# Patient Record
Sex: Male | Born: 1945 | Race: Black or African American | Hispanic: No | Marital: Single | Smoking: Former smoker
Health system: Southern US, Community
[De-identification: ages and names within clinical notes are randomized; demographics above are authoritative.]

## PROBLEM LIST (undated history)

## (undated) DIAGNOSIS — M1990 Unspecified osteoarthritis, unspecified site: Secondary | ICD-10-CM

## (undated) DIAGNOSIS — N186 End stage renal disease: Secondary | ICD-10-CM

## (undated) DIAGNOSIS — M86171 Other acute osteomyelitis, right ankle and foot: Secondary | ICD-10-CM

## (undated) DIAGNOSIS — S98111A Complete traumatic amputation of right great toe, initial encounter: Secondary | ICD-10-CM

## (undated) DIAGNOSIS — E119 Type 2 diabetes mellitus without complications: Secondary | ICD-10-CM

## (undated) DIAGNOSIS — I10 Essential (primary) hypertension: Secondary | ICD-10-CM

## (undated) DIAGNOSIS — Z992 Dependence on renal dialysis: Secondary | ICD-10-CM

## (undated) DIAGNOSIS — E785 Hyperlipidemia, unspecified: Secondary | ICD-10-CM

## (undated) HISTORY — PX: BACK SURGERY: SHX140

## (undated) HISTORY — PX: LUMBAR DISC SURGERY: SHX700

## (undated) HISTORY — DX: Hyperlipidemia, unspecified: E78.5

---
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
## 2004-03-18 ENCOUNTER — Encounter: Admission: RE | Admit: 2004-03-18 | Discharge: 2004-03-18 | Payer: Self-pay | Admitting: Family Medicine

## 2004-05-15 ENCOUNTER — Ambulatory Visit (HOSPITAL_COMMUNITY): Admission: RE | Admit: 2004-05-15 | Discharge: 2004-05-15 | Payer: Self-pay | Admitting: Gastroenterology

## 2005-09-16 ENCOUNTER — Ambulatory Visit (HOSPITAL_COMMUNITY): Admission: RE | Admit: 2005-09-16 | Discharge: 2005-09-17 | Payer: Self-pay | Admitting: Ophthalmology

## 2012-10-17 ENCOUNTER — Inpatient Hospital Stay (HOSPITAL_COMMUNITY): Payer: Medicare Other

## 2012-10-17 ENCOUNTER — Other Ambulatory Visit: Payer: Self-pay

## 2012-10-17 ENCOUNTER — Inpatient Hospital Stay (HOSPITAL_COMMUNITY)
Admission: EM | Admit: 2012-10-17 | Discharge: 2012-10-20 | DRG: 683 | Disposition: A | Discharge status: Home Health | Payer: Medicare Other | Attending: Internal Medicine | Admitting: Internal Medicine

## 2012-10-17 ENCOUNTER — Emergency Department (HOSPITAL_COMMUNITY): Payer: Medicare Other

## 2012-10-17 ENCOUNTER — Encounter (HOSPITAL_COMMUNITY): Payer: Self-pay | Admitting: *Deleted

## 2012-10-17 DIAGNOSIS — R809 Proteinuria, unspecified: Secondary | ICD-10-CM | POA: Diagnosis present

## 2012-10-17 DIAGNOSIS — E119 Type 2 diabetes mellitus without complications: Secondary | ICD-10-CM | POA: Diagnosis present

## 2012-10-17 DIAGNOSIS — E669 Obesity, unspecified: Secondary | ICD-10-CM | POA: Diagnosis present

## 2012-10-17 DIAGNOSIS — E162 Hypoglycemia, unspecified: Secondary | ICD-10-CM

## 2012-10-17 DIAGNOSIS — E1122 Type 2 diabetes mellitus with diabetic chronic kidney disease: Secondary | ICD-10-CM

## 2012-10-17 DIAGNOSIS — Z79899 Other long term (current) drug therapy: Secondary | ICD-10-CM

## 2012-10-17 DIAGNOSIS — N182 Chronic kidney disease, stage 2 (mild): Secondary | ICD-10-CM

## 2012-10-17 DIAGNOSIS — Z23 Encounter for immunization: Secondary | ICD-10-CM

## 2012-10-17 DIAGNOSIS — R4182 Altered mental status, unspecified: Secondary | ICD-10-CM

## 2012-10-17 DIAGNOSIS — I129 Hypertensive chronic kidney disease with stage 1 through stage 4 chronic kidney disease, or unspecified chronic kidney disease: Secondary | ICD-10-CM | POA: Diagnosis present

## 2012-10-17 DIAGNOSIS — N509 Disorder of male genital organs, unspecified: Secondary | ICD-10-CM | POA: Diagnosis not present

## 2012-10-17 DIAGNOSIS — Z6841 Body Mass Index (BMI) 40.0 and over, adult: Secondary | ICD-10-CM

## 2012-10-17 DIAGNOSIS — N183 Chronic kidney disease, stage 3 unspecified: Secondary | ICD-10-CM | POA: Diagnosis present

## 2012-10-17 DIAGNOSIS — F05 Delirium due to known physiological condition: Secondary | ICD-10-CM | POA: Diagnosis present

## 2012-10-17 DIAGNOSIS — N5082 Scrotal pain: Secondary | ICD-10-CM | POA: Diagnosis not present

## 2012-10-17 DIAGNOSIS — Z87891 Personal history of nicotine dependence: Secondary | ICD-10-CM

## 2012-10-17 DIAGNOSIS — N179 Acute kidney failure, unspecified: Principal | ICD-10-CM | POA: Diagnosis present

## 2012-10-17 DIAGNOSIS — Z794 Long term (current) use of insulin: Secondary | ICD-10-CM | POA: Diagnosis present

## 2012-10-17 DIAGNOSIS — R0902 Hypoxemia: Secondary | ICD-10-CM | POA: Diagnosis present

## 2012-10-17 DIAGNOSIS — E1169 Type 2 diabetes mellitus with other specified complication: Secondary | ICD-10-CM | POA: Diagnosis present

## 2012-10-17 DIAGNOSIS — I10 Essential (primary) hypertension: Secondary | ICD-10-CM | POA: Diagnosis present

## 2012-10-17 DIAGNOSIS — E1129 Type 2 diabetes mellitus with other diabetic kidney complication: Secondary | ICD-10-CM

## 2012-10-17 HISTORY — DX: Essential (primary) hypertension: I10

## 2012-10-17 LAB — CBC WITH DIFFERENTIAL/PLATELET
Basophils Relative: 0 % (ref 0–1)
Eosinophils Absolute: 0 10*3/uL (ref 0.0–0.7)
Eosinophils Relative: 0 % (ref 0–5)
HCT: 43.8 % (ref 39.0–52.0)
Hemoglobin: 14.8 g/dL (ref 13.0–17.0)
Lymphocytes Relative: 8 % — ABNORMAL LOW (ref 12–46)
Lymphs Abs: 0.9 10*3/uL (ref 0.7–4.0)
MCHC: 33.8 g/dL (ref 30.0–36.0)
MCV: 81 fL (ref 78.0–100.0)
Monocytes Absolute: 0.5 10*3/uL (ref 0.1–1.0)
RDW: 15.3 % (ref 11.5–15.5)

## 2012-10-17 LAB — URINALYSIS, ROUTINE W REFLEX MICROSCOPIC
Glucose, UA: 100 mg/dL — AB
Leukocytes, UA: NEGATIVE
Protein, ur: >300 mg/dL — AB
Urobilinogen, UA: 0.2 mg/dL (ref 0.0–1.0)

## 2012-10-17 LAB — COMPREHENSIVE METABOLIC PANEL
Alkaline Phosphatase: 139 U/L — ABNORMAL HIGH (ref 39–117)
BUN: 25 mg/dL — ABNORMAL HIGH (ref 6–23)
Calcium: 8.1 mg/dL — ABNORMAL LOW (ref 8.4–10.5)
Creatinine, Ser: 2.32 mg/dL — ABNORMAL HIGH (ref 0.50–1.35)
GFR calc Af Amer: 32 mL/min — ABNORMAL LOW (ref >90)
GFR calc non Af Amer: 28 mL/min — ABNORMAL LOW (ref >90)
Glucose, Bld: 53 mg/dL — ABNORMAL LOW (ref 70–99)
Total Bilirubin: 0.2 mg/dL — ABNORMAL LOW (ref 0.3–1.2)

## 2012-10-17 LAB — LIPASE, BLOOD: Lipase: 16 U/L (ref 11–59)

## 2012-10-17 LAB — GLUCOSE, CAPILLARY
Glucose-Capillary: 95 mg/dL (ref 70–99)
Glucose-Capillary: 111 mg/dL — ABNORMAL HIGH (ref 70–99)
Glucose-Capillary: 115 mg/dL — ABNORMAL HIGH (ref 70–99)
Glucose-Capillary: 133 mg/dL — ABNORMAL HIGH (ref 70–99)
Glucose-Capillary: 124 mg/dL — ABNORMAL HIGH (ref 70–99)

## 2012-10-17 LAB — URINE MICROSCOPIC-ADD ON

## 2012-10-17 LAB — PROTEIN / CREATININE RATIO, URINE
Creatinine, Urine: 76.27 mg/dL
Total Protein, Urine: 705.9 mg/dL

## 2012-10-17 MED ORDER — SODIUM CHLORIDE 0.9 % IV BOLUS (SEPSIS)
1000.0000 mL | Freq: Once | INTRAVENOUS | Status: AC
  Administered 2012-10-17: 1000 mL via INTRAVENOUS

## 2012-10-17 MED ORDER — ONDANSETRON HCL 4 MG/2ML IJ SOLN
4.0000 mg | Freq: Four times a day (QID) | INTRAMUSCULAR | Status: DC | PRN
  Administered 2012-10-18: 4 mg via INTRAVENOUS
  Filled 2012-10-17: qty 2

## 2012-10-17 MED ORDER — DEXTROSE-NACL 5-0.9 % IV SOLN
INTRAVENOUS | Status: DC
  Administered 2012-10-17 – 2012-10-18 (×2): via INTRAVENOUS

## 2012-10-17 MED ORDER — PNEUMOCOCCAL VAC POLYVALENT 25 MCG/0.5ML IJ INJ
0.5000 mL | INJECTION | INTRAMUSCULAR | Status: AC
  Administered 2012-10-18: 0.5 mL via INTRAMUSCULAR
  Filled 2012-10-17: qty 0.5

## 2012-10-17 MED ORDER — DEXTROSE 5 % IV SOLN
Freq: Once | INTRAVENOUS | Status: AC
  Administered 2012-10-17: 11:00:00 via INTRAVENOUS

## 2012-10-17 MED ORDER — INFLUENZA VIRUS VACC SPLIT PF IM SUSP
0.5000 mL | INTRAMUSCULAR | Status: AC
  Administered 2012-10-18: 0.5 mL via INTRAMUSCULAR
  Filled 2012-10-17: qty 0.5

## 2012-10-17 MED ORDER — ACETAMINOPHEN 325 MG PO TABS
650.0000 mg | ORAL_TABLET | Freq: Four times a day (QID) | ORAL | Status: DC | PRN
  Administered 2012-10-19: 650 mg via ORAL
  Filled 2012-10-17: qty 2

## 2012-10-17 MED ORDER — DEXTROSE 5 % IV SOLN: INTRAVENOUS | Status: AC

## 2012-10-17 MED ORDER — ENOXAPARIN SODIUM 40 MG/0.4ML ~~LOC~~ SOLN
40.0000 mg | SUBCUTANEOUS | Status: DC
  Administered 2012-10-17 – 2012-10-19 (×3): 40 mg via SUBCUTANEOUS
  Filled 2012-10-17 (×4): qty 0.4

## 2012-10-17 MED ORDER — ASPIRIN EC 81 MG PO TBEC
81.0000 mg | DELAYED_RELEASE_TABLET | Freq: Every day | ORAL | Status: DC
  Administered 2012-10-17 – 2012-10-20 (×4): 81 mg via ORAL
  Filled 2012-10-17 (×4): qty 1

## 2012-10-17 MED ORDER — SODIUM CHLORIDE 0.9 % IJ SOLN
3.0000 mL | Freq: Two times a day (BID) | INTRAMUSCULAR | Status: DC
  Administered 2012-10-17 – 2012-10-20 (×5): 3 mL via INTRAVENOUS

## 2012-10-17 MED ORDER — ACETAMINOPHEN 650 MG RE SUPP: 650.0000 mg | Freq: Four times a day (QID) | RECTAL | Status: DC | PRN

## 2012-10-17 MED ORDER — DEXTROSE 50 % IV SOLN
INTRAVENOUS | Status: AC
  Administered 2012-10-17: 50 mL
  Filled 2012-10-17: qty 50

## 2012-10-17 MED ORDER — AMLODIPINE BESYLATE 10 MG PO TABS
10.0000 mg | ORAL_TABLET | Freq: Every day | ORAL | Status: DC
  Administered 2012-10-17 – 2012-10-20 (×4): 10 mg via ORAL
  Filled 2012-10-17 (×4): qty 1

## 2012-10-17 MED ORDER — ALLOPURINOL 100 MG PO TABS
100.0000 mg | ORAL_TABLET | Freq: Every day | ORAL | Status: DC
  Administered 2012-10-17 – 2012-10-20 (×4): 100 mg via ORAL
  Filled 2012-10-17 (×4): qty 1

## 2012-10-17 MED ORDER — ONDANSETRON HCL 4 MG PO TABS
4.0000 mg | ORAL_TABLET | Freq: Four times a day (QID) | ORAL | Status: DC | PRN
  Administered 2012-10-20: 4 mg via ORAL
  Filled 2012-10-17: qty 1

## 2012-10-17 MED ORDER — BUPROPION HCL ER (XL) 300 MG PO TB24
300.0000 mg | ORAL_TABLET | Freq: Every day | ORAL | Status: DC
  Administered 2012-10-17 – 2012-10-19 (×3): 300 mg via ORAL
  Filled 2012-10-17 (×4): qty 1

## 2012-10-17 MED ORDER — ATORVASTATIN CALCIUM 20 MG PO TABS
20.0000 mg | ORAL_TABLET | Freq: Every day | ORAL | Status: DC
  Administered 2012-10-17 – 2012-10-20 (×4): 20 mg via ORAL
  Filled 2012-10-17 (×4): qty 1

## 2012-10-17 NOTE — ED Notes (Signed)
CBG 16, Dr. Vanita Panda at bedside. Ordered IV dextrose.

## 2012-10-17 NOTE — ED Notes (Signed)
Checked blood sugar it was 74 notified RN Wes of blood sugar

## 2012-10-17 NOTE — ED Notes (Signed)
Per [facility] report pt has been having generalized weakness for the last week.  Two days ago he has been having N/V.  This am family found him with a decreased LOC and a CBG of 43.  Pt was administered 1mg  of Glucagon due to combativeness.  EMS was then able to obtain a IV line and 25 gms of Dextrose was administered.  His CBG then responded favorably to 107.  Pt is A/ox 4 no distress but appears weak and sleepy.

## 2012-10-17 NOTE — ED Notes (Signed)
Pt a&ox4. Pt in nad. Respirations e/u, skin warm and dry.

## 2012-10-17 NOTE — ED Notes (Signed)
CBG 86. 

## 2012-10-17 NOTE — ED Notes (Signed)
Pt sitting up in bed eating

## 2012-10-17 NOTE — ED Notes (Signed)
CBG 109

## 2012-10-17 NOTE — ED Notes (Signed)
Patient transported to X-ray 

## 2012-10-17 NOTE — ED Notes (Signed)
Pt oxygen sats 89%, pt in nad. Denies sob. Pt placed on 2L/min [place]. O2 sats raised to 94%.

## 2012-10-17 NOTE — Progress Notes (Signed)
Pt voided 200 ml, Bladder scan completed. Greater than 126 ml resulted. MD paged and aware. Will continue to monitor.

## 2012-10-17 NOTE — ED Notes (Signed)
Per Dr. Marye Round - pt has suprapubic tenderness, wants post void residual done once he arrives on the floor upstairs.

## 2012-10-17 NOTE — ED Notes (Addendum)
Pt lethargic, unresponsive to verbal stimuli, Dr. Vanita Panda at bedside. Pt's skin sweaty, cool to touch and more pale than normal. CBG being checked. resp e/u.

## 2012-10-17 NOTE — ED Notes (Signed)
Pt reports he is feeling sleeping. Pt given sandwich and water.

## 2012-10-17 NOTE — ED Notes (Signed)
Pt finished 1/2 of sandwich.

## 2012-10-17 NOTE — H&P (Addendum)
[facility] History and Physical  Blake [name] [id number] DOB: 07/21/1946 DOA: 10/17/2012  Referring physician: ED  PCP: [name], MD    Chief Complaint:  Chief Complaint  Patient presents with  . Hypoglycemia  . Fatigue     HPI: Blake [name] is a 67 y.o. male With diabetes, hypertension was brought from home by EMS with altered mental status. When they arrived at his house he was having severe profound hypoglycemia with confusion and combativeness. He received glucagon IM and was transferred to the emergency room. In the emergency room the patient was found to have renal failure with hypoglycemia which persisted even after administration of D 50. Patient is a diabetic requiring long-acting insulin twice a day and his renal function has slowly deteriorated. He is referred for admission for acute renal failure and hypoglycemia   Review of Systems:  By the time I was going down to the emergency room the patient is awake and he denies any preceding symptoms on the day before the admission The patient denies anorexia, fever, weight loss,, vision loss, decreased hearing, hoarseness, chest pain, syncope, dyspnea on exertion, peripheral edema, balance deficits, hemoptysis, abdominal pain, melena, hematochezia, severe indigestion/heartburn, hematuria, incontinence, genital sores, muscle weakness, suspicious skin lesions, transient blindness, difficulty walking, depression, unusual weight change, abnormal bleeding, enlarged lymph nodes, angioedema, and breast masses.    Past Medical History  Diagnosis Date  . Diabetes mellitus without complication   . Hypertension    Past Surgical History  Procedure Date  . Back surgery    Social History:  reports that he has quit smoking. His smoking use included Cigarettes. He quit after 5 years of use. He has never used smokeless tobacco. He reports that he does not drink alcohol or use illicit drugs. Lives at home  independently  No Known Allergies  Family History  Problem Relation Age of Onset  . Diabetes Father     Prior to Admission medications   Medication Sig Start Date End Date Taking? Authorizing Provider  allopurinol (ZYLOPRIM) 300 MG tablet Take 300 mg by mouth daily.   Yes Historical Provider, MD  amLODipine (NORVASC) 10 MG tablet Take 10 mg by mouth daily.   Yes Historical Provider, MD  aspirin EC 81 MG tablet Take 81 mg by mouth daily.   Yes Historical Provider, MD  atorvastatin (LIPITOR) 20 MG tablet Take 20 mg by mouth daily.   Yes Historical Provider, MD  bisoprolol (ZEBETA) 10 MG tablet Take 10 mg by mouth daily.   Yes Historical Provider, MD  buPROPion (WELLBUTRIN XL) 150 MG 24 hr tablet Take 300 mg by mouth daily.   Yes Historical Provider, MD  furosemide (LASIX) 40 MG tablet Take 40 mg by mouth 2 (two) times daily.   Yes Historical Provider, MD  insulin glargine (LANTUS) 100 UNIT/ML injection Inject 20-70 Units into the skin 2 (two) times daily. 20 units every morning and 70 units at bedtime   Yes Historical Provider, MD  insulin lispro (HUMALOG) 100 UNIT/ML injection Inject 5 Units into the skin 3 (three) times daily before meals.   Yes Historical Provider, MD  lisinopril (PRINIVIL,ZESTRIL) 30 MG tablet Take 30 mg by mouth daily.   Yes Historical Provider, MD   Physical Exam: Filed Vitals:   10/17/12 1200 10/17/12 1230 10/17/12 1315 10/17/12 1450  BP: 144/83 135/78 104/55 165/84  Pulse: 53 56 67 69  Temp:    98 F (36.7 C)  TempSrc:    Oral  Resp: 20 16  17 18  SpO2: 97% 99% 97% 100%     General:  Alert and oriented x3  Eyes: Pupil equal round react to light accommodation, extraocular movement is intact  ENT: Clear pharynx without exudates  Neck: No jugular venous distention  Cardiovascular: Regular rate and rhythm without murmurs rubs or gallops  Respiratory: Clear to auscultation bilaterally without wheezes rhonchi crackles  Abdomen: Obese soft, patient has  suprapubic tenderness  Skin: No suspicious looking rashes  Musculoskeletal: Intact muscle bulk and tone  Psychiatric: Euthymic  Neurologic: Cranial nerves 2-12 intact, strength 5 out of 5 in all 4 extremities, sensation intact  Labs on Admission:  Basic Metabolic Panel:  Lab [id number] 0814  NA 138  K 3.5  CL 105  CO2 23  GLUCOSE 53*  BUN 25*  CREATININE 2.32*  CALCIUM 8.1*  MG --  PHOS --   Liver Function Tests:  Lab 10/17/12 0814  AST 21  ALT 12  ALKPHOS 139*  BILITOT 0.2*  PROT 6.5  ALBUMIN 2.2*    Lab 10/17/12 0814  LIPASE 16  AMYLASE --   No results found for this basename: AMMONIA:5 in the last 168 hours CBC:  Lab 10/17/12 0814  WBC 10.5  NEUTROABS 9.1*  HGB 14.8  HCT 43.8  MCV 81.0  PLT 249   Cardiac Enzymes: No results found for this basename: CKTOTAL:5,CKMB:5,CKMBINDEX:5,TROPONINI:5 in the last 168 hours  BNP (last 3 results) No results found for this basename: PROBNP:3 in the last 8760 hours CBG:  Lab 10/17/12 1623 10/17/12 1538 10/17/12 1456 10/17/12 1412 10/17/12 1215  GLUCAP 115* 111* 95 110* 109*    Radiological Exams on Admission: Dg Chest 2 View  10/17/2012  *RADIOLOGY REPORT*  Clinical Data: Discomfort, low blood sugar  CHEST - 2 VIEW  Comparison: 09/16/2005  Findings: Limited inspiratory effect.  Even allowing for this, there does appear to be interval increase in cardiac size with currently mild to moderate cardiac silhouette enlargement.  There is indistinct perihilar vasculature with vascular congestion and mild interstitial process bilaterally.  No consolidation or pleural effusion.  IMPRESSION: Cardiomegaly versus pericardial effusion.  Findings suggest mild pulmonary edema.   Original Report Authenticated By: Skipper Cliche, M.D.     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 Acute acute renal  failure versus progression of chronic kidney disease. The patient also on ACE inhibitors prior to admission. No clinical concern for dehydration. We'll need to establish baseline creatinine which could be done from the primary care physician office tomorrow. We need to obtain protein/creatinine ratio to quantify the degree of proteinuria. To obtain a postvoid residual to rule out urinary retention. We'll obtain a renal ultrasound to look for the morphology of the kidneys. We will hold the ACE inhibitors, give gental hydration And recheck creatinine tomorrow 2. Severe hypoglycemia in the setting of long acting insulin and worsening renal failure. Plan for IV dextrose until glucose is more than 200. Monitor CBGs every 2 hours. To resume long-acting insulin at a lower dose2/5/14 3. Hypertension - We will continue amlodipine. Patient is very bradycardic so will stop Zebeta for now. We will stop ACE inhibitors as discussed above 4. Hypoxia and vascular congestion on chest x-ray - suspect measurements and chest x-ray were done at the time of altered mental status for from profound hypoglycemia. I do suspect the patient's body habitus created a situation of  poor expansion of the lungs. The patient does not have clinical pulmonary edema. The patient has no respiratory distress. We will observe and repeat oxygen saturation on room air tomorrow   Time spent: 45 minutes  Faiga Stones [facility] Pager [phone]  If 7PM-7AM, please contact night-coverage [email or web address] Password [facility] 10/17/2012, 5:41 PM

## 2012-10-17 NOTE — ED Provider Notes (Signed)
History     CSN: [id number]  Arrival date & time 10/17/12  0804   First MD Initiated Contact with Patient 10/17/12 [phone]      Chief Complaint  Patient presents with  . Hypoglycemia  . Fatigue    (Consider location/radiation/quality/duration/timing/severity/associated sxs/prior treatment) HPI  Patient complains of weakness, generalized fatigue.  He states that his symptoms began yesterday, without clear precipitant.  He states that he has been compliant with all medication, with no dosage or frequency changes recently. He denies focal pain.  He states that he has intermittent lightheadedness, but no syncope, no chest pain, no dyspnea, no vomiting. This is different from EMS report.  Per EMS family reports that over the past week patient has had generalized weakness, with occasional episodes of emesis. Per EMS the patient was found listless, with a CBG of 43 on their arrival.  Patient seemed to respond to dextrose.   Past Medical History  Diagnosis Date  . Diabetes mellitus without complication   . Hypertension     No past surgical history on file.  No family history on file.  History  Substance Use Topics  . Smoking status: Not on file  . Smokeless tobacco: Not on file  . Alcohol Use:       Review of Systems  Constitutional:       Per HPI, otherwise negative  HENT:       Per HPI, otherwise negative  Eyes: Negative.   Respiratory:       Per HPI, otherwise negative  Cardiovascular:       Per HPI, otherwise negative  Gastrointestinal: Negative for vomiting.  Genitourinary: Negative.   Musculoskeletal:       Per HPI, otherwise negative  Skin: Negative.   Neurological: Negative for syncope.    Allergies  Review of patient's allergies indicates no known allergies.  Home Medications   Current Outpatient Rx  Name  Route  Sig  Dispense  Refill  . ALLOPURINOL 300 MG PO TABS   Oral   Take 300 mg by mouth daily.         Marland Kitchen AMLODIPINE BESYLATE 10 MG PO TABS  Oral   Take 10 mg by mouth daily.         . ASPIRIN EC 81 MG PO TBEC   Oral   Take 81 mg by mouth daily.         . ATORVASTATIN CALCIUM 20 MG PO TABS   Oral   Take 20 mg by mouth daily.         Marland Kitchen BISOPROLOL FUMARATE 10 MG PO TABS   Oral   Take 10 mg by mouth daily.         . BUPROPION HCL ER (XL) 150 MG PO TB24   Oral   Take 300 mg by mouth daily.         . FUROSEMIDE 40 MG PO TABS   Oral   Take 40 mg by mouth 2 (two) times daily.         . INSULIN GLARGINE 100 UNIT/ML [place] SOLN   Subcutaneous   Inject 20-70 Units into the skin 2 (two) times daily. 20 units every morning and 70 units at bedtime         . INSULIN LISPRO (HUMAN) 100 UNIT/ML [place] SOLN   Subcutaneous   Inject 5 Units into the skin 3 (three) times daily before meals.         Marland Kitchen LISINOPRIL 30 MG PO TABS  Oral   Take 30 mg by mouth daily.           BP 144/75  Pulse 63  Temp 97.8 F (36.6 C) (Oral)  Resp 18  SpO2 98%  Physical Exam  Nursing note and vitals reviewed. Constitutional: He is oriented to person, place, and time. He appears well-developed. No distress.  HENT:  Head: Normocephalic and atraumatic.  Eyes: Conjunctivae normal and EOM are normal.  Cardiovascular: Normal rate and regular rhythm.   Pulmonary/Chest: Effort normal. No stridor. No respiratory distress.  Abdominal: He exhibits no distension.  Musculoskeletal: He exhibits no edema.  Neurological: He is alert and oriented to person, place, and time.  Skin: Skin is warm and dry.  Psychiatric: He has a normal mood and affect.    ED Course  Procedures (including critical care time)   With initial consideration of sepsis versus metabolic abnormalities, patient was resuscitated with IV fluids after initial evaluation.   Labs Reviewed  GLUCOSE, CAPILLARY  CBC WITH DIFFERENTIAL  COMPREHENSIVE METABOLIC PANEL  LIPASE, BLOOD  URINALYSIS, ROUTINE W REFLEX MICROSCOPIC  TROPONIN I   No results found.   No diagnosis  found.  Cardiac 65 sinus rhythm normal Pulse ox 99% room air normal   Date: 10/17/2012  Rate: 66  Rhythm: normal sinus rhythm  QRS Axis: normal  Intervals: normal  ST/T Wave abnormalities: normal  Conduction Disutrbances: none  Narrative Interpretation: unremarkable   Update: Patient sitting upright, eating   10:45 AM Patient stupporous.  CBG 16 - Dextrose ordered.  Drip starting  Initial labs demonstrate new renal failure.  11:00 AM Patient more awake. He denies renal failure, but states that he has been evaluated for this previously.  MDM  This patient with insulin-dependent diabetes presents after family members noticed increasing listlessness, altered mental status in the past few days, the patient only describes complaint over the past day.  On EMS arrival the patient had hypoglycemia.  After initial evaluation here, the patient had an additional episode of stupor, and hypoglycemia.  Given the altered mental status, hypoglycemia, acute renal failure, after the patient was initially resuscitated with IV fluids, he also received dextrose infusion continuously.  With clinical improvement here following dextrose, the patient was admitted to telemetry for further evaluation, monitoring of new renal failure, and new altered mental status with hypoglycemia, with suspicion of the patient's renal dysfunction, continued consistent insulin dosing, decreased by mouth intake all as contributory factors to his episodes.   CRITICAL CARE Performed by: Carmin Muskrat   Total critical care time: 35  Critical care time was exclusive of separately billable procedures and treating other patients.  Critical care was necessary to treat or prevent imminent or life-threatening deterioration.  Critical care was time spent personally by me on the following activities: development of treatment plan with patient and/or surrogate as well as nursing, discussions with consultants, evaluation of  patient's response to treatment, examination of patient, obtaining history from patient or surrogate, ordering and performing treatments and interventions, ordering and review of laboratory studies, ordering and review of radiographic studies, pulse oximetry and re-evaluation of patient's condition.    Carmin Muskrat, MD 10/17/12 1122

## 2012-10-18 DIAGNOSIS — R0902 Hypoxemia: Secondary | ICD-10-CM

## 2012-10-18 DIAGNOSIS — E162 Hypoglycemia, unspecified: Secondary | ICD-10-CM

## 2012-10-18 LAB — GLUCOSE, CAPILLARY
Glucose-Capillary: 107 mg/dL — ABNORMAL HIGH (ref 70–99)
Glucose-Capillary: 84 mg/dL (ref 70–99)
Glucose-Capillary: 53 mg/dL — ABNORMAL LOW (ref 70–99)
Glucose-Capillary: 105 mg/dL — ABNORMAL HIGH (ref 70–99)
Glucose-Capillary: 255 mg/dL — ABNORMAL HIGH (ref 70–99)
Glucose-Capillary: 265 mg/dL — ABNORMAL HIGH (ref 70–99)
Glucose-Capillary: 221 mg/dL — ABNORMAL HIGH (ref 70–99)
Glucose-Capillary: 183 mg/dL — ABNORMAL HIGH (ref 70–99)

## 2012-10-18 LAB — CBC
Hemoglobin: 12.9 g/dL — ABNORMAL LOW (ref 13.0–17.0)
Platelets: 227 10*3/uL (ref 150–400)
RBC: 4.89 MIL/uL (ref 4.22–5.81)
WBC: 8.6 10*3/uL (ref 4.0–10.5)

## 2012-10-18 LAB — BASIC METABOLIC PANEL WITH GFR
BUN: 27 mg/dL — ABNORMAL HIGH (ref 6–23)
CO2: 25 meq/L (ref 19–32)
Calcium: 7.7 mg/dL — ABNORMAL LOW (ref 8.4–10.5)
Chloride: 109 meq/L (ref 96–112)
Creatinine, Ser: 2.47 mg/dL — ABNORMAL HIGH (ref 0.50–1.35)
GFR calc Af Amer: 30 mL/min — ABNORMAL LOW
GFR calc non Af Amer: 26 mL/min — ABNORMAL LOW
Glucose, Bld: 73 mg/dL (ref 70–99)
Potassium: 3.5 meq/L (ref 3.5–5.1)
Sodium: 143 meq/L (ref 135–145)

## 2012-10-18 LAB — URINE CULTURE
Colony Count: NO GROWTH
Culture: NO GROWTH

## 2012-10-18 MED ORDER — GI COCKTAIL ~~LOC~~
30.0000 mL | Freq: Once | ORAL | Status: AC
  Administered 2012-10-18: 30 mL via ORAL
  Filled 2012-10-18 (×2): qty 30

## 2012-10-18 MED ORDER — INSULIN ASPART 100 UNIT/ML ~~LOC~~ SOLN: 0.0000 [IU] | Freq: Every day | SUBCUTANEOUS | Status: DC

## 2012-10-18 MED ORDER — INSULIN ASPART 100 UNIT/ML ~~LOC~~ SOLN
0.0000 [IU] | Freq: Three times a day (TID) | SUBCUTANEOUS | Status: DC
  Administered 2012-10-18: 3 [IU] via SUBCUTANEOUS
  Administered 2012-10-19 (×2): 2 [IU] via SUBCUTANEOUS
  Administered 2012-10-19: 3 [IU] via SUBCUTANEOUS
  Administered 2012-10-20 (×3): 2 [IU] via SUBCUTANEOUS

## 2012-10-18 NOTE — Clinical Documentation Improvement (Signed)
BMI DOCUMENTATION CLARIFICATION QUERY  THIS DOCUMENT IS NOT A PERMANENT PART OF THE MEDICAL RECORD  TO RESPOND TO THE THIS QUERY, FOLLOW THE INSTRUCTIONS BELOW:  1. If needed, update documentation for the patient's encounter via the notes activity.  2. Access this query again and click edit on the [employer] [place] [place].  3. After updating, or not, click F2 to complete all highlighted (required) fields concerning your review. Select "additional documentation in the medical record" OR "no additional documentation provided".  4. Click Sign note button.  5. The deficiency will fall out of your In Basket *Please let us know if you are not able to complete this workflow by phone or e-mail (listed below).         10/18/12  Dear Dr. Algis Liming Rolley Sims  In an effort to better capture your patient's severity of illness, reflect appropriate length of stay and utilization of resources, a review of the patient medical record has revealed the following indicators.    Based on your clinical judgment, please clarify and document in a progress note and/or discharge summary the clinical condition associated with the following supporting information:  In responding to this query please exercise your independent judgment.  The fact that a query is asked, does not imply that any particular answer is desired or expected.   Possible Clinical conditions:  Morbid Obesity W/ BMI= 41.8  Other condition  Cannot Clinically determine     Sign & Symptoms: Weight: 274lb,8oz Height: 5'8" BMI= 41.8   Reviewed: additional documentation in the medical record  Thank You,  Theron Arista,  Clinical Documentation Specialist:  Pager: [phone]  Phone: [place]

## 2012-10-18 NOTE — Progress Notes (Signed)
[facility] PROGRESS NOTE  Blake [name] [id number] DOB: 17-Jan-1946 DOA: 10/17/2012 PCP: [name], MD  Brief narrative 67 y.o. male with diabetes, hypertension was brought from home by EMS with altered mental status. When they arrived at his house he was having severe profound hypoglycemia with confusion and combativeness. He received glucagon IM and was transferred to the emergency room. In the emergency room the patient was found to have renal failure with hypoglycemia which persisted even after administration of D 50. Patient is a diabetic requiring long-acting insulin twice a day and his renal function has slowly deteriorated. He is referred for admission for acute renal failure and hypoglycemia  Assessment/Plan: 1. Hypoglycemia in patient with insulin-dependent DM 2:? Secondary to declining renal function. Patient gives history of symptomatic hypoglycemia ongoing for a long time but has not been checking CBGs for at least 2 years due to financial constraints. He does however takes his insulins and eats something sweet such as candy when he has symptoms. Insulins were held, patient was based on IV dextrose water-his CBGs are starting to rise. DC D5W. Continue to monitor CBCs closely and start sensitive sliding scale insulin and monitor closely. Resume Lantus at lower dose when blood sugars stable and not dropping. 2. Acute on chronic kidney disease versus chronic kidney disease stage III: Creatinine not significantly changed from yesterday. We'll attempt to obtain results from PCP/nephrologist. Patient has seen [facility]. No hydronephrosis. 3. Hypertension: Fluctuating and mildly uncontrolled. Continue amlodipine. Zepeda held secondary to bradycardia. ACE inhibitors also held secondary to problem #2. 4. Hypoxemia: Possibly secondary to altered mental status and poor effort. Monitor. May need to rule out OSA as outpatient.  Code Status: Full Family  Communication: Discussed with patient. Disposition Plan: Home when medically stable.   Consultants:  None  Procedures:  None  Antibiotics:  None   HPI/Subjective: Feels much better. Gives long history of periodic symptoms suggestive of hypoglycemia.  Objective: Filed Vitals:   10/17/12 2017 10/18/12 0518 10/18/12 1037 10/18/12 1436  BP: 166/74 159/78 132/60 149/64  Pulse: 66 68 70 92  Temp: 98.2 F (36.8 C) 97.8 F (36.6 C)  98.2 F (36.8 C)  TempSrc: Oral Oral  Oral  Resp: 16 17  20   Height:      Weight:      SpO2: 97% 98%  100%    Intake/Output Summary (Last 24 hours) at 10/18/12 1704 Last data filed at 10/18/12 1300  Gross per 24 hour  Intake   1320 ml  Output   1750 ml  Net   -430 ml   Filed Weights   10/17/12 1700  Weight: 124.512 kg (274 lb 8 oz)    Exam:   General exam: Obese male lying comfortably in bed.  Respiratory system: Clear. No increased work of breathing.  Cardiovascular system: S1 and S2 heard, RRR. Telemetry shows sinus rhythm. No JVD or murmurs. Trace bilateral ankle edema.  Gastrointestinal system: Abdomen is obese, soft and nontender. Normal bowel sounds heard.  Central nervous system: Alert and oriented. No focal neurological deficits  Extremities: Symmetric 5 x 5 power.   Data Reviewed: Basic Metabolic Panel:  Lab [id number] 0520 10/17/12 0814  NA 143 138  K 3.5 3.5  CL 109 105  CO2 25 23  GLUCOSE 73 53*  BUN 27* 25*  CREATININE 2.47* 2.32*  CALCIUM 7.7* 8.1*  MG -- --  PHOS -- --   Liver Function Tests:  Lab 10/17/12 0814  AST 21  ALT 12  ALKPHOS 139*  BILITOT 0.2*  PROT 6.5  ALBUMIN 2.2*    Lab 10/17/12 0814  LIPASE 16  AMYLASE --   No results found for this basename: AMMONIA:5 in the last 168 hours CBC:  Lab 10/18/12 0520 10/17/12 0814  WBC 8.6 10.5  NEUTROABS -- 9.1*  HGB 12.9* 14.8  HCT 39.8 43.8  MCV 81.4 81.0  PLT 227 249   Cardiac Enzymes:  Lab 10/17/12 2212  CKTOTAL --  CKMB --   CKMBINDEX --  TROPONINI <0.30   BNP (last 3 results) No results found for this basename: PROBNP:3 in the last 8760 hours CBG:  Lab 10/18/12 1150 10/18/12 1000 10/18/12 0823 10/18/12 0704 10/18/12 0647  GLUCAP 265* 255* 135* 105* 115*    Recent Results (from the past 240 hour(s))  URINE CULTURE     Status: Normal   Collection Time   10/17/12  8:28 AM      Component Value Range Status Comment   Specimen Description URINE, RANDOM   Final    Special Requests ADDED [id number]   Final    Culture  Setup Time 10/17/2012 12:14   Final    [place] Count NO GROWTH   Final    Culture NO GROWTH   Final    Report Status 10/18/2012 FINAL   Final      Studies: Dg Chest 2 View  10/17/2012  *RADIOLOGY REPORT*  Clinical Data: Discomfort, low blood sugar  CHEST - 2 VIEW  Comparison: 09/16/2005  Findings: Limited inspiratory effect.  Even allowing for this, there does appear to be interval increase in cardiac size with currently mild to moderate cardiac silhouette enlargement.  There is indistinct perihilar vasculature with vascular congestion and mild interstitial process bilaterally.  No consolidation or pleural effusion.  IMPRESSION: Cardiomegaly versus pericardial effusion.  Findings suggest mild pulmonary edema.   Original Report Authenticated By: Skipper Cliche, M.D.    US Renal  10/17/2012  *RADIOLOGY REPORT*  Clinical Data: Renal failure.  Diabetic hypertensive patient.  RENAL/URINARY TRACT ULTRASOUND COMPLETE  Comparison:  None.  Findings:  Right Kidney:  11.1 cm. No hydronephrosis or renal mass.  Left Kidney:  11.8 cm. No hydronephrosis or renal mass.  Bladder:  No gross abnormality.  IMPRESSION: No hydronephrosis.  Dromedary configuration noted bilaterally.   Original Report Authenticated By: Genia Del, M.D.     Scheduled Meds:    . allopurinol  100 mg Oral Daily  . amLODipine  10 mg Oral Daily  . aspirin EC  81 mg Oral Daily  . atorvastatin  20 mg Oral Daily  . buPROPion  300 mg Oral  Daily  . enoxaparin (LOVENOX) injection  40 mg Subcutaneous Q24H  . influenza  inactive virus vaccine  0.5 mL Intramuscular Tomorrow-1000  . pneumococcal 23 valent vaccine  0.5 mL Intramuscular Tomorrow-1000  . 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 If 8PM-8AM, please contact night-coverage at [email or web address], password [email or web address] 10/18/2012, 5:04 PM  LOS: 1 day

## 2012-10-18 NOTE — Clinical Documentation Improvement (Signed)
CHANGE MENTAL STATUS DOCUMENTATION CLARIFICATION   THIS DOCUMENT IS NOT A PERMANENT PART OF THE MEDICAL RECORD  TO RESPOND TO THE THIS QUERY, FOLLOW THE INSTRUCTIONS BELOW:  1. If needed, update documentation for the patient's encounter via the notes activity.  2. Access this query again and click edit on the In [place].  3. After updating, or not, click F2 to complete all highlighted (required) fields concerning your review. Select "additional documentation in the medical record" OR "no additional documentation provided".  4. Click Sign note button.  5. The deficiency will fall out of your In Basket *Please let us know if you are not able to complete this workflow by phone or e-mail (listed below).         10/18/12  Dear Dr. Algis Liming Rolley Sims  In an effort to better capture your patient's severity of illness, reflect appropriate length of stay and utilization of resources, a review of the patient medical record has revealed the following indicators.    Based on your clinical judgment, please clarify and document in a progress note and/or discharge summary the clinical condition associated with the following supporting information:  In responding to this query please exercise your independent judgment.  The fact that a query is asked, does not imply that any particular answer is desired or expected.   Possible Clinical Conditions?  ___Encephalopathy (describe type if known)                       Anoxic                       Septic                       Alcoholic                        Hepatic                       Hypertensive                       Metabolic                       Toxic  ___Drug induced confusion/delirium ___Acute confusion ___Acute delirium ___Acute exacerbation of known dementia (indicate type) ___New diagnosis of Dementia, Alzheimer's, cerebral atherosclerosis  ___Other Condition ___Cannot Clinically Determine     Risk Factors: AMS, confusion,  combativeness noted per 02/04 progress notes.   Reviewed: additional documentation in the medical record  Thank Bertram Gala  Clinical Documentation Specialist:  Pager: [phone]  Phone: [place]

## 2012-10-18 NOTE — Progress Notes (Signed)
Inpatient Diabetes Program Recommendations  AACE/ADA: New Consensus Statement on Inpatient Glycemic Control (2013)  Target Ranges:  Prepandial:   less than 140 mg/dL      Peak postprandial:   less than 180 mg/dL (1-2 hours)      Critically ill patients:  140 - 180 mg/dL   Admitted with AMS/Hypoglycemia/Acute renal failure.  Takes the following insulin at home: Lantus 20 units in the AM/ 70 units in the PM Humalog 5 units tid with meals  Patient may need a reduction of his long-acting insulin for home if he continues to have issues with his renal function.  Will be at higher risk for hypoglycemia with elevated creatinine.  Noted CBGs now on the rise as of 12noon (CBG was 265 mg/dl).  May want to add Novolog Sensitive correction scale (SSI). Please change diet to Carbohydrate Modified diet (currently has Regular diet with no carbohydrate restrictions).  Will follow. Wyn Quaker RN, MSN, CDE Diabetes Coordinator Inpatient Diabetes Program [phone]

## 2012-10-18 NOTE — Progress Notes (Signed)
Utilization Review Completed.Blake [name] T2/01/2013   

## 2012-10-19 ENCOUNTER — Inpatient Hospital Stay (HOSPITAL_COMMUNITY): Payer: Medicare Other

## 2012-10-19 DIAGNOSIS — N509 Disorder of male genital organs, unspecified: Secondary | ICD-10-CM

## 2012-10-19 DIAGNOSIS — N5082 Scrotal pain: Secondary | ICD-10-CM | POA: Diagnosis not present

## 2012-10-19 DIAGNOSIS — N183 Chronic kidney disease, stage 3 unspecified: Secondary | ICD-10-CM

## 2012-10-19 LAB — GLUCOSE, CAPILLARY: Glucose-Capillary: 159 mg/dL — ABNORMAL HIGH (ref 70–99)

## 2012-10-19 LAB — BASIC METABOLIC PANEL
CO2: 26 mEq/L (ref 19–32)
Calcium: 7.5 mg/dL — ABNORMAL LOW (ref 8.4–10.5)
Creatinine, Ser: 2.43 mg/dL — ABNORMAL HIGH (ref 0.50–1.35)
GFR calc Af Amer: 30 mL/min — ABNORMAL LOW (ref >90)
Sodium: 141 mEq/L (ref 135–145)

## 2012-10-19 MED ORDER — HYDROMORPHONE HCL PF 1 MG/ML IJ SOLN: 0.5000 mg | INTRAMUSCULAR | Status: DC | PRN

## 2012-10-19 MED ORDER — PANTOPRAZOLE SODIUM 40 MG PO TBEC
40.0000 mg | DELAYED_RELEASE_TABLET | Freq: Every day | ORAL | Status: DC
  Administered 2012-10-19 – 2012-10-20 (×2): 40 mg via ORAL
  Filled 2012-10-19 (×2): qty 1

## 2012-10-19 MED ORDER — ALUM & MAG HYDROXIDE-SIMETH 200-200-20 MG/5ML PO SUSP
15.0000 mL | Freq: Four times a day (QID) | ORAL | Status: DC | PRN
  Administered 2012-10-19: 15 mL via ORAL
  Filled 2012-10-19 (×2): qty 30

## 2012-10-19 MED ORDER — HYDROCODONE-ACETAMINOPHEN 5-325 MG PO TABS
1.0000 | ORAL_TABLET | Freq: Four times a day (QID) | ORAL | Status: DC | PRN
  Administered 2012-10-19: 1 via ORAL
  Filled 2012-10-19: qty 2

## 2012-10-19 NOTE — Care Management Note (Addendum)
    Page 1 of 2   10/20/2012     3:27:31 PM   CARE MANAGEMENT NOTE 10/20/2012  Patient:  Blake [name], Blake [name]   Account Number:  [id number]  Date Initiated:  10/19/2012  Documentation initiated by:  Kila Godina  Subjective/Objective Assessment:   PT ADM ON 10/17/12 WITH AMS, BLOOD GLUCOSE OF 16.  PTA, PT INDEPENDENT, LIVES WITH NIECE.     Action/Plan:   MET WITH PT TO DISCUSS COMPLIANCE AND FINANCIAL ISSUES WITH MEDICATIONS.   Anticipated DC Date:  10/20/2012   Anticipated DC Plan:  [place]  CM consult      [facility] Choice  HOME HEALTH   Choice offered to / List presented to:  C-1 Patient        [place] arranged  HH-1 RN      [place].   Status of service:  Completed, signed off Medicare Important Message given?   (If response is "NO", the following Medicare IM given date fields will be blank) Date Medicare IM given:   Date Additional Medicare IM given:    Discharge Disposition:  [place]  Per UR Regulation:  Reviewed for med. necessity/level of care/duration of stay  If discussed at [place] of Stay Meetings, dates discussed:    Comments:  10/20/12 Antoine Primas [id number] PT [place].  WILL NEED HHRN FOR DIABETES EDUCATION AND FOLLOW UP.  REFERRAL TO [facility], PER PT CHOICE. START OF CARE 24-48H POST DC DATE.  10/19/12 Carlisha Wisler,RN,BSN [id number] PT STATES HE DOES HAVE INSURANCE, BUT IS HAVING TROUBLE AFFORDING DIABETIC SUPPLIES.  HE STATES HE RECEIVES APPROX $900/MONTH FROM MEDICARE, AND SPENDS ABOUT $100 OF IT ON MEDICATIONS.  HE FEELS THAT DIABETIC SUPPLIES ARE TOO EXPENSIVE, AND HAS BEEN REUSING NEEDLES TO SAVE MONEY. EDUCATED PT ON REASONS NOT TO REUSE NEEDLES.  HE VERBALIZES UNDERSTANDING OF THIS, BUT JUST SEEMS FRUSTRATED ABOUT HAVING TO PAY FOR DIABETIC SUPPLIES AT ALL.  REINFORCED IMPORTANCE OF CHECKING CBGS PRIOR TO TAKING INSULIN, AND RECOMMENDED RELI-ON METER AT  [facility], THAT IS FAIRLY INEXPENSIVE, (LESS THAN $20) AND STRIPS ARE AFFORDABLE. ALSO PROVIDED PT WITH RX OUTREACH APPLICATION--WITH THIS PROGRAM HE CAN RECEIVE A FREE METER, BOX OF 50 STRIPS FOR $50, AND LANCETS 100 FOR $5 PER BOX. WOULD RECOMMEND OUTPATIENT DIABETIC TEACHING FOR THIS PATIENT, AS HE SEEMS TO NEED EXTENSIVE EDUCATION FO DIABETES MGMT.

## 2012-10-19 NOTE — Progress Notes (Signed)
[facility] PROGRESS NOTE  Blake [name] [id number] DOB: 04-14-1946 DOA: 10/17/2012 PCP: [name], MD  Brief narrative 68 y.o. male with diabetes, hypertension was brought from home by EMS with altered mental status. When they arrived at his house he was having severe profound hypoglycemia with confusion and combativeness. He received glucagon IM and was transferred to the emergency room. In the emergency room the patient was found to have renal failure with hypoglycemia which persisted even after administration of D 50. Patient is a diabetic requiring long-acting insulin twice a day and his renal function has slowly deteriorated. He is referred for admission for acute renal failure and hypoglycemia  Assessment/Plan: 1. Hypoglycemia in patient with insulin-dependent DM 2:? Secondary to declining renal function and insulins. Patient gives history of symptomatic hypoglycemia ongoing for a long time but has not been checking CBGs for at least 2 years due to financial constraints. He does however takes his insulins and eats something sweet such as candy when he has symptoms. Insulins were held, patient was placed on IV dextrose water-hypoglycemia has resolved. Patient was started on sliding scale insulin. CBGs are ranging in the 150s. Discussed with his primary endocrinologist Dr. Wallace Cullens apparently has not seen her or his PCP in 2 years. Per her records, patient has history of noncompliance and A1c with them in 2012 was 11 and he had nephrotic range proteinuria. Creatinine in July 2012: 1.9. She recommended discontinuing Lantus chronic kidney disease and stacking effect of Lantus. She recommended Humulin N 20 units daily, SSI and outpatient followup with her. Hold off on blood sugars continue to rise. 2. Scrotal pain: Unclear etiology. No acute findings on exam. Will get scrotal ultrasound. Pain medications and monitor closely. 3. Chronic kidney disease versus chronic kidney  disease stage III: No hydronephrosis. Creatinine in July 2012 was 1.9. So this is likely progressive chronic kidney disease. Creatinine stable. 4. Hypertension: Fluctuating and mildly uncontrolled. Continue amlodipine. Zepeda held secondary to bradycardia. ACE inhibitors also held secondary to problem #2-may consider resuming. 5. Hypoxemia: Possibly secondary to altered mental status and poor effort. Monitor. May need to rule out OSA as outpatient.  Code Status: Full Family Communication: Discussed with patient. Disposition Plan: Home when medically stable.   Consultants:  None  Procedures:  None  Antibiotics:  None   HPI/Subjective: Mild nausea. Scrotal pain. Denies trauma. Per nursing, patient was in lying in bed without a sheet.   Objective: Filed Vitals:   10/18/12 1436 10/18/12 2047 10/19/12 0624 10/19/12 1426  BP: 149/64 163/67 157/82 155/67  Pulse: 92 83 78 82  Temp: 98.2 F (36.8 C) 98.4 F (36.9 C) 98.2 F (36.8 C) 98.3 F (36.8 C)  TempSrc: Oral Oral Oral Oral  Resp: 20 19 18 18   Height:      Weight:   124.8 kg (275 lb 2.2 oz)   SpO2: 100% 97% 93% 90%    Intake/Output Summary (Last 24 hours) at 10/19/12 1528 Last data filed at 10/19/12 1200  Gross per 24 hour  Intake    480 ml  Output   1425 ml  Net   -945 ml   Filed Weights   10/17/12 1700 10/19/12 0624  Weight: 124.512 kg (274 lb 8 oz) 124.8 kg (275 lb 2.2 oz)    Exam:   General exam:  sitting comfortably on chair he   Respiratory system: Clear. No increased work of breathing.  Cardiovascular system: S1 and S2 heard, RRR. No JVD or murmurs. Trace bilateral ankle edema.  Gastrointestinal system: Abdomen is obese, soft and nontender. Normal bowel sounds heard.  External genitalia: No obvious swelling, redness or rash. Mild tenderness to touch-seems superficial. No other acute findings   Central nervous system: Alert and oriented. No focal neurological deficits  Extremities: Symmetric 5 x 5  power.   Data Reviewed: Basic Metabolic Panel:  Lab [id number] 10/18/12 0520 10/17/12 0814  NA 141 143 138  K 3.9 3.5 3.5  CL 109 109 105  CO2 26 25 23   GLUCOSE 170* 73 53*  BUN 25* 27* 25*  CREATININE 2.43* 2.47* 2.32*  CALCIUM 7.5* 7.7* 8.1*  MG -- -- --  PHOS -- -- --   Liver Function Tests:  Lab 10/17/12 0814  AST 21  ALT 12  ALKPHOS 139*  BILITOT 0.2*  PROT 6.5  ALBUMIN 2.2*    Lab 10/17/12 0814  LIPASE 16  AMYLASE --   No results found for this basename: AMMONIA:5 in the last 168 hours CBC:  Lab 10/18/12 0520 10/17/12 0814  WBC 8.6 10.5  NEUTROABS -- 9.1*  HGB 12.9* 14.8  HCT 39.8 43.8  MCV 81.4 81.0  PLT 227 249   Cardiac Enzymes:  Lab 10/17/12 2212  CKTOTAL --  CKMB --  CKMBINDEX --  TROPONINI <0.30   BNP (last 3 results) No results found for this basename: PROBNP:3 in the last 8760 hours CBG:  Lab 10/19/12 1130 10/19/12 0811 10/19/12 0630 10/19/12 0319 10/18/12 2046  GLUCAP 159* 146* 157* 194* 183*    Recent Results (from the past 240 hour(s))  URINE CULTURE     Status: Normal   Collection Time   10/17/12  8:28 AM      Component Value Range Status Comment   Specimen Description URINE, RANDOM   Final    Special Requests ADDED [id number]   Final    Culture  Setup Time 10/17/2012 12:14   Final    Colony Count NO GROWTH   Final    Culture NO GROWTH   Final    Report Status 10/18/2012 FINAL   Final      Studies: US Renal  10/17/2012  *RADIOLOGY REPORT*  Clinical Data: Renal failure.  Diabetic hypertensive patient.  RENAL/URINARY TRACT ULTRASOUND COMPLETE  Comparison:  None.  Findings:  Right Kidney:  11.1 cm. No hydronephrosis or renal mass.  Left Kidney:  11.8 cm. No hydronephrosis or renal mass.  Bladder:  No gross abnormality.  IMPRESSION: No hydronephrosis.  Dromedary configuration noted bilaterally.   Original Report Authenticated By: Genia Del, M.D.     Scheduled Meds:    . allopurinol  100 mg Oral Daily  . amLODipine  10  mg Oral Daily  . aspirin EC  81 mg Oral Daily  . atorvastatin  20 mg Oral Daily  . buPROPion  300 mg Oral Daily  . enoxaparin (LOVENOX) injection  40 mg Subcutaneous Q24H  . insulin aspart  0-5 Units Subcutaneous QHS  . insulin aspart  0-9 Units Subcutaneous TID WC  . pantoprazole  40 mg Oral Daily  . 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 If 8PM-8AM, please contact night-coverage at [email or web address], password [facility] 10/19/2012, 3:28 PM  LOS: 2 days

## 2012-10-20 LAB — GLUCOSE, CAPILLARY
Glucose-Capillary: 160 mg/dL — ABNORMAL HIGH (ref 70–99)
Glucose-Capillary: 166 mg/dL — ABNORMAL HIGH (ref 70–99)
Glucose-Capillary: 176 mg/dL — ABNORMAL HIGH (ref 70–99)

## 2012-10-20 LAB — BASIC METABOLIC PANEL
CO2: 26 mEq/L (ref 19–32)
Calcium: 8.1 mg/dL — ABNORMAL LOW (ref 8.4–10.5)
Chloride: 107 mEq/L (ref 96–112)
Glucose, Bld: 180 mg/dL — ABNORMAL HIGH (ref 70–99)
Sodium: 141 mEq/L (ref 135–145)

## 2012-10-20 LAB — HEMOGLOBIN A1C: Mean Plasma Glucose: 232 mg/dL — ABNORMAL HIGH (ref <117)

## 2012-10-20 MED ORDER — ALLOPURINOL 100 MG PO TABS: 100.0000 mg | ORAL_TABLET | Freq: Every day | ORAL | Status: DC

## 2012-10-20 MED ORDER — INSULIN LISPRO 100 UNIT/ML ~~LOC~~ SOLN: SUBCUTANEOUS | Status: DC

## 2012-10-20 MED ORDER — PANTOPRAZOLE SODIUM 40 MG PO TBEC: 40.0000 mg | DELAYED_RELEASE_TABLET | Freq: Every day | ORAL | Status: DC

## 2012-10-20 NOTE — Discharge Summary (Addendum)
Physician Discharge Summary  Blake [name] [id number] DOB: [birth year] DOA: 10/17/2012  PCP: [name], MD  Admit date: 10/17/2012 Discharge date: 10/20/2012  Time spent: Greater than 30 minutes  Recommendations for Outpatient Follow-up:  1. With Dr. Emilee Hero, PCP in 1 week. 2. With Dr. Jacelyn Pi, Endocrinology in 1 week 3. With [facility]- keep prior appointment. 4. Consider 2-D echocardiogram as outpatient.  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  Filed Weights   10/17/12 1700 10/19/12 0624 10/20/12 0437  Weight: 124.512 kg (274 lb 8 oz) 124.8 kg (275 lb 2.2 oz) 123.152 kg (271 lb 8 oz)    History of present illness:  67 y.o. male with diabetes, hypertension was brought from home by EMS with altered mental status. When they arrived at his house he was having severe profound hypoglycemia with confusion and combativeness. He received glucagon IM and was transferred to the emergency room. In the emergency room the patient was found to have renal failure with hypoglycemia which persisted even after administration of D 50. Patient is a diabetic requiring long-acting insulin twice a day and his renal function has slowly deteriorated. He is referred for admission for acute renal failure and hypoglycemia  Hospital Course:  1. Hypoglycemia in patient with insulin-dependent DM 2:? Secondary to declining renal function and insulins. Patient gives history of symptomatic hypoglycemia ongoing for a long time but has not been checking CBGs for at least 2 years due to financial constraints. He does however takes his insulins and eats something sweet such as candy  when he has symptoms. Insulins were held, patient was placed on IV dextrose water-hypoglycemia has resolved. Patient was started on sliding scale insulin. CBGs are ranging in the 150s- 160s. Discussed with his primary endocrinologist Dr. Wallace Cullens apparently has not seen her or his PCP in 2 years. Per her records, patient has history of noncompliance and A1c with them in 2012 was 11 and he had nephrotic range proteinuria. Creatinine in July 2012: 1.9. Discharging patient on Humalog sliding scale insulin alone. He is advised to followup with his endocrinologist soon, for further adjustments. Since patient's blood sugars are still in the 150s-160s, long-acting insulins were not started. He has been counseled multiple times by M.D., nursing and the diabetes coordinator's regarding management of diabetes. He verbalizes understanding.  2. Scrotal pain: Patient complained of scrotal pain on 2/6. Clinical exam was unremarkable. Scrotal ultrasound does not show acute findings. This has resolved. Unclear etiology.  3. Chronic kidney disease stage III: No hydronephrosis. Creatinine in July 2012 was 1.9. So this is likely progressive chronic kidney disease. Creatinine stable. Resume home dose of lisinopril and diuretic advised outpatient followup with his nephrologist. 4. Hypertension: Fluctuating and mildly uncontrolled. Continue amlodipineAnd resume lisinopril . Zepeda held secondary to bradycardia.  5. Hypoxemia: Possibly secondary to altered mental status and poor effort. May need to rule out OSA as outpatient.Resolved.  6.  Acute Delirium/confusion on admission: Most likely secondary to symptomatic hypoglycemia. Resolved. 7. Morbid obesity with BMI= 41.8. 8. Chest x-ray suggested enlarged cardiac silhouette compared to 2007. Patient denies dyspnea. Although mildly volume overloaded does not appear in distress. No distended neck veins. Resume home dose of Lasix. Consider outpatient 2-D  echocardiogram.  Procedures:  None   Consultations:  None   Discharge Exam:  Complaints No scrotal pain. Denies any other complaints.   Filed Vitals:   10/19/12 1426 10/19/12 2004 10/20/12 0437 10/20/12 1308  BP: 155/67 151/80 149/76 152/81  Pulse: 82 86 75 95  Temp: 98.3 F (36.8 C) 98.4 F (36.9 C) 98.4 F (36.9 C) 97.9 F (36.6 C)  TempSrc: Oral Oral Oral Oral  Resp: 18 18 20 18   Height:      Weight:   123.152 kg (271 lb 8 oz)   SpO2: 90% 93% 93% 95%   General exam: Comfortable Respiratory system: Clear. No increased work of breathing.  Cardiovascular system: S1 and S2 heard, RRR. No JVD or murmurs. Trace bilateral ankle edema. Telemetry shows sinus rhythm. Gastrointestinal system: Abdomen is obese, soft and nontender. Normal bowel sounds heard.  External genitalia: No acute findings. No further tenderness. Central nervous system: Alert and oriented. No focal neurological deficits  Extremities: Symmetric 5 x 5 power.    Discharge Instructions      Discharge Orders    Future Orders Please Complete By Expires   Ambulatory referral to Nutrition and Diabetic Education      Comments:   Patient on insulin. History of low CBG's.  Needs 1:1 with CDE.   Diet - low sodium heart healthy      Diet Carb Modified      Increase activity slowly      Call MD for:      Comments:   Frequent low blood sugars.   Call MD for:  severe uncontrolled pain          Medication List     As of 10/20/2012  1:32 PM    STOP taking these medications         bisoprolol 10 MG tablet   Commonly known as: ZEBETA      insulin glargine 100 UNIT/ML injection   Commonly known as: LANTUS      TAKE these medications         allopurinol 100 MG tablet   Commonly known as: ZYLOPRIM   Take 1 tablet (100 mg total) by mouth daily.      amLODipine 10 MG tablet   Commonly known as: NORVASC   Take 10 mg by mouth daily.      aspirin EC 81 MG tablet   Take 81 mg by mouth daily.       atorvastatin 20 MG tablet   Commonly known as: LIPITOR   Take 20 mg by mouth daily.      buPROPion 150 MG 24 hr tablet   Commonly known as: WELLBUTRIN XL   Take 300 mg by mouth daily.      furosemide 40 MG tablet   Commonly known as: LASIX   Take 40 mg by mouth 2 (two) times daily.      insulin lispro 100 UNIT/ML injection   Commonly known as: HUMALOG   0-9 Units, Subcutaneous, 3 times daily with meals,   CBG < 70: eat or drink something sweet and recheck, CBG 70 - 120: 0 units, CBG 121 - 150: 1 unit, CBG 151 - 200: 2 units, CBG 201 - 250: 3 units, CBG 251 - 300: 5 units, CBG 301 - 350: 7 units, CBG 351 - 400: 9 units, CBG > 400: call MD.      lisinopril 30 MG tablet   Commonly known as: PRINIVIL,ZESTRIL   Take 30 mg by mouth daily.  pantoprazole 40 MG tablet   Commonly known as: PROTONIX   Take 1 tablet (40 mg total) by mouth daily.        Follow-up Information    Follow up with [facility], MD. Schedule an appointment as soon as possible for a visit in 1 week.   Contact information:   [address] [place] [address] [phone]       Follow up with Jacelyn Pi, MD. Schedule an appointment as soon as possible for a visit in 1 week.   Contact information:   [address] [place] [address] [phone]       Schedule an appointment as soon as possible for a visit with [facility].Minette Brine information:   [address] [place] [address] [phone]          The results of significant diagnostics from this hospitalization (including imaging, microbiology, ancillary and laboratory) are listed below for reference.    Significant Diagnostic Studies: Dg Chest 2 View  10/17/2012  *RADIOLOGY REPORT*  Clinical Data: Discomfort, low blood sugar  CHEST - 2 VIEW  Comparison: 09/16/2005  Findings: Limited inspiratory effect.  Even allowing for this, there does appear to be interval increase in cardiac  size with currently mild to moderate cardiac silhouette enlargement.  There is indistinct perihilar vasculature with vascular congestion and mild interstitial process bilaterally.  No consolidation or pleural effusion.  IMPRESSION: Cardiomegaly versus pericardial effusion.  Findings suggest mild pulmonary edema.   Original Report Authenticated By: Skipper Cliche, M.D.    US Scrotum  10/19/2012  *RADIOLOGY REPORT*  Clinical Data:  Scrotal pain.  SCROTAL ULTRASOUND DOPPLER ULTRASOUND OF THE TESTICLES  Technique: Complete ultrasound examination of the testicles, epididymis, and other scrotal structures was performed.  Color and spectral Doppler ultrasound were also utilized to evaluate blood flow to the testicles.  Comparison:  None.  Findings:  Right testis:  Measures 3.5 x 1.8 x 2.9cm.  Normal homogeneous echogenicity without focal lesions.  Patent intratesticular blood flow  Left testis:  Measures 4.2 x 2.1 x 3.0cm.  Normal homogeneous echogenicity without focal lesions.  Patent intratesticular blood flow  Right epididymis:  Slightly enlarged but no increased blood flow is demonstrated. Small epididymal cysts are noted.  Left epididymis:  Normal in size and appearance.  Hydrocele:  None  Varicocele:  None  Pulsed Doppler interrogation of both testes demonstrates low resistance flow bilaterally.  IMPRESSION:  1.  Normal sonographic appearance of both testicles and patent intra testicular blood flow bilaterally. 2.  Slightly enlarged right epididymis but no increased blood flow is demonstrated.   Original Report Authenticated By: Marijo Sanes, M.D.    US Renal  10/17/2012  *RADIOLOGY REPORT*  Clinical Data: Renal failure.  Diabetic hypertensive patient.  RENAL/URINARY TRACT ULTRASOUND COMPLETE  Comparison:  None.  Findings:  Right Kidney:  11.1 cm. No hydronephrosis or renal mass.  Left Kidney:  11.8 cm. No hydronephrosis or renal mass.  Bladder:  No gross abnormality.  IMPRESSION: No hydronephrosis.  Dromedary  configuration noted bilaterally.   Original Report Authenticated By: Genia Del, M.D.    [place] Art/ven Flow Abd Pelv Doppler  10/19/2012  *RADIOLOGY REPORT*  Clinical Data:  Scrotal pain.  SCROTAL ULTRASOUND DOPPLER ULTRASOUND OF THE TESTICLES  Technique: Complete ultrasound examination of the testicles, epididymis, and other scrotal structures was performed.  Color and spectral Doppler ultrasound were also utilized to evaluate blood flow to the testicles.  Comparison:  None.  Findings:  Right testis:  Measures 3.5 x 1.8 x 2.9cm.  Normal homogeneous echogenicity without focal lesions.  Patent intratesticular blood flow  Left testis:  Measures 4.2 x 2.1 x 3.0cm.  Normal homogeneous echogenicity without focal lesions.  Patent intratesticular blood flow  Right epididymis:  Slightly enlarged but no increased blood flow is demonstrated. Small epididymal cysts are noted.  Left epididymis:  Normal in size and appearance.  Hydrocele:  None  Varicocele:  None  Pulsed Doppler interrogation of both testes demonstrates low resistance flow bilaterally.  IMPRESSION:  1.  Normal sonographic appearance of both testicles and patent intra testicular blood flow bilaterally. 2.  Slightly enlarged right epididymis but no increased blood flow is demonstrated.   Original Report Authenticated By: Marijo Sanes, M.D.     Microbiology: Recent Results (from the past 240 hour(s))  URINE CULTURE     Status: Normal   Collection Time   10/17/12  8:28 AM      Component Value Range Status Comment   Specimen Description URINE, RANDOM   Final    Special Requests ADDED [id number]   Final    Culture  Setup Time 10/17/2012 12:14   Final    Colony Count NO GROWTH   Final    Culture NO GROWTH   Final    Report Status 10/18/2012 FINAL   Final      Labs: Basic Metabolic Panel:  Lab [id number] 10/19/12 0530 10/18/12 0520 10/17/12 0814  NA 141 141 143 138  K 3.9 3.9 3.5 3.5  CL 107 109 109 105  CO2 26 26 25 23   GLUCOSE 180* 170* 73  53*  BUN 23 25* 27* 25*  CREATININE 2.29* 2.43* 2.47* 2.32*  CALCIUM 8.1* 7.5* 7.7* 8.1*  MG -- -- -- --  PHOS -- -- -- --   Liver Function Tests:  Lab 10/17/12 0814  AST 21  ALT 12  ALKPHOS 139*  BILITOT 0.2*  PROT 6.5  ALBUMIN 2.2*    Lab 10/17/12 0814  LIPASE 16  AMYLASE --   No results found for this basename: AMMONIA:5 in the last 168 hours CBC:  Lab 10/18/12 0520 10/17/12 0814  WBC 8.6 10.5  NEUTROABS -- 9.1*  HGB 12.9* 14.8  HCT 39.8 43.8  MCV 81.4 81.0  PLT 227 249   Cardiac Enzymes:  Lab 10/17/12 2212  CKTOTAL --  CKMB --  CKMBINDEX --  TROPONINI <0.30   BNP: BNP (last 3 results) No results found for this basename: PROBNP:3 in the last 8760 hours CBG:  Lab 10/20/12 1117 10/20/12 0602 10/20/12 0318 10/19/12 2142 10/19/12 1624  GLUCAP 176* 166* 160* 182* 212*    Hemoglobin A1c: 9.7    Signed:  [place] Oborn  [facility] 10/20/2012, 1:32 PM

## 2012-10-20 NOTE — Progress Notes (Signed)
Inpatient Diabetes Program Recommendations  AACE/ADA: New Consensus Statement on Inpatient Glycemic Control (2013)  Target Ranges:  Prepandial:   less than 140 mg/dL      Peak postprandial:   less than 180 mg/dL (1-2 hours)      Critically ill patients:  140 - 180 mg/dL   Reason for Visit: Results for Blake [name], Blake [name] (MRN [id number]) as of 10/20/2012 13:20  Ref. Range 10/19/2012 21:42 10/20/2012 03:18 10/20/2012 06:02 10/20/2012 11:17  Glucose-Capillary Latest Range: 70-99 mg/dL 182 (H) 160 (H) 166 (H) 176 (H)   Spoke to patient regarding home management of diabetes.  He has not been checking CBG's and taking insulin.  States that it is very expensive for him to manage CBG's at home.  Discussed that he must check CBG's before each meal and take Novolog insulin based on correction scale.  Also discussed proper treatment and identification of hypoglycemia.   Patient seems overwhelmed by diabetes, food, cost, etc.  Would benefit from follow-up with outpatient diabetes educator.  He is agreeable to this. Patient plans to follow-up with Dr. Chalmers Cater.  Told him to call her if CBG's consistently greater than 180 mg/dL (more that 2 times a day).

## 2012-10-20 NOTE — Progress Notes (Signed)
Pt discharge instructions and patient education completed. Typed up sliding scale for patient to use to determine amount of insulin to be given. IV site d/c. Site WNL. Outpatient diabetes education set up. Patient to get meter & strips at [facility]. No further questions. D/C home. Marko Plume

## 2012-11-29 ENCOUNTER — Encounter: Payer: Self-pay | Admitting: Dietician

## 2012-11-29 ENCOUNTER — Encounter: Payer: Medicare Other | Attending: Internal Medicine | Admitting: Dietician

## 2012-11-29 VITALS — Ht 69.0 in | Wt 252.0 lb

## 2012-11-29 DIAGNOSIS — E119 Type 2 diabetes mellitus without complications: Secondary | ICD-10-CM | POA: Insufficient documentation

## 2012-11-29 DIAGNOSIS — Z713 Dietary counseling and surveillance: Secondary | ICD-10-CM | POA: Insufficient documentation

## 2012-11-29 NOTE — Progress Notes (Signed)
Diabetes Self-Management Education  Visit Number: First/Initial  11/29/2012 Mr. Blake [name], identified by name and date of birth, is a 67 y.o. male with Diabetes Type: Type 2.  Other people present during visit:  Patient   ASSESSMENT  Patient Concerns:  Nutrition/Meal planning  Height 5\' 9"  (1.753 m), weight 252 lb (114.306 kg). Body mass index is 37.2 kg/(m^2).  Lab Results: Hemoglobin A1C  Date Value Range Status  10/19/2012 9.7* <5.7 % Final     (NOTE)                                                                               According to the ADA Clinical Practice Recommendations for 2011, when     HbA1c is used as a screening test:      >=6.5%   Diagnostic of Diabetes Mellitus               (if abnormal result is confirmed)     5.7-6.4%   Increased risk of developing Diabetes Mellitus     References:Diagnosis and Classification of Diabetes Mellitus,Diabetes     [id number] 1):S62-S69 and Standards of Medical Care in             Diabetes - 2011,Diabetes [id number] (Suppl 1):S11-S61.     Family History  Problem Relation Age of Onset  . Diabetes Father    History  Substance Use Topics  . Smoking status: Former Smoker -- 5 years    Types: Cigarettes  . Smokeless tobacco: Never Used  . Alcohol Use: No    Support Systems:  Other (comment) Special Needs:  None  Prior DM Education:   Daily Foot Exams: Yes Patient Belief / Attitude about Diabetes:  No matter what I do I can't control my blood glucose levels  Assessment comments: Recent hospitalization for episode of hypoglycemia.  Also having issues with fluid retention Since his hospitalization on 10/19/2012, he has lost 24 lb of weight.  Unable to determine how much is fluid weight.Comes today to learn more regarding his diet.  Today we will concentrate on the carb content.  He is in need of ongoing dietary support for both the carb and the sodium component.   Diet Recall:  B:  6:00 AM egg and piece of  toast ( whole wheat) and 2 strips of bacon.  Tea unsweetened OR unsweetened OJ (Early Day)  OR sausage link (baked) with egg and toast (2)  Snack: none Lunch:  Sometimes will have lunch.  Will have PB crackers of saltines (4 crackers and PB) OR a fruit cup. Snack: none Dinner: 5-8 PM  Progresso Soup can (1) 8 crackers, orange,  OR can of pintoes or can green peas with 2-3 crackers, and some boiled chicken with bread (1 slice)  unsweetened tea or diet coke. Snack:  Demitria Hay have 2 crackers with PB or a pack of peanuts. Beverages:  Unsweetened tea and diet juice.    Individualized Plan for Diabetes Self-Management Training:  Aim for 1700-1800 calories per day.    Aim for 45 gm of carb at breakfast and 45-60 gm of carb at dinner and 15 gm of carb for 3 snacks spaced throughout the day.  Plan to have a lean protein source at each meal.  Meal= size of the palm of your hand and snack at 1-2 oz of protein.  Keep fat servings at 2-3 added fat servings per meal.  Plan to have foods baked, broiled, grilled, roasted, steamed, stewed, and avoid frying foods.  Plan to have all the non-starchy veggies at meals and snacks that your want.  Plan to use your food labels at all times if available and your exchanges if you have no food label.  Use the serving sizes as a guide.  Try to have fiber in your breads, cereals, cereal/protein bars, vegetables and fruits.  [id number] fiber per slice.  Cereal= 3+gm fiber/serving.  Try to keep your sugar level on the label at (0-9 gm/serving) the majority of the time.  Consider using fruit as your sweet treat.  Monitor servings.  Fruit has carb just like cake.  Eat regular meals and snacks.  Skipping meals leads to periods of low blood glucose.  Aim to try to keep the average meat serving at 150 mg of sodium.  This means, cooking fresh meat as opposed to preserved meats and other frozen dishes.  Continue to moderate your serving sizes.  Plan to have glucose tabs  with you at all times.  Place some at the bedside, have some in your car, and carry the container in your pocket.  When you have a low, plan to stabilize the blood sugar when is comes back up with a snack of protein and starch (PB and cracker or PB and slice of bread or have your meal if it is time for a meal)  Keep a record of your glucose readings and the lows that your are having and always take to all MD or CDE visits.   Education Topics Reviewed with Patient Today:  Topic Points Discussed  Disease State  Brief review  Nutrition Management  Review of carb counting and meal planning, label reading.  Physical Activity and Exercise  Brief review of its advangages  Medications    Monitoring  Review of monitoring and blood glucose goals  Acute Complications  Review of the treatment of hypoglycemia  Chronic Complications    Psychosocial Adjustment    Goal Setting  Established some dietary goals regarding meals and carbs.  Preconception Care (if applicable)      PATIENTS GOALS   Learning Objective(s):     Goal The patient agrees to:  Nutrition  Continue to monitor portions, attempt carb counting and to try to not skip meals  Physical Activity    Medications    Monitoring  Continue to monitor blood glucose and to log his findings.  Problem Solving    Reducing Risk    Health Coping      Expected Outcomes:   Participation in self-care  Self-Care Barriers:     Education material provided: Living Well with Diabetes Yellow Card with diet Prescription and Exchange list Snack List Seasonings list Food Label with recommendations.  If problems or questions, patient to contact team via:  Phone  Time in: 1500     Time out: 1600  Future DSME appointment: -  F/U in 4-6 weeks. To call for appointment following his appointment with Dr. Lanae Crumbly 11/29/2012 3:28 PM

## 2012-12-10 NOTE — Patient Instructions (Signed)
   Aim for 1700-1800 calories per day.    Aim for 45 gm of carb at breakfast and 45-60 gm of carb at dinner and 15 gm of carb for 3 snacks spaced throughout the day.    Plan to have a lean protein source at each meal.  Meal= size of the palm of your hand and snack at 1-2 oz of protein.  Keep fat servings at 2-3 added fat servings per meal.  Plan to have foods baked, broiled, grilled, roasted, steamed, stewed, and avoid frying foods.  Plan to have all the non-starchy veggies at meals and snacks that your want.  Plan to use your food labels at all times if available and your exchanges if you have no food label.  Use the serving sizes as a guide.  Try to have fiber in your breads, cereals, cereal/protein bars, vegetables and fruits.  [id number] fiber per slice.  Cereal= 3+gm fiber/serving.  Try to keep your sugar level on the label at (0-9 gm/serving) the majority of the time.  Consider using fruit as your sweet treat.  Monitor servings.  Fruit has carb just like cake.  Eat regular meals and snacks.  Aim to try to keep the average meat serving at 150 mg of sodium.  This means, cooking fresh meat as opposed to preserved meats and other frozen dishes.

## 2014-04-25 ENCOUNTER — Other Ambulatory Visit: Payer: Self-pay | Admitting: *Deleted

## 2014-04-25 DIAGNOSIS — N184 Chronic kidney disease, stage 4 (severe): Secondary | ICD-10-CM

## 2014-04-25 DIAGNOSIS — Z01810 Encounter for preprocedural cardiovascular examination: Secondary | ICD-10-CM

## 2014-05-09 ENCOUNTER — Encounter: Payer: Self-pay | Admitting: Vascular Surgery

## 2014-05-10 ENCOUNTER — Ambulatory Visit: Payer: Medicare Other | Admitting: Vascular Surgery

## 2014-05-10 ENCOUNTER — Encounter (HOSPITAL_COMMUNITY): Payer: Medicare Other

## 2014-05-10 ENCOUNTER — Other Ambulatory Visit (HOSPITAL_COMMUNITY): Payer: Medicare Other

## 2014-06-03 ENCOUNTER — Encounter: Payer: Self-pay | Admitting: Vascular Surgery

## 2014-06-04 ENCOUNTER — Ambulatory Visit: Payer: Medicare Other | Admitting: Vascular Surgery

## 2014-06-04 ENCOUNTER — Encounter (HOSPITAL_COMMUNITY): Payer: Medicare Other

## 2014-06-04 ENCOUNTER — Other Ambulatory Visit (HOSPITAL_COMMUNITY): Payer: Medicare Other

## 2014-06-26 ENCOUNTER — Encounter: Payer: Self-pay | Admitting: Vascular Surgery

## 2014-06-27 ENCOUNTER — Ambulatory Visit (INDEPENDENT_AMBULATORY_CARE_PROVIDER_SITE_OTHER)
Admission: RE | Admit: 2014-06-27 | Discharge: 2014-06-27 | Disposition: A | Discharge status: Home / Self Care | Payer: Medicare Other | Source: Ambulatory Visit | Attending: Vascular Surgery | Admitting: Vascular Surgery

## 2014-06-27 ENCOUNTER — Encounter: Payer: Self-pay | Admitting: Vascular Surgery

## 2014-06-27 ENCOUNTER — Ambulatory Visit (INDEPENDENT_AMBULATORY_CARE_PROVIDER_SITE_OTHER): Payer: Medicare Other | Admitting: Vascular Surgery

## 2014-06-27 ENCOUNTER — Other Ambulatory Visit: Payer: Self-pay

## 2014-06-27 VITALS — BP 126/53 | HR 80 | Ht 69.0 in | Wt 249.0 lb

## 2014-06-27 DIAGNOSIS — Z01810 Encounter for preprocedural cardiovascular examination: Secondary | ICD-10-CM

## 2014-06-27 DIAGNOSIS — N184 Chronic kidney disease, stage 4 (severe): Secondary | ICD-10-CM | POA: Insufficient documentation

## 2014-06-27 DIAGNOSIS — N179 Acute kidney failure, unspecified: Secondary | ICD-10-CM | POA: Diagnosis not present

## 2014-06-27 DIAGNOSIS — R0602 Shortness of breath: Secondary | ICD-10-CM | POA: Diagnosis not present

## 2014-06-27 DIAGNOSIS — N186 End stage renal disease: Secondary | ICD-10-CM

## 2014-06-27 NOTE — Progress Notes (Addendum)
HISTORY AND PHYSICAL     CC:  In need of permanent access  Blake Seashore, MD  HPI: This is a 68 y.o. male who presents today in need of permanent HD access.  He is a poor historian and does not know why his kidneys are failing.    He states that he has a hx of HTN, which he takes an ACEI and CCB.  He also has diabetes, which he takes insulin for.  He states that he does have shortness of breath with exertion and just can't do the things he used to do and is more weak.  He states that he did fall a couple of weeks ago when he missed a step and his his face.  He states he has bruising over the bridge of his nose and over his right eye.  He also states that he has some right hand weakness, which started about 7 months ago and has continued to worsen.  He states that he has trouble holding on to things.  He states that he used to work and had repetitive motion on a daily basis.  He denies any hx of stroke or stroke symptoms.  He is referred for permanent HD access.  He states he does not know when they are wanting to start HD.    Past Medical History  Diagnosis Date  . Diabetes mellitus without complication   . Hypertension   . Hyperlipidemia    Past Surgical History  Procedure Laterality Date  . Back surgery      No Known Allergies  Current Outpatient Prescriptions  Medication Sig Dispense Refill  . allopurinol (ZYLOPRIM) 100 MG tablet Take 1 tablet (100 mg total) by mouth daily.  30 tablet  0  . amLODipine (NORVASC) 10 MG tablet Take 10 mg by mouth daily.      Marland Kitchen aspirin EC 81 MG tablet Take 81 mg by mouth daily.      Marland Kitchen atorvastatin (LIPITOR) 20 MG tablet Take 20 mg by mouth daily.      Marland Kitchen buPROPion (WELLBUTRIN XL) 150 MG 24 hr tablet Take 300 mg by mouth daily.      . furosemide (LASIX) 40 MG tablet Take 40 mg by mouth 2 (two) times daily.      . insulin lispro (HUMALOG) 100 UNIT/ML injection 0-9 Units, Subcutaneous, 3 times daily with meals,  CBG < 70: eat or drink something  sweet and recheck, CBG 70 - 120: 0 units, CBG 121 - 150: 1 unit, CBG 151 - 200: 2 units, CBG 201 - 250: 3 units, CBG 251 - 300: 5 units, CBG 301 - 350: 7 units, CBG 351 - 400: 9 units, CBG > 400: call MD.      . lisinopril (PRINIVIL,ZESTRIL) 30 MG tablet Take 30 mg by mouth daily.      . pantoprazole (PROTONIX) 40 MG tablet Take 1 tablet (40 mg total) by mouth daily.  30 tablet  0   No current facility-administered medications for this visit.    Family History  Problem Relation Age of Onset  . Diabetes Father     History   Social History  . Marital Status: Single    Spouse Name: N/A    Number of Children: N/A  . Years of Education: N/A   Occupational History  . Not on file.   Social History Main Topics  . Smoking status: Former Smoker -- 5 years    Types: Cigarettes  . Smokeless tobacco: Never Used  .  Alcohol Use: No  . Drug Use: No  . Sexual Activity: Not on file   Other Topics Concern  . Not on file   Social History Narrative  . No narrative on file     ROS: [x]  Positive   [ ]  Negative   [ ]  All sytems reviewed and are negative  Cardiovascular: []  chest pain/pressure []  palpitations []  SOB lying flat [x]  DOE []  pain in legs while walking []  pain in feet when lying flat []  hx of DVT []  hx of phlebitis []  swelling in legs []  varicose veins  Pulmonary: []  productive cough []  asthma []  wheezing  Neurologic: [x]  weakness in [x]  right hand []  legs []  numbness in []  arms []  legs [] difficulty speaking or slurred speech []  temporary loss of vision in one eye []  dizziness  Hematologic: []  bleeding problems []  problems with blood clotting easily  GI []  vomiting blood []  blood in stool  GU: [x]  CKD/renal failure  []  HD---[]  M/W/F []  T/T/F []  burning with urination []  blood in urine  Psychiatric: []  hx of major depression  Integumentary: []  rashes []  ulcers  Constitutional: []  fever []  chills  PHYSICAL EXAMINATION:  Filed Vitals:    06/27/14 1500  BP: 126/53  Pulse: 80   Body mass index is 36.75 kg/(m^2).  General:  WDWN in NAD Gait: Not observed HENT: WNL Pulmonary: normal non-labored breathing , without Rales, rhonchi,  wheezing Cardiac: RRR, without  Murmurs, rubs or gallops; without carotid bruits Abdomen: soft, NT, no masses Skin: without rashes, without ulcers  Vascular Exam/Pulses:   Right Left  Radial 2+ (normal) 2+ (normal)  Ulnar Unable to palpate  Unable to palpate   Femoral Not palpated Not palpated  Popliteal Unable to palpate  Unable to palpate   PT Unable to palpate  Unable to palpate    Extremities:  without ischemic changes, without Gangrene, without cellulitis; without open wounds;  Musculoskeletal: no muscle wasting or atrophy  Neurologic: A&O X 3; Appropriate Affect ; SENSATION: normal; MOTOR FUNCTION:  moving all extremities equally. Speech is fluent/normal. Right hand grip is weaker than left  Non-Invasive Vascular Imaging:   Upper Extremity Vein Mapping Right Left   Cephalic Basilic  Cephalic Basilic  Diameter cm Diameter cm  Diameter cm Diameter cm  0.28  Shoulder 0.38   0.27  Upper Arm Proximal 0.36   0.27 0.58 Upper Arm Mid 0.37 0.45  0.27 0.48 Upper Arm Distal 0.32 0.31  0.32 0.24 Antecubital Fossa 0.52 0.35  0.18 0.19 Forearm proximal 0.18 0.23  0.16  Forearm mid 0.17   0.20  Forearm distal 0.15   0.30-elbow 0.48-elbow 0.31-elbow Visible branches 0.18-dis brachium 0.33-elbow 0.26-elbow    Visible Branches 0.16-mid forearm    ---the bilateral cephalic and basilic veins are compressible  ASSESSMENT/PLAN: 68 y.o. male in need of permanent HD access   - pt is a poor historian and there are not any notes to be reviewed from [place] -pt does not know when he is to be started on HD. -his SpO2 today is 84% and he is asymptomatic today -he is right handed and states that he does already have some tingling in his right hand and would like to proceed  with access in his right arm.  Dr. Oneida Alar explained the procedure to the pt.  We will proceed with HD access on July 10, 2014 -he denies ever having a stroke.  He may have some component of carpal tunnel syndrome as he reports having  to do a repetitive motion when he was working and he does have tingling in his fingers.  May benefit from hand surgeon referral in the future.     Leontine Locket, PA-C [facility] [phone]  Clinic MD:  Pt seen and examined in conjunction with Dr. Oneida Alar  History and exam details as above.  Plan for right brachiocephalic AVF Oct 28  Ruta Hinds, MD [facility] Office: [phone] Pager: [phone]

## 2014-06-28 ENCOUNTER — Emergency Department (HOSPITAL_COMMUNITY): Payer: Medicare Other

## 2014-06-28 ENCOUNTER — Encounter (HOSPITAL_COMMUNITY): Payer: Self-pay | Admitting: Emergency Medicine

## 2014-06-28 ENCOUNTER — Inpatient Hospital Stay (HOSPITAL_COMMUNITY): Payer: Medicare Other

## 2014-06-28 ENCOUNTER — Inpatient Hospital Stay (HOSPITAL_COMMUNITY)
Admission: EM | Admit: 2014-06-28 | Discharge: 2014-07-09 | DRG: 673 | Disposition: A | Discharge status: Skilled Nursing Facility | Payer: Medicare Other | Attending: Internal Medicine | Admitting: Internal Medicine

## 2014-06-28 DIAGNOSIS — R809 Proteinuria, unspecified: Secondary | ICD-10-CM

## 2014-06-28 DIAGNOSIS — Z794 Long term (current) use of insulin: Secondary | ICD-10-CM | POA: Diagnosis not present

## 2014-06-28 DIAGNOSIS — E11319 Type 2 diabetes mellitus with unspecified diabetic retinopathy without macular edema: Secondary | ICD-10-CM | POA: Diagnosis present

## 2014-06-28 DIAGNOSIS — Z6832 Body mass index (BMI) 32.0-32.9, adult: Secondary | ICD-10-CM | POA: Diagnosis not present

## 2014-06-28 DIAGNOSIS — Z79899 Other long term (current) drug therapy: Secondary | ICD-10-CM | POA: Diagnosis not present

## 2014-06-28 DIAGNOSIS — D631 Anemia in chronic kidney disease: Secondary | ICD-10-CM

## 2014-06-28 DIAGNOSIS — J811 Chronic pulmonary edema: Secondary | ICD-10-CM | POA: Diagnosis present

## 2014-06-28 DIAGNOSIS — N186 End stage renal disease: Secondary | ICD-10-CM | POA: Diagnosis present

## 2014-06-28 DIAGNOSIS — E780 Pure hypercholesterolemia: Secondary | ICD-10-CM | POA: Diagnosis present

## 2014-06-28 DIAGNOSIS — N182 Chronic kidney disease, stage 2 (mild): Secondary | ICD-10-CM

## 2014-06-28 DIAGNOSIS — N183 Chronic kidney disease, stage 3 unspecified: Secondary | ICD-10-CM

## 2014-06-28 DIAGNOSIS — M79604 Pain in right leg: Secondary | ICD-10-CM | POA: Diagnosis present

## 2014-06-28 DIAGNOSIS — Z7982 Long term (current) use of aspirin: Secondary | ICD-10-CM

## 2014-06-28 DIAGNOSIS — N2581 Secondary hyperparathyroidism of renal origin: Secondary | ICD-10-CM | POA: Diagnosis present

## 2014-06-28 DIAGNOSIS — N172 Acute kidney failure with medullary necrosis: Secondary | ICD-10-CM

## 2014-06-28 DIAGNOSIS — N179 Acute kidney failure, unspecified: Secondary | ICD-10-CM | POA: Diagnosis present

## 2014-06-28 DIAGNOSIS — Z992 Dependence on renal dialysis: Secondary | ICD-10-CM

## 2014-06-28 DIAGNOSIS — R5381 Other malaise: Secondary | ICD-10-CM | POA: Diagnosis present

## 2014-06-28 DIAGNOSIS — J189 Pneumonia, unspecified organism: Secondary | ICD-10-CM | POA: Diagnosis present

## 2014-06-28 DIAGNOSIS — R1110 Vomiting, unspecified: Secondary | ICD-10-CM

## 2014-06-28 DIAGNOSIS — E119 Type 2 diabetes mellitus without complications: Secondary | ICD-10-CM | POA: Diagnosis present

## 2014-06-28 DIAGNOSIS — E8770 Fluid overload, unspecified: Secondary | ICD-10-CM

## 2014-06-28 DIAGNOSIS — E872 Acidosis: Secondary | ICD-10-CM | POA: Diagnosis present

## 2014-06-28 DIAGNOSIS — R0602 Shortness of breath: Secondary | ICD-10-CM | POA: Diagnosis present

## 2014-06-28 DIAGNOSIS — Z833 Family history of diabetes mellitus: Secondary | ICD-10-CM | POA: Diagnosis not present

## 2014-06-28 DIAGNOSIS — J9690 Respiratory failure, unspecified, unspecified whether with hypoxia or hypercapnia: Secondary | ICD-10-CM

## 2014-06-28 DIAGNOSIS — J9601 Acute respiratory failure with hypoxia: Secondary | ICD-10-CM | POA: Diagnosis present

## 2014-06-28 DIAGNOSIS — F329 Major depressive disorder, single episode, unspecified: Secondary | ICD-10-CM | POA: Diagnosis present

## 2014-06-28 DIAGNOSIS — N189 Chronic kidney disease, unspecified: Secondary | ICD-10-CM

## 2014-06-28 DIAGNOSIS — Z7189 Other specified counseling: Secondary | ICD-10-CM

## 2014-06-28 DIAGNOSIS — Z515 Encounter for palliative care: Secondary | ICD-10-CM

## 2014-06-28 DIAGNOSIS — J90 Pleural effusion, not elsewhere classified: Secondary | ICD-10-CM

## 2014-06-28 DIAGNOSIS — K3184 Gastroparesis: Secondary | ICD-10-CM | POA: Diagnosis present

## 2014-06-28 DIAGNOSIS — E162 Hypoglycemia, unspecified: Secondary | ICD-10-CM

## 2014-06-28 DIAGNOSIS — E1122 Type 2 diabetes mellitus with diabetic chronic kidney disease: Secondary | ICD-10-CM | POA: Diagnosis present

## 2014-06-28 DIAGNOSIS — F32A Depression, unspecified: Secondary | ICD-10-CM | POA: Diagnosis present

## 2014-06-28 DIAGNOSIS — N5082 Scrotal pain: Secondary | ICD-10-CM

## 2014-06-28 DIAGNOSIS — G9340 Encephalopathy, unspecified: Secondary | ICD-10-CM | POA: Diagnosis present

## 2014-06-28 DIAGNOSIS — Z9889 Other specified postprocedural states: Secondary | ICD-10-CM

## 2014-06-28 DIAGNOSIS — M79605 Pain in left leg: Secondary | ICD-10-CM | POA: Diagnosis present

## 2014-06-28 DIAGNOSIS — E785 Hyperlipidemia, unspecified: Secondary | ICD-10-CM | POA: Diagnosis present

## 2014-06-28 DIAGNOSIS — E875 Hyperkalemia: Secondary | ICD-10-CM | POA: Diagnosis present

## 2014-06-28 DIAGNOSIS — I120 Hypertensive chronic kidney disease with stage 5 chronic kidney disease or end stage renal disease: Secondary | ICD-10-CM | POA: Diagnosis present

## 2014-06-28 DIAGNOSIS — R110 Nausea: Secondary | ICD-10-CM

## 2014-06-28 DIAGNOSIS — N185 Chronic kidney disease, stage 5: Secondary | ICD-10-CM

## 2014-06-28 DIAGNOSIS — R0902 Hypoxemia: Secondary | ICD-10-CM

## 2014-06-28 DIAGNOSIS — G5793 Unspecified mononeuropathy of bilateral lower limbs: Secondary | ICD-10-CM | POA: Diagnosis present

## 2014-06-28 DIAGNOSIS — R0600 Dyspnea, unspecified: Secondary | ICD-10-CM

## 2014-06-28 DIAGNOSIS — E669 Obesity, unspecified: Secondary | ICD-10-CM | POA: Diagnosis present

## 2014-06-28 DIAGNOSIS — Z87891 Personal history of nicotine dependence: Secondary | ICD-10-CM | POA: Diagnosis not present

## 2014-06-28 DIAGNOSIS — I10 Essential (primary) hypertension: Secondary | ICD-10-CM | POA: Diagnosis present

## 2014-06-28 DIAGNOSIS — J810 Acute pulmonary edema: Secondary | ICD-10-CM | POA: Insufficient documentation

## 2014-06-28 DIAGNOSIS — J9600 Acute respiratory failure, unspecified whether with hypoxia or hypercapnia: Secondary | ICD-10-CM | POA: Diagnosis present

## 2014-06-28 LAB — BLOOD GAS, ARTERIAL
Acid-base deficit: 13.6 mmol/L — ABNORMAL HIGH (ref 0.0–2.0)
Bicarbonate: 12.4 meq/L — ABNORMAL LOW (ref 20.0–24.0)
Delivery systems: POSITIVE
Drawn by: 35849
Expiratory PAP: 8
FIO2: 0.55 %
Inspiratory PAP: 16
O2 Saturation: 91.8 %
Patient temperature: 98.2
RATE: 10 {breaths}/min
TCO2: 13.3 mmol/L (ref 0–100)
pCO2 arterial: 29.9 mmHg — ABNORMAL LOW (ref 35.0–45.0)
pH, Arterial: 7.24 — ABNORMAL LOW (ref 7.350–7.450)
pO2, Arterial: 78.8 mmHg — ABNORMAL LOW (ref 80.0–100.0)

## 2014-06-28 LAB — COMPREHENSIVE METABOLIC PANEL
ALBUMIN: 2.1 g/dL — AB (ref 3.5–5.2)
ALK PHOS: 117 U/L (ref 39–117)
ALT: 13 U/L (ref 0–53)
ANION GAP: 24 — AB (ref 5–15)
AST: 20 U/L (ref 0–37)
BUN: 92 mg/dL — ABNORMAL HIGH (ref 6–23)
CO2: 10 mEq/L — CL (ref 19–32)
CREATININE: 9.37 mg/dL — AB (ref 0.50–1.35)
Calcium: 7.7 mg/dL — ABNORMAL LOW (ref 8.4–10.5)
Chloride: 102 mEq/L (ref 96–112)
GFR calc non Af Amer: 5 mL/min — ABNORMAL LOW (ref >90)
GFR, EST AFRICAN AMERICAN: 6 mL/min — AB (ref >90)
GLUCOSE: 140 mg/dL — AB (ref 70–99)
POTASSIUM: 5.6 meq/L — AB (ref 3.7–5.3)
Sodium: 136 mEq/L — ABNORMAL LOW (ref 137–147)
TOTAL PROTEIN: 7.7 g/dL (ref 6.0–8.3)
Total Bilirubin: 0.3 mg/dL (ref 0.3–1.2)

## 2014-06-28 LAB — CBC WITH DIFFERENTIAL/PLATELET
BASOS PCT: 0 % (ref 0–1)
Basophils Absolute: 0 10*3/uL (ref 0.0–0.1)
EOS ABS: 0.3 10*3/uL (ref 0.0–0.7)
Eosinophils Relative: 2 % (ref 0–5)
HCT: 28.4 % — ABNORMAL LOW (ref 39.0–52.0)
Hemoglobin: 9.3 g/dL — ABNORMAL LOW (ref 13.0–17.0)
Lymphocytes Relative: 3 % — ABNORMAL LOW (ref 12–46)
Lymphs Abs: 0.5 10*3/uL — ABNORMAL LOW (ref 0.7–4.0)
MCH: 27 pg (ref 26.0–34.0)
MCHC: 32.7 g/dL (ref 30.0–36.0)
MCV: 82.6 fL (ref 78.0–100.0)
Monocytes Absolute: 1.5 10*3/uL — ABNORMAL HIGH (ref 0.1–1.0)
Monocytes Relative: 10 % (ref 3–12)
NEUTROS PCT: 85 % — AB (ref 43–77)
Neutro Abs: 13.4 10*3/uL — ABNORMAL HIGH (ref 1.7–7.7)
Platelets: 394 10*3/uL (ref 150–400)
RBC: 3.44 MIL/uL — ABNORMAL LOW (ref 4.22–5.81)
RDW: 13.8 % (ref 11.5–15.5)
WBC: 15.8 10*3/uL — ABNORMAL HIGH (ref 4.0–10.5)

## 2014-06-28 LAB — I-STAT ARTERIAL BLOOD GAS, ED
Acid-base deficit: 14 mmol/L — ABNORMAL HIGH (ref 0.0–2.0)
Bicarbonate: 11.5 meq/L — ABNORMAL LOW (ref 20.0–24.0)
O2 Saturation: 99 %
Patient temperature: 37
TCO2: 12 mmol/L (ref 0–100)
pCO2 arterial: 24.2 mmHg — ABNORMAL LOW (ref 35.0–45.0)
pH, Arterial: 7.286 — ABNORMAL LOW (ref 7.350–7.450)
pO2, Arterial: 134 mmHg — ABNORMAL HIGH (ref 80.0–100.0)

## 2014-06-28 LAB — I-STAT CHEM 8, ED
BUN: 94 mg/dL — ABNORMAL HIGH (ref 6–23)
Calcium, Ion: 0.94 mmol/L — ABNORMAL LOW (ref 1.13–1.30)
Chloride: 112 mEq/L (ref 96–112)
Creatinine, Ser: 10.2 mg/dL — ABNORMAL HIGH (ref 0.50–1.35)
Glucose, Bld: 142 mg/dL — ABNORMAL HIGH (ref 70–99)
HCT: 32 % — ABNORMAL LOW (ref 39.0–52.0)
HEMOGLOBIN: 10.9 g/dL — AB (ref 13.0–17.0)
POTASSIUM: 5.3 meq/L (ref 3.7–5.3)
Sodium: 137 mEq/L (ref 137–147)
TCO2: 11 mmol/L (ref 0–100)

## 2014-06-28 LAB — PROCALCITONIN: Procalcitonin: 1.48 ng/mL

## 2014-06-28 LAB — URINALYSIS, ROUTINE W REFLEX MICROSCOPIC
Bilirubin Urine: NEGATIVE
Glucose, UA: 100 mg/dL — AB
Ketones, ur: NEGATIVE mg/dL
Leukocytes, UA: NEGATIVE
Nitrite: NEGATIVE
Specific Gravity, Urine: 1.016 (ref 1.005–1.030)
Urobilinogen, UA: 0.2 mg/dL (ref 0.0–1.0)
pH: 5 (ref 5.0–8.0)

## 2014-06-28 LAB — URINE MICROSCOPIC-ADD ON

## 2014-06-28 LAB — CBG MONITORING, ED: GLUCOSE-CAPILLARY: 135 mg/dL — AB (ref 70–99)

## 2014-06-28 LAB — TROPONIN I: Troponin I: <0.3 ng/mL

## 2014-06-28 LAB — GLUCOSE, CAPILLARY
GLUCOSE-CAPILLARY: 157 mg/dL — AB (ref 70–99)
GLUCOSE-CAPILLARY: 148 mg/dL — AB (ref 70–99)
Glucose-Capillary: 103 mg/dL — ABNORMAL HIGH (ref 70–99)
Glucose-Capillary: 91 mg/dL (ref 70–99)

## 2014-06-28 LAB — HEPATITIS B CORE ANTIBODY, TOTAL: HEP B C TOTAL AB: NONREACTIVE

## 2014-06-28 LAB — I-STAT CG4 LACTIC ACID, ED: Lactic Acid, Venous: 1.17 mmol/L (ref 0.5–2.2)

## 2014-06-28 LAB — MRSA PCR SCREENING: MRSA by PCR: NEGATIVE

## 2014-06-28 LAB — PRO B NATRIURETIC PEPTIDE: PRO B NATRI PEPTIDE: 1556 pg/mL — AB (ref 0–125)

## 2014-06-28 LAB — PHOSPHORUS: Phosphorus: 5.9 mg/dL — ABNORMAL HIGH (ref 2.3–4.6)

## 2014-06-28 LAB — STREP PNEUMONIAE URINARY ANTIGEN: STREP PNEUMO URINARY ANTIGEN: NEGATIVE

## 2014-06-28 MED ORDER — ASPIRIN EC 81 MG PO TBEC
81.0000 mg | DELAYED_RELEASE_TABLET | Freq: Every day | ORAL | Status: DC
  Administered 2014-06-29 – 2014-07-09 (×11): 81 mg via ORAL
  Filled 2014-06-28 (×11): qty 1

## 2014-06-28 MED ORDER — LIDOCAINE HCL (PF) 1 % IJ SOLN: 5.0000 mL | INTRAMUSCULAR | Status: DC | PRN

## 2014-06-28 MED ORDER — NEPRO/CARBSTEADY PO LIQD
237.0000 mL | ORAL | Status: DC | PRN
  Filled 2014-06-28: qty 237

## 2014-06-28 MED ORDER — HEPARIN SODIUM (PORCINE) 1000 UNIT/ML DIALYSIS: 1000.0000 [IU] | INTRAMUSCULAR | Status: DC | PRN

## 2014-06-28 MED ORDER — PANTOPRAZOLE SODIUM 40 MG IV SOLR
40.0000 mg | Freq: Every day | INTRAVENOUS | Status: DC
  Administered 2014-06-28 – 2014-06-29 (×2): 40 mg via INTRAVENOUS
  Filled 2014-06-28 (×3): qty 40

## 2014-06-28 MED ORDER — VANCOMYCIN HCL 10 G IV SOLR
2000.0000 mg | Freq: Once | INTRAVENOUS | Status: AC
  Administered 2014-06-28: 2000 mg via INTRAVENOUS
  Filled 2014-06-28: qty 2000

## 2014-06-28 MED ORDER — SODIUM CHLORIDE 0.9 % IV SOLN: 100.0000 mL | INTRAVENOUS | Status: DC | PRN

## 2014-06-28 MED ORDER — ASPIRIN 300 MG RE SUPP: 300.0000 mg | RECTAL | Status: AC

## 2014-06-28 MED ORDER — CEFAZOLIN SODIUM 1-5 GM-% IV SOLN: 1.0000 g | INTRAVENOUS | Status: DC

## 2014-06-28 MED ORDER — LIDOCAINE-PRILOCAINE 2.5-2.5 % EX CREA
1.0000 "application " | TOPICAL_CREAM | CUTANEOUS | Status: DC | PRN
  Filled 2014-06-28: qty 5

## 2014-06-28 MED ORDER — HEPARIN SODIUM (PORCINE) 5000 UNIT/ML IJ SOLN
5000.0000 [IU] | Freq: Three times a day (TID) | INTRAMUSCULAR | Status: DC
  Administered 2014-06-28 – 2014-07-09 (×30): 5000 [IU] via SUBCUTANEOUS
  Filled 2014-06-28 (×39): qty 1

## 2014-06-28 MED ORDER — DEXTROSE 5 % IV SOLN
1.0000 g | INTRAVENOUS | Status: DC
  Administered 2014-06-29 – 2014-07-02 (×4): 1 g via INTRAVENOUS
  Filled 2014-06-28 (×5): qty 10

## 2014-06-28 MED ORDER — AZITHROMYCIN 500 MG IV SOLR
500.0000 mg | INTRAVENOUS | Status: DC
  Administered 2014-06-29 – 2014-06-30 (×2): 500 mg via INTRAVENOUS
  Filled 2014-06-28 (×2): qty 500

## 2014-06-28 MED ORDER — BUPROPION HCL ER (XL) 300 MG PO TB24
300.0000 mg | ORAL_TABLET | Freq: Every day | ORAL | Status: DC
  Administered 2014-06-29 – 2014-07-07 (×9): 300 mg via ORAL
  Filled 2014-06-28 (×10): qty 1

## 2014-06-28 MED ORDER — CEFAZOLIN SODIUM-DEXTROSE 2-3 GM-% IV SOLR: 2.0000 g | INTRAVENOUS | Status: DC

## 2014-06-28 MED ORDER — VANCOMYCIN HCL 500 MG IV SOLR
500.0000 mg | INTRAVENOUS | Status: DC
  Administered 2014-06-28: 500 mg via INTRAVENOUS
  Filled 2014-06-28 (×3): qty 500

## 2014-06-28 MED ORDER — SODIUM CHLORIDE 0.9 % IV SOLN: INTRAVENOUS | Status: DC

## 2014-06-28 MED ORDER — INSULIN ASPART 100 UNIT/ML ~~LOC~~ SOLN
0.0000 [IU] | SUBCUTANEOUS | Status: DC
  Administered 2014-06-29 (×2): 4 [IU] via SUBCUTANEOUS
  Administered 2014-06-30 (×2): 3 [IU] via SUBCUTANEOUS
  Administered 2014-06-30: 4 [IU] via SUBCUTANEOUS

## 2014-06-28 MED ORDER — PENTAFLUOROPROP-TETRAFLUOROETH EX AERO: 1.0000 "application " | INHALATION_SPRAY | CUTANEOUS | Status: DC | PRN

## 2014-06-28 MED ORDER — SODIUM CHLORIDE 0.9 % IV SOLN: 250.0000 mL | INTRAVENOUS | Status: DC | PRN

## 2014-06-28 MED ORDER — HEPARIN SODIUM (PORCINE) 1000 UNIT/ML DIALYSIS: 20.0000 [IU]/kg | INTRAMUSCULAR | Status: DC | PRN

## 2014-06-28 MED ORDER — DEXTROSE 5 % IV SOLN
500.0000 mg | Freq: Once | INTRAVENOUS | Status: AC
  Administered 2014-06-28: 500 mg via INTRAVENOUS
  Filled 2014-06-28: qty 500

## 2014-06-28 MED ORDER — ATORVASTATIN CALCIUM 20 MG PO TABS
20.0000 mg | ORAL_TABLET | Freq: Every day | ORAL | Status: DC
  Administered 2014-06-29 – 2014-07-08 (×10): 20 mg via ORAL
  Filled 2014-06-28 (×14): qty 1

## 2014-06-28 MED ORDER — AMLODIPINE BESYLATE 10 MG PO TABS
10.0000 mg | ORAL_TABLET | Freq: Every day | ORAL | Status: DC
  Administered 2014-06-29 – 2014-07-01 (×3): 10 mg via ORAL
  Filled 2014-06-28 (×4): qty 1

## 2014-06-28 MED ORDER — ASPIRIN 81 MG PO CHEW: 324.0000 mg | CHEWABLE_TABLET | ORAL | Status: AC

## 2014-06-28 MED ORDER — ALTEPLASE 2 MG IJ SOLR
2.0000 mg | Freq: Once | INTRAMUSCULAR | Status: DC | PRN
  Filled 2014-06-28: qty 2

## 2014-06-28 MED ORDER — DEXTROSE 5 % IV SOLN
1.0000 g | Freq: Once | INTRAVENOUS | Status: AC
  Administered 2014-06-28: 1 g via INTRAVENOUS
  Filled 2014-06-28: qty 10

## 2014-06-28 MED ORDER — CALCITRIOL 0.25 MCG PO CAPS
0.2500 ug | ORAL_CAPSULE | Freq: Every day | ORAL | Status: DC
  Administered 2014-06-29 – 2014-07-09 (×11): 0.25 ug via ORAL
  Filled 2014-06-28 (×12): qty 1

## 2014-06-28 NOTE — ED Notes (Signed)
Checked patient blood sugar it was 135 notified RN of blood sugar

## 2014-06-28 NOTE — Care Management Note (Addendum)
    Page 1 of 1   07/01/2014     11:19:01 AM CARE MANAGEMENT NOTE 07/01/2014  Patient:  Blake [name], Blake [name]   Account Number:  [id number]  Date Initiated:  06/28/2014  Documentation initiated by:  Elissa Hefty  Subjective/Objective Assessment:   adm w resp distress     Action/Plan:   lives w niece   Anticipated DC Date:     Anticipated DC Plan:           Choice offered to / List presented to:             Status of service:   Medicare Important Message given?  YES (If response is "NO", the following Medicare IM given date fields will be blank) Date Medicare IM given:  07/01/2014 Medicare IM given by:  Elissa Hefty Date Additional Medicare IM given:   Additional Medicare IM given by:    Discharge Disposition:    Per UR Regulation:  Reviewed for med. necessity/level of care/duration of stay  If discussed at [place] of Stay Meetings, dates discussed:    Comments:

## 2014-06-28 NOTE — Procedures (Signed)
Hemodialysis Insertion Procedure Note [name] [id number] May 22, 1946  Procedure: Insertion of Hemodialysis Catheter Type: 3 port  Indications: Hemodialysis   Procedure Details Consent: Risks of procedure as well as the alternatives and risks of each were explained to the (patient/caregiver).  Consent for procedure obtained. Time Out: Verified patient identification, verified procedure, site/side was marked, verified correct patient position, special equipment/implants available, medications/allergies/relevent history reviewed, required imaging and test results available.  Performed  Maximum sterile technique was used including antiseptics, cap, gloves, gown, hand hygiene, mask and sheet. Skin prep: Chlorhexidine; local anesthetic administered A antimicrobial bonded/coated triple lumen catheter was placed in the right internal jugular vein using the Seldinger technique. Ultrasound guidance used.Yes.   Catheter placed to 16 cm. Blood aspirated via all 3 ports and then flushed x 3. Line sutured x 2 and dressing applied.  Evaluation Blood flow good Complications: No apparent complications Patient did tolerate procedure well. Chest X-ray ordered to verify placement.  CXR: pending.  Richardson Landry Minor ACNP [facility] Pager [phone] till 3 pm If no answer page [phone] 06/28/2014, 12:21 PM  Baltazar Apo, MD, PhD 07/01/2014, 4:31 PM [facility] [phone] or if no answer [phone]

## 2014-06-28 NOTE — Procedures (Signed)
Patient was seen on dialysis and the procedure was supervised. BFR 200 Via RIJ trialysis catheter BP is 143/59.  Patient appears to be tolerating treatment well

## 2014-06-28 NOTE — ED Notes (Signed)
Pt reports shortness of breath x 7 months, worsening over the last month and un-tolerable today.

## 2014-06-28 NOTE — ED Notes (Signed)
RT at bedside. SpO2 now at 94% on non-rebreather.

## 2014-06-28 NOTE — Consult Note (Signed)
[facility]  Reason for Consult:  Worsening renal and respiratory failure Referring Physician:  Lamonte Sakai MRN #:  [id number]  History of Present Illness: This is a 68 y.o. male who presents to the ED today for worsening dyspnea. He was last seen in the [facility] office yesterday by Dr. Oneida Alar for permanent access evaluation. He was scheduled for 07/10/14 for a right brachiocephalic AV fistula.  During his visit, his O2 saturation was 84%, but was asymptomatic. He says his shortness began to progress yesterday and came into the ER. He states that he has shortness of breath with exertion and is unable to perform his usual activities.    He also mentions some right hand numbness that started about 5 months ago. This pain occurs during repetitive motion.   He has a past medical history of hypertension medically managed with an ACEI and CCB. He also has insulin dependent diabetes. He has hypercholesterolemia managed on a statin. He takes a daily aspirin. He has no prior history of stroke.  He currently denies any chest pain or shortness of breath.    Past Medical History  Diagnosis Date  . Diabetes mellitus without complication   . Hypertension   . Hyperlipidemia   . Renal disorder    Past Surgical History  Procedure Laterality Date  . Back surgery      No Known Allergies  Prior to Admission medications   Medication Sig Start Date End Date Taking? Authorizing Provider  allopurinol (ZYLOPRIM) 100 MG tablet Take 1 tablet (100 mg total) by mouth daily. 10/20/12  Yes [name], MD  amLODipine (NORVASC) 10 MG tablet Take 10 mg by mouth daily.   Yes Historical Provider, MD  aspirin EC 81 MG tablet Take 81 mg by mouth daily.   Yes Historical Provider, MD  atorvastatin (LIPITOR) 20 MG tablet Take 20 mg by mouth daily.   Yes Historical Provider, MD  buPROPion (WELLBUTRIN XL) 150 MG 24 hr tablet Take 300 mg by mouth daily.   Yes Historical Provider, MD    calcitRIOL (ROCALTROL) 0.25 MCG capsule Take 0.25 mcg by mouth daily.   Yes Historical Provider, MD  furosemide (LASIX) 40 MG tablet Take 40 mg by mouth 2 (two) times daily.   Yes Historical Provider, MD  insulin NPH Human (NOVOLIN N RELION) 100 UNIT/ML injection Inject 20 Units into the skin at bedtime.   Yes Historical Provider, MD  insulin regular (NOVOLIN R RELION) 100 units/mL injection Inject 4-10 Units into the skin 3 (three) times daily before meals.   Yes Historical Provider, MD  pantoprazole (PROTONIX) 40 MG tablet Take 1 tablet (40 mg total) by mouth daily. 10/20/12  Yes [name], MD    History   Social History  . Marital Status: Single    Spouse Name: N/A    Number of Children: N/A  . Years of Education: N/A   Occupational History  . Not on file.   Social History Main Topics  . Smoking status: Former Smoker -- 5 years    Types: Cigarettes  . Smokeless tobacco: Never Used  . Alcohol Use: No  . Drug Use: No  . Sexual Activity: Not on file   Other Topics Concern  . Not on file   Social History Narrative  . No narrative on file    Family History  Problem Relation Age of Onset  . Diabetes Father     ROS: [x]  Positive   [ ]  Negative   [ ]   All sytems reviewed and are negative  Cardiovascular: []  chest pain/pressure []  palpitations []  SOB lying flat [x]  DOE []  pain in legs while walking []  pain in legs at rest []  pain in legs at night []  non-healing ulcers []  hx of DVT []  swelling in legs  Pulmonary: []  productive cough []  asthma/wheezing []  home O2  Neurologic: [x]  weakness in [x]  right hand []  legs [x]  numbness in [x]  right hand []  legs []  hx of CVA []  mini stroke [] difficulty speaking or slurred speech []  temporary loss of vision in one eye []  dizziness  Hematologic: []  hx of cancer []  bleeding problems []  problems with blood clotting easily  Endocrine:   []  diabetes []  thyroid disease  GI []  vomiting blood []  blood in  stool  GU: [x]  CKD/renal failure []  HD--[]  M/W/F or []  T/T/S []  burning with urination []  blood in urine  Psychiatric: []  anxiety []  depression  Musculoskeletal: []  arthritis []  joint pain  Integumentary: []  rashes []  ulcers  Constitutional: []  fever []  chills   Physical Examination  Filed Vitals:   06/28/14 1000  BP: 121/59  Pulse: 78  Temp:   Resp: 21   There is no weight on file to calculate BMI.  General:  WDWN in NAD, alert and oriented on bipap Gait: Not observed HENT: WNL, normocephalic Eyes: Pupils equal Pulmonary: on bipap, some rhonchi bilaterally Cardiac: regular, without  Murmurs, rubs or gallops; without carotid bruits Abdomen: soft, NT/ND, no masses Skin: without rashes, without ulcers  Vascular Exam/Pulses:  Right Left  Radial 2+ (normal) 2+ (normal)  DP 2+ (normal) 2+ (normal)  PT 1+ (weak) Non palpable   Extremities: without ischemic changes, without Gangrene , without cellulitis; without open wounds;  Musculoskeletal: no muscle wasting or atrophy  Neurologic: A&O X 3; Appropriate Affect ; SENSATION: normal; MOTOR FUNCTION:  Slight weakness in right hand grip. 5/5 strength all other extremities. Speech is fluent/normal   CBC    Component Value Date/Time   WBC 15.8* 06/28/2014 0755   RBC 3.44* 06/28/2014 0755   HGB 10.9* 06/28/2014 0818   HCT 32.0* 06/28/2014 0818   PLT 394 06/28/2014 0755   MCV 82.6 06/28/2014 0755   MCH 27.0 06/28/2014 0755   MCHC 32.7 06/28/2014 0755   RDW 13.8 06/28/2014 0755   LYMPHSABS 0.5* 06/28/2014 0755   MONOABS 1.5* 06/28/2014 0755   EOSABS 0.3 06/28/2014 0755   BASOSABS 0.0 06/28/2014 0755    BMET    Component Value Date/Time   NA 137 06/28/2014 0818   K 5.3 06/28/2014 0818   CL 112 06/28/2014 0818   CO2 10* 06/28/2014 0755   GLUCOSE 142* 06/28/2014 0818   BUN 94* 06/28/2014 0818   CREATININE 10.20* 06/28/2014 0818   CALCIUM 7.7* 06/28/2014 0755   GFRNONAA 5* 06/28/2014 0755   GFRAA 6*  06/28/2014 0755    COAGS: No results found for this basename: INR, PROTIME      ASSESSMENT: This is a 68 y.o. male with acute on chronic renal failure and acute respiratory failure.   PLAN: -Temporary catheter for emergent dialysis to be placed today by CCM. -We will plan for tunneled dialysis catheter placement and right brachiocephalic AVF for Monday [id number] with Dr. Bridgett Larsson.    Virgina Jock, PA-C [facility] Office: [phone] Pager: [phone]   I agree with the sbove. The patient was seen by Dr. Oneida Alar on 06/27/2014 and scheduled for right arm AVF.  He was admitted with respiratory failure and started on HD.  We will addd him to the schedule ffor fistula on Monday.  Annamarie Major

## 2014-06-28 NOTE — ED Provider Notes (Signed)
CSN: [id number]     Arrival date & time 06/28/14  0716 History   First MD Initiated Contact with Patient 06/28/14 [phone]     Chief Complaint  Patient presents with  . Shortness of Breath     (Consider location/radiation/quality/duration/timing/severity/associated sxs/prior Treatment) The history is provided by the patient. No language interpreter was used.  Blake [name] is a 68 y/o M with PMhx of DM, HTN, HLD presenting to the ED with shortness of breath that has been ongoing for the past month with worsening over the past 2 weeks. Patient reported that he has been out of breath more so with exertion. Stated that he has been having a mild cough. Denied fever, chills, nasal, congestion, nausea, vomiting.  PCP Dr. Ashby Dawes  Past Medical History  Diagnosis Date  . Diabetes mellitus without complication   . Hypertension   . Hyperlipidemia   . Renal disorder    Past Surgical History  Procedure Laterality Date  . Back surgery     Family History  Problem Relation Age of Onset  . Diabetes Father    History  Substance Use Topics  . Smoking status: Former Smoker -- 5 years    Types: Cigarettes  . Smokeless tobacco: Never Used  . Alcohol Use: No    Review of Systems  Constitutional: Negative for fever and chills.  Respiratory: Positive for cough and shortness of breath. Negative for chest tightness.   Neurological: Negative for weakness and headaches.      Allergies  Review of patient's allergies indicates no known allergies.  Home Medications   Prior to Admission medications   Medication Sig Start Date End Date Taking? Authorizing Provider  allopurinol (ZYLOPRIM) 100 MG tablet Take 1 tablet (100 mg total) by mouth daily. 10/20/12  Yes [name], MD  amLODipine (NORVASC) 10 MG tablet Take 10 mg by mouth daily.   Yes Historical Provider, MD  aspirin EC 81 MG tablet Take 81 mg by mouth daily.   Yes Historical Provider, MD  atorvastatin (LIPITOR) 20 MG tablet  Take 20 mg by mouth daily.   Yes Historical Provider, MD  buPROPion (WELLBUTRIN XL) 150 MG 24 hr tablet Take 300 mg by mouth daily.   Yes Historical Provider, MD  calcitRIOL (ROCALTROL) 0.25 MCG capsule Take 0.25 mcg by mouth daily.   Yes Historical Provider, MD  furosemide (LASIX) 40 MG tablet Take 40 mg by mouth 2 (two) times daily.   Yes Historical Provider, MD  insulin NPH Human (NOVOLIN N RELION) 100 UNIT/ML injection Inject 20 Units into the skin at bedtime.   Yes Historical Provider, MD  insulin regular (NOVOLIN R RELION) 100 units/mL injection Inject 4-10 Units into the skin 3 (three) times daily before meals.   Yes Historical Provider, MD  pantoprazole (PROTONIX) 40 MG tablet Take 1 tablet (40 mg total) by mouth daily. 10/20/12  Yes [name], MD   BP 121/59  Pulse 78  Temp(Src) 97.4 F (36.3 C) (Oral)  Resp 21  SpO2 96% Physical Exam  Nursing note and vitals reviewed. Constitutional: He is oriented to person, place, and time. He appears well-developed and well-nourished. No distress.  Patient appears in respiratory distress  HENT:  Head: Normocephalic and atraumatic.  Mouth/Throat: Oropharynx is clear and moist. No oropharyngeal exudate.  Eyes: Conjunctivae and EOM are normal. Pupils are equal, round, and reactive to light. Right eye exhibits no discharge. Left eye exhibits no discharge.  Neck: Normal range of motion. Neck supple. No tracheal  deviation present.  Cardiovascular: Normal rate, regular rhythm and normal heart sounds.  Exam reveals no friction rub.   No murmur heard. Pulses:      Radial pulses are 2+ on the right side, and 2+ on the left side.       Dorsalis pedis pulses are 2+ on the right side, and 2+ on the left side.  Cap refill less than 3 seconds Negative swelling or pitting edema identified to the lower extremities bilaterally  Pulmonary/Chest: He is in respiratory distress. He exhibits no tenderness.  Patient unable to speak in full sentences with  difficulty - patient has to take a deep breath after every word Use of accessory muscles  Musculoskeletal: Normal range of motion.  Full ROM to upper and lower extremities without difficulty noted, negative ataxia noted.  Lymphadenopathy:    He has no cervical adenopathy.  Neurological: He is alert and oriented to person, place, and time. No cranial nerve deficit. He exhibits normal muscle tone. Coordination normal.  Cranial nerves III-XII grossly intact Strength 5+/5+ to upper and lower extremities bilaterally with resistance applied, equal distribution noted Equal grip strength  Patient responds to questions appropriately  Patient follows commands well   Skin: Skin is warm and dry. No rash noted. He is not diaphoretic. No erythema.  Psychiatric: He has a normal mood and affect. His behavior is normal. Thought content normal.    ED Course  Procedures (including critical care time)  7:26 AM Attending physician, Dr. Towanda Malkin at bedside assessing patient.   8:09 AM Critical Care, Dr. Lamonte Sakai, consulted.   Results for orders placed during the hospital encounter of 06/28/14  CBC WITH DIFFERENTIAL      Result Value Ref Range   WBC 15.8 (*) 4.0 - 10.5 K/uL   RBC 3.44 (*) 4.22 - 5.81 MIL/uL   Hemoglobin 9.3 (*) 13.0 - 17.0 g/dL   HCT 28.4 (*) 39.0 - 52.0 %   MCV 82.6  78.0 - 100.0 fL   MCH 27.0  26.0 - 34.0 pg   MCHC 32.7  30.0 - 36.0 g/dL   RDW 13.8  11.5 - 15.5 %   Platelets 394  150 - 400 K/uL   Neutrophils Relative % 85 (*) 43 - 77 %   Neutro Abs 13.4 (*) 1.7 - 7.7 K/uL   Lymphocytes Relative 3 (*) 12 - 46 %   Lymphs Abs 0.5 (*) 0.7 - 4.0 K/uL   Monocytes Relative 10  3 - 12 %   Monocytes Absolute 1.5 (*) 0.1 - 1.0 K/uL   Eosinophils Relative 2  0 - 5 %   Eosinophils Absolute 0.3  0.0 - 0.7 K/uL   Basophils Relative 0  0 - 1 %   Basophils Absolute 0.0  0.0 - 0.1 K/uL  COMPREHENSIVE METABOLIC PANEL      Result Value Ref Range   Sodium 136 (*) 137 - 147 mEq/L   Potassium  5.6 (*) 3.7 - 5.3 mEq/L   Chloride 102  96 - 112 mEq/L   CO2 10 (*) 19 - 32 mEq/L   Glucose, Bld 140 (*) 70 - 99 mg/dL   BUN 92 (*) 6 - 23 mg/dL   Creatinine, Ser 9.37 (*) 0.50 - 1.35 mg/dL   Calcium 7.7 (*) 8.4 - 10.5 mg/dL   Total Protein 7.7  6.0 - 8.3 g/dL   Albumin 2.1 (*) 3.5 - 5.2 g/dL   AST 20  0 - 37 U/L   ALT 13  0 -  53 U/L   Alkaline Phosphatase 117  39 - 117 U/L   Total Bilirubin 0.3  0.3 - 1.2 mg/dL   GFR calc non Af Amer 5 (*) >90 mL/min   GFR calc Af Amer 6 (*) >90 mL/min   Anion gap 24 (*) 5 - 15  TROPONIN I      Result Value Ref Range   Troponin I <0.30  <0.30 ng/mL  PRO B NATRIURETIC PEPTIDE      Result Value Ref Range   Pro B Natriuretic peptide (BNP) 1556.0 (*) 0 - 125 pg/mL  I-STAT CG4 LACTIC ACID, ED      Result Value Ref Range   Lactic Acid, Venous 1.17  0.5 - 2.2 mmol/L  CBG MONITORING, ED      Result Value Ref Range   Glucose-Capillary 135 (*) 70 - 99 mg/dL  I-STAT ARTERIAL BLOOD GAS, ED      Result Value Ref Range   pH, Arterial 7.286 (*) 7.350 - 7.450   pCO2 arterial 24.2 (*) 35.0 - 45.0 mmHg   pO2, Arterial 134.0 (*) 80.0 - 100.0 mmHg   Bicarbonate 11.5 (*) 20.0 - 24.0 mEq/L   TCO2 12  0 - 100 mmol/L   O2 Saturation 99.0     Acid-base deficit 14.0 (*) 0.0 - 2.0 mmol/L   Patient temperature 37.0 C     Collection site RADIAL, ALLEN'S TEST ACCEPTABLE     Drawn by RT     Sample type ARTERIAL    I-STAT CHEM 8, ED      Result Value Ref Range   Sodium 137  137 - 147 mEq/L   Potassium 5.3  3.7 - 5.3 mEq/L   Chloride 112  96 - 112 mEq/L   BUN 94 (*) 6 - 23 mg/dL   Creatinine, Ser 10.20 (*) 0.50 - 1.35 mg/dL   Glucose, Bld 142 (*) 70 - 99 mg/dL   Calcium, Ion 0.94 (*) 1.13 - 1.30 mmol/L   TCO2 11  0 - 100 mmol/L   Hemoglobin 10.9 (*) 13.0 - 17.0 g/dL   HCT 32.0 (*) 39.0 - 52.0 %    Labs Review Labs Reviewed  CBC WITH DIFFERENTIAL - Abnormal; Notable for the following:    WBC 15.8 (*)    RBC 3.44 (*)    Hemoglobin 9.3 (*)    HCT 28.4 (*)     Neutrophils Relative % 85 (*)    Neutro Abs 13.4 (*)    Lymphocytes Relative 3 (*)    Lymphs Abs 0.5 (*)    Monocytes Absolute 1.5 (*)    All other components within normal limits  COMPREHENSIVE METABOLIC PANEL - Abnormal; Notable for the following:    Sodium 136 (*)    Potassium 5.6 (*)    CO2 10 (*)    Glucose, Bld 140 (*)    BUN 92 (*)    Creatinine, Ser 9.37 (*)    Calcium 7.7 (*)    Albumin 2.1 (*)    GFR calc non Af Amer 5 (*)    GFR calc Af Amer 6 (*)    Anion gap 24 (*)    All other components within normal limits  PRO B NATRIURETIC PEPTIDE - Abnormal; Notable for the following:    Pro B Natriuretic peptide (BNP) 1556.0 (*)    All other components within normal limits  CBG MONITORING, ED - Abnormal; Notable for the following:    Glucose-Capillary 135 (*)    All other components within  normal limits  I-STAT ARTERIAL BLOOD GAS, ED - Abnormal; Notable for the following:    pH, Arterial 7.286 (*)    pCO2 arterial 24.2 (*)    pO2, Arterial 134.0 (*)    Bicarbonate 11.5 (*)    Acid-base deficit 14.0 (*)    All other components within normal limits  I-STAT CHEM 8, ED - Abnormal; Notable for the following:    BUN 94 (*)    Creatinine, Ser 10.20 (*)    Glucose, Bld 142 (*)    Calcium, Ion 0.94 (*)    Hemoglobin 10.9 (*)    HCT 32.0 (*)    All other components within normal limits  CULTURE, BLOOD (ROUTINE X 2)  CULTURE, BLOOD (ROUTINE X 2)  URINE CULTURE  CULTURE, RESPIRATORY (NON-EXPECTORATED)  TROPONIN I  BLOOD GAS, ARTERIAL  PROCALCITONIN  TROPONIN I  TROPONIN I  TROPONIN I  URINALYSIS, ROUTINE W REFLEX MICROSCOPIC  STREP PNEUMONIAE URINARY ANTIGEN  BLOOD GAS, ARTERIAL  I-STAT CG4 LACTIC ACID, ED    Imaging Review Dg Chest Port 1 View  06/28/2014   CLINICAL DATA:  Shortness of breath.  EXAM: PORTABLE CHEST - 1 VIEW  COMPARISON:  02/04/ 2014.  FINDINGS: Mediastinum hilar structures are normal. Diffuse infiltrate right lung with associated atelectatic  changes. Mild infiltrate left lung base cannot be excluded. Small right pleural effusion. No pneumothorax. Cardiomegaly. Normal pulmonary vascularity. No acute bony abnormality.  IMPRESSION: 1. Diffuse infiltrate right lung consistent with pneumonia. Associated atelectatic changes right lung. Small right pleural effusion. 2. Mild infiltrate left lung base cannot be excluded. 3. Cardiomegaly with normal pulmonary vascularity.   Electronically Signed   By: Marcello Moores  Register   On: 06/28/2014 07:56     EKG Interpretation   Date/Time:  Friday June 28 2014 07:25:08 EDT Ventricular Rate:  89 PR Interval:  167 QRS Duration: 90 QT Interval:  387 QTC Calculation: 471 R Axis:   37 Text Interpretation:  Normal sinus rhythm Artifact Baseline wander When  compared with ECG of 10/17/2012 No significant change was found Confirmed by  [facility]  MD, Nunzio Cory [phone]) on 06/28/2014 7:31:20 AM      MDM   Final diagnoses:  Shortness of breath  Dyspnea  CAP (community acquired pneumonia)  Hypervolemia, unspecified hypervolemia type  Pleural effusion  Acute renal failure, unspecified acute renal failure type    Medications  vancomycin (VANCOCIN) 2,000 mg in sodium chloride 0.9 % 500 mL IVPB (not administered)  aspirin chewable tablet 324 mg (not administered)    Or  aspirin suppository 300 mg (not administered)  0.9 %  sodium chloride infusion (not administered)  insulin aspart (novoLOG) injection 0-20 Units (not administered)  heparin injection 5,000 Units (not administered)  0.9 %  sodium chloride infusion (not administered)  pantoprazole (PROTONIX) injection 40 mg (not administered)  amLODipine (NORVASC) tablet 10 mg (not administered)  aspirin EC tablet 81 mg (not administered)  atorvastatin (LIPITOR) tablet 20 mg (not administered)  buPROPion (WELLBUTRIN XL) 24 hr tablet 300 mg (not administered)  calcitRIOL (ROCALTROL) capsule 0.25 mcg (not administered)  cefTRIAXone (ROCEPHIN) 1 g in  dextrose 5 % 50 mL IVPB (0 g Intravenous Stopped 06/28/14 0859)  azithromycin (ZITHROMAX) 500 mg in dextrose 5 % 250 mL IVPB (500 mg Intravenous Transfusing/Transfer 06/28/14 1009)    Filed Vitals:   06/28/14 0915 06/28/14 0930 06/28/14 0945 06/28/14 1000  BP: 131/55 133/64 129/62 121/59  Pulse: 80 92 81 78  Temp:      TempSrc:  Resp: 21 33 20 21  SpO2: 97% 97% 97% 96%   This provider reviewed the patient's chart. Patient was last seen in the ED setting and admitted to the hospital for acute renal failure back in 10/2012. Patient was seen by Vascular yesterday - right arm being saved for dialysis. Patient's pulse ox was 84% on room air at that time of vitals.  EKG noted normal sinus rhythm with a heart rate of 89 beats per minute-no significant change since last tracing. Troponin negative elevation. BNP elevated at 1556.0. Glucose 135. CBC noted elevated white blood cell count of 15.8. Hemoglobin 9.3, hematocrit 28.4. Sodium mildly low 136, elevated potassium of 5.6-negative peaked T waves noted in EKG. BUN 92, creatinine 9.37. Anion gap of 24.0 mEq per liter. ABG noted bicarbonate of 11.5, pH of 7.286. Lactic acid 1.17. Chest x-ray noted diffuse infiltrate in the right lung consistent with pneumonia with small right pleural effusion. Mild infiltrate noted in the left lung base the cannot be excluded. Cardiomegaly with normal pulmonary vascularity. Upon arrival to the ED, patient's pulse ox was 46% on room air - patient was placed on non-airway breath and then on bipap. Patient breathing comfortably on bi-pap.  Patient presenting to the ED with CAP - started on IV antibiotics. Patient to be admitted to ICU - Critical Care. Patient will most likely need emergent dialysis. Patient stable for transfer.   Jamse Mead, PA-C 06/28/14 1034

## 2014-06-28 NOTE — Progress Notes (Signed)
Patient transported to dialysis and back to [id number] with no complications. Placed on 4lnc when returned to room. Vital signs stable at this time. RN aware. RT will continue to monitor.

## 2014-06-28 NOTE — ED Provider Notes (Signed)
Medical screening examination/treatment/procedure(s) were conducted as a shared visit with non-physician practitioner(s) and myself.  I personally evaluated the patient during the encounter.  68yo M, c/o gradual onset and worsening of persistent SOB for the past 7 months, worse over the past 1 month. SOB worsens on exertion. States he was walking in his house this morning and "fell out" because he was "weak." Endorses frequent falling due to progressive generalized weakness over the past 7 months, as well as right hand weakness that began 7 months ago.  Pt is vague historian and it is unclear if he has been evaluated by is PMD for these symptoms. States he was evaluated by Vascular MD yesterday and is to have an AV fistula placed on 07/10/2014 and start HD for CKD. EPIC chart reviewed: Pt's Sats in Vasc ofc 84% R/A yesterday, last hospital contact was 10/2012. Pt arrived to ED tachypneic with Sats 46% R/A, c/o generalized fatigue and SOB. NRB O2 100% placed with Sats increasing to 95-98% R/A. A&O, BP/HR stable, lungs coarse bilat, speaking in sentences, sitting upright, RRR, abd soft/NT, neuro non-focal. Pt placed on Bipap with Sats remaining 96-100%. Intubation d/w pt and family: are agreeable to same "if you really need to." ABG with metabolic acidosis, EKG without peaked T-waves, CXR with pulm vasc congestion and CAP. BUN/Cr significantly elevated from previous, potassium normal. H/H per baseline. IV abx started. Pt will likely need emergent HD. 0815:  T/C to PCCM Dr. Lamonte Sakai, case discussed, including:  HPI, pertinent PM/SHx, VS/PE, dx testing, ED course and treatment:  Agreeable to admit, requests they will come to ED for evaluation for admission.     MDM Reviewed: previous chart, nursing note and vitals Reviewed previous: labs and ECG Interpretation: labs, ECG and x-ray Total time providing critical care: 30-74 minutes. This excludes time spent performing separately reportable procedures and  services. Consults: critical care   CRITICAL CARE Performed by: Alfonzo Feller Total critical care time: 40 Critical care time was exclusive of separately billable procedures and treating other patients. Critical care was necessary to treat or prevent imminent or life-threatening deterioration. Critical care was time spent personally by me on the following activities: development of treatment plan with patient and/or surrogate as well as nursing, discussions with consultants, evaluation of patient's response to treatment, examination of patient, obtaining history from patient or surrogate, ordering and performing treatments and interventions, ordering and review of laboratory studies, ordering and review of radiographic studies, pulse oximetry and re-evaluation of patient's condition.    EKG Interpretation   Date/Time:  Friday June 28 2014 07:25:08 EDT Ventricular Rate:  89 PR Interval:  167 QRS Duration: 90 QT Interval:  387 QTC Calculation: 471 R Axis:   37 Text Interpretation:  Normal sinus rhythm Artifact Baseline wander When  compared with ECG of 10/17/2012 No significant change was found Confirmed by  [facility]  MD, Nunzio Cory [phone]) on 06/28/2014 7:31:20 AM      Results for orders placed during the hospital encounter of 06/28/14  CBC WITH DIFFERENTIAL      Result Value Ref Range   WBC 15.8 (*) 4.0 - 10.5 K/uL   RBC 3.44 (*) 4.22 - 5.81 MIL/uL   Hemoglobin 9.3 (*) 13.0 - 17.0 g/dL   HCT 28.4 (*) 39.0 - 52.0 %   MCV 82.6  78.0 - 100.0 fL   MCH 27.0  26.0 - 34.0 pg   MCHC 32.7  30.0 - 36.0 g/dL   RDW 13.8  11.5 - 15.5 %  Platelets 394  150 - 400 K/uL   Neutrophils Relative % 85 (*) 43 - 77 %   Neutro Abs 13.4 (*) 1.7 - 7.7 K/uL   Lymphocytes Relative 3 (*) 12 - 46 %   Lymphs Abs 0.5 (*) 0.7 - 4.0 K/uL   Monocytes Relative 10  3 - 12 %   Monocytes Absolute 1.5 (*) 0.1 - 1.0 K/uL   Eosinophils Relative 2  0 - 5 %   Eosinophils Absolute 0.3  0.0 - 0.7 K/uL    Basophils Relative 0  0 - 1 %   Basophils Absolute 0.0  0.0 - 0.1 K/uL  COMPREHENSIVE METABOLIC PANEL      Result Value Ref Range   Sodium 136 (*) 137 - 147 mEq/L   Potassium 5.6 (*) 3.7 - 5.3 mEq/L   Chloride 102  96 - 112 mEq/L   CO2 10 (*) 19 - 32 mEq/L   Glucose, Bld 140 (*) 70 - 99 mg/dL   BUN 92 (*) 6 - 23 mg/dL   Creatinine, Ser 9.37 (*) 0.50 - 1.35 mg/dL   Calcium 7.7 (*) 8.4 - 10.5 mg/dL   Total Protein 7.7  6.0 - 8.3 g/dL   Albumin 2.1 (*) 3.5 - 5.2 g/dL   AST 20  0 - 37 U/L   ALT 13  0 - 53 U/L   Alkaline Phosphatase 117  39 - 117 U/L   Total Bilirubin 0.3  0.3 - 1.2 mg/dL   GFR calc non Af Amer 5 (*) >90 mL/min   GFR calc Af Amer 6 (*) >90 mL/min   Anion gap 24 (*) 5 - 15  TROPONIN I      Result Value Ref Range   Troponin I <0.30  <0.30 ng/mL  PRO B NATRIURETIC PEPTIDE      Result Value Ref Range   Pro B Natriuretic peptide (BNP) 1556.0 (*) 0 - 125 pg/mL  I-STAT CG4 LACTIC ACID, ED      Result Value Ref Range   Lactic Acid, Venous 1.17  0.5 - 2.2 mmol/L  CBG MONITORING, ED      Result Value Ref Range   Glucose-Capillary 135 (*) 70 - 99 mg/dL  I-STAT ARTERIAL BLOOD GAS, ED      Result Value Ref Range   pH, Arterial 7.286 (*) 7.350 - 7.450   pCO2 arterial 24.2 (*) 35.0 - 45.0 mmHg   pO2, Arterial 134.0 (*) 80.0 - 100.0 mmHg   Bicarbonate 11.5 (*) 20.0 - 24.0 mEq/L   TCO2 12  0 - 100 mmol/L   O2 Saturation 99.0     Acid-base deficit 14.0 (*) 0.0 - 2.0 mmol/L   Patient temperature 37.0 C     Collection site RADIAL, ALLEN'S TEST ACCEPTABLE     Drawn by RT     Sample type ARTERIAL    I-STAT CHEM 8, ED      Result Value Ref Range   Sodium 137  137 - 147 mEq/L   Potassium 5.3  3.7 - 5.3 mEq/L   Chloride 112  96 - 112 mEq/L   BUN 94 (*) 6 - 23 mg/dL   Creatinine, Ser 10.20 (*) 0.50 - 1.35 mg/dL   Glucose, Bld 142 (*) 70 - 99 mg/dL   Calcium, Ion 0.94 (*) 1.13 - 1.30 mmol/L   TCO2 11  0 - 100 mmol/L   Hemoglobin 10.9 (*) 13.0 - 17.0 g/dL   HCT 32.0 (*) 39.0  - 52.0 %   Dg  Chest Port 1 View 06/28/2014   CLINICAL DATA:  Shortness of breath.  EXAM: PORTABLE CHEST - 1 VIEW  COMPARISON:  02/04/ 2014.  FINDINGS: Mediastinum hilar structures are normal. Diffuse infiltrate right lung with associated atelectatic changes. Mild infiltrate left lung base cannot be excluded. Small right pleural effusion. No pneumothorax. Cardiomegaly. Normal pulmonary vascularity. No acute bony abnormality.  IMPRESSION: 1. Diffuse infiltrate right lung consistent with pneumonia. Associated atelectatic changes right lung. Small right pleural effusion. 2. Mild infiltrate left lung base cannot be excluded. 3. Cardiomegaly with normal pulmonary vascularity.   Electronically Signed   By: Marcello Moores  Register   On: 06/28/2014 07:56    Results for MANDY, ENGLIN (MRN [id number]) as of 06/28/2014 08:22  Ref. Range 10/19/2012 05:30 10/20/2012 06:12 06/28/2014 08:18  BUN Latest Range: 6-23 mg/dL 25 (H) 23 94 (H)  Creatinine Latest Range: 0.50-1.35 mg/dL 2.43 (H) 2.29 (H) 10.20 (H)    Results for TARRIN, WEIDEL (MRN [id number]) as of 06/28/2014 08:22  Ref. Range 10/18/2012 05:20 06/28/2014 07:55 06/28/2014 08:18  Hemoglobin Latest Range: 13.0-17.0 g/dL 12.9 (L) 9.3 (L) 10.9 (L)  HCT Latest Range: 39.0-52.0 % 39.8 28.4 (L) 32.0 (L)    Francine Graven, DO 07/01/14 1720

## 2014-06-28 NOTE — Consult Note (Signed)
Reason for Consult: Acute on chronic renal failure Referring Physician: Dr. Lamonte Sakai, CCM  Blake [name] is a 68 y.o. male.  HPI: Level 5 caveat/Limited secondary to pt drowsiness. Most of HPI per niece at bedside and chart review. Pt presented to ED for SOB for the past month. Vitals on presentation with O2 sat 47%, now improved to mid-90s on BiPAP. Pt unable to report about his ability to make urine. He was seen by vascular surgery, Dr. Oneida Alar [facility], on 10/15, with planned AV fistula creation on 07/10/14. Asked by CCM to consult for acute on chronic RF and assess need for dialysis. 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 Moshe Cipro from our office.  His Scr has increased over the last year from 4.79 to the 6's to 7's and most recently up to over 8.  His lisinopril was held and he was referred to [facility] for access placement however he rescheduled and never followed up.  He presented to [facility] with increasing SOB and was found to have a Scr of 10.2 and K of 5.6.  We were asked to help manage his progressive CKD stage 5 and possibly initiate hemodialysis.  Trend in Creatinine: Creatinine, Ser  Date/Time Value Ref Range Status  06/28/2014  8:18 AM 10.20* 0.50 - 1.35 mg/dL Final  06/28/2014  7:55 AM 9.37* 0.50 - 1.35 mg/dL Final  10/20/2012  6:12 AM 2.29* 0.50 - 1.35 mg/dL Final  10/19/2012  5:30 AM 2.43* 0.50 - 1.35 mg/dL Final  10/18/2012  5:20 AM 2.47* 0.50 - 1.35 mg/dL Final  10/17/2012  8:14 AM 2.32* 0.50 - 1.35 mg/dL Final    PMH:   Past Medical History  Diagnosis Date  . Diabetes mellitus without complication   . Hypertension   . Hyperlipidemia   . Renal disorder     PSH:   Past Surgical History  Procedure Laterality Date  . Back surgery      Allergies: No Known Allergies  Medications:   Prior to Admission medications   Medication Sig Start Date End Date Taking? Authorizing Provider   allopurinol (ZYLOPRIM) 100 MG tablet Take 1 tablet (100 mg total) by mouth daily. 10/20/12  Yes [name], MD  amLODipine (NORVASC) 10 MG tablet Take 10 mg by mouth daily.   Yes Historical Provider, MD  aspirin EC 81 MG tablet Take 81 mg by mouth daily.   Yes Historical Provider, MD  atorvastatin (LIPITOR) 20 MG tablet Take 20 mg by mouth daily.   Yes Historical Provider, MD  buPROPion (WELLBUTRIN XL) 150 MG 24 hr tablet Take 300 mg by mouth daily.   Yes Historical Provider, MD  calcitRIOL (ROCALTROL) 0.25 MCG capsule Take 0.25 mcg by mouth daily.   Yes Historical Provider, MD  furosemide (LASIX) 40 MG tablet Take 40 mg by mouth 2 (two) times daily.   Yes Historical Provider, MD  insulin NPH Human (NOVOLIN N RELION) 100 UNIT/ML injection Inject 20 Units into the skin at bedtime.   Yes Historical Provider, MD  insulin regular (NOVOLIN R RELION) 100 units/mL injection Inject 4-10 Units into the skin 3 (three) times daily before meals.   Yes Historical Provider, MD  pantoprazole (PROTONIX) 40 MG tablet Take 1 tablet (40 mg total) by mouth daily. 10/20/12  Yes [name], MD    Inpatient medications:    Discontinued Meds:   Medications Discontinued During This Encounter  Medication Reason  . insulin lispro (HUMALOG) 100 UNIT/ML injection Inpatient Standard  . lisinopril (PRINIVIL,ZESTRIL) 30 MG tablet Patient has not taken in last 30 days    Social History:  reports that he has quit smoking. His smoking use included Cigarettes. He smoked 0.00 packs per day for 5 years. He has never used smokeless tobacco. He reports that he does not drink alcohol or use illicit drugs.  Family History:   Family History  Problem Relation Age of Onset  . Diabetes Father     Review of systems not obtained due to patient factors. Weight change:  No intake or output data in the 24 hours ending 06/28/14 0855 BP 143/61  Pulse 79  Temp(Src) 97.4 F (36.3 C) (Oral)  Resp 25  SpO2 96% Filed Vitals:    06/28/14 0800 06/28/14 0807 06/28/14 0815 06/28/14 0830  BP: 148/61  148/60 143/61  Pulse: 81 83 83 79  Temp:      TempSrc:      Resp: 22 27 23 25   SpO2: 100% 96% 96% 96%     General appearance: appears stated age, morbidly obese and slowed mentation/drowsy, does not respond to many questions wearing BiPap.  Head: Normocephalic, without obvious abnormality, atraumatic, BiPAP in place Resp: diminished breath sounds RLL and rales RLL Chest wall: no tenderness Cardio: regular rate and rhythm, S1, S2 normal, no murmur, click, rub or gallop GI: soft, non-tender; bowel sounds normal; no masses,  no organomegaly Extremities: trace edema bilaterally, warm and well perfused Pulses: 2+ and symmetric Skin: Skin color, texture, turgor normal. No rashes or lesions Neurologic: somnolent but arousable to voice, quickly goes back to sleep; mumbles responses to questions.  Labs: Basic Metabolic Panel:  Recent Labs Lab 06/28/14 0755 06/28/14 0818  NA 136* 137  K 5.6* 5.3  CL 102 112  CO2 10*  --   GLUCOSE 140* 142*  BUN 92* 94*  CREATININE 9.37* 10.20*  ALBUMIN 2.1*  --   CALCIUM 7.7*  --    Liver Function Tests:  Recent Labs Lab 06/28/14 0755  AST 20  ALT 13  ALKPHOS 117  BILITOT 0.3  PROT 7.7  ALBUMIN 2.1*   No results found for this basename: LIPASE, AMYLASE,  in the last 168 hours No results found for this basename: AMMONIA,  in the last 168 hours CBC:  Recent Labs Lab 06/28/14 0755 06/28/14 0818  WBC 15.8*  --   NEUTROABS 13.4*  --   HGB 9.3* 10.9*  HCT 28.4* 32.0*  MCV 82.6  --   PLT 394  --    PT/INR: @LABRCNTIP (inr:5) Cardiac Enzymes: ) Recent Labs Lab 06/28/14 0755  TROPONINI <0.30   CBG:  Recent Labs Lab 06/28/14 0753  GLUCAP 135*    Iron Studies: No results found for this basename: IRON, TIBC, TRANSFERRIN, FERRITIN,  in the last 168 hours  Xrays/Other Studies: Dg Chest Port 1 View  06/28/2014   CLINICAL DATA:  Shortness of breath.   EXAM: PORTABLE CHEST - 1 VIEW  COMPARISON:  02/04/ 2014.  FINDINGS: Mediastinum hilar structures are normal. Diffuse infiltrate right lung with associated atelectatic changes. Mild infiltrate left lung base cannot be excluded. Small right pleural effusion. No pneumothorax. Cardiomegaly. Normal pulmonary vascularity. No acute bony abnormality.  IMPRESSION: 1. Diffuse infiltrate right lung consistent with pneumonia. Associated atelectatic changes right lung. Small right pleural effusion. 2. Mild infiltrate left lung base cannot be excluded. 3. Cardiomegaly with normal pulmonary vascularity.   Electronically Signed   By:  Gage   On: 06/28/2014 07:56     Assessment/Plan: 1. CAP: abx per CCM 2. Acute on chronic renal failure: significant worsening compared to prior in epic from 10/2012. will need temporary access for HD before permanent access. 1. Pt has had progressive CKD over the last year and is now at ESRD.  Will plan for urgent HD to assist with hypoxia and hyperkalemia 2. Appreciate PCCM for placement of temp trialysis catheter 3. Will ask VVS to place tunneled HD cath and AVF/AVG early next week 4. Will start CLIP process for placement 5. Will check HepBsAg, Ab, and cAb 3. Anemia of chronic disease 4. SHPTH/ metabolic bone disease- will check Ca/Phos/iPTH 5. Vascular access- as above  6. AG metabolic acidosis d-d [id number]. 1. Plan for HD later today 7. Hyperkalemia: mild 1. HD as above 8. Diabetes: last a1c in epic 9.7 on 10/2012 9. HTN: on amlodipine, lasix at home. At goal currently, continuing amlodipine.   Tawanna Sat 06/28/2014, 8:55 AM

## 2014-06-28 NOTE — ED Notes (Signed)
Pt SpO2 47% on RA. Placed on non-rebreather.  Notified Dr Thurnell Garbe and RT.

## 2014-06-28 NOTE — H&P (Signed)
PULMONARY / CRITICAL CARE MEDICINE   Name: Blake [name] MRN: [id number] DOB: 27-Oct-1945    ADMISSION DATE:  06/28/2014  REFERRING MD :  Dr. Thurnell Garbe   CHIEF COMPLAINT:  Respiratory Distress, Renal Failure   INITIAL PRESENTATION: 68 y/o M with PMH of CKD and recent work up for AVF placement who presented to [facility] ER on 10/16 with increased SOB, fatigue and hypoxemia.  Found to have a metabolic acidosis, significant worsening of renal failure and respiratory distress.  STUDIES:  10/16  CXR > R sided infiltrate vs edema   SIGNIFICANT EVENTS: 10/16  Admit to [facility] with Respiratory Failure +/- CAP, AKI   HISTORY OF PRESENT ILLNESS:   68 y/o M, non-smoker, with PMH of HTN, HLD, DM, CKD with recent work up by Dr. Oneida Alar for AVF placement and planned start of HD in the near future who presented to [facility] ER on 10/15 with complaints of worsening shortness of breath x 1 month.  Specifically it has worsened over the past week prior to presentation.  He was seen in office by Dr. Oneida Alar on 10/15 for review of potential AVF placement.  Office notes at that time reflect he was 84% on RA.  Per notes, he was planned to have a graft placed on 10/28.  Niece (who lives with the patient) reports he has had a couple of falls over the last few months.  At baseline, he continues to drive and is able to perform ADL's.    On presentation to the ER, he was noted to have significant shortness of breath and saturations of 47%.  He was placed on BiPAP for increased work of breathing.  CXR was concerning for R sided airspace disease (edema vs infiltrate).  Patient denies fevers, chills, cough, sputum production, chest pain, n/v/d.  Labs:  Na 137, K 5.3, BUN 94, Sr CR 10.20, lactate 1.17, BNP 1556 and Hgb 10.9.  PCCM called for ICU admission.   PAST MEDICAL HISTORY :   has a past medical history of Diabetes mellitus without complication; Hypertension; Hyperlipidemia; and Renal disorder.  has past surgical history that  includes Back surgery.  HOME MEDICATIONS:  Prior to Admission medications   Medication Sig Start Date End Date Taking? Authorizing Provider  allopurinol (ZYLOPRIM) 100 MG tablet Take 1 tablet (100 mg total) by mouth daily. 10/20/12  Yes [name], MD  amLODipine (NORVASC) 10 MG tablet Take 10 mg by mouth daily.   Yes Historical Provider, MD  aspirin EC 81 MG tablet Take 81 mg by mouth daily.   Yes Historical Provider, MD  atorvastatin (LIPITOR) 20 MG tablet Take 20 mg by mouth daily.   Yes Historical Provider, MD  buPROPion (WELLBUTRIN XL) 150 MG 24 hr tablet Take 300 mg by mouth daily.   Yes Historical Provider, MD  calcitRIOL (ROCALTROL) 0.25 MCG capsule Take 0.25 mcg by mouth daily.   Yes Historical Provider, MD  furosemide (LASIX) 40 MG tablet Take 40 mg by mouth 2 (two) times daily.   Yes Historical Provider, MD  insulin NPH Human (NOVOLIN N RELION) 100 UNIT/ML injection Inject 20 Units into the skin at bedtime.   Yes Historical Provider, MD  insulin regular (NOVOLIN R RELION) 100 units/mL injection Inject 4-10 Units into the skin 3 (three) times daily before meals.   Yes Historical Provider, MD  pantoprazole (PROTONIX) 40 MG tablet Take 1 tablet (40 mg total) by mouth daily. 10/20/12  Yes [name], MD   No Known Allergies  FAMILY HISTORY:  has no family status information on file.   SOCIAL HISTORY:  reports that he has quit smoking. His smoking use included Cigarettes. He smoked 0.00 packs per day for 5 years. He has never used smokeless tobacco. He reports that he does not drink alcohol or use illicit drugs.  REVIEW OF SYSTEMS:  Information obtained from family at bedside.  Patient unable to perform ROS due to AMS.   SUBJECTIVE:   VITAL SIGNS: Temp:  [97.4 F (36.3 C)] 97.4 F (36.3 C) (10/16 0730) Pulse Rate:  [80-94] 83 (10/16 0815) Resp:  [20-28] 23 (10/16 0815) BP: (126-158)/(53-66) 148/60 mmHg (10/16 0815) SpO2:  [46 %-100 %] 96 % (10/16 0815) Weight:  [249  lb (112.946 kg)] 249 lb (112.946 kg) (10/15 1500)  HEMODYNAMICS:    VENTILATOR SETTINGS:    INTAKE / OUTPUT: No intake or output data in the 24 hours ending 06/28/14 [id number]  PHYSICAL EXAMINATION: General:  wdwn adult male in NAD on bipap  Neuro:  Somnolent but arouses to voice, appropriate but drifts back to sleep  HEENT:  Mm pink/dry, bipap in place Cardiovascular:  s1s2 rrr, no m/r/g  Lungs:  resp's even/non-labored, few scattered rhonchi on R, left essentially clear  Abdomen:  Round/soft, bsx4 active  Musculoskeletal:  No acute deformities Skin:  Warm/dry, no overt edema   LABS:  CBC  Recent Labs Lab 06/28/14 0755 06/28/14 0818  WBC 15.8*  --   HGB 9.3* 10.9*  HCT 28.4* 32.0*  PLT 394  --    Coag's No results found for this basename: APTT, INR,  in the last 168 hours  BMET  Recent Labs Lab 06/28/14 0818  NA 137  K 5.3  CL 112  BUN 94*  CREATININE 10.20*  GLUCOSE 142*   Electrolytes No results found for this basename: CALCIUM, MG, PHOS,  in the last 168 hours  Sepsis Markers  Recent Labs Lab 06/28/14 0804  LATICACIDVEN 1.17   ABG  Recent Labs Lab 06/28/14 0756  PHART 7.286*  PCO2ART 24.2*  PO2ART 134.0*   Liver Enzymes No results found for this basename: AST, ALT, ALKPHOS, BILITOT, ALBUMIN,  in the last 168 hours Cardiac Enzymes No results found for this basename: TROPONINI, PROBNP,  in the last 168 hours Glucose  Recent Labs Lab 06/28/14 0753  GLUCAP 135*    Imaging No results found.   ASSESSMENT / PLAN:  PULMONARY OETT A: Acute Hypoxemic Respiratory Failure - in the setting of renal failure +/- CAP R/O CAP  P:   See ID BiPAP PRN for increased WOB, desaturations Titrate O2 to keep sats > 92%  CARDIOVASCULAR CVL A:  Hypertension  HLD P:  Continue Norvasc, Lipitor, ASA ICU monitoring   RENAL A:   Acute on Chronic Kidney Disease Uremia P:   Nephrology Consult Place temp cath for emergent HD Trend BMP VVS  notified   GASTROINTESTINAL A:   At Risk Aspiration - with acute encephalopathy  P:   NPO while on BiPAP Allow diet once mental status resolves Continue home protoninx  HEMATOLOGIC A:   Anemia  P:  Trend CBC Heparin for DVT prophylaxis   INFECTIOUS A:   Rule out CAP - cxr with R airspace disease, volume vs infiltrate  P:   BCx2 10/16 >>  UC  10/16 >>  Sputum 10/16 >>  U. Strep 10/16 >>   Abx: Vanco, start date 10/16, day 1/1  Abx: Rocephin, start date 10/16, day 1/x Abx: Azithro, start date 10/16 ,  day 1/x  PCT protocol   ENDOCRINE A:   DM  Gout P:   SSI  Hold home NPH, R  Hold allopurinol   NEUROLOGIC A:   Acute Encephalopathy - in the setting of uremia / AKI P:   RASS goal:  Supportive care, hopeful mental status will resolved with HD   Family updated: Niece updated at bedside.    Interdisciplinary Family Meeting v Palliative Care Meeting: n/a   TODAY'S SUMMARY: 68 y/o M with CKD in process of graft placement admitted with worsening renal failure, +/- CAP.  Rx for CAP, place temp HD cath.  Notify Dr. Oneida Alar of patients admission.    Noe Gens, NP-C [facility] Pgr: [phone] or [phone]   I have personally obtained a history, examined the patient, evaluated laboratory and imaging results, formulated the assessment and plan and placed orders.  CRITICAL CARE: The patient is critically ill with multiple organ systems failure and requires high complexity decision making for assessment and support, frequent evaluation and titration of therapies, application of advanced monitoring technologies and extensive interpretation of multiple databases. Critical Care Time devoted to patient care services described in this note is 60 minutes.   Baltazar Apo, MD, PhD 06/28/2014, 9:41 AM [facility] [phone] or if no answer [phone]

## 2014-06-28 NOTE — ED Notes (Signed)
Dr. McManus at bedside. 

## 2014-06-28 NOTE — Progress Notes (Addendum)
ANTIBIOTIC CONSULT NOTE - INITIAL  Pharmacy Consult for vancomycin Indication: pneumonia  No Known Allergies  Patient Measurements:  TBW 112.9 kg on 06/1514 Adjusted Body Weight: 87.5 kg  Vital Signs: Temp: 97.4 F (36.3 C) (10/16 0730) Temp Source: Oral (10/16 0730) BP: 143/61 mmHg (10/16 0830) Pulse Rate: 79 (10/16 0830) Intake/Output from previous day:   Intake/Output from this shift:    Labs:  Recent Labs  06/28/14 0755 06/28/14 0818  WBC 15.8*  --   HGB 9.3* 10.9*  PLT 394  --   CREATININE  --  10.20*   The CrCl is unknown because both a height and weight (above a minimum accepted value) are required for this calculation. No results found for this basename: VANCOTROUGH, VANCOPEAK, VANCORANDOM, GENTTROUGH, GENTPEAK, GENTRANDOM, TOBRATROUGH, TOBRAPEAK, TOBRARND, AMIKACINPEAK, AMIKACINTROU, AMIKACIN,  in the last 72 hours   Microbiology: No results found for this or any previous visit (from the past 720 hour(s)).  Medical History: Past Medical History  Diagnosis Date  . Diabetes mellitus without complication   . Hypertension   . Hyperlipidemia   . Renal disorder     Medications:  No abx  Assessment: 68 yo M in ED with SOB and "fell out" at home due to weakness.  Pt scheduled to have AV fistula placed 07/10/14 to start HD for CKD.  Pharmacy consulted to dose vancomycin for PNA.  Wt 112.9 kg. ABW 87.5 kg, ESRD - to start HD ASAP; WBC 15.8, afebrile.  10/16 CXR: Diffuse infiltrate right lung consistent with pneumonia  Rocephin 1 gm x1 in ED 10/16 vanc 10/16>>  Goal of Therapy:  Pre-HD vanc level 15-25 mcg/ml  Plan:  -vancomycin 2000 mg IV load x 1 dose now -f/u for HD schedule (new start) - once on stable HD he will need vancomycin 1000 mg after each HD session - f/u wbc, temp, culture data, HD sessions/BFR ect -pre-HD vanc levels or random vanc level as needed to guide dosing  Eudelia Bunch, Pharm.D. [phone] 06/28/2014 8:51  AM  ---------------------------------------------------------------------------------------- Addendum:  Renal consulted - temporary HD access placed and noted plans for HD today - ordered for 2.5 hr at BFR 200. Will give a small dose of Vancomycin this evening post-HD  Plan 1. Vancomycin 500 mg post HD on 10/16 evening 2. Will continue to follow HD schedule/duration, culture results, LOT, and antibiotic de-escalation plans   Alycia Rossetti, PharmD, BCPS Clinical Pharmacist Pager: [phone] 06/28/2014 1:19 PM

## 2014-06-28 NOTE — ED Notes (Signed)
Lab results given to [facility].

## 2014-06-29 ENCOUNTER — Inpatient Hospital Stay (HOSPITAL_COMMUNITY): Payer: Medicare Other

## 2014-06-29 DIAGNOSIS — N184 Chronic kidney disease, stage 4 (severe): Secondary | ICD-10-CM

## 2014-06-29 DIAGNOSIS — J9601 Acute respiratory failure with hypoxia: Secondary | ICD-10-CM

## 2014-06-29 DIAGNOSIS — J810 Acute pulmonary edema: Secondary | ICD-10-CM

## 2014-06-29 LAB — CBC
HEMATOCRIT: 26.1 % — AB (ref 39.0–52.0)
Hemoglobin: 8.4 g/dL — ABNORMAL LOW (ref 13.0–17.0)
MCH: 26.9 pg (ref 26.0–34.0)
MCHC: 32.2 g/dL (ref 30.0–36.0)
MCV: 83.7 fL (ref 78.0–100.0)
Platelets: 341 10*3/uL (ref 150–400)
RBC: 3.12 MIL/uL — ABNORMAL LOW (ref 4.22–5.81)
RDW: 13.8 % (ref 11.5–15.5)
WBC: 13.2 10*3/uL — ABNORMAL HIGH (ref 4.0–10.5)

## 2014-06-29 LAB — BASIC METABOLIC PANEL
Anion gap: 19 — ABNORMAL HIGH (ref 5–15)
BUN: 67 mg/dL — ABNORMAL HIGH (ref 6–23)
CALCIUM: 7.7 mg/dL — AB (ref 8.4–10.5)
CO2: 18 mEq/L — ABNORMAL LOW (ref 19–32)
Chloride: 101 mEq/L (ref 96–112)
Creatinine, Ser: 7.73 mg/dL — ABNORMAL HIGH (ref 0.50–1.35)
GFR, EST AFRICAN AMERICAN: 7 mL/min — AB (ref >90)
GFR, EST NON AFRICAN AMERICAN: 6 mL/min — AB (ref >90)
Glucose, Bld: 107 mg/dL — ABNORMAL HIGH (ref 70–99)
POTASSIUM: 4.9 meq/L (ref 3.7–5.3)
Sodium: 138 mEq/L (ref 137–147)

## 2014-06-29 LAB — GLUCOSE, CAPILLARY
GLUCOSE-CAPILLARY: 105 mg/dL — AB (ref 70–99)
GLUCOSE-CAPILLARY: 157 mg/dL — AB (ref 70–99)
Glucose-Capillary: 103 mg/dL — ABNORMAL HIGH (ref 70–99)
Glucose-Capillary: 112 mg/dL — ABNORMAL HIGH (ref 70–99)
Glucose-Capillary: 160 mg/dL — ABNORMAL HIGH (ref 70–99)
Glucose-Capillary: 99 mg/dL (ref 70–99)

## 2014-06-29 LAB — URINE CULTURE
COLONY COUNT: NO GROWTH
Culture: NO GROWTH

## 2014-06-29 LAB — HEPATITIS B SURFACE ANTIGEN: Hepatitis B Surface Ag: NEGATIVE

## 2014-06-29 LAB — PROCALCITONIN: Procalcitonin: 2.75 ng/mL

## 2014-06-29 LAB — HEPATITIS B SURFACE ANTIBODY,QUALITATIVE: HEP B S AB: NEGATIVE

## 2014-06-29 LAB — HEPATITIS B CORE ANTIBODY, IGM: Hep B C IgM: NONREACTIVE

## 2014-06-29 MED ORDER — CHLORHEXIDINE GLUCONATE 0.12 % MT SOLN
15.0000 mL | Freq: Two times a day (BID) | OROMUCOSAL | Status: DC
  Administered 2014-06-29 – 2014-07-02 (×7): 15 mL via OROMUCOSAL
  Filled 2014-06-29 (×7): qty 15

## 2014-06-29 MED ORDER — DARBEPOETIN ALFA-POLYSORBATE 60 MCG/0.3ML IJ SOLN
60.0000 ug | INTRAMUSCULAR | Status: DC
  Administered 2014-06-29 – 2014-07-06 (×2): 60 ug via INTRAVENOUS
  Filled 2014-06-29: qty 0.3

## 2014-06-29 MED ORDER — CETYLPYRIDINIUM CHLORIDE 0.05 % MT LIQD
7.0000 mL | Freq: Two times a day (BID) | OROMUCOSAL | Status: DC
  Administered 2014-06-29 – 2014-07-01 (×6): 7 mL via OROMUCOSAL

## 2014-06-29 MED ORDER — DARBEPOETIN ALFA-POLYSORBATE 60 MCG/0.3ML IJ SOLN
INTRAMUSCULAR | Status: AC
  Filled 2014-06-29: qty 0.3

## 2014-06-29 NOTE — Progress Notes (Signed)
Patient ID: Blake [name], male    DOB: August 19, 1946, 68 y.o.   MRN: [id number]  S: alert and responsive but difficulty speaking through bipap  O:BP 124/66  Pulse 83  Temp(Src) 98.9 F (37.2 C) (Axillary)  Resp 20  Ht 5' 8.9" (1.75 m)  Wt 224 lb 13.9 oz (102 kg)  BMI 33.31 kg/m2  SpO2 96%  Intake/Output Summary (Last 24 hours) at 06/29/14 1002 Last data filed at 06/29/14 0000  Gross per 24 hour  Intake      0 ml  Output   2675 ml  Net  -2675 ml   Intake/Output: I/O last 3 completed shifts: In: -  Out: 2675 [Urine:675; Other:2000]  Intake/Output this shift:    Weight change:   Gen:NAD, getting HD CV: RRR, normal s1s2, no m/r/g Resp:bipap, +mild rhonchi Abd: soft, +BS Ext: no edema/cyanosis, WWP.   Recent Labs Lab 06/28/14 0755 06/28/14 0818 06/28/14 2208 06/29/14 0500  NA 136* 137  --  138  K 5.6* 5.3  --  4.9  CL 102 112  --  101  CO2 10*  --   --  18*  GLUCOSE 140* 142*  --  107*  BUN 92* 94*  --  67*  CREATININE 9.37* 10.20*  --  7.73*  ALBUMIN 2.1*  --   --   --   CALCIUM 7.7*  --   --  7.7*  PHOS  --   --  5.9*  --   AST 20  --   --   --   ALT 13  --   --   --    Liver Function Tests:  Recent Labs Lab 06/28/14 0755  AST 20  ALT 13  ALKPHOS 117  BILITOT 0.3  PROT 7.7  ALBUMIN 2.1*   No results found for this basename: LIPASE, AMYLASE,  in the last 168 hours No results found for this basename: AMMONIA,  in the last 168 hours CBC:  Recent Labs Lab 06/28/14 0755 06/28/14 0818 06/29/14 0500  WBC 15.8*  --  13.2*  NEUTROABS 13.4*  --   --   HGB 9.3* 10.9* 8.4*  HCT 28.4* 32.0* 26.1*  MCV 82.6  --  83.7  PLT 394  --  341   Cardiac Enzymes:  Recent Labs Lab 06/28/14 0755 06/28/14 1000 06/28/14 2208  TROPONINI <0.30 <0.30 <0.30   CBG:  Recent Labs Lab 06/28/14 1808 06/28/14 1940 06/29/14 0007 06/29/14 0411 06/29/14 0852  GLUCAP 103* 91 103* 105* 99    Iron Studies: No results found for this basename: IRON, TIBC,  TRANSFERRIN, FERRITIN,  in the last 72 hours Studies/Results: Dg Chest Port 1 View  06/29/2014   CLINICAL DATA:  Respiratory distress.  Right airspace disease  EXAM: PORTABLE CHEST - 1 VIEW  COMPARISON:  06/29/1999 fifth  FINDINGS: Large-bore right central venous catheter with tip in the distal SVC is not changed. Stable enlarged cardiac silhouette. There is bilateral airspace disease greater in the right lung which is also unchanged. No pneumothorax. Low lung volumes.  IMPRESSION: No change in bilateral airspace disease suggesting asymmetric pulmonary edema. Cannot exclude infection.   Electronically Signed   By: Suzy Bouchard M.D.   On: 06/29/2014 07:59   Dg Chest Port 1 View  06/28/2014   CLINICAL DATA:  Placement of hemodialysis catheter.  EXAM: PORTABLE CHEST - 1 VIEW  COMPARISON:  06/28/2014.  FINDINGS: Right IJ dialysis catheter noted with tip projected superior vena cava. Persistent severe diffuse right pulmonary  infiltrate. Developing left pulmonary infiltrate cannot be excluded. Severe cardiomegaly. Small right pleural effusion cannot be excluded. No pneumothorax. No acute bony abnormality.  IMPRESSION: 1. Interval placement of right IJ dialysis catheter. Tip is in good anatomic position. 2. Severe diffuse right lung pulmonary infiltrate again noted. Left lower lobe mild infiltrate noted. These changes may be related to pneumonia. Pulmonary edema with asymmetric severe pulmonary edema on the right could also present in this fashion. 3. Severe cardiomegaly. 4. Small right pleural effusion appear   Electronically Signed   By: [place]   On: 06/28/2014 13:10   Dg Chest Port 1 View  06/28/2014   CLINICAL DATA:  Shortness of breath.  EXAM: PORTABLE CHEST - 1 VIEW  COMPARISON:  02/04/ 2014.  FINDINGS: Mediastinum hilar structures are normal. Diffuse infiltrate right lung with associated atelectatic changes. Mild infiltrate left lung base cannot be excluded. Small right pleural effusion. No  pneumothorax. Cardiomegaly. Normal pulmonary vascularity. No acute bony abnormality.  IMPRESSION: 1. Diffuse infiltrate right lung consistent with pneumonia. Associated atelectatic changes right lung. Small right pleural effusion. 2. Mild infiltrate left lung base cannot be excluded. 3. Cardiomegaly with normal pulmonary vascularity.   Electronically Signed   By: Marcello Moores  Register   On: 06/28/2014 07:56   . amLODipine  10 mg Oral Daily  . antiseptic oral rinse  7 mL Mouth Rinse q12n4p  . aspirin EC  81 mg Oral Daily  . atorvastatin  20 mg Oral q1800  . azithromycin  500 mg Intravenous Q24H  . buPROPion  300 mg Oral Daily  . calcitRIOL  0.25 mcg Oral Daily  . [START ON 07/01/2014]  ceFAZolin (ANCEF) IV  2 g Intravenous 60 min Pre-Op  . cefTRIAXone (ROCEPHIN)  IV  1 g Intravenous Q24H  . chlorhexidine  15 mL Mouth Rinse BID  . heparin  5,000 Units Subcutaneous 3 times per day  . insulin aspart  0-20 Units Subcutaneous 6 times per day  . pantoprazole (PROTONIX) IV  40 mg Intravenous QHS  . vancomycin  500 mg Intravenous Q Fri-HD    BMET    Component Value Date/Time   NA 138 06/29/2014 0500   K 4.9 06/29/2014 0500   CL 101 06/29/2014 0500   CO2 18* 06/29/2014 0500   GLUCOSE 107* 06/29/2014 0500   BUN 67* 06/29/2014 0500   CREATININE 7.73* 06/29/2014 0500   CALCIUM 7.7* 06/29/2014 0500   GFRNONAA 6* 06/29/2014 0500   GFRAA 7* 06/29/2014 0500   CBC    Component Value Date/Time   WBC 13.2* 06/29/2014 0500   RBC 3.12* 06/29/2014 0500   HGB 8.4* 06/29/2014 0500   HCT 26.1* 06/29/2014 0500   PLT 341 06/29/2014 0500   MCV 83.7 06/29/2014 0500   MCH 26.9 06/29/2014 0500   MCHC 32.2 06/29/2014 0500   RDW 13.8 06/29/2014 0500   LYMPHSABS 0.5* 06/28/2014 0755   MONOABS 1.5* 06/28/2014 0755   EOSABS 0.3 06/28/2014 0755   BASOSABS 0.0 06/28/2014 0755     Assessment/Plan:  1. Acute on CKD:  Urgent HD initiated 10/16, continue today.   2. Vascular access:  Temp trialysis catheter.  Planned 10/19 tunnel cath and RUE AVF 3. SHPTH: Phos 5.9, Ca 7.7, iPTH pending. 4. ACD 5. AG metabolic acidosis: improved, 2nd HD session today 6. Hyperkalemia: resolved with HD 7. DM: npo, cbg 90-110 8. HTN: amlodipine 9. CAP: abx per CCM  Tawanna Sat I have seen and examined this patient and agree with plan as outlined  by Dr. Lamar Benes, will need to start Aranesp with HD and cont to follow.  Respiratory status appears improved and will hold off on HD until Monday if ok with PCCM. Emonie Espericueta A,MD 06/29/2014 11:03 AM

## 2014-06-29 NOTE — Progress Notes (Signed)
PULMONARY / CRITICAL CARE MEDICINE   Name: Blake [name] MRN: [id number] DOB: 1945-10-09    ADMISSION DATE:  06/28/2014  REFERRING MD :  Dr. Thurnell Garbe   CHIEF COMPLAINT:  Respiratory Distress, Renal Failure   INITIAL PRESENTATION: 68 y/o M with PMH of CKD and recent work up for AVF placement who presented to [facility] ER on 10/16 with increased SOB, fatigue and hypoxemia.  Found to have a metabolic acidosis, significant worsening of renal failure and respiratory distress.  STUDIES:  10/16  CXR > R sided infiltrate vs edema   SIGNIFICANT EVENTS: 10/16  Admit to [facility] with Respiratory Failure +/- CAP, AKI   SUBJECTIVE:  Somnolent. NAD. + F/C. Intermittent NPPV  VITAL SIGNS: Temp:  [98.4 F (36.9 C)-99.5 F (37.5 C)] 98.7 F (37.1 C) (10/17 1616) Pulse Rate:  [71-92] 87 (10/17 1600) Resp:  [16-32] 22 (10/17 1600) BP: (111-151)/(54-139) 118/84 mmHg (10/17 1600) SpO2:  [84 %-100 %] 93 % (10/17 1600) FiO2 (%):  [45 %-60 %] 45 % (10/17 1350) Weight:  [97.8 kg (215 lb 9.8 oz)-103 kg (227 lb 1.2 oz)] 97.8 kg (215 lb 9.8 oz) (10/17 1042)  HEMODYNAMICS:    VENTILATOR SETTINGS: Vent Mode:  [-]  FiO2 (%):  [45 %-60 %] 45 %  INTAKE / OUTPUT:  Intake/Output Summary (Last 24 hours) at 06/29/14 1637 Last data filed at 06/29/14 1200  Gross per 24 hour  Intake     50 ml  Output   4580 ml  Net  -4530 ml    PHYSICAL EXAMINATION: General:  RASS -2 Neuro:  Somnolent. MAEs HEENT: WNL Cardiovascular:  RRR s M Lungs: scattered rhonchi Abdomen:  Soft, +BS  Ext: warm, no edema  LABS: I have reviewed all of today's lab results. Relevant abnormalities are discussed in the A/P section  CXR: R>L AS dz. Asymmetric edema vs PNA  ASSESSMENT / PLAN:  PULMONARY OETT A: Acute Hypoxemic Respiratory Failure Suspected pulm edema Possible CAP  P:   Cont supplemental O2 Cont PRN BiPAP  CARDIOVASCULAR CVL A:  Hypertension  HLD P:  Continue Norvasc, Lipitor, ASA ICU monitoring    RENAL A:   Acute on Chronic Kidney Disease Mild metabolic acidosis due to uremia P:   Monitor BMET intermittently Monitor I/Os Correct electrolytes as indicated Renal following - next HD planned for Monday  GASTROINTESTINAL A:   No issues P:   SUP: PPI (takes chronically) NPO until resp status improves  HEMATOLOGIC A:   Anemia due to CKD P:  DVT px: SQ heparin Monitor CBC intermittently Transfuse per usual ICU guidelines  INFECTIOUS A:   Elevated PCT Asymmetric infiltrates - possible PNA P:   BCx2 10/16 >>  UC  10/16 >>   U. Strep 10/16 >> NEG  Vanc 10/16 >>  Ceftrx 10/16 >>  Azithro 10/16 >>   PCT protocol   ENDOCRINE A:   DM 2 P:   SSI  Holding home NPH, R   NEUROLOGIC A:   Acute Encephalopathy - in the setting of uremia / AKI P:   RASS goal: 0 Minimize sedatives Delirium prevention measures   Family updated:     Interdisciplinary Family Meeting v Palliative Care Meeting:    TODAY'S SUMMARY:   I have personally obtained a history, examined the patient, evaluated laboratory and imaging results, formulated the assessment and plan and placed orders.   Merton Border, MD ; [facility] [phone].  After 5:30 PM or weekends, call [phone]   06/29/2014, 4:37 PM  [place] [facility] [phone] or if no answer [phone]

## 2014-06-29 NOTE — Progress Notes (Signed)
RT note: RT  transported patient to [facility] and back vital signs stable.

## 2014-06-29 NOTE — Progress Notes (Signed)
Pt. Was placed back on bipap due to desatting. Pt. Tolerating well at this time.

## 2014-06-29 NOTE — Procedures (Signed)
Patient was seen on dialysis and the procedure was supervised. BFR 250 Via RIJ Trialysis catheter BP is 124/73.  Patient appears to be tolerating treatment well

## 2014-06-30 ENCOUNTER — Inpatient Hospital Stay (HOSPITAL_COMMUNITY): Payer: Medicare Other

## 2014-06-30 DIAGNOSIS — N179 Acute kidney failure, unspecified: Principal | ICD-10-CM

## 2014-06-30 LAB — RENAL FUNCTION PANEL
Albumin: 2 g/dL — ABNORMAL LOW (ref 3.5–5.2)
Anion gap: 19 — ABNORMAL HIGH (ref 5–15)
BUN: 48 mg/dL — ABNORMAL HIGH (ref 6–23)
CHLORIDE: 96 meq/L (ref 96–112)
CO2: 22 mEq/L (ref 19–32)
Calcium: 7.9 mg/dL — ABNORMAL LOW (ref 8.4–10.5)
Creatinine, Ser: 6.55 mg/dL — ABNORMAL HIGH (ref 0.50–1.35)
GFR calc Af Amer: 9 mL/min — ABNORMAL LOW (ref >90)
GFR, EST NON AFRICAN AMERICAN: 8 mL/min — AB (ref >90)
Glucose, Bld: 105 mg/dL — ABNORMAL HIGH (ref 70–99)
Phosphorus: 7.4 mg/dL — ABNORMAL HIGH (ref 2.3–4.6)
Potassium: 4.2 mEq/L (ref 3.7–5.3)
SODIUM: 137 meq/L (ref 137–147)

## 2014-06-30 LAB — GLUCOSE, CAPILLARY
Glucose-Capillary: 108 mg/dL — ABNORMAL HIGH (ref 70–99)
Glucose-Capillary: 121 mg/dL — ABNORMAL HIGH (ref 70–99)
Glucose-Capillary: 128 mg/dL — ABNORMAL HIGH (ref 70–99)
Glucose-Capillary: 147 mg/dL — ABNORMAL HIGH (ref 70–99)
Glucose-Capillary: 184 mg/dL — ABNORMAL HIGH (ref 70–99)
Glucose-Capillary: 239 mg/dL — ABNORMAL HIGH (ref 70–99)
Glucose-Capillary: 252 mg/dL — ABNORMAL HIGH (ref 70–99)

## 2014-06-30 LAB — VANCOMYCIN, RANDOM: VANCOMYCIN RM: 15.7 ug/mL

## 2014-06-30 LAB — PROCALCITONIN: Procalcitonin: 4.16 ng/mL

## 2014-06-30 MED ORDER — INSULIN ASPART 100 UNIT/ML ~~LOC~~ SOLN
0.0000 [IU] | Freq: Three times a day (TID) | SUBCUTANEOUS | Status: DC
  Administered 2014-06-30 – 2014-07-02 (×3): 2 [IU] via SUBCUTANEOUS
  Administered 2014-07-02: 5 [IU] via SUBCUTANEOUS
  Administered 2014-07-03: 11 [IU] via SUBCUTANEOUS
  Administered 2014-07-03 (×2): 3 [IU] via SUBCUTANEOUS
  Administered 2014-07-04: 5 [IU] via SUBCUTANEOUS
  Administered 2014-07-04 – 2014-07-05 (×2): 3 [IU] via SUBCUTANEOUS
  Administered 2014-07-06: 5 [IU] via SUBCUTANEOUS
  Administered 2014-07-07: 8 [IU] via SUBCUTANEOUS
  Administered 2014-07-07: 5 [IU] via SUBCUTANEOUS
  Administered 2014-07-07: 11 [IU] via SUBCUTANEOUS
  Administered 2014-07-08: 3 [IU] via SUBCUTANEOUS
  Administered 2014-07-09: 5 [IU] via SUBCUTANEOUS
  Administered 2014-07-09: 3 [IU] via SUBCUTANEOUS

## 2014-06-30 MED ORDER — SODIUM CHLORIDE 0.9 % IV SOLN: INTRAVENOUS | Status: DC

## 2014-06-30 MED ORDER — CHLORHEXIDINE GLUCONATE CLOTH 2 % EX PADS
6.0000 | MEDICATED_PAD | Freq: Once | CUTANEOUS | Status: AC
  Administered 2014-07-01: 6 via TOPICAL

## 2014-06-30 MED ORDER — VANCOMYCIN HCL 500 MG IV SOLR
500.0000 mg | Freq: Once | INTRAVENOUS | Status: AC
  Administered 2014-06-30: 500 mg via INTRAVENOUS
  Filled 2014-06-30: qty 500

## 2014-06-30 MED ORDER — DEXTROSE 5 % IV SOLN: 1.5000 g | INTRAVENOUS | Status: DC

## 2014-06-30 MED ORDER — PANTOPRAZOLE SODIUM 40 MG PO TBEC
40.0000 mg | DELAYED_RELEASE_TABLET | Freq: Every day | ORAL | Status: DC
  Administered 2014-06-30 – 2014-07-09 (×10): 40 mg via ORAL
  Filled 2014-06-30 (×10): qty 1

## 2014-06-30 MED ORDER — ONDANSETRON HCL 4 MG/2ML IJ SOLN
4.0000 mg | Freq: Four times a day (QID) | INTRAMUSCULAR | Status: DC | PRN
  Administered 2014-06-30 (×2): 4 mg via INTRAVENOUS
  Filled 2014-06-30 (×2): qty 2

## 2014-06-30 MED ORDER — INSULIN GLARGINE 100 UNIT/ML ~~LOC~~ SOLN
20.0000 [IU] | Freq: Every day | SUBCUTANEOUS | Status: DC
  Administered 2014-06-30 – 2014-07-06 (×7): 20 [IU] via SUBCUTANEOUS
  Filled 2014-06-30 (×8): qty 0.2

## 2014-06-30 MED ORDER — INSULIN ASPART 100 UNIT/ML ~~LOC~~ SOLN
4.0000 [IU] | Freq: Three times a day (TID) | SUBCUTANEOUS | Status: DC
  Administered 2014-07-01 – 2014-07-03 (×4): 4 [IU] via SUBCUTANEOUS
  Administered 2014-07-03: 13:00:00 via SUBCUTANEOUS
  Administered 2014-07-04 – 2014-07-09 (×10): 4 [IU] via SUBCUTANEOUS

## 2014-06-30 MED ORDER — INSULIN ASPART 100 UNIT/ML ~~LOC~~ SOLN
0.0000 [IU] | Freq: Every day | SUBCUTANEOUS | Status: DC
  Administered 2014-06-30 – 2014-07-02 (×2): 3 [IU] via SUBCUTANEOUS
  Administered 2014-07-05: 2 [IU] via SUBCUTANEOUS
  Administered 2014-07-06: 3 [IU] via SUBCUTANEOUS
  Administered 2014-07-07: 2 [IU] via SUBCUTANEOUS

## 2014-06-30 MED ORDER — CHLORHEXIDINE GLUCONATE CLOTH 2 % EX PADS
6.0000 | MEDICATED_PAD | Freq: Once | CUTANEOUS | Status: AC
  Administered 2014-06-30: 6 via TOPICAL

## 2014-06-30 NOTE — Progress Notes (Signed)
PULMONARY / CRITICAL CARE MEDICINE   Name: Blake [name] MRN: [id number] DOB: [birth year]    ADMISSION DATE:  06/28/2014  REFERRING MD :  Dr. Thurnell Garbe   CHIEF COMPLAINT:  Respiratory Distress, Renal Failure   INITIAL PRESENTATION: 68 y/o M with PMH of CKD and recent work up for AVF placement who presented to [facility] ER on 10/16 with increased SOB, fatigue and hypoxemia.  Found to have a metabolic acidosis, significant worsening of renal failure and respiratory distress.  STUDIES:  10/16  CXR > R sided infiltrate vs edema   SIGNIFICANT EVENTS: 10/16  Admit to [facility] with Respiratory Failure +/- CAP, AKI 10/18 Trf to SDU ordered. TRH to assume care as of 10/19 AM. PCCM will see again as consultant  SUBJECTIVE:  Comfortable on VM. Desats readily when O2 removed. Used NPPV last PM  VITAL SIGNS: Temp:  [97.6 F (36.4 C)-98.9 F (37.2 C)] 98.3 F (36.8 C) (10/18 1612) Pulse Rate:  [75-99] 84 (10/18 1400) Resp:  [13-27] 19 (10/18 1400) BP: (109-145)/(57-96) 124/63 mmHg (10/18 1400) SpO2:  [85 %-100 %] 95 % (10/18 1400) Weight:  [99.2 kg (218 lb 11.1 oz)] 99.2 kg (218 lb 11.1 oz) (10/18 0500)  HEMODYNAMICS:    VENTILATOR SETTINGS:    INTAKE / OUTPUT:  Intake/Output Summary (Last 24 hours) at 06/30/14 1709 Last data filed at 06/30/14 0913  Gross per 24 hour  Intake    560 ml  Output      1 ml  Net    559 ml    PHYSICAL EXAMINATION: General:  RASS -2 Neuro:  Somnolent. MAEs HEENT: WNL Cardiovascular:  RRR s M Lungs: scattered rhonchi Abdomen:  Soft, +BS  Ext: warm, no edema  LABS: I have reviewed all of today's lab results. Relevant abnormalities are discussed in the A/P section  CXR: Diffuse IS edema pattern with area of focal consolidation in LLL  ASSESSMENT / PLAN:  PULMONARY A: Acute Hypoxemic Respiratory Failure Suspected pulm edema Possible CAP  P:   Cont supplemental O2 Cont PRN BiPAP Ok to transfer to SDU  CARDIOVASCULAR A:  Hypertension   HLD P:  Continue Norvasc, Lipitor, ASA Cont monitoring   RENAL A:   Acute on Chronic Kidney Disease Mild metabolic acidosis due to uremia P:   Monitor BMET intermittently Monitor I/Os Correct electrolytes as indicated Renal following - next HD planned for Monday For tunneled HD cath placement 10/19  GASTROINTESTINAL A:   Chronic PPI use P:   SUP: PPI  Advance diet  HEMATOLOGIC A:   Anemia due to CKD P:  DVT px: SQ heparin Monitor CBC intermittently Transfuse per usual ICU guidelines  INFECTIOUS A:   Elevated PCT Asymmetric infiltrates - possible PNA P:   BCx2 10/16 >>  UC  10/16 >>  NEG U. Strep 10/16 >> NEG  Vanc 10/16 >>  Ceftrx 10/16 >>  Azithro 10/16 >> 10/18  PCT protocol   ENDOCRINE A:   DM 2 P:   SSI  Holding home NPH, R   NEUROLOGIC A:   Acute Encephalopathy - in the setting of uremia / AKI P:   RASS goal: 0 Minimize sedatives Delirium prevention measures   Family updated:     Interdisciplinary Family Meeting v Palliative Care Meeting:    TODAY'S SUMMARY:  Transfer to SDU/TRH. Discussed with Dr Thereasa Solo. PCCM will see at least one more time as his resp status is somewhat tenuous  I have personally obtained a history, examined the patient, evaluated laboratory  and imaging results, formulated the assessment and plan and placed orders.   Merton Border, MD ; [facility] [phone].  After 5:30 PM or weekends, call [phone]   06/30/2014, 5:09 PM [place] [facility] [phone] or if no answer [phone]

## 2014-06-30 NOTE — Progress Notes (Signed)
ANTIBIOTIC CONSULT NOTE - FOLLOW UP  Pharmacy Consult for Vancomycin Indication: rule out pneumonia  No Known Allergies  Patient Measurements: Height: 5' 8.9" (175 cm) Weight: 218 lb 11.1 oz (99.2 kg) IBW/kg (Calculated) : 70.47  Vital Signs: Temp: 98.5 F (36.9 C) (10/18 0800) Temp Source: Axillary (10/18 0800) BP: 124/62 mmHg (10/18 0912) Pulse Rate: 80 (10/18 0900) Intake/Output from previous day: 10/17 0701 - 10/18 0700 In: 500 [P.O.:200; IV Piggyback:300] Out: 2005  Intake/Output from this shift: Total I/O In: 360 [P.O.:360] Out: 1 [Stool:1]  Labs:  Recent Labs  06/28/14 0755 06/28/14 0818 06/29/14 0500 06/30/14 0250  WBC 15.8*  --  13.2*  --   HGB 9.3* 10.9* 8.4*  --   PLT 394  --  341  --   CREATININE 9.37* 10.20* 7.73* 6.55*   Estimated Creatinine Clearance: 12.9 ml/min (by C-G formula based on Cr of 6.55).  Recent Labs  06/30/14 0250  VANCORANDOM 15.7     Microbiology: Recent Results (from the past 720 hour(s))  CULTURE, BLOOD (ROUTINE X 2)     Status: None   Collection Time    06/28/14  9:45 AM      Result Value Ref Range Status   Specimen Description BLOOD LEFT HAND   Final   Special Requests BOTTLES DRAWN AEROBIC AND ANAEROBIC 10ML   Final   Culture  Setup Time     Final   Value: 06/28/2014 17:15     Performed at [facility]   Culture     Final   Value:        BLOOD CULTURE RECEIVED NO GROWTH TO DATE CULTURE WILL BE HELD FOR 5 DAYS BEFORE ISSUING A FINAL NEGATIVE REPORT     Performed at [facility]   Report Status PENDING   Incomplete  CULTURE, BLOOD (ROUTINE X 2)     Status: None   Collection Time    06/28/14 10:00 AM      Result Value Ref Range Status   Specimen Description BLOOD LEFT ARM   Final   Special Requests BOTTLES DRAWN AEROBIC AND ANAEROBIC 10ML   Final   Culture  Setup Time     Final   Value: 06/28/2014 17:14     Performed at [facility]   Culture     Final   Value:        BLOOD CULTURE  RECEIVED NO GROWTH TO DATE CULTURE WILL BE HELD FOR 5 DAYS BEFORE ISSUING A FINAL NEGATIVE REPORT     Performed at [facility]   Report Status PENDING   Incomplete  MRSA PCR SCREENING     Status: None   Collection Time    06/28/14  1:57 PM      Result Value Ref Range Status   MRSA by PCR NEGATIVE  NEGATIVE Final   Comment:            The GeneXpert MRSA Assay (FDA     approved for NASAL specimens     only), is one component of a     comprehensive MRSA colonization     surveillance program. It is not     intended to diagnose MRSA     infection nor to guide or     monitor treatment for     MRSA infections.  URINE CULTURE     Status: None   Collection Time    06/28/14  2:17 PM      Result Value Ref Range  Status   Specimen Description URINE, CLEAN CATCH   Final   Special Requests NONE   Final   Culture  Setup Time     Final   Value: 06/28/2014 21:00     Performed at [facility] Count     Final   Value: NO GROWTH     Performed at [facility]   Culture     Final   Value: NO GROWTH     Performed at [facility]   Report Status 06/29/2014 FINAL   Final    Anti-infectives   Start     Dose/Rate Route Frequency Ordered Stop   07/01/14 0600  ceFAZolin (ANCEF) IVPB 1 g/50 mL premix  Status:  Discontinued     1 g 100 mL/hr over 30 Minutes Intravenous On call to O.R. 06/28/14 1303 06/28/14 1323   07/01/14 0600  ceFAZolin (ANCEF) IVPB 2 g/50 mL premix  Status:  Discontinued     2 g 100 mL/hr over 30 Minutes Intravenous 60 min pre-op 06/28/14 1324 06/30/14 1121   07/01/14 0600  cefUROXime (ZINACEF) 1.5 g in dextrose 5 % 50 mL IVPB     1.5 g 100 mL/hr over 30 Minutes Intravenous 30 min pre-op 06/30/14 1122     06/30/14 1130  vancomycin (VANCOCIN) 500 mg in sodium chloride 0.9 % 100 mL IVPB     500 mg 100 mL/hr over 60 Minutes Intravenous  Once 06/30/14 1120     06/29/14 1000  azithromycin (ZITHROMAX) 500 mg in dextrose 5 % 250 mL IVPB      500 mg 250 mL/hr over 60 Minutes Intravenous Every 24 hours 06/28/14 1137     06/29/14 1000  cefTRIAXone (ROCEPHIN) 1 g in dextrose 5 % 50 mL IVPB     1 g 100 mL/hr over 30 Minutes Intravenous Every 24 hours 06/28/14 1137     06/28/14 1330  vancomycin (VANCOCIN) 500 mg in sodium chloride 0.9 % 100 mL IVPB  Status:  Discontinued     500 mg 100 mL/hr over 60 Minutes Intravenous Every Fri (Hemodialysis) 06/28/14 1320 06/29/14 1410   06/28/14 0930  vancomycin (VANCOCIN) 2,000 mg in sodium chloride 0.9 % 500 mL IVPB     2,000 mg 250 mL/hr over 120 Minutes Intravenous  Once 06/28/14 0846 06/28/14 1258   06/28/14 0815  cefTRIAXone (ROCEPHIN) 1 g in dextrose 5 % 50 mL IVPB     1 g 100 mL/hr over 30 Minutes Intravenous  Once 06/28/14 0814 06/28/14 0859   06/28/14 0815  azithromycin (ZITHROMAX) 500 mg in dextrose 5 % 250 mL IVPB     500 mg 250 mL/hr over 60 Minutes Intravenous  Once 06/28/14 0814 06/28/14 1003      Assessment: 67 YOM who continues on Vancomycin per Rx + Rocephin + Azithromycin for r/o PNA. A random Vancomycin level this morning was therapeutic however on the lower end of the range (VR 15.7 mcg/ml, goal of 15-25 mcg/ml while on HD). Will give a small booster dose this AM - per renal planning for next HD session on 10/19.   Goal of Therapy:  Pre-HD Vancomycin level of 15-25 mcg/ml  Plan:  1. Vancomycin 500 mg IV x 1 dose today 2. Will continue to follow HD schedule/duration, culture results, LOT, and antibiotic de-escalation plans   Alycia Rossetti, PharmD, BCPS Clinical Pharmacist Pager: [phone] 06/30/2014 11:33 AM

## 2014-06-30 NOTE — Consult Note (Signed)
Patient ID: Blake [name], male    DOB: 1946/06/06, 68 y.o.   MRN: [id number]  S: alert and responsive but difficulty speaking through ventimask. Endorsing SOB unchanged.  O:BP 124/62  Pulse 80  Temp(Src) 98.5 F (36.9 C) (Axillary)  Resp 21  Ht 5' 8.9" (1.75 m)  Wt 218 lb 11.1 oz (99.2 kg)  BMI 32.39 kg/m2  SpO2 95%  Intake/Output Summary (Last 24 hours) at 06/30/14 1032 Last data filed at 06/30/14 0913  Gross per 24 hour  Intake    860 ml  Output   2006 ml  Net  -1146 ml   Intake/Output: I/O last 3 completed shifts: In: 500 [P.O.:200; IV Piggyback:300] Out: 2580 [Urine:575; Other:2005]  Intake/Output this shift:  Total I/O In: 360 [P.O.:360] Out: 1 [Stool:1] Weight change: -24 lb 0.5 oz (-10.9 kg)  Gen: NAD, lying in bed CV: RRR, normal s1s2, no m/r/g Resp: ventimask, +mild rhonchi, mildly increased WOB Abd: soft, +BS Ext: no edema/cyanosis, WWP.   Recent Labs Lab 06/28/14 0755 06/28/14 0818 06/28/14 2208 06/29/14 0500 06/30/14 0250  NA 136* 137  --  138 137  K 5.6* 5.3  --  4.9 4.2  CL 102 112  --  101 96  CO2 10*  --   --  18* 22  GLUCOSE 140* 142*  --  107* 105*  BUN 92* 94*  --  67* 48*  CREATININE 9.37* 10.20*  --  7.73* 6.55*  ALBUMIN 2.1*  --   --   --  2.0*  CALCIUM 7.7*  --   --  7.7* 7.9*  PHOS  --   --  5.9*  --  7.4*  AST 20  --   --   --   --   ALT 13  --   --   --   --    Liver Function Tests:  Recent Labs Lab 06/28/14 0755 06/30/14 0250  AST 20  --   ALT 13  --   ALKPHOS 117  --   BILITOT 0.3  --   PROT 7.7  --   ALBUMIN 2.1* 2.0*   No results found for this basename: LIPASE, AMYLASE,  in the last 168 hours No results found for this basename: AMMONIA,  in the last 168 hours CBC:  Recent Labs Lab 06/28/14 0755 06/28/14 0818 06/29/14 0500  WBC 15.8*  --  13.2*  NEUTROABS 13.4*  --   --   HGB 9.3* 10.9* 8.4*  HCT 28.4* 32.0* 26.1*  MCV 82.6  --  83.7  PLT 394  --  341   Cardiac Enzymes:  Recent Labs Lab  06/28/14 0755 06/28/14 1000 06/28/14 2208  TROPONINI <0.30 <0.30 <0.30   CBG:  Recent Labs Lab 06/29/14 1612 06/29/14 2029 06/30/14 0013 06/30/14 0407 06/30/14 0746  GLUCAP 160* 157* 128* 121* 108*    Iron Studies: No results found for this basename: IRON, TIBC, TRANSFERRIN, FERRITIN,  in the last 72 hours Studies/Results: Dg Chest Port 1 View  06/30/2014   CLINICAL DATA:  Respiratory failure.  EXAM: PORTABLE CHEST - 1 VIEW  COMPARISON:  06/29/2014  FINDINGS: Stable position of the right jugular central venous catheter. Catheter tip in the upper SVC region. Again noted are bilateral airspace and interstitial markings, particularly in the right upper lung. The lung opacities have not significantly changed. Heart size is stable. Negative for a pneumothorax.  IMPRESSION: Stable interstitial and airspace densities. Findings are most compatible with pulmonary edema.  Stable position of central  line.   Electronically Signed   By: Markus Daft M.D.   On: 06/30/2014 07:48   Dg Chest Port 1 View  06/29/2014   CLINICAL DATA:  Respiratory distress.  Right airspace disease  EXAM: PORTABLE CHEST - 1 VIEW  COMPARISON:  06/29/1999 fifth  FINDINGS: Large-bore right central venous catheter with tip in the distal SVC is not changed. Stable enlarged cardiac silhouette. There is bilateral airspace disease greater in the right lung which is also unchanged. No pneumothorax. Low lung volumes.  IMPRESSION: No change in bilateral airspace disease suggesting asymmetric pulmonary edema. Cannot exclude infection.   Electronically Signed   By: Suzy Bouchard M.D.   On: 06/29/2014 07:59   Dg Chest Port 1 View  06/28/2014   CLINICAL DATA:  Placement of hemodialysis catheter.  EXAM: PORTABLE CHEST - 1 VIEW  COMPARISON:  06/28/2014.  FINDINGS: Right IJ dialysis catheter noted with tip projected superior vena cava. Persistent severe diffuse right pulmonary infiltrate. Developing left pulmonary infiltrate cannot be  excluded. Severe cardiomegaly. Small right pleural effusion cannot be excluded. No pneumothorax. No acute bony abnormality.  IMPRESSION: 1. Interval placement of right IJ dialysis catheter. Tip is in good anatomic position. 2. Severe diffuse right lung pulmonary infiltrate again noted. Left lower lobe mild infiltrate noted. These changes may be related to pneumonia. Pulmonary edema with asymmetric severe pulmonary edema on the right could also present in this fashion. 3. Severe cardiomegaly. 4. Small right pleural effusion appear   Electronically Signed   By: [place]   On: 06/28/2014 13:10   . amLODipine  10 mg Oral Daily  . antiseptic oral rinse  7 mL Mouth Rinse q12n4p  . aspirin EC  81 mg Oral Daily  . atorvastatin  20 mg Oral q1800  . azithromycin  500 mg Intravenous Q24H  . buPROPion  300 mg Oral Daily  . calcitRIOL  0.25 mcg Oral Daily  . [START ON 07/01/2014]  ceFAZolin (ANCEF) IV  2 g Intravenous 60 min Pre-Op  . cefTRIAXone (ROCEPHIN)  IV  1 g Intravenous Q24H  . chlorhexidine  15 mL Mouth Rinse BID  . darbepoetin (ARANESP) injection - DIALYSIS  60 mcg Intravenous Q Sat-HD  . heparin  5,000 Units Subcutaneous 3 times per day  . insulin aspart  0-20 Units Subcutaneous 6 times per day  . pantoprazole (PROTONIX) IV  40 mg Intravenous QHS    Assessment/Plan:  1. 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 Respiratory status still tenuous 1. Plan for HD tomorrow with continued UF 2. Vascular access:  Temp trialysis catheter. Planned 10/19 tunnel cath and RUE AVF 3. Pulmonary- still requiring high flow oxygen but off of bipap.  Cont with UF and treatment of PNA. 4. SHPTH: Phos 7.4, Ca 7.9, iPTH pending. 1. On calcitriol and will need to start phosphate binders once taking po 5. ACD, weekly aranesp 6. AG metabolic acidosis: resolved, 3rd HD session today 7. Hyperkalemia: resolved with HD 8. DM: npo, cbg 105-157, SSI 9. HTN: amlodipine 10. CAP: abx per CCM (vanc,  ctx, azithro)  Beverlyn Roux  I have seen and examined this patient and agree with plan as outlined by Dr. Sherril Cong with the above additions/corrections. Chryl Holten A,MD 06/30/2014 1:39 PM

## 2014-07-01 ENCOUNTER — Encounter (HOSPITAL_COMMUNITY)
Admission: EM | Disposition: A | Discharge status: Skilled Nursing Facility | Payer: Self-pay | Source: Home / Self Care | Attending: Internal Medicine

## 2014-07-01 ENCOUNTER — Inpatient Hospital Stay (HOSPITAL_COMMUNITY): Payer: Medicare Other

## 2014-07-01 ENCOUNTER — Ambulatory Visit (HOSPITAL_COMMUNITY): Admission: RE | Admit: 2014-07-01 | Payer: Medicare Other | Source: Ambulatory Visit | Admitting: Vascular Surgery

## 2014-07-01 LAB — GLUCOSE, CAPILLARY
GLUCOSE-CAPILLARY: 134 mg/dL — AB (ref 70–99)
Glucose-Capillary: 127 mg/dL — ABNORMAL HIGH (ref 70–99)
Glucose-Capillary: 75 mg/dL (ref 70–99)
Glucose-Capillary: 94 mg/dL (ref 70–99)

## 2014-07-01 LAB — PARATHYROID HORMONE, INTACT (NO CA): PTH: 535 pg/mL — ABNORMAL HIGH (ref 14–64)

## 2014-07-01 LAB — BASIC METABOLIC PANEL
Anion gap: 21 — ABNORMAL HIGH (ref 5–15)
BUN: 65 mg/dL — ABNORMAL HIGH (ref 6–23)
CALCIUM: 7.8 mg/dL — AB (ref 8.4–10.5)
CHLORIDE: 95 meq/L — AB (ref 96–112)
CO2: 21 mEq/L (ref 19–32)
Creatinine, Ser: 8.9 mg/dL — ABNORMAL HIGH (ref 0.50–1.35)
GFR calc Af Amer: 6 mL/min — ABNORMAL LOW (ref >90)
GFR calc non Af Amer: 5 mL/min — ABNORMAL LOW (ref >90)
GLUCOSE: 162 mg/dL — AB (ref 70–99)
Potassium: 4.9 mEq/L (ref 3.7–5.3)
Sodium: 137 mEq/L (ref 137–147)

## 2014-07-01 LAB — CBC
HEMATOCRIT: 27 % — AB (ref 39.0–52.0)
HEMOGLOBIN: 8.7 g/dL — AB (ref 13.0–17.0)
MCH: 27 pg (ref 26.0–34.0)
MCHC: 32.2 g/dL (ref 30.0–36.0)
MCV: 83.9 fL (ref 78.0–100.0)
Platelets: 312 10*3/uL (ref 150–400)
RBC: 3.22 MIL/uL — ABNORMAL LOW (ref 4.22–5.81)
RDW: 13.6 % (ref 11.5–15.5)
WBC: 13.1 10*3/uL — AB (ref 4.0–10.5)

## 2014-07-01 SURGERY — ARTERIOVENOUS (AV) FISTULA CREATION
Anesthesia: Monitor Anesthesia Care | Laterality: Right

## 2014-07-01 MED ORDER — HEPARIN SODIUM (PORCINE) 1000 UNIT/ML IJ SOLN
INTRAMUSCULAR | Status: AC
  Filled 2014-07-01: qty 1

## 2014-07-01 MED ORDER — PROMETHAZINE HCL 25 MG/ML IJ SOLN
12.5000 mg | Freq: Four times a day (QID) | INTRAMUSCULAR | Status: DC | PRN
  Administered 2014-07-01 – 2014-07-08 (×3): 12.5 mg via INTRAVENOUS
  Filled 2014-07-01 (×3): qty 1

## 2014-07-01 MED ORDER — NEPRO/CARBSTEADY PO LIQD
237.0000 mL | Freq: Two times a day (BID) | ORAL | Status: DC
  Administered 2014-07-02 – 2014-07-09 (×9): 237 mL via ORAL
  Filled 2014-07-01 (×7): qty 237

## 2014-07-01 MED ORDER — VANCOMYCIN HCL IN DEXTROSE 1-5 GM/200ML-% IV SOLN
1000.0000 mg | Freq: Once | INTRAVENOUS | Status: AC
  Administered 2014-07-01: 1000 mg via INTRAVENOUS
  Filled 2014-07-01: qty 200

## 2014-07-01 MED ORDER — METOCLOPRAMIDE HCL 5 MG/ML IJ SOLN
5.0000 mg | Freq: Four times a day (QID) | INTRAMUSCULAR | Status: DC
  Administered 2014-07-01: 14:00:00 via INTRAVENOUS
  Administered 2014-07-01 – 2014-07-03 (×8): 5 mg via INTRAVENOUS
  Administered 2014-07-03: 13:00:00 via INTRAVENOUS
  Administered 2014-07-04 – 2014-07-05 (×5): 5 mg via INTRAVENOUS
  Filled 2014-07-01 (×16): qty 1
  Filled 2014-07-01 (×2): qty 2
  Filled 2014-07-01 (×4): qty 1

## 2014-07-01 SURGICAL SUPPLY — 56 items
ARMBAND PINK RESTRICT EXTREMIT (MISCELLANEOUS) ×4 IMPLANT
BAG DECANTER FOR FLEXI CONT (MISCELLANEOUS) ×4 IMPLANT
BLADE SURG 10 STRL SS (BLADE) ×4 IMPLANT
CANISTER SUCTION 2500CC (MISCELLANEOUS) ×4 IMPLANT
CATH CANNON HEMO 15F 50CM (CATHETERS) IMPLANT
CATH CANNON HEMO 15FR 19 (HEMODIALYSIS SUPPLIES) IMPLANT
CATH CANNON HEMO 15FR 23CM (HEMODIALYSIS SUPPLIES) IMPLANT
CATH CANNON HEMO 15FR 31CM (HEMODIALYSIS SUPPLIES) IMPLANT
CATH CANNON HEMO 15FR 32CM (HEMODIALYSIS SUPPLIES) IMPLANT
CATH STRAIGHT 5FR 65CM (CATHETERS) IMPLANT
CLIP TI MEDIUM 6 (CLIP) ×4 IMPLANT
CLIP TI WIDE RED SMALL 6 (CLIP) ×4 IMPLANT
COVER PROBE W GEL 5X96 (DRAPES) ×4 IMPLANT
COVER SURGICAL LIGHT HANDLE (MISCELLANEOUS) ×4 IMPLANT
DECANTER SPIKE VIAL GLASS SM (MISCELLANEOUS) ×4 IMPLANT
DERMABOND ADVANCED (GAUZE/BANDAGES/DRESSINGS) ×2
DERMABOND ADVANCED .7 DNX12 (GAUZE/BANDAGES/DRESSINGS) ×2 IMPLANT
DRAPE C-ARM 42X72 X-RAY (DRAPES) ×4 IMPLANT
DRAPE CHEST BREAST 15X10 FENES (DRAPES) ×4 IMPLANT
ELECT REM PT RETURN 9FT ADLT (ELECTROSURGICAL) ×4
ELECTRODE REM PT RTRN 9FT ADLT (ELECTROSURGICAL) ×2 IMPLANT
GAUZE SPONGE 2X2 8PLY STRL LF (GAUZE/BANDAGES/DRESSINGS) ×2 IMPLANT
GAUZE SPONGE 4X4 16PLY XRAY LF (GAUZE/BANDAGES/DRESSINGS) ×4 IMPLANT
GLOVE BIO SURGEON STRL SZ7 (GLOVE) ×4 IMPLANT
GLOVE BIOGEL PI IND STRL 7.5 (GLOVE) ×2 IMPLANT
GLOVE BIOGEL PI INDICATOR 7.5 (GLOVE) ×2
GOWN STRL REUS W/ TWL LRG LVL3 (GOWN DISPOSABLE) ×6 IMPLANT
GOWN STRL REUS W/TWL LRG LVL3 (GOWN DISPOSABLE) ×6
KIT BASIN OR (CUSTOM PROCEDURE TRAY) ×4 IMPLANT
KIT ROOM TURNOVER OR (KITS) ×4 IMPLANT
NEEDLE 18GX1X1/2 (RX/OR ONLY) (NEEDLE) ×4 IMPLANT
NEEDLE HYPO 25GX1X1/2 BEV (NEEDLE) ×4 IMPLANT
NS IRRIG 1000ML POUR BTL (IV SOLUTION) ×4 IMPLANT
PACK CV ACCESS (CUSTOM PROCEDURE TRAY) ×4 IMPLANT
PACK SURGICAL SETUP 50X90 (CUSTOM PROCEDURE TRAY) ×4 IMPLANT
PAD ARMBOARD 7.5X6 YLW CONV (MISCELLANEOUS) ×8 IMPLANT
SET MICROPUNCTURE 5F STIFF (MISCELLANEOUS) IMPLANT
SOAP 2 % CHG 4 OZ (WOUND CARE) ×4 IMPLANT
SPONGE GAUZE 2X2 STER 10/PKG (GAUZE/BANDAGES/DRESSINGS) ×2
SPONGE SURGIFOAM ABS GEL 100 (HEMOSTASIS) IMPLANT
SUT ETHILON 3 0 PS 1 (SUTURE) ×4 IMPLANT
SUT MNCRL AB 4-0 PS2 18 (SUTURE) ×4 IMPLANT
SUT PROLENE 6 0 BV (SUTURE) IMPLANT
SUT PROLENE 7 0 BV 1 (SUTURE) ×4 IMPLANT
SUT VIC AB 3-0 SH 27 (SUTURE) ×2
SUT VIC AB 3-0 SH 27X BRD (SUTURE) ×2 IMPLANT
SYR 20CC LL (SYRINGE) ×8 IMPLANT
SYR 3ML LL SCALE MARK (SYRINGE) ×4 IMPLANT
SYR 5ML LL (SYRINGE) ×4 IMPLANT
SYR CONTROL 10ML LL (SYRINGE) ×4 IMPLANT
SYRINGE 10CC LL (SYRINGE) ×4 IMPLANT
TOWEL OR 17X24 6PK STRL BLUE (TOWEL DISPOSABLE) ×4 IMPLANT
TOWEL OR 17X26 10 PK STRL BLUE (TOWEL DISPOSABLE) ×4 IMPLANT
UNDERPAD 30X30 INCONTINENT (UNDERPADS AND DIAPERS) ×4 IMPLANT
WATER STERILE IRR 1000ML POUR (IV SOLUTION) ×4 IMPLANT
WIRE AMPLATZ SS-J .035X180CM (WIRE) IMPLANT

## 2014-07-01 NOTE — Progress Notes (Signed)
Patient is having nausea that has not been relieved by Zofran, IV Phenergan was ordered and administered. Will hold all PO meds until patient's nausea is relieved and patient finishes dialysis (patient is being dialyzed now). Will monitor

## 2014-07-01 NOTE — Progress Notes (Signed)
INITIAL NUTRITION ASSESSMENT  DOCUMENTATION CODES Per approved criteria  -Obesity Unspecified   INTERVENTION:  Nepro Shake PO BID, each supplement provides 425 kcal and 19 grams protein  NUTRITION DIAGNOSIS: Inadequate oral intake related to inability to eat as evidenced by NPO status.   Goal: Intake to meet >90% of estimated nutrition needs.  Monitor:  Diet advancement, PO intake, labs, weight trend.  Reason for Assessment: MST  68 y.o. male  Admitting Dx: Respiratory distress, renal failure  ASSESSMENT: 68 y/o M with PMH of CKD and recent work up for AVF placement who presented to [facility] ER on 10/16 with increased SOB, fatigue and hypoxemia. Found to have a metabolic acidosis, significant worsening of renal failure and respiratory distress.  Nutrition focused physical exam completed.  No muscle or subcutaneous fat depletion noticed. Patient with multiple areas of loose skin and stretch marks, suspect related to weight loss.  Patient's RN reports that he was eating yesterday, but has not eaten today due to respiratory distress. Currently on BiPAP.  Height: Ht Readings from Last 1 Encounters:  06/28/14 5' 8.9" (1.75 m)    Weight: Wt Readings from Last 1 Encounters:  07/01/14 215 lb 9.8 oz (97.8 kg)    Ideal Body Weight: 72.7 kg  % Ideal Body Weight: 135%  Wt Readings from Last 10 Encounters:  07/01/14 215 lb 9.8 oz (97.8 kg)  07/01/14 215 lb 9.8 oz (97.8 kg)  06/27/14 249 lb (112.946 kg)  11/29/12 252 lb (114.306 kg)  10/20/12 271 lb 8 oz (123.152 kg)    Usual Body Weight: 252 lb (2.5 years ago)  % Usual Body Weight: 85%  BMI:  Body mass index is 31.93 kg/(m^2). class 1 obesity  Estimated Nutritional Needs: Kcal: 2200-2400 Protein: 100-130 gm Fluid: 1.2 L  Skin: WDL  Diet Order: Diabetic (Renal, CHO-modified)  EDUCATION NEEDS: -Education not appropriate at this time   Intake/Output Summary (Last 24 hours) at 07/01/14 1355 Last data filed at  07/01/14 1142  Gross per 24 hour  Intake    130 ml  Output   2100 ml  Net  -1970 ml    Last BM: 10/18   Labs:   Recent Labs Lab 06/28/14 0818 06/28/14 2208 06/29/14 0500 06/30/14 0250 07/01/14 0444  NA 137  --  138 137 137  K 5.3  --  4.9 4.2 4.9  CL 112  --  101 96 95*  CO2  --   --  18* 22 21  BUN 94*  --  67* 48* 65*  CREATININE 10.20*  --  7.73* 6.55* 8.90*  CALCIUM  --   --  7.7* 7.9* 7.8*  PHOS  --  5.9*  --  7.4*  --   GLUCOSE 142*  --  107* 105* 162*    CBG (last 3)   Recent Labs  06/30/14 2254 07/01/14 0835 07/01/14 1207  GLUCAP 252* 127* 75    Scheduled Meds: . amLODipine  10 mg Oral Daily  . antiseptic oral rinse  7 mL Mouth Rinse q12n4p  . aspirin EC  81 mg Oral Daily  . atorvastatin  20 mg Oral q1800  . buPROPion  300 mg Oral Daily  . calcitRIOL  0.25 mcg Oral Daily  . cefTRIAXone (ROCEPHIN)  IV  1 g Intravenous Q24H  . chlorhexidine  15 mL Mouth Rinse BID  . darbepoetin (ARANESP) injection - DIALYSIS  60 mcg Intravenous Q Sat-HD  . heparin  5,000 Units Subcutaneous 3 times per day  .  insulin aspart  0-15 Units Subcutaneous TID WC  . insulin aspart  0-5 Units Subcutaneous QHS  . insulin aspart  4 Units Subcutaneous TID WC  . insulin glargine  20 Units Subcutaneous QHS  . metoCLOPramide (REGLAN) injection  5 mg Intravenous 4 times per day  . pantoprazole  40 mg Oral Daily    Continuous Infusions: . sodium chloride      Past Medical History  Diagnosis Date  . Diabetes mellitus without complication   . Hypertension   . Hyperlipidemia   . Renal disorder     Past Surgical History  Procedure Laterality Date  . Back surgery       Molli Barrows, RD, LDN, [place] Pager [phone] After Hours Pager [phone]

## 2014-07-01 NOTE — Progress Notes (Deleted)
Patient is back in PAF, MD rounding now, orders given to give AMIO bolus and PO doses started for this afternoon, will monitor, all the vitals stable

## 2014-07-01 NOTE — Progress Notes (Signed)
Patient [id number] BRIAR STPIERRE      DOB: Jan 27, 1946      [id number]  Attempted to arrange to work on goals of care with patient.  At this time patient back on bipap. He is rousable but not conversant.  He shook his head no when I asked if we could include someone in our conversation that could help him advocate for his wishes.  He shook head no and fell back off to sleep. Did agree to let me come by in early am to talk more. Will plan to try to regoal after 830 some time in the am.    Stetson Pelaez L. Lovena Le, MD MBA The Palliative Medicine Team at [facility] Phone: [phone] Pager: [phone] ( Use team phone after hours)

## 2014-07-01 NOTE — Progress Notes (Signed)
Pt off bipap at this time. Placed on 14L/55% VM

## 2014-07-01 NOTE — Progress Notes (Signed)
PULMONARY / CRITICAL CARE MEDICINE   Name: Blake [name] MRN: [id number] DOB: 08/22/1946    ADMISSION DATE:  06/28/2014  REFERRING MD :  Dr. Thurnell Garbe   CHIEF COMPLAINT:  Respiratory Distress, Renal Failure   INITIAL PRESENTATION: 67 y/o M with PMH of CKD and recent work up for AVF placement who presented to [facility] ER on 10/16 with increased SOB, fatigue and hypoxemia.  Found to have a metabolic acidosis, significant worsening of renal failure and respiratory distress.  STUDIES:  10/16  CXR > R sided infiltrate vs edema   SIGNIFICANT EVENTS: 10/16  Admit to [facility] with Respiratory Failure +/- CAP, AKI 10/18 Trf to SDU ordered. TRH to assume care as of 10/19 AM. PCCM will see again as consultant 10/19 Persistent nausea. Remains on 55% VM, with biPAP prn.   SUBJECTIVE:  Persistent nauseam despite zofran and phenergan. Reports breathing comfortably on VM at 55%. Desats readily when O2 removed. Used NPPV last PM.   VITAL SIGNS: Temp:  [97.3 F (36.3 C)-98.3 F (36.8 C)] 97.6 F (36.4 C) (10/19 0741) Pulse Rate:  [74-100] 84 (10/19 1115) Resp:  [12-26] 19 (10/19 1115) BP: (91-142)/(41-87) 108/79 mmHg (10/19 1115) SpO2:  [78 %-100 %] 91 % (10/19 0823) FiO2 (%):  [55 %] 55 % (10/19 0823) Weight:  [99.6 kg (219 lb 9.3 oz)-101 kg (222 lb 10.6 oz)] 99.6 kg (219 lb 9.3 oz) (10/19 0741)  HEMODYNAMICS:    VENTILATOR SETTINGS: Vent Mode:  [-]  FiO2 (%):  [55 %] 55 %  INTAKE / OUTPUT:  Intake/Output Summary (Last 24 hours) at 07/01/14 1123 Last data filed at 07/01/14 0000  Gross per 24 hour  Intake    130 ml  Output    300 ml  Net   -170 ml    PHYSICAL EXAMINATION: General:  Alert.  Neuro:  Follows commands. MAEs. No focal deficits.  HEENT: WNL Cardiovascular:  RRR  Lungs: scattered rhonchi; no wheeze Abdomen:  Soft, nontender. +BS.  Ext: warm, no edema  LABS: I have reviewed all of today's lab results. 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 HLD P:  Continue Norvasc, Lipitor, ASA Cont monitoring   RENAL A:   Acute on Chronic Kidney Disease Mild metabolic acidosis due to uremia P:   Monitor BMET intermittently Monitor I/Os Correct electrolytes as indicated Renal following - HD 10/19 Renal team will reassess tunneled HD cath placement later today due to pulm status  GASTROINTESTINAL A:   Chronic PPI use Nausea- likely gastroparesis No abdominal pain P:   SUP: PPI  Advance diet as tolerated; 1270ml fluid restriction KUB Reglan 5mg  q 6 scheduled Phenergan 12.5 q 6 hours prn Zofran 4mg  q 6 prn  HEMATOLOGIC A:   Anemia due to CKD Leukocytosis, mildly improving  P:  DVT px: SQ heparin Monitor CBC intermittently Transfuse per usual ICU guidelines  INFECTIOUS A:   Elevated PCT Asymmetric infiltrates - possible PNA P:   BCx2 10/16 >>  UC  10/16 >>  NEG U. Strep 10/16 >> NEG  Vanc 10/16 >>  Ceftrx 10/16 >>  Azithro 10/16 >> 10/18  PCT  protocol   ENDOCRINE A:   DM 2 with renal complications P:   SSI aspart and lantus Holding home NPH  NEUROLOGIC A:   Acute Encephalopathy - in the setting of uremia / AKI P:   RASS goal: 0 Minimize sedatives Delirium prevention measures   Family updated:  No family at bedside this AM.    Interdisciplinary Family Meeting v Palliative Care Meeting:    TODAY'S SUMMARY:  Transfer to SDU/TRH. With current nausea and vomiting events, suspicion for aspiration PNA vs CAP? Would consider anaerobic antibiotic coverage if patient continues with failure to resolve PNA? Patient has expressed that he  has lived a full life and that he is "blessed" to be living any day past 66years. Will consult with pallative, chaplain will be assessing.    Merton Border, MD ; [facility] [phone].  After 5:30 PM or weekends, call [phone] 07/01/2014, 11:23 AM [facility] [phone] or if no answer [phone]

## 2014-07-01 NOTE — Procedures (Signed)
Tolerating hemodialysis and we will reduce volume removal as he looks a little dry.   He is a little agitated. Kimley Apsey C

## 2014-07-01 NOTE — Progress Notes (Signed)
Chaplain responded to referral from RN at rounds.  Pt seemed extremely tired and had just received IV medication for nausea.  Although pt tried to communicate with chaplain, pt was simply too sleepy.  Chaplain will follow up with pt at later time. RN informed chaplain that pt seems to be "throwing in the towel" with regards to pt's healthcare.  Chaplain will seek to follow up on these comments and concerns.   07/01/14 1100  Clinical Encounter Type  Visited With Patient;Health care provider  Visit Type Initial  Referral From Nurse  Spiritual Encounters  Spiritual Needs Emotional  Stress Factors  Patient Stress Factors Exhausted;Health changes;Loss;Loss of control  Family Stress Factors None identified  Advance Directives (For Healthcare)  Does patient have an advance directive? No   Mertie Moores, Chaplain

## 2014-07-01 NOTE — Progress Notes (Signed)
Patient ID: EATON DICRISTINA, male    DOB: February 15, 1946, 68 y.o.   MRN: [id number]  S: awake, says he is "so so", breathing okay. Would like to eat.   O:BP 117/61  Pulse 78  Temp(Src) 97.6 F (36.4 C) (Axillary)  Resp 17  Ht 5' 8.9" (1.75 m)  Wt 219 lb 9.3 oz (99.6 kg)  BMI 32.52 kg/m2  SpO2 91%  Intake/Output Summary (Last 24 hours) at 07/01/14 0848 Last data filed at 07/01/14 0000  Gross per 24 hour  Intake    490 ml  Output    301 ml  Net    189 ml   Intake/Output: I/O last 3 completed shifts: In: 61 [P.O.:360; I.V.:130] Out: 301 [Urine:300; Stool:1]  Intake/Output this shift:    Weight change: -2 lb 3.3 oz (-1 kg)  Gen: NAD CVS: RRR, normal heart sounds, no murmurs Resp: CTAB Abd: soft, +BS Ext: trace edema bilat   Recent Labs Lab 06/28/14 0755 06/28/14 0818 06/28/14 2208 06/29/14 0500 06/30/14 0250 07/01/14 0444  NA 136* 137  --  138 137 137  K 5.6* 5.3  --  4.9 4.2 4.9  CL 102 112  --  101 96 95*  CO2 10*  --   --  18* 22 21  GLUCOSE 140* 142*  --  107* 105* 162*  BUN 92* 94*  --  67* 48* 65*  CREATININE 9.37* 10.20*  --  7.73* 6.55* 8.90*  ALBUMIN 2.1*  --   --   --  2.0*  --   CALCIUM 7.7*  --   --  7.7* 7.9* 7.8*  PHOS  --   --  5.9*  --  7.4*  --   AST 20  --   --   --   --   --   ALT 13  --   --   --   --   --    Liver Function Tests:  Recent Labs Lab 06/28/14 0755 06/30/14 0250  AST 20  --   ALT 13  --   ALKPHOS 117  --   BILITOT 0.3  --   PROT 7.7  --   ALBUMIN 2.1* 2.0*   No results found for this basename: LIPASE, AMYLASE,  in the last 168 hours No results found for this basename: AMMONIA,  in the last 168 hours CBC:  Recent Labs Lab 06/28/14 0755 06/28/14 0818 06/29/14 0500 07/01/14 0444  WBC 15.8*  --  13.2* 13.1*  NEUTROABS 13.4*  --   --   --   HGB 9.3* 10.9* 8.4* 8.7*  HCT 28.4* 32.0* 26.1* 27.0*  MCV 82.6  --  83.7 83.9  PLT 394  --  341 312   Cardiac Enzymes:  Recent Labs Lab 06/28/14 0755 06/28/14 1000  06/28/14 2208  TROPONINI <0.30 <0.30 <0.30   CBG:  Recent Labs Lab 06/30/14 0746 06/30/14 1139 06/30/14 1610 06/30/14 2010 06/30/14 2254  GLUCAP 108* 184* 147* 239* 252*    Iron Studies: No results found for this basename: IRON, TIBC, TRANSFERRIN, FERRITIN,  in the last 72 hours Studies/Results: Dg Chest Port 1 View  07/01/2014   CLINICAL DATA:  Acute respiratory failure, shortness of breath; acute superimposed upon chronic renal failure, diabetes, hypertension  EXAM: PORTABLE CHEST - 1 VIEW  COMPARISON:  Portable chest x-ray of June 30, 2014  FINDINGS: The lungs are adequately inflated. The interstitial markings remain increased but have improved since yesterday's study. The cardiopericardial silhouette remains enlarged. The central  pulmonary vascularity is prominent. The large caliber right internal jugular venous catheter tip overlies the proximal portion of the SVC. The bony thorax is unremarkable where visualized.  IMPRESSION: There has been slight interval improvement in the appearance of the pulmonary interstitium which may reflect resolving interstitial edema. Significant abnormalities remain however.   Electronically Signed   By: David  Martinique   On: 07/01/2014 07:49   Dg Chest Port 1 View  06/30/2014   CLINICAL DATA:  Respiratory failure.  EXAM: PORTABLE CHEST - 1 VIEW  COMPARISON:  06/29/2014  FINDINGS: Stable position of the right jugular central venous catheter. Catheter tip in the upper SVC region. Again noted are bilateral airspace and interstitial markings, particularly in the right upper lung. The lung opacities have not significantly changed. Heart size is stable. Negative for a pneumothorax.  IMPRESSION: Stable interstitial and airspace densities. Findings are most compatible with pulmonary edema.  Stable position of central line.   Electronically Signed   By: Markus Daft M.D.   On: 06/30/2014 07:48   . amLODipine  10 mg Oral Daily  . antiseptic oral rinse  7 mL Mouth  Rinse q12n4p  . aspirin EC  81 mg Oral Daily  . atorvastatin  20 mg Oral q1800  . buPROPion  300 mg Oral Daily  . calcitRIOL  0.25 mcg Oral Daily  . cefTRIAXone (ROCEPHIN)  IV  1 g Intravenous Q24H  . chlorhexidine  15 mL Mouth Rinse BID  . darbepoetin (ARANESP) injection - DIALYSIS  60 mcg Intravenous Q Sat-HD  . heparin  5,000 Units Subcutaneous 3 times per day  . insulin aspart  0-15 Units Subcutaneous TID WC  . insulin aspart  0-5 Units Subcutaneous QHS  . insulin aspart  4 Units Subcutaneous TID WC  . insulin glargine  20 Units Subcutaneous QHS  . pantoprazole  40 mg Oral Daily    BMET    Component Value Date/Time   NA 137 07/01/2014 0444   K 4.9 07/01/2014 0444   CL 95* 07/01/2014 0444   CO2 21 07/01/2014 0444   GLUCOSE 162* 07/01/2014 0444   BUN 65* 07/01/2014 0444   CREATININE 8.90* 07/01/2014 0444   CALCIUM 7.8* 07/01/2014 0444   GFRNONAA 5* 07/01/2014 0444   GFRAA 6* 07/01/2014 0444   CBC    Component Value Date/Time   WBC 13.1* 07/01/2014 0444   RBC 3.22* 07/01/2014 0444   HGB 8.7* 07/01/2014 0444   HCT 27.0* 07/01/2014 0444   PLT 312 07/01/2014 0444   MCV 83.9 07/01/2014 0444   MCH 27.0 07/01/2014 0444   MCHC 32.2 07/01/2014 0444   RDW 13.6 07/01/2014 0444   LYMPHSABS 0.5* 06/28/2014 0755   MONOABS 1.5* 06/28/2014 0755   EOSABS 0.3 06/28/2014 0755   BASOSABS 0.0 06/28/2014 0755     Assessment/Plan:  1. 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 Respiratory status still tenuous  Plan for HD today with continued UF 2. Vascular access: Temp trialysis catheter. Vascular deferred tunnel cath and RUE AVF due to respiratory status, to reschedule later this week 3. Pulmonary- currently on NRB, required intermittent bipap. Cont with UF and treatment of PNA.  4. SHPTH: Phos 7.4, Ca 7.9, iPTH pending.  On calcitriol and will need to start phosphate binders once taking po 5. ACD: weekly aranesp  6. AG metabolic acidosis: resolved with HD   7. Hyperkalemia: resolved with HD  8. DM: npo, SSI  9. HTN: amlodipine 10. CAP: abx per CCM (vanc,  ctx, azithro)  Tawanna Sat   Renal Attending : He is a little more agitated and lethargic currently on HD>  His surgery was put off due to some respiratory issues this AM.  He is tolerating HD. Trinda Harlacher C

## 2014-07-01 NOTE — Progress Notes (Signed)
ANTIBIOTIC CONSULT NOTE - FOLLOW UP  Pharmacy Consult for Vancomycin Indication: rule out pneumonia  No Known Allergies  Patient Measurements: Height: 5' 8.9" (175 cm) Weight: 215 lb 9.8 oz (97.8 kg) IBW/kg (Calculated) : 70.47  Vital Signs: Temp: 97.9 F (36.6 C) (10/19 1206) Temp Source: Axillary (10/19 1206) BP: 124/69 mmHg (10/19 1142) Pulse Rate: 74 (10/19 1142) Intake/Output from previous day: 10/18 0701 - 10/19 0700 In: 490 [P.O.:360; I.V.:130] Out: 301 [Urine:300; Stool:1] Intake/Output from this shift: Total I/O In: -  Out: 1800 [Other:1800]  Labs:  Recent Labs  06/29/14 0500 06/30/14 0250 07/01/14 0444  WBC 13.2*  --  13.1*  HGB 8.4*  --  8.7*  PLT 341  --  312  CREATININE 7.73* 6.55* 8.90*   Estimated Creatinine Clearance: 9.4 ml/min (by C-G formula based on Cr of 8.9).  Recent Labs  06/30/14 0250  VANCORANDOM 15.7     Microbiology: Recent Results (from the past 720 hour(s))  CULTURE, BLOOD (ROUTINE X 2)     Status: None   Collection Time    06/28/14  9:45 AM      Result Value Ref Range Status   Specimen Description BLOOD LEFT HAND   Final   Special Requests BOTTLES DRAWN AEROBIC AND ANAEROBIC 10ML   Final   Culture  Setup Time     Final   Value: 06/28/2014 17:15     Performed at [facility]   Culture     Final   Value:        BLOOD CULTURE RECEIVED NO GROWTH TO DATE CULTURE WILL BE HELD FOR 5 DAYS BEFORE ISSUING A FINAL NEGATIVE REPORT     Performed at [facility]   Report Status PENDING   Incomplete  CULTURE, BLOOD (ROUTINE X 2)     Status: None   Collection Time    06/28/14 10:00 AM      Result Value Ref Range Status   Specimen Description BLOOD LEFT ARM   Final   Special Requests BOTTLES DRAWN AEROBIC AND ANAEROBIC 10ML   Final   Culture  Setup Time     Final   Value: 06/28/2014 17:14     Performed at [facility]   Culture     Final   Value:        BLOOD CULTURE RECEIVED NO GROWTH TO DATE CULTURE WILL BE  HELD FOR 5 DAYS BEFORE ISSUING A FINAL NEGATIVE REPORT     Performed at [facility]   Report Status PENDING   Incomplete  MRSA PCR SCREENING     Status: None   Collection Time    06/28/14  1:57 PM      Result Value Ref Range Status   MRSA by PCR NEGATIVE  NEGATIVE Final   Comment:            The GeneXpert MRSA Assay (FDA     approved for NASAL specimens     only), is one component of a     comprehensive MRSA colonization     surveillance program. It is not     intended to diagnose MRSA     infection nor to guide or     monitor treatment for     MRSA infections.  URINE CULTURE     Status: None   Collection Time    06/28/14  2:17 PM      Result Value Ref Range Status   Specimen Description URINE, CLEAN CATCH  Final   Special Requests NONE   Final   Culture  Setup Time     Final   Value: 06/28/2014 21:00     Performed at [facility] Count     Final   Value: NO GROWTH     Performed at [facility]   Culture     Final   Value: NO GROWTH     Performed at [facility]   Report Status 06/29/2014 FINAL   Final    Anti-infectives   Start     Dose/Rate Route Frequency Ordered Stop   07/01/14 1500  vancomycin (VANCOCIN) IVPB 1000 mg/200 mL premix     1,000 mg 200 mL/hr over 60 Minutes Intravenous  Once 07/01/14 1419     07/01/14 0600  ceFAZolin (ANCEF) IVPB 1 g/50 mL premix  Status:  Discontinued     1 g 100 mL/hr over 30 Minutes Intravenous On call to O.R. 06/28/14 1303 06/28/14 1323   07/01/14 0600  ceFAZolin (ANCEF) IVPB 2 g/50 mL premix  Status:  Discontinued     2 g 100 mL/hr over 30 Minutes Intravenous 60 min pre-op 06/28/14 1324 06/30/14 1121   07/01/14 0600  cefUROXime (ZINACEF) 1.5 g in dextrose 5 % 50 mL IVPB  Status:  Discontinued     1.5 g 100 mL/hr over 30 Minutes Intravenous 30 min pre-op 06/30/14 1122 06/30/14 1237   06/30/14 1130  vancomycin (VANCOCIN) 500 mg in sodium chloride 0.9 % 100 mL IVPB     500 mg 100 mL/hr  over 60 Minutes Intravenous  Once 06/30/14 1120 06/30/14 1302   06/29/14 1000  azithromycin (ZITHROMAX) 500 mg in dextrose 5 % 250 mL IVPB  Status:  Discontinued     500 mg 250 mL/hr over 60 Minutes Intravenous Every 24 hours 06/28/14 1137 06/30/14 1237   06/29/14 1000  cefTRIAXone (ROCEPHIN) 1 g in dextrose 5 % 50 mL IVPB     1 g 100 mL/hr over 30 Minutes Intravenous Every 24 hours 06/28/14 1137     06/28/14 1330  vancomycin (VANCOCIN) 500 mg in sodium chloride 0.9 % 100 mL IVPB  Status:  Discontinued     500 mg 100 mL/hr over 60 Minutes Intravenous Every Fri (Hemodialysis) 06/28/14 1320 06/29/14 1410   06/28/14 0930  vancomycin (VANCOCIN) 2,000 mg in sodium chloride 0.9 % 500 mL IVPB     2,000 mg 250 mL/hr over 120 Minutes Intravenous  Once 06/28/14 0846 06/28/14 1258   06/28/14 0815  cefTRIAXone (ROCEPHIN) 1 g in dextrose 5 % 50 mL IVPB     1 g 100 mL/hr over 30 Minutes Intravenous  Once 06/28/14 0814 06/28/14 0859   06/28/14 0815  azithromycin (ZITHROMAX) 500 mg in dextrose 5 % 250 mL IVPB     500 mg 250 mL/hr over 60 Minutes Intravenous  Once 06/28/14 0814 06/28/14 1003      Assessment: 67 YOM who continues on Vancomycin per Rx + Rocephin for r/o PNA (abx Day #4). Patient tolerated 4-hr HD session today at BFR 400. Pharmacy consulted to dose vancomycin post-HD. Afebrile, WBC 13.1.  Goal of Therapy:  Pre-HD Vancomycin level of 15-25 mcg/ml  Plan:  1. Vancomycin 1 g IV x 1 dose today 2. Will continue to follow HD schedule/duration, culture results, LOT, and antibiotic de-escalation plans   Whitney Muse, PharmD Candidate  07/01/2014 2:19 PM    I agree with above assessment and plan.  Sherlon Handing, PharmD, BCPS Clinical  pharmacist, pager [phone] 07/01/2014  2:53 PM

## 2014-07-01 NOTE — Progress Notes (Signed)
Placed pt. On bipap due to pt. desatting.

## 2014-07-01 NOTE — Consult Note (Signed)
Pt currently on dialysis via right side HD catheter.  Plan to remove 3L today.  He is intermittently requiring BiPAP for his pulmonary edema/pneumonia.  Planned to have tunneled HD cath and fistula today but in light of his current marginal pulmonary function will defer this until he is at least consistently off of BiPAP.  Will follow and reschedule for later in the week when pulmonary status is improved.  Ruta Hinds, MD [facility] Office: [phone] Pager: [phone]

## 2014-07-01 NOTE — Progress Notes (Signed)
Chaplain visited to follow up with pt from earlier visit.  Pt was asleep and exposed.  Chaplain pulled curtain and informed RN.  Chaplain advocated for pt's dignity.  Will follow up as needed.  07/01/14 1300  Clinical Encounter Type  Visited With Patient not available  Visit Type Follow-up;Critical Care  Stress Factors  Patient Stress Factors Exhausted   Mertie Moores, Chaplain

## 2014-07-01 NOTE — Progress Notes (Signed)
[place] TEAM 1 - Stepdown/ICU TEAM Progress Note  CLIDE BRAINERD [id number] DOB: 11/22/1945 DOA: 06/28/2014 PCP: [name], MD  Admit HPI / Brief Narrative: 68 y/o M, non-smoker, with PMH of HTN, HLD, DM, and CKD with recent work up by Dr. Oneida Alar for AVF placement and planned start of HD in the near future who presented to [facility] ER on 10/15 with complaints of worsening shortness of breath for a week, or more.  He was seen in office by Dr. Oneida Alar on 10/15 for review of potential AVF placement. Office notes at that time reflect he was 84% on RA. Per notes, he was planned to have a graft placed on 10/28.   On presentation to the ER, he was noted to have significant shortness of breath and saturations of 47%. He was placed on BiPAP for increased work of breathing. CXR was concerning for R sided airspace disease (edema vs infiltrate).   HPI/Subjective: Pt c/o nausea and dry mouth.  Reports some vomiting last night.  Denies current cp, sob, or abdom pain.  States he is having intermittent loose stools.    Assessment/Plan:  Acute Hypoxemic Respiratory Failure  pulm edema + possible CAP  appears to be stabilizing w/ ongoing HD - CXR this am suggests modest improvement   Acute on CKD HD initiated during this admission - temp trialysis catheter - planned 10/19 tunnel cath and RUE AVF - creatinine in July 2012 was 1.9 - crt 2.17 Oct 2012  Acute Encephalopathy - in the setting of uremia / AKI Appears to be slowly improving - check B12 and folate for completeness  Anemia of chronic kidney disease Check Fe indices - epo +/- Fe per Nephrology   Hyperkalemia resolved w/ HD   DM2 w/ renal complications  Erratic control - follow w/o change today - check A1c  HTN BP well controlled at present   HLD Lipid panel today   Obesity - Body mass index is 32.52 kg/(m^2).  Code Status: FULL Family Communication: no family present at time of exam Disposition Plan:  SDU  Consultants: Vasc Surgery  PCCM > [facility]  Nephrology    Procedures: 10/16 - temp HD catheter  Antibiotics: Vanc 10/16 > Ceftrx 10/16 > Azithro 10/16 >  DVT prophylaxis: SQ heparin   Objective: Blood pressure 112/72, pulse 77, temperature 97.6 F (36.4 C), temperature source Axillary, resp. rate 18, height 5' 8.9" (1.75 m), weight 99.6 kg (219 lb 9.3 oz), SpO2 98.00%.  Intake/Output Summary (Last 24 hours) at 07/01/14 0812 Last data filed at 07/01/14 0000  Gross per 24 hour  Intake    490 ml  Output    301 ml  Net    189 ml   Exam: General: No acute respiratory distress at rest in bed on venti mask  Lungs: mild diffuse crackles - no wheeze  Cardiovascular: Regular rate and rhythm without murmur gallop or rub  Abdomen: Nontender, nondistended, soft, bowel sounds positive, no rebound, no ascites, no appreciable mass Extremities: No significant cyanosis, clubbing;  trace edema bilateral lower extremities  Data Reviewed: Basic Metabolic Panel:  Recent Labs Lab 06/28/14 0755 06/28/14 0818 06/28/14 2208 06/29/14 0500 06/30/14 0250 07/01/14 0444  NA 136* 137  --  138 137 137  K 5.6* 5.3  --  4.9 4.2 4.9  CL 102 112  --  101 96 95*  CO2 10*  --   --  18* 22 21  GLUCOSE 140* 142*  --  107* 105* 162*  BUN 92*  94*  --  67* 48* 65*  CREATININE 9.37* 10.20*  --  7.73* 6.55* 8.90*  CALCIUM 7.7*  --   --  7.7* 7.9* 7.8*  PHOS  --   --  5.9*  --  7.4*  --     Liver Function Tests:  Recent Labs Lab 06/28/14 0755 06/30/14 0250  AST 20  --   ALT 13  --   ALKPHOS 117  --   BILITOT 0.3  --   PROT 7.7  --   ALBUMIN 2.1* 2.0*    Coags: No results found for this basename: PT, INR,  in the last 168 hours No results found for this basename: APTT,  in the last 168 hours  CBC:  Recent Labs Lab 06/28/14 0755 06/28/14 0818 06/29/14 0500 07/01/14 0444  WBC 15.8*  --  13.2* 13.1*  NEUTROABS 13.4*  --   --   --   HGB 9.3* 10.9* 8.4* 8.7*  HCT 28.4* 32.0* 26.1*  27.0*  MCV 82.6  --  83.7 83.9  PLT 394  --  341 312    Cardiac Enzymes:  Recent Labs Lab 06/28/14 0755 06/28/14 1000 06/28/14 2208  TROPONINI <0.30 <0.30 <0.30   BNP (last 3 results)  Recent Labs  06/28/14 0755  PROBNP 1556.0*    CBG:  Recent Labs Lab 06/30/14 0746 06/30/14 1139 06/30/14 1610 06/30/14 2010 06/30/14 2254  GLUCAP 108* 184* 147* 239* 252*    Recent Results (from the past 240 hour(s))  CULTURE, BLOOD (ROUTINE X 2)     Status: None   Collection Time    06/28/14  9:45 AM      Result Value Ref Range Status   Specimen Description BLOOD LEFT HAND   Final   Special Requests BOTTLES DRAWN AEROBIC AND ANAEROBIC 10ML   Final   Culture  Setup Time     Final   Value: 06/28/2014 17:15     Performed at [facility]   Culture     Final   Value:        BLOOD CULTURE RECEIVED NO GROWTH TO DATE CULTURE WILL BE HELD FOR 5 DAYS BEFORE ISSUING A FINAL NEGATIVE REPORT     Performed at [facility]   Report Status PENDING   Incomplete  CULTURE, BLOOD (ROUTINE X 2)     Status: None   Collection Time    06/28/14 10:00 AM      Result Value Ref Range Status   Specimen Description BLOOD LEFT ARM   Final   Special Requests BOTTLES DRAWN AEROBIC AND ANAEROBIC 10ML   Final   Culture  Setup Time     Final   Value: 06/28/2014 17:14     Performed at [facility]   Culture     Final   Value:        BLOOD CULTURE RECEIVED NO GROWTH TO DATE CULTURE WILL BE HELD FOR 5 DAYS BEFORE ISSUING A FINAL NEGATIVE REPORT     Performed at [facility]   Report Status PENDING   Incomplete  MRSA PCR SCREENING     Status: None   Collection Time    06/28/14  1:57 PM      Result Value Ref Range Status   MRSA by PCR NEGATIVE  NEGATIVE Final   Comment:            The GeneXpert MRSA Assay (FDA     approved for NASAL specimens     only),  is one component of a     comprehensive MRSA colonization     surveillance program. It is not     intended to diagnose  MRSA     infection nor to guide or     monitor treatment for     MRSA infections.  URINE CULTURE     Status: None   Collection Time    06/28/14  2:17 PM      Result Value Ref Range Status   Specimen Description URINE, CLEAN CATCH   Final   Special Requests NONE   Final   Culture  Setup Time     Final   Value: 06/28/2014 21:00     Performed at [facility] Count     Final   Value: NO GROWTH     Performed at [facility]   Culture     Final   Value: NO GROWTH     Performed at [facility]   Report Status 06/29/2014 FINAL   Final     Studies:  Recent x-ray studies have been reviewed in detail by the Attending Physician  Scheduled Meds:  Scheduled Meds: . amLODipine  10 mg Oral Daily  . antiseptic oral rinse  7 mL Mouth Rinse q12n4p  . aspirin EC  81 mg Oral Daily  . atorvastatin  20 mg Oral q1800  . buPROPion  300 mg Oral Daily  . calcitRIOL  0.25 mcg Oral Daily  . cefTRIAXone (ROCEPHIN)  IV  1 g Intravenous Q24H  . chlorhexidine  15 mL Mouth Rinse BID  . darbepoetin (ARANESP) injection - DIALYSIS  60 mcg Intravenous Q Sat-HD  . heparin  5,000 Units Subcutaneous 3 times per day  . insulin aspart  0-15 Units Subcutaneous TID WC  . insulin aspart  0-5 Units Subcutaneous QHS  . insulin aspart  4 Units Subcutaneous TID WC  . insulin glargine  20 Units Subcutaneous QHS  . pantoprazole  40 mg Oral Daily    Time spent on care of this patient: 35 mins   Wendelin Bradt T , MD   [facility] Office  [phone] Pager - Text Page per Shea Evans as per below:  On-Call/Text Page:      [email or web address]      password [email or web address]  If 7PM-7AM, please contact night-coverage [email or web address] Password [email or web address] 07/01/2014, 8:12 AM   LOS: 3 days

## 2014-07-01 NOTE — ED Provider Notes (Signed)
Medical screening examination/treatment/procedure(s) were conducted as a shared visit with non-physician practitioner(s) and myself.  I personally evaluated the patient during the encounter. Please see my previous note.   EKG Interpretation   Date/Time:  Friday June 28 2014 07:25:08 EDT Ventricular Rate:  89 PR Interval:  167 QRS Duration: 90 QT Interval:  387 QTC Calculation: 471 R Axis:   37 Text Interpretation:  Normal sinus rhythm Artifact Baseline wander When  compared with ECG of 10/17/2012 No significant change was found Confirmed by  [facility]  MD, Teigan Sahli ([id number]) on 06/28/2014 7:31:20 AM        Francine Graven, DO 07/01/14 1720

## 2014-07-02 ENCOUNTER — Inpatient Hospital Stay (HOSPITAL_COMMUNITY): Payer: Medicare Other

## 2014-07-02 DIAGNOSIS — R0602 Shortness of breath: Secondary | ICD-10-CM

## 2014-07-02 DIAGNOSIS — R0600 Dyspnea, unspecified: Secondary | ICD-10-CM

## 2014-07-02 DIAGNOSIS — R110 Nausea: Secondary | ICD-10-CM

## 2014-07-02 DIAGNOSIS — E785 Hyperlipidemia, unspecified: Secondary | ICD-10-CM

## 2014-07-02 DIAGNOSIS — Z515 Encounter for palliative care: Secondary | ICD-10-CM

## 2014-07-02 DIAGNOSIS — I10 Essential (primary) hypertension: Secondary | ICD-10-CM

## 2014-07-02 DIAGNOSIS — D631 Anemia in chronic kidney disease: Secondary | ICD-10-CM

## 2014-07-02 DIAGNOSIS — N189 Chronic kidney disease, unspecified: Secondary | ICD-10-CM

## 2014-07-02 LAB — IRON AND TIBC
Iron: 18 ug/dL — ABNORMAL LOW (ref 42–135)
SATURATION RATIOS: 14 % — AB (ref 20–55)
TIBC: 125 ug/dL — ABNORMAL LOW (ref 215–435)
UIBC: 107 ug/dL — ABNORMAL LOW (ref 125–400)

## 2014-07-02 LAB — CBC
HEMATOCRIT: 31.2 % — AB (ref 39.0–52.0)
HEMOGLOBIN: 9.6 g/dL — AB (ref 13.0–17.0)
MCH: 26.4 pg (ref 26.0–34.0)
MCHC: 30.8 g/dL (ref 30.0–36.0)
MCV: 86 fL (ref 78.0–100.0)
Platelets: 282 10*3/uL (ref 150–400)
RBC: 3.63 MIL/uL — AB (ref 4.22–5.81)
RDW: 13.7 % (ref 11.5–15.5)
WBC: 12.2 10*3/uL — AB (ref 4.0–10.5)

## 2014-07-02 LAB — GLUCOSE, CAPILLARY
GLUCOSE-CAPILLARY: 87 mg/dL (ref 70–99)
GLUCOSE-CAPILLARY: 224 mg/dL — AB (ref 70–99)
Glucose-Capillary: 150 mg/dL — ABNORMAL HIGH (ref 70–99)

## 2014-07-02 LAB — RETICULOCYTES
RBC.: 3.63 MIL/uL — ABNORMAL LOW (ref 4.22–5.81)
Retic Count, Absolute: 101.6 10*3/uL (ref 19.0–186.0)
Retic Ct Pct: 2.8 % (ref 0.4–3.1)

## 2014-07-02 LAB — COMPREHENSIVE METABOLIC PANEL
ALT: 16 U/L (ref 0–53)
AST: 20 U/L (ref 0–37)
Albumin: 2 g/dL — ABNORMAL LOW (ref 3.5–5.2)
Alkaline Phosphatase: 114 U/L (ref 39–117)
Anion gap: 20 — ABNORMAL HIGH (ref 5–15)
BUN: 32 mg/dL — ABNORMAL HIGH (ref 6–23)
CO2: 22 meq/L (ref 19–32)
CREATININE: 6.06 mg/dL — AB (ref 0.50–1.35)
Calcium: 8.2 mg/dL — ABNORMAL LOW (ref 8.4–10.5)
Chloride: 97 mEq/L (ref 96–112)
GFR, EST AFRICAN AMERICAN: 10 mL/min — AB (ref >90)
GFR, EST NON AFRICAN AMERICAN: 9 mL/min — AB (ref >90)
GLUCOSE: 91 mg/dL (ref 70–99)
Potassium: 4.2 mEq/L (ref 3.7–5.3)
SODIUM: 139 meq/L (ref 137–147)
Total Bilirubin: 0.3 mg/dL (ref 0.3–1.2)
Total Protein: 7.9 g/dL (ref 6.0–8.3)

## 2014-07-02 LAB — VITAMIN B12: Vitamin B-12: 957 pg/mL — ABNORMAL HIGH (ref 211–911)

## 2014-07-02 LAB — HEMOGLOBIN A1C
Hgb A1c MFr Bld: 6 % — ABNORMAL HIGH (ref <5.7)
Mean Plasma Glucose: 126 mg/dL — ABNORMAL HIGH (ref <117)

## 2014-07-02 LAB — FOLATE: Folate: 7 ng/mL

## 2014-07-02 LAB — APTT: aPTT: 34 seconds (ref 24–37)

## 2014-07-02 LAB — PROTIME-INR
INR: 1.33 (ref 0.00–1.49)
Prothrombin Time: 16.6 seconds — ABNORMAL HIGH (ref 11.6–15.2)

## 2014-07-02 LAB — FERRITIN: FERRITIN: 765 ng/mL — AB (ref 22–322)

## 2014-07-02 MED ORDER — FUROSEMIDE 10 MG/ML IJ SOLN
60.0000 mg | Freq: Once | INTRAMUSCULAR | Status: AC
  Administered 2014-07-02: 60 mg via INTRAVENOUS
  Filled 2014-07-02: qty 6

## 2014-07-02 MED ORDER — VANCOMYCIN HCL IN DEXTROSE 750-5 MG/150ML-% IV SOLN
750.0000 mg | Freq: Once | INTRAVENOUS | Status: AC
  Administered 2014-07-02: 750 mg via INTRAVENOUS
  Filled 2014-07-02: qty 150

## 2014-07-02 NOTE — Consult Note (Signed)
Patient Blake [name]      DOB: 1945-12-31      YT:[id number]     Consult Note from the Palliative Medicine Team at [place] Requested by:  Dr. Burgess Amor. Al     PCP: [name], MD Reason for Consultation: [place]     Phone [phone]  Assessment of patients Current state: 68 yr old african [place] male with a past medical history for CKD, HTN, HLD and DM.  Patient had just been to see Nephrology and Vascular consult was recommend for AVF placement.  He reports worsening shortness of breath for the last 1 month. States the most exercise he does is gets up and around and walks to the mailbox (usual driveway distance).  Reports for the past few weeks has had increasing shortness of breath when going to get the mail.  He is retired and lives with his sister and niece Blake [name]).  He gave me permission to talk with Blake [name] and his nephew Blake [name] about his medical condition and he is considering appointing a medical POA .  He is not married and has no children.  He seems to have very limited understanding of his health issues.  He states if he knew now what he would have to go through that he would have lived a different life.  He reports that at this time his goal is to "get better".  He admits that he has never thought through his advanced care planning and states "I can do dialysis that's no problem".  When thinking about the life limiting part of his illness he hits a wall in his mind .  Currently, he reports a desire to undergo CPR and life support if needed. We talked a little about chronic illness and how reassessment of that choice as time goes on is always good.     Goals of Care: 1.  Code Status: Full Code   2. Scope of Treatment: Continue full scope of treatment  4. Disposition: Lives at home with his sister and Neice   3. Symptom Management: 1.  Nausea: prn zofran 2. Dyspnea: continue dialysis and prn bipap    4. Psychosocial: Patient is retired from  working in [employer] . He has at least two sisters- lives with Earleen Reaper and her daughter Blake [name] , they have a son/brother Blake [name]. When asked who his go to people should be he identified these folks. He has never been married and reports no children.  5. Spiritual: some important but not overwhelming.        Patient Documents Completed or Given: Document Given Completed  Advanced Directives Pkt    MOST    DNR    Gone from My Sight    Hard Choices      Brief HPI: 68 yr old with multiple comorbiti presents with several weeks of increasing shortness of breath.  Just had seen Vascular to proceed with access for dialysis when he was admitted with respiratory failure and volume overload.  We were asked to assist with goals of care.   ROS: slightly nauseated, short of breath at rest. Denies chest pain     PMH:  Past Medical History  Diagnosis Date  . Diabetes mellitus without complication   . Hypertension   . Hyperlipidemia   . Renal disorder      PSH: Past Surgical History  Procedure Laterality Date  . Back surgery     I have reviewed the FH and SH and  If appropriate update it with new information. No Known Allergies Scheduled Meds: . aspirin EC  81 mg Oral Daily  . atorvastatin  20 mg Oral q1800  . buPROPion  300 mg Oral Daily  . calcitRIOL  0.25 mcg Oral Daily  . cefTRIAXone (ROCEPHIN)  IV  1 g Intravenous Q24H  . darbepoetin (ARANESP) injection - DIALYSIS  60 mcg Intravenous Q Sat-HD  . feeding supplement (NEPRO CARB STEADY)  237 mL Oral BID BM  . heparin  5,000 Units Subcutaneous 3 times per day  . insulin aspart  0-15 Units Subcutaneous TID WC  . insulin aspart  0-5 Units Subcutaneous QHS  . insulin aspart  4 Units Subcutaneous TID WC  . insulin glargine  20 Units Subcutaneous QHS  . metoCLOPramide (REGLAN) injection  5 mg Intravenous 4 times per day  . pantoprazole  40 mg Oral Daily   Continuous Infusions: . sodium chloride Stopped (07/02/14 0500)    PRN Meds:.promethazine    BP 107/52  Pulse 76  Temp(Src) 97.9 F (36.6 C) (Oral)  Resp 18  Ht 5' 8.9" (1.75 m)  Wt 98 kg (216 lb 0.8 oz)  BMI 32.00 kg/m2  SpO2 98%   PPS:40 %   Intake/Output Summary (Last 24 hours) at 07/02/14 0955 Last data filed at 07/02/14 0500  Gross per 24 hour  Intake    500 ml  Output   1800 ml  Net  -1300 ml   LBM:PTA  Physical Exam:  General: mild distress, HEENT:  PERRL, EOMI, anicteric, mm Chest:   Decreased with some basilar crackles bilaterally CVS: regular with some pvc, S1, S2 Abdomen:obese, soft, not tender or distended Ext: warm, no edema Neuro:awake, alert and much more oriented than yesterday. Power5/5, DTR 2+  Labs: CBC    Component Value Date/Time   WBC 12.2* 07/02/2014 0245   RBC 3.63* 07/02/2014 0245   RBC 3.63* 07/02/2014 0245   HGB 9.6* 07/02/2014 0245   HCT 31.2* 07/02/2014 0245   PLT 282 07/02/2014 0245   MCV 86.0 07/02/2014 0245   MCH 26.4 07/02/2014 0245   MCHC 30.8 07/02/2014 0245   RDW 13.7 07/02/2014 0245   LYMPHSABS 0.5* 06/28/2014 0755   MONOABS 1.5* 06/28/2014 0755   EOSABS 0.3 06/28/2014 0755   BASOSABS 0.0 06/28/2014 0755     CMP     Component Value Date/Time   NA 139 07/02/2014 0245   K 4.2 07/02/2014 0245   CL 97 07/02/2014 0245   CO2 22 07/02/2014 0245   GLUCOSE 91 07/02/2014 0245   BUN 32* 07/02/2014 0245   CREATININE 6.06* 07/02/2014 0245   CALCIUM 8.2* 07/02/2014 0245   PROT 7.9 07/02/2014 0245   ALBUMIN 2.0* 07/02/2014 0245   AST 20 07/02/2014 0245   ALT 16 07/02/2014 0245   ALKPHOS 114 07/02/2014 0245   BILITOT 0.3 07/02/2014 0245   GFRNONAA 9* 07/02/2014 0245   GFRAA 10* 07/02/2014 0245    Chest Xray Reviewed/Impressions:  Slightly increased prominence of the pulmonary interstitium on the  right may reflect asymmetric pulmonary edema or less likely  interstitial pneumonia. The pulmonary interstitium on the left has  improved slightly. When the patient can tolerate the  procedure, a PA  and lateral chest x-ray would be useful.     Time In Time Out Total Time Spent with Patient Total Overall Time  910 am 940 am + phone time  30 am  30 + 20 min phone time    Greater than 50%  of this time was spent counseling and coordinating care related to the above assessment and plan.   Loyce Klasen L. Lovena Le, MD MBA The Palliative Medicine Team at [facility] Phone: [phone] Pager: [phone] ( Use team phone after hours)

## 2014-07-02 NOTE — Progress Notes (Signed)
[place] TEAM 1 - Stepdown/ICU TEAM Progress Note  Blake [name] [id number] DOB: 16-Jul-1946 DOA: 06/28/2014 PCP: [name], MD  Admit HPI / Brief Narrative: 68 y/o BM, PMHx non-smoker, HTN, HLD, DM, CKD with recent work up by Dr. Oneida Alar for AVF placement and planned start of HD in the near future who presented to [facility] ER on 10/15 with complaints of worsening shortness of breath x 1 month. Specifically it has worsened over the past week prior to presentation. He was seen in office by Dr. Oneida Alar on 10/15 for review of potential AVF placement. Office notes at that time reflect he was 84% on RA. Per notes, he was planned to have a graft placed on 10/28. Niece (who lives with the patient) reports he has had a couple of falls over the last few months. At baseline, he continues to drive and is able to perform ADL's.  On presentation to the ER, he was noted to have significant shortness of breath and saturations of 47%. He was placed on BiPAP for increased work of breathing. CXR was concerning for R sided airspace disease (edema vs infiltrate). Patient denies fevers, chills, cough, sputum production, chest pain, n/v/d. Labs: Na 137, K 5.3, BUN 94, Sr CR 10.20, lactate 1.17, BNP 1556 and Hgb 10.9. PCCM called for ICU admission.   HPI/Subjective: 10/20 A./O. x4, NAD, negative acute respiratory distress, request to know when he is going to have a surgery for AVF. States not on home O2, however has been having progressive SOB over the past year  Assessment/Plan: Acute Hypoxemic Respiratory Failure  -Continues to require O2 nasal cannula  -Possible CAP , continue antibiotics -BiPAP dependent throughout night and during day 10/19  CXR now  Attempt O2 weaning to nasal cannula; prn BiPAP   Pulmonary edema -Lasix 60 mg x1 -Echocardiogram pending  HTN  -Within AHA guidelines without medication  -Continue aspirin 81 mg daily  HLD  -Continue Lipitor 20 mg daily,  -Obtain lipid panel DC  norvasc; has been normotensive to slightly hypotensive  Cont monitoring   Acute on Chronic Kidney Disease  -Continue Mild metabolic acidosis due to uremia  -ABG in a.m. -Monitor electrolytes closely -Strict  I/Os  -Correct electrolytes as indicated  -Nephrology following will assess tunneled HD cath placement today due to pulm status   Nausea -Resolved(likely due to gastroparesis)  -Continue Protonix 40 mg daily n  -Continue Reglan 5mg  q 6 scheduled; Phenergan 12.5 q 6 hours prn;  Anemia due to CKD  -DVT px: SQ heparin  -Monitor closely   Transfuse per usual ICU guidelines     Code Status: FULL Family Communication: no family present at time of exam Disposition Plan: Per nephrology   Consultants: Dr. Adele Barthel (vascular surgery) Dr. Erling Cruz (nephrology) Dr. Billey Chang (palliative care)   Procedure/Significant Events: 10/16 Admit to [facility] with Respiratory Failure +/- CAP, AKI  10/18 Trf to SDU ordered. TRH to assume care as of 10/19 AM. PCCM will see again as consultant  10/19 HD  10/20: Patient remained on biPAP throughout night  10/20 PCXR;Slightly increased prominence of the pulmonary interstitium,asymmetric pulmonary edema on right?    Culture BCx2 10/16 >> NEG  UC 10/16 >> NEG  U. Strep 10/16 >> NEG   Antibiotics: Azithromycin 10/16>> stopped 10/18 Ceftriaxone 10/16>> Vancomycin 10/16>>    DVT prophylaxis: Subcutaneous heparin   Devices    LINES / TUBES:      Continuous Infusions: . sodium chloride 10 mL/hr at 07/02/14 1003  Objective: VITAL SIGNS: Temp: 97.9 F (36.6 C) (10/20 0829) Temp Source: Oral (10/20 0829) BP: 125/70 mmHg (10/20 1200) Pulse Rate: 80 (10/20 1200) SPO2; 94% on 5 L O2 via [place] FIO2:   Intake/Output Summary (Last 24 hours) at 07/02/14 1231 Last data filed at 07/02/14 1200  Gross per 24 hour  Intake    780 ml  Output      0 ml  Net    780 ml     Exam: General: A./O. x4, NAD, No acute respiratory  distress Lungs: Clear to auscultation bilaterally without wheezes or crackles Cardiovascular: Regular rate and rhythm without murmur gallop or rub normal S1 and S2 Abdomen: Nontender, nondistended, soft, bowel sounds positive, no rebound, no ascites, no appreciable mass Extremities: No significant cyanosis, clubbing, or edema bilateral lower extremities  Data Reviewed: Basic Metabolic Panel:  Recent Labs Lab 06/28/14 0755 06/28/14 0818 06/28/14 2208 06/29/14 0500 06/30/14 0250 07/01/14 0444 07/02/14 0245  NA 136* 137  --  138 137 137 139  K 5.6* 5.3  --  4.9 4.2 4.9 4.2  CL 102 112  --  101 96 95* 97  CO2 10*  --   --  18* 22 21 22   GLUCOSE 140* 142*  --  107* 105* 162* 91  BUN 92* 94*  --  67* 48* 65* 32*  CREATININE 9.37* 10.20*  --  7.73* 6.55* 8.90* 6.06*  CALCIUM 7.7*  --   --  7.7* 7.9* 7.8* 8.2*  PHOS  --   --  5.9*  --  7.4*  --   --    Liver Function Tests:  Recent Labs Lab 06/28/14 0755 06/30/14 0250 07/02/14 0245  AST 20  --  20  ALT 13  --  16  ALKPHOS 117  --  114  BILITOT 0.3  --  0.3  PROT 7.7  --  7.9  ALBUMIN 2.1* 2.0* 2.0*   No results found for this basename: LIPASE, AMYLASE,  in the last 168 hours No results found for this basename: AMMONIA,  in the last 168 hours CBC:  Recent Labs Lab 06/28/14 0755 06/28/14 0818 06/29/14 0500 07/01/14 0444 07/02/14 0245  WBC 15.8*  --  13.2* 13.1* 12.2*  NEUTROABS 13.4*  --   --   --   --   HGB 9.3* 10.9* 8.4* 8.7* 9.6*  HCT 28.4* 32.0* 26.1* 27.0* 31.2*  MCV 82.6  --  83.7 83.9 86.0  PLT 394  --  341 312 282   Cardiac Enzymes:  Recent Labs Lab 06/28/14 0755 06/28/14 1000 06/28/14 2208  TROPONINI <0.30 <0.30 <0.30   BNP (last 3 results)  Recent Labs  06/28/14 0755  PROBNP 1556.0*   CBG:  Recent Labs Lab 07/01/14 1207 07/01/14 1657 07/01/14 2158 07/02/14 0826 07/02/14 1145  GLUCAP 75 134* 94 87 150*    Recent Results (from the past 240 hour(s))  CULTURE, BLOOD (ROUTINE X 2)      Status: None   Collection Time    06/28/14  9:45 AM      Result Value Ref Range Status   Specimen Description BLOOD LEFT HAND   Final   Special Requests BOTTLES DRAWN AEROBIC AND ANAEROBIC 10ML   Final   Culture  Setup Time     Final   Value: 06/28/2014 17:15     Performed at [facility]   Culture     Final   Value:        BLOOD CULTURE RECEIVED  NO GROWTH TO DATE CULTURE WILL BE HELD FOR 5 DAYS BEFORE ISSUING A FINAL NEGATIVE REPORT     Performed at [facility]   Report Status PENDING   Incomplete  CULTURE, BLOOD (ROUTINE X 2)     Status: None   Collection Time    06/28/14 10:00 AM      Result Value Ref Range Status   Specimen Description BLOOD LEFT ARM   Final   Special Requests BOTTLES DRAWN AEROBIC AND ANAEROBIC 10ML   Final   Culture  Setup Time     Final   Value: 06/28/2014 17:14     Performed at [facility]   Culture     Final   Value:        BLOOD CULTURE RECEIVED NO GROWTH TO DATE CULTURE WILL BE HELD FOR 5 DAYS BEFORE ISSUING A FINAL NEGATIVE REPORT     Performed at [facility]   Report Status PENDING   Incomplete  MRSA PCR SCREENING     Status: None   Collection Time    06/28/14  1:57 PM      Result Value Ref Range Status   MRSA by PCR NEGATIVE  NEGATIVE Final   Comment:            The GeneXpert MRSA Assay (FDA     approved for NASAL specimens     only), is one component of a     comprehensive MRSA colonization     surveillance program. It is not     intended to diagnose MRSA     infection nor to guide or     monitor treatment for     MRSA infections.  URINE CULTURE     Status: None   Collection Time    06/28/14  2:17 PM      Result Value Ref Range Status   Specimen Description URINE, CLEAN CATCH   Final   Special Requests NONE   Final   Culture  Setup Time     Final   Value: 06/28/2014 21:00     Performed at [facility] Count     Final   Value: NO GROWTH     Performed at [facility]    Culture     Final   Value: NO GROWTH     Performed at [facility]   Report Status 06/29/2014 FINAL   Final     Studies:  Recent x-ray studies have been reviewed in detail by the Attending Physician  Scheduled Meds:  Scheduled Meds: . aspirin EC  81 mg Oral Daily  . atorvastatin  20 mg Oral q1800  . buPROPion  300 mg Oral Daily  . calcitRIOL  0.25 mcg Oral Daily  . cefTRIAXone (ROCEPHIN)  IV  1 g Intravenous Q24H  . darbepoetin (ARANESP) injection - DIALYSIS  60 mcg Intravenous Q Sat-HD  . feeding supplement (NEPRO CARB STEADY)  237 mL Oral BID BM  . heparin  5,000 Units Subcutaneous 3 times per day  . insulin aspart  0-15 Units Subcutaneous TID WC  . insulin aspart  0-5 Units Subcutaneous QHS  . insulin aspart  4 Units Subcutaneous TID WC  . insulin glargine  20 Units Subcutaneous QHS  . metoCLOPramide (REGLAN) injection  5 mg Intravenous 4 times per day  . pantoprazole  40 mg Oral Daily    Time spent on care of this patient: 40 mins   Nial Hawe, J ,  MD   [facility] Office  [phone] Pager - [phone]  On-Call/Text Page:      [email or web address]      password [email or web address]  If 7PM-7AM, please contact night-coverage [email or web address] Password [email or web address] 07/02/2014, 12:31 PM   LOS: 4 days

## 2014-07-02 NOTE — Progress Notes (Signed)
PULMONARY / CRITICAL CARE MEDICINE   Name: Blake [name] MRN: [id number] DOB: [birth year]    ADMISSION DATE:  06/28/2014  REFERRING MD :  Dr. Thurnell Garbe   CHIEF COMPLAINT:  Respiratory Distress, Renal Failure   INITIAL PRESENTATION: 68 y/o M with PMH of CKD, HTN, HLD, DM, and recent work up for AVF placement who presented to [facility] ER on 10/16 with increased SOB, fatigue and hypoxemia.  Found to have a metabolic acidosis, significant worsening of renal failure and respiratory distress.  STUDIES:  10/16  CXR > R sided infiltrate vs edema   SIGNIFICANT EVENTS: 10/16  Admit to [facility] with Respiratory Failure +/- CAP, AKI 10/18 Trf to SDU ordered. TRH to assume care as of 10/19 AM. PCCM will see again as consultant 10/19 HD 10/20: Patient remained on biPAP throughout night   SUBJECTIVE:  Patient expresses frustration with BiPAP.   VITAL SIGNS: Temp:  [97 F (36.1 C)-98.7 F (37.1 C)] 98.2 F (36.8 C) (10/20 0357) Pulse Rate:  [69-88] 69 (10/20 0600) Resp:  [13-26] 13 (10/20 0600) BP: (91-154)/(49-94) 97/51 mmHg (10/20 0600) SpO2:  [83 %-100 %] 100 % (10/20 0600) FiO2 (%):  [50 %-55 %] 50 % (10/20 0004) Weight:  [97.8 kg (215 lb 9.8 oz)-98 kg (216 lb 0.8 oz)] 98 kg (216 lb 0.8 oz) (10/20 0500)  HEMODYNAMICS:    VENTILATOR SETTINGS: Vent Mode:  [-]  FiO2 (%):  [50 %-55 %] 50 %  INTAKE / OUTPUT:  Intake/Output Summary (Last 24 hours) at 07/02/14 0755 Last data filed at 07/02/14 0500  Gross per 24 hour  Intake    520 ml  Output   1800 ml  Net  -1280 ml    PHYSICAL EXAMINATION: General:  Alert, NAD Neuro:  Alert x 4. MAEs HEENT: WNL  Cardiovascular:  RRR  Lungs: scattered rhonchi Abdomen:  Soft, +BS  Ext: warm, no edema, tenting skin turgor  LABS: I have reviewed all of today's lab results. Relevant abnormalities are discussed in the A/P section  CXR: Diffuse IS edema pattern with area of focal consolidation in LLL  ASSESSMENT / PLAN:  PULMONARY A: Acute  Hypoxemic Respiratory Failure Suspected pulm edema Possible CAP  BiPAP dependent throughout night and during day 10/19 P:   CXR now Attempt O2 weaning to nasal cannula; prn BiPAP  CARDIOVASCULAR A:  Normotensive Hypertension in PMH HLD P:  Continue Lipitor, ASA DC norvasc; has been normotensive to slightly hypotensive Cont monitoring   RENAL A:   Acute on Chronic Kidney Disease Mild metabolic acidosis due to uremia P:   Monitor BMET intermittently Monitor I/Os Correct electrolytes as indicated Nephrology following will assess tunneled HD cath placement today due to pulm status  GASTROINTESTINAL A:   Chronic PPI use Nausea- likely due to gastroparesis? No abdominal pain P:   SUP: PPI  Currently NPO due to possible HD cath placement. Advance diet after Reglan 5mg  q 6 scheduled; Phenergan 12.5 q 6 hours prn; Zofran "didn't work" per [employer]   HEMATOLOGIC A:   Anemia due to CKD P:  DVT px: SQ heparin Monitor CBC intermittently Transfuse per usual ICU guidelines  INFECTIOUS A:   Elevated PCT Asymmetric infiltrates - possible PNA Leukocytosis  P:   BCx2 10/16 >> NEG UC  10/16 >>  NEG U. Strep 10/16 >> NEG  Vanc 10/16 >>  Ceftrx 10/16 >>  Azithro 10/16 >> 10/18  PCT protocol   ENDOCRINE A:   DM 2 with renal complications P:   DC  standing insulin aspart; keep SSI aspart; consider DC lantus if patient remains NPO  Holding home NPH/R  NEUROLOGIC A:   Acute Encephalopathy - in the setting of uremia / AKI?- resolved P:   RASS goal: 0 Minimize sedatives Delirium prevention measures   Family updated:  No family at bedside this AM   Interdisciplinary Family Meeting v Palliative Care Meeting:    TODAY'S SUMMARY:  Patient remains weak; nephrology will not be dialyzing until patient consistently off biPAP.  Transfer to SDU, PCCM will sign off at this time and remain as consults.   He is gradually improving and CXR is clearing. Now on [place] O2. If able to  go through night without NPPV, he can be transferred to [facility] floor. TRH is primary service.   PCCM will sign off. Please call if we can be of further assistance    Merton Border, MD ; [facility] [phone].  After 5:30 PM or weekends, call [phone]

## 2014-07-02 NOTE — Progress Notes (Signed)
Patient is currently on [facility] with sats at 97%. At this time patient is doing well and RT will not place patient on BIPAP. Will continue to monitor.

## 2014-07-02 NOTE — Progress Notes (Signed)
   Daily Progress Note  Respiratory status remains marginal.  Would not proceed at this point with [facility] and AVF placement.  Will re-evaluate later in the week.   Adele Barthel, MD [facility] [place] Office: [phone] Pager: [phone]  07/02/2014, 10:32 AM

## 2014-07-02 NOTE — Progress Notes (Signed)
ANTIBIOTIC CONSULT NOTE - FOLLOW UP  Pharmacy Consult for Vancomycin Indication: rule out pneumonia  No Known Allergies  Patient Measurements: Height: 5' 8.9" (175 cm) Weight: 217 lb 13 oz (98.8 kg) IBW/kg (Calculated) : 70.47  Vital Signs: Temp: 98.1 F (36.7 C) (10/20 1558) Temp Source: Oral (10/20 1558) BP: 106/53 mmHg (10/20 1630) Pulse Rate: 80 (10/20 1630) Intake/Output from previous day: 10/19 0701 - 10/20 0700 In: 520 [I.V.:220; IV Piggyback:300] Out: 1800  Intake/Output from this shift: Total I/O In: 410 [Other:360; IV Piggyback:50] Out: -   Labs:  Recent Labs  06/30/14 0250 07/01/14 0444 07/02/14 0245  WBC  --  13.1* 12.2*  HGB  --  8.7* 9.6*  PLT  --  312 282  CREATININE 6.55* 8.90* 6.06*   Estimated Creatinine Clearance: 13.9 ml/min (by C-G formula based on Cr of 6.06).  Recent Labs  06/30/14 0250  VANCORANDOM 15.7     Microbiology: Recent Results (from the past 720 hour(s))  CULTURE, BLOOD (ROUTINE X 2)     Status: None   Collection Time    06/28/14  9:45 AM      Result Value Ref Range Status   Specimen Description BLOOD LEFT HAND   Final   Special Requests BOTTLES DRAWN AEROBIC AND ANAEROBIC 10ML   Final   Culture  Setup Time     Final   Value: 06/28/2014 17:15     Performed at [facility]   Culture     Final   Value:        BLOOD CULTURE RECEIVED NO GROWTH TO DATE CULTURE WILL BE HELD FOR 5 DAYS BEFORE ISSUING A FINAL NEGATIVE REPORT     Performed at [facility]   Report Status PENDING   Incomplete  CULTURE, BLOOD (ROUTINE X 2)     Status: None   Collection Time    06/28/14 10:00 AM      Result Value Ref Range Status   Specimen Description BLOOD LEFT ARM   Final   Special Requests BOTTLES DRAWN AEROBIC AND ANAEROBIC 10ML   Final   Culture  Setup Time     Final   Value: 06/28/2014 17:14     Performed at [facility]   Culture     Final   Value:        BLOOD CULTURE RECEIVED NO GROWTH TO DATE CULTURE WILL  BE HELD FOR 5 DAYS BEFORE ISSUING A FINAL NEGATIVE REPORT     Performed at [facility]   Report Status PENDING   Incomplete  MRSA PCR SCREENING     Status: None   Collection Time    06/28/14  1:57 PM      Result Value Ref Range Status   MRSA by PCR NEGATIVE  NEGATIVE Final   Comment:            The GeneXpert MRSA Assay (FDA     approved for NASAL specimens     only), is one component of a     comprehensive MRSA colonization     surveillance program. It is not     intended to diagnose MRSA     infection nor to guide or     monitor treatment for     MRSA infections.  URINE CULTURE     Status: None   Collection Time    06/28/14  2:17 PM      Result Value Ref Range Status   Specimen Description URINE, CLEAN CATCH  Final   Special Requests NONE   Final   Culture  Setup Time     Final   Value: 06/28/2014 21:00     Performed at [facility] Count     Final   Value: NO GROWTH     Performed at [facility]   Culture     Final   Value: NO GROWTH     Performed at [facility]   Report Status 06/29/2014 FINAL   Final    Anti-infectives   Start     Dose/Rate Route Frequency Ordered Stop   07/01/14 1500  vancomycin (VANCOCIN) IVPB 1000 mg/200 mL premix     1,000 mg 200 mL/hr over 60 Minutes Intravenous  Once 07/01/14 1419 07/01/14 1614   07/01/14 0600  ceFAZolin (ANCEF) IVPB 1 g/50 mL premix  Status:  Discontinued     1 g 100 mL/hr over 30 Minutes Intravenous On call to O.R. 06/28/14 1303 06/28/14 1323   07/01/14 0600  ceFAZolin (ANCEF) IVPB 2 g/50 mL premix  Status:  Discontinued     2 g 100 mL/hr over 30 Minutes Intravenous 60 min pre-op 06/28/14 1324 06/30/14 1121   07/01/14 0600  cefUROXime (ZINACEF) 1.5 g in dextrose 5 % 50 mL IVPB  Status:  Discontinued     1.5 g 100 mL/hr over 30 Minutes Intravenous 30 min pre-op 06/30/14 1122 06/30/14 1237   06/30/14 1130  vancomycin (VANCOCIN) 500 mg in sodium chloride 0.9 % 100 mL IVPB     500  mg 100 mL/hr over 60 Minutes Intravenous  Once 06/30/14 1120 06/30/14 1302   06/29/14 1000  azithromycin (ZITHROMAX) 500 mg in dextrose 5 % 250 mL IVPB  Status:  Discontinued     500 mg 250 mL/hr over 60 Minutes Intravenous Every 24 hours 06/28/14 1137 06/30/14 1237   06/29/14 1000  cefTRIAXone (ROCEPHIN) 1 g in dextrose 5 % 50 mL IVPB     1 g 100 mL/hr over 30 Minutes Intravenous Every 24 hours 06/28/14 1137     06/28/14 1330  vancomycin (VANCOCIN) 500 mg in sodium chloride 0.9 % 100 mL IVPB  Status:  Discontinued     500 mg 100 mL/hr over 60 Minutes Intravenous Every Fri (Hemodialysis) 06/28/14 1320 06/29/14 1410   06/28/14 0930  vancomycin (VANCOCIN) 2,000 mg in sodium chloride 0.9 % 500 mL IVPB     2,000 mg 250 mL/hr over 120 Minutes Intravenous  Once 06/28/14 0846 06/28/14 1258   06/28/14 0815  cefTRIAXone (ROCEPHIN) 1 g in dextrose 5 % 50 mL IVPB     1 g 100 mL/hr over 30 Minutes Intravenous  Once 06/28/14 0814 06/28/14 0859   06/28/14 0815  azithromycin (ZITHROMAX) 500 mg in dextrose 5 % 250 mL IVPB     500 mg 250 mL/hr over 60 Minutes Intravenous  Once 06/28/14 0814 06/28/14 1003      Assessment: 67 YOM who continues on Vancomycin per Rx + Rocephin for r/o PNA (abx Day #5). Patient tolerated 3.5-hr HD session today at BFR 300. Pharmacy consulted to dose vancomycin post-HD. Afebrile, WBC 12.2.  Goal of Therapy:  Pre-HD Vancomycin level of 15-25 mcg/ml  Plan:  1. Vancomycin 750 mg IV x 1 dose today 2. Will continue to follow HD schedule/duration, culture results, LOT, and antibiotic de-escalation plans   Andrey Cota. Diona Foley, PharmD Clinical Pharmacist Pager [phone] 07/02/2014  4:41 PM

## 2014-07-02 NOTE — Progress Notes (Signed)
Pt taken off bipap and placed on 12L/50% Venti mask.

## 2014-07-02 NOTE — Progress Notes (Signed)
Pt remains dyspneic although better today when compared with yesterday.  Had dialysis yesterday with 2500 cc removed.  CXR suspicious for asymmetric edema so we will proceed with an additional treatment today to optimize pulmonary status.  Blake [name] C

## 2014-07-02 NOTE — Plan of Care (Signed)
Problem: Consults Goal: Skin Care Protocol Initiated - if Braden Score 18 or less If consults are not indicated, leave blank or document N/A Outcome: Completed/Met Date Met:  07/02/14 Skin is intact; some tenting noted with diuresis secondary to hemodialysis; will continue to monitor.

## 2014-07-02 NOTE — Progress Notes (Signed)
Patient ID: Blake [name], male    DOB: 05/11/1946, 68 y.o.   MRN: [id number]  S: Feels same, breathing okay currently.  O:BP 97/51  Pulse 69  Temp(Src) 98.2 F (36.8 C) (Axillary)  Resp 13  Ht 5' 8.9" (1.75 m)  Wt 216 lb 0.8 oz (98 kg)  BMI 32.00 kg/m2  SpO2 100%  Intake/Output Summary (Last 24 hours) at 07/02/14 0808 Last data filed at 07/02/14 0500  Gross per 24 hour  Intake    510 ml  Output   1800 ml  Net  -1290 ml   Intake/Output: I/O last 3 completed shifts: In: 530 [I.V.:230; IV Piggyback:300] Out: 2100 [Urine:300; Other:1800]  Intake/Output this shift:    Weight change: -3 lb 1.4 oz (-1.4 kg)  Gen: NAD CVS: RRR, normal heart sounds, no murmurs Resp: mild bibasilar crackles Abd: soft, +BS Ext: trace edema bilat   Recent Labs Lab 06/28/14 0755 06/28/14 0818 06/28/14 2208 06/29/14 0500 06/30/14 0250 07/01/14 0444 07/02/14 0245  NA 136* 137  --  138 137 137 139  K 5.6* 5.3  --  4.9 4.2 4.9 4.2  CL 102 112  --  101 96 95* 97  CO2 10*  --   --  18* 22 21 22   GLUCOSE 140* 142*  --  107* 105* 162* 91  BUN 92* 94*  --  67* 48* 65* 32*  CREATININE 9.37* 10.20*  --  7.73* 6.55* 8.90* 6.06*  ALBUMIN 2.1*  --   --   --  2.0*  --  2.0*  CALCIUM 7.7*  --   --  7.7* 7.9* 7.8* 8.2*  PHOS  --   --  5.9*  --  7.4*  --   --   AST 20  --   --   --   --   --  20  ALT 13  --   --   --   --   --  16   Liver Function Tests:  Recent Labs Lab 06/28/14 0755 06/30/14 0250 07/02/14 0245  AST 20  --  20  ALT 13  --  16  ALKPHOS 117  --  114  BILITOT 0.3  --  0.3  PROT 7.7  --  7.9  ALBUMIN 2.1* 2.0* 2.0*   No results found for this basename: LIPASE, AMYLASE,  in the last 168 hours No results found for this basename: AMMONIA,  in the last 168 hours CBC:  Recent Labs Lab 06/28/14 0755  06/29/14 0500 07/01/14 0444 07/02/14 0245  WBC 15.8*  --  13.2* 13.1* 12.2*  NEUTROABS 13.4*  --   --   --   --   HGB 9.3*  < > 8.4* 8.7* 9.6*  HCT 28.4*  < > 26.1*  27.0* 31.2*  MCV 82.6  --  83.7 83.9 86.0  PLT 394  --  341 312 282  < > = values in this interval not displayed. Cardiac Enzymes:  Recent Labs Lab 06/28/14 0755 06/28/14 1000 06/28/14 2208  TROPONINI <0.30 <0.30 <0.30   CBG:  Recent Labs Lab 06/30/14 2254 07/01/14 0835 07/01/14 1207 07/01/14 1657 07/01/14 2158  GLUCAP 252* 127* 75 134* 94    Iron Studies: No results found for this basename: IRON, TIBC, TRANSFERRIN, FERRITIN,  in the last 72 hours Studies/Results: Dg Chest Port 1 View  07/01/2014   CLINICAL DATA:  Acute respiratory failure, shortness of breath; acute superimposed upon chronic renal failure, diabetes, hypertension  EXAM: PORTABLE CHEST -  1 VIEW  COMPARISON:  Portable chest x-ray of June 30, 2014  FINDINGS: The lungs are adequately inflated. The interstitial markings remain increased but have improved since yesterday's study. The cardiopericardial silhouette remains enlarged. The central pulmonary vascularity is prominent. The large caliber right internal jugular venous catheter tip overlies the proximal portion of the SVC. The bony thorax is unremarkable where visualized.  IMPRESSION: There has been slight interval improvement in the appearance of the pulmonary interstitium which may reflect resolving interstitial edema. Significant abnormalities remain however.   Electronically Signed   By: David  Martinique   On: 07/01/2014 07:49   Dg Abd Portable 1v  07/01/2014   CLINICAL DATA:  68 year old male on dialysis with diabetes, high blood pressure and hyperlipidemia presenting with nausea.  EXAM: PORTABLE ABDOMEN - 1 VIEW  COMPARISON:  No comparison plain film examination of the abdomen  FINDINGS: Nonspecific bowel gas pattern without plain film findings of bowel obstruction.  The possibility of free intraperitoneal air cannot be assessed on a supine view.  Vascular calcifications.  IMPRESSION: Nonspecific bowel gas pattern without plain film evidence of bowel  obstruction. Please see above.   Electronically Signed   By: Chauncey Cruel M.D.   On: 07/01/2014 12:37   . amLODipine  10 mg Oral Daily  . antiseptic oral rinse  7 mL Mouth Rinse q12n4p  . aspirin EC  81 mg Oral Daily  . atorvastatin  20 mg Oral q1800  . buPROPion  300 mg Oral Daily  . calcitRIOL  0.25 mcg Oral Daily  . cefTRIAXone (ROCEPHIN)  IV  1 g Intravenous Q24H  . chlorhexidine  15 mL Mouth Rinse BID  . darbepoetin (ARANESP) injection - DIALYSIS  60 mcg Intravenous Q Sat-HD  . feeding supplement (NEPRO CARB STEADY)  237 mL Oral BID BM  . heparin  5,000 Units Subcutaneous 3 times per day  . insulin aspart  0-15 Units Subcutaneous TID WC  . insulin aspart  0-5 Units Subcutaneous QHS  . insulin aspart  4 Units Subcutaneous TID WC  . insulin glargine  20 Units Subcutaneous QHS  . metoCLOPramide (REGLAN) injection  5 mg Intravenous 4 times per day  . pantoprazole  40 mg Oral Daily    BMET    Component Value Date/Time   NA 139 07/02/2014 0245   K 4.2 07/02/2014 0245   CL 97 07/02/2014 0245   CO2 22 07/02/2014 0245   GLUCOSE 91 07/02/2014 0245   BUN 32* 07/02/2014 0245   CREATININE 6.06* 07/02/2014 0245   CALCIUM 8.2* 07/02/2014 0245   GFRNONAA 9* 07/02/2014 0245   GFRAA 10* 07/02/2014 0245   CBC    Component Value Date/Time   WBC 12.2* 07/02/2014 0245   RBC 3.63* 07/02/2014 0245   RBC 3.63* 07/02/2014 0245   HGB 9.6* 07/02/2014 0245   HCT 31.2* 07/02/2014 0245   PLT 282 07/02/2014 0245   MCV 86.0 07/02/2014 0245   MCH 26.4 07/02/2014 0245   MCHC 30.8 07/02/2014 0245   RDW 13.7 07/02/2014 0245   LYMPHSABS 0.5* 06/28/2014 0755   MONOABS 1.5* 06/28/2014 0755   EOSABS 0.3 06/28/2014 0755   BASOSABS 0.0 06/28/2014 0755     Assessment/Plan:  1. 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 Respiratory status still tenuous, CXR today with continued pulm edema. Overnight desats may be representing OSA. Continue HD today. UF goal 2L. 2. Vascular access: Temp  trialysis catheter. Vascular deferred tunnel cath and RUE AVF due to respiratory  status, to reschedule later this week 3. Pulmonary- currently on NRB, required intermittent bipap. Cont with UF and treatment of PNA.  4. SHPTH: Phos 7.4, Ca 7.9, iPTH 535.  On calcitriol and will need to start phosphate binders once taking po 5. ACD: weekly aranesp  6. AG metabolic acidosis: resolved with HD  7. Hyperkalemia: resolved with HD  8. DM: npo, SSI  9. HTN: amlodipine 10. CAP: abx per CCM (vanc, ctx, azithro)  Tawanna Sat  See my not e as well . I agree with above. Lizbeth Feijoo C

## 2014-07-03 ENCOUNTER — Encounter (HOSPITAL_COMMUNITY)
Admission: EM | Disposition: A | Discharge status: Skilled Nursing Facility | Payer: Self-pay | Source: Home / Self Care | Attending: Internal Medicine

## 2014-07-03 DIAGNOSIS — I517 Cardiomegaly: Secondary | ICD-10-CM

## 2014-07-03 LAB — CBC WITH DIFFERENTIAL/PLATELET
BASOS PCT: 0 % (ref 0–1)
Basophils Absolute: 0 10*3/uL (ref 0.0–0.1)
Eosinophils Absolute: 0.6 10*3/uL (ref 0.0–0.7)
Eosinophils Relative: 5 % (ref 0–5)
HCT: 31.4 % — ABNORMAL LOW (ref 39.0–52.0)
Hemoglobin: 10.1 g/dL — ABNORMAL LOW (ref 13.0–17.0)
LYMPHS PCT: 11 % — AB (ref 12–46)
Lymphs Abs: 1.3 10*3/uL (ref 0.7–4.0)
MCH: 27.7 pg (ref 26.0–34.0)
MCHC: 32.2 g/dL (ref 30.0–36.0)
MCV: 86.3 fL (ref 78.0–100.0)
Monocytes Absolute: 1.7 10*3/uL — ABNORMAL HIGH (ref 0.1–1.0)
Monocytes Relative: 14 % — ABNORMAL HIGH (ref 3–12)
NEUTROS ABS: 8.7 10*3/uL — AB (ref 1.7–7.7)
NEUTROS PCT: 70 % (ref 43–77)
Platelets: 253 10*3/uL (ref 150–400)
RBC: 3.64 MIL/uL — ABNORMAL LOW (ref 4.22–5.81)
RDW: 13.7 % (ref 11.5–15.5)
WBC: 12.3 10*3/uL — AB (ref 4.0–10.5)

## 2014-07-03 LAB — COMPREHENSIVE METABOLIC PANEL
ALBUMIN: 2.2 g/dL — AB (ref 3.5–5.2)
ALK PHOS: 130 U/L — AB (ref 39–117)
ALT: 36 U/L (ref 0–53)
AST: 44 U/L — ABNORMAL HIGH (ref 0–37)
Anion gap: 18 — ABNORMAL HIGH (ref 5–15)
BILIRUBIN TOTAL: 0.2 mg/dL — AB (ref 0.3–1.2)
BUN: 29 mg/dL — ABNORMAL HIGH (ref 6–23)
CHLORIDE: 92 meq/L — AB (ref 96–112)
CO2: 24 mEq/L (ref 19–32)
Calcium: 7.9 mg/dL — ABNORMAL LOW (ref 8.4–10.5)
Creatinine, Ser: 5.81 mg/dL — ABNORMAL HIGH (ref 0.50–1.35)
GFR calc non Af Amer: 9 mL/min — ABNORMAL LOW (ref >90)
GFR, EST AFRICAN AMERICAN: 11 mL/min — AB (ref >90)
GLUCOSE: 220 mg/dL — AB (ref 70–99)
POTASSIUM: 4 meq/L (ref 3.7–5.3)
Sodium: 134 mEq/L — ABNORMAL LOW (ref 137–147)
TOTAL PROTEIN: 8 g/dL (ref 6.0–8.3)

## 2014-07-03 LAB — BLOOD GAS, ARTERIAL
ACID-BASE DEFICIT: 0.7 mmol/L (ref 0.0–2.0)
Bicarbonate: 23.9 mEq/L (ref 20.0–24.0)
DRAWN BY: 41977
O2 CONTENT: 5 L/min
O2 Saturation: 89 %
PATIENT TEMPERATURE: 98.6
PO2 ART: 62.5 mmHg — AB (ref 80.0–100.0)
TCO2: 25.3 mmol/L (ref 0–100)
pCO2 arterial: 42.9 mmHg (ref 35.0–45.0)
pH, Arterial: 7.366 (ref 7.350–7.450)

## 2014-07-03 LAB — LIPID PANEL
CHOL/HDL RATIO: 3.3 ratio
Cholesterol: 106 mg/dL (ref 0–200)
HDL: 32 mg/dL — AB (ref >39)
LDL CALC: 49 mg/dL (ref 0–99)
TRIGLYCERIDES: 126 mg/dL (ref <150)
VLDL: 25 mg/dL (ref 0–40)

## 2014-07-03 LAB — GLUCOSE, CAPILLARY
GLUCOSE-CAPILLARY: 276 mg/dL — AB (ref 70–99)
Glucose-Capillary: 185 mg/dL — ABNORMAL HIGH (ref 70–99)
Glucose-Capillary: 185 mg/dL — ABNORMAL HIGH (ref 70–99)
Glucose-Capillary: 311 mg/dL — ABNORMAL HIGH (ref 70–99)
Glucose-Capillary: 166 mg/dL — ABNORMAL HIGH (ref 70–99)

## 2014-07-03 LAB — MAGNESIUM: MAGNESIUM: 2.1 mg/dL (ref 1.5–2.5)

## 2014-07-03 SURGERY — ARTERIOVENOUS (AV) FISTULA CREATION
Anesthesia: Monitor Anesthesia Care | Laterality: Right

## 2014-07-03 MED ORDER — PNEUMOCOCCAL VAC POLYVALENT 25 MCG/0.5ML IJ INJ
0.5000 mL | INJECTION | INTRAMUSCULAR | Status: AC
  Administered 2014-07-04: 0.5 mL via INTRAMUSCULAR
  Filled 2014-07-03: qty 0.5

## 2014-07-03 MED ORDER — PIPERACILLIN-TAZOBACTAM IN DEX 2-0.25 GM/50ML IV SOLN
2.2500 g | Freq: Three times a day (TID) | INTRAVENOUS | Status: DC
  Administered 2014-07-03 – 2014-07-05 (×6): 2.25 g via INTRAVENOUS
  Filled 2014-07-03 (×10): qty 50

## 2014-07-03 NOTE — Progress Notes (Signed)
   Daily Progress Note  Respiratory status improved.  Will plan on proceeding with [facility] placement, a R BC AVF on Friday, as long at pulmonary status stable.  Adele Barthel, MD [facility] [place] Office: [phone] Pager: [phone]  07/03/2014, 8:08 AM

## 2014-07-03 NOTE — Progress Notes (Signed)
Patient ID: Blake [name], male    DOB: February 28, 1946, 68 y.o.   MRN: [id number]  S: Wants to know when procedure will be done. Breathing improved.  O:BP 123/62  Pulse 81  Temp(Src) 98.4 F (36.9 C) (Axillary)  Resp 20  Ht 5' 8.9" (1.75 m)  Wt 213 lb 13.5 oz (97 kg)  BMI 31.67 kg/m2  SpO2 98%  Intake/Output Summary (Last 24 hours) at 07/03/14 0803 Last data filed at 07/03/14 0630  Gross per 24 hour  Intake    830 ml  Output   1900 ml  Net  -1070 ml   Intake/Output: I/O last 3 completed shifts: In: 930 [I.V.:250; Other:480; IV Piggyback:200] Out: 1900 [Urine:100; Other:1800]  Intake/Output this shift:    Weight change: -1 lb 12.2 oz (-0.8 kg)  Gen: NAD CVS: RRR, normal heart sounds, no murmurs Resp: mild bibasilar crackles, improved. Nasal canula in place. Abd: soft, +BS Ext: trace edema bilat   Recent Labs Lab 06/28/14 0755 06/28/14 0818 06/28/14 2208 06/29/14 0500 06/30/14 0250 07/01/14 0444 07/02/14 0245 07/03/14 0212  NA 136* 137  --  138 137 137 139 134*  K 5.6* 5.3  --  4.9 4.2 4.9 4.2 4.0  CL 102 112  --  101 96 95* 97 92*  CO2 10*  --   --  18* 22 21 22 24   GLUCOSE 140* 142*  --  107* 105* 162* 91 220*  BUN 92* 94*  --  67* 48* 65* 32* 29*  CREATININE 9.37* 10.20*  --  7.73* 6.55* 8.90* 6.06* 5.81*  ALBUMIN 2.1*  --   --   --  2.0*  --  2.0* 2.2*  CALCIUM 7.7*  --   --  7.7* 7.9* 7.8* 8.2* 7.9*  PHOS  --   --  5.9*  --  7.4*  --   --   --   AST 20  --   --   --   --   --  20 44*  ALT 13  --   --   --   --   --  16 36   Liver Function Tests:  Recent Labs Lab 06/28/14 0755 06/30/14 0250 07/02/14 0245 07/03/14 0212  AST 20  --  20 44*  ALT 13  --  16 36  ALKPHOS 117  --  114 130*  BILITOT 0.3  --  0.3 0.2*  PROT 7.7  --  7.9 8.0  ALBUMIN 2.1* 2.0* 2.0* 2.2*   No results found for this basename: LIPASE, AMYLASE,  in the last 168 hours No results found for this basename: AMMONIA,  in the last 168 hours CBC:  Recent Labs Lab  06/28/14 0755  06/29/14 0500 07/01/14 0444 07/02/14 0245 07/03/14 0212  WBC 15.8*  --  13.2* 13.1* 12.2* 12.3*  NEUTROABS 13.4*  --   --   --   --  8.7*  HGB 9.3*  < > 8.4* 8.7* 9.6* 10.1*  HCT 28.4*  < > 26.1* 27.0* 31.2* 31.4*  MCV 82.6  --  83.7 83.9 86.0 86.3  PLT 394  --  341 312 282 253  < > = values in this interval not displayed. Cardiac Enzymes:  Recent Labs Lab 06/28/14 0755 06/28/14 1000 06/28/14 2208  TROPONINI <0.30 <0.30 <0.30   CBG:  Recent Labs Lab 07/01/14 2158 07/02/14 0826 07/02/14 1145 07/02/14 1702 07/02/14 2154  GLUCAP 94 87 150* 224* 276*    Iron Studies:   Recent Labs  07/02/14 0245  IRON 18*  TIBC 125*  FERRITIN 765*   Studies/Results: Dg Chest Port 1 View  07/02/2014   CLINICAL DATA:  Follow-up will pulmonary edema ; increase dyspnea today with increased congestion ; history of hypertension, diabetes, and chronic renal insufficiency  EXAM: PORTABLE CHEST - 1 VIEW  COMPARISON:  Portable chest x-ray of July 01, 2014  FINDINGS: The lungs are adequately inflated. The interstitial markings in the right lung are slightly more conspicuous today. On the left the interstitium is stable to slightly improved. The cardiopericardial silhouette remains mildly enlarged. The central pulmonary vascularity is slightly less conspicuous today. The mediastinum is normal in width. The right internal jugular venous catheter tip overlies the proximal portion of the SVC.  IMPRESSION: Slightly increased prominence of the pulmonary interstitium on the right may reflect asymmetric pulmonary edema or less likely interstitial pneumonia. The pulmonary interstitium on the left has improved slightly. When the patient can tolerate the procedure, a PA and lateral chest x-ray would be useful.   Electronically Signed   By: David  Martinique   On: 07/02/2014 08:49   Dg Abd Portable 1v  07/01/2014   CLINICAL DATA:  68 year old male on dialysis with diabetes, high blood pressure  and hyperlipidemia presenting with nausea.  EXAM: PORTABLE ABDOMEN - 1 VIEW  COMPARISON:  No comparison plain film examination of the abdomen  FINDINGS: Nonspecific bowel gas pattern without plain film findings of bowel obstruction.  The possibility of free intraperitoneal air cannot be assessed on a supine view.  Vascular calcifications.  IMPRESSION: Nonspecific bowel gas pattern without plain film evidence of bowel obstruction. Please see above.   Electronically Signed   By: Chauncey Cruel M.D.   On: 07/01/2014 12:37   . aspirin EC  81 mg Oral Daily  . atorvastatin  20 mg Oral q1800  . buPROPion  300 mg Oral Daily  . calcitRIOL  0.25 mcg Oral Daily  . cefTRIAXone (ROCEPHIN)  IV  1 g Intravenous Q24H  . darbepoetin (ARANESP) injection - DIALYSIS  60 mcg Intravenous Q Sat-HD  . feeding supplement (NEPRO CARB STEADY)  237 mL Oral BID BM  . heparin  5,000 Units Subcutaneous 3 times per day  . insulin aspart  0-15 Units Subcutaneous TID WC  . insulin aspart  0-5 Units Subcutaneous QHS  . insulin aspart  4 Units Subcutaneous TID WC  . insulin glargine  20 Units Subcutaneous QHS  . metoCLOPramide (REGLAN) injection  5 mg Intravenous 4 times per day  . pantoprazole  40 mg Oral Daily    BMET    Component Value Date/Time   NA 134* 07/03/2014 0212   K 4.0 07/03/2014 0212   CL 92* 07/03/2014 0212   CO2 24 07/03/2014 0212   GLUCOSE 220* 07/03/2014 0212   BUN 29* 07/03/2014 0212   CREATININE 5.81* 07/03/2014 0212   CALCIUM 7.9* 07/03/2014 0212   GFRNONAA 9* 07/03/2014 0212   GFRAA 11* 07/03/2014 0212   CBC    Component Value Date/Time   WBC 12.3* 07/03/2014 0212   RBC 3.64* 07/03/2014 0212   RBC 3.63* 07/02/2014 0245   HGB 10.1* 07/03/2014 0212   HCT 31.4* 07/03/2014 0212   PLT 253 07/03/2014 0212   MCV 86.3 07/03/2014 0212   MCH 27.7 07/03/2014 0212   MCHC 32.2 07/03/2014 0212   RDW 13.7 07/03/2014 0212   LYMPHSABS 1.3 07/03/2014 0212   MONOABS 1.7* 07/03/2014 0212   EOSABS 0.6  07/03/2014 0212   BASOSABS 0.0  07/03/2014 0212     Assessment/Plan: 68 y.o. male with progressive CKD, DM, HTN; admitted for worsening SOB with Scr 10.2 and K of 5.6. HD started in hospital.  1. 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 Plan for HD tomorrow. 2. Vascular access: Temp trialysis catheter. TDC and R AVF planned for Friday by [facility]. 3. Pulmonary- Cont with UF and treatment of PNA. No bipap req, on [place]. 4. SHPTH: Phos 7.4, Ca 7.9, iPTH 535.  On calcitriol, may need to start phosphate binders once taking po 5. ACD: weekly aranesp  6. AG metabolic acidosis: resolved with HD  7. Hyperkalemia: resolved with HD  8. DM: per primary  9. HTN: discontinued amlodipine for better toleration of UF with HD 10. CAP: abx per CCM (vanc, ctx, azithro)  Tawanna Sat  Renal attending.  Dialysis yesterday and 1.8 liters of fluid was removed. He reports improved breathing.  Plan for [facility] and AV access on Friday. Will arrange OP HD Lynnlee Revels C

## 2014-07-03 NOTE — Progress Notes (Signed)
  Echocardiogram 2D Echocardiogram has been performed.  Darlina Sicilian M 07/03/2014, 10:12 AM

## 2014-07-03 NOTE — Progress Notes (Signed)
[place] TEAM 1 - Stepdown/ICU TEAM Progress Note  MERION PETTUS [id number] DOB: [birth year] DOA: 06/28/2014 PCP: [name], MD  Admit HPI / Brief Narrative: 68 y/o M, non-smoker, with PMH of HTN, HLD, DM, and CKD with recent work up by Dr. Oneida Alar for AVF placement and planned start of HD in the near future who presented to [facility] ER on 10/15 with complaints of worsening shortness of breath for a week, or more.  He was seen in office by Dr. Oneida Alar on 10/15 for review of potential AVF placement. Office notes at that time reflect he was 84% on RA. Per notes, he was planned to have a graft placed on 10/28.   On presentation to the ER, he was noted to have significant shortness of breath and saturations of 47%. He was placed on BiPAP for increased work of breathing. CXR was concerning for R sided airspace disease (edema vs infiltrate).   HPI/Subjective: Pt is sleepy, but overall looks better.  He is in no resp distress.  He denies cp, n/v, or abdom pain.    Assessment/Plan:  Acute Hypoxemic Respiratory Failure  - pulm edema + possible CAP  appears to be stabilizing w/ ongoing HD - O2 requirement decreasing - nonetheless, WBC remains elevated - w/ hx of recent nausea/vomiting in setting of confusion will broaden abx to provide better anaerobic coverage   Acute on CKD HD initiated during this admission - temp trialysis catheter - planned tunnel cath and RUE AVF Friday - creatinine in July 2012 was 1.9 - crt 2.17 Oct 2012  Acute Encephalopathy - in the setting of uremia / AKI Appears to be improving steadily - B12 and folate normal   Anemia of chronic kidney disease Fe indices c/w deficiency - epo + Fe per Nephrology   Hyperkalemia resolved w/ HD   DM2 w/ renal complications  Overall trend is of improvement - A1c 6.0  HTN BP well controlled at present   HLD LDL at goal   Obesity - Body mass index is 31.67 kg/(m^2).  Code Status: FULL Family Communication: no family  present at time of exam Disposition Plan: stable for tele bed on renal floor - PT/OT   Consultants: Vasc Surgery  PCCM > [facility]  Nephrology    Procedures: 10/16 - temp HD catheter  Antibiotics: Vanc 10/16 > Ceftrx 10/16 >10/21 Azithro 10/16 > Zosyn 10/21 >  DVT prophylaxis: SQ heparin   Objective: Blood pressure 94/53, pulse 79, temperature 98.5 F (36.9 C), temperature source Oral, resp. rate 17, height 5' 8.9" (1.75 m), weight 97 kg (213 lb 13.5 oz), SpO2 99.00%.  Intake/Output Summary (Last 24 hours) at 07/03/14 1019 Last data filed at 07/03/14 0630  Gross per 24 hour  Intake    540 ml  Output   1900 ml  Net  -1360 ml   Exam: General: No acute respiratory distress at rest on [place]  Lungs: CTA th/o w no wheeze  Cardiovascular: Regular rate and rhythm without murmur gallop or rub  Abdomen: Nontender, nondistended, soft, bowel sounds positive, no rebound, no ascites, no appreciable mass Extremities: No significant cyanosis, clubbing;  trace edema bilateral lower extremities  Data Reviewed: Basic Metabolic Panel:  Recent Labs Lab 06/28/14 0818 06/28/14 2208 06/29/14 0500 06/30/14 0250 07/01/14 0444 07/02/14 0245 07/03/14 0212  NA 137  --  138 137 137 139 134*  K 5.3  --  4.9 4.2 4.9 4.2 4.0  CL 112  --  101 96 95* 97 92*  CO2  --   --  18* 22 21 22 24   GLUCOSE 142*  --  107* 105* 162* 91 220*  BUN 94*  --  67* 48* 65* 32* 29*  CREATININE 10.20*  --  7.73* 6.55* 8.90* 6.06* 5.81*  CALCIUM  --   --  7.7* 7.9* 7.8* 8.2* 7.9*  MG  --   --   --   --   --   --  2.1  PHOS  --  5.9*  --  7.4*  --   --   --     Liver Function Tests:  Recent Labs Lab 06/28/14 0755 06/30/14 0250 07/02/14 0245 07/03/14 0212  AST 20  --  20 44*  ALT 13  --  16 36  ALKPHOS 117  --  114 130*  BILITOT 0.3  --  0.3 0.2*  PROT 7.7  --  7.9 8.0  ALBUMIN 2.1* 2.0* 2.0* 2.2*    Coags:  Recent Labs Lab 07/02/14 0245  INR 1.33    Recent Labs Lab 07/02/14 0245  APTT 34     CBC:  Recent Labs Lab 06/28/14 0755 06/28/14 0818 06/29/14 0500 07/01/14 0444 07/02/14 0245 07/03/14 0212  WBC 15.8*  --  13.2* 13.1* 12.2* 12.3*  NEUTROABS 13.4*  --   --   --   --  8.7*  HGB 9.3* 10.9* 8.4* 8.7* 9.6* 10.1*  HCT 28.4* 32.0* 26.1* 27.0* 31.2* 31.4*  MCV 82.6  --  83.7 83.9 86.0 86.3  PLT 394  --  341 312 282 253    Cardiac Enzymes:  Recent Labs Lab 06/28/14 0755 06/28/14 1000 06/28/14 2208  TROPONINI <0.30 <0.30 <0.30   BNP (last 3 results)  Recent Labs  06/28/14 0755  PROBNP 1556.0*    CBG:  Recent Labs Lab 07/02/14 0826 07/02/14 1145 07/02/14 1702 07/02/14 2154 07/03/14 0824  GLUCAP 87 150* 224* 276* 185*    Recent Results (from the past 240 hour(s))  CULTURE, BLOOD (ROUTINE X 2)     Status: None   Collection Time    06/28/14  9:45 AM      Result Value Ref Range Status   Specimen Description BLOOD LEFT HAND   Final   Special Requests BOTTLES DRAWN AEROBIC AND ANAEROBIC 10ML   Final   Culture  Setup Time     Final   Value: 06/28/2014 17:15     Performed at [facility]   Culture     Final   Value:        BLOOD CULTURE RECEIVED NO GROWTH TO DATE CULTURE WILL BE HELD FOR 5 DAYS BEFORE ISSUING A FINAL NEGATIVE REPORT     Performed at [facility]   Report Status PENDING   Incomplete  CULTURE, BLOOD (ROUTINE X 2)     Status: None   Collection Time    06/28/14 10:00 AM      Result Value Ref Range Status   Specimen Description BLOOD LEFT ARM   Final   Special Requests BOTTLES DRAWN AEROBIC AND ANAEROBIC 10ML   Final   Culture  Setup Time     Final   Value: 06/28/2014 17:14     Performed at [facility]   Culture     Final   Value:        BLOOD CULTURE RECEIVED NO GROWTH TO DATE CULTURE WILL BE HELD FOR 5 DAYS BEFORE ISSUING A FINAL NEGATIVE REPORT     Performed at [facility]   Report Status PENDING  Incomplete  MRSA PCR SCREENING     Status: None   Collection Time    06/28/14  1:57 PM       Result Value Ref Range Status   MRSA by PCR NEGATIVE  NEGATIVE Final   Comment:            The GeneXpert MRSA Assay (FDA     approved for NASAL specimens     only), is one component of a     comprehensive MRSA colonization     surveillance program. It is not     intended to diagnose MRSA     infection nor to guide or     monitor treatment for     MRSA infections.  URINE CULTURE     Status: None   Collection Time    06/28/14  2:17 PM      Result Value Ref Range Status   Specimen Description URINE, CLEAN CATCH   Final   Special Requests NONE   Final   Culture  Setup Time     Final   Value: 06/28/2014 21:00     Performed at [facility]     Final   Value: NO GROWTH     Performed at [facility]   Culture     Final   Value: NO GROWTH     Performed at [facility]   Report Status 06/29/2014 FINAL   Final     Studies:  Recent x-ray studies have been reviewed in detail by the Attending Physician  Scheduled Meds:  Scheduled Meds: . aspirin EC  81 mg Oral Daily  . atorvastatin  20 mg Oral q1800  . buPROPion  300 mg Oral Daily  . calcitRIOL  0.25 mcg Oral Daily  . cefTRIAXone (ROCEPHIN)  IV  1 g Intravenous Q24H  . darbepoetin (ARANESP) injection - DIALYSIS  60 mcg Intravenous Q Sat-HD  . feeding supplement (NEPRO CARB STEADY)  237 mL Oral BID BM  . heparin  5,000 Units Subcutaneous 3 times per day  . insulin aspart  0-15 Units Subcutaneous TID WC  . insulin aspart  0-5 Units Subcutaneous QHS  . insulin aspart  4 Units Subcutaneous TID WC  . insulin glargine  20 Units Subcutaneous QHS  . metoCLOPramide (REGLAN) injection  5 mg Intravenous 4 times per day  . pantoprazole  40 mg Oral Daily    Time spent on care of this patient: 35 mins   Talesha Ellithorpe T , MD   [facility] Office  [phone] Pager - Text Page per Shea Evans as per below:  On-Call/Text Page:      [email or web address]      password [email or web address]  If 7PM-7AM, please contact  night-coverage [email or web address] Password [email or web address] 07/03/2014, 10:19 AM   LOS: 5 days

## 2014-07-03 NOTE — Progress Notes (Signed)
ANTIBIOTIC CONSULT NOTE - FOLLOW UP  Pharmacy Consult for Vancomycin and Zosyn Indication: rule out pneumonia  No Known Allergies  Patient Measurements: Height: 5' 8.9" (175 cm) Weight: 213 lb 13.5 oz (97 kg) IBW/kg (Calculated) : 70.47  Vital Signs: Temp: 98.5 F (36.9 C) (10/21 0828) Temp Source: Oral (10/21 0828) BP: 94/53 mmHg (10/21 0800) Pulse Rate: 79 (10/21 0800) Intake/Output from previous day: 10/20 0701 - 10/21 0700 In: 830 [I.V.:150; IV Piggyback:200] Out: 1900 [Urine:100] Intake/Output from this shift:    Labs:  Recent Labs  07/01/14 0444 07/02/14 0245 07/03/14 0212  WBC 13.1* 12.2* 12.3*  HGB 8.7* 9.6* 10.1*  PLT 312 282 253  CREATININE 8.90* 6.06* 5.81*   Estimated Creatinine Clearance: 14.3 ml/min (by C-G formula based on Cr of 5.81). No results found for this basename: VANCOTROUGH, Corlis Leak, VANCORANDOM, GENTTROUGH, GENTPEAK, GENTRANDOM, TOBRATROUGH, TOBRAPEAK, TOBRARND, AMIKACINPEAK, AMIKACINTROU, AMIKACIN,  in the last 72 hours   Microbiology: Recent Results (from the past 720 hour(s))  CULTURE, BLOOD (ROUTINE X 2)     Status: None   Collection Time    06/28/14  9:45 AM      Result Value Ref Range Status   Specimen Description BLOOD LEFT HAND   Final   Special Requests BOTTLES DRAWN AEROBIC AND ANAEROBIC 10ML   Final   Culture  Setup Time     Final   Value: 06/28/2014 17:15     Performed at [facility]   Culture     Final   Value:        BLOOD CULTURE RECEIVED NO GROWTH TO DATE CULTURE WILL BE HELD FOR 5 DAYS BEFORE ISSUING A FINAL NEGATIVE REPORT     Performed at [facility]   Report Status PENDING   Incomplete  CULTURE, BLOOD (ROUTINE X 2)     Status: None   Collection Time    06/28/14 10:00 AM      Result Value Ref Range Status   Specimen Description BLOOD LEFT ARM   Final   Special Requests BOTTLES DRAWN AEROBIC AND ANAEROBIC 10ML   Final   Culture  Setup Time     Final   Value: 06/28/2014 17:14     Performed at  [facility]   Culture     Final   Value:        BLOOD CULTURE RECEIVED NO GROWTH TO DATE CULTURE WILL BE HELD FOR 5 DAYS BEFORE ISSUING A FINAL NEGATIVE REPORT     Performed at [facility]   Report Status PENDING   Incomplete  MRSA PCR SCREENING     Status: None   Collection Time    06/28/14  1:57 PM      Result Value Ref Range Status   MRSA by PCR NEGATIVE  NEGATIVE Final   Comment:            The GeneXpert MRSA Assay (FDA     approved for NASAL specimens     only), is one component of a     comprehensive MRSA colonization     surveillance program. It is not     intended to diagnose MRSA     infection nor to guide or     monitor treatment for     MRSA infections.  URINE CULTURE     Status: None   Collection Time    06/28/14  2:17 PM      Result Value Ref Range Status   Specimen Description URINE, CLEAN  CATCH   Final   Special Requests NONE   Final   Culture  Setup Time     Final   Value: 06/28/2014 21:00     Performed at [place]     Final   Value: NO GROWTH     Performed at [facility]   Culture     Final   Value: NO GROWTH     Performed at [facility]   Report Status 06/29/2014 FINAL   Final    Anti-infectives   Start     Dose/Rate Route Frequency Ordered Stop   07/03/14 1100  piperacillin-tazobactam (ZOSYN) IVPB 2.25 g     2.25 g 100 mL/hr over 30 Minutes Intravenous 3 times per day 07/03/14 1047     07/02/14 1915  vancomycin (VANCOCIN) IVPB 750 mg/150 ml premix     750 mg 150 mL/hr over 60 Minutes Intravenous  Once 07/02/14 1855 07/02/14 2015   07/01/14 1500  vancomycin (VANCOCIN) IVPB 1000 mg/200 mL premix     1,000 mg 200 mL/hr over 60 Minutes Intravenous  Once 07/01/14 1419 07/01/14 1614   07/01/14 0600  ceFAZolin (ANCEF) IVPB 1 g/50 mL premix  Status:  Discontinued     1 g 100 mL/hr over 30 Minutes Intravenous On call to O.R. 06/28/14 1303 06/28/14 1323   07/01/14 0600  ceFAZolin (ANCEF) IVPB 2  g/50 mL premix  Status:  Discontinued     2 g 100 mL/hr over 30 Minutes Intravenous 60 min pre-op 06/28/14 1324 06/30/14 1121   07/01/14 0600  cefUROXime (ZINACEF) 1.5 g in dextrose 5 % 50 mL IVPB  Status:  Discontinued     1.5 g 100 mL/hr over 30 Minutes Intravenous 30 min pre-op 06/30/14 1122 06/30/14 1237   06/30/14 1130  vancomycin (VANCOCIN) 500 mg in sodium chloride 0.9 % 100 mL IVPB     500 mg 100 mL/hr over 60 Minutes Intravenous  Once 06/30/14 1120 06/30/14 1302   06/29/14 1000  azithromycin (ZITHROMAX) 500 mg in dextrose 5 % 250 mL IVPB  Status:  Discontinued     500 mg 250 mL/hr over 60 Minutes Intravenous Every 24 hours 06/28/14 1137 06/30/14 1237   06/29/14 1000  cefTRIAXone (ROCEPHIN) 1 g in dextrose 5 % 50 mL IVPB  Status:  Discontinued     1 g 100 mL/hr over 30 Minutes Intravenous Every 24 hours 06/28/14 1137 07/03/14 1032   06/28/14 1330  vancomycin (VANCOCIN) 500 mg in sodium chloride 0.9 % 100 mL IVPB  Status:  Discontinued     500 mg 100 mL/hr over 60 Minutes Intravenous Every Fri (Hemodialysis) 06/28/14 1320 06/29/14 1410   06/28/14 0930  vancomycin (VANCOCIN) 2,000 mg in sodium chloride 0.9 % 500 mL IVPB     2,000 mg 250 mL/hr over 120 Minutes Intravenous  Once 06/28/14 0846 06/28/14 1258   06/28/14 0815  cefTRIAXone (ROCEPHIN) 1 g in dextrose 5 % 50 mL IVPB     1 g 100 mL/hr over 30 Minutes Intravenous  Once 06/28/14 0814 06/28/14 0859   06/28/14 0815  azithromycin (ZITHROMAX) 500 mg in dextrose 5 % 250 mL IVPB     500 mg 250 mL/hr over 60 Minutes Intravenous  Once 06/28/14 0814 06/28/14 1003      Assessment: 67 YOM on Vancomycin per Rx + Rocephin for pneumonia (abx Day #6). Due to recent nausea and vomiting, MD would like to broaden antibiotics for better anaerobic coverage. Urine  and blood cultures 10/16 NGTD. Patient is newly starting hemodialysis, no clear HD schedule yet. Last HD sessions 10/20. Afebrile, WBC 12.3 (stable). SCr 5.81, CrCl ~14 ml/min  Vanc  10/16 >> CTX 10/16 >> 10/21 Azithro 10/16 >>10/18 Zosyn 10/21 >>  Goal of Therapy:  Pre-HD Vancomycin level of 15-25 mcg/ml Clinical resolution of infection  Plan:  -Discontinue ceftriaxone -Initiate Zosyn 2.25 g IV q8h - HD dosing -Dose vancomycin based on HD schedule -Follow up long term HD schedule, LOT, and antibiotic de-escalation plans  Whitney Muse, PharmD Candidate 07/03/2014  10:53 AM  I agree with above assessment and plan.  Sherlon Handing, PharmD, BCPS Clinical pharmacist, pager [phone] 07/03/2014  1:55 PM

## 2014-07-04 DIAGNOSIS — N179 Acute kidney failure, unspecified: Secondary | ICD-10-CM | POA: Diagnosis not present

## 2014-07-04 DIAGNOSIS — N172 Acute kidney failure with medullary necrosis: Secondary | ICD-10-CM

## 2014-07-04 LAB — CULTURE, BLOOD (ROUTINE X 2)
CULTURE: NO GROWTH
Culture: NO GROWTH

## 2014-07-04 LAB — CLOSTRIDIUM DIFFICILE BY PCR: Toxigenic C. Difficile by PCR: NEGATIVE

## 2014-07-04 LAB — GLUCOSE, CAPILLARY
GLUCOSE-CAPILLARY: 113 mg/dL — AB (ref 70–99)
Glucose-Capillary: 212 mg/dL — ABNORMAL HIGH (ref 70–99)
Glucose-Capillary: 192 mg/dL — ABNORMAL HIGH (ref 70–99)
Glucose-Capillary: 169 mg/dL — ABNORMAL HIGH (ref 70–99)
Glucose-Capillary: 226 mg/dL — ABNORMAL HIGH (ref 70–99)

## 2014-07-04 NOTE — Progress Notes (Signed)
OT Cancellation Note  Patient Details Name: Blake [name] MRN: [id number] DOB: May 02, 1946   Cancelled Treatment:    Reason Eval/Treat Not Completed: Patient at procedure or test/ unavailable.  Patient off the floor to dialysis.  OT will follow up.  [place], [place] 07/04/2014, 4:04 PM

## 2014-07-04 NOTE — Progress Notes (Signed)
PT Cancellation Note  Patient Details Name: Blake [name] MRN: [id number] DOB: May 22, 1946   Cancelled Treatment:    Reason Eval/Treat Not Completed: Patient at procedure or test/unavailable Pt off floor at dialysis. Will follow up next available time.    Candy Sledge A 07/04/2014, 3:42 PM Candy Sledge, [place], DPT [phone]

## 2014-07-04 NOTE — Progress Notes (Signed)
TEAM 1 Transfer Progress Note  Blake [name] [id number] DOB: [birth year] DOA: 06/28/2014 PCP: [name], MD  Admit HPI / Brief Narrative: 68 y/o M, non-smoker, with PMH of HTN, HLD, DM, and CKD with recent work up by Dr. Oneida Alar for AVF placement and planned start of HD in the near future who presented to [facility] ER on 10/15 with complaints of worsening shortness of breath for a week, or more.  He was seen in office by Dr. Oneida Alar on 10/15 for review of potential AVF placement. Office notes at that time reflect he was 84% on RA. Per notes, he was planned to have a graft placed on 10/28.   On presentation to the ER, he was noted to have significant shortness of breath and saturations of 47%. He was placed on BiPAP for increased work of breathing. CXR was concerning for R sided airspace disease (edema vs infiltrate).   HPI/Subjective: Pt is sleepy, but overall feels better.    Assessment/Plan:  Acute Hypoxemic Respiratory Failure  - pulm edema + possible CAP  -appears to be stabilizing w/ ongoing HD - O2 requirement decreasing - nonetheless, WBC remains elevated -Wean O2 -Doubt pneumonia, stop Vancomycin -Stop Zosyn tomorrow and change to PO levaquin depending on clinical status  Acute on CKD HD initiated during this admission - temp trialysis catheter - planned tunnel cath and RUE AVF Friday - creatinine in July 2012 was 1.9 - crt 2.17 Oct 2012  Acute Encephalopathy - in the setting of uremia / AKI Appears to be improving steadily - B12 and folate normal   Anemia of chronic kidney disease Fe indices c/w deficiency - epo + Fe per Nephrology   Hyperkalemia resolved w/ HD   DM2 w/ renal complications  Overall trend is of improvement - A1c 6.0  HTN BP well controlled at present   HLD LDL at goal   Obesity - Body mass index is 33.43 kg/(m^2).  Code Status: FULL Family Communication: no family present at time of exam Disposition Plan: home pending HD  access and CLIP for HD   Consultants: Vasc Surgery  PCCM > [facility]  Nephrology    Procedures: 10/16 - temp HD catheter  Antibiotics: Vanc 10/16 > Ceftrx 10/16 >10/21 Azithro 10/16 > Zosyn 10/21 >  DVT prophylaxis: SQ heparin   Objective: Blood pressure 127/71, pulse 82, temperature 98.1 F (36.7 C), temperature source Oral, resp. rate 15, height 5\' 8"  (1.727 m), weight 99.7 kg (219 lb 12.8 oz), SpO2 91.00%.  Intake/Output Summary (Last 24 hours) at 07/04/14 1614 Last data filed at 07/04/14 0900  Gross per 24 hour  Intake    460 ml  Output    150 ml  Net    310 ml   Exam: General: AAOx3, no distress Lungs: CTA th/o w no wheeze  Cardiovascular: Regular rate and rhythm without murmur gallop or rub  Abdomen: Nontender, nondistended, soft, bowel sounds positive, no rebound, no ascites, no appreciable mass Extremities: No significant cyanosis, clubbing;  trace edema bilateral lower extremities  Data Reviewed: Basic Metabolic Panel:  Recent Labs Lab 06/28/14 0818 06/28/14 2208 06/29/14 0500 06/30/14 0250 07/01/14 0444 07/02/14 0245 07/03/14 0212  NA 137  --  138 137 137 139 134*  K 5.3  --  4.9 4.2 4.9 4.2 4.0  CL 112  --  101 96 95* 97 92*  CO2  --   --  18* 22 21 22 24   GLUCOSE 142*  --  107* 105* 162* 91 220*  BUN 94*  --  67* 48* 65* 32* 29*  CREATININE 10.20*  --  7.73* 6.55* 8.90* 6.06* 5.81*  CALCIUM  --   --  7.7* 7.9* 7.8* 8.2* 7.9*  MG  --   --   --   --   --   --  2.1  PHOS  --  5.9*  --  7.4*  --   --   --     Liver Function Tests:  Recent Labs Lab 06/28/14 0755 06/30/14 0250 07/02/14 0245 07/03/14 0212  AST 20  --  20 44*  ALT 13  --  16 36  ALKPHOS 117  --  114 130*  BILITOT 0.3  --  0.3 0.2*  PROT 7.7  --  7.9 8.0  ALBUMIN 2.1* 2.0* 2.0* 2.2*    Coags:  Recent Labs Lab 07/02/14 0245  INR 1.33    Recent Labs Lab 07/02/14 0245  APTT 34    CBC:  Recent Labs Lab 06/28/14 0755 06/28/14 0818 06/29/14 0500 07/01/14 0444  07/02/14 0245 07/03/14 0212  WBC 15.8*  --  13.2* 13.1* 12.2* 12.3*  NEUTROABS 13.4*  --   --   --   --  8.7*  HGB 9.3* 10.9* 8.4* 8.7* 9.6* 10.1*  HCT 28.4* 32.0* 26.1* 27.0* 31.2* 31.4*  MCV 82.6  --  83.7 83.9 86.0 86.3  PLT 394  --  341 312 282 253    Cardiac Enzymes:  Recent Labs Lab 06/28/14 0755 06/28/14 1000 06/28/14 2208  TROPONINI <0.30 <0.30 <0.30   BNP (last 3 results)  Recent Labs  06/28/14 0755  PROBNP 1556.0*    CBG:  Recent Labs Lab 07/03/14 1700 07/03/14 2031 07/04/14 0803 07/04/14 1144 07/04/14 1601  GLUCAP 311* 166* 212* 192* 169*    Recent Results (from the past 240 hour(s))  CULTURE, BLOOD (ROUTINE X 2)     Status: None   Collection Time    06/28/14  9:45 AM      Result Value Ref Range Status   Specimen Description BLOOD LEFT HAND   Final   Special Requests BOTTLES DRAWN AEROBIC AND ANAEROBIC 10ML   Final   Culture  Setup Time     Final   Value: 06/28/2014 17:15     Performed at [facility]   Culture     Final   Value: NO GROWTH 5 DAYS     Performed at [facility]   Report Status 07/04/2014 FINAL   Final  CULTURE, BLOOD (ROUTINE X 2)     Status: None   Collection Time    06/28/14 10:00 AM      Result Value Ref Range Status   Specimen Description BLOOD LEFT ARM   Final   Special Requests BOTTLES DRAWN AEROBIC AND ANAEROBIC 10ML   Final   Culture  Setup Time     Final   Value: 06/28/2014 17:14     Performed at [facility]   Culture     Final   Value: NO GROWTH 5 DAYS     Performed at [facility]   Report Status 07/04/2014 FINAL   Final  MRSA PCR SCREENING     Status: None   Collection Time    06/28/14  1:57 PM      Result Value Ref Range Status   MRSA by PCR NEGATIVE  NEGATIVE Final   Comment:            The GeneXpert MRSA Assay (  FDA     approved for NASAL specimens     only), is one component of a     comprehensive MRSA colonization     surveillance program. It is not     intended to  diagnose MRSA     infection nor to guide or     monitor treatment for     MRSA infections.  URINE CULTURE     Status: None   Collection Time    06/28/14  2:17 PM      Result Value Ref Range Status   Specimen Description URINE, CLEAN CATCH   Final   Special Requests NONE   Final   Culture  Setup Time     Final   Value: 06/28/2014 21:00     Performed at [facility] Count     Final   Value: NO GROWTH     Performed at [facility]   Culture     Final   Value: NO GROWTH     Performed at [facility]   Report Status 06/29/2014 FINAL   Final     Studies:  Recent x-ray studies have been reviewed in detail by the Attending Physician  Scheduled Meds:  Scheduled Meds: . aspirin EC  81 mg Oral Daily  . atorvastatin  20 mg Oral q1800  . buPROPion  300 mg Oral Daily  . calcitRIOL  0.25 mcg Oral Daily  . darbepoetin (ARANESP) injection - DIALYSIS  60 mcg Intravenous Q Sat-HD  . feeding supplement (NEPRO CARB STEADY)  237 mL Oral BID BM  . heparin  5,000 Units Subcutaneous 3 times per day  . insulin aspart  0-15 Units Subcutaneous TID WC  . insulin aspart  0-5 Units Subcutaneous QHS  . insulin aspart  4 Units Subcutaneous TID WC  . insulin glargine  20 Units Subcutaneous QHS  . metoCLOPramide (REGLAN) injection  5 mg Intravenous 4 times per day  . pantoprazole  40 mg Oral Daily  . piperacillin-tazobactam (ZOSYN)  IV  2.25 g Intravenous 3 times per day    Time spent on care of this patient: 24 mins   Domenic Polite , MD   [facility] Office  [phone] Pager - Text Page per Shea Evans as per below:  On-Call/Text Page:      [email or web address]      password [email or web address]  If 7PM-7AM, please contact night-coverage [email or web address] Password [email or web address] 07/04/2014, 4:14 PM   LOS: 6 days

## 2014-07-04 NOTE — Plan of Care (Signed)
Problem: Food- and Nutrition-Related Knowledge Deficit (NB-1.1) Goal: Nutrition education Formal process to instruct or train a patient/client in a skill or to impart knowledge to help patients/clients voluntarily manage or modify food choices and eating behavior to maintain or improve health. Outcome: Completed/Met Date Met:  07/04/14 Nutrition Education Note  RD consulted for Renal Education. Provided Choose-A-Meal Booklet and "Healthy Eating with Kidney Disease" handout to patient. Reviewed food groups and provided written recommended serving sizes specifically determined for patient's current nutritional status.   Explained why diet restrictions are needed and provided lists of foods to limit/avoid that are high potassium, sodium, and phosphorus. Provided specific recommendations on safer alternatives of these foods. Discussed renal friendly drink options. Strongly encouraged compliance of this diet.   Discussed importance of protein intake at each meal and snack. Provided examples of how to maximize protein intake throughout the day. Discussed need for fluid restriction with dialysis.  Encouraged pt to discuss specific diet questions/concerns with RD at HD outpatient facility. Teach back method used.  Expect fair compliance.  Body mass index is 33.43 kg/(m^2). Pt meets criteria for class I obesity based on current BMI.  Kallie Locks, MS, RD, LDN Pager # [phone] After hours/ weekend pager # [phone]

## 2014-07-04 NOTE — Progress Notes (Signed)
NUTRITION FOLLOW UP  DOCUMENTATION CODES Per approved criteria  -Obesity Unspecified   INTERVENTION:  Continue Nepro Shake PO BID, each supplement provides 425 kcal and 19 grams protein  NUTRITION DIAGNOSIS: Inadequate oral intake related to inability to eat as evidenced by NPO status; Resolved.  NEW NUTRITION Dx: Increased nutrient needs related to chronic illness as evidenced by estimated nutrition needs.  Goal: Intake to meet >90% of estimated nutrition needs.  Monitor:  PO intake, labs, weight trend, I/O's  68 y.o. male  Admitting Dx: Respiratory distress, renal failure  ASSESSMENT: 68 y/o M with PMH of CKD and recent work up for AVF placement who presented to [facility] ER on 10/16 with increased SOB, fatigue and hypoxemia. Found to have a metabolic acidosis, significant worsening of renal failure and respiratory distress.  10/19-Nutrition focused physical exam completed.  No muscle or subcutaneous fat depletion noticed. Patient with multiple areas of loose skin and stretch marks, suspect related to weight loss. Patient's RN reports that he was eating yesterday, but has not eaten today due to respiratory distress. Currently on BiPAP.  10/22- Meal completion is 55%. Pt reports his appetite is good. Pt reports he does not like his renal diet. Pt has been drinking his Nepro and reports liking it. Pt was encouraged to eat his food at meals and to drink his Nepro Shakes.  Height: Ht Readings from Last 1 Encounters:  07/03/14 5\' 8"  (1.727 m)    Weight: Wt Readings from Last 1 Encounters:  07/03/14 219 lb 12.8 oz (99.7 kg)   BMI:  Body mass index is 33.43 kg/(m^2). class 1 obesity  Re-Estimated Nutritional Needs: Kcal: 2200-2400 Protein: 100-130 gm Fluid: 1.2 L  Skin: WDL  Diet Order: Diabetic (Renal, CHO-modified)   Intake/Output Summary (Last 24 hours) at 07/04/14 1445 Last data filed at 07/04/14 0900  Gross per 24 hour  Intake    460 ml  Output    150 ml  Net     310 ml    Last BM: 10/21  Labs:   Recent Labs Lab 06/28/14 0818 06/28/14 2208  06/30/14 0250 07/01/14 0444 07/02/14 0245 07/03/14 0212  NA 137  --   < > 137 137 139 134*  K 5.3  --   < > 4.2 4.9 4.2 4.0  CL 112  --   < > 96 95* 97 92*  CO2  --   --   < > 22 21 22 24   BUN 94*  --   < > 48* 65* 32* 29*  CREATININE 10.20*  --   < > 6.55* 8.90* 6.06* 5.81*  CALCIUM  --   --   < > 7.9* 7.8* 8.2* 7.9*  MG  --   --   --   --   --   --  2.1  PHOS  --  5.9*  --  7.4*  --   --   --   GLUCOSE 142*  --   < > 105* 162* 91 220*  < > = values in this interval not displayed.  CBG (last 3)   Recent Labs  07/03/14 2031 07/04/14 0803 07/04/14 1144  GLUCAP 166* 212* 192*    Scheduled Meds: . aspirin EC  81 mg Oral Daily  . atorvastatin  20 mg Oral q1800  . buPROPion  300 mg Oral Daily  . calcitRIOL  0.25 mcg Oral Daily  . darbepoetin (ARANESP) injection - DIALYSIS  60 mcg Intravenous Q Sat-HD  . feeding supplement (NEPRO  CARB STEADY)  237 mL Oral BID BM  . heparin  5,000 Units Subcutaneous 3 times per day  . insulin aspart  0-15 Units Subcutaneous TID WC  . insulin aspart  0-5 Units Subcutaneous QHS  . insulin aspart  4 Units Subcutaneous TID WC  . insulin glargine  20 Units Subcutaneous QHS  . metoCLOPramide (REGLAN) injection  5 mg Intravenous 4 times per day  . pantoprazole  40 mg Oral Daily  . piperacillin-tazobactam (ZOSYN)  IV  2.25 g Intravenous 3 times per day    Continuous Infusions: . sodium chloride Stopped (07/03/14 0630)    Past Medical History  Diagnosis Date  . Diabetes mellitus without complication   . Hypertension   . Hyperlipidemia   . Renal disorder     Past Surgical History  Procedure Laterality Date  . Back surgery      Kallie Locks, MS, RD, LDN Pager # [phone] After hours/ weekend pager # [phone]

## 2014-07-04 NOTE — Progress Notes (Addendum)
  Progress Note    07/04/2014 9:07 AM Hospital Day 6  Subjective:  No complaints-ready to go home  Afebrile VSS 96% 2LO2NC  Filed Vitals:   07/04/14 0450  BP: 123/55  Pulse: 87  Temp: 98.1 F (36.7 C)  Resp: 19      CBC    Component Value Date/Time   WBC 12.3* 07/03/2014 0212   RBC 3.64* 07/03/2014 0212   RBC 3.63* 07/02/2014 0245   HGB 10.1* 07/03/2014 0212   HCT 31.4* 07/03/2014 0212   PLT 253 07/03/2014 0212   MCV 86.3 07/03/2014 0212   MCH 27.7 07/03/2014 0212   MCHC 32.2 07/03/2014 0212   RDW 13.7 07/03/2014 0212   LYMPHSABS 1.3 07/03/2014 0212   MONOABS 1.7* 07/03/2014 0212   EOSABS 0.6 07/03/2014 0212   BASOSABS 0.0 07/03/2014 0212    BMET    Component Value Date/Time   NA 134* 07/03/2014 0212   K 4.0 07/03/2014 0212   CL 92* 07/03/2014 0212   CO2 24 07/03/2014 0212   GLUCOSE 220* 07/03/2014 0212   BUN 29* 07/03/2014 0212   CREATININE 5.81* 07/03/2014 0212   CALCIUM 7.9* 07/03/2014 0212   GFRNONAA 9* 07/03/2014 0212   GFRAA 11* 07/03/2014 0212    INR    Component Value Date/Time   INR 1.33 07/02/2014 0245     Intake/Output Summary (Last 24 hours) at 07/04/14 0907 Last data filed at 07/04/14 0600  Gross per 24 hour  Intake    220 ml  Output      0 ml  Net    220 ml     Assessment/Plan:  67 y.o. male is ESRD in need of permanent HD access and diatek catheter placement Hospital Day 6  -pt's breathing has improved and he is 96% 2LO2NC -will plan to proceed with surgery tomorrow for diatek catheter placement and right brachiocephalic AVF.   Leontine Locket, PA-C [facility] [phone] 07/04/2014 9:07 AM  Addendum  Pt is scheduled for tomorrow: [facility] placement, R arm fistula placement.  Adele Barthel, MD [facility] [place] Office: [phone] Pager: [phone]  07/04/2014, 9:24 AM

## 2014-07-04 NOTE — Progress Notes (Signed)
Patient ID: Blake [name], male    DOB: [birth year], 68 y.o.   MRN: [id number]  S: Doing well, no complaints. Breathing is good.  O:BP 123/55  Pulse 87  Temp(Src) 98.1 F (36.7 C) (Oral)  Resp 19  Ht 5\' 8"  (1.727 m)  Wt 219 lb 12.8 oz (99.7 kg)  BMI 33.43 kg/m2  SpO2 96%  Intake/Output Summary (Last 24 hours) at 07/04/14 0809 Last data filed at 07/04/14 0600  Gross per 24 hour  Intake    220 ml  Output      0 ml  Net    220 ml   Intake/Output: I/O last 3 completed shifts: In: 84 [P.O.:120; I.V.:115; IV Piggyback:250] Out: 100 [Urine:100]  Intake/Output this shift:    Weight change: 7.1 oz (0.2 kg)  Gen: NAD CVS: RRR, normal heart sounds, no murmurs Resp: mild bibasilar crackles, improved. Nasal canula in place. Abd: soft, +BS Ext: trace edema bilat   Recent Labs Lab 06/28/14 0755 06/28/14 0818 06/28/14 2208 06/29/14 0500 06/30/14 0250 07/01/14 0444 07/02/14 0245 07/03/14 0212  NA 136* 137  --  138 137 137 139 134*  K 5.6* 5.3  --  4.9 4.2 4.9 4.2 4.0  CL 102 112  --  101 96 95* 97 92*  CO2 10*  --   --  18* 22 21 22 24   GLUCOSE 140* 142*  --  107* 105* 162* 91 220*  BUN 92* 94*  --  67* 48* 65* 32* 29*  CREATININE 9.37* 10.20*  --  7.73* 6.55* 8.90* 6.06* 5.81*  ALBUMIN 2.1*  --   --   --  2.0*  --  2.0* 2.2*  CALCIUM 7.7*  --   --  7.7* 7.9* 7.8* 8.2* 7.9*  PHOS  --   --  5.9*  --  7.4*  --   --   --   AST 20  --   --   --   --   --  20 44*  ALT 13  --   --   --   --   --  16 36   Liver Function Tests:  Recent Labs Lab 06/28/14 0755 06/30/14 0250 07/02/14 0245 07/03/14 0212  AST 20  --  20 44*  ALT 13  --  16 36  ALKPHOS 117  --  114 130*  BILITOT 0.3  --  0.3 0.2*  PROT 7.7  --  7.9 8.0  ALBUMIN 2.1* 2.0* 2.0* 2.2*   No results found for this basename: LIPASE, AMYLASE,  in the last 168 hours No results found for this basename: AMMONIA,  in the last 168 hours CBC:  Recent Labs Lab 06/28/14 0755  06/29/14 0500 07/01/14 0444  07/02/14 0245 07/03/14 0212  WBC 15.8*  --  13.2* 13.1* 12.2* 12.3*  NEUTROABS 13.4*  --   --   --   --  8.7*  HGB 9.3*  < > 8.4* 8.7* 9.6* 10.1*  HCT 28.4*  < > 26.1* 27.0* 31.2* 31.4*  MCV 82.6  --  83.7 83.9 86.0 86.3  PLT 394  --  341 312 282 253  < > = values in this interval not displayed. Cardiac Enzymes:  Recent Labs Lab 06/28/14 0755 06/28/14 1000 06/28/14 2208  TROPONINI <0.30 <0.30 <0.30   CBG:  Recent Labs Lab 07/03/14 0824 07/03/14 1235 07/03/14 1700 07/03/14 2031 07/04/14 0803  GLUCAP 185* 185* 311* 166* 212*    Iron Studies:   Recent Labs  07/02/14  0245  IRON 18*  TIBC 125*  FERRITIN 765*   Studies/Results: Dg Chest Port 1 View  07/02/2014   CLINICAL DATA:  Follow-up will pulmonary edema ; increase dyspnea today with increased congestion ; history of hypertension, diabetes, and chronic renal insufficiency  EXAM: PORTABLE CHEST - 1 VIEW  COMPARISON:  Portable chest x-ray of July 01, 2014  FINDINGS: The lungs are adequately inflated. The interstitial markings in the right lung are slightly more conspicuous today. On the left the interstitium is stable to slightly improved. The cardiopericardial silhouette remains mildly enlarged. The central pulmonary vascularity is slightly less conspicuous today. The mediastinum is normal in width. The right internal jugular venous catheter tip overlies the proximal portion of the SVC.  IMPRESSION: Slightly increased prominence of the pulmonary interstitium on the right may reflect asymmetric pulmonary edema or less likely interstitial pneumonia. The pulmonary interstitium on the left has improved slightly. When the patient can tolerate the procedure, a PA and lateral chest x-ray would be useful.   Electronically Signed   By: David  [place]   On: 07/02/2014 08:49   . aspirin EC  81 mg Oral Daily  . atorvastatin  20 mg Oral q1800  . buPROPion  300 mg Oral Daily  . calcitRIOL  0.25 mcg Oral Daily  . darbepoetin  (ARANESP) injection - DIALYSIS  60 mcg Intravenous Q Sat-HD  . feeding supplement (NEPRO CARB STEADY)  237 mL Oral BID BM  . heparin  5,000 Units Subcutaneous 3 times per day  . insulin aspart  0-15 Units Subcutaneous TID WC  . insulin aspart  0-5 Units Subcutaneous QHS  . insulin aspart  4 Units Subcutaneous TID WC  . insulin glargine  20 Units Subcutaneous QHS  . metoCLOPramide (REGLAN) injection  5 mg Intravenous 4 times per day  . pantoprazole  40 mg Oral Daily  . piperacillin-tazobactam (ZOSYN)  IV  2.25 g Intravenous 3 times per day  . pneumococcal 23 valent vaccine  0.5 mL Intramuscular Tomorrow-1000    BMET    Component Value Date/Time   NA 134* 07/03/2014 0212   K 4.0 07/03/2014 0212   CL 92* 07/03/2014 0212   CO2 24 07/03/2014 0212   GLUCOSE 220* 07/03/2014 0212   BUN 29* 07/03/2014 0212   CREATININE 5.81* 07/03/2014 0212   CALCIUM 7.9* 07/03/2014 0212   GFRNONAA 9* 07/03/2014 0212   GFRAA 11* 07/03/2014 0212   CBC    Component Value Date/Time   WBC 12.3* 07/03/2014 0212   RBC 3.64* 07/03/2014 0212   RBC 3.63* 07/02/2014 0245   HGB 10.1* 07/03/2014 0212   HCT 31.4* 07/03/2014 0212   PLT 253 07/03/2014 0212   MCV 86.3 07/03/2014 0212   MCH 27.7 07/03/2014 0212   MCHC 32.2 07/03/2014 0212   RDW 13.7 07/03/2014 0212   LYMPHSABS 1.3 07/03/2014 0212   MONOABS 1.7* 07/03/2014 0212   EOSABS 0.6 07/03/2014 0212   BASOSABS 0.0 07/03/2014 0212     Assessment/Plan: 68 y.o. male with progressive CKD, DM, HTN; admitted for worsening SOB with Scr 10.2 and K of 5.6. HD started in hospital.  1. 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 Plan for HD today. 2. Vascular access: Temp trialysis catheter. TDC and R AVF planned for Friday by [facility]. 3. Pulmonary- Cont with UF and treatment of PNA. No bipap req, on [place]. 4. SHPTH: Phos 7.4, Ca 7.9, iPTH 535.  On calcitriol, may need to start phosphate binders once taking po  5. ACD: weekly aranesp  6. AG metabolic  acidosis: resolved with HD  7. Hyperkalemia: resolved with HD  8. DM: per primary  9. HTN: discontinued amlodipine for better toleration of UF with HD 10. CAP: on zosyn only  Tawanna Sat  Renal Attending: He has improved clinically. Lungs clearer. Finalizing arrangements for outpatient dialysis and permanent access.  For AV access and PC in AM.  Awaiting CLIP information. Ron Beske C

## 2014-07-05 ENCOUNTER — Inpatient Hospital Stay (HOSPITAL_COMMUNITY): Payer: Medicare Other

## 2014-07-05 ENCOUNTER — Inpatient Hospital Stay (HOSPITAL_COMMUNITY): Payer: Medicare Other | Admitting: Certified Registered Nurse Anesthetist

## 2014-07-05 ENCOUNTER — Encounter (HOSPITAL_COMMUNITY)
Admission: EM | Disposition: A | Discharge status: Skilled Nursing Facility | Payer: Self-pay | Source: Home / Self Care | Attending: Internal Medicine

## 2014-07-05 ENCOUNTER — Encounter (HOSPITAL_COMMUNITY): Payer: Medicare Other | Admitting: Certified Registered Nurse Anesthetist

## 2014-07-05 HISTORY — PX: REMOVAL OF A DIALYSIS CATHETER: SHX6053

## 2014-07-05 HISTORY — PX: AV FISTULA PLACEMENT: SHX1204

## 2014-07-05 HISTORY — PX: INSERTION OF DIALYSIS CATHETER: SHX1324

## 2014-07-05 LAB — GLUCOSE, CAPILLARY
GLUCOSE-CAPILLARY: 169 mg/dL — AB (ref 70–99)
GLUCOSE-CAPILLARY: 169 mg/dL — AB (ref 70–99)
GLUCOSE-CAPILLARY: 175 mg/dL — AB (ref 70–99)
Glucose-Capillary: 160 mg/dL — ABNORMAL HIGH (ref 70–99)
Glucose-Capillary: 226 mg/dL — ABNORMAL HIGH (ref 70–99)

## 2014-07-05 LAB — BASIC METABOLIC PANEL
Anion gap: 19 — ABNORMAL HIGH (ref 5–15)
BUN: 45 mg/dL — ABNORMAL HIGH (ref 6–23)
CALCIUM: 7.6 mg/dL — AB (ref 8.4–10.5)
CO2: 21 meq/L (ref 19–32)
Chloride: 95 mEq/L — ABNORMAL LOW (ref 96–112)
Creatinine, Ser: 7.98 mg/dL — ABNORMAL HIGH (ref 0.50–1.35)
GFR calc Af Amer: 7 mL/min — ABNORMAL LOW (ref >90)
GFR calc non Af Amer: 6 mL/min — ABNORMAL LOW (ref >90)
GLUCOSE: 189 mg/dL — AB (ref 70–99)
Potassium: 4.1 mEq/L (ref 3.7–5.3)
SODIUM: 135 meq/L — AB (ref 137–147)

## 2014-07-05 SURGERY — INSERTION OF DIALYSIS CATHETER
Anesthesia: Monitor Anesthesia Care | Laterality: Right

## 2014-07-05 SURGERY — Surgical Case
Anesthesia: *Unknown

## 2014-07-05 MED ORDER — SODIUM CHLORIDE 0.9 % IV SOLN
INTRAVENOUS | Status: DC | PRN
  Administered 2014-07-05: 13:00:00 via INTRAVENOUS

## 2014-07-05 MED ORDER — LIDOCAINE HCL (CARDIAC) 20 MG/ML IV SOLN
INTRAVENOUS | Status: AC
  Filled 2014-07-05: qty 5

## 2014-07-05 MED ORDER — PROPOFOL 10 MG/ML IV BOLUS
INTRAVENOUS | Status: DC | PRN
  Administered 2014-07-05 (×2): 10 mg via INTRAVENOUS

## 2014-07-05 MED ORDER — 0.9 % SODIUM CHLORIDE (POUR BTL) OPTIME
TOPICAL | Status: DC | PRN
  Administered 2014-07-05: 1000 mL

## 2014-07-05 MED ORDER — OXYCODONE-ACETAMINOPHEN 5-325 MG PO TABS
1.0000 | ORAL_TABLET | ORAL | Status: DC | PRN
  Administered 2014-07-06: 1 via ORAL
  Administered 2014-07-07 – 2014-07-09 (×3): 2 via ORAL
  Filled 2014-07-05 (×3): qty 2
  Filled 2014-07-05: qty 1
  Filled 2014-07-05: qty 2

## 2014-07-05 MED ORDER — LIDOCAINE-EPINEPHRINE (PF) 1 %-1:200000 IJ SOLN
INTRAMUSCULAR | Status: AC
  Filled 2014-07-05: qty 10

## 2014-07-05 MED ORDER — CEFAZOLIN SODIUM-DEXTROSE 2-3 GM-% IV SOLR
INTRAVENOUS | Status: DC | PRN
  Administered 2014-07-05: 2 g via INTRAVENOUS

## 2014-07-05 MED ORDER — FENTANYL CITRATE 0.05 MG/ML IJ SOLN
INTRAMUSCULAR | Status: DC | PRN
  Administered 2014-07-05 (×2): 25 ug via INTRAVENOUS

## 2014-07-05 MED ORDER — ONDANSETRON HCL 4 MG/2ML IJ SOLN
INTRAMUSCULAR | Status: DC | PRN
  Administered 2014-07-05: 4 mg via INTRAVENOUS

## 2014-07-05 MED ORDER — LIDOCAINE HCL (CARDIAC) 20 MG/ML IV SOLN
INTRAVENOUS | Status: DC | PRN
  Administered 2014-07-05: 40 mg via INTRAVENOUS

## 2014-07-05 MED ORDER — MORPHINE SULFATE 2 MG/ML IJ SOLN
1.0000 mg | INTRAMUSCULAR | Status: DC | PRN
Start: 2014-07-05 — End: 2014-07-09

## 2014-07-05 MED ORDER — PROPOFOL INFUSION 10 MG/ML OPTIME
INTRAVENOUS | Status: DC | PRN
  Administered 2014-07-05: 50 ug/kg/min via INTRAVENOUS
  Administered 2014-07-05: 10 ug/kg/min via INTRAVENOUS

## 2014-07-05 MED ORDER — SODIUM CHLORIDE 0.9 % IV SOLN
INTRAVENOUS | Status: DC
Start: 2014-07-05 — End: 2014-07-07
  Administered 2014-07-05: 13:00:00 via INTRAVENOUS

## 2014-07-05 MED ORDER — FENTANYL CITRATE 0.05 MG/ML IJ SOLN
INTRAMUSCULAR | Status: AC
  Filled 2014-07-05: qty 5

## 2014-07-05 MED ORDER — CEFAZOLIN SODIUM-DEXTROSE 2-3 GM-% IV SOLR
INTRAVENOUS | Status: AC
  Filled 2014-07-05: qty 50

## 2014-07-05 MED ORDER — SODIUM CHLORIDE 0.9 % IR SOLN
Status: DC | PRN
  Administered 2014-07-05: 14:00:00

## 2014-07-05 MED ORDER — MIDAZOLAM HCL 2 MG/2ML IJ SOLN
INTRAMUSCULAR | Status: AC
  Filled 2014-07-05: qty 2

## 2014-07-05 MED ORDER — PROPOFOL 10 MG/ML IV BOLUS
INTRAVENOUS | Status: AC
  Filled 2014-07-05: qty 20

## 2014-07-05 MED ORDER — HEPARIN SODIUM (PORCINE) 1000 UNIT/ML IJ SOLN
INTRAMUSCULAR | Status: AC
  Filled 2014-07-05: qty 1

## 2014-07-05 MED ORDER — AMOXICILLIN-POT CLAVULANATE 500-125 MG PO TABS
1.0000 | ORAL_TABLET | ORAL | Status: AC
Start: 2014-07-05 — End: 2014-07-08
  Administered 2014-07-05 – 2014-07-08 (×4): 500 mg via ORAL
  Filled 2014-07-05 (×4): qty 1

## 2014-07-05 MED ORDER — BUPIVACAINE HCL (PF) 0.5 % IJ SOLN
INTRAMUSCULAR | Status: DC | PRN
  Administered 2014-07-05: 30 mL

## 2014-07-05 MED ORDER — AMOXICILLIN-POT CLAVULANATE 875-125 MG PO TABS: 1.0000 | ORAL_TABLET | Freq: Two times a day (BID) | ORAL | Status: DC

## 2014-07-05 MED ORDER — LIDOCAINE-EPINEPHRINE (PF) 1 %-1:200000 IJ SOLN
INTRAMUSCULAR | Status: DC | PRN
  Administered 2014-07-05: 30 mL

## 2014-07-05 MED ORDER — MIDAZOLAM HCL 5 MG/5ML IJ SOLN
INTRAMUSCULAR | Status: DC | PRN
  Administered 2014-07-05 (×2): 1 mg via INTRAVENOUS

## 2014-07-05 MED ORDER — THROMBIN 20000 UNITS EX SOLR
CUTANEOUS | Status: AC
  Filled 2014-07-05: qty 20000

## 2014-07-05 MED ORDER — HEPARIN SODIUM (PORCINE) 1000 UNIT/ML IJ SOLN
INTRAMUSCULAR | Status: DC | PRN
  Administered 2014-07-05: 1000 [IU]

## 2014-07-05 MED ORDER — METOCLOPRAMIDE HCL 5 MG PO TABS: 5.0000 mg | ORAL_TABLET | Freq: Four times a day (QID) | ORAL | Status: DC | PRN

## 2014-07-05 SURGICAL SUPPLY — 62 items
ARMBAND PINK RESTRICT EXTREMIT (MISCELLANEOUS) ×4 IMPLANT
BAG DECANTER FOR FLEXI CONT (MISCELLANEOUS) ×4 IMPLANT
BLADE SURG 10 STRL SS (BLADE) ×4 IMPLANT
CANISTER SUCTION 2500CC (MISCELLANEOUS) ×4 IMPLANT
CATH CANNON HEMO 15F 50CM (CATHETERS) IMPLANT
CATH CANNON HEMO 15FR 19 (HEMODIALYSIS SUPPLIES) IMPLANT
CATH CANNON HEMO 15FR 23CM (HEMODIALYSIS SUPPLIES) IMPLANT
CATH CANNON HEMO 15FR 31CM (HEMODIALYSIS SUPPLIES) IMPLANT
CATH CANNON HEMO 15FR 32CM (HEMODIALYSIS SUPPLIES) ×4 IMPLANT
CATH STRAIGHT 5FR 65CM (CATHETERS) IMPLANT
CLIP TI MEDIUM 6 (CLIP) ×4 IMPLANT
CLIP TI WIDE RED SMALL 6 (CLIP) ×4 IMPLANT
COVER PROBE W GEL 5X96 (DRAPES) ×8 IMPLANT
COVER SURGICAL LIGHT HANDLE (MISCELLANEOUS) ×4 IMPLANT
DECANTER SPIKE VIAL GLASS SM (MISCELLANEOUS) ×4 IMPLANT
DERMABOND ADVANCED (GAUZE/BANDAGES/DRESSINGS) ×4
DERMABOND ADVANCED .7 DNX12 (GAUZE/BANDAGES/DRESSINGS) ×4 IMPLANT
DRAPE C-ARM 42X72 X-RAY (DRAPES) ×4 IMPLANT
DRAPE CHEST BREAST 15X10 FENES (DRAPES) ×4 IMPLANT
ELECT REM PT RETURN 9FT ADLT (ELECTROSURGICAL) ×4
ELECTRODE REM PT RTRN 9FT ADLT (ELECTROSURGICAL) ×2 IMPLANT
GAUZE SPONGE 2X2 8PLY NS (GAUZE/BANDAGES/DRESSINGS) ×4 IMPLANT
GAUZE SPONGE 2X2 8PLY STRL LF (GAUZE/BANDAGES/DRESSINGS) ×2 IMPLANT
GLOVE BIO SURGEON STRL SZ7 (GLOVE) ×8 IMPLANT
GLOVE BIOGEL PI IND STRL 6.5 (GLOVE) ×8 IMPLANT
GLOVE BIOGEL PI IND STRL 7.5 (GLOVE) ×6 IMPLANT
GLOVE BIOGEL PI INDICATOR 6.5 (GLOVE) ×8
GLOVE BIOGEL PI INDICATOR 7.5 (GLOVE) ×6
GLOVE ECLIPSE 6.5 STRL STRAW (GLOVE) ×8 IMPLANT
GLOVE SS BIOGEL STRL SZ 7 (GLOVE) ×2 IMPLANT
GLOVE SUPERSENSE BIOGEL SZ 7 (GLOVE) ×2
GLOVE SURG SS PI 7.0 STRL IVOR (GLOVE) ×4 IMPLANT
GOWN STRL REUS W/ TWL LRG LVL3 (GOWN DISPOSABLE) ×10 IMPLANT
GOWN STRL REUS W/ TWL XL LVL3 (GOWN DISPOSABLE) ×2 IMPLANT
GOWN STRL REUS W/TWL LRG LVL3 (GOWN DISPOSABLE) ×10
GOWN STRL REUS W/TWL XL LVL3 (GOWN DISPOSABLE) ×2
KIT BASIN OR (CUSTOM PROCEDURE TRAY) ×4 IMPLANT
KIT ROOM TURNOVER OR (KITS) ×4 IMPLANT
NEEDLE 18GX1X1/2 (RX/OR ONLY) (NEEDLE) ×4 IMPLANT
NEEDLE HYPO 25GX1X1/2 BEV (NEEDLE) IMPLANT
NS IRRIG 1000ML POUR BTL (IV SOLUTION) ×4 IMPLANT
PACK CV ACCESS (CUSTOM PROCEDURE TRAY) ×4 IMPLANT
PACK SURGICAL SETUP 50X90 (CUSTOM PROCEDURE TRAY) IMPLANT
PAD ARMBOARD 7.5X6 YLW CONV (MISCELLANEOUS) ×8 IMPLANT
SOAP 2 % CHG 4 OZ (WOUND CARE) ×4 IMPLANT
SPONGE GAUZE 2X2 STER 10/PKG (GAUZE/BANDAGES/DRESSINGS) ×2
SPONGE GAUZE 4X4 12PLY STER LF (GAUZE/BANDAGES/DRESSINGS) ×4 IMPLANT
SPONGE SURGIFOAM ABS GEL 100 (HEMOSTASIS) IMPLANT
SUT ETHILON 3 0 PS 1 (SUTURE) ×4 IMPLANT
SUT MNCRL AB 4-0 PS2 18 (SUTURE) ×4 IMPLANT
SUT PROLENE 6 0 BV (SUTURE) ×8 IMPLANT
SUT PROLENE 7 0 BV 1 (SUTURE) IMPLANT
SUT VIC AB 3-0 SH 27 (SUTURE) ×2
SUT VIC AB 3-0 SH 27X BRD (SUTURE) ×2 IMPLANT
SYR 20CC LL (SYRINGE) ×4 IMPLANT
SYR 3ML LL SCALE MARK (SYRINGE) ×4 IMPLANT
SYR 5ML LL (SYRINGE) ×4 IMPLANT
SYRINGE 10CC LL (SYRINGE) ×4 IMPLANT
TAPE CLOTH SURG 4X10 WHT LF (GAUZE/BANDAGES/DRESSINGS) ×8 IMPLANT
TOWEL OR 17X24 6PK STRL BLUE (TOWEL DISPOSABLE) ×4 IMPLANT
UNDERPAD 30X30 INCONTINENT (UNDERPADS AND DIAPERS) ×4 IMPLANT
WATER STERILE IRR 1000ML POUR (IV SOLUTION) ×4 IMPLANT

## 2014-07-05 NOTE — Interval H&P Note (Signed)
[facility] [place]  History and Physical Update  The patient was interviewed and re-examined.  The patient's previous History and Physical has been reviewed and is unchanged from Dr. Trula Slade consult except for: interval improvement in respiratory status.  There is no change in the plan of care: placement of tunneled dialysis catheter and placement of R BC AVF.  The patient is aware the risks of tunneled dialysis catheter placement include but are not limited to: bleeding, infection, central venous injury, pneumothorax, possible venous stenosis, possible malpositioning in the venous system, and possible infections related to long-term catheter presence.   Risk, benefits, and alternatives to access surgery were discussed.  The patient is aware the risks include but are not limited to: bleeding, infection, steal syndrome, nerve damage, ischemic monomelic neuropathy, failure to mature, need for additional procedures, death and stroke.  The patient agrees to proceed forward with the procedure.   Adele Barthel, MD [facility] [place] Office: [phone] Pager: [phone]  07/05/2014, 1:08 PM

## 2014-07-05 NOTE — Progress Notes (Signed)
OT Cancellation Note  Patient Details Name: Blake [name] MRN: [id number] DOB: [birth year]   Cancelled Treatment:    Reason Eval/Treat Not Completed: Patient at procedure or test/ unavailable (down for precdure: placement of tunneled dialysis catheter and placement of R BC AVF).  Will attempt to see this weekend as time permits.  [place], [place] 07/05/2014, 2:35 PM

## 2014-07-05 NOTE — Progress Notes (Signed)
Patient ID: Blake [name], male    DOB: 1946/06/12, 68 y.o.   MRN: [id number] Nephrology Progress Note  Assessment/Plan: 68 y.o. male with progressive CKD, DM, HTN; admitted for worsening SOB with Scr 10.2 and K of 5.6. HD started in hospital.  1. 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 UF 1.5L 10/23. Next HD 10/24. Setting up output HD. 2. Vascular access: Temp trialysis catheter. TDC and R AVF planned for 10/23 by [facility] (now appears to be delayed again) 3. Pulmonary- Cont with UF and treatment of PNA. No bipap req, on [place]. 4. SHPTH: Phos 7.4, Ca 7.9, iPTH 535.  On calcitriol, may need to start phosphate binders once taking po 5. ACD: weekly aranesp  6. AG metabolic acidosis: resolved with HD  7. Hyperkalemia: resolved with HD  8. DM: per primary  9. HTN: discontinued amlodipine for better toleration of UF with HD 10. CAP: on zosyn only, planned transition to levaquin if improving  Renal Attending: He has a TTS slot and we plan HD in AM to put on schedule. We hope to have the details so he can be discharged after dialysis in AM. Branda Chaudhary C   _________________________________ S: Sleepy, will awaken but slurs words; states he is just tired and otherwise feels okay  O:BP 118/65  Pulse 84  Temp(Src) 98.6 F (37 C) (Oral)  Resp 20  Ht 5\' 8"  (1.727 m)  Wt 216 lb 11.4 oz (98.3 kg)  BMI 32.96 kg/m2  SpO2 96%  Intake/Output Summary (Last 24 hours) at 07/05/14 0751 Last data filed at 07/05/14 0600  Gross per 24 hour  Intake    560 ml  Output   1050 ml  Net   -490 ml   Intake/Output: I/O last 3 completed shifts: In: 780 [P.O.:480; IV Piggyback:300] Out: 1050 [Urine:150; Other:900]  Intake/Output this shift:    Weight change: -10.6 oz (-0.3 kg)  Gen: NAD CVS: RRR, normal heart sounds, no murmurs Resp: mild bibasilar crackles, improved. Nasal canula in place. Abd: soft, +BS Ext: trace edema bilat Neuro: drowsy, slurred speech    Recent Labs Lab  06/28/14 0755 06/28/14 0818 06/28/14 2208 06/29/14 0500 06/30/14 0250 07/01/14 0444 07/02/14 0245 07/03/14 0212 07/05/14 0455  NA 136* 137  --  138 137 137 139 134* 135*  K 5.6* 5.3  --  4.9 4.2 4.9 4.2 4.0 4.1  CL 102 112  --  101 96 95* 97 92* 95*  CO2 10*  --   --  18* 22 21 22 24 21   GLUCOSE 140* 142*  --  107* 105* 162* 91 220* 189*  BUN 92* 94*  --  67* 48* 65* 32* 29* 45*  CREATININE 9.37* 10.20*  --  7.73* 6.55* 8.90* 6.06* 5.81* 7.98*  ALBUMIN 2.1*  --   --   --  2.0*  --  2.0* 2.2*  --   CALCIUM 7.7*  --   --  7.7* 7.9* 7.8* 8.2* 7.9* 7.6*  PHOS  --   --  5.9*  --  7.4*  --   --   --   --   AST 20  --   --   --   --   --  20 44*  --   ALT 13  --   --   --   --   --  16 36  --    Liver Function Tests:  Recent Labs Lab 06/28/14 0755 06/30/14 0250 07/02/14 0245 07/03/14 [id number]  AST 20  --  20 44*  ALT 13  --  16 36  ALKPHOS 117  --  114 130*  BILITOT 0.3  --  0.3 0.2*  PROT 7.7  --  7.9 8.0  ALBUMIN 2.1* 2.0* 2.0* 2.2*   No results found for this basename: LIPASE, AMYLASE,  in the last 168 hours No results found for this basename: AMMONIA,  in the last 168 hours CBC:  Recent Labs Lab 06/28/14 0755  06/29/14 0500 07/01/14 0444 07/02/14 0245 07/03/14 0212  WBC 15.8*  --  13.2* 13.1* 12.2* 12.3*  NEUTROABS 13.4*  --   --   --   --  8.7*  HGB 9.3*  < > 8.4* 8.7* 9.6* 10.1*  HCT 28.4*  < > 26.1* 27.0* 31.2* 31.4*  MCV 82.6  --  83.7 83.9 86.0 86.3  PLT 394  --  341 312 282 253  < > = values in this interval not displayed. Cardiac Enzymes:  Recent Labs Lab 06/28/14 0755 06/28/14 1000 06/28/14 2208  TROPONINI <0.30 <0.30 <0.30   CBG:  Recent Labs Lab 07/04/14 0803 07/04/14 1144 07/04/14 1601 07/04/14 1901 07/04/14 2042  GLUCAP 212* 192* 169* 113* 226*    Iron Studies:  No results found for this basename: IRON, TIBC, TRANSFERRIN, FERRITIN,  in the last 72 hours Studies/Results: No results found. Marland Kitchen aspirin EC  81 mg Oral Daily  .  atorvastatin  20 mg Oral q1800  . buPROPion  300 mg Oral Daily  . calcitRIOL  0.25 mcg Oral Daily  . darbepoetin (ARANESP) injection - DIALYSIS  60 mcg Intravenous Q Sat-HD  . feeding supplement (NEPRO CARB STEADY)  237 mL Oral BID BM  . heparin  5,000 Units Subcutaneous 3 times per day  . insulin aspart  0-15 Units Subcutaneous TID WC  . insulin aspart  0-5 Units Subcutaneous QHS  . insulin aspart  4 Units Subcutaneous TID WC  . insulin glargine  20 Units Subcutaneous QHS  . metoCLOPramide (REGLAN) injection  5 mg Intravenous 4 times per day  . pantoprazole  40 mg Oral Daily  . piperacillin-tazobactam (ZOSYN)  IV  2.25 g Intravenous 3 times per day    BMET    Component Value Date/Time   NA 135* 07/05/2014 0455   K 4.1 07/05/2014 0455   CL 95* 07/05/2014 0455   CO2 21 07/05/2014 0455   GLUCOSE 189* 07/05/2014 0455   BUN 45* 07/05/2014 0455   CREATININE 7.98* 07/05/2014 0455   CALCIUM 7.6* 07/05/2014 0455   GFRNONAA 6* 07/05/2014 0455   GFRAA 7* 07/05/2014 0455   CBC    Component Value Date/Time   WBC 12.3* 07/03/2014 0212   RBC 3.64* 07/03/2014 0212   RBC 3.63* 07/02/2014 0245   HGB 10.1* 07/03/2014 0212   HCT 31.4* 07/03/2014 0212   PLT 253 07/03/2014 0212   MCV 86.3 07/03/2014 0212   MCH 27.7 07/03/2014 0212   MCHC 32.2 07/03/2014 0212   RDW 13.7 07/03/2014 0212   LYMPHSABS 1.3 07/03/2014 0212   MONOABS 1.7* 07/03/2014 0212   EOSABS 0.6 07/03/2014 0212   BASOSABS 0.0 07/03/2014 0212     Tawanna Sat

## 2014-07-05 NOTE — Progress Notes (Signed)
[facility] TEAM 1 Transfer Progress Note  Blake [name] [id number] DOB: 11/23/1945 DOA: 06/28/2014 PCP: [name], MD  Admit HPI / Brief Narrative: 68 y/o M, non-smoker, with PMH of HTN, HLD, DM, and CKD with recent work up by Dr. Oneida Alar for AVF placement and planned start of HD in the near future who presented to [facility] ER on 10/15 with complaints of worsening shortness of breath for a week, or more.  He was seen in office by Dr. Oneida Alar on 10/15 for review of potential AVF placement. Office notes at that time reflect he was 84% on RA. Per notes, he was planned to have a graft placed on 10/28.   On presentation to the ER, he was noted to have significant shortness of breath and saturations of 47%. He was placed on BiPAP for increased work of breathing. CXR was concerning for R sided airspace disease (edema vs infiltrate).   HPI/Subjective: Pt is sleepy, but overall feels better.    Assessment/Plan:  Acute Hypoxemic Respiratory Failure  - pulm edema + possible CAP  -appears to be stabilizing w/ ongoing HD - O2 requirement decreasing - nonetheless, WBC remains elevated -Wean O2 -Doubt pneumonia, stopped Vancomycin -Stop Zosyn today and change to PO Augmentin for 42more days  Acute on CKD HD initiated during this admission - temp trialysis catheter - planned tunnel cath and RUE AVF today - creatinine in July 2012 was 1.9 - crt 2.17 Oct 2012  Acute Encephalopathy - in the setting of uremia / AKI Appears to be improving steadily - B12 and folate normal   Anemia of chronic kidney disease Fe indices c/w deficiency - epo + Fe per Nephrology   Hyperkalemia resolved w/ HD   DM2 w/ renal complications  Overall trend is of improvement - A1c 6.0  HTN BP well controlled at present   HLD LDL at goal   Obesity - Body mass index is 32.96 kg/(m^2).  Code Status: FULL Family Communication: no family present at time of exam Disposition Plan: home pending HD access and CLIP for  HD   Consultants: Vasc Surgery  PCCM > [facility]  Nephrology    Procedures: 10/16 - temp HD catheter  Antibiotics: Vanc 10/16 > Ceftrx 10/16 >10/21 Azithro 10/16 > Zosyn 10/21 >  DVT prophylaxis: SQ heparin   Objective: Blood pressure 115/50, pulse 78, temperature 98.4 F (36.9 C), temperature source Oral, resp. rate 18, height 5\' 8"  (1.727 m), weight 98.3 kg (216 lb 11.4 oz), SpO2 96.00%.  Intake/Output Summary (Last 24 hours) at 07/05/14 1247 Last data filed at 07/05/14 0900  Gross per 24 hour  Intake    560 ml  Output    900 ml  Net   -340 ml   Exam: General: AAOx3, no distress Lungs: CTA th/o w no wheeze  Cardiovascular: Regular rate and rhythm without murmur gallop or rub  Abdomen: Nontender, nondistended, soft, bowel sounds positive, no rebound, no ascites, no appreciable mass Extremities: No significant cyanosis, clubbing;  trace edema bilateral lower extremities  Data Reviewed: Basic Metabolic Panel:  Recent Labs Lab 06/28/14 2208  06/30/14 0250 07/01/14 0444 07/02/14 0245 07/03/14 0212 07/05/14 0455  NA  --   < > 137 137 139 134* 135*  K  --   < > 4.2 4.9 4.2 4.0 4.1  CL  --   < > 96 95* 97 92* 95*  CO2  --   < > 22 21 22 24 21   GLUCOSE  --   < >  105* 162* 91 220* 189*  BUN  --   < > 48* 65* 32* 29* 45*  CREATININE  --   < > 6.55* 8.90* 6.06* 5.81* 7.98*  CALCIUM  --   < > 7.9* 7.8* 8.2* 7.9* 7.6*  MG  --   --   --   --   --  2.1  --   PHOS 5.9*  --  7.4*  --   --   --   --   < > = values in this interval not displayed.  Liver Function Tests:  Recent Labs Lab 06/30/14 0250 07/02/14 0245 07/03/14 0212  AST  --  20 44*  ALT  --  16 36  ALKPHOS  --  114 130*  BILITOT  --  0.3 0.2*  PROT  --  7.9 8.0  ALBUMIN 2.0* 2.0* 2.2*    Coags:  Recent Labs Lab 07/02/14 0245  INR 1.33    Recent Labs Lab 07/02/14 0245  APTT 34    CBC:  Recent Labs Lab 06/29/14 0500 07/01/14 0444 07/02/14 0245 07/03/14 0212  WBC 13.2* 13.1* 12.2*  12.3*  NEUTROABS  --   --   --  8.7*  HGB 8.4* 8.7* 9.6* 10.1*  HCT 26.1* 27.0* 31.2* 31.4*  MCV 83.7 83.9 86.0 86.3  PLT 341 312 282 253    Cardiac Enzymes:  Recent Labs Lab 06/28/14 2208  TROPONINI <0.30   BNP (last 3 results)  Recent Labs  06/28/14 0755  PROBNP 1556.0*    CBG:  Recent Labs Lab 07/04/14 1601 07/04/14 1901 07/04/14 2042 07/05/14 0757 07/05/14 1142  GLUCAP 169* 113* 226* 169* 169*    Recent Results (from the past 240 hour(s))  CULTURE, BLOOD (ROUTINE X 2)     Status: None   Collection Time    06/28/14  9:45 AM      Result Value Ref Range Status   Specimen Description BLOOD LEFT HAND   Final   Special Requests BOTTLES DRAWN AEROBIC AND ANAEROBIC 10ML   Final   Culture  Setup Time     Final   Value: 06/28/2014 17:15     Performed at [facility]   Culture     Final   Value: NO GROWTH 5 DAYS     Performed at [facility]   Report Status 07/04/2014 FINAL   Final  CULTURE, BLOOD (ROUTINE X 2)     Status: None   Collection Time    06/28/14 10:00 AM      Result Value Ref Range Status   Specimen Description BLOOD LEFT ARM   Final   Special Requests BOTTLES DRAWN AEROBIC AND ANAEROBIC 10ML   Final   Culture  Setup Time     Final   Value: 06/28/2014 17:14     Performed at [facility]   Culture     Final   Value: NO GROWTH 5 DAYS     Performed at [facility]   Report Status 07/04/2014 FINAL   Final  MRSA PCR SCREENING     Status: None   Collection Time    06/28/14  1:57 PM      Result Value Ref Range Status   MRSA by PCR NEGATIVE  NEGATIVE Final   Comment:            The GeneXpert MRSA Assay (FDA     approved for NASAL specimens     only), is one component of a  comprehensive MRSA colonization     surveillance program. It is not     intended to diagnose MRSA     infection nor to guide or     monitor treatment for     MRSA infections.  URINE CULTURE     Status: None   Collection Time    06/28/14   2:17 PM      Result Value Ref Range Status   Specimen Description URINE, CLEAN CATCH   Final   Special Requests NONE   Final   Culture  Setup Time     Final   Value: 06/28/2014 21:00     Performed at [place]     Final   Value: NO GROWTH     Performed at [facility]   Culture     Final   Value: NO GROWTH     Performed at [facility]   Report Status 06/29/2014 FINAL   Final  CLOSTRIDIUM DIFFICILE BY PCR     Status: None   Collection Time    07/04/14  3:06 PM      Result Value Ref Range Status   C difficile by pcr NEGATIVE  NEGATIVE Final     Studies:  Recent x-ray studies have been reviewed in detail by the Attending Physician  Scheduled Meds:  Scheduled Meds: . [facility] HOLD] aspirin EC  81 mg Oral Daily  . [facility] HOLD] atorvastatin  20 mg Oral q1800  . [MAR HOLD] buPROPion  300 mg Oral Daily  . [facility] HOLD] calcitRIOL  0.25 mcg Oral Daily  . [MAR HOLD] darbepoetin (ARANESP) injection - DIALYSIS  60 mcg Intravenous Q Sat-HD  . [MAR HOLD] feeding supplement (NEPRO CARB STEADY)  237 mL Oral BID BM  . [MAR HOLD] heparin  5,000 Units Subcutaneous 3 times per day  . [MAR HOLD] insulin aspart  0-15 Units Subcutaneous TID WC  . [MAR HOLD] insulin aspart  0-5 Units Subcutaneous QHS  . [MAR HOLD] insulin aspart  4 Units Subcutaneous TID WC  . [MAR HOLD] insulin glargine  20 Units Subcutaneous QHS  . [MAR HOLD] pantoprazole  40 mg Oral Daily  . [MAR HOLD] piperacillin-tazobactam (ZOSYN)  IV  2.25 g Intravenous 3 times per day    Time spent on care of this patient: 77 mins   Domenic Polite , MD   [facility] Office  [phone] Pager - Text Page per Shea Evans as per below:  On-Call/Text Page:      [email or web address]      password [email or web address]  If 7PM-7AM, please contact night-coverage [email or web address] Password [email or web address] 07/05/2014, 12:47 PM   LOS: 7 days

## 2014-07-05 NOTE — H&P (View-Only) (Signed)
[facility] [place]  Reason for Consult:  Worsening renal and respiratory failure Referring Physician:  Lamonte Sakai MRN #:  [id number]  History of Present Illness: This is a 68 y.o. male who presents to the ED today for worsening dyspnea. He was last seen in the [facility] office yesterday by Dr. Oneida Alar for permanent access evaluation. He was scheduled for 07/10/14 for a right brachiocephalic AV fistula.  During his visit, his O2 saturation was 84%, but was asymptomatic. He says his shortness began to progress yesterday and came into the ER. He states that he has shortness of breath with exertion and is unable to perform his usual activities.    He also mentions some right hand numbness that started about 5 months ago. This pain occurs during repetitive motion.   He has a past medical history of hypertension medically managed with an ACEI and CCB. He also has insulin dependent diabetes. He has hypercholesterolemia managed on a statin. He takes a daily aspirin. He has no prior history of stroke.  He currently denies any chest pain or shortness of breath.    Past Medical History  Diagnosis Date  . Diabetes mellitus without complication   . Hypertension   . Hyperlipidemia   . Renal disorder    Past Surgical History  Procedure Laterality Date  . Back surgery      No Known Allergies  Prior to Admission medications   Medication Sig Start Date End Date Taking? Authorizing Provider  allopurinol (ZYLOPRIM) 100 MG tablet Take 1 tablet (100 mg total) by mouth daily. 10/20/12  Yes [name], MD  amLODipine (NORVASC) 10 MG tablet Take 10 mg by mouth daily.   Yes Historical Provider, MD  aspirin EC 81 MG tablet Take 81 mg by mouth daily.   Yes Historical Provider, MD  atorvastatin (LIPITOR) 20 MG tablet Take 20 mg by mouth daily.   Yes Historical Provider, MD  buPROPion (WELLBUTRIN XL) 150 MG 24 hr tablet Take 300 mg by mouth daily.   Yes Historical Provider, MD    calcitRIOL (ROCALTROL) 0.25 MCG capsule Take 0.25 mcg by mouth daily.   Yes Historical Provider, MD  furosemide (LASIX) 40 MG tablet Take 40 mg by mouth 2 (two) times daily.   Yes Historical Provider, MD  insulin NPH Human (NOVOLIN N RELION) 100 UNIT/ML injection Inject 20 Units into the skin at bedtime.   Yes Historical Provider, MD  insulin regular (NOVOLIN R RELION) 100 units/mL injection Inject 4-10 Units into the skin 3 (three) times daily before meals.   Yes Historical Provider, MD  pantoprazole (PROTONIX) 40 MG tablet Take 1 tablet (40 mg total) by mouth daily. 10/20/12  Yes [name], MD    History   Social History  . Marital Status: Single    Spouse Name: N/A    Number of Children: N/A  . Years of Education: N/A   Occupational History  . Not on file.   Social History Main Topics  . Smoking status: Former Smoker -- 5 years    Types: Cigarettes  . Smokeless tobacco: Never Used  . Alcohol Use: No  . Drug Use: No  . Sexual Activity: Not on file   Other Topics Concern  . Not on file   Social History Narrative  . No narrative on file    Family History  Problem Relation Age of Onset  . Diabetes Father     ROS: [x]  Positive   [ ]  Negative   [ ]   All sytems reviewed and are negative  Cardiovascular: []  chest pain/pressure []  palpitations []  SOB lying flat [x]  DOE []  pain in legs while walking []  pain in legs at rest []  pain in legs at night []  non-healing ulcers []  hx of DVT []  swelling in legs  Pulmonary: []  productive cough []  asthma/wheezing []  home O2  Neurologic: [x]  weakness in [x]  right hand []  legs [x]  numbness in [x]  right hand []  legs []  hx of CVA []  mini stroke [] difficulty speaking or slurred speech []  temporary loss of vision in one eye []  dizziness  Hematologic: []  hx of cancer []  bleeding problems []  problems with blood clotting easily  Endocrine:   []  diabetes []  thyroid disease  GI []  vomiting blood []  blood in  stool  GU: [x]  CKD/renal failure []  HD--[]  M/W/F or []  T/T/S []  burning with urination []  blood in urine  Psychiatric: []  anxiety []  depression  Musculoskeletal: []  arthritis []  joint pain  Integumentary: []  rashes []  ulcers  Constitutional: []  fever []  chills   Physical Examination  Filed Vitals:   06/28/14 1000  BP: 121/59  Pulse: 78  Temp:   Resp: 21   There is no weight on file to calculate BMI.  General:  WDWN in NAD, alert and oriented on bipap Gait: Not observed HENT: WNL, normocephalic Eyes: Pupils equal Pulmonary: on bipap, some rhonchi bilaterally Cardiac: regular, without  Murmurs, rubs or gallops; without carotid bruits Abdomen: soft, NT/ND, no masses Skin: without rashes, without ulcers  Vascular Exam/Pulses:  Right Left  Radial 2+ (normal) 2+ (normal)  DP 2+ (normal) 2+ (normal)  PT 1+ (weak) Non palpable   Extremities: without ischemic changes, without Gangrene , without cellulitis; without open wounds;  Musculoskeletal: no muscle wasting or atrophy  Neurologic: A&O X 3; Appropriate Affect ; SENSATION: normal; MOTOR FUNCTION:  Slight weakness in right hand grip. 5/5 strength all other extremities. Speech is fluent/normal   CBC    Component Value Date/Time   WBC 15.8* 06/28/2014 0755   RBC 3.44* 06/28/2014 0755   HGB 10.9* 06/28/2014 0818   HCT 32.0* 06/28/2014 0818   PLT 394 06/28/2014 0755   MCV 82.6 06/28/2014 0755   MCH 27.0 06/28/2014 0755   MCHC 32.7 06/28/2014 0755   RDW 13.8 06/28/2014 0755   LYMPHSABS 0.5* 06/28/2014 0755   MONOABS 1.5* 06/28/2014 0755   EOSABS 0.3 06/28/2014 0755   BASOSABS 0.0 06/28/2014 0755    BMET    Component Value Date/Time   NA 137 06/28/2014 0818   K 5.3 06/28/2014 0818   CL 112 06/28/2014 0818   CO2 10* 06/28/2014 0755   GLUCOSE 142* 06/28/2014 0818   BUN 94* 06/28/2014 0818   CREATININE 10.20* 06/28/2014 0818   CALCIUM 7.7* 06/28/2014 0755   GFRNONAA 5* 06/28/2014 0755   GFRAA 6*  06/28/2014 0755    COAGS: No results found for this basename: INR, PROTIME      ASSESSMENT: This is a 68 y.o. male with acute on chronic renal failure and acute respiratory failure.   PLAN: -Temporary catheter for emergent dialysis to be placed today by CCM. -We will plan for tunneled dialysis catheter placement and right brachiocephalic AVF for Monday [id number] with Dr. Bridgett Larsson.    Virgina Jock, PA-C [facility] Office: [phone] Pager: [phone]   I agree with the sbove. The patient was seen by Dr. Oneida Alar on 06/27/2014 and scheduled for right arm AVF.  He was admitted with respiratory failure and started on HD.  We will addd him to the schedule ffor fistula on Monday.  Annamarie Major

## 2014-07-05 NOTE — Evaluation (Signed)
Physical Therapy Evaluation Patient Details Name: Blake [name] MRN: [id number] DOB: 11/09/1945 Today's Date: 07/05/2014   History of Present Illness  Pt is a 68 y/o M, non-smoker, with PMH of HTN, HLD, DM, CKD with recent work up for AVF placement and planned start of HD in the near future who presented to [facility] ER on 10/15 with complaints of worsening shortness of breath x 1 month. Specifically it has worsened over the past week prior to presentation. He was seen in office by Dr. Oneida Alar on 10/15 for review of potential AVF placement. Office notes at that time reflect he was 84% on RA. Per notes, he was planned to have a graft placed on 10/28. Niece (who lives with the patient) reports he has had a couple of falls over the last few months. At baseline, he continues to drive and is able to perform ADL's. On presentation to the ER, he was noted to have significant shortness of breath and saturations of 47%.  Clinical Impression  Pt admitted with the above. Pt currently with functional limitations due to the deficits listed below (see PT Problem List). At the time of PT eval pt was able to perform mobility with +2 assist for safety. When PT entered, pt on 2L/min supplemental O2 and sats reading at 84%. For mobility, O2 increased to 4L/min and sats remained 88-91% for transfers and decreased to 80% during ambulation. Pt will benefit from skilled PT to increase their independence and safety with mobility to allow discharge to the venue listed below.       Follow Up Recommendations SNF;Supervision/Assistance - 24 hour    Equipment Recommendations  Rolling walker with 5" wheels;3in1 (PT)    Recommendations for Other Services       Precautions / Restrictions Precautions Precautions: Fall Restrictions Weight Bearing Restrictions: No      Mobility  Bed Mobility Overal bed mobility: Needs Assistance Bed Mobility: Supine to Sit;Sit to Supine     Supine to sit: Min assist Sit to supine: Min  assist   General bed mobility comments: Pt required cues for sequencing and technique throughout transition to EOB. Once in long sitting pt asks "What exactly do you want me to do again?". Difficulty staying on task.   Transfers Overall transfer level: Needs assistance Equipment used: Rolling walker (2 wheeled) Transfers: Sit to/from [facility] Sit to Stand: Mod assist;+2 safety/equipment Stand pivot transfers: Min assist;+2 safety/equipment       General transfer comment: Pt very unsteady and requires assist to maintain balance. General safety awareness was low and +2 assist was required for lines.  Ambulation/Gait Ambulation/Gait assistance: Mod assist;+2 physical assistance;+2 safety/equipment Ambulation Distance (Feet): 5 Feet Assistive device: Rolling walker (2 wheeled) Gait Pattern/deviations: Step-to pattern;Decreased stride length;Trunk flexed;Leaning posteriorly Gait velocity: Decreased Gait velocity interpretation: Below normal speed for age/gender General Gait Details: +2 assist for support as pt attempted steps. He was able to ambulate 5 feet before therapist ended gait training as O2 sats dropped to 80% on 4L/min supplemental O2.  Stairs            Wheelchair Mobility    Modified Rankin (Stroke Patients Only)       Balance Overall balance assessment: Needs assistance;History of Falls Sitting-balance support: Feet supported;No upper extremity supported Sitting balance-Leahy Scale: Poor   Postural control: Posterior lean Standing balance support: Bilateral upper extremity supported;During functional activity Standing balance-Leahy Scale: Poor  Pertinent Vitals/Pain Pain Assessment: No/denies pain    Home Living Family/patient expects to be discharged to:: Private residence Living Arrangements: Other relatives (Niece) Available Help at Discharge: Family;Available PRN/intermittently Type of  Home: House Home Access: Level entry     Home Layout: Two level;Able to live on main level with bedroom/bathroom Home Equipment: Cane - quad      Prior Function Level of Independence: Independent with assistive device(s)         Comments: Pt states he was able to do all ADL's and did not need assist to ambulate as long as he had his cane     Hand Dominance   Dominant Hand: Right    Extremity/Trunk Assessment   Upper Extremity Assessment: Defer to OT evaluation           Lower Extremity Assessment: Generalized weakness      Cervical / Trunk Assessment: Kyphotic  Communication   Communication: No difficulties  Cognition Arousal/Alertness: Lethargic Behavior During Therapy: Flat affect Overall Cognitive Status: Within Functional Limits for tasks assessed                      General Comments      Exercises        Assessment/Plan    PT Assessment Patient needs continued PT services  PT Diagnosis Difficulty walking;Generalized weakness   PT Problem List Decreased strength;Decreased activity tolerance;Decreased range of motion;Decreased balance;Decreased mobility;Decreased knowledge of use of DME;Decreased safety awareness;Decreased knowledge of precautions  PT Treatment Interventions DME instruction;Gait training;Stair training;Functional mobility training;Therapeutic activities;Therapeutic exercise;Neuromuscular re-education;Patient/family education   PT Goals (Current goals can be found in the Care Plan section) Acute Rehab PT Goals Patient Stated Goal: Feel better (regarding breathing and nausea) PT Goal Formulation: With patient Time For Goal Achievement: 07/19/14 Potential to Achieve Goals: Good    Frequency Min 2X/week   Barriers to discharge        Co-evaluation               End of Session Equipment Utilized During Treatment: Gait belt;Oxygen Activity Tolerance: Patient limited by fatigue Patient left: in bed;with call  bell/phone within reach;with bed alarm set Nurse Communication: Mobility status;Other (comment) (O2 status)         Time: [id number] PT Time Calculation (min): 42 min   Charges:   PT Evaluation $Initial PT Evaluation Tier I: 1 Procedure PT Treatments $Gait Training: 8-22 mins $Therapeutic Activity: 23-37 mins   PT G Codes:          Rolinda Roan 07/05/2014, 1:05 PM  Rolinda Roan, PT, DPT Acute Rehabilitation Services Pager: [phone]

## 2014-07-05 NOTE — Anesthesia Postprocedure Evaluation (Signed)
  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  Procedure(s) Performed: Procedure(s): INSERTION OF DIALYSIS CATHETER-LEFT INTERNAL JUGULAR PLACEMENT (Left) ARTERIOVENOUS BRACHIALCEPHALIC (AV) FISTULA CREATION (Right) REMOVAL OF A DIALYSIS CATHETER (Right)  Patient Location: PACU  Anesthesia Type: MAC  Level of Consciousness: awake and alert   Airway and Oxygen Therapy: Patient Spontanous Breathing  Post-op Pain: none  Post-op Assessment: Post-op Vital signs reviewed, Patient's Cardiovascular Status Stable and Respiratory Function Stable  Post-op Vital Signs: Reviewed  Filed Vitals:   07/05/14 1537  BP: 116/58  Pulse: 78  Temp: 36.4 C  Resp: 16    Complications: No apparent anesthesia complications

## 2014-07-05 NOTE — Anesthesia Preprocedure Evaluation (Addendum)
Anesthesia Evaluation  Patient identified by MRN, date of birth, ID band Patient awake    Reviewed: Allergy & Precautions, H&P , NPO status , Patient's Chart, lab work & pertinent test results  Airway Mallampati: III TM Distance: >3 FB Neck ROM: Full    Dental no notable dental hx. (+) Teeth Intact, Dental Advisory Given   Pulmonary neg pulmonary ROS, former smoker,  breath sounds clear to auscultation  Pulmonary exam normal       Cardiovascular hypertension, Pt. on medications Rhythm:Regular Rate:Normal     Neuro/Psych negative neurological ROS  negative psych ROS   GI/Hepatic negative GI ROS, Neg liver ROS,   Endo/Other  diabetes, Type 1, Insulin Dependent  Renal/GU CRFRenal disease  negative genitourinary   Musculoskeletal   Abdominal   Peds  Hematology negative hematology ROS (+)   Anesthesia Other Findings   Reproductive/Obstetrics negative OB ROS                          Anesthesia Physical Anesthesia Plan  ASA: III  Anesthesia Plan: MAC   Post-op Pain Management:    Induction: Intravenous  Airway Management Planned: Simple Face Mask  Additional Equipment:   Intra-op Plan:   Post-operative Plan:   Informed Consent: I have reviewed the patients History and Physical, chart, labs and discussed the procedure including the risks, benefits and alternatives for the proposed anesthesia with the patient or authorized representative who has indicated his/her understanding and acceptance.   Dental advisory given  Plan Discussed with: CRNA  Anesthesia Plan Comments:         Anesthesia Quick Evaluation

## 2014-07-05 NOTE — Op Note (Signed)
OPERATIVE NOTE  PROCEDURE: 1. Left internal jugular vein tunneled dialysis catheter placement 2. Left internal jugular vein cannulation under ultrasound guidance 3. Right brachiocephalic arteriovenous fistula placement 4. Right temporary dialysis catheter removal  PRE-OPERATIVE DIAGNOSIS: end-stage renal failure  POST-OPERATIVE DIAGNOSIS: same as above  SURGEON: Adele Barthel, MD  ANESTHESIA: local and MAC  ESTIMATED BLOOD LOSS: 30 cc  FINDING(S): 1.  Tips of the catheter in the right atrium on fluoroscopy 2.  No obvious pneumothorax on fluoroscopy  SPECIMEN(S):  none  INDICATIONS:   Blake [name] is a 68 y.o. male who presents with end stage renal disease.  The patient presents for tunneled dialysis catheter placement and right arm access placement.  The patient is aware the risks of tunneled dialysis catheter placement include but are not limited to: bleeding, infection, central venous injury, pneumothorax, possible venous stenosis, possible malpositioning in the venous system, and possible infections related to long-term catheter presence.  Risk, benefits, and alternatives to access surgery were discussed.  The patient is aware the risks include but are not limited to: bleeding, infection, steal syndrome, nerve damage, ischemic monomelic neuropathy, failure to mature, need for additional procedures, death and stroke.  The patient was aware of these risks and agreed to proceed.  DESCRIPTION: After written full informed consent was obtained from the patient, the patient was taken back to the operating room.  Prior to induction, the patient was given IV antibiotics.  After obtaining adequate sedation, the patient was prepped and draped in the standard fashion for a chest or neck tunneled dialysis catheter placement.  I anesthesized the neck cannulation site with local anesthetic.  Under ultrasound guidance, the left internal jugular vein was cannulated with the 18 gauge needle.  A  J-wire was then placed down in the inferior vena cava under fluroscopic guidance.  The wire was then secured in place with a clamp to the drapes.  The cannulation site, the catheter exit site, and tract for the subcutaneous tunnel were then anesthesized with a total of 30 cc of a 1:1 mixture of 0.5% Marcaine without epinepherine and 1% Lidocaine with epinepherine.  I then made stab incisions at the neck and exit sites.   I dissected from the exit site to the cannulation site with a metal tunneler.   The subcutaneous tunnel was dilated by passing a plastic dilator over the metal dissector. The wire was then unclamped and I removed the needle.  The skin tract and venotomy was dilated serially with dilators.  Finally, the dilator-sheath was placed under fluroscopic guidance into the superior vena cava.  The dilator and wire were removed.  A 27 cm Diatek catheter was placed under fluoroscopic guidance down into the right atrium.  The sheath was broken and peeled away while holding the catheter cuff at the level of the skin.  The back end of this catheter was transected, revealing the two lumens of this catheter.  The ports were docked onto these two lumens.  The catheter hub was then screwed into place.  Each port was tested by aspirating and flushing.  No resistance was noted.  Each port was then thoroughly flushed with heparinized saline.  The catheter was secured in placed with two interrupted stitches of 3-0 Nylon tied to the catheter.  The neck incision was closed with a U-stitch of 4-0 Monocryl.  The neck and chest incision were cleaned and sterile bandages applied.  Each port was then loaded with concentrated heparin (1000 Units/mL) at the manufacturer  recommended volumes to each port.  Sterile caps were applied to each port.  On completion fluoroscopy, the tips of the catheter were in the right atrium, and there was no evidence of pneumothorax.  I then cut the securing sutures in the right internal jugular vein  temporary dialysis catheter.  I pulled out the catheter and held pressure for 3 minutes.  A sterile bandage was applied to the cannulation site.  The drapes were takened down and then the patient was repositioned and reprepped and draped in the standard fashion for a right arm access procedure.  I turned my attention first to identifying the patient's cephalic vein and brachial artery.  Using SonoSite guidance, the location of these vessels were marked out on the skin.   At this point, I injected local anesthetic to obtain a field block of the antecubitum.  In total, I injected about 5 mL of a 1:1 mixture of 0.5% Marcaine without epinephrine and 1% lidocaine with epinephrine.  I made a transverse incision at the level of the antecubitum and dissected through the subcutaneous tissue and fascia to gain exposure of the brachial artery.  This was noted to be 4 mm in diameter externally.  This was dissected out proximally and distally and controlled with vessel loops .  I then dissected out the cephalic vein.  This was noted to be 4 mm in diameter externally.  The distal segment of the vein was ligated with a  2-0 silk, and the vein was transected.  The proximal segment was iinterrogated with serial dilators.  The vein accepted up to a 5 mm dilator without any difficulty.  I then instilled the heparinized saline into the vein and clamped it.  At this point, I reset my exposure of the brachial artery and placed the artery under tension proximally and distally.  I made an arteriotomy with a #11 blade, and then I extended the arteriotomy with a Potts scissor.  I injected heparinized saline proximal and distal to this arteriotomy.  The vein was then sewn to the artery in an end-to-side configuration with a running stitch of 7-0 Prolene.  Prior to completing this anastomosis, I allowed the vein and artery to backbleed.  There was no evidence of clot from any vessels.  I completed the anastomosis in the usual fashion and  then released all vessel loops and clamps.  There was a palpable thrill in the venous outflow, and there was a palpable radial pulse.  At this point, I irrigated out the surgical wound.  There was no further active bleeding.  The subcutaneous tissue was reapproximated with a running stitch of 3-0 Vicryl.  The skin was then reapproximated with a running subcuticular stitch of 4-0 Vicryl.  The skin was then cleaned, dried, and reinforced with Dermabond.  The patient tolerated this procedure well.    COMPLICATIONS: none  CONDITION: stable  Adele Barthel, MD [facility] Office: [phone] Pager: [phone]  07/05/2014, 2:14 PM

## 2014-07-05 NOTE — Transfer of Care (Signed)
Immediate Anesthesia Transfer of Care Note  Patient: Blake [name]  Procedure(s) Performed: Procedure(s): INSERTION OF DIALYSIS CATHETER-LEFT INTERNAL JUGULAR PLACEMENT (Left) ARTERIOVENOUS BRACHIALCEPHALIC (AV) FISTULA CREATION (Right) REMOVAL OF A DIALYSIS CATHETER (Right)  Patient Location: PACU  Anesthesia Type:MAC  Level of Consciousness: awake, alert , oriented and patient cooperative  Airway & Oxygen Therapy: Patient Spontanous Breathing and Patient connected to face mask oxygen  Post-op Assessment: Report given to PACU RN and Post -op Vital signs reviewed and stable  Post vital signs: Reviewed and stable  Complications: No apparent anesthesia complications

## 2014-07-06 DIAGNOSIS — N183 Chronic kidney disease, stage 3 (moderate): Secondary | ICD-10-CM

## 2014-07-06 DIAGNOSIS — N182 Chronic kidney disease, stage 2 (mild): Secondary | ICD-10-CM

## 2014-07-06 DIAGNOSIS — E1122 Type 2 diabetes mellitus with diabetic chronic kidney disease: Secondary | ICD-10-CM

## 2014-07-06 LAB — CBC
HEMATOCRIT: 26.8 % — AB (ref 39.0–52.0)
Hemoglobin: 8.6 g/dL — ABNORMAL LOW (ref 13.0–17.0)
MCH: 26.7 pg (ref 26.0–34.0)
MCHC: 32.1 g/dL (ref 30.0–36.0)
MCV: 83.2 fL (ref 78.0–100.0)
PLATELETS: 219 10*3/uL (ref 150–400)
RBC: 3.22 MIL/uL — ABNORMAL LOW (ref 4.22–5.81)
RDW: 13.7 % (ref 11.5–15.5)
WBC: 13.6 10*3/uL — AB (ref 4.0–10.5)

## 2014-07-06 LAB — BASIC METABOLIC PANEL
ANION GAP: 21 — AB (ref 5–15)
BUN: 54 mg/dL — ABNORMAL HIGH (ref 6–23)
CALCIUM: 7.4 mg/dL — AB (ref 8.4–10.5)
CO2: 18 meq/L — AB (ref 19–32)
Chloride: 95 mEq/L — ABNORMAL LOW (ref 96–112)
Creatinine, Ser: 9.64 mg/dL — ABNORMAL HIGH (ref 0.50–1.35)
GFR calc non Af Amer: 5 mL/min — ABNORMAL LOW (ref >90)
GFR, EST AFRICAN AMERICAN: 6 mL/min — AB (ref >90)
Glucose, Bld: 237 mg/dL — ABNORMAL HIGH (ref 70–99)
Potassium: 4.2 mEq/L (ref 3.7–5.3)
SODIUM: 134 meq/L — AB (ref 137–147)

## 2014-07-06 LAB — GLUCOSE, CAPILLARY
GLUCOSE-CAPILLARY: 241 mg/dL — AB (ref 70–99)
GLUCOSE-CAPILLARY: 285 mg/dL — AB (ref 70–99)
Glucose-Capillary: 110 mg/dL — ABNORMAL HIGH (ref 70–99)

## 2014-07-06 MED ORDER — DARBEPOETIN ALFA-POLYSORBATE 60 MCG/0.3ML IJ SOLN
INTRAMUSCULAR | Status: AC
  Filled 2014-07-06: qty 0.3

## 2014-07-06 NOTE — Procedures (Signed)
Tolerating hemodialysis with no hemodynamic issues.  He is concerned about his weakness and inability to go home. Blake [name] C

## 2014-07-06 NOTE — Progress Notes (Signed)
[facility] TEAM 1 Transfer Progress Note  LEONID BAISE [id number] DOB: 12/24/1945 DOA: 06/28/2014 PCP: [name], MD  Admit HPI / Brief Narrative: 68 y/o M, non-smoker, with PMH of HTN, HLD, DM, and CKD with recent work up by Dr. Oneida Alar for AVF placement and planned start of HD in the near future who presented to [facility] ER on 10/15 with complaints of worsening shortness of breath for a week, or more.  He was seen in office by Dr. Oneida Alar on 10/15 for review of potential AVF placement. Office notes at that time reflect he was 84% on RA. Per notes, he was planned to have a graft placed on 10/28.   On presentation to the ER, he was noted to have significant shortness of breath and saturations of 47%. He was placed on BiPAP for increased work of breathing. CXR was concerning for R sided airspace disease (edema vs infiltrate).   HPI/Subjective: No complaints, generalized weakness  Assessment/Plan:  Acute Hypoxemic Respiratory Failure  - pulm edema + possible CAP  -appears to be stabilizing w/ ongoing HD - O2 requirement decreasing - nonetheless, WBC remains elevated -Wean O2 -Doubt pneumonia, stopped Vancomycin -Stopped Zosyn 10/23 and changed to PO Augmentin for 26more days  Acute on CKD HD initiated during this admission - temp trialysis catheter  -s/p temporary HD cath RUE AVF 10/23- creatinine in July 2012 was 1.9 - crt 2.17 Oct 2012  Acute Encephalopathy - in the setting of uremia / AKI Appears to be improving steadily - B12 and folate normal   Anemia of chronic kidney disease Fe indices c/w deficiency - epo + Fe per Nephrology   Hyperkalemia resolved w/ HD    DM2 w/ renal complications  Overall trend is of improvement - A1c 6.0  HTN BP well controlled at present   HLD LDL at goal   Obesity - Body mass index is 33.36 kg/(m^2).  Code Status: FULL Family Communication: no family present at time of exam Disposition Plan: SNF early next week, pending CLIP for  HD   Consultants: Vasc Surgery  PCCM > [facility]  Nephrology    Procedures: 10/16 - temp HD catheter  Antibiotics: Vanc 10/16 > Ceftrx 10/16 >10/21 Azithro 10/16 > Zosyn 10/21 >  DVT prophylaxis: SQ heparin   Objective: Blood pressure 117/58, pulse 91, temperature 99 F (37.2 C), temperature source Oral, resp. rate 18, height 5\' 8"  (1.727 m), weight 99.5 kg (219 lb 5.7 oz), SpO2 100.00%.  Intake/Output Summary (Last 24 hours) at 07/06/14 1754 Last data filed at 07/06/14 1400  Gross per 24 hour  Intake    240 ml  Output   1537 ml  Net  -1297 ml   Exam: General: AAOx3, no distress Lungs: CTA th/o w no wheeze  Cardiovascular: Regular rate and rhythm without murmur gallop or rub  Abdomen: Nontender, nondistended, soft, bowel sounds positive, no rebound, no ascites, no appreciable mass Extremities: No significant cyanosis, clubbing;  trace edema bilateral lower extremities  Data Reviewed: Basic Metabolic Panel:  Recent Labs Lab 06/30/14 0250 07/01/14 0444 07/02/14 0245 07/03/14 0212 07/05/14 0455 07/06/14 0404  NA 137 137 139 134* 135* 134*  K 4.2 4.9 4.2 4.0 4.1 4.2  CL 96 95* 97 92* 95* 95*  CO2 22 21 22 24 21  18*  GLUCOSE 105* 162* 91 220* 189* 237*  BUN 48* 65* 32* 29* 45* 54*  CREATININE 6.55* 8.90* 6.06* 5.81* 7.98* 9.64*  CALCIUM 7.9* 7.8* 8.2* 7.9* 7.6* 7.4*  MG  --   --   --  2.1  --   --   PHOS 7.4*  --   --   --   --   --     Liver Function Tests:  Recent Labs Lab 06/30/14 0250 07/02/14 0245 07/03/14 0212  AST  --  20 44*  ALT  --  16 36  ALKPHOS  --  114 130*  BILITOT  --  0.3 0.2*  PROT  --  7.9 8.0  ALBUMIN 2.0* 2.0* 2.2*    Coags:  Recent Labs Lab 07/02/14 0245  INR 1.33    Recent Labs Lab 07/02/14 0245  APTT 34    CBC:  Recent Labs Lab 07/01/14 0444 07/02/14 0245 07/03/14 0212 07/06/14 0404  WBC 13.1* 12.2* 12.3* 13.6*  NEUTROABS  --   --  8.7*  --   HGB 8.7* 9.6* 10.1* 8.6*  HCT 27.0* 31.2* 31.4* 26.8*  MCV  83.9 86.0 86.3 83.2  PLT 312 282 253 219    Cardiac Enzymes: No results found for this basename: CKTOTAL, CKMB, CKMBINDEX, TROPONINI,  in the last 168 hours BNP (last 3 results)  Recent Labs  06/28/14 0755  PROBNP 1556.0*    CBG:  Recent Labs Lab 07/05/14 1538 07/05/14 1720 07/05/14 2026 07/06/14 1226 07/06/14 1638  GLUCAP 175* 160* 226* 110* 241*    Recent Results (from the past 240 hour(s))  CULTURE, BLOOD (ROUTINE X 2)     Status: None   Collection Time    06/28/14  9:45 AM      Result Value Ref Range Status   Specimen Description BLOOD LEFT HAND   Final   Special Requests BOTTLES DRAWN AEROBIC AND ANAEROBIC 10ML   Final   Culture  Setup Time     Final   Value: 06/28/2014 17:15     Performed at [facility]     Final   Value: NO GROWTH 5 DAYS     Performed at [facility]   Report Status 07/04/2014 FINAL   Final  CULTURE, BLOOD (ROUTINE X 2)     Status: None   Collection Time    06/28/14 10:00 AM      Result Value Ref Range Status   Specimen Description BLOOD LEFT ARM   Final   Special Requests BOTTLES DRAWN AEROBIC AND ANAEROBIC 10ML   Final   Culture  Setup Time     Final   Value: 06/28/2014 17:14     Performed at [facility]   Culture     Final   Value: NO GROWTH 5 DAYS     Performed at [facility]   Report Status 07/04/2014 FINAL   Final  MRSA PCR SCREENING     Status: None   Collection Time    06/28/14  1:57 PM      Result Value Ref Range Status   MRSA by PCR NEGATIVE  NEGATIVE Final   Comment:            The GeneXpert MRSA Assay (FDA     approved for NASAL specimens     only), is one component of a     comprehensive MRSA colonization     surveillance program. It is not     intended to diagnose MRSA     infection nor to guide or     monitor treatment for     MRSA infections.  URINE CULTURE     Status: None   Collection Time    06/28/14  2:17 PM      Result Value Ref Range Status   Specimen  Description URINE, CLEAN CATCH   Final   Special Requests NONE   Final   Culture  Setup Time     Final   Value: 06/28/2014 21:00     Performed at [facility] Count     Final   Value: NO GROWTH     Performed at [facility]   Culture     Final   Value: NO GROWTH     Performed at [facility]   Report Status 06/29/2014 FINAL   Final  CLOSTRIDIUM DIFFICILE BY PCR     Status: None   Collection Time    07/04/14  3:06 PM      Result Value Ref Range Status   C difficile by pcr NEGATIVE  NEGATIVE Final     Studies:  Recent x-ray studies have been reviewed in detail by the Attending Physician  Scheduled Meds:  Scheduled Meds: . amoxicillin-clavulanate  1 tablet Oral Q24H  . aspirin EC  81 mg Oral Daily  . atorvastatin  20 mg Oral q1800  . buPROPion  300 mg Oral Daily  . calcitRIOL  0.25 mcg Oral Daily  . darbepoetin (ARANESP) injection - DIALYSIS  60 mcg Intravenous Q Sat-HD  . feeding supplement (NEPRO CARB STEADY)  237 mL Oral BID BM  . heparin  5,000 Units Subcutaneous 3 times per day  . insulin aspart  0-15 Units Subcutaneous TID WC  . insulin aspart  0-5 Units Subcutaneous QHS  . insulin aspart  4 Units Subcutaneous TID WC  . insulin glargine  20 Units Subcutaneous QHS  . pantoprazole  40 mg Oral Daily    Time spent on care of this patient: 89 mins   Domenic Polite , MD   [facility] Office  [phone] Pager - Text Page per Shea Evans as per below:  On-Call/Text Page:      [email or web address]      password [email or web address]  If 7PM-7AM, please contact night-coverage [email or web address] Password [email or web address] 07/06/2014, 5:54 PM   LOS: 8 days

## 2014-07-06 NOTE — Progress Notes (Signed)
   Daily Progress Note  Assessment/Planning: POD #1 s/p [facility] TDC, R BC AVF   Pt baseline preop has numbness in R hand  No obvious signs of steal  Follow up in office in 6 weeks  Subjective  - 1 Day Post-Op  No complaints  Objective Filed Vitals:   07/06/14 0800 07/06/14 0807 07/06/14 0830 07/06/14 0901  BP: 124/61 109/64 115/61 118/64  Pulse: 85 83 92 91  Temp:      TempSrc:      Resp: 18 18 16 19   Height:      Weight:      SpO2:        Intake/Output Summary (Last 24 hours) at 07/06/14 0904 Last data filed at 07/05/14 1900  Gross per 24 hour  Intake    840 ml  Output    110 ml  Net    730 ml    PULM  [facility] TDC connected to HD machine and running in green, pcxr: no PTX VASC  R arm: inc c/d/i, +thrill, +bruit, R hand warm, palpable radial, hand grip 5/5, mild numbness in fingers  Laboratory CBC    Component Value Date/Time   WBC 13.6* 07/06/2014 0404   HGB 8.6* 07/06/2014 0404   HCT 26.8* 07/06/2014 0404   PLT 219 07/06/2014 0404    BMET    Component Value Date/Time   NA 134* 07/06/2014 0404   K 4.2 07/06/2014 0404   CL 95* 07/06/2014 0404   CO2 18* 07/06/2014 0404   GLUCOSE 237* 07/06/2014 0404   BUN 54* 07/06/2014 0404   CREATININE 9.64* 07/06/2014 0404   CALCIUM 7.4* 07/06/2014 0404   GFRNONAA 5* 07/06/2014 0404   GFRAA 6* 07/06/2014 0404    Adele Barthel, MD [facility]: [phone] Pager: [phone]  07/06/2014, 9:04 AM

## 2014-07-06 NOTE — Progress Notes (Signed)
Patient ID: Blake [name], male    DOB: Sep 23, 1945, 68 y.o.   MRN: [id number] Nephrology Progress Note  Assessment/Plan: 68 y.o. male with progressive CKD, DM, HTN; admitted for worsening SOB with Scr 10.2 and K of 5.6. HD started in hospital.  1. 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 UF 1.5L 10/23. Next HD today. Has outpt HD setup TTS. 2. Vascular access: left IJ TDC. RUE brachiocephalic AVF 3. Pulmonary- Cont with UF and treatment of PNA. No bipap req, on [place]. 4. SHPTH: Phos 7.4, Ca 7.9, iPTH 535.  On calcitriol, may need to start phosphate binders once taking po 5. ACD: weekly aranesp  6. AG metabolic acidosis: resolved with HD  7. Hyperkalemia: resolved with HD  8. DM: per primary  9. HTN: discontinued amlodipine for better toleration of UF with HD 10. CAP: on augmentin 11. Dispo: outpt HD setup for TTS; PT recommending SNF  Renal Attending: As Above.  He is currently on dialysis.  He reports he cannot walk due to weakness and cannot go home. Melvia Matousek C   _________________________________ S: Breathing okay. States he is unable to walk currently, doesn't think he can go home.  O:BP 124/61  Pulse 85  Temp(Src) 98.3 F (36.8 C) (Oral)  Resp 18  Ht 5\' 8"  (1.727 m)  Wt 223 lb 1.7 oz (101.2 kg)  BMI 33.93 kg/m2  SpO2 95%  Intake/Output Summary (Last 24 hours) at 07/06/14 0807 Last data filed at 07/05/14 1900  Gross per 24 hour  Intake   1080 ml  Output    110 ml  Net    970 ml   Intake/Output: I/O last 3 completed shifts: In: 1400 [P.O.:600; I.V.:600; IV Piggyback:200] Out: 110 [Urine:50; Blood:60]  Intake/Output this shift:    Weight change:   Gen: NAD CVS: RRR, normal heart sounds, no murmurs. Right AVF +thrill, +bruit Resp: clear anteriorly, Nasal canula in place. Abd: soft, +BS Ext: no edema Neuro: alert/oriented, grossly normal   Recent Labs Lab 06/30/14 0250 07/01/14 0444 07/02/14 0245 07/03/14 0212 07/05/14 0455  07/06/14 0404  NA 137 137 139 134* 135* 134*  K 4.2 4.9 4.2 4.0 4.1 4.2  CL 96 95* 97 92* 95* 95*  CO2 22 21 22 24 21  18*  GLUCOSE 105* 162* 91 220* 189* 237*  BUN 48* 65* 32* 29* 45* 54*  CREATININE 6.55* 8.90* 6.06* 5.81* 7.98* 9.64*  ALBUMIN 2.0*  --  2.0* 2.2*  --   --   CALCIUM 7.9* 7.8* 8.2* 7.9* 7.6* 7.4*  PHOS 7.4*  --   --   --   --   --   AST  --   --  20 44*  --   --   ALT  --   --  16 36  --   --    Liver Function Tests:  Recent Labs Lab 06/30/14 0250 07/02/14 0245 07/03/14 0212  AST  --  20 44*  ALT  --  16 36  ALKPHOS  --  114 130*  BILITOT  --  0.3 0.2*  PROT  --  7.9 8.0  ALBUMIN 2.0* 2.0* 2.2*   No results found for this basename: LIPASE, AMYLASE,  in the last 168 hours No results found for this basename: AMMONIA,  in the last 168 hours CBC:  Recent Labs Lab 07/01/14 0444 07/02/14 0245 07/03/14 0212 07/06/14 0404  WBC 13.1* 12.2* 12.3* 13.6*  NEUTROABS  --   --  8.7*  --  HGB 8.7* 9.6* 10.1* 8.6*  HCT 27.0* 31.2* 31.4* 26.8*  MCV 83.9 86.0 86.3 83.2  PLT 312 282 253 219   Cardiac Enzymes: No results found for this basename: CKTOTAL, CKMB, CKMBINDEX, TROPONINI,  in the last 168 hours CBG:  Recent Labs Lab 07/05/14 0757 07/05/14 1142 07/05/14 1538 07/05/14 1720 07/05/14 2026  GLUCAP 169* 169* 175* 160* 226*    Iron Studies:  No results found for this basename: IRON, TIBC, TRANSFERRIN, FERRITIN,  in the last 72 hours Studies/Results: Dg Chest Port 1 View  07/05/2014   CLINICAL DATA:  End-stage renal disease. Dialysis catheter placed today.  EXAM: PORTABLE CHEST - 1 VIEW  COMPARISON:  Chest radiograph 07/05/2014 at 15:50 hr.  FINDINGS: Current radiograph is at 17:26 hr. Left IJ dialysis catheter remains in appropriate position, with 1 of the limbs at the junction of superior vena cava and right atrium and a second limb projecting over the right atrium. Cardiomediastinal silhouette is stable. There is diffuse pulmonary vascular congestion.  There are bilateral interstitial can't airspace opacities, slightly greater on the right than left, that are without significant change compared to the chest radiograph performed earlier today. No visible pleural effusion by portable technique. Negative for pneumothorax.  IMPRESSION: No significant change in pulmonary vascular congestion and bilateral interstitial and airspace opacities suggestive of volume overload/pulmonary edema.  Satisfactory position of dialysis catheter.   Electronically Signed   By: Curlene Dolphin M.D.   On: 07/05/2014 17:49   Dg Chest Port 1 View  07/05/2014   CLINICAL DATA:  Postoperative chest radiograph following surgery.  EXAM: PORTABLE CHEST - 1 VIEW  COMPARISON:  07/02/2014.  FINDINGS: Cardiomegaly is present. LEFT IJ dialysis catheter with the distal tip in the RIGHT atrium. RIGHT-greater-than-LEFT airspace disease is present, likely representing volume overload and pulmonary edema. Lung volumes are low. No pneumothorax. Monitoring leads project over the chest.  IMPRESSION: New LEFT IJ dialysis catheter with the distal tip in the RIGHT atrium. RIGHT-greater-than-LEFT airspace disease likely representing volume overload.   Electronically Signed   By: Dereck Ligas M.D.   On: 07/05/2014 16:11   Dg Fluoro Guide Cv Line-no Report  07/05/2014   CLINICAL DATA:    FLOURO GUIDE CV LINE  Fluoroscopy was utilized by the requesting physician.  No radiographic  interpretation.    Marland Kitchen amoxicillin-clavulanate  1 tablet Oral Q24H  . aspirin EC  81 mg Oral Daily  . atorvastatin  20 mg Oral q1800  . buPROPion  300 mg Oral Daily  . calcitRIOL  0.25 mcg Oral Daily  . darbepoetin (ARANESP) injection - DIALYSIS  60 mcg Intravenous Q Sat-HD  . feeding supplement (NEPRO CARB STEADY)  237 mL Oral BID BM  . heparin  5,000 Units Subcutaneous 3 times per day  . insulin aspart  0-15 Units Subcutaneous TID WC  . insulin aspart  0-5 Units Subcutaneous QHS  . insulin aspart  4 Units Subcutaneous  TID WC  . insulin glargine  20 Units Subcutaneous QHS  . pantoprazole  40 mg Oral Daily    BMET    Component Value Date/Time   NA 134* 07/06/2014 0404   K 4.2 07/06/2014 0404   CL 95* 07/06/2014 0404   CO2 18* 07/06/2014 0404   GLUCOSE 237* 07/06/2014 0404   BUN 54* 07/06/2014 0404   CREATININE 9.64* 07/06/2014 0404   CALCIUM 7.4* 07/06/2014 0404   GFRNONAA 5* 07/06/2014 0404   GFRAA 6* 07/06/2014 0404   CBC    Component  Value Date/Time   WBC 13.6* 07/06/2014 0404   RBC 3.22* 07/06/2014 0404   RBC 3.63* 07/02/2014 0245   HGB 8.6* 07/06/2014 0404   HCT 26.8* 07/06/2014 0404   PLT 219 07/06/2014 0404   MCV 83.2 07/06/2014 0404   MCH 26.7 07/06/2014 0404   MCHC 32.1 07/06/2014 0404   RDW 13.7 07/06/2014 0404   LYMPHSABS 1.3 07/03/2014 0212   MONOABS 1.7* 07/03/2014 0212   EOSABS 0.6 07/03/2014 0212   BASOSABS 0.0 07/03/2014 0212     Tawanna Sat

## 2014-07-07 DIAGNOSIS — F32A Depression, unspecified: Secondary | ICD-10-CM | POA: Diagnosis present

## 2014-07-07 DIAGNOSIS — F329 Major depressive disorder, single episode, unspecified: Secondary | ICD-10-CM | POA: Diagnosis present

## 2014-07-07 DIAGNOSIS — Z7189 Other specified counseling: Secondary | ICD-10-CM

## 2014-07-07 DIAGNOSIS — G5793 Unspecified mononeuropathy of bilateral lower limbs: Secondary | ICD-10-CM | POA: Diagnosis present

## 2014-07-07 DIAGNOSIS — N186 End stage renal disease: Secondary | ICD-10-CM

## 2014-07-07 DIAGNOSIS — E1122 Type 2 diabetes mellitus with diabetic chronic kidney disease: Secondary | ICD-10-CM

## 2014-07-07 DIAGNOSIS — Z992 Dependence on renal dialysis: Secondary | ICD-10-CM

## 2014-07-07 DIAGNOSIS — E11319 Type 2 diabetes mellitus with unspecified diabetic retinopathy without macular edema: Secondary | ICD-10-CM | POA: Diagnosis present

## 2014-07-07 LAB — GLUCOSE, CAPILLARY
Glucose-Capillary: 258 mg/dL — ABNORMAL HIGH (ref 70–99)
Glucose-Capillary: 240 mg/dL — ABNORMAL HIGH (ref 70–99)
Glucose-Capillary: 332 mg/dL — ABNORMAL HIGH (ref 70–99)
Glucose-Capillary: 209 mg/dL — ABNORMAL HIGH (ref 70–99)

## 2014-07-07 MED ORDER — DULOXETINE HCL 20 MG PO CPEP
20.0000 mg | ORAL_CAPSULE | Freq: Every day | ORAL | Status: DC
  Administered 2014-07-07 – 2014-07-09 (×3): 20 mg via ORAL
  Filled 2014-07-07 (×3): qty 1

## 2014-07-07 MED ORDER — INSULIN GLARGINE 100 UNIT/ML ~~LOC~~ SOLN
30.0000 [IU] | Freq: Every day | SUBCUTANEOUS | Status: DC
  Administered 2014-07-07 – 2014-07-08 (×2): 30 [IU] via SUBCUTANEOUS
  Filled 2014-07-07 (×3): qty 0.3

## 2014-07-07 MED ORDER — BUPROPION HCL ER (XL) 150 MG PO TB24
150.0000 mg | ORAL_TABLET | Freq: Every day | ORAL | Status: DC
  Administered 2014-07-08 – 2014-07-09 (×2): 150 mg via ORAL
  Filled 2014-07-07 (×2): qty 1

## 2014-07-07 NOTE — Progress Notes (Signed)
Patient ID: Blake [name], male    DOB: 1945/11/16, 68 y.o.   MRN: [id number] Nephrology Progress Note  Assessment/Plan: 68 y.o. male with progressive CKD, DM, HTN; admitted for worsening SOB with Scr 10.2 and K of 5.6. HD started in hospital.  1. 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 UF 1.3L 10/24. Has outpt HD setup TTS. 2. Vascular access: left IJ TDC. RUE brachiocephalic AVF 3. SHPTH: Phos 7.4, Ca 7.9, iPTH 535.  On calcitriol, may need to start phosphate binders once taking po 4. ACD: weekly aranesp  5. AG metabolic acidosis: resolved with HD  6. Hyperkalemia: resolved with HD  7. DM: per primary  8. HTN: discontinued amlodipine for better toleration of UF with HD. Still stable/low 110-120s/40-60s. 9. CAP: on augmentin 10. Dispo: outpt HD setup for TTS; PT recommending SNF   Renal Attending: He has a spot at [facility] on TTS.  He is awaiting SNF Erine [place]ix C   _________________________________ S: No current complaints.  O:BP 122/56  Pulse 85  Temp(Src) 98.8 F (37.1 C) (Oral)  Resp 18  Ht 5\' 8"  (1.727 m)  Wt 219 lb 5.7 oz (99.499 kg)  BMI 33.36 kg/m2  SpO2 98%  Intake/Output Summary (Last 24 hours) at 07/07/14 0817 Last data filed at 07/07/14 0700  Gross per 24 hour  Intake    600 ml  Output   1487 ml  Net   -887 ml   Intake/Output: I/O last 3 completed shifts: In: 600 [P.O.:600] Out: [id number] [Urine:100; [id number]  Intake/Output this shift:    Weight change:   Gen: NAD CVS: RRR, normal heart sounds, no murmurs. Right AVF +thrill, +bruit Resp: clear anteriorly, Nasal canula in place. Abd: soft, +BS Ext: no edema Neuro: alert/oriented, grossly normal   Recent Labs Lab 07/01/14 0444 07/02/14 0245 07/03/14 0212 07/05/14 0455 07/06/14 0404  NA 137 139 134* 135* 134*  K 4.9 4.2 4.0 4.1 4.2  CL 95* 97 92* 95* 95*  CO2 21 22 24 21  18*  GLUCOSE 162* 91 220* 189* 237*  BUN 65* 32* 29* 45* 54*  CREATININE 8.90* 6.06* 5.81* 7.98* 9.64*   ALBUMIN  --  2.0* 2.2*  --   --   CALCIUM 7.8* 8.2* 7.9* 7.6* 7.4*  AST  --  20 44*  --   --   ALT  --  16 36  --   --    Liver Function Tests:  Recent Labs Lab 07/02/14 0245 07/03/14 0212  AST 20 44*  ALT 16 36  ALKPHOS 114 130*  BILITOT 0.3 0.2*  PROT 7.9 8.0  ALBUMIN 2.0* 2.2*   No results found for this basename: LIPASE, AMYLASE,  in the last 168 hours No results found for this basename: AMMONIA,  in the last 168 hours CBC:  Recent Labs Lab 07/01/14 0444 07/02/14 0245 07/03/14 0212 07/06/14 0404  WBC 13.1* 12.2* 12.3* 13.6*  NEUTROABS  --   --  8.7*  --   HGB 8.7* 9.6* 10.1* 8.6*  HCT 27.0* 31.2* 31.4* 26.8*  MCV 83.9 86.0 86.3 83.2  PLT 312 282 253 219   Cardiac Enzymes: No results found for this basename: CKTOTAL, CKMB, CKMBINDEX, TROPONINI,  in the last 168 hours CBG:  Recent Labs Lab 07/05/14 1720 07/05/14 2026 07/06/14 1226 07/06/14 1638 07/06/14 2052  GLUCAP 160* 226* 110* 241* 285*    Iron Studies:  No results found for this basename: IRON, TIBC, TRANSFERRIN, FERRITIN,  in the last 72 hours  Studies/Results: Dg Chest Port 1 View  07/05/2014   CLINICAL DATA:  End-stage renal disease. Dialysis catheter placed today.  EXAM: PORTABLE CHEST - 1 VIEW  COMPARISON:  Chest radiograph 07/05/2014 at 15:50 hr.  FINDINGS: Current radiograph is at 17:26 hr. Left IJ dialysis catheter remains in appropriate position, with 1 of the limbs at the junction of superior vena cava and right atrium and a second limb projecting over the right atrium. Cardiomediastinal silhouette is stable. There is diffuse pulmonary vascular congestion. There are bilateral interstitial can't airspace opacities, slightly greater on the right than left, that are without significant change compared to the chest radiograph performed earlier today. No visible pleural effusion by portable technique. Negative for pneumothorax.  IMPRESSION: No significant change in pulmonary vascular congestion and  bilateral interstitial and airspace opacities suggestive of volume overload/pulmonary edema.  Satisfactory position of dialysis catheter.   Electronically Signed   By: Curlene Dolphin M.D.   On: 07/05/2014 17:49   Dg Chest Port 1 View  07/05/2014   CLINICAL DATA:  Postoperative chest radiograph following surgery.  EXAM: PORTABLE CHEST - 1 VIEW  COMPARISON:  07/02/2014.  FINDINGS: Cardiomegaly is present. LEFT IJ dialysis catheter with the distal tip in the RIGHT atrium. RIGHT-greater-than-LEFT airspace disease is present, likely representing volume overload and pulmonary edema. Lung volumes are low. No pneumothorax. Monitoring leads project over the chest.  IMPRESSION: New LEFT IJ dialysis catheter with the distal tip in the RIGHT atrium. RIGHT-greater-than-LEFT airspace disease likely representing volume overload.   Electronically Signed   By: Dereck Ligas M.D.   On: 07/05/2014 16:11   Dg Fluoro Guide Cv Line-no Report  07/05/2014   CLINICAL DATA:    FLOURO GUIDE CV LINE  Fluoroscopy was utilized by the requesting physician.  No radiographic  interpretation.    Marland Kitchen amoxicillin-clavulanate  1 tablet Oral Q24H  . aspirin EC  81 mg Oral Daily  . atorvastatin  20 mg Oral q1800  . buPROPion  300 mg Oral Daily  . calcitRIOL  0.25 mcg Oral Daily  . darbepoetin (ARANESP) injection - DIALYSIS  60 mcg Intravenous Q Sat-HD  . feeding supplement (NEPRO CARB STEADY)  237 mL Oral BID BM  . heparin  5,000 Units Subcutaneous 3 times per day  . insulin aspart  0-15 Units Subcutaneous TID WC  . insulin aspart  0-5 Units Subcutaneous QHS  . insulin aspart  4 Units Subcutaneous TID WC  . insulin glargine  20 Units Subcutaneous QHS  . pantoprazole  40 mg Oral Daily    BMET    Component Value Date/Time   NA 134* 07/06/2014 0404   K 4.2 07/06/2014 0404   CL 95* 07/06/2014 0404   CO2 18* 07/06/2014 0404   GLUCOSE 237* 07/06/2014 0404   BUN 54* 07/06/2014 0404   CREATININE 9.64* 07/06/2014 0404   CALCIUM  7.4* 07/06/2014 0404   GFRNONAA 5* 07/06/2014 0404   GFRAA 6* 07/06/2014 0404   CBC    Component Value Date/Time   WBC 13.6* 07/06/2014 0404   RBC 3.22* 07/06/2014 0404   RBC 3.63* 07/02/2014 0245   HGB 8.6* 07/06/2014 0404   HCT 26.8* 07/06/2014 0404   PLT 219 07/06/2014 0404   MCV 83.2 07/06/2014 0404   MCH 26.7 07/06/2014 0404   MCHC 32.1 07/06/2014 0404   RDW 13.7 07/06/2014 0404   LYMPHSABS 1.3 07/03/2014 0212   MONOABS 1.7* 07/03/2014 0212   EOSABS 0.6 07/03/2014 0212   BASOSABS 0.0 07/03/2014 [id number]  Tawanna Sat

## 2014-07-07 NOTE — Progress Notes (Signed)
[facility] TEAM 1 Transfer Progress Note  MCCLINTON TEH [id number] DOB: 1946-06-27 DOA: 06/28/2014 PCP: [name], MD  Admit HPI / Brief Narrative: 68 y/o M, non-smoker, with PMH of HTN, HLD, DM, and CKD with recent work up by Dr. Oneida Alar for AVF placement and planned start of HD in the near future who presented to [facility] ER on 10/15 with complaints of worsening shortness of breath for a week, or more.  He was seen in office by Dr. Oneida Alar on 10/15 for review of potential AVF placement. Office notes at that time reflect he was 84% on RA. Per notes, he was planned to have a graft placed on 10/28.   On presentation to the ER, he was noted to have significant shortness of breath and saturations of 47%. He was placed on BiPAP for increased work of breathing. CXR was concerning for R sided airspace disease (edema vs infiltrate).   HPI/Subjective: No complaints, agreeable to SNF  Assessment/Plan:  Acute Hypoxemic Respiratory Failure  - pulm edema + possible CAP  -appears to be improving w/ ongoing HD  - O2 requirement decreasing - nonetheless, WBC remains elevated -Wean offO2 -Doubt pneumonia, stopped Vancomycin -Stopped Zosyn 10/23 and changed to PO Augmentin, continue for 4more days  Acute on CKD HD initiated during this admission - temp trialysis catheter  -s/p temporary HD cath RUE AVF 10/23- creatinine in July 2012 was 1.9 - crt 2.17 Oct 2012 -now ESRD -awaiting CLIP for HD  Acute Encephalopathy - in the setting of uremia / AKI Appears to be improving steadily - B12 and folate normal   Anemia of chronic kidney disease Fe indices c/w deficiency - epo + Fe per Nephrology   Hyperkalemia resolved w/ HD    DM2 w/ renal complications  Overall trend is of improvement - A1c 6.0  HTN BP well controlled at present   HLD LDL at goal   Obesity - Body mass index is 33.36 kg/(m^2).  Code Status: FULL Family Communication: no family present at time of exam Disposition  Plan: SNF early next week, pending CLIP for HD   Consultants: Vasc Surgery  PCCM > [facility]  Nephrology    Procedures: 10/16 - temp HD catheter  Antibiotics: Vanc 10/16 > Ceftrx 10/16 >10/21 Azithro 10/16 > Zosyn 10/21 >  DVT prophylaxis: SQ heparin   Objective: Blood pressure 123/50, pulse 74, temperature 97.4 F (36.3 C), temperature source Oral, resp. rate 19, height 5\' 8"  (1.727 m), weight 99.499 kg (219 lb 5.7 oz), SpO2 100.00%.  Intake/Output Summary (Last 24 hours) at 07/07/14 1138 Last data filed at 07/07/14 0700  Gross per 24 hour  Intake    600 ml  Output   1487 ml  Net   -887 ml   Exam: General: AAOx3, no distress Lungs: CTA th/o w no wheeze  Cardiovascular: Regular rate and rhythm without murmur gallop or rub  Abdomen: Nontender, nondistended, soft, bowel sounds positive, no rebound, no ascites, no appreciable mass Extremities: No significant cyanosis, clubbing;  trace edema bilateral lower extremities  Data Reviewed: Basic Metabolic Panel:  Recent Labs Lab 07/01/14 0444 07/02/14 0245 07/03/14 0212 07/05/14 0455 07/06/14 0404  NA 137 139 134* 135* 134*  K 4.9 4.2 4.0 4.1 4.2  CL 95* 97 92* 95* 95*  CO2 21 22 24 21  18*  GLUCOSE 162* 91 220* 189* 237*  BUN 65* 32* 29* 45* 54*  CREATININE 8.90* 6.06* 5.81* 7.98* 9.64*  CALCIUM 7.8* 8.2* 7.9* 7.6* 7.4*  MG  --   --  2.1  --   --     Liver Function Tests:  Recent Labs Lab 07/02/14 0245 07/03/14 0212  AST 20 44*  ALT 16 36  ALKPHOS 114 130*  BILITOT 0.3 0.2*  PROT 7.9 8.0  ALBUMIN 2.0* 2.2*    Coags:  Recent Labs Lab 07/02/14 0245  INR 1.33    Recent Labs Lab 07/02/14 0245  APTT 34    CBC:  Recent Labs Lab 07/01/14 0444 07/02/14 0245 07/03/14 0212 07/06/14 0404  WBC 13.1* 12.2* 12.3* 13.6*  NEUTROABS  --   --  8.7*  --   HGB 8.7* 9.6* 10.1* 8.6*  HCT 27.0* 31.2* 31.4* 26.8*  MCV 83.9 86.0 86.3 83.2  PLT 312 282 253 219    Cardiac Enzymes: No results found for this  basename: CKTOTAL, CKMB, CKMBINDEX, TROPONINI,  in the last 168 hours BNP (last 3 results)  Recent Labs  06/28/14 0755  PROBNP 1556.0*    CBG:  Recent Labs Lab 07/05/14 2026 07/06/14 1226 07/06/14 1638 07/06/14 2052 07/07/14 0757  GLUCAP 226* 110* 241* 285* 258*    Recent Results (from the past 240 hour(s))  CULTURE, BLOOD (ROUTINE X 2)     Status: None   Collection Time    06/28/14  9:45 AM      Result Value Ref Range Status   Specimen Description BLOOD LEFT HAND   Final   Special Requests BOTTLES DRAWN AEROBIC AND ANAEROBIC 10ML   Final   Culture  Setup Time     Final   Value: 06/28/2014 17:15     Performed at [facility]     Final   Value: NO GROWTH 5 DAYS     Performed at [facility]   Report Status 07/04/2014 FINAL   Final  CULTURE, BLOOD (ROUTINE X 2)     Status: None   Collection Time    06/28/14 10:00 AM      Result Value Ref Range Status   Specimen Description BLOOD LEFT ARM   Final   Special Requests BOTTLES DRAWN AEROBIC AND ANAEROBIC 10ML   Final   Culture  Setup Time     Final   Value: 06/28/2014 17:14     Performed at [facility]   Culture     Final   Value: NO GROWTH 5 DAYS     Performed at [facility]   Report Status 07/04/2014 FINAL   Final  MRSA PCR SCREENING     Status: None   Collection Time    06/28/14  1:57 PM      Result Value Ref Range Status   MRSA by PCR NEGATIVE  NEGATIVE Final   Comment:            The GeneXpert MRSA Assay (FDA     approved for NASAL specimens     only), is one component of a     comprehensive MRSA colonization     surveillance program. It is not     intended to diagnose MRSA     infection nor to guide or     monitor treatment for     MRSA infections.  URINE CULTURE     Status: None   Collection Time    06/28/14  2:17 PM      Result Value Ref Range Status   Specimen Description URINE, CLEAN CATCH   Final   Special Requests NONE   Final   Culture  Setup  Time      Final   Value: 06/28/2014 21:00     Performed at [place]     Final   Value: NO GROWTH     Performed at [facility]   Culture     Final   Value: NO GROWTH     Performed at [facility]   Report Status 06/29/2014 FINAL   Final  CLOSTRIDIUM DIFFICILE BY PCR     Status: None   Collection Time    07/04/14  3:06 PM      Result Value Ref Range Status   C difficile by pcr NEGATIVE  NEGATIVE Final     Studies:  Recent x-ray studies have been reviewed in detail by the Attending Physician  Scheduled Meds:  Scheduled Meds: . amoxicillin-clavulanate  1 tablet Oral Q24H  . aspirin EC  81 mg Oral Daily  . atorvastatin  20 mg Oral q1800  . buPROPion  300 mg Oral Daily  . calcitRIOL  0.25 mcg Oral Daily  . darbepoetin (ARANESP) injection - DIALYSIS  60 mcg Intravenous Q Sat-HD  . feeding supplement (NEPRO CARB STEADY)  237 mL Oral BID BM  . heparin  5,000 Units Subcutaneous 3 times per day  . insulin aspart  0-15 Units Subcutaneous TID WC  . insulin aspart  0-5 Units Subcutaneous QHS  . insulin aspart  4 Units Subcutaneous TID WC  . insulin glargine  30 Units Subcutaneous QHS  . pantoprazole  40 mg Oral Daily    Time spent on care of this patient: 53 mins   Domenic Polite , MD   [facility] Office  [phone] Pager - Text Page per Shea Evans as per below:  On-Call/Text Page:      [email or web address]      password [email or web address]  If 7PM-7AM, please contact night-coverage [email or web address] Password [email or web address] 07/07/2014, 11:38 AM   LOS: 9 days

## 2014-07-07 NOTE — Progress Notes (Signed)
Will re-meet with patient and his sister [name] today at [facility] 10/25 to further discuss goals of care and solidify HCPOA. I met with [name] on 10/22 to complete HCPOA discussion but Kamarii was in HD, [name] has expressed to me her concern that Aayan will not be able/or choose to adhere to his treatment plan. Will try to support both of them since Aison plans to return to live with [name].   Lane Hacker, DO Palliative Medicine

## 2014-07-07 NOTE — Progress Notes (Signed)
Palliative Care Team at [place] Note   SUBJECTIVE: Capone is much better today. He is talkative and his spirits seem to be good. [name] and his nephew Darryl are at bedside. He is interested in completing HCPOA paperwork and was also open to a conversation about his advance directive planning. He denies pain but says his legs feel like "jello".  OBJECTIVE: Vital Signs: BP 123/50  Pulse 74  Temp(Src) 97.4 F (36.3 C) (Oral)  Resp 19  Ht 5\' 8"  (1.727 m)  Wt 99.499 kg (219 lb 5.7 oz)  BMI 33.36 kg/m2  SpO2 100%   Intake and Output: 10/24 0701 - 10/25 0700 In: 600 [P.O.:600] Out: 1487 [Urine:100]  Physical Exam:  No Known Allergies  Medications: Scheduled Meds:  . amoxicillin-clavulanate  1 tablet Oral Q24H  . aspirin EC  81 mg Oral Daily  . atorvastatin  20 mg Oral q1800  . buPROPion  300 mg Oral Daily  . calcitRIOL  0.25 mcg Oral Daily  . darbepoetin (ARANESP) injection - DIALYSIS  60 mcg Intravenous Q Sat-HD  . feeding supplement (NEPRO CARB STEADY)  237 mL Oral BID BM  . heparin  5,000 Units Subcutaneous 3 times per day  . insulin aspart  0-15 Units Subcutaneous TID WC  . insulin aspart  0-5 Units Subcutaneous QHS  . insulin aspart  4 Units Subcutaneous TID WC  . insulin glargine  30 Units Subcutaneous QHS  . pantoprazole  40 mg Oral Daily    Continuous Infusions:    PRN Meds: metoCLOPramide, morphine injection, oxyCODONE-acetaminophen, promethazine   Labs: CBC    Component Value Date/Time   WBC 13.6* 07/06/2014 0404   RBC 3.22* 07/06/2014 0404   RBC 3.63* 07/02/2014 0245   HGB 8.6* 07/06/2014 0404   HCT 26.8* 07/06/2014 0404   PLT 219 07/06/2014 0404   MCV 83.2 07/06/2014 0404   MCH 26.7 07/06/2014 0404   MCHC 32.1 07/06/2014 0404   RDW 13.7 07/06/2014 0404   LYMPHSABS 1.3 07/03/2014 0212   MONOABS 1.7* 07/03/2014 0212   EOSABS 0.6 07/03/2014 0212   BASOSABS 0.0 07/03/2014 0212    CMET     Component Value Date/Time   NA 134*  07/06/2014 0404   K 4.2 07/06/2014 0404   CL 95* 07/06/2014 0404   CO2 18* 07/06/2014 0404   GLUCOSE 237* 07/06/2014 0404   BUN 54* 07/06/2014 0404   CREATININE 9.64* 07/06/2014 0404   CALCIUM 7.4* 07/06/2014 0404   PROT 8.0 07/03/2014 0212   ALBUMIN 2.2* 07/03/2014 0212   AST 44* 07/03/2014 0212   ALT 36 07/03/2014 0212   ALKPHOS 130* 07/03/2014 0212   BILITOT 0.2* 07/03/2014 0212   GFRNONAA 5* 07/06/2014 0404   GFRAA 6* 07/06/2014 0404   ASSESSMENT/ PLAN: 68 yo with ESRD, new HD start, poorly controlled diabetes.PMT has followed for ongoing goals of care, symptom management and advance care planning.   He desires to name his sister [name] and his Nephew Darryl as formal HCPOAs  He does not want any form of futile life support, artificial feeding or to be sustained in a place of total care should his condition deteriorate.   He wants full code for now, but only if there is the possibility of recovery- ex. Short term intubation for pulmonary edema, cardioversion for arrythmia.  No SM needs at this time  Recommend maintaining him on the Cymbalta-he seems to have tolerated this very well and may help with his neuropathic leg pain.  He will  need diabetes education and management.  Recommend placing a [facility] care management outpatient consult once he discharges back home.  He is much stronger today- OOB? If CIR may be a good option for short term rehab. He has a good support system at home with his sister and nephew helping him out.  Will need transportation to HD setup once he returns home.   25 min. 3:15PM-3:335PM Greater than 50%  of this time was spent counseling and coordinating care related to the above assessment and plan.   Acquanetta Chain, DO  07/07/2014, 3:41 PM  Please contact Palliative Medicine Team phone at [phone] for questions and concerns.

## 2014-07-08 ENCOUNTER — Inpatient Hospital Stay (HOSPITAL_COMMUNITY): Payer: Medicare Other

## 2014-07-08 ENCOUNTER — Telehealth: Payer: Self-pay | Admitting: Vascular Surgery

## 2014-07-08 LAB — BASIC METABOLIC PANEL
Anion gap: 20 — ABNORMAL HIGH (ref 5–15)
BUN: 43 mg/dL — AB (ref 6–23)
CO2: 21 mEq/L (ref 19–32)
CREATININE: 9.32 mg/dL — AB (ref 0.50–1.35)
Calcium: 7.8 mg/dL — ABNORMAL LOW (ref 8.4–10.5)
Chloride: 95 mEq/L — ABNORMAL LOW (ref 96–112)
GFR calc non Af Amer: 5 mL/min — ABNORMAL LOW (ref >90)
GFR, EST AFRICAN AMERICAN: 6 mL/min — AB (ref >90)
Glucose, Bld: 163 mg/dL — ABNORMAL HIGH (ref 70–99)
Potassium: 4.2 mEq/L (ref 3.7–5.3)
Sodium: 136 mEq/L — ABNORMAL LOW (ref 137–147)

## 2014-07-08 LAB — GLUCOSE, CAPILLARY
GLUCOSE-CAPILLARY: 152 mg/dL — AB (ref 70–99)
GLUCOSE-CAPILLARY: 125 mg/dL — AB (ref 70–99)
Glucose-Capillary: 188 mg/dL — ABNORMAL HIGH (ref 70–99)

## 2014-07-08 LAB — CBC
HCT: 25.8 % — ABNORMAL LOW (ref 39.0–52.0)
Hemoglobin: 8.1 g/dL — ABNORMAL LOW (ref 13.0–17.0)
MCH: 26.7 pg (ref 26.0–34.0)
MCHC: 31.4 g/dL (ref 30.0–36.0)
MCV: 85.1 fL (ref 78.0–100.0)
PLATELETS: 206 10*3/uL (ref 150–400)
RBC: 3.03 MIL/uL — AB (ref 4.22–5.81)
RDW: 13.8 % (ref 11.5–15.5)
WBC: 12.1 10*3/uL — ABNORMAL HIGH (ref 4.0–10.5)

## 2014-07-08 MED ORDER — CALCIUM ACETATE 667 MG PO CAPS
667.0000 mg | ORAL_CAPSULE | Freq: Three times a day (TID) | ORAL | Status: DC
  Administered 2014-07-09 (×2): 667 mg via ORAL
  Filled 2014-07-08 (×5): qty 1

## 2014-07-08 MED ORDER — RENA-VITE PO TABS
1.0000 | ORAL_TABLET | Freq: Every day | ORAL | Status: DC
  Administered 2014-07-08: 1 via ORAL
  Filled 2014-07-08: qty 1

## 2014-07-08 NOTE — Progress Notes (Signed)
Patient [id number] Blake [name]      DOB: 1946/03/18      [id number]  Patient up in chair, no family at bedside.  Patient invited to dialysis just after I arrived.  He reports currently comfortable. Plan of care outlined in Dr. Hilma Favors notes. Will continue to support during his stay.  Gevin Perea L. Lovena Le, MD MBA The Palliative Medicine Team at [facility] Phone: [phone] Pager: [phone] ( Use team phone after hours)

## 2014-07-08 NOTE — Progress Notes (Signed)
SATURATION QUALIFICATIONS: (This note is used to comply with regulatory documentation for home oxygen)  Patient Saturations on Room Air at Rest = 88%  Patient Saturations on Room Air while Ambulating = 85%  Patient Saturations on 2 Liters of oxygen while Ambulating = 92%  Please briefly explain why patient needs home oxygen: 

## 2014-07-08 NOTE — Progress Notes (Signed)
[place] TEAM 1 Transfer Progress Note  Blake [name] [id number] DOB: Feb 04, 1946 DOA: 06/28/2014 PCP: [name], MD  Admit HPI / Brief Narrative: 68 y/o M, non-smoker, with PMH of HTN, HLD, DM, and CKD with recent work up by Dr. Oneida Alar for AVF placement and planned start of HD in the near future who presented to [facility] ER on 10/15 with complaints of worsening shortness of breath for a week.  He was seen in office by Dr. Oneida Alar on 10/15 for review of potential AVF placement. Office notes at that time reflect he was 84% on RA. Per notes, he was planned to have a graft placed on 10/28.   Admitted with Acute hypoxic resp failure, due to volume overload and AKi on CKD Started on HD this admission, also treated for CAP. S/p AVf and temporary HD access Awaiting CLIp and SNF for discharge  HPI/Subjective: No complaints, agreeable to SNF  Assessment/Plan:  Acute Hypoxemic Respiratory Failure  - pulm edema + possible CAP  -appears to be improving w/ ongoing HD  - O2 requirement decreasing - nonetheless, WBC remains elevated -Wean off O2 -Doubt pneumonia, stopped Vancomycin -Stopped Zosyn 10/23 and changed to PO Augmentin, continue for 16more days -repeat CXR due to persistent O2 needs  Acute on CKD HD initiated during this admission - temp trialysis catheter  -s/p temporary HD cath RUE AVF 10/23- creatinine in July 2012 was 1.9 - crt 2.17 Oct 2012 -now ESRD -awaiting CLIP for HD  Acute Encephalopathy - in the setting of uremia / AKI Appears to be improving steadily - B12 and folate normal   Anemia of chronic kidney disease Fe indices c/w deficiency - epo + Fe per Nephrology   Hyperkalemia resolved w/ HD    DM2 w/ renal complications  Overall trend is of improvement - A1c 6.0  HTN BP well controlled at present   HLD LDL at goal   Deconditioning -needs SNF for discharge, CSW updated  Obesity - Body mass index is 33.36 kg/(m^2).  Code Status: FULL Family  Communication: no family present at time of exam Disposition Plan: SNF in 1-2days   Consultants: Vasc Surgery  PCCM > [facility]  Nephrology    Procedures: 10/16 - temp HD catheter  Antibiotics: Vanc 10/16 > Ceftrx 10/16 >10/21 Azithro 10/16 > Zosyn 10/21 >  DVT prophylaxis: SQ heparin   Objective: Blood pressure 131/63, pulse 81, temperature 97 F (36.1 C), temperature source Oral, resp. rate 18, height 5\' 8"  (1.727 m), weight 99.499 kg (219 lb 5.7 oz), SpO2 92.00%.  Intake/Output Summary (Last 24 hours) at 07/08/14 1656 Last data filed at 07/08/14 0840  Gross per 24 hour  Intake    600 ml  Output     50 ml  Net    550 ml   Exam: General: AAOx3, no distress Lungs: CTA th/o w no wheeze  Cardiovascular: Regular rate and rhythm without murmur gallop or rub  Abdomen: Nontender, nondistended, soft, bowel sounds positive, no rebound, no ascites, no appreciable mass Extremities: No significant cyanosis, clubbing;  trace edema bilateral lower extremities  Data Reviewed: Basic Metabolic Panel:  Recent Labs Lab 07/02/14 0245 07/03/14 0212 07/05/14 0455 07/06/14 0404 07/08/14 0537  NA 139 134* 135* 134* 136*  K 4.2 4.0 4.1 4.2 4.2  CL 97 92* 95* 95* 95*  CO2 22 24 21  18* 21  GLUCOSE 91 220* 189* 237* 163*  BUN 32* 29* 45* 54* 43*  CREATININE 6.06* 5.81* 7.98* 9.64* 9.32*  CALCIUM 8.2* 7.9*  7.6* 7.4* 7.8*  MG  --  2.1  --   --   --     Liver Function Tests:  Recent Labs Lab 07/02/14 0245 07/03/14 0212  AST 20 44*  ALT 16 36  ALKPHOS 114 130*  BILITOT 0.3 0.2*  PROT 7.9 8.0  ALBUMIN 2.0* 2.2*    Coags:  Recent Labs Lab 07/02/14 0245  INR 1.33    Recent Labs Lab 07/02/14 0245  APTT 34    CBC:  Recent Labs Lab 07/02/14 0245 07/03/14 0212 07/06/14 0404 07/08/14 0537  WBC 12.2* 12.3* 13.6* 12.1*  NEUTROABS  --  8.7*  --   --   HGB 9.6* 10.1* 8.6* 8.1*  HCT 31.2* 31.4* 26.8* 25.8*  MCV 86.0 86.3 83.2 85.1  PLT 282 253 219 206    Cardiac  Enzymes: No results found for this basename: CKTOTAL, CKMB, CKMBINDEX, TROPONINI,  in the last 168 hours BNP (last 3 results)  Recent Labs  06/28/14 0755  PROBNP 1556.0*    CBG:  Recent Labs Lab 07/07/14 1142 07/07/14 1700 07/07/14 2007 07/08/14 0727 07/08/14 1227  GLUCAP 240* 332* 209* 152* 188*    Recent Results (from the past 240 hour(s))  CLOSTRIDIUM DIFFICILE BY PCR     Status: None   Collection Time    07/04/14  3:06 PM      Result Value Ref Range Status   C difficile by pcr NEGATIVE  NEGATIVE Final     Studies:  Recent x-ray studies have been reviewed in detail by the Attending Physician  Scheduled Meds:  Scheduled Meds: . amoxicillin-clavulanate  1 tablet Oral Q24H  . aspirin EC  81 mg Oral Daily  . atorvastatin  20 mg Oral q1800  . buPROPion  150 mg Oral Daily  . calcitRIOL  0.25 mcg Oral Daily  . calcium acetate  667 mg Oral TID WC  . darbepoetin (ARANESP) injection - DIALYSIS  60 mcg Intravenous Q Sat-HD  . DULoxetine  20 mg Oral Daily  . feeding supplement (NEPRO CARB STEADY)  237 mL Oral BID BM  . heparin  5,000 Units Subcutaneous 3 times per day  . insulin aspart  0-15 Units Subcutaneous TID WC  . insulin aspart  0-5 Units Subcutaneous QHS  . insulin aspart  4 Units Subcutaneous TID WC  . insulin glargine  30 Units Subcutaneous QHS  . multivitamin  1 tablet Oral QHS  . pantoprazole  40 mg Oral Daily    Time spent on care of this patient: 65 mins   Domenic Polite , MD   [facility] Office  [phone] Pager - Text Page per Shea Evans as per below:  On-Call/Text Page:      [email or web address]      password [email or web address]  If 7PM-7AM, please contact night-coverage [email or web address] Password [email or web address] 07/08/2014, 4:56 PM   LOS: 10 days

## 2014-07-08 NOTE — Progress Notes (Signed)
07/08/2014 10:47 AM  Patient has been set up at [facility] TTS second shift. Per primary MD, patient is not discharging today.  Pt was to start as an outpatient yesterday--HD notified to inform center.  Princella Pellegrini

## 2014-07-08 NOTE — Clinical Social Work Note (Signed)
CSW received SNF consult for patient. Attempted to talk with patient twice, at 11:55 am and 1:18 pm, however he was soundly asleep and did not arouse when his name was called several times. CSW visited room at 3:22 pm and patient was being taken to dialysis. CSW will f/u with patient on Tuesday to complete assessment.   Semaje Kinker Givens, MSW, LCSW [phone]

## 2014-07-08 NOTE — Care Management Note (Signed)
CARE MANAGEMENT NOTE 07/08/2014  Patient:  Blake [name], Blake [name]   Account Number:  [id number]  Date Initiated:  06/28/2014  Documentation initiated by:  Elissa Hefty  Subjective/Objective Assessment:   adm w resp distress     Action/Plan:   lives w niece  07/08/2014 CM following for progression and d/c planning. Plan to d/c to SNF.   Anticipated DC Date:  07/10/2014   Anticipated DC Plan:  SKILLED NURSING FACILITY         Choice offered to / List presented to:             Status of service:   Medicare Important Message given?  YES (If response is "NO", the following Medicare IM given date fields will be blank) Date Medicare IM given:  07/01/2014 Medicare IM given by:  Elissa Hefty Date Additional Medicare IM given:  07/08/2014 Additional Medicare IM given by:  [facility]  Discharge Disposition:    Per UR Regulation:  Reviewed for med. necessity/level of care/duration of stay  If discussed at [place] of Stay Meetings, dates discussed:   07/04/2014    Comments:

## 2014-07-08 NOTE — Progress Notes (Signed)
Patient ID: Blake [name], male    DOB: [birth year], 68 y.o.   MRN: [id number] Nephrology Progress Note  S: No complaints this morning. Trying to get out of bed with PT and felt nauseas/vomited x 1.  O:BP 130/61  Pulse 76  Temp(Src) 98.3 F (36.8 C) (Oral)  Resp 20  Ht 5\' 8"  (1.727 m)  Wt 219 lb 5.7 oz (99.499 kg)  BMI 33.36 kg/m2  SpO2 97%  Intake/Output Summary (Last 24 hours) at 07/08/14 0818 Last data filed at 07/08/14 0700  Gross per 24 hour  Intake    480 ml  Output     50 ml  Net    430 ml   Intake/Output: I/O last 3 completed shifts: In: 1080 [P.O.:1080] Out: 50 [Urine:50]  Intake/Output this shift:    Weight change: -3 lb 12 oz (-1.701 kg)  Gen: NAD CVS: RRR, normal heart sounds, no murmurs. Right AVF +thrill, +bruit Resp: clear anteriorly, Nasal canula in place.diminished bs Abd: soft, +BS Obese Ext: no edema Neuro: alert/oriented, grossly normal   Recent Labs Lab 07/02/14 0245 07/03/14 0212 07/05/14 0455 07/06/14 0404 07/08/14 0537  NA 139 134* 135* 134* 136*  K 4.2 4.0 4.1 4.2 4.2  CL 97 92* 95* 95* 95*  CO2 22 24 21  18* 21  GLUCOSE 91 220* 189* 237* 163*  BUN 32* 29* 45* 54* 43*  CREATININE 6.06* 5.81* 7.98* 9.64* 9.32*  ALBUMIN 2.0* 2.2*  --   --   --   CALCIUM 8.2* 7.9* 7.6* 7.4* 7.8*  AST 20 44*  --   --   --   ALT 16 36  --   --   --    Liver Function Tests:  Recent Labs Lab 07/02/14 0245 07/03/14 0212  AST 20 44*  ALT 16 36  ALKPHOS 114 130*  BILITOT 0.3 0.2*  PROT 7.9 8.0  ALBUMIN 2.0* 2.2*   No results found for this basename: LIPASE, AMYLASE,  in the last 168 hours No results found for this basename: AMMONIA,  in the last 168 hours CBC:  Recent Labs Lab 07/02/14 0245 07/03/14 0212 07/06/14 0404 07/08/14 0537  WBC 12.2* 12.3* 13.6* 12.1*  NEUTROABS  --  8.7*  --   --   HGB 9.6* 10.1* 8.6* 8.1*  HCT 31.2* 31.4* 26.8* 25.8*  MCV 86.0 86.3 83.2 85.1  PLT 282 253 219 206   Cardiac Enzymes: No results found for  this basename: CKTOTAL, CKMB, CKMBINDEX, TROPONINI,  in the last 168 hours CBG:  Recent Labs Lab 07/07/14 0757 07/07/14 1142 07/07/14 1700 07/07/14 2007 07/08/14 0727  GLUCAP 258* 240* 332* 209* 152*    Iron Studies:  No results found for this basename: IRON, TIBC, TRANSFERRIN, FERRITIN,  in the last 72 hours Studies/Results: No results found. Marland Kitchen amoxicillin-clavulanate  1 tablet Oral Q24H  . aspirin EC  81 mg Oral Daily  . atorvastatin  20 mg Oral q1800  . buPROPion  150 mg Oral Daily  . calcitRIOL  0.25 mcg Oral Daily  . darbepoetin (ARANESP) injection - DIALYSIS  60 mcg Intravenous Q Sat-HD  . DULoxetine  20 mg Oral Daily  . feeding supplement (NEPRO CARB STEADY)  237 mL Oral BID BM  . heparin  5,000 Units Subcutaneous 3 times per day  . insulin aspart  0-15 Units Subcutaneous TID WC  . insulin aspart  0-5 Units Subcutaneous QHS  . insulin aspart  4 Units Subcutaneous TID WC  . insulin glargine  30  Units Subcutaneous QHS  . pantoprazole  40 mg Oral Daily    BMET    Component Value Date/Time   NA 136* 07/08/2014 0537   K 4.2 07/08/2014 0537   CL 95* 07/08/2014 0537   CO2 21 07/08/2014 0537   GLUCOSE 163* 07/08/2014 0537   BUN 43* 07/08/2014 0537   CREATININE 9.32* 07/08/2014 0537   CALCIUM 7.8* 07/08/2014 0537   GFRNONAA 5* 07/08/2014 0537   GFRAA 6* 07/08/2014 0537   CBC    Component Value Date/Time   WBC 12.1* 07/08/2014 0537   RBC 3.03* 07/08/2014 0537   RBC 3.63* 07/02/2014 0245   HGB 8.1* 07/08/2014 0537   HCT 25.8* 07/08/2014 0537   PLT 206 07/08/2014 0537   MCV 85.1 07/08/2014 0537   MCH 26.7 07/08/2014 0537   MCHC 31.4 07/08/2014 0537   RDW 13.8 07/08/2014 0537   LYMPHSABS 1.3 07/03/2014 0212   MONOABS 1.7* 07/03/2014 0212   EOSABS 0.6 07/03/2014 0212   BASOSABS 0.0 07/03/2014 0212    Assessment/Plan: 68 y.o. male with progressive CKD, DM, HTN; admitted for worsening SOB with Scr 10.2 and K of 5.6. HD started in hospital.  1. 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 AT ESRD., to get to outpatient when stable 2. Vascular access: left IJ TDC. RUE brachiocephalic AVF 3. SHPTH: Phos 7.4, Ca 7.9, iPTH 535. On calcitriol and starting phoslo. Control phos 4. ACD: weekly aranesp 5. AG metabolic acidosis: resolved with HD  6. Hyperkalemia: resolved with HD  7. DM: per primary  8. HTN: holding amlodipine currently. 9. CAP: on augmentin 10. Dispo: outpt HD setup [facility] for TTS; pending SNF placement 11. obesity   Tawanna Sat I have seen and examined this patient and agree with the plan of care seen , eval, discussed, examined, education .  Kaire Stary L 07/08/2014, 2:23 PM

## 2014-07-08 NOTE — Progress Notes (Signed)
Physical Therapy Treatment Patient Details Name: AMEAR FRANTA MRN: [id number] DOB: 04-Nov-1945 Today's Date: 07/08/2014    History of Present Illness Pt is a 68 y/o M, non-smoker, with PMH of HTN, HLD, DM, CKD with recent work up for AVF placement and planned start of HD in the near future who presented to [facility] ER on 10/15 with complaints of worsening shortness of breath x 1 month. Specifically it has worsened over the past week prior to presentation. He was seen in office by Dr. Oneida Alar on 10/15 for review of potential AVF placement. Office notes at that time reflect he was 84% on RA. Per notes, he was planned to have a graft placed on 10/28. Niece (who lives with the patient) reports he has had a couple of falls over the last few months. At baseline, he continues to drive and is able to perform ADL's. On presentation to the ER, he was noted to have significant shortness of breath and saturations of 47%.He has started dialysis this admission    PT Comments    Pt progressing slowly towards physical therapy goals. Continues to be limited by N/V. Pt had anti-nausea medication after vomiting, and was very lethargic for rest of session. Pt was able to take a few steps with RW but continues to demonstrate an ataxic gait pattern, requiring +2 for safety and occasional physical assist. Will continue to follow.  Follow Up Recommendations  SNF;Supervision/Assistance - 24 hour     Equipment Recommendations  Rolling walker with 5" wheels;3in1 (PT)    Recommendations for Other Services       Precautions / Restrictions Precautions Precautions: Fall Precaution Comments: N/V first session with PT and now with OT/PT session Restrictions Weight Bearing Restrictions: No    Mobility  Bed Mobility Overal bed mobility: Needs Assistance Bed Mobility: Supine to Sit     Supine to sit: Min guard;HOB elevated     General bed mobility comments: Pt with heavy use of bed rails and asking for [facility] to be  raised for assist. Encouraged pt to try from lower level. VC's were required for scooting all the way out to edge of bed so feet were resting on floor.   Transfers Overall transfer level: Needs assistance Equipment used: Rolling walker (2 wheeled) Transfers: Sit to/from Stand Sit to Stand: +2 physical assistance;Min assist         General transfer comment: Pt required steadying assist once standing.   Ambulation/Gait Ambulation/Gait assistance: Mod assist;+2 physical assistance;+2 safety/equipment Ambulation Distance (Feet): 5 Feet Assistive device: Rolling walker (2 wheeled) Gait Pattern/deviations: Step-to pattern;Decreased stride length;Ataxic;Trunk flexed Gait velocity: Decreased Gait velocity interpretation: Below normal speed for age/gender General Gait Details: +2 assist as pt attempted steps. Appeared ataxic and fatigued quickly, requiring chair to be pulled up behind him for seated rest break.    Stairs            Wheelchair Mobility    Modified Rankin (Stroke Patients Only)       Balance Overall balance assessment: Needs assistance Sitting-balance support: Feet supported;No upper extremity supported Sitting balance-Leahy Scale: Fair     Standing balance support: Bilateral upper extremity supported Standing balance-Leahy Scale: Poor                      Cognition Arousal/Alertness: Awake/alert Behavior During Therapy: Flat affect Overall Cognitive Status: Within Functional Limits for tasks assessed  Exercises      General Comments        Pertinent Vitals/Pain Pain Assessment: No/denies pain    Home Living Family/patient expects to be discharged to:: Skilled nursing facility Living Arrangements: Other relatives (sister and niece) Available Help at Discharge: Family;Available PRN/intermittently Type of Home: House Home Access: Level entry   Home Layout: Two level;Able to live on main level with  bedroom/bathroom Home Equipment: Cane - quad      Prior Function Level of Independence: Independent with assistive device(s)      Comments: Pt states he was able to do all ADL's and did not need assist to ambulate as long as he had his cane   PT Goals (current goals can now be found in the care plan section) Acute Rehab PT Goals Patient Stated Goal: Feel better (regarding breathing and nausea) PT Goal Formulation: With patient Time For Goal Achievement: 07/19/14 Potential to Achieve Goals: Good Progress towards PT goals: Progressing toward goals    Frequency  Min 2X/week    PT Plan Current plan remains appropriate    Co-evaluation PT/OT/SLP Co-Evaluation/Treatment: Yes Reason for Co-Treatment: For patient/therapist safety PT goals addressed during session: Mobility/safety with mobility;Proper use of DME;Balance OT goals addressed during session: ADL's and self-care     End of Session Equipment Utilized During Treatment: Gait belt;Oxygen Activity Tolerance: Patient limited by fatigue Patient left: with call bell/phone within reach;in chair     Time: [id number] PT Time Calculation (min): 35 min  Charges:  $Therapeutic Activity: 8-22 mins                    G Codes:      Rolinda Roan July 22, 2014, 12:31 PM  Rolinda Roan, PT, DPT Acute Rehabilitation Services Pager: [phone]

## 2014-07-08 NOTE — Telephone Encounter (Addendum)
Message copied by Gena Fray on Mon Jul 08, 2014  3:28 PM ------      Message from: Mena Goes      Created: Fri Jul 05, 2014  3:49 PM      Regarding: Schedule                   ----- Message -----         From: Conrad Morrow, MD         Sent: 07/05/2014   3:25 PM           To: [facility] [address]      [id number]      Feb 15, 1946            PROCEDURE:      1. Left internal jugular vein tunneled dialysis catheter placement      2. Left internal jugular vein cannulation under ultrasound guidance      3. Right brachiocephalic arteriovenous fistula placement      4. Right temporary dialysis catheter removal            Asst: Gerri Lins, PAC             Follow-up: 6 weeks       ------  07/08/14: left msg for patient, dpm

## 2014-07-08 NOTE — Evaluation (Addendum)
Occupational Therapy Evaluation Patient Details Name: Blake [name] MRN: [id number] DOB: April 21, 1946 Today's Date: 07/08/2014    History of Present Illness Pt is a 68 y/o M, non-smoker, with PMH of HTN, HLD, DM, CKD with recent work up for AVF placement and planned start of HD in the near future who presented to [facility] ER on 10/15 with complaints of worsening shortness of breath x 1 month. Specifically it has worsened over the past week prior to presentation. He was seen in office by Dr. Oneida Alar on 10/15 for review of potential AVF placement. Office notes at that time reflect he was 84% on RA. Per notes, he was planned to have a graft placed on 10/28. Niece (who lives with the patient) reports he has had a couple of falls over the last few months. At baseline, he continues to drive and is able to perform ADL's. On presentation to the ER, he was noted to have significant shortness of breath and saturations of 47%.He has started dialysis this admission   Clinical Impression   This 68yo male admitted with above presents to acute OT with generalized weakness, decreased balance, decreased mobility, N/V (x2 sessions now), ataxic movements with ambulation all affecting his ability to care for himself at a Mod I level as he was pta. He will benefit from acute OT with follow up at SNF to get back to a Mod I level.    Follow Up Recommendations  SNF    Equipment Recommendations   (TBD at next venue)       Precautions / Restrictions Precautions Precautions: Fall Precaution Comments: N/V first session with PT and now with OT/PT session Restrictions Weight Bearing Restrictions: No      Mobility Bed Mobility Overal bed mobility: Needs Assistance Bed Mobility: Supine to Sit     Supine to sit: Min guard;HOB elevated (and use of rail)        Transfers Overall transfer level: Needs assistance Equipment used: Rolling walker (2 wheeled) Transfers: Sit to/from Stand Sit to Stand: +2 physical  assistance;Min assist         General transfer comment: Mod A +2 for ambulation (pt ataxic)    Balance Overall balance assessment: Needs assistance;History of Falls Sitting-balance support: Feet supported;Bilateral upper extremity supported Sitting balance-Leahy Scale: Fair     Standing balance support: Bilateral upper extremity supported Standing balance-Leahy Scale: Poor                              ADL Overall ADL's : Needs assistance/impaired Eating/Feeding: Independent;Sitting   Grooming: Set up;Sitting   Upper Body Bathing: Set up;Sitting   Lower Body Bathing: Maximal assistance (with min A sit<>stand)   Upper Body Dressing : Set up;Sitting   Lower Body Dressing: Maximal assistance (wiht min A sit<>stand) Lower Body Dressing Details (indicate cue type and reason): Of note: pt does not wear socks at home and he has velcro shoes Toilet Transfer: Moderate assistance;Stand-pivot;RW (bed>recliner)   Toileting- Clothing Manipulation and Hygiene: Moderate assistance (with min A sit <>stand)                         Pertinent Vitals/Pain Pain Assessment: No/denies pain     Hand Dominance Right   Extremity/Trunk Assessment Upper Extremity Assessment Upper Extremity Assessment: Overall WFL for tasks assessed           Communication Communication Communication: No difficulties   Cognition Arousal/Alertness: Awake/alert  Behavior During Therapy: Flat affect Overall Cognitive Status: Within Functional Limits for tasks assessed                                Home Living Family/patient expects to be discharged to:: Skilled nursing facility Living Arrangements: Other relatives (sister and niece) Available Help at Discharge: Family;Available PRN/intermittently Type of Home: House Home Access: Level entry     Home Layout: Two level;Able to live on main level with bedroom/bathroom     Bathroom Shower/Tub: Tub/shower  unit;Curtain Shower/tub characteristics: Architectural technologist: Standard     Home Equipment: Cane - quad          Prior Functioning/Environment Level of Independence: Independent with assistive device(s)        Comments: Pt states he was able to do all ADL's and did not need assist to ambulate as long as he had his cane    OT Diagnosis: Generalized weakness   OT Problem List: Decreased strength;Decreased activity tolerance;Impaired balance (sitting and/or standing);Decreased knowledge of use of DME or AE   OT Treatment/Interventions: Self-care/ADL training;Balance training;Patient/family education;DME and/or AE instruction    OT Goals(Current goals can be found in the care plan section) Acute Rehab OT Goals OT Goal Formulation: With patient Time For Goal Achievement: 07/22/14 Potential to Achieve Goals: Good ADL Goals Pt Will Perform Grooming: with min guard assist;standing (2 tasks) Pt Will Perform Lower Body Bathing: with min assist;sit to/from stand Pt Will Perform Lower Body Dressing: with min assist;sit to/from stand Pt Will Transfer to Toilet: with min assist;ambulating;bedside commode (over toliet) Pt Will Perform Toileting - Clothing Manipulation and hygiene: with min guard assist;sit to/from stand  OT Frequency: Min 2X/week           Co-evaluation PT/OT/SLP Co-Evaluation/Treatment: Yes Reason for Co-Treatment: Complexity of the patient's impairments (multi-system involvement)   OT goals addressed during session: ADL's and self-care      End of Session Equipment Utilized During Treatment: Gait belt;Rolling walker  Activity Tolerance:  (limited by N/V and swaying (post anti-nausea IV meds given)) Patient left: in chair;with call bell/phone within reach   Time: [id number] OT Time Calculation (min): 35 min Charges:  OT General Charges $OT Visit: 1 Procedure OT Evaluation $Initial OT Evaluation Tier I: 1 Procedure OT Treatments $Self Care/Home  Management : 8-22 mins  Almon Register  [id number]  07/08/2014, 11:39 AM

## 2014-07-09 ENCOUNTER — Encounter (HOSPITAL_COMMUNITY): Payer: Self-pay | Admitting: Vascular Surgery

## 2014-07-09 LAB — GLUCOSE, CAPILLARY
Glucose-Capillary: 188 mg/dL — ABNORMAL HIGH (ref 70–99)
Glucose-Capillary: 216 mg/dL — ABNORMAL HIGH (ref 70–99)

## 2014-07-09 MED ORDER — CALCIUM ACETATE 667 MG PO CAPS: 667.0000 mg | ORAL_CAPSULE | Freq: Three times a day (TID) | ORAL | Status: DC

## 2014-07-09 MED ORDER — INSULIN ASPART 100 UNIT/ML ~~LOC~~ SOLN: 4.0000 [IU] | Freq: Three times a day (TID) | SUBCUTANEOUS | Status: DC

## 2014-07-09 MED ORDER — INSULIN ASPART 100 UNIT/ML ~~LOC~~ SOLN: 0.0000 [IU] | Freq: Three times a day (TID) | SUBCUTANEOUS | Status: DC

## 2014-07-09 MED ORDER — INSULIN GLARGINE 100 UNIT/ML ~~LOC~~ SOLN: 25.0000 [IU] | Freq: Every day | SUBCUTANEOUS | Status: DC

## 2014-07-09 MED ORDER — NEPRO/CARBSTEADY PO LIQD: 237.0000 mL | Freq: Two times a day (BID) | ORAL | Status: DC

## 2014-07-09 MED ORDER — PANTOPRAZOLE SODIUM 40 MG PO TBEC: 40.0000 mg | DELAYED_RELEASE_TABLET | Freq: Every day | ORAL | Status: DC

## 2014-07-09 MED ORDER — DULOXETINE HCL 20 MG PO CPEP: 20.0000 mg | ORAL_CAPSULE | Freq: Every day | ORAL | Status: DC

## 2014-07-09 NOTE — Clinical Social Work Placement (Signed)
Clinical Social Work Department CLINICAL SOCIAL WORK PLACEMENT NOTE 07/09/2014  Patient:  Blake [name], Blake [name]  Account Number:  [id number] Admit date:  06/28/2014  Clinical Social Worker:  Gwenevere Abbot,  Date/time:  07/09/2014 02:21 PM  Clinical Social Work is seeking post-discharge placement for this patient at the following level of care:   SKILLED NURSING   (*CSW will update this form in Epic as items are completed)   07/09/2014  Patient/family provided with [facility] Department of Clinical Social Work's list of facilities offering this level of care within the geographic area requested by the patient (or if unable, by the patient's family).  07/09/2014  Patient/family informed of their freedom to choose among providers that offer the needed level of care, that participate in Medicare, Medicaid or managed care program needed by the patient, have an available bed and are willing to accept the patient.  07/09/2014  Patient/family informed of [facility]' ownership interest in [facility], as well as of the fact that they are under no obligation to receive care at this facility.  PASARR submitted to EDS on 07/08/2014 PASARR number received on 07/08/2014  FL2 transmitted to all facilities in geographic area requested by pt/family on  07/09/2014 FL2 transmitted to all facilities within larger geographic area on   Patient informed that his/her managed care company has contracts with or will negotiate with  certain facilities, including the following:        Patient/family informed of bed offers received:  07/09/2014 Patient chooses bed at [facility] Physician recommends and patient chooses bed at    Patient to be transferred to [place] on  07/09/2014 Patient to be transferred to facility by [facility] Patient and family notified of transfer on 07/09/2014 Name of family member notified:  Cabell Baray (niece)  [phone]  The following physician request were entered in Epic:   Additional Comments:

## 2014-07-09 NOTE — Clinical Social Work Psychosocial (Signed)
Clinical Social Work Department BRIEF PSYCHOSOCIAL ASSESSMENT 07/09/2014  Patient:  Blake [name], Blake [name]     Account Number:  [id number]     Admit date:  06/28/2014  Clinical Social Worker:  Gwenevere Abbot,  Date/Time:  07/09/2014 11:35 AM  Referred by:    Date Referred:    Other Referral:   Interview type:   Other interview type:    PSYCHOSOCIAL DATA Living Status:  FAMILY Admitted from facility:   Level of care:   Primary support name:  Blake [name] Primary support relationship to patient:  SIBLING Degree of support available:   Patient lives with his sister Blake [name] [phone]) and Blake [name] [phone])    CURRENT CONCERNS  Other Concerns:    SOCIAL WORK ASSESSMENT / PLAN CSW and CSW intern spoke with patient about SNF placement. Patient stated that he would like to contact his sister concerns regarding  placement offer. Patient was concerned about placement and recieving  "good patient care" from the facility.Patient expressed to CSW that he would want the best facility for himself.  Mr.Cabeza expressed his concern of transportation to and from dialysis center after discharge. CSW informed patient that we would follow up with transportation services such as [facility] and [facility].    Plan is for patient to discharge to SNF today.   Assessment/plan status:  Information/Referral to [employer] Other assessment/ plan:   Information/referral to community resources:    PATIENT'S/FAMILY'S RESPONSE TO PLAN OF CARE: Patient was agreeable to SNF placement.

## 2014-07-09 NOTE — Progress Notes (Signed)
Patient ID: Blake [name], male    DOB: [birth year], 68 y.o.   MRN: [id number] Nephrology Progress Note  S: no complaints, breathing is good. Walked about 10 feet yesterday with PT, better.  O:BP 127/55  Pulse 86  Temp(Src) 97.8 F (36.6 C) (Oral)  Resp 18  Ht 5\' 8"  (1.727 m)  Wt 216 lb 7.9 oz (98.2 kg)  BMI 32.93 kg/m2  SpO2 100%  Intake/Output Summary (Last 24 hours) at 07/09/14 0815 Last data filed at 07/09/14 0440  Gross per 24 hour  Intake    360 ml  Output   3100 ml  Net  -2740 ml   Intake/Output: I/O last 3 completed shifts: In: 60 [P.O.:720] Out: 3150 [Urine:150; Other:3000]  Intake/Output this shift:    Weight change: -2 lb 13.8 oz (-1.299 kg)  Gen: NAD slow mentation CVS: RRR, normal heart sounds, no murmurs. Right AVF +thrill, +bruit  Gr 2/6 M Resp: clear anteriorly, Nasal canula in place.diminished bs Abd: soft, +BS Obese Liver down 5 cm Ext: no edema Neuro: alert/oriented, grossly normal   Recent Labs Lab 07/03/14 0212 07/05/14 0455 07/06/14 0404 07/08/14 0537  NA 134* 135* 134* 136*  K 4.0 4.1 4.2 4.2  CL 92* 95* 95* 95*  CO2 24 21 18* 21  GLUCOSE 220* 189* 237* 163*  BUN 29* 45* 54* 43*  CREATININE 5.81* 7.98* 9.64* 9.32*  ALBUMIN 2.2*  --   --   --   CALCIUM 7.9* 7.6* 7.4* 7.8*  AST 44*  --   --   --   ALT 36  --   --   --    Liver Function Tests:  Recent Labs Lab 07/03/14 0212  AST 44*  ALT 36  ALKPHOS 130*  BILITOT 0.2*  PROT 8.0  ALBUMIN 2.2*   No results found for this basename: LIPASE, AMYLASE,  in the last 168 hours No results found for this basename: AMMONIA,  in the last 168 hours CBC:  Recent Labs Lab 07/03/14 0212 07/06/14 0404 07/08/14 0537  WBC 12.3* 13.6* 12.1*  NEUTROABS 8.7*  --   --   HGB 10.1* 8.6* 8.1*  HCT 31.4* 26.8* 25.8*  MCV 86.3 83.2 85.1  PLT 253 219 206   Cardiac Enzymes: No results found for this basename: CKTOTAL, CKMB, CKMBINDEX, TROPONINI,  in the last 168 hours CBG:  Recent  Labs Lab 07/07/14 2007 07/08/14 0727 07/08/14 1227 07/08/14 2012 07/09/14 0752  GLUCAP 209* 152* 188* 125* 188*    Iron Studies:  No results found for this basename: IRON, TIBC, TRANSFERRIN, FERRITIN,  in the last 72 hours Studies/Results: Dg Chest 2 View  07/09/2014   CLINICAL DATA:  Hypoxia  EXAM: CHEST  2 VIEW  COMPARISON:  07/05/2014  FINDINGS: Left-sided large-bore catheter tip projecting over the cavoatrial junction. Prominent cardiomediastinal contours, similar to prior. Interstitial and hazy airspace opacities, right greater than left, similar to prior. No definite pleural effusion. No pneumothorax. No acute osseous finding.  IMPRESSION: Right greater the left interstitial and hazy airspace opacities; asymmetric pulmonary edema and/or multifocal infection.   Electronically Signed   By: Carlos Levering M.D.   On: 07/09/2014 02:47   . aspirin EC  81 mg Oral Daily  . atorvastatin  20 mg Oral q1800  . buPROPion  150 mg Oral Daily  . calcitRIOL  0.25 mcg Oral Daily  . calcium acetate  667 mg Oral TID WC  . darbepoetin (ARANESP) injection - DIALYSIS  60 mcg Intravenous Q Sat-HD  .  DULoxetine  20 mg Oral Daily  . feeding supplement (NEPRO CARB STEADY)  237 mL Oral BID BM  . heparin  5,000 Units Subcutaneous 3 times per day  . insulin aspart  0-15 Units Subcutaneous TID WC  . insulin aspart  0-5 Units Subcutaneous QHS  . insulin aspart  4 Units Subcutaneous TID WC  . insulin glargine  30 Units Subcutaneous QHS  . multivitamin  1 tablet Oral QHS  . pantoprazole  40 mg Oral Daily    BMET    Component Value Date/Time   NA 136* 07/08/2014 0537   K 4.2 07/08/2014 0537   CL 95* 07/08/2014 0537   CO2 21 07/08/2014 0537   GLUCOSE 163* 07/08/2014 0537   BUN 43* 07/08/2014 0537   CREATININE 9.32* 07/08/2014 0537   CALCIUM 7.8* 07/08/2014 0537   GFRNONAA 5* 07/08/2014 0537   GFRAA 6* 07/08/2014 0537   CBC    Component Value Date/Time   WBC 12.1* 07/08/2014 0537   RBC 3.03*  07/08/2014 0537   RBC 3.63* 07/02/2014 0245   HGB 8.1* 07/08/2014 0537   HCT 25.8* 07/08/2014 0537   PLT 206 07/08/2014 0537   MCV 85.1 07/08/2014 0537   MCH 26.7 07/08/2014 0537   MCHC 31.4 07/08/2014 0537   RDW 13.8 07/08/2014 0537   LYMPHSABS 1.3 07/03/2014 0212   MONOABS 1.7* 07/03/2014 0212   EOSABS 0.6 07/03/2014 0212   BASOSABS 0.0 07/03/2014 0212    Assessment/Plan: 68 y.o. male with progressive CKD, DM, HTN; admitted for worsening SOB with Scr 10.2 and K of 5.6. HD started in hospital.  1. Acute on CKD Now at ESRD: TTS. Rcv'd yesterday, plan for Thursday if still here 2. Vascular access: left IJ TDC. RUE brachiocephalic AVF 3. SHPTH: Phos 7.4, Ca 7.9, iPTH 535. On calcitriol and phoslo.  4. ACRF: weekly aranesp, Fe ok 5. AG metabolic acidosis: resolved with HD  6. Hyperkalemia: resolved with HD  7. DM: per primary  8. HTN: holding amlodipine currently. Better with vol control 9. CAP: on augmentin 10. Dispo: outpt HD setup [facility] for TTS; pending SNF placement 11. obesity  Tawanna Sat  I have seen and examined this patient and agree with the plan of care seen, eval, examined, and discussed.  Will communicate with [facility] if goes home .  Duston Smolenski L 07/09/2014, 1:50 PM

## 2014-07-09 NOTE — Clinical Social Work Psychosocial (Signed)
CSW has reviewed and approves patient assessment.  Annamae Shivley Givens, MSW, LCSW [phone]

## 2014-07-09 NOTE — Discharge Instructions (Signed)
Follow with Primary MD [name], MD in 7 days   Get CBC, CMP, 2 view Chest X ray checked  by Primary MD next visit.    Activity: As per PT/OT recommendation   Disposition SNF   Diet: Renal hemodialysis diet 1200 fluid restriction, with feeding assistance and aspiration precautions as needed.  For Heart failure patients - Check your Weight same time everyday, if you gain over 2 pounds, or you develop in leg swelling, experience more shortness of breath or chest pain, call your Primary MD immediately.   On your next visit with your primary care physician please Get Medicines reviewed and adjusted.   Please request your Prim.MD to go over all Hospital Tests and Procedure/Radiological results at the follow up, please get all Hospital records sent to your Prim MD by signing hospital release before you go home.   If you experience worsening of your admission symptoms, develop shortness of breath, life threatening emergency, suicidal or homicidal thoughts you must seek medical attention immediately by calling 911 or calling your MD immediately  if symptoms less severe.  You Must read complete instructions/literature along with all the possible adverse reactions/side effects for all the Medicines you take and that have been prescribed to you. Take any new Medicines after you have completely understood and accpet all the possible adverse reactions/side effects.   Do not drive, operating heavy machinery, perform activities at heights, swimming or participation in water activities or provide baby sitting services if your were admitted for syncope or siezures until you have seen by Primary MD or a Neurologist and advised to do so again.  Do not drive when taking Pain medications.    Do not take more than prescribed Pain, Sleep and Anxiety Medications  Special Instructions: If you have smoked or chewed Tobacco  in the last 2 yrs please stop smoking, stop any regular Alcohol  and or any  Recreational drug use.  Wear Seat belts while driving.   Please note  You were cared for by a hospitalist during your hospital stay. If you have any questions about your discharge medications or the care you received while you were in the hospital after you are discharged, you can call the unit and asked to speak with the hospitalist on call if the hospitalist that took care of you is not available. Once you are discharged, your primary care physician will handle any further medical issues. Please note that NO REFILLS for any discharge medications will be authorized once you are discharged, as it is imperative that you return to your primary care physician (or establish a relationship with a primary care physician if you do not have one) for your aftercare needs so that they can reassess your need for medications and monitor your lab values.

## 2014-07-09 NOTE — Progress Notes (Signed)
Patient Discharge:  Disposition: Discharged to SNF  Education: FL2 sent to SNF  IV: Removed  Telemetry: Removed   Transportation: Transported via ambulance  Belongings:Cane and all other pt belongings taken with pt.

## 2014-07-09 NOTE — Clinical Social Work Placement (Signed)
Placement note reviewed and approved.  Blake [name], MSW, LCSW [phone]

## 2014-07-09 NOTE — Discharge Summary (Signed)
Blake [name], 68 y.o., DOB 01-Jul-1946, MRN [id number]. 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 Diabetes mellitus without complication   . Hypertension   . Hyperlipidemia   . Renal disorder     Past Surgical History  Procedure Laterality Date  . 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 Oneida Alar for AVF placement and planned start of HD in the near future who presented to [facility] ER on 10/15 with complaints of worsening shortness of breath for a week. He was seen in office by Dr. Oneida Alar on 10/15 for review of potential AVF placement. Office notes at that time reflect he was 84% on RA. Per notes, he was planned to have a graft placed on 10/28.  Admitted with Acute hypoxic resp  failure, due to volume overload and AKi on CKD  Started on HD this admission, also treated for CAP during his hospitalization. S/p AVf and temporary HD access   Acute Hypoxemic Respiratory Failure - pulm edema + possible CAP  -Continues to improve with ongoing hemodialysis - O2 requirement decreasing  -Wean off O2 , currently on room air -Treated for healthcare acquired pneumonia, initially on vancomycin and Zosyn, which was later transitioned to oral Augmentin, she finished as an inpatient.  Acute on CKD  HD initiated during this admission - temp trialysis catheter  -s/p temporary HD cath RUE AVF -now ESRD  -CLIPed for HD   Acute Encephalopathy  - in the setting of uremia / AKI  -Resolved  Anemia of chronic kidney disease  Fe indices c/w deficiency - epo + Fe per Nephrology   Hyperkalemia  resolved w/ HD   DM2 w/ renal complications  Overall trend is of improvement - A1c 6.0   HTN  BP well controlled at present   HLD  LDL at goal      Code Status: FULL   Consultants:  Vasc Surgery  PCCM > [facility]  Nephrology  Procedures:  10/16 - temp HD catheter  Antibiotics:  Vanc 10/16 >  Ceftrx 10/16 >10/21  Azithro 10/16 >  Zosyn 10/21 >    Significant Tests:  See full reports for all details    Dg Chest 2 View  07/09/2014   CLINICAL DATA:  Hypoxia  EXAM: CHEST  2 VIEW  COMPARISON:  07/05/2014  FINDINGS: Left-sided large-bore catheter tip projecting over the cavoatrial junction. Prominent cardiomediastinal contours, similar to prior. Interstitial and hazy airspace opacities, right greater than left, similar to prior. No definite pleural effusion. No pneumothorax. No acute osseous finding.  IMPRESSION: Right greater the left interstitial and hazy airspace opacities; asymmetric pulmonary edema and/or multifocal infection.   Electronically Signed   By: Carlos Levering M.D.   On: 07/09/2014 02:47   Dg Chest Port 1 View  07/05/2014   CLINICAL DATA:  End-stage renal disease.  Dialysis catheter placed today.  EXAM: PORTABLE CHEST - 1 VIEW  COMPARISON:  Chest radiograph 07/05/2014 at 15:50 hr.  FINDINGS: Current radiograph is at 17:26 hr. Left IJ dialysis catheter remains in appropriate position, with 1 of the limbs at the junction of superior vena cava and right atrium and a second limb projecting over the right atrium. Cardiomediastinal silhouette is stable. There is diffuse pulmonary vascular congestion. There are bilateral interstitial can't airspace opacities, slightly greater on the right than left, that are without significant change compared to the chest radiograph performed earlier today. No visible pleural effusion by portable technique. Negative for pneumothorax.  IMPRESSION: No significant change in pulmonary vascular congestion and bilateral interstitial and airspace opacities suggestive of volume overload/pulmonary edema.  Satisfactory position of dialysis catheter.   Electronically Signed   By: Curlene Dolphin M.D.   On: 07/05/2014 17:49   Dg Chest Port 1 View  07/05/2014   CLINICAL DATA:  Postoperative chest radiograph following surgery.  EXAM: PORTABLE CHEST - 1 VIEW  COMPARISON:  07/02/2014.  FINDINGS: Cardiomegaly is present. LEFT IJ dialysis catheter with the distal tip in the RIGHT atrium. RIGHT-greater-than-LEFT airspace disease is present, likely representing volume overload and pulmonary edema. Lung volumes are low. No pneumothorax. Monitoring leads project over the chest.  IMPRESSION: New LEFT IJ dialysis catheter with the distal tip in the RIGHT atrium. RIGHT-greater-than-LEFT airspace disease likely representing volume overload.   Electronically Signed   By: Dereck Ligas M.D.   On: 07/05/2014 16:11   Dg Chest Port 1 View  07/02/2014   CLINICAL DATA:  Follow-up will pulmonary edema ; increase dyspnea today with increased congestion ; history of hypertension, diabetes, and chronic renal insufficiency  EXAM: PORTABLE CHEST - 1 VIEW  COMPARISON:  Portable  chest x-ray of July 01, 2014  FINDINGS: The lungs are adequately inflated. The interstitial markings in the right lung are slightly more conspicuous today. On the left the interstitium is stable to slightly improved. The cardiopericardial silhouette remains mildly enlarged. The central pulmonary vascularity is slightly less conspicuous today. The mediastinum is normal in width. The right internal jugular venous catheter tip overlies the proximal portion of the SVC.  IMPRESSION: Slightly increased prominence of the pulmonary interstitium on the right may reflect asymmetric pulmonary edema or less likely interstitial pneumonia. The pulmonary interstitium on the left has improved slightly. When the patient can tolerate the procedure, a PA and lateral chest x-ray would be useful.   Electronically Signed   By: David  Martin[place]   On: 07/02/2014 08:49   Dg Chest Port 1 View  07/01/2014   CLINICAL DATA:  Acute respiratory failure, shortness of breath; acute superimposed upon chronic renal failure, diabetes, hypertension  EXAM: PORTABLE CHEST - 1 VIEW  COMPARISON:  Portable chest x-ray of June 30, 2014  FINDINGS: The lungs are adequately inflated. The interstitial markings remain increased but have improved since yesterday's study. The cardiopericardial silhouette remains enlarged. The central pulmonary vascularity is  prominent. The large caliber right internal jugular venous catheter tip overlies the proximal portion of the SVC. The bony thorax is unremarkable where visualized.  IMPRESSION: There has been slight interval improvement in the appearance of the pulmonary interstitium which may reflect resolving interstitial edema. Significant abnormalities remain however.   Electronically Signed   By: David  Martin[place]   On: 07/01/2014 07:49   Dg Chest Port 1 View  06/30/2014   CLINICAL DATA:  Respiratory failure.  EXAM: PORTABLE CHEST - 1 VIEW  COMPARISON:  06/29/2014  FINDINGS: Stable position of the right jugular  central venous catheter. Catheter tip in the upper SVC region. Again noted are bilateral airspace and interstitial markings, particularly in the right upper lung. The lung opacities have not significantly changed. Heart size is stable. Negative for a pneumothorax.  IMPRESSION: Stable interstitial and airspace densities. Findings are most compatible with pulmonary edema.  Stable position of central line.   Electronically Signed   By: Markus Daft M.D.   On: 06/30/2014 07:48   Dg Chest Port 1 View  06/29/2014   CLINICAL DATA:  Respiratory distress.  Right airspace disease  EXAM: PORTABLE CHEST - 1 VIEW  COMPARISON:  06/29/1999 fifth  FINDINGS: Large-bore right central venous catheter with tip in the distal SVC is not changed. Stable enlarged cardiac silhouette. There is bilateral airspace disease greater in the right lung which is also unchanged. No pneumothorax. Low lung volumes.  IMPRESSION: No change in bilateral airspace disease suggesting asymmetric pulmonary edema. Cannot exclude infection.   Electronically Signed   By: Suzy Bouchard M.D.   On: 06/29/2014 07:59   Dg Chest Port 1 View  06/28/2014   CLINICAL DATA:  Placement of hemodialysis catheter.  EXAM: PORTABLE CHEST - 1 VIEW  COMPARISON:  06/28/2014.  FINDINGS: Right IJ dialysis catheter noted with tip projected superior vena cava. Persistent severe diffuse right pulmonary infiltrate. Developing left pulmonary infiltrate cannot be excluded. Severe cardiomegaly. Small right pleural effusion cannot be excluded. No pneumothorax. No acute bony abnormality.  IMPRESSION: 1. Interval placement of right IJ dialysis catheter. Tip is in good anatomic position. 2. Severe diffuse right lung pulmonary infiltrate again noted. Left lower lobe mild infiltrate noted. These changes may be related to pneumonia. Pulmonary edema with asymmetric severe pulmonary edema on the right could also present in this fashion. 3. Severe cardiomegaly. 4. Small right pleural effusion  appear   Electronically Signed   By: [place]   On: 06/28/2014 13:10   Dg Chest Port 1 View  06/28/2014   CLINICAL DATA:  Shortness of breath.  EXAM: PORTABLE CHEST - 1 VIEW  COMPARISON:  02/04/ 2014.  FINDINGS: Mediastinum hilar structures are normal. Diffuse infiltrate right lung with associated atelectatic changes. Mild infiltrate left lung base cannot be excluded. Small right pleural effusion. No pneumothorax. Cardiomegaly. Normal pulmonary vascularity. No acute bony abnormality.  IMPRESSION: 1. Diffuse infiltrate right lung consistent with pneumonia. Associated atelectatic changes right lung. Small right pleural effusion. 2. Mild infiltrate left lung base cannot be excluded. 3. Cardiomegaly with normal pulmonary vascularity.   Electronically Signed   By: Marcello Moores  Register   On: 06/28/2014 07:56   Dg Abd Portable 1v  07/01/2014   CLINICAL DATA:  68 year old male on dialysis with diabetes, high blood pressure and hyperlipidemia presenting with nausea.  EXAM: PORTABLE ABDOMEN - 1 VIEW  COMPARISON:  No comparison plain film examination of the abdomen  FINDINGS: Nonspecific bowel gas pattern without plain film findings of bowel obstruction.  The possibility of free intraperitoneal air cannot be assessed on a supine view.  Vascular calcifications.  IMPRESSION: Nonspecific bowel gas pattern without plain film evidence of bowel obstruction. Please see above.   Electronically Signed   By: Chauncey Cruel M.D.   On: 07/01/2014 12:37   Dg Fluoro Guide Cv Line-no Report  07/05/2014   CLINICAL DATA:    FLOURO GUIDE CV LINE  Fluoroscopy was utilized by the requesting physician.  No radiographic  interpretation.      Today   Subjective:   Blake [name] today has no headache,no chest abdominal pain,no new weakness tingling or numbness, feels much better.  Objective:   Blood pressure 110/45, pulse 82, temperature 98 F (36.7 C), temperature source Oral, resp. rate 19, height 5\' 8"  (1.727 m),  weight 98.2 kg (216 lb 7.9 oz), SpO2 97.00%.  Intake/Output Summary (Last 24 hours) at 07/09/14 1346 Last data filed at 07/09/14 0900  Gross per 24 hour  Intake    360 ml  Output   3100 ml  Net  -2740 ml    Exam General: AAOx3, no distress  Lungs: CTA th/o w no wheeze  Cardiovascular: Regular rate and rhythm without murmur gallop or rub  Abdomen: Nontender, nondistended, soft, bowel sounds positive, no rebound, no ascites, no appreciable mass  Extremities: No significant cyanosis, clubbing; trace edema bilateral lower extremities   Data Review    CBC w Diff: Lab Results  Component Value Date   WBC 12.1* 07/08/2014   HGB 8.1* 07/08/2014   HCT 25.8* 07/08/2014   PLT 206 07/08/2014   LYMPHOPCT 11* 07/03/2014   MONOPCT 14* 07/03/2014   EOSPCT 5 07/03/2014   BASOPCT 0 07/03/2014   CMP: Lab Results  Component Value Date   NA 136* 07/08/2014   K 4.2 07/08/2014   CL 95* 07/08/2014   CO2 21 07/08/2014   BUN 43* 07/08/2014   CREATININE 9.32* 07/08/2014   PROT 8.0 07/03/2014   ALBUMIN 2.2* 07/03/2014   BILITOT 0.2* 07/03/2014   ALKPHOS 130* 07/03/2014   AST 44* 07/03/2014   ALT 36 07/03/2014  .  Micro Results Recent Results (from the past 240 hour(s))  CLOSTRIDIUM DIFFICILE BY PCR     Status: None   Collection Time    07/04/14  3:06 PM      Result Value Ref Range Status   C difficile by pcr NEGATIVE  NEGATIVE Final     Discharge Instructions      Follow-up Information   Follow up with Hinda Lenis, MD In 6 weeks. (sent message to office)    Specialty:  Vascular Surgery   Contact information:   [address] [place] [address] [phone]       Follow up with [facility], MD. Schedule an appointment as soon as possible for a visit in 1 week.   Specialty:  Internal Medicine   Contact information:   [address] [place] [address] [phone]       Discharge Medications     Medication List    STOP taking these  medications       allopurinol 100 MG tablet  Commonly known as:  ZYLOPRIM     amLODipine 10 MG tablet  Commonly known as:  NORVASC     furosemide 40 MG tablet  Commonly known as:  LASIX     NOVOLIN N RELION 100 UNIT/ML injection  Generic drug:  insulin NPH Human     NOVOLIN R RELION 100 units/mL injection  Generic drug:  insulin regular      TAKE these medications       aspirin EC 81 MG tablet  Take 81 mg by mouth daily.     atorvastatin 20 MG tablet  Commonly known as:  LIPITOR  Take 20 mg by mouth daily.     buPROPion 150 MG 24 hr tablet  Commonly known as:  WELLBUTRIN XL  Take 300 mg by mouth daily.     calcitRIOL 0.25 MCG capsule  Commonly known as:  ROCALTROL  Take 0.25 mcg by mouth daily.     calcium acetate 667 MG capsule  Commonly known as:  PHOSLO  Take 1 capsule (667 mg total) by mouth 3 (three) times daily with meals.     DULoxetine 20 MG capsule  Commonly known as:  CYMBALTA  Take 1 capsule (20 mg total) by mouth daily.     feeding supplement (NEPRO CARB STEADY) Liqd  Take 237 mLs by mouth 2 (two) times daily between meals.     insulin aspart 100 UNIT/ML injection  Commonly known as:  novoLOG  Inject 4 Units into the skin 3 (three) times daily with meals.     insulin aspart 100 UNIT/ML injection  Commonly known as:  novoLOG  Inject 0-15 Units into the skin 3 (three) times daily with meals.     insulin glargine 100 UNIT/ML injection  Commonly known as:  LANTUS  Inject 0.25 mLs (25 Units total) into the skin at bedtime.     pantoprazole 40 MG tablet  Commonly known as:  PROTONIX  Take 1 tablet (40 mg total) by mouth daily.         Total Time in preparing paper work, data evaluation and todays exam - 35 minutes  Blake [name] M.D on 07/09/2014 at [facility]  [phone]

## 2014-07-09 NOTE — Progress Notes (Signed)
Report called and given to charge nurse at [facility].

## 2014-07-15 DIAGNOSIS — J810 Acute pulmonary edema: Secondary | ICD-10-CM | POA: Insufficient documentation

## 2014-07-15 DIAGNOSIS — N172 Acute kidney failure with medullary necrosis: Secondary | ICD-10-CM | POA: Insufficient documentation

## 2014-08-22 ENCOUNTER — Encounter: Payer: Self-pay | Admitting: Vascular Surgery

## 2014-08-23 ENCOUNTER — Encounter: Payer: Self-pay | Admitting: Vascular Surgery

## 2014-08-23 ENCOUNTER — Ambulatory Visit (INDEPENDENT_AMBULATORY_CARE_PROVIDER_SITE_OTHER): Payer: Medicare Other | Admitting: Vascular Surgery

## 2014-08-23 DIAGNOSIS — N186 End stage renal disease: Secondary | ICD-10-CM

## 2014-08-23 DIAGNOSIS — Z992 Dependence on renal dialysis: Secondary | ICD-10-CM

## 2014-08-23 DIAGNOSIS — IMO0002 Reserved for concepts with insufficient information to code with codable children: Secondary | ICD-10-CM | POA: Insufficient documentation

## 2014-08-23 MED ORDER — CALCIUM ACETATE (PHOS BINDER) 667 MG PO TABS: 667.0000 mg | ORAL_TABLET | Freq: Three times a day (TID) | ORAL | Status: DC

## 2014-08-23 MED ORDER — SEVELAMER CARBONATE 800 MG PO TABS: 800.0000 mg | ORAL_TABLET | Freq: Three times a day (TID) | ORAL | Status: DC

## 2014-08-23 NOTE — Progress Notes (Signed)
    Postoperative Access Visit   History of Present Illness  Blake [name] is a 68 y.o. year old male who presents for postoperative follow-up for: [facility], R BC AVF (Date: 07/05/14).  The patient's wounds are healed.  The patient notes no steal symptoms.  The patient is able to complete their activities of daily living.  The patient's current symptoms are: intermittent right hand numbness which he had prior to his operation.  For VQI Use Only  PRE-ADM LIVING: Home  AMB STATUS: Ambulatory  Physical Examination Filed Vitals:   08/23/14 0937  BP: 122/67  Pulse: 96  Temp: 99.3 F (37.4 C)  Resp: 16    RUE: Incision is healed, skin feels warm, hand grip is 5/5, sensation in digits is intact, palpable thrill, bruit can be auscultated , on Sonosite: >6 mm throughout  Blake [name] is a 68 y.o. year old male who presents s/p L IJ TDC , R BC AVF.  The patient's access is ready for use.  The patient's tunneled dialysis catheter can be removed after two successful cannulations and completed dialysis treatments.  Thank you for allowing [place] to participate in this patient's care.  Adele Barthel, MD [facility] [place] Office: [phone] Pager: [phone]  08/23/2014, 9:59 AM

## 2014-09-15 DIAGNOSIS — E1129 Type 2 diabetes mellitus with other diabetic kidney complication: Secondary | ICD-10-CM | POA: Diagnosis not present

## 2014-09-15 DIAGNOSIS — D509 Iron deficiency anemia, unspecified: Secondary | ICD-10-CM | POA: Diagnosis not present

## 2014-09-15 DIAGNOSIS — D631 Anemia in chronic kidney disease: Secondary | ICD-10-CM | POA: Diagnosis not present

## 2014-09-15 DIAGNOSIS — D689 Coagulation defect, unspecified: Secondary | ICD-10-CM | POA: Diagnosis not present

## 2014-09-15 DIAGNOSIS — N186 End stage renal disease: Secondary | ICD-10-CM | POA: Diagnosis not present

## 2014-09-17 DIAGNOSIS — N186 End stage renal disease: Secondary | ICD-10-CM | POA: Diagnosis not present

## 2014-09-17 DIAGNOSIS — E1129 Type 2 diabetes mellitus with other diabetic kidney complication: Secondary | ICD-10-CM | POA: Diagnosis not present

## 2014-09-17 DIAGNOSIS — D509 Iron deficiency anemia, unspecified: Secondary | ICD-10-CM | POA: Diagnosis not present

## 2014-09-17 DIAGNOSIS — D689 Coagulation defect, unspecified: Secondary | ICD-10-CM | POA: Diagnosis not present

## 2014-09-17 DIAGNOSIS — D631 Anemia in chronic kidney disease: Secondary | ICD-10-CM | POA: Diagnosis not present

## 2014-09-19 DIAGNOSIS — E1129 Type 2 diabetes mellitus with other diabetic kidney complication: Secondary | ICD-10-CM | POA: Diagnosis not present

## 2014-09-19 DIAGNOSIS — D509 Iron deficiency anemia, unspecified: Secondary | ICD-10-CM | POA: Diagnosis not present

## 2014-09-19 DIAGNOSIS — N186 End stage renal disease: Secondary | ICD-10-CM | POA: Diagnosis not present

## 2014-09-19 DIAGNOSIS — D689 Coagulation defect, unspecified: Secondary | ICD-10-CM | POA: Diagnosis not present

## 2014-09-19 DIAGNOSIS — R531 Weakness: Secondary | ICD-10-CM | POA: Diagnosis not present

## 2014-09-19 DIAGNOSIS — D631 Anemia in chronic kidney disease: Secondary | ICD-10-CM | POA: Diagnosis not present

## 2014-09-20 DIAGNOSIS — Z452 Encounter for adjustment and management of vascular access device: Secondary | ICD-10-CM | POA: Diagnosis not present

## 2014-09-21 DIAGNOSIS — D509 Iron deficiency anemia, unspecified: Secondary | ICD-10-CM | POA: Diagnosis not present

## 2014-09-21 DIAGNOSIS — D689 Coagulation defect, unspecified: Secondary | ICD-10-CM | POA: Diagnosis not present

## 2014-09-21 DIAGNOSIS — E1129 Type 2 diabetes mellitus with other diabetic kidney complication: Secondary | ICD-10-CM | POA: Diagnosis not present

## 2014-09-21 DIAGNOSIS — D631 Anemia in chronic kidney disease: Secondary | ICD-10-CM | POA: Diagnosis not present

## 2014-09-21 DIAGNOSIS — N186 End stage renal disease: Secondary | ICD-10-CM | POA: Diagnosis not present

## 2014-09-24 DIAGNOSIS — D509 Iron deficiency anemia, unspecified: Secondary | ICD-10-CM | POA: Diagnosis not present

## 2014-09-24 DIAGNOSIS — N186 End stage renal disease: Secondary | ICD-10-CM | POA: Diagnosis not present

## 2014-09-24 DIAGNOSIS — E1129 Type 2 diabetes mellitus with other diabetic kidney complication: Secondary | ICD-10-CM | POA: Diagnosis not present

## 2014-09-24 DIAGNOSIS — D631 Anemia in chronic kidney disease: Secondary | ICD-10-CM | POA: Diagnosis not present

## 2014-09-24 DIAGNOSIS — D689 Coagulation defect, unspecified: Secondary | ICD-10-CM | POA: Diagnosis not present

## 2014-09-26 ENCOUNTER — Encounter (HOSPITAL_COMMUNITY): Payer: Self-pay | Admitting: Vascular Surgery

## 2014-09-26 DIAGNOSIS — D631 Anemia in chronic kidney disease: Secondary | ICD-10-CM | POA: Diagnosis not present

## 2014-09-26 DIAGNOSIS — D509 Iron deficiency anemia, unspecified: Secondary | ICD-10-CM | POA: Diagnosis not present

## 2014-09-26 DIAGNOSIS — N186 End stage renal disease: Secondary | ICD-10-CM | POA: Diagnosis not present

## 2014-09-26 DIAGNOSIS — E1129 Type 2 diabetes mellitus with other diabetic kidney complication: Secondary | ICD-10-CM | POA: Diagnosis not present

## 2014-09-26 DIAGNOSIS — D689 Coagulation defect, unspecified: Secondary | ICD-10-CM | POA: Diagnosis not present

## 2014-09-28 DIAGNOSIS — N186 End stage renal disease: Secondary | ICD-10-CM | POA: Diagnosis not present

## 2014-09-28 DIAGNOSIS — D631 Anemia in chronic kidney disease: Secondary | ICD-10-CM | POA: Diagnosis not present

## 2014-09-28 DIAGNOSIS — D509 Iron deficiency anemia, unspecified: Secondary | ICD-10-CM | POA: Diagnosis not present

## 2014-09-28 DIAGNOSIS — D689 Coagulation defect, unspecified: Secondary | ICD-10-CM | POA: Diagnosis not present

## 2014-09-28 DIAGNOSIS — E1129 Type 2 diabetes mellitus with other diabetic kidney complication: Secondary | ICD-10-CM | POA: Diagnosis not present

## 2014-10-01 DIAGNOSIS — D689 Coagulation defect, unspecified: Secondary | ICD-10-CM | POA: Diagnosis not present

## 2014-10-01 DIAGNOSIS — D509 Iron deficiency anemia, unspecified: Secondary | ICD-10-CM | POA: Diagnosis not present

## 2014-10-01 DIAGNOSIS — N186 End stage renal disease: Secondary | ICD-10-CM | POA: Diagnosis not present

## 2014-10-01 DIAGNOSIS — D631 Anemia in chronic kidney disease: Secondary | ICD-10-CM | POA: Diagnosis not present

## 2014-10-01 DIAGNOSIS — E1129 Type 2 diabetes mellitus with other diabetic kidney complication: Secondary | ICD-10-CM | POA: Diagnosis not present

## 2014-10-03 DIAGNOSIS — E1129 Type 2 diabetes mellitus with other diabetic kidney complication: Secondary | ICD-10-CM | POA: Diagnosis not present

## 2014-10-03 DIAGNOSIS — D509 Iron deficiency anemia, unspecified: Secondary | ICD-10-CM | POA: Diagnosis not present

## 2014-10-03 DIAGNOSIS — D689 Coagulation defect, unspecified: Secondary | ICD-10-CM | POA: Diagnosis not present

## 2014-10-03 DIAGNOSIS — D631 Anemia in chronic kidney disease: Secondary | ICD-10-CM | POA: Diagnosis not present

## 2014-10-03 DIAGNOSIS — N186 End stage renal disease: Secondary | ICD-10-CM | POA: Diagnosis not present

## 2014-10-05 DIAGNOSIS — E1129 Type 2 diabetes mellitus with other diabetic kidney complication: Secondary | ICD-10-CM | POA: Diagnosis not present

## 2014-10-05 DIAGNOSIS — D631 Anemia in chronic kidney disease: Secondary | ICD-10-CM | POA: Diagnosis not present

## 2014-10-05 DIAGNOSIS — N186 End stage renal disease: Secondary | ICD-10-CM | POA: Diagnosis not present

## 2014-10-05 DIAGNOSIS — D509 Iron deficiency anemia, unspecified: Secondary | ICD-10-CM | POA: Diagnosis not present

## 2014-10-05 DIAGNOSIS — D689 Coagulation defect, unspecified: Secondary | ICD-10-CM | POA: Diagnosis not present

## 2014-10-08 DIAGNOSIS — D509 Iron deficiency anemia, unspecified: Secondary | ICD-10-CM | POA: Diagnosis not present

## 2014-10-08 DIAGNOSIS — E1129 Type 2 diabetes mellitus with other diabetic kidney complication: Secondary | ICD-10-CM | POA: Diagnosis not present

## 2014-10-08 DIAGNOSIS — N186 End stage renal disease: Secondary | ICD-10-CM | POA: Diagnosis not present

## 2014-10-08 DIAGNOSIS — D631 Anemia in chronic kidney disease: Secondary | ICD-10-CM | POA: Diagnosis not present

## 2014-10-08 DIAGNOSIS — D689 Coagulation defect, unspecified: Secondary | ICD-10-CM | POA: Diagnosis not present

## 2014-10-10 DIAGNOSIS — D509 Iron deficiency anemia, unspecified: Secondary | ICD-10-CM | POA: Diagnosis not present

## 2014-10-10 DIAGNOSIS — N186 End stage renal disease: Secondary | ICD-10-CM | POA: Diagnosis not present

## 2014-10-10 DIAGNOSIS — D689 Coagulation defect, unspecified: Secondary | ICD-10-CM | POA: Diagnosis not present

## 2014-10-10 DIAGNOSIS — E1129 Type 2 diabetes mellitus with other diabetic kidney complication: Secondary | ICD-10-CM | POA: Diagnosis not present

## 2014-10-10 DIAGNOSIS — D631 Anemia in chronic kidney disease: Secondary | ICD-10-CM | POA: Diagnosis not present

## 2014-10-12 DIAGNOSIS — E1129 Type 2 diabetes mellitus with other diabetic kidney complication: Secondary | ICD-10-CM | POA: Diagnosis not present

## 2014-10-12 DIAGNOSIS — D509 Iron deficiency anemia, unspecified: Secondary | ICD-10-CM | POA: Diagnosis not present

## 2014-10-12 DIAGNOSIS — D689 Coagulation defect, unspecified: Secondary | ICD-10-CM | POA: Diagnosis not present

## 2014-10-12 DIAGNOSIS — N186 End stage renal disease: Secondary | ICD-10-CM | POA: Diagnosis not present

## 2014-10-12 DIAGNOSIS — D631 Anemia in chronic kidney disease: Secondary | ICD-10-CM | POA: Diagnosis not present

## 2014-10-13 DIAGNOSIS — Z992 Dependence on renal dialysis: Secondary | ICD-10-CM | POA: Diagnosis not present

## 2014-10-13 DIAGNOSIS — N186 End stage renal disease: Secondary | ICD-10-CM | POA: Diagnosis not present

## 2014-10-15 DIAGNOSIS — D509 Iron deficiency anemia, unspecified: Secondary | ICD-10-CM | POA: Diagnosis not present

## 2014-10-15 DIAGNOSIS — D689 Coagulation defect, unspecified: Secondary | ICD-10-CM | POA: Diagnosis not present

## 2014-10-15 DIAGNOSIS — Z23 Encounter for immunization: Secondary | ICD-10-CM | POA: Diagnosis not present

## 2014-10-15 DIAGNOSIS — N186 End stage renal disease: Secondary | ICD-10-CM | POA: Diagnosis not present

## 2014-10-15 DIAGNOSIS — D631 Anemia in chronic kidney disease: Secondary | ICD-10-CM | POA: Diagnosis not present

## 2014-10-15 DIAGNOSIS — N2581 Secondary hyperparathyroidism of renal origin: Secondary | ICD-10-CM | POA: Diagnosis not present

## 2014-10-17 DIAGNOSIS — D509 Iron deficiency anemia, unspecified: Secondary | ICD-10-CM | POA: Diagnosis not present

## 2014-10-17 DIAGNOSIS — N186 End stage renal disease: Secondary | ICD-10-CM | POA: Diagnosis not present

## 2014-10-17 DIAGNOSIS — D689 Coagulation defect, unspecified: Secondary | ICD-10-CM | POA: Diagnosis not present

## 2014-10-17 DIAGNOSIS — N2581 Secondary hyperparathyroidism of renal origin: Secondary | ICD-10-CM | POA: Diagnosis not present

## 2014-10-17 DIAGNOSIS — Z23 Encounter for immunization: Secondary | ICD-10-CM | POA: Diagnosis not present

## 2014-10-17 DIAGNOSIS — D631 Anemia in chronic kidney disease: Secondary | ICD-10-CM | POA: Diagnosis not present

## 2014-10-19 DIAGNOSIS — Z23 Encounter for immunization: Secondary | ICD-10-CM | POA: Diagnosis not present

## 2014-10-19 DIAGNOSIS — N2581 Secondary hyperparathyroidism of renal origin: Secondary | ICD-10-CM | POA: Diagnosis not present

## 2014-10-19 DIAGNOSIS — D689 Coagulation defect, unspecified: Secondary | ICD-10-CM | POA: Diagnosis not present

## 2014-10-19 DIAGNOSIS — N186 End stage renal disease: Secondary | ICD-10-CM | POA: Diagnosis not present

## 2014-10-19 DIAGNOSIS — D631 Anemia in chronic kidney disease: Secondary | ICD-10-CM | POA: Diagnosis not present

## 2014-10-19 DIAGNOSIS — D509 Iron deficiency anemia, unspecified: Secondary | ICD-10-CM | POA: Diagnosis not present

## 2014-10-20 DIAGNOSIS — R531 Weakness: Secondary | ICD-10-CM | POA: Diagnosis not present

## 2014-10-22 DIAGNOSIS — N186 End stage renal disease: Secondary | ICD-10-CM | POA: Diagnosis not present

## 2014-10-22 DIAGNOSIS — D509 Iron deficiency anemia, unspecified: Secondary | ICD-10-CM | POA: Diagnosis not present

## 2014-10-22 DIAGNOSIS — N2581 Secondary hyperparathyroidism of renal origin: Secondary | ICD-10-CM | POA: Diagnosis not present

## 2014-10-22 DIAGNOSIS — D631 Anemia in chronic kidney disease: Secondary | ICD-10-CM | POA: Diagnosis not present

## 2014-10-22 DIAGNOSIS — Z23 Encounter for immunization: Secondary | ICD-10-CM | POA: Diagnosis not present

## 2014-10-22 DIAGNOSIS — D689 Coagulation defect, unspecified: Secondary | ICD-10-CM | POA: Diagnosis not present

## 2014-10-24 DIAGNOSIS — Z23 Encounter for immunization: Secondary | ICD-10-CM | POA: Diagnosis not present

## 2014-10-24 DIAGNOSIS — D509 Iron deficiency anemia, unspecified: Secondary | ICD-10-CM | POA: Diagnosis not present

## 2014-10-24 DIAGNOSIS — D689 Coagulation defect, unspecified: Secondary | ICD-10-CM | POA: Diagnosis not present

## 2014-10-24 DIAGNOSIS — N186 End stage renal disease: Secondary | ICD-10-CM | POA: Diagnosis not present

## 2014-10-24 DIAGNOSIS — D631 Anemia in chronic kidney disease: Secondary | ICD-10-CM | POA: Diagnosis not present

## 2014-10-24 DIAGNOSIS — N2581 Secondary hyperparathyroidism of renal origin: Secondary | ICD-10-CM | POA: Diagnosis not present

## 2014-10-26 DIAGNOSIS — D689 Coagulation defect, unspecified: Secondary | ICD-10-CM | POA: Diagnosis not present

## 2014-10-26 DIAGNOSIS — D631 Anemia in chronic kidney disease: Secondary | ICD-10-CM | POA: Diagnosis not present

## 2014-10-26 DIAGNOSIS — N186 End stage renal disease: Secondary | ICD-10-CM | POA: Diagnosis not present

## 2014-10-26 DIAGNOSIS — D509 Iron deficiency anemia, unspecified: Secondary | ICD-10-CM | POA: Diagnosis not present

## 2014-10-26 DIAGNOSIS — N2581 Secondary hyperparathyroidism of renal origin: Secondary | ICD-10-CM | POA: Diagnosis not present

## 2014-10-26 DIAGNOSIS — Z23 Encounter for immunization: Secondary | ICD-10-CM | POA: Diagnosis not present

## 2014-10-29 DIAGNOSIS — N2581 Secondary hyperparathyroidism of renal origin: Secondary | ICD-10-CM | POA: Diagnosis not present

## 2014-10-29 DIAGNOSIS — D509 Iron deficiency anemia, unspecified: Secondary | ICD-10-CM | POA: Diagnosis not present

## 2014-10-29 DIAGNOSIS — D689 Coagulation defect, unspecified: Secondary | ICD-10-CM | POA: Diagnosis not present

## 2014-10-29 DIAGNOSIS — Z23 Encounter for immunization: Secondary | ICD-10-CM | POA: Diagnosis not present

## 2014-10-29 DIAGNOSIS — N186 End stage renal disease: Secondary | ICD-10-CM | POA: Diagnosis not present

## 2014-10-29 DIAGNOSIS — D631 Anemia in chronic kidney disease: Secondary | ICD-10-CM | POA: Diagnosis not present

## 2014-10-31 DIAGNOSIS — Z23 Encounter for immunization: Secondary | ICD-10-CM | POA: Diagnosis not present

## 2014-10-31 DIAGNOSIS — D509 Iron deficiency anemia, unspecified: Secondary | ICD-10-CM | POA: Diagnosis not present

## 2014-10-31 DIAGNOSIS — D631 Anemia in chronic kidney disease: Secondary | ICD-10-CM | POA: Diagnosis not present

## 2014-10-31 DIAGNOSIS — N186 End stage renal disease: Secondary | ICD-10-CM | POA: Diagnosis not present

## 2014-10-31 DIAGNOSIS — D689 Coagulation defect, unspecified: Secondary | ICD-10-CM | POA: Diagnosis not present

## 2014-10-31 DIAGNOSIS — N2581 Secondary hyperparathyroidism of renal origin: Secondary | ICD-10-CM | POA: Diagnosis not present

## 2014-11-02 DIAGNOSIS — N2581 Secondary hyperparathyroidism of renal origin: Secondary | ICD-10-CM | POA: Diagnosis not present

## 2014-11-02 DIAGNOSIS — N186 End stage renal disease: Secondary | ICD-10-CM | POA: Diagnosis not present

## 2014-11-02 DIAGNOSIS — D509 Iron deficiency anemia, unspecified: Secondary | ICD-10-CM | POA: Diagnosis not present

## 2014-11-02 DIAGNOSIS — Z23 Encounter for immunization: Secondary | ICD-10-CM | POA: Diagnosis not present

## 2014-11-02 DIAGNOSIS — D631 Anemia in chronic kidney disease: Secondary | ICD-10-CM | POA: Diagnosis not present

## 2014-11-02 DIAGNOSIS — D689 Coagulation defect, unspecified: Secondary | ICD-10-CM | POA: Diagnosis not present

## 2014-11-05 DIAGNOSIS — N186 End stage renal disease: Secondary | ICD-10-CM | POA: Diagnosis not present

## 2014-11-05 DIAGNOSIS — D631 Anemia in chronic kidney disease: Secondary | ICD-10-CM | POA: Diagnosis not present

## 2014-11-05 DIAGNOSIS — D689 Coagulation defect, unspecified: Secondary | ICD-10-CM | POA: Diagnosis not present

## 2014-11-05 DIAGNOSIS — D509 Iron deficiency anemia, unspecified: Secondary | ICD-10-CM | POA: Diagnosis not present

## 2014-11-05 DIAGNOSIS — N2581 Secondary hyperparathyroidism of renal origin: Secondary | ICD-10-CM | POA: Diagnosis not present

## 2014-11-05 DIAGNOSIS — Z23 Encounter for immunization: Secondary | ICD-10-CM | POA: Diagnosis not present

## 2014-11-07 DIAGNOSIS — N2581 Secondary hyperparathyroidism of renal origin: Secondary | ICD-10-CM | POA: Diagnosis not present

## 2014-11-07 DIAGNOSIS — N186 End stage renal disease: Secondary | ICD-10-CM | POA: Diagnosis not present

## 2014-11-07 DIAGNOSIS — D631 Anemia in chronic kidney disease: Secondary | ICD-10-CM | POA: Diagnosis not present

## 2014-11-07 DIAGNOSIS — Z23 Encounter for immunization: Secondary | ICD-10-CM | POA: Diagnosis not present

## 2014-11-07 DIAGNOSIS — D509 Iron deficiency anemia, unspecified: Secondary | ICD-10-CM | POA: Diagnosis not present

## 2014-11-07 DIAGNOSIS — D689 Coagulation defect, unspecified: Secondary | ICD-10-CM | POA: Diagnosis not present

## 2014-11-09 DIAGNOSIS — D631 Anemia in chronic kidney disease: Secondary | ICD-10-CM | POA: Diagnosis not present

## 2014-11-09 DIAGNOSIS — D689 Coagulation defect, unspecified: Secondary | ICD-10-CM | POA: Diagnosis not present

## 2014-11-09 DIAGNOSIS — N186 End stage renal disease: Secondary | ICD-10-CM | POA: Diagnosis not present

## 2014-11-09 DIAGNOSIS — D509 Iron deficiency anemia, unspecified: Secondary | ICD-10-CM | POA: Diagnosis not present

## 2014-11-09 DIAGNOSIS — N2581 Secondary hyperparathyroidism of renal origin: Secondary | ICD-10-CM | POA: Diagnosis not present

## 2014-11-09 DIAGNOSIS — Z23 Encounter for immunization: Secondary | ICD-10-CM | POA: Diagnosis not present

## 2014-11-11 DIAGNOSIS — N186 End stage renal disease: Secondary | ICD-10-CM | POA: Diagnosis not present

## 2014-11-11 DIAGNOSIS — Z992 Dependence on renal dialysis: Secondary | ICD-10-CM | POA: Diagnosis not present

## 2014-11-12 DIAGNOSIS — E1129 Type 2 diabetes mellitus with other diabetic kidney complication: Secondary | ICD-10-CM | POA: Diagnosis not present

## 2014-11-12 DIAGNOSIS — N186 End stage renal disease: Secondary | ICD-10-CM | POA: Diagnosis not present

## 2014-11-12 DIAGNOSIS — N2581 Secondary hyperparathyroidism of renal origin: Secondary | ICD-10-CM | POA: Diagnosis not present

## 2014-11-12 DIAGNOSIS — D509 Iron deficiency anemia, unspecified: Secondary | ICD-10-CM | POA: Diagnosis not present

## 2014-11-12 DIAGNOSIS — D689 Coagulation defect, unspecified: Secondary | ICD-10-CM | POA: Diagnosis not present

## 2014-11-14 DIAGNOSIS — N186 End stage renal disease: Secondary | ICD-10-CM | POA: Diagnosis not present

## 2014-11-14 DIAGNOSIS — D509 Iron deficiency anemia, unspecified: Secondary | ICD-10-CM | POA: Diagnosis not present

## 2014-11-14 DIAGNOSIS — D689 Coagulation defect, unspecified: Secondary | ICD-10-CM | POA: Diagnosis not present

## 2014-11-14 DIAGNOSIS — E1129 Type 2 diabetes mellitus with other diabetic kidney complication: Secondary | ICD-10-CM | POA: Diagnosis not present

## 2014-11-14 DIAGNOSIS — N2581 Secondary hyperparathyroidism of renal origin: Secondary | ICD-10-CM | POA: Diagnosis not present

## 2014-11-16 DIAGNOSIS — D509 Iron deficiency anemia, unspecified: Secondary | ICD-10-CM | POA: Diagnosis not present

## 2014-11-16 DIAGNOSIS — E1129 Type 2 diabetes mellitus with other diabetic kidney complication: Secondary | ICD-10-CM | POA: Diagnosis not present

## 2014-11-16 DIAGNOSIS — D689 Coagulation defect, unspecified: Secondary | ICD-10-CM | POA: Diagnosis not present

## 2014-11-16 DIAGNOSIS — N2581 Secondary hyperparathyroidism of renal origin: Secondary | ICD-10-CM | POA: Diagnosis not present

## 2014-11-16 DIAGNOSIS — N186 End stage renal disease: Secondary | ICD-10-CM | POA: Diagnosis not present

## 2014-11-18 DIAGNOSIS — R531 Weakness: Secondary | ICD-10-CM | POA: Diagnosis not present

## 2014-11-19 DIAGNOSIS — N186 End stage renal disease: Secondary | ICD-10-CM | POA: Diagnosis not present

## 2014-11-19 DIAGNOSIS — D689 Coagulation defect, unspecified: Secondary | ICD-10-CM | POA: Diagnosis not present

## 2014-11-19 DIAGNOSIS — N2581 Secondary hyperparathyroidism of renal origin: Secondary | ICD-10-CM | POA: Diagnosis not present

## 2014-11-19 DIAGNOSIS — E1129 Type 2 diabetes mellitus with other diabetic kidney complication: Secondary | ICD-10-CM | POA: Diagnosis not present

## 2014-11-19 DIAGNOSIS — D509 Iron deficiency anemia, unspecified: Secondary | ICD-10-CM | POA: Diagnosis not present

## 2014-11-21 DIAGNOSIS — E1129 Type 2 diabetes mellitus with other diabetic kidney complication: Secondary | ICD-10-CM | POA: Diagnosis not present

## 2014-11-21 DIAGNOSIS — D689 Coagulation defect, unspecified: Secondary | ICD-10-CM | POA: Diagnosis not present

## 2014-11-21 DIAGNOSIS — D509 Iron deficiency anemia, unspecified: Secondary | ICD-10-CM | POA: Diagnosis not present

## 2014-11-21 DIAGNOSIS — N2581 Secondary hyperparathyroidism of renal origin: Secondary | ICD-10-CM | POA: Diagnosis not present

## 2014-11-21 DIAGNOSIS — N186 End stage renal disease: Secondary | ICD-10-CM | POA: Diagnosis not present

## 2014-11-23 DIAGNOSIS — E1129 Type 2 diabetes mellitus with other diabetic kidney complication: Secondary | ICD-10-CM | POA: Diagnosis not present

## 2014-11-23 DIAGNOSIS — D689 Coagulation defect, unspecified: Secondary | ICD-10-CM | POA: Diagnosis not present

## 2014-11-23 DIAGNOSIS — D509 Iron deficiency anemia, unspecified: Secondary | ICD-10-CM | POA: Diagnosis not present

## 2014-11-23 DIAGNOSIS — N2581 Secondary hyperparathyroidism of renal origin: Secondary | ICD-10-CM | POA: Diagnosis not present

## 2014-11-23 DIAGNOSIS — N186 End stage renal disease: Secondary | ICD-10-CM | POA: Diagnosis not present

## 2014-11-26 DIAGNOSIS — D689 Coagulation defect, unspecified: Secondary | ICD-10-CM | POA: Diagnosis not present

## 2014-11-26 DIAGNOSIS — N2581 Secondary hyperparathyroidism of renal origin: Secondary | ICD-10-CM | POA: Diagnosis not present

## 2014-11-26 DIAGNOSIS — D509 Iron deficiency anemia, unspecified: Secondary | ICD-10-CM | POA: Diagnosis not present

## 2014-11-26 DIAGNOSIS — N186 End stage renal disease: Secondary | ICD-10-CM | POA: Diagnosis not present

## 2014-11-26 DIAGNOSIS — E1129 Type 2 diabetes mellitus with other diabetic kidney complication: Secondary | ICD-10-CM | POA: Diagnosis not present

## 2014-11-28 DIAGNOSIS — N186 End stage renal disease: Secondary | ICD-10-CM | POA: Diagnosis not present

## 2014-11-28 DIAGNOSIS — E1129 Type 2 diabetes mellitus with other diabetic kidney complication: Secondary | ICD-10-CM | POA: Diagnosis not present

## 2014-11-28 DIAGNOSIS — N2581 Secondary hyperparathyroidism of renal origin: Secondary | ICD-10-CM | POA: Diagnosis not present

## 2014-11-28 DIAGNOSIS — D689 Coagulation defect, unspecified: Secondary | ICD-10-CM | POA: Diagnosis not present

## 2014-11-28 DIAGNOSIS — D509 Iron deficiency anemia, unspecified: Secondary | ICD-10-CM | POA: Diagnosis not present

## 2014-11-30 DIAGNOSIS — D509 Iron deficiency anemia, unspecified: Secondary | ICD-10-CM | POA: Diagnosis not present

## 2014-11-30 DIAGNOSIS — D689 Coagulation defect, unspecified: Secondary | ICD-10-CM | POA: Diagnosis not present

## 2014-11-30 DIAGNOSIS — N186 End stage renal disease: Secondary | ICD-10-CM | POA: Diagnosis not present

## 2014-11-30 DIAGNOSIS — E1129 Type 2 diabetes mellitus with other diabetic kidney complication: Secondary | ICD-10-CM | POA: Diagnosis not present

## 2014-11-30 DIAGNOSIS — N2581 Secondary hyperparathyroidism of renal origin: Secondary | ICD-10-CM | POA: Diagnosis not present

## 2014-12-03 DIAGNOSIS — D509 Iron deficiency anemia, unspecified: Secondary | ICD-10-CM | POA: Diagnosis not present

## 2014-12-03 DIAGNOSIS — E1129 Type 2 diabetes mellitus with other diabetic kidney complication: Secondary | ICD-10-CM | POA: Diagnosis not present

## 2014-12-03 DIAGNOSIS — D689 Coagulation defect, unspecified: Secondary | ICD-10-CM | POA: Diagnosis not present

## 2014-12-03 DIAGNOSIS — N2581 Secondary hyperparathyroidism of renal origin: Secondary | ICD-10-CM | POA: Diagnosis not present

## 2014-12-03 DIAGNOSIS — N186 End stage renal disease: Secondary | ICD-10-CM | POA: Diagnosis not present

## 2014-12-04 DIAGNOSIS — I10 Essential (primary) hypertension: Secondary | ICD-10-CM | POA: Diagnosis not present

## 2014-12-04 DIAGNOSIS — E11311 Type 2 diabetes mellitus with unspecified diabetic retinopathy with macular edema: Secondary | ICD-10-CM | POA: Diagnosis not present

## 2014-12-04 DIAGNOSIS — E1165 Type 2 diabetes mellitus with hyperglycemia: Secondary | ICD-10-CM | POA: Diagnosis not present

## 2014-12-04 DIAGNOSIS — N189 Chronic kidney disease, unspecified: Secondary | ICD-10-CM | POA: Diagnosis not present

## 2014-12-04 DIAGNOSIS — E780 Pure hypercholesterolemia: Secondary | ICD-10-CM | POA: Diagnosis not present

## 2014-12-05 DIAGNOSIS — N2581 Secondary hyperparathyroidism of renal origin: Secondary | ICD-10-CM | POA: Diagnosis not present

## 2014-12-05 DIAGNOSIS — N186 End stage renal disease: Secondary | ICD-10-CM | POA: Diagnosis not present

## 2014-12-05 DIAGNOSIS — D509 Iron deficiency anemia, unspecified: Secondary | ICD-10-CM | POA: Diagnosis not present

## 2014-12-05 DIAGNOSIS — E1129 Type 2 diabetes mellitus with other diabetic kidney complication: Secondary | ICD-10-CM | POA: Diagnosis not present

## 2014-12-05 DIAGNOSIS — D689 Coagulation defect, unspecified: Secondary | ICD-10-CM | POA: Diagnosis not present

## 2014-12-07 DIAGNOSIS — D509 Iron deficiency anemia, unspecified: Secondary | ICD-10-CM | POA: Diagnosis not present

## 2014-12-07 DIAGNOSIS — N186 End stage renal disease: Secondary | ICD-10-CM | POA: Diagnosis not present

## 2014-12-07 DIAGNOSIS — N2581 Secondary hyperparathyroidism of renal origin: Secondary | ICD-10-CM | POA: Diagnosis not present

## 2014-12-07 DIAGNOSIS — D689 Coagulation defect, unspecified: Secondary | ICD-10-CM | POA: Diagnosis not present

## 2014-12-07 DIAGNOSIS — E1129 Type 2 diabetes mellitus with other diabetic kidney complication: Secondary | ICD-10-CM | POA: Diagnosis not present

## 2014-12-09 DIAGNOSIS — E780 Pure hypercholesterolemia: Secondary | ICD-10-CM | POA: Diagnosis not present

## 2014-12-09 DIAGNOSIS — N186 End stage renal disease: Secondary | ICD-10-CM | POA: Diagnosis not present

## 2014-12-09 DIAGNOSIS — I10 Essential (primary) hypertension: Secondary | ICD-10-CM | POA: Diagnosis not present

## 2014-12-10 DIAGNOSIS — N186 End stage renal disease: Secondary | ICD-10-CM | POA: Diagnosis not present

## 2014-12-10 DIAGNOSIS — E1129 Type 2 diabetes mellitus with other diabetic kidney complication: Secondary | ICD-10-CM | POA: Diagnosis not present

## 2014-12-10 DIAGNOSIS — D689 Coagulation defect, unspecified: Secondary | ICD-10-CM | POA: Diagnosis not present

## 2014-12-10 DIAGNOSIS — N2581 Secondary hyperparathyroidism of renal origin: Secondary | ICD-10-CM | POA: Diagnosis not present

## 2014-12-10 DIAGNOSIS — D509 Iron deficiency anemia, unspecified: Secondary | ICD-10-CM | POA: Diagnosis not present

## 2014-12-12 DIAGNOSIS — D689 Coagulation defect, unspecified: Secondary | ICD-10-CM | POA: Diagnosis not present

## 2014-12-12 DIAGNOSIS — N2581 Secondary hyperparathyroidism of renal origin: Secondary | ICD-10-CM | POA: Diagnosis not present

## 2014-12-12 DIAGNOSIS — D509 Iron deficiency anemia, unspecified: Secondary | ICD-10-CM | POA: Diagnosis not present

## 2014-12-12 DIAGNOSIS — E1129 Type 2 diabetes mellitus with other diabetic kidney complication: Secondary | ICD-10-CM | POA: Diagnosis not present

## 2014-12-12 DIAGNOSIS — N186 End stage renal disease: Secondary | ICD-10-CM | POA: Diagnosis not present

## 2014-12-12 DIAGNOSIS — E1122 Type 2 diabetes mellitus with diabetic chronic kidney disease: Secondary | ICD-10-CM | POA: Diagnosis not present

## 2014-12-12 DIAGNOSIS — Z992 Dependence on renal dialysis: Secondary | ICD-10-CM | POA: Diagnosis not present

## 2014-12-14 DIAGNOSIS — D631 Anemia in chronic kidney disease: Secondary | ICD-10-CM | POA: Diagnosis not present

## 2014-12-14 DIAGNOSIS — D509 Iron deficiency anemia, unspecified: Secondary | ICD-10-CM | POA: Diagnosis not present

## 2014-12-14 DIAGNOSIS — N186 End stage renal disease: Secondary | ICD-10-CM | POA: Diagnosis not present

## 2014-12-14 DIAGNOSIS — D689 Coagulation defect, unspecified: Secondary | ICD-10-CM | POA: Diagnosis not present

## 2014-12-14 DIAGNOSIS — E1129 Type 2 diabetes mellitus with other diabetic kidney complication: Secondary | ICD-10-CM | POA: Diagnosis not present

## 2014-12-17 DIAGNOSIS — D631 Anemia in chronic kidney disease: Secondary | ICD-10-CM | POA: Diagnosis not present

## 2014-12-17 DIAGNOSIS — N186 End stage renal disease: Secondary | ICD-10-CM | POA: Diagnosis not present

## 2014-12-17 DIAGNOSIS — D689 Coagulation defect, unspecified: Secondary | ICD-10-CM | POA: Diagnosis not present

## 2014-12-17 DIAGNOSIS — E1129 Type 2 diabetes mellitus with other diabetic kidney complication: Secondary | ICD-10-CM | POA: Diagnosis not present

## 2014-12-17 DIAGNOSIS — D509 Iron deficiency anemia, unspecified: Secondary | ICD-10-CM | POA: Diagnosis not present

## 2014-12-19 DIAGNOSIS — D509 Iron deficiency anemia, unspecified: Secondary | ICD-10-CM | POA: Diagnosis not present

## 2014-12-19 DIAGNOSIS — R531 Weakness: Secondary | ICD-10-CM | POA: Diagnosis not present

## 2014-12-19 DIAGNOSIS — E1129 Type 2 diabetes mellitus with other diabetic kidney complication: Secondary | ICD-10-CM | POA: Diagnosis not present

## 2014-12-19 DIAGNOSIS — D631 Anemia in chronic kidney disease: Secondary | ICD-10-CM | POA: Diagnosis not present

## 2014-12-19 DIAGNOSIS — N186 End stage renal disease: Secondary | ICD-10-CM | POA: Diagnosis not present

## 2014-12-19 DIAGNOSIS — D689 Coagulation defect, unspecified: Secondary | ICD-10-CM | POA: Diagnosis not present

## 2014-12-21 DIAGNOSIS — N186 End stage renal disease: Secondary | ICD-10-CM | POA: Diagnosis not present

## 2014-12-21 DIAGNOSIS — D689 Coagulation defect, unspecified: Secondary | ICD-10-CM | POA: Diagnosis not present

## 2014-12-21 DIAGNOSIS — D631 Anemia in chronic kidney disease: Secondary | ICD-10-CM | POA: Diagnosis not present

## 2014-12-21 DIAGNOSIS — E1129 Type 2 diabetes mellitus with other diabetic kidney complication: Secondary | ICD-10-CM | POA: Diagnosis not present

## 2014-12-21 DIAGNOSIS — D509 Iron deficiency anemia, unspecified: Secondary | ICD-10-CM | POA: Diagnosis not present

## 2014-12-24 DIAGNOSIS — D689 Coagulation defect, unspecified: Secondary | ICD-10-CM | POA: Diagnosis not present

## 2014-12-24 DIAGNOSIS — N186 End stage renal disease: Secondary | ICD-10-CM | POA: Diagnosis not present

## 2014-12-24 DIAGNOSIS — E1129 Type 2 diabetes mellitus with other diabetic kidney complication: Secondary | ICD-10-CM | POA: Diagnosis not present

## 2014-12-24 DIAGNOSIS — D631 Anemia in chronic kidney disease: Secondary | ICD-10-CM | POA: Diagnosis not present

## 2014-12-24 DIAGNOSIS — D509 Iron deficiency anemia, unspecified: Secondary | ICD-10-CM | POA: Diagnosis not present

## 2014-12-26 DIAGNOSIS — D689 Coagulation defect, unspecified: Secondary | ICD-10-CM | POA: Diagnosis not present

## 2014-12-26 DIAGNOSIS — D509 Iron deficiency anemia, unspecified: Secondary | ICD-10-CM | POA: Diagnosis not present

## 2014-12-26 DIAGNOSIS — E1129 Type 2 diabetes mellitus with other diabetic kidney complication: Secondary | ICD-10-CM | POA: Diagnosis not present

## 2014-12-26 DIAGNOSIS — N186 End stage renal disease: Secondary | ICD-10-CM | POA: Diagnosis not present

## 2014-12-26 DIAGNOSIS — D631 Anemia in chronic kidney disease: Secondary | ICD-10-CM | POA: Diagnosis not present

## 2014-12-28 DIAGNOSIS — D509 Iron deficiency anemia, unspecified: Secondary | ICD-10-CM | POA: Diagnosis not present

## 2014-12-28 DIAGNOSIS — D689 Coagulation defect, unspecified: Secondary | ICD-10-CM | POA: Diagnosis not present

## 2014-12-28 DIAGNOSIS — E1129 Type 2 diabetes mellitus with other diabetic kidney complication: Secondary | ICD-10-CM | POA: Diagnosis not present

## 2014-12-28 DIAGNOSIS — D631 Anemia in chronic kidney disease: Secondary | ICD-10-CM | POA: Diagnosis not present

## 2014-12-28 DIAGNOSIS — N186 End stage renal disease: Secondary | ICD-10-CM | POA: Diagnosis not present

## 2014-12-31 DIAGNOSIS — D509 Iron deficiency anemia, unspecified: Secondary | ICD-10-CM | POA: Diagnosis not present

## 2014-12-31 DIAGNOSIS — D631 Anemia in chronic kidney disease: Secondary | ICD-10-CM | POA: Diagnosis not present

## 2014-12-31 DIAGNOSIS — E1129 Type 2 diabetes mellitus with other diabetic kidney complication: Secondary | ICD-10-CM | POA: Diagnosis not present

## 2014-12-31 DIAGNOSIS — D689 Coagulation defect, unspecified: Secondary | ICD-10-CM | POA: Diagnosis not present

## 2014-12-31 DIAGNOSIS — N186 End stage renal disease: Secondary | ICD-10-CM | POA: Diagnosis not present

## 2015-01-02 DIAGNOSIS — D509 Iron deficiency anemia, unspecified: Secondary | ICD-10-CM | POA: Diagnosis not present

## 2015-01-02 DIAGNOSIS — N186 End stage renal disease: Secondary | ICD-10-CM | POA: Diagnosis not present

## 2015-01-02 DIAGNOSIS — D631 Anemia in chronic kidney disease: Secondary | ICD-10-CM | POA: Diagnosis not present

## 2015-01-02 DIAGNOSIS — D689 Coagulation defect, unspecified: Secondary | ICD-10-CM | POA: Diagnosis not present

## 2015-01-02 DIAGNOSIS — E1129 Type 2 diabetes mellitus with other diabetic kidney complication: Secondary | ICD-10-CM | POA: Diagnosis not present

## 2015-01-04 DIAGNOSIS — D509 Iron deficiency anemia, unspecified: Secondary | ICD-10-CM | POA: Diagnosis not present

## 2015-01-04 DIAGNOSIS — E161 Other hypoglycemia: Secondary | ICD-10-CM | POA: Diagnosis not present

## 2015-01-04 DIAGNOSIS — D631 Anemia in chronic kidney disease: Secondary | ICD-10-CM | POA: Diagnosis not present

## 2015-01-04 DIAGNOSIS — E1129 Type 2 diabetes mellitus with other diabetic kidney complication: Secondary | ICD-10-CM | POA: Diagnosis not present

## 2015-01-04 DIAGNOSIS — N186 End stage renal disease: Secondary | ICD-10-CM | POA: Diagnosis not present

## 2015-01-04 DIAGNOSIS — D689 Coagulation defect, unspecified: Secondary | ICD-10-CM | POA: Diagnosis not present

## 2015-01-07 DIAGNOSIS — D689 Coagulation defect, unspecified: Secondary | ICD-10-CM | POA: Diagnosis not present

## 2015-01-07 DIAGNOSIS — N186 End stage renal disease: Secondary | ICD-10-CM | POA: Diagnosis not present

## 2015-01-07 DIAGNOSIS — D509 Iron deficiency anemia, unspecified: Secondary | ICD-10-CM | POA: Diagnosis not present

## 2015-01-07 DIAGNOSIS — D631 Anemia in chronic kidney disease: Secondary | ICD-10-CM | POA: Diagnosis not present

## 2015-01-07 DIAGNOSIS — E1129 Type 2 diabetes mellitus with other diabetic kidney complication: Secondary | ICD-10-CM | POA: Diagnosis not present

## 2015-01-09 DIAGNOSIS — D631 Anemia in chronic kidney disease: Secondary | ICD-10-CM | POA: Diagnosis not present

## 2015-01-09 DIAGNOSIS — N186 End stage renal disease: Secondary | ICD-10-CM | POA: Diagnosis not present

## 2015-01-09 DIAGNOSIS — E1129 Type 2 diabetes mellitus with other diabetic kidney complication: Secondary | ICD-10-CM | POA: Diagnosis not present

## 2015-01-09 DIAGNOSIS — D689 Coagulation defect, unspecified: Secondary | ICD-10-CM | POA: Diagnosis not present

## 2015-01-09 DIAGNOSIS — D509 Iron deficiency anemia, unspecified: Secondary | ICD-10-CM | POA: Diagnosis not present

## 2015-01-11 DIAGNOSIS — E1129 Type 2 diabetes mellitus with other diabetic kidney complication: Secondary | ICD-10-CM | POA: Diagnosis not present

## 2015-01-11 DIAGNOSIS — Z992 Dependence on renal dialysis: Secondary | ICD-10-CM | POA: Diagnosis not present

## 2015-01-11 DIAGNOSIS — D689 Coagulation defect, unspecified: Secondary | ICD-10-CM | POA: Diagnosis not present

## 2015-01-11 DIAGNOSIS — D631 Anemia in chronic kidney disease: Secondary | ICD-10-CM | POA: Diagnosis not present

## 2015-01-11 DIAGNOSIS — D509 Iron deficiency anemia, unspecified: Secondary | ICD-10-CM | POA: Diagnosis not present

## 2015-01-11 DIAGNOSIS — N186 End stage renal disease: Secondary | ICD-10-CM | POA: Diagnosis not present

## 2015-01-11 DIAGNOSIS — E1122 Type 2 diabetes mellitus with diabetic chronic kidney disease: Secondary | ICD-10-CM | POA: Diagnosis not present

## 2015-01-14 DIAGNOSIS — D689 Coagulation defect, unspecified: Secondary | ICD-10-CM | POA: Diagnosis not present

## 2015-01-14 DIAGNOSIS — N186 End stage renal disease: Secondary | ICD-10-CM | POA: Diagnosis not present

## 2015-01-14 DIAGNOSIS — D509 Iron deficiency anemia, unspecified: Secondary | ICD-10-CM | POA: Diagnosis not present

## 2015-01-14 DIAGNOSIS — Z23 Encounter for immunization: Secondary | ICD-10-CM | POA: Diagnosis not present

## 2015-01-14 DIAGNOSIS — E1129 Type 2 diabetes mellitus with other diabetic kidney complication: Secondary | ICD-10-CM | POA: Diagnosis not present

## 2015-01-14 DIAGNOSIS — N2581 Secondary hyperparathyroidism of renal origin: Secondary | ICD-10-CM | POA: Diagnosis not present

## 2015-01-16 DIAGNOSIS — D689 Coagulation defect, unspecified: Secondary | ICD-10-CM | POA: Diagnosis not present

## 2015-01-16 DIAGNOSIS — Z23 Encounter for immunization: Secondary | ICD-10-CM | POA: Diagnosis not present

## 2015-01-16 DIAGNOSIS — N2581 Secondary hyperparathyroidism of renal origin: Secondary | ICD-10-CM | POA: Diagnosis not present

## 2015-01-16 DIAGNOSIS — N186 End stage renal disease: Secondary | ICD-10-CM | POA: Diagnosis not present

## 2015-01-16 DIAGNOSIS — D509 Iron deficiency anemia, unspecified: Secondary | ICD-10-CM | POA: Diagnosis not present

## 2015-01-16 DIAGNOSIS — E1129 Type 2 diabetes mellitus with other diabetic kidney complication: Secondary | ICD-10-CM | POA: Diagnosis not present

## 2015-01-18 DIAGNOSIS — E1129 Type 2 diabetes mellitus with other diabetic kidney complication: Secondary | ICD-10-CM | POA: Diagnosis not present

## 2015-01-18 DIAGNOSIS — Z23 Encounter for immunization: Secondary | ICD-10-CM | POA: Diagnosis not present

## 2015-01-18 DIAGNOSIS — N2581 Secondary hyperparathyroidism of renal origin: Secondary | ICD-10-CM | POA: Diagnosis not present

## 2015-01-18 DIAGNOSIS — N186 End stage renal disease: Secondary | ICD-10-CM | POA: Diagnosis not present

## 2015-01-18 DIAGNOSIS — D689 Coagulation defect, unspecified: Secondary | ICD-10-CM | POA: Diagnosis not present

## 2015-01-18 DIAGNOSIS — D509 Iron deficiency anemia, unspecified: Secondary | ICD-10-CM | POA: Diagnosis not present

## 2015-01-18 DIAGNOSIS — R531 Weakness: Secondary | ICD-10-CM | POA: Diagnosis not present

## 2015-01-21 DIAGNOSIS — N186 End stage renal disease: Secondary | ICD-10-CM | POA: Diagnosis not present

## 2015-01-21 DIAGNOSIS — D689 Coagulation defect, unspecified: Secondary | ICD-10-CM | POA: Diagnosis not present

## 2015-01-21 DIAGNOSIS — N2581 Secondary hyperparathyroidism of renal origin: Secondary | ICD-10-CM | POA: Diagnosis not present

## 2015-01-21 DIAGNOSIS — Z23 Encounter for immunization: Secondary | ICD-10-CM | POA: Diagnosis not present

## 2015-01-21 DIAGNOSIS — D509 Iron deficiency anemia, unspecified: Secondary | ICD-10-CM | POA: Diagnosis not present

## 2015-01-21 DIAGNOSIS — E1129 Type 2 diabetes mellitus with other diabetic kidney complication: Secondary | ICD-10-CM | POA: Diagnosis not present

## 2015-01-23 DIAGNOSIS — N186 End stage renal disease: Secondary | ICD-10-CM | POA: Diagnosis not present

## 2015-01-23 DIAGNOSIS — E1129 Type 2 diabetes mellitus with other diabetic kidney complication: Secondary | ICD-10-CM | POA: Diagnosis not present

## 2015-01-23 DIAGNOSIS — Z23 Encounter for immunization: Secondary | ICD-10-CM | POA: Diagnosis not present

## 2015-01-23 DIAGNOSIS — D509 Iron deficiency anemia, unspecified: Secondary | ICD-10-CM | POA: Diagnosis not present

## 2015-01-23 DIAGNOSIS — N2581 Secondary hyperparathyroidism of renal origin: Secondary | ICD-10-CM | POA: Diagnosis not present

## 2015-01-23 DIAGNOSIS — D689 Coagulation defect, unspecified: Secondary | ICD-10-CM | POA: Diagnosis not present

## 2015-01-25 DIAGNOSIS — D509 Iron deficiency anemia, unspecified: Secondary | ICD-10-CM | POA: Diagnosis not present

## 2015-01-25 DIAGNOSIS — Z23 Encounter for immunization: Secondary | ICD-10-CM | POA: Diagnosis not present

## 2015-01-25 DIAGNOSIS — E1129 Type 2 diabetes mellitus with other diabetic kidney complication: Secondary | ICD-10-CM | POA: Diagnosis not present

## 2015-01-25 DIAGNOSIS — N2581 Secondary hyperparathyroidism of renal origin: Secondary | ICD-10-CM | POA: Diagnosis not present

## 2015-01-25 DIAGNOSIS — N186 End stage renal disease: Secondary | ICD-10-CM | POA: Diagnosis not present

## 2015-01-25 DIAGNOSIS — D689 Coagulation defect, unspecified: Secondary | ICD-10-CM | POA: Diagnosis not present

## 2015-01-28 DIAGNOSIS — D509 Iron deficiency anemia, unspecified: Secondary | ICD-10-CM | POA: Diagnosis not present

## 2015-01-28 DIAGNOSIS — E1129 Type 2 diabetes mellitus with other diabetic kidney complication: Secondary | ICD-10-CM | POA: Diagnosis not present

## 2015-01-28 DIAGNOSIS — D689 Coagulation defect, unspecified: Secondary | ICD-10-CM | POA: Diagnosis not present

## 2015-01-28 DIAGNOSIS — N2581 Secondary hyperparathyroidism of renal origin: Secondary | ICD-10-CM | POA: Diagnosis not present

## 2015-01-28 DIAGNOSIS — Z23 Encounter for immunization: Secondary | ICD-10-CM | POA: Diagnosis not present

## 2015-01-28 DIAGNOSIS — N186 End stage renal disease: Secondary | ICD-10-CM | POA: Diagnosis not present

## 2015-01-30 DIAGNOSIS — N2581 Secondary hyperparathyroidism of renal origin: Secondary | ICD-10-CM | POA: Diagnosis not present

## 2015-01-30 DIAGNOSIS — D509 Iron deficiency anemia, unspecified: Secondary | ICD-10-CM | POA: Diagnosis not present

## 2015-01-30 DIAGNOSIS — D689 Coagulation defect, unspecified: Secondary | ICD-10-CM | POA: Diagnosis not present

## 2015-01-30 DIAGNOSIS — Z23 Encounter for immunization: Secondary | ICD-10-CM | POA: Diagnosis not present

## 2015-01-30 DIAGNOSIS — N186 End stage renal disease: Secondary | ICD-10-CM | POA: Diagnosis not present

## 2015-01-30 DIAGNOSIS — E1129 Type 2 diabetes mellitus with other diabetic kidney complication: Secondary | ICD-10-CM | POA: Diagnosis not present

## 2015-02-01 DIAGNOSIS — E1129 Type 2 diabetes mellitus with other diabetic kidney complication: Secondary | ICD-10-CM | POA: Diagnosis not present

## 2015-02-01 DIAGNOSIS — N186 End stage renal disease: Secondary | ICD-10-CM | POA: Diagnosis not present

## 2015-02-01 DIAGNOSIS — D509 Iron deficiency anemia, unspecified: Secondary | ICD-10-CM | POA: Diagnosis not present

## 2015-02-01 DIAGNOSIS — D689 Coagulation defect, unspecified: Secondary | ICD-10-CM | POA: Diagnosis not present

## 2015-02-01 DIAGNOSIS — Z23 Encounter for immunization: Secondary | ICD-10-CM | POA: Diagnosis not present

## 2015-02-01 DIAGNOSIS — N2581 Secondary hyperparathyroidism of renal origin: Secondary | ICD-10-CM | POA: Diagnosis not present

## 2015-02-04 DIAGNOSIS — N186 End stage renal disease: Secondary | ICD-10-CM | POA: Diagnosis not present

## 2015-02-04 DIAGNOSIS — Z23 Encounter for immunization: Secondary | ICD-10-CM | POA: Diagnosis not present

## 2015-02-04 DIAGNOSIS — D689 Coagulation defect, unspecified: Secondary | ICD-10-CM | POA: Diagnosis not present

## 2015-02-04 DIAGNOSIS — E1129 Type 2 diabetes mellitus with other diabetic kidney complication: Secondary | ICD-10-CM | POA: Diagnosis not present

## 2015-02-04 DIAGNOSIS — D509 Iron deficiency anemia, unspecified: Secondary | ICD-10-CM | POA: Diagnosis not present

## 2015-02-04 DIAGNOSIS — N2581 Secondary hyperparathyroidism of renal origin: Secondary | ICD-10-CM | POA: Diagnosis not present

## 2015-02-06 DIAGNOSIS — N2581 Secondary hyperparathyroidism of renal origin: Secondary | ICD-10-CM | POA: Diagnosis not present

## 2015-02-06 DIAGNOSIS — D689 Coagulation defect, unspecified: Secondary | ICD-10-CM | POA: Diagnosis not present

## 2015-02-06 DIAGNOSIS — D509 Iron deficiency anemia, unspecified: Secondary | ICD-10-CM | POA: Diagnosis not present

## 2015-02-06 DIAGNOSIS — E1129 Type 2 diabetes mellitus with other diabetic kidney complication: Secondary | ICD-10-CM | POA: Diagnosis not present

## 2015-02-06 DIAGNOSIS — N186 End stage renal disease: Secondary | ICD-10-CM | POA: Diagnosis not present

## 2015-02-06 DIAGNOSIS — Z23 Encounter for immunization: Secondary | ICD-10-CM | POA: Diagnosis not present

## 2015-02-08 DIAGNOSIS — Z23 Encounter for immunization: Secondary | ICD-10-CM | POA: Diagnosis not present

## 2015-02-08 DIAGNOSIS — N2581 Secondary hyperparathyroidism of renal origin: Secondary | ICD-10-CM | POA: Diagnosis not present

## 2015-02-08 DIAGNOSIS — N186 End stage renal disease: Secondary | ICD-10-CM | POA: Diagnosis not present

## 2015-02-08 DIAGNOSIS — D689 Coagulation defect, unspecified: Secondary | ICD-10-CM | POA: Diagnosis not present

## 2015-02-08 DIAGNOSIS — D509 Iron deficiency anemia, unspecified: Secondary | ICD-10-CM | POA: Diagnosis not present

## 2015-02-08 DIAGNOSIS — E1129 Type 2 diabetes mellitus with other diabetic kidney complication: Secondary | ICD-10-CM | POA: Diagnosis not present

## 2015-02-11 DIAGNOSIS — E1122 Type 2 diabetes mellitus with diabetic chronic kidney disease: Secondary | ICD-10-CM | POA: Diagnosis not present

## 2015-02-11 DIAGNOSIS — Z992 Dependence on renal dialysis: Secondary | ICD-10-CM | POA: Diagnosis not present

## 2015-02-11 DIAGNOSIS — N2581 Secondary hyperparathyroidism of renal origin: Secondary | ICD-10-CM | POA: Diagnosis not present

## 2015-02-11 DIAGNOSIS — D509 Iron deficiency anemia, unspecified: Secondary | ICD-10-CM | POA: Diagnosis not present

## 2015-02-11 DIAGNOSIS — Z23 Encounter for immunization: Secondary | ICD-10-CM | POA: Diagnosis not present

## 2015-02-11 DIAGNOSIS — E1129 Type 2 diabetes mellitus with other diabetic kidney complication: Secondary | ICD-10-CM | POA: Diagnosis not present

## 2015-02-11 DIAGNOSIS — D689 Coagulation defect, unspecified: Secondary | ICD-10-CM | POA: Diagnosis not present

## 2015-02-11 DIAGNOSIS — N186 End stage renal disease: Secondary | ICD-10-CM | POA: Diagnosis not present

## 2015-02-13 DIAGNOSIS — N186 End stage renal disease: Secondary | ICD-10-CM | POA: Diagnosis not present

## 2015-02-13 DIAGNOSIS — E1129 Type 2 diabetes mellitus with other diabetic kidney complication: Secondary | ICD-10-CM | POA: Diagnosis not present

## 2015-02-13 DIAGNOSIS — D509 Iron deficiency anemia, unspecified: Secondary | ICD-10-CM | POA: Diagnosis not present

## 2015-02-13 DIAGNOSIS — D689 Coagulation defect, unspecified: Secondary | ICD-10-CM | POA: Diagnosis not present

## 2015-02-13 DIAGNOSIS — Z23 Encounter for immunization: Secondary | ICD-10-CM | POA: Diagnosis not present

## 2015-02-15 DIAGNOSIS — N186 End stage renal disease: Secondary | ICD-10-CM | POA: Diagnosis not present

## 2015-02-15 DIAGNOSIS — D509 Iron deficiency anemia, unspecified: Secondary | ICD-10-CM | POA: Diagnosis not present

## 2015-02-15 DIAGNOSIS — Z23 Encounter for immunization: Secondary | ICD-10-CM | POA: Diagnosis not present

## 2015-02-15 DIAGNOSIS — D689 Coagulation defect, unspecified: Secondary | ICD-10-CM | POA: Diagnosis not present

## 2015-02-15 DIAGNOSIS — E1129 Type 2 diabetes mellitus with other diabetic kidney complication: Secondary | ICD-10-CM | POA: Diagnosis not present

## 2015-02-18 DIAGNOSIS — R531 Weakness: Secondary | ICD-10-CM | POA: Diagnosis not present

## 2015-02-18 DIAGNOSIS — D689 Coagulation defect, unspecified: Secondary | ICD-10-CM | POA: Diagnosis not present

## 2015-02-18 DIAGNOSIS — N186 End stage renal disease: Secondary | ICD-10-CM | POA: Diagnosis not present

## 2015-02-18 DIAGNOSIS — Z23 Encounter for immunization: Secondary | ICD-10-CM | POA: Diagnosis not present

## 2015-02-18 DIAGNOSIS — D509 Iron deficiency anemia, unspecified: Secondary | ICD-10-CM | POA: Diagnosis not present

## 2015-02-18 DIAGNOSIS — E1129 Type 2 diabetes mellitus with other diabetic kidney complication: Secondary | ICD-10-CM | POA: Diagnosis not present

## 2015-02-20 DIAGNOSIS — Z23 Encounter for immunization: Secondary | ICD-10-CM | POA: Diagnosis not present

## 2015-02-20 DIAGNOSIS — D689 Coagulation defect, unspecified: Secondary | ICD-10-CM | POA: Diagnosis not present

## 2015-02-20 DIAGNOSIS — N186 End stage renal disease: Secondary | ICD-10-CM | POA: Diagnosis not present

## 2015-02-20 DIAGNOSIS — D509 Iron deficiency anemia, unspecified: Secondary | ICD-10-CM | POA: Diagnosis not present

## 2015-02-20 DIAGNOSIS — E1129 Type 2 diabetes mellitus with other diabetic kidney complication: Secondary | ICD-10-CM | POA: Diagnosis not present

## 2015-02-22 DIAGNOSIS — D689 Coagulation defect, unspecified: Secondary | ICD-10-CM | POA: Diagnosis not present

## 2015-02-22 DIAGNOSIS — N186 End stage renal disease: Secondary | ICD-10-CM | POA: Diagnosis not present

## 2015-02-22 DIAGNOSIS — E1129 Type 2 diabetes mellitus with other diabetic kidney complication: Secondary | ICD-10-CM | POA: Diagnosis not present

## 2015-02-22 DIAGNOSIS — Z23 Encounter for immunization: Secondary | ICD-10-CM | POA: Diagnosis not present

## 2015-02-22 DIAGNOSIS — D509 Iron deficiency anemia, unspecified: Secondary | ICD-10-CM | POA: Diagnosis not present

## 2015-02-25 DIAGNOSIS — N186 End stage renal disease: Secondary | ICD-10-CM | POA: Diagnosis not present

## 2015-02-25 DIAGNOSIS — E1129 Type 2 diabetes mellitus with other diabetic kidney complication: Secondary | ICD-10-CM | POA: Diagnosis not present

## 2015-02-25 DIAGNOSIS — D509 Iron deficiency anemia, unspecified: Secondary | ICD-10-CM | POA: Diagnosis not present

## 2015-02-25 DIAGNOSIS — D689 Coagulation defect, unspecified: Secondary | ICD-10-CM | POA: Diagnosis not present

## 2015-02-25 DIAGNOSIS — Z23 Encounter for immunization: Secondary | ICD-10-CM | POA: Diagnosis not present

## 2015-02-27 DIAGNOSIS — E1129 Type 2 diabetes mellitus with other diabetic kidney complication: Secondary | ICD-10-CM | POA: Diagnosis not present

## 2015-02-27 DIAGNOSIS — D509 Iron deficiency anemia, unspecified: Secondary | ICD-10-CM | POA: Diagnosis not present

## 2015-02-27 DIAGNOSIS — Z23 Encounter for immunization: Secondary | ICD-10-CM | POA: Diagnosis not present

## 2015-02-27 DIAGNOSIS — N186 End stage renal disease: Secondary | ICD-10-CM | POA: Diagnosis not present

## 2015-02-27 DIAGNOSIS — D689 Coagulation defect, unspecified: Secondary | ICD-10-CM | POA: Diagnosis not present

## 2015-03-01 DIAGNOSIS — E1129 Type 2 diabetes mellitus with other diabetic kidney complication: Secondary | ICD-10-CM | POA: Diagnosis not present

## 2015-03-01 DIAGNOSIS — N186 End stage renal disease: Secondary | ICD-10-CM | POA: Diagnosis not present

## 2015-03-01 DIAGNOSIS — Z23 Encounter for immunization: Secondary | ICD-10-CM | POA: Diagnosis not present

## 2015-03-01 DIAGNOSIS — D689 Coagulation defect, unspecified: Secondary | ICD-10-CM | POA: Diagnosis not present

## 2015-03-01 DIAGNOSIS — D509 Iron deficiency anemia, unspecified: Secondary | ICD-10-CM | POA: Diagnosis not present

## 2015-03-04 DIAGNOSIS — D689 Coagulation defect, unspecified: Secondary | ICD-10-CM | POA: Diagnosis not present

## 2015-03-04 DIAGNOSIS — Z23 Encounter for immunization: Secondary | ICD-10-CM | POA: Diagnosis not present

## 2015-03-04 DIAGNOSIS — D509 Iron deficiency anemia, unspecified: Secondary | ICD-10-CM | POA: Diagnosis not present

## 2015-03-04 DIAGNOSIS — N186 End stage renal disease: Secondary | ICD-10-CM | POA: Diagnosis not present

## 2015-03-04 DIAGNOSIS — E1129 Type 2 diabetes mellitus with other diabetic kidney complication: Secondary | ICD-10-CM | POA: Diagnosis not present

## 2015-03-05 DIAGNOSIS — Z79899 Other long term (current) drug therapy: Secondary | ICD-10-CM | POA: Diagnosis not present

## 2015-03-05 DIAGNOSIS — E119 Type 2 diabetes mellitus without complications: Secondary | ICD-10-CM | POA: Diagnosis not present

## 2015-03-05 DIAGNOSIS — L03031 Cellulitis of right toe: Secondary | ICD-10-CM | POA: Diagnosis not present

## 2015-03-06 DIAGNOSIS — E1129 Type 2 diabetes mellitus with other diabetic kidney complication: Secondary | ICD-10-CM | POA: Diagnosis not present

## 2015-03-06 DIAGNOSIS — D509 Iron deficiency anemia, unspecified: Secondary | ICD-10-CM | POA: Diagnosis not present

## 2015-03-06 DIAGNOSIS — N186 End stage renal disease: Secondary | ICD-10-CM | POA: Diagnosis not present

## 2015-03-06 DIAGNOSIS — Z23 Encounter for immunization: Secondary | ICD-10-CM | POA: Diagnosis not present

## 2015-03-06 DIAGNOSIS — D689 Coagulation defect, unspecified: Secondary | ICD-10-CM | POA: Diagnosis not present

## 2015-03-11 DIAGNOSIS — N186 End stage renal disease: Secondary | ICD-10-CM | POA: Diagnosis not present

## 2015-03-11 DIAGNOSIS — D689 Coagulation defect, unspecified: Secondary | ICD-10-CM | POA: Diagnosis not present

## 2015-03-11 DIAGNOSIS — Z23 Encounter for immunization: Secondary | ICD-10-CM | POA: Diagnosis not present

## 2015-03-11 DIAGNOSIS — E1129 Type 2 diabetes mellitus with other diabetic kidney complication: Secondary | ICD-10-CM | POA: Diagnosis not present

## 2015-03-11 DIAGNOSIS — D509 Iron deficiency anemia, unspecified: Secondary | ICD-10-CM | POA: Diagnosis not present

## 2015-03-13 DIAGNOSIS — E1122 Type 2 diabetes mellitus with diabetic chronic kidney disease: Secondary | ICD-10-CM | POA: Diagnosis not present

## 2015-03-13 DIAGNOSIS — Z992 Dependence on renal dialysis: Secondary | ICD-10-CM | POA: Diagnosis not present

## 2015-03-13 DIAGNOSIS — N186 End stage renal disease: Secondary | ICD-10-CM | POA: Diagnosis not present

## 2015-03-14 DIAGNOSIS — E1129 Type 2 diabetes mellitus with other diabetic kidney complication: Secondary | ICD-10-CM | POA: Diagnosis not present

## 2015-03-14 DIAGNOSIS — N186 End stage renal disease: Secondary | ICD-10-CM | POA: Diagnosis not present

## 2015-03-14 DIAGNOSIS — D509 Iron deficiency anemia, unspecified: Secondary | ICD-10-CM | POA: Diagnosis not present

## 2015-03-14 DIAGNOSIS — D689 Coagulation defect, unspecified: Secondary | ICD-10-CM | POA: Diagnosis not present

## 2015-03-18 DIAGNOSIS — N186 End stage renal disease: Secondary | ICD-10-CM | POA: Diagnosis not present

## 2015-03-18 DIAGNOSIS — D689 Coagulation defect, unspecified: Secondary | ICD-10-CM | POA: Diagnosis not present

## 2015-03-18 DIAGNOSIS — E1129 Type 2 diabetes mellitus with other diabetic kidney complication: Secondary | ICD-10-CM | POA: Diagnosis not present

## 2015-03-18 DIAGNOSIS — D509 Iron deficiency anemia, unspecified: Secondary | ICD-10-CM | POA: Diagnosis not present

## 2015-03-20 DIAGNOSIS — N186 End stage renal disease: Secondary | ICD-10-CM | POA: Diagnosis not present

## 2015-03-20 DIAGNOSIS — E1129 Type 2 diabetes mellitus with other diabetic kidney complication: Secondary | ICD-10-CM | POA: Diagnosis not present

## 2015-03-20 DIAGNOSIS — D689 Coagulation defect, unspecified: Secondary | ICD-10-CM | POA: Diagnosis not present

## 2015-03-20 DIAGNOSIS — R531 Weakness: Secondary | ICD-10-CM | POA: Diagnosis not present

## 2015-03-20 DIAGNOSIS — D509 Iron deficiency anemia, unspecified: Secondary | ICD-10-CM | POA: Diagnosis not present

## 2015-03-22 DIAGNOSIS — D509 Iron deficiency anemia, unspecified: Secondary | ICD-10-CM | POA: Diagnosis not present

## 2015-03-22 DIAGNOSIS — D689 Coagulation defect, unspecified: Secondary | ICD-10-CM | POA: Diagnosis not present

## 2015-03-22 DIAGNOSIS — E1129 Type 2 diabetes mellitus with other diabetic kidney complication: Secondary | ICD-10-CM | POA: Diagnosis not present

## 2015-03-22 DIAGNOSIS — N186 End stage renal disease: Secondary | ICD-10-CM | POA: Diagnosis not present

## 2015-03-25 DIAGNOSIS — N186 End stage renal disease: Secondary | ICD-10-CM | POA: Diagnosis not present

## 2015-03-25 DIAGNOSIS — E1129 Type 2 diabetes mellitus with other diabetic kidney complication: Secondary | ICD-10-CM | POA: Diagnosis not present

## 2015-03-25 DIAGNOSIS — D509 Iron deficiency anemia, unspecified: Secondary | ICD-10-CM | POA: Diagnosis not present

## 2015-03-25 DIAGNOSIS — D689 Coagulation defect, unspecified: Secondary | ICD-10-CM | POA: Diagnosis not present

## 2015-03-27 DIAGNOSIS — N186 End stage renal disease: Secondary | ICD-10-CM | POA: Diagnosis not present

## 2015-03-27 DIAGNOSIS — D509 Iron deficiency anemia, unspecified: Secondary | ICD-10-CM | POA: Diagnosis not present

## 2015-03-27 DIAGNOSIS — E1129 Type 2 diabetes mellitus with other diabetic kidney complication: Secondary | ICD-10-CM | POA: Diagnosis not present

## 2015-03-27 DIAGNOSIS — D689 Coagulation defect, unspecified: Secondary | ICD-10-CM | POA: Diagnosis not present

## 2015-03-29 DIAGNOSIS — D689 Coagulation defect, unspecified: Secondary | ICD-10-CM | POA: Diagnosis not present

## 2015-03-29 DIAGNOSIS — N186 End stage renal disease: Secondary | ICD-10-CM | POA: Diagnosis not present

## 2015-03-29 DIAGNOSIS — E1129 Type 2 diabetes mellitus with other diabetic kidney complication: Secondary | ICD-10-CM | POA: Diagnosis not present

## 2015-03-29 DIAGNOSIS — D509 Iron deficiency anemia, unspecified: Secondary | ICD-10-CM | POA: Diagnosis not present

## 2015-04-01 DIAGNOSIS — D509 Iron deficiency anemia, unspecified: Secondary | ICD-10-CM | POA: Diagnosis not present

## 2015-04-01 DIAGNOSIS — N186 End stage renal disease: Secondary | ICD-10-CM | POA: Diagnosis not present

## 2015-04-01 DIAGNOSIS — D689 Coagulation defect, unspecified: Secondary | ICD-10-CM | POA: Diagnosis not present

## 2015-04-01 DIAGNOSIS — E1129 Type 2 diabetes mellitus with other diabetic kidney complication: Secondary | ICD-10-CM | POA: Diagnosis not present

## 2015-04-03 DIAGNOSIS — D509 Iron deficiency anemia, unspecified: Secondary | ICD-10-CM | POA: Diagnosis not present

## 2015-04-03 DIAGNOSIS — D689 Coagulation defect, unspecified: Secondary | ICD-10-CM | POA: Diagnosis not present

## 2015-04-03 DIAGNOSIS — E1129 Type 2 diabetes mellitus with other diabetic kidney complication: Secondary | ICD-10-CM | POA: Diagnosis not present

## 2015-04-03 DIAGNOSIS — N186 End stage renal disease: Secondary | ICD-10-CM | POA: Diagnosis not present

## 2015-04-05 DIAGNOSIS — D509 Iron deficiency anemia, unspecified: Secondary | ICD-10-CM | POA: Diagnosis not present

## 2015-04-05 DIAGNOSIS — N186 End stage renal disease: Secondary | ICD-10-CM | POA: Diagnosis not present

## 2015-04-05 DIAGNOSIS — D689 Coagulation defect, unspecified: Secondary | ICD-10-CM | POA: Diagnosis not present

## 2015-04-05 DIAGNOSIS — E1129 Type 2 diabetes mellitus with other diabetic kidney complication: Secondary | ICD-10-CM | POA: Diagnosis not present

## 2015-04-10 DIAGNOSIS — N186 End stage renal disease: Secondary | ICD-10-CM | POA: Diagnosis not present

## 2015-04-10 DIAGNOSIS — D689 Coagulation defect, unspecified: Secondary | ICD-10-CM | POA: Diagnosis not present

## 2015-04-10 DIAGNOSIS — D509 Iron deficiency anemia, unspecified: Secondary | ICD-10-CM | POA: Diagnosis not present

## 2015-04-10 DIAGNOSIS — E1129 Type 2 diabetes mellitus with other diabetic kidney complication: Secondary | ICD-10-CM | POA: Diagnosis not present

## 2015-04-12 DIAGNOSIS — D689 Coagulation defect, unspecified: Secondary | ICD-10-CM | POA: Diagnosis not present

## 2015-04-12 DIAGNOSIS — N186 End stage renal disease: Secondary | ICD-10-CM | POA: Diagnosis not present

## 2015-04-12 DIAGNOSIS — E1129 Type 2 diabetes mellitus with other diabetic kidney complication: Secondary | ICD-10-CM | POA: Diagnosis not present

## 2015-04-12 DIAGNOSIS — D509 Iron deficiency anemia, unspecified: Secondary | ICD-10-CM | POA: Diagnosis not present

## 2015-04-13 DIAGNOSIS — Z992 Dependence on renal dialysis: Secondary | ICD-10-CM | POA: Diagnosis not present

## 2015-04-13 DIAGNOSIS — E1122 Type 2 diabetes mellitus with diabetic chronic kidney disease: Secondary | ICD-10-CM | POA: Diagnosis not present

## 2015-04-13 DIAGNOSIS — N186 End stage renal disease: Secondary | ICD-10-CM | POA: Diagnosis not present

## 2015-04-15 DIAGNOSIS — D509 Iron deficiency anemia, unspecified: Secondary | ICD-10-CM | POA: Diagnosis not present

## 2015-04-15 DIAGNOSIS — D689 Coagulation defect, unspecified: Secondary | ICD-10-CM | POA: Diagnosis not present

## 2015-04-15 DIAGNOSIS — Z23 Encounter for immunization: Secondary | ICD-10-CM | POA: Diagnosis not present

## 2015-04-15 DIAGNOSIS — E1129 Type 2 diabetes mellitus with other diabetic kidney complication: Secondary | ICD-10-CM | POA: Diagnosis not present

## 2015-04-15 DIAGNOSIS — N186 End stage renal disease: Secondary | ICD-10-CM | POA: Diagnosis not present

## 2015-04-16 DIAGNOSIS — E780 Pure hypercholesterolemia: Secondary | ICD-10-CM | POA: Diagnosis not present

## 2015-04-16 DIAGNOSIS — Z1389 Encounter for screening for other disorder: Secondary | ICD-10-CM | POA: Diagnosis not present

## 2015-04-16 DIAGNOSIS — I10 Essential (primary) hypertension: Secondary | ICD-10-CM | POA: Diagnosis not present

## 2015-04-16 DIAGNOSIS — M1A0790 Idiopathic chronic gout, unspecified ankle and foot, without tophus (tophi): Secondary | ICD-10-CM | POA: Diagnosis not present

## 2015-04-17 DIAGNOSIS — D689 Coagulation defect, unspecified: Secondary | ICD-10-CM | POA: Diagnosis not present

## 2015-04-17 DIAGNOSIS — D509 Iron deficiency anemia, unspecified: Secondary | ICD-10-CM | POA: Diagnosis not present

## 2015-04-17 DIAGNOSIS — N186 End stage renal disease: Secondary | ICD-10-CM | POA: Diagnosis not present

## 2015-04-17 DIAGNOSIS — Z23 Encounter for immunization: Secondary | ICD-10-CM | POA: Diagnosis not present

## 2015-04-17 DIAGNOSIS — E1129 Type 2 diabetes mellitus with other diabetic kidney complication: Secondary | ICD-10-CM | POA: Diagnosis not present

## 2015-04-18 ENCOUNTER — Other Ambulatory Visit: Payer: Self-pay | Admitting: *Deleted

## 2015-04-18 DIAGNOSIS — L98499 Non-pressure chronic ulcer of skin of other sites with unspecified severity: Secondary | ICD-10-CM

## 2015-04-18 DIAGNOSIS — R202 Paresthesia of skin: Secondary | ICD-10-CM

## 2015-04-19 DIAGNOSIS — E1129 Type 2 diabetes mellitus with other diabetic kidney complication: Secondary | ICD-10-CM | POA: Diagnosis not present

## 2015-04-19 DIAGNOSIS — D509 Iron deficiency anemia, unspecified: Secondary | ICD-10-CM | POA: Diagnosis not present

## 2015-04-19 DIAGNOSIS — Z23 Encounter for immunization: Secondary | ICD-10-CM | POA: Diagnosis not present

## 2015-04-19 DIAGNOSIS — D689 Coagulation defect, unspecified: Secondary | ICD-10-CM | POA: Diagnosis not present

## 2015-04-19 DIAGNOSIS — N186 End stage renal disease: Secondary | ICD-10-CM | POA: Diagnosis not present

## 2015-04-20 DIAGNOSIS — R531 Weakness: Secondary | ICD-10-CM | POA: Diagnosis not present

## 2015-04-21 DIAGNOSIS — I10 Essential (primary) hypertension: Secondary | ICD-10-CM | POA: Diagnosis not present

## 2015-04-21 DIAGNOSIS — E780 Pure hypercholesterolemia: Secondary | ICD-10-CM | POA: Diagnosis not present

## 2015-04-21 DIAGNOSIS — Z125 Encounter for screening for malignant neoplasm of prostate: Secondary | ICD-10-CM | POA: Diagnosis not present

## 2015-04-22 ENCOUNTER — Encounter: Payer: Self-pay | Admitting: Vascular Surgery

## 2015-04-22 DIAGNOSIS — N186 End stage renal disease: Secondary | ICD-10-CM | POA: Diagnosis not present

## 2015-04-22 DIAGNOSIS — D689 Coagulation defect, unspecified: Secondary | ICD-10-CM | POA: Diagnosis not present

## 2015-04-22 DIAGNOSIS — D509 Iron deficiency anemia, unspecified: Secondary | ICD-10-CM | POA: Diagnosis not present

## 2015-04-22 DIAGNOSIS — E1129 Type 2 diabetes mellitus with other diabetic kidney complication: Secondary | ICD-10-CM | POA: Diagnosis not present

## 2015-04-22 DIAGNOSIS — Z23 Encounter for immunization: Secondary | ICD-10-CM | POA: Diagnosis not present

## 2015-04-23 ENCOUNTER — Encounter: Payer: Self-pay | Admitting: Vascular Surgery

## 2015-04-23 ENCOUNTER — Encounter (HOSPITAL_COMMUNITY): Payer: Self-pay

## 2015-04-24 DIAGNOSIS — E1129 Type 2 diabetes mellitus with other diabetic kidney complication: Secondary | ICD-10-CM | POA: Diagnosis not present

## 2015-04-24 DIAGNOSIS — Z23 Encounter for immunization: Secondary | ICD-10-CM | POA: Diagnosis not present

## 2015-04-24 DIAGNOSIS — D689 Coagulation defect, unspecified: Secondary | ICD-10-CM | POA: Diagnosis not present

## 2015-04-24 DIAGNOSIS — D509 Iron deficiency anemia, unspecified: Secondary | ICD-10-CM | POA: Diagnosis not present

## 2015-04-24 DIAGNOSIS — N186 End stage renal disease: Secondary | ICD-10-CM | POA: Diagnosis not present

## 2015-04-26 DIAGNOSIS — E1129 Type 2 diabetes mellitus with other diabetic kidney complication: Secondary | ICD-10-CM | POA: Diagnosis not present

## 2015-04-26 DIAGNOSIS — D689 Coagulation defect, unspecified: Secondary | ICD-10-CM | POA: Diagnosis not present

## 2015-04-26 DIAGNOSIS — N186 End stage renal disease: Secondary | ICD-10-CM | POA: Diagnosis not present

## 2015-04-26 DIAGNOSIS — D509 Iron deficiency anemia, unspecified: Secondary | ICD-10-CM | POA: Diagnosis not present

## 2015-04-26 DIAGNOSIS — Z23 Encounter for immunization: Secondary | ICD-10-CM | POA: Diagnosis not present

## 2015-04-29 DIAGNOSIS — E1129 Type 2 diabetes mellitus with other diabetic kidney complication: Secondary | ICD-10-CM | POA: Diagnosis not present

## 2015-04-29 DIAGNOSIS — N186 End stage renal disease: Secondary | ICD-10-CM | POA: Diagnosis not present

## 2015-04-29 DIAGNOSIS — D689 Coagulation defect, unspecified: Secondary | ICD-10-CM | POA: Diagnosis not present

## 2015-04-29 DIAGNOSIS — D509 Iron deficiency anemia, unspecified: Secondary | ICD-10-CM | POA: Diagnosis not present

## 2015-04-29 DIAGNOSIS — Z23 Encounter for immunization: Secondary | ICD-10-CM | POA: Diagnosis not present

## 2015-05-01 DIAGNOSIS — D689 Coagulation defect, unspecified: Secondary | ICD-10-CM | POA: Diagnosis not present

## 2015-05-01 DIAGNOSIS — N186 End stage renal disease: Secondary | ICD-10-CM | POA: Diagnosis not present

## 2015-05-01 DIAGNOSIS — E1129 Type 2 diabetes mellitus with other diabetic kidney complication: Secondary | ICD-10-CM | POA: Diagnosis not present

## 2015-05-01 DIAGNOSIS — Z23 Encounter for immunization: Secondary | ICD-10-CM | POA: Diagnosis not present

## 2015-05-01 DIAGNOSIS — D509 Iron deficiency anemia, unspecified: Secondary | ICD-10-CM | POA: Diagnosis not present

## 2015-05-02 ENCOUNTER — Encounter: Payer: Self-pay | Admitting: Vascular Surgery

## 2015-05-02 ENCOUNTER — Encounter (HOSPITAL_COMMUNITY): Payer: Self-pay

## 2015-05-03 DIAGNOSIS — Z23 Encounter for immunization: Secondary | ICD-10-CM | POA: Diagnosis not present

## 2015-05-03 DIAGNOSIS — D509 Iron deficiency anemia, unspecified: Secondary | ICD-10-CM | POA: Diagnosis not present

## 2015-05-03 DIAGNOSIS — D689 Coagulation defect, unspecified: Secondary | ICD-10-CM | POA: Diagnosis not present

## 2015-05-03 DIAGNOSIS — N186 End stage renal disease: Secondary | ICD-10-CM | POA: Diagnosis not present

## 2015-05-03 DIAGNOSIS — E1129 Type 2 diabetes mellitus with other diabetic kidney complication: Secondary | ICD-10-CM | POA: Diagnosis not present

## 2015-05-06 DIAGNOSIS — N186 End stage renal disease: Secondary | ICD-10-CM | POA: Diagnosis not present

## 2015-05-06 DIAGNOSIS — E1129 Type 2 diabetes mellitus with other diabetic kidney complication: Secondary | ICD-10-CM | POA: Diagnosis not present

## 2015-05-06 DIAGNOSIS — D509 Iron deficiency anemia, unspecified: Secondary | ICD-10-CM | POA: Diagnosis not present

## 2015-05-06 DIAGNOSIS — D689 Coagulation defect, unspecified: Secondary | ICD-10-CM | POA: Diagnosis not present

## 2015-05-06 DIAGNOSIS — Z23 Encounter for immunization: Secondary | ICD-10-CM | POA: Diagnosis not present

## 2015-05-08 DIAGNOSIS — N186 End stage renal disease: Secondary | ICD-10-CM | POA: Diagnosis not present

## 2015-05-08 DIAGNOSIS — D689 Coagulation defect, unspecified: Secondary | ICD-10-CM | POA: Diagnosis not present

## 2015-05-08 DIAGNOSIS — E1129 Type 2 diabetes mellitus with other diabetic kidney complication: Secondary | ICD-10-CM | POA: Diagnosis not present

## 2015-05-08 DIAGNOSIS — Z23 Encounter for immunization: Secondary | ICD-10-CM | POA: Diagnosis not present

## 2015-05-08 DIAGNOSIS — D509 Iron deficiency anemia, unspecified: Secondary | ICD-10-CM | POA: Diagnosis not present

## 2015-05-09 ENCOUNTER — Encounter: Payer: Self-pay | Admitting: Surgery

## 2015-05-10 DIAGNOSIS — Z23 Encounter for immunization: Secondary | ICD-10-CM | POA: Diagnosis not present

## 2015-05-10 DIAGNOSIS — N186 End stage renal disease: Secondary | ICD-10-CM | POA: Diagnosis not present

## 2015-05-10 DIAGNOSIS — E1129 Type 2 diabetes mellitus with other diabetic kidney complication: Secondary | ICD-10-CM | POA: Diagnosis not present

## 2015-05-10 DIAGNOSIS — D689 Coagulation defect, unspecified: Secondary | ICD-10-CM | POA: Diagnosis not present

## 2015-05-10 DIAGNOSIS — D509 Iron deficiency anemia, unspecified: Secondary | ICD-10-CM | POA: Diagnosis not present

## 2015-05-12 ENCOUNTER — Ambulatory Visit (HOSPITAL_COMMUNITY)
Admission: RE | Admit: 2015-05-12 | Discharge: 2015-05-12 | Disposition: A | Discharge status: Home / Self Care | Payer: Medicare Other | Source: Ambulatory Visit | Attending: Vascular Surgery | Admitting: Vascular Surgery

## 2015-05-12 ENCOUNTER — Other Ambulatory Visit: Payer: Self-pay | Admitting: Vascular Surgery

## 2015-05-12 ENCOUNTER — Encounter: Payer: Self-pay | Admitting: Surgery

## 2015-05-12 ENCOUNTER — Ambulatory Visit (INDEPENDENT_AMBULATORY_CARE_PROVIDER_SITE_OTHER): Payer: Medicare Other | Admitting: Surgery

## 2015-05-12 VITALS — BP 174/75 | HR 89 | Temp 97.4°F | Ht 68.5 in | Wt 217.0 lb

## 2015-05-12 DIAGNOSIS — I10 Essential (primary) hypertension: Secondary | ICD-10-CM | POA: Diagnosis not present

## 2015-05-12 DIAGNOSIS — E785 Hyperlipidemia, unspecified: Secondary | ICD-10-CM | POA: Diagnosis not present

## 2015-05-12 DIAGNOSIS — R202 Paresthesia of skin: Secondary | ICD-10-CM

## 2015-05-12 DIAGNOSIS — L98499 Non-pressure chronic ulcer of skin of other sites with unspecified severity: Secondary | ICD-10-CM | POA: Insufficient documentation

## 2015-05-12 DIAGNOSIS — R200 Anesthesia of skin: Secondary | ICD-10-CM

## 2015-05-12 DIAGNOSIS — L97511 Non-pressure chronic ulcer of other part of right foot limited to breakdown of skin: Secondary | ICD-10-CM | POA: Diagnosis not present

## 2015-05-12 DIAGNOSIS — Z87891 Personal history of nicotine dependence: Secondary | ICD-10-CM | POA: Diagnosis not present

## 2015-05-12 DIAGNOSIS — E119 Type 2 diabetes mellitus without complications: Secondary | ICD-10-CM | POA: Insufficient documentation

## 2015-05-12 NOTE — Progress Notes (Signed)
Patient name: Blake [name] MRN: [id number] DOB: 1945-09-20 Sex: male   Referred by: Renal  Reason for referral:  Chief Complaint  Patient presents with  . New Evaluation    eval Rt great toe wound    HISTORY OF PRESENT ILLNESS: This is a 69 year old gentleman with end-stage renal disease.  He is on dialysis Tuesday Thursday Saturday via a right brachiocephalic fistula.  He does complain of occasional numbness in his hand and has had some writing difficulties but this is a chronic issue for him.  He was referred today for evaluation of a right great toe ulcer which he states has been present for approximately 6 months and developed when his toenail fell off.  He does not endorse any pain in this area.  He denies claudication symptoms in his legs.  He states that the wound initially bled a lot.  He has been on anti-biotics.  There is no drainage or foul odor.  The patient suffers from hypercholesterolemia which is managed with a statin.  He is a former smoker.  He has seen a podiatrist in the past.  Past Medical History  Diagnosis Date  . Diabetes mellitus without complication   . Hypertension   . Hyperlipidemia   . Renal disorder     Past Surgical History  Procedure Laterality Date  . Back surgery    . Insertion of dialysis catheter Left 07/05/2014    Procedure: INSERTION OF DIALYSIS CATHETER-LEFT INTERNAL JUGULAR PLACEMENT;  Surgeon: Conrad [place]ville, MD;  Location: [place];  Service: Vascular;  Laterality: Left;  . Av fistula placement Right 07/05/2014    Procedure: ARTERIOVENOUS BRACHIALCEPHALIC (AV) FISTULA CREATION;  Surgeon: Conrad Woodbridge, MD;  Location: [place];  Service: Vascular;  Laterality: Right;  . Removal of a dialysis catheter Right 07/05/2014    Procedure: REMOVAL OF A DIALYSIS CATHETER;  Surgeon: Conrad Blackgum, MD;  Location: [place];  Service: Vascular;  Laterality: Right;    Social History   Social History  . Marital Status: Single    Spouse Name: N/A  .  Number of Children: N/A  . Years of Education: N/A   Occupational History  . Not on file.   Social History Main Topics  . Smoking status: Former Smoker -- 5 years    Types: Cigarettes    Quit date: 08/24/1979  . Smokeless tobacco: Never Used  . Alcohol Use: No  . Drug Use: No  . Sexual Activity: Not on file   Other Topics Concern  . Not on file   Social History Narrative    Family History  Problem Relation Age of Onset  . Diabetes Father   . Heart attack Father     Allergies as of 05/12/2015  . (No Known Allergies)    Current Outpatient Prescriptions on File Prior to Visit  Medication Sig Dispense Refill  . allopurinol (ZYLOPRIM) 100 MG tablet     . amLODipine (NORVASC) 10 MG tablet     . aspirin EC 81 MG tablet Take 81 mg by mouth daily.    Marland Kitchen atorvastatin (LIPITOR) 20 MG tablet Take 20 mg by mouth daily.    Marland Kitchen buPROPion (WELLBUTRIN XL) 150 MG 24 hr tablet Take 300 mg by mouth daily.    . calcitRIOL (ROCALTROL) 0.25 MCG capsule Take 0.25 mcg by mouth daily.    . calcium acetate (PHOSLO) 667 MG capsule     . calcium acetate (PHOSLO) 667 MG tablet Take 1 tablet by  mouth 3 (three) times daily with meals. 90 tablet 11  . DULoxetine (CYMBALTA) 20 MG capsule Take 1 capsule (20 mg total) by mouth daily.  3  . furosemide (LASIX) 40 MG tablet     . insulin aspart (NOVOLOG) 100 UNIT/ML injection Inject 4 Units into the skin 3 (three) times daily with meals. 10 mL 11  . insulin aspart (NOVOLOG) 100 UNIT/ML injection Inject 0-15 Units into the skin 3 (three) times daily with meals. 10 mL 11  . insulin glargine (LANTUS) 100 UNIT/ML injection Inject 0.25 mLs (25 Units total) into the skin at bedtime. 10 mL 11  . NOVOLIN N RELION 100 UNIT/ML injection     . NOVOLIN R RELION 100 UNIT/ML injection     . Nutritional Supplements (FEEDING SUPPLEMENT, NEPRO CARB STEADY,) LIQD Take 237 mLs by mouth 2 (two) times daily between meals.  0  . pantoprazole (PROTONIX) 40 MG tablet Take 1 tablet  (40 mg total) by mouth daily.    . sevelamer carbonate (RENVELA) 800 MG tablet Take 1 tablet (800 mg total) by mouth 3 (three) times daily with meals. 90 tablet 11   No current facility-administered medications on file prior to visit.     REVIEW OF SYSTEMS: See history of present illness for pertinent positives and negatives.  Otherwise all systems are negative  PHYSICAL EXAMINATION:  Filed Vitals:   05/12/15 0924 05/12/15 0930  BP: 178/82 174/75  Pulse: 89   Temp: 97.4 F (36.3 C)   TempSrc: Oral   Height: 5' 8.5" (1.74 m)   Weight: 217 lb (98.431 kg)   SpO2: 95%    Body mass index is 32.51 kg/(m^2). General: The patient appears their stated age.   HEENT:  No gross abnormalities Pulmonary: Respirations are non-labored Abdomen: Soft and non-tender  Musculoskeletal: There are no major deformities.   Neurologic: No focal weakness or paresthesias are detected, Skin: Patient has what looks like outcome I close this of the right great nail bed.  There is a callus/eschar on the bottom of the right great toe. Psychiatric: The patient has normal affect. Cardiovascular: There is a regular rate and rhythm without significant murmur appreciated.  No carotid bruits.  Palpable pedal pulses  Diagnostic Studies: ABIs on the right are 1.01 and 1.26 on the left.  All waveforms are triphasic.  Toe pressure on the right is 151 and 153 on the left    Assessment:  Right great toe callus Plan: The patient's blood flow to his right foot is normal.  His ABIs are normal with triphasic waveforms and his toe pressure is in the normal range.  To me, this represents more of a callus with onychomycosis of the nailbed.  Since there are no underlying vascular issues, this would probably best be managed by podiatry.  He has seen a podiatrist in the past but is unsure of name.  I will try to get him back in touch with them within the next week or 2 for further evaluation and treatment of the toe.     Eldridge Abrahams, M.D. [facility] [place] Office: [phone] Pager:  [phone]

## 2015-05-13 DIAGNOSIS — N186 End stage renal disease: Secondary | ICD-10-CM | POA: Diagnosis not present

## 2015-05-13 DIAGNOSIS — E1129 Type 2 diabetes mellitus with other diabetic kidney complication: Secondary | ICD-10-CM | POA: Diagnosis not present

## 2015-05-13 DIAGNOSIS — D689 Coagulation defect, unspecified: Secondary | ICD-10-CM | POA: Diagnosis not present

## 2015-05-13 DIAGNOSIS — Z23 Encounter for immunization: Secondary | ICD-10-CM | POA: Diagnosis not present

## 2015-05-13 DIAGNOSIS — D509 Iron deficiency anemia, unspecified: Secondary | ICD-10-CM | POA: Diagnosis not present

## 2015-05-14 DIAGNOSIS — Z992 Dependence on renal dialysis: Secondary | ICD-10-CM | POA: Diagnosis not present

## 2015-05-14 DIAGNOSIS — E1122 Type 2 diabetes mellitus with diabetic chronic kidney disease: Secondary | ICD-10-CM | POA: Diagnosis not present

## 2015-05-14 DIAGNOSIS — N186 End stage renal disease: Secondary | ICD-10-CM | POA: Diagnosis not present

## 2015-05-15 DIAGNOSIS — N186 End stage renal disease: Secondary | ICD-10-CM | POA: Diagnosis not present

## 2015-05-15 DIAGNOSIS — D689 Coagulation defect, unspecified: Secondary | ICD-10-CM | POA: Diagnosis not present

## 2015-05-15 DIAGNOSIS — E1129 Type 2 diabetes mellitus with other diabetic kidney complication: Secondary | ICD-10-CM | POA: Diagnosis not present

## 2015-05-15 DIAGNOSIS — D509 Iron deficiency anemia, unspecified: Secondary | ICD-10-CM | POA: Diagnosis not present

## 2015-05-16 DIAGNOSIS — L97519 Non-pressure chronic ulcer of other part of right foot with unspecified severity: Secondary | ICD-10-CM | POA: Diagnosis not present

## 2015-05-17 DIAGNOSIS — D509 Iron deficiency anemia, unspecified: Secondary | ICD-10-CM | POA: Diagnosis not present

## 2015-05-17 DIAGNOSIS — D689 Coagulation defect, unspecified: Secondary | ICD-10-CM | POA: Diagnosis not present

## 2015-05-17 DIAGNOSIS — N186 End stage renal disease: Secondary | ICD-10-CM | POA: Diagnosis not present

## 2015-05-17 DIAGNOSIS — E1129 Type 2 diabetes mellitus with other diabetic kidney complication: Secondary | ICD-10-CM | POA: Diagnosis not present

## 2015-05-21 DIAGNOSIS — E1165 Type 2 diabetes mellitus with hyperglycemia: Secondary | ICD-10-CM | POA: Diagnosis not present

## 2015-05-21 DIAGNOSIS — Z23 Encounter for immunization: Secondary | ICD-10-CM | POA: Diagnosis not present

## 2015-05-21 DIAGNOSIS — Z0000 Encounter for general adult medical examination without abnormal findings: Secondary | ICD-10-CM | POA: Diagnosis not present

## 2015-05-21 DIAGNOSIS — R531 Weakness: Secondary | ICD-10-CM | POA: Diagnosis not present

## 2015-05-21 DIAGNOSIS — E780 Pure hypercholesterolemia: Secondary | ICD-10-CM | POA: Diagnosis not present

## 2015-05-21 DIAGNOSIS — I10 Essential (primary) hypertension: Secondary | ICD-10-CM | POA: Diagnosis not present

## 2015-05-22 DIAGNOSIS — E1129 Type 2 diabetes mellitus with other diabetic kidney complication: Secondary | ICD-10-CM | POA: Diagnosis not present

## 2015-05-22 DIAGNOSIS — D509 Iron deficiency anemia, unspecified: Secondary | ICD-10-CM | POA: Diagnosis not present

## 2015-05-22 DIAGNOSIS — N186 End stage renal disease: Secondary | ICD-10-CM | POA: Diagnosis not present

## 2015-05-22 DIAGNOSIS — D689 Coagulation defect, unspecified: Secondary | ICD-10-CM | POA: Diagnosis not present

## 2015-05-23 DIAGNOSIS — L97519 Non-pressure chronic ulcer of other part of right foot with unspecified severity: Secondary | ICD-10-CM | POA: Diagnosis not present

## 2015-05-24 DIAGNOSIS — D509 Iron deficiency anemia, unspecified: Secondary | ICD-10-CM | POA: Diagnosis not present

## 2015-05-24 DIAGNOSIS — E1129 Type 2 diabetes mellitus with other diabetic kidney complication: Secondary | ICD-10-CM | POA: Diagnosis not present

## 2015-05-24 DIAGNOSIS — D689 Coagulation defect, unspecified: Secondary | ICD-10-CM | POA: Diagnosis not present

## 2015-05-24 DIAGNOSIS — N186 End stage renal disease: Secondary | ICD-10-CM | POA: Diagnosis not present

## 2015-05-27 DIAGNOSIS — E1129 Type 2 diabetes mellitus with other diabetic kidney complication: Secondary | ICD-10-CM | POA: Diagnosis not present

## 2015-05-27 DIAGNOSIS — N186 End stage renal disease: Secondary | ICD-10-CM | POA: Diagnosis not present

## 2015-05-27 DIAGNOSIS — D509 Iron deficiency anemia, unspecified: Secondary | ICD-10-CM | POA: Diagnosis not present

## 2015-05-27 DIAGNOSIS — D689 Coagulation defect, unspecified: Secondary | ICD-10-CM | POA: Diagnosis not present

## 2015-05-29 DIAGNOSIS — D689 Coagulation defect, unspecified: Secondary | ICD-10-CM | POA: Diagnosis not present

## 2015-05-29 DIAGNOSIS — E1129 Type 2 diabetes mellitus with other diabetic kidney complication: Secondary | ICD-10-CM | POA: Diagnosis not present

## 2015-05-29 DIAGNOSIS — N186 End stage renal disease: Secondary | ICD-10-CM | POA: Diagnosis not present

## 2015-05-29 DIAGNOSIS — D509 Iron deficiency anemia, unspecified: Secondary | ICD-10-CM | POA: Diagnosis not present

## 2015-05-31 DIAGNOSIS — D689 Coagulation defect, unspecified: Secondary | ICD-10-CM | POA: Diagnosis not present

## 2015-05-31 DIAGNOSIS — N186 End stage renal disease: Secondary | ICD-10-CM | POA: Diagnosis not present

## 2015-05-31 DIAGNOSIS — E1129 Type 2 diabetes mellitus with other diabetic kidney complication: Secondary | ICD-10-CM | POA: Diagnosis not present

## 2015-05-31 DIAGNOSIS — D509 Iron deficiency anemia, unspecified: Secondary | ICD-10-CM | POA: Diagnosis not present

## 2015-06-03 DIAGNOSIS — D509 Iron deficiency anemia, unspecified: Secondary | ICD-10-CM | POA: Diagnosis not present

## 2015-06-03 DIAGNOSIS — E1129 Type 2 diabetes mellitus with other diabetic kidney complication: Secondary | ICD-10-CM | POA: Diagnosis not present

## 2015-06-03 DIAGNOSIS — D689 Coagulation defect, unspecified: Secondary | ICD-10-CM | POA: Diagnosis not present

## 2015-06-03 DIAGNOSIS — N186 End stage renal disease: Secondary | ICD-10-CM | POA: Diagnosis not present

## 2015-06-05 DIAGNOSIS — N186 End stage renal disease: Secondary | ICD-10-CM | POA: Diagnosis not present

## 2015-06-05 DIAGNOSIS — E1129 Type 2 diabetes mellitus with other diabetic kidney complication: Secondary | ICD-10-CM | POA: Diagnosis not present

## 2015-06-05 DIAGNOSIS — D689 Coagulation defect, unspecified: Secondary | ICD-10-CM | POA: Diagnosis not present

## 2015-06-05 DIAGNOSIS — D509 Iron deficiency anemia, unspecified: Secondary | ICD-10-CM | POA: Diagnosis not present

## 2015-06-07 DIAGNOSIS — D509 Iron deficiency anemia, unspecified: Secondary | ICD-10-CM | POA: Diagnosis not present

## 2015-06-07 DIAGNOSIS — D689 Coagulation defect, unspecified: Secondary | ICD-10-CM | POA: Diagnosis not present

## 2015-06-07 DIAGNOSIS — E1129 Type 2 diabetes mellitus with other diabetic kidney complication: Secondary | ICD-10-CM | POA: Diagnosis not present

## 2015-06-07 DIAGNOSIS — N186 End stage renal disease: Secondary | ICD-10-CM | POA: Diagnosis not present

## 2015-06-10 DIAGNOSIS — E1129 Type 2 diabetes mellitus with other diabetic kidney complication: Secondary | ICD-10-CM | POA: Diagnosis not present

## 2015-06-10 DIAGNOSIS — D509 Iron deficiency anemia, unspecified: Secondary | ICD-10-CM | POA: Diagnosis not present

## 2015-06-10 DIAGNOSIS — D689 Coagulation defect, unspecified: Secondary | ICD-10-CM | POA: Diagnosis not present

## 2015-06-10 DIAGNOSIS — N186 End stage renal disease: Secondary | ICD-10-CM | POA: Diagnosis not present

## 2015-06-11 DIAGNOSIS — E1042 Type 1 diabetes mellitus with diabetic polyneuropathy: Secondary | ICD-10-CM | POA: Diagnosis not present

## 2015-06-11 DIAGNOSIS — N186 End stage renal disease: Secondary | ICD-10-CM | POA: Diagnosis not present

## 2015-06-11 DIAGNOSIS — E1165 Type 2 diabetes mellitus with hyperglycemia: Secondary | ICD-10-CM | POA: Diagnosis not present

## 2015-06-11 DIAGNOSIS — I10 Essential (primary) hypertension: Secondary | ICD-10-CM | POA: Diagnosis not present

## 2015-06-12 DIAGNOSIS — D509 Iron deficiency anemia, unspecified: Secondary | ICD-10-CM | POA: Diagnosis not present

## 2015-06-12 DIAGNOSIS — D689 Coagulation defect, unspecified: Secondary | ICD-10-CM | POA: Diagnosis not present

## 2015-06-12 DIAGNOSIS — N186 End stage renal disease: Secondary | ICD-10-CM | POA: Diagnosis not present

## 2015-06-12 DIAGNOSIS — E1129 Type 2 diabetes mellitus with other diabetic kidney complication: Secondary | ICD-10-CM | POA: Diagnosis not present

## 2015-06-13 DIAGNOSIS — N186 End stage renal disease: Secondary | ICD-10-CM | POA: Diagnosis not present

## 2015-06-13 DIAGNOSIS — Z992 Dependence on renal dialysis: Secondary | ICD-10-CM | POA: Diagnosis not present

## 2015-06-13 DIAGNOSIS — E1122 Type 2 diabetes mellitus with diabetic chronic kidney disease: Secondary | ICD-10-CM | POA: Diagnosis not present

## 2015-06-14 DIAGNOSIS — N186 End stage renal disease: Secondary | ICD-10-CM | POA: Diagnosis not present

## 2015-06-14 DIAGNOSIS — E1129 Type 2 diabetes mellitus with other diabetic kidney complication: Secondary | ICD-10-CM | POA: Diagnosis not present

## 2015-06-14 DIAGNOSIS — D689 Coagulation defect, unspecified: Secondary | ICD-10-CM | POA: Diagnosis not present

## 2015-06-14 DIAGNOSIS — D509 Iron deficiency anemia, unspecified: Secondary | ICD-10-CM | POA: Diagnosis not present

## 2015-06-17 DIAGNOSIS — D509 Iron deficiency anemia, unspecified: Secondary | ICD-10-CM | POA: Diagnosis not present

## 2015-06-17 DIAGNOSIS — N186 End stage renal disease: Secondary | ICD-10-CM | POA: Diagnosis not present

## 2015-06-17 DIAGNOSIS — E1129 Type 2 diabetes mellitus with other diabetic kidney complication: Secondary | ICD-10-CM | POA: Diagnosis not present

## 2015-06-17 DIAGNOSIS — D689 Coagulation defect, unspecified: Secondary | ICD-10-CM | POA: Diagnosis not present

## 2015-06-19 DIAGNOSIS — D689 Coagulation defect, unspecified: Secondary | ICD-10-CM | POA: Diagnosis not present

## 2015-06-19 DIAGNOSIS — E1129 Type 2 diabetes mellitus with other diabetic kidney complication: Secondary | ICD-10-CM | POA: Diagnosis not present

## 2015-06-19 DIAGNOSIS — D509 Iron deficiency anemia, unspecified: Secondary | ICD-10-CM | POA: Diagnosis not present

## 2015-06-19 DIAGNOSIS — N186 End stage renal disease: Secondary | ICD-10-CM | POA: Diagnosis not present

## 2015-06-21 DIAGNOSIS — N186 End stage renal disease: Secondary | ICD-10-CM | POA: Diagnosis not present

## 2015-06-21 DIAGNOSIS — D509 Iron deficiency anemia, unspecified: Secondary | ICD-10-CM | POA: Diagnosis not present

## 2015-06-21 DIAGNOSIS — E1129 Type 2 diabetes mellitus with other diabetic kidney complication: Secondary | ICD-10-CM | POA: Diagnosis not present

## 2015-06-21 DIAGNOSIS — D689 Coagulation defect, unspecified: Secondary | ICD-10-CM | POA: Diagnosis not present

## 2015-06-24 DIAGNOSIS — E1129 Type 2 diabetes mellitus with other diabetic kidney complication: Secondary | ICD-10-CM | POA: Diagnosis not present

## 2015-06-24 DIAGNOSIS — N186 End stage renal disease: Secondary | ICD-10-CM | POA: Diagnosis not present

## 2015-06-24 DIAGNOSIS — D509 Iron deficiency anemia, unspecified: Secondary | ICD-10-CM | POA: Diagnosis not present

## 2015-06-24 DIAGNOSIS — D689 Coagulation defect, unspecified: Secondary | ICD-10-CM | POA: Diagnosis not present

## 2015-06-26 DIAGNOSIS — D689 Coagulation defect, unspecified: Secondary | ICD-10-CM | POA: Diagnosis not present

## 2015-06-26 DIAGNOSIS — E1129 Type 2 diabetes mellitus with other diabetic kidney complication: Secondary | ICD-10-CM | POA: Diagnosis not present

## 2015-06-26 DIAGNOSIS — D509 Iron deficiency anemia, unspecified: Secondary | ICD-10-CM | POA: Diagnosis not present

## 2015-06-26 DIAGNOSIS — N186 End stage renal disease: Secondary | ICD-10-CM | POA: Diagnosis not present

## 2015-06-28 DIAGNOSIS — D689 Coagulation defect, unspecified: Secondary | ICD-10-CM | POA: Diagnosis not present

## 2015-06-28 DIAGNOSIS — E1129 Type 2 diabetes mellitus with other diabetic kidney complication: Secondary | ICD-10-CM | POA: Diagnosis not present

## 2015-06-28 DIAGNOSIS — N186 End stage renal disease: Secondary | ICD-10-CM | POA: Diagnosis not present

## 2015-06-28 DIAGNOSIS — D509 Iron deficiency anemia, unspecified: Secondary | ICD-10-CM | POA: Diagnosis not present

## 2015-07-01 DIAGNOSIS — D509 Iron deficiency anemia, unspecified: Secondary | ICD-10-CM | POA: Diagnosis not present

## 2015-07-01 DIAGNOSIS — N186 End stage renal disease: Secondary | ICD-10-CM | POA: Diagnosis not present

## 2015-07-01 DIAGNOSIS — D689 Coagulation defect, unspecified: Secondary | ICD-10-CM | POA: Diagnosis not present

## 2015-07-01 DIAGNOSIS — E1129 Type 2 diabetes mellitus with other diabetic kidney complication: Secondary | ICD-10-CM | POA: Diagnosis not present

## 2015-07-03 DIAGNOSIS — E1129 Type 2 diabetes mellitus with other diabetic kidney complication: Secondary | ICD-10-CM | POA: Diagnosis not present

## 2015-07-03 DIAGNOSIS — D509 Iron deficiency anemia, unspecified: Secondary | ICD-10-CM | POA: Diagnosis not present

## 2015-07-03 DIAGNOSIS — D689 Coagulation defect, unspecified: Secondary | ICD-10-CM | POA: Diagnosis not present

## 2015-07-03 DIAGNOSIS — N186 End stage renal disease: Secondary | ICD-10-CM | POA: Diagnosis not present

## 2015-07-05 DIAGNOSIS — E1129 Type 2 diabetes mellitus with other diabetic kidney complication: Secondary | ICD-10-CM | POA: Diagnosis not present

## 2015-07-05 DIAGNOSIS — D509 Iron deficiency anemia, unspecified: Secondary | ICD-10-CM | POA: Diagnosis not present

## 2015-07-05 DIAGNOSIS — N186 End stage renal disease: Secondary | ICD-10-CM | POA: Diagnosis not present

## 2015-07-05 DIAGNOSIS — D689 Coagulation defect, unspecified: Secondary | ICD-10-CM | POA: Diagnosis not present

## 2015-07-08 DIAGNOSIS — D509 Iron deficiency anemia, unspecified: Secondary | ICD-10-CM | POA: Diagnosis not present

## 2015-07-08 DIAGNOSIS — N186 End stage renal disease: Secondary | ICD-10-CM | POA: Diagnosis not present

## 2015-07-08 DIAGNOSIS — E1129 Type 2 diabetes mellitus with other diabetic kidney complication: Secondary | ICD-10-CM | POA: Diagnosis not present

## 2015-07-08 DIAGNOSIS — D689 Coagulation defect, unspecified: Secondary | ICD-10-CM | POA: Diagnosis not present

## 2015-07-10 DIAGNOSIS — D689 Coagulation defect, unspecified: Secondary | ICD-10-CM | POA: Diagnosis not present

## 2015-07-10 DIAGNOSIS — N186 End stage renal disease: Secondary | ICD-10-CM | POA: Diagnosis not present

## 2015-07-10 DIAGNOSIS — D509 Iron deficiency anemia, unspecified: Secondary | ICD-10-CM | POA: Diagnosis not present

## 2015-07-10 DIAGNOSIS — E1129 Type 2 diabetes mellitus with other diabetic kidney complication: Secondary | ICD-10-CM | POA: Diagnosis not present

## 2015-07-12 DIAGNOSIS — D689 Coagulation defect, unspecified: Secondary | ICD-10-CM | POA: Diagnosis not present

## 2015-07-12 DIAGNOSIS — E1129 Type 2 diabetes mellitus with other diabetic kidney complication: Secondary | ICD-10-CM | POA: Diagnosis not present

## 2015-07-12 DIAGNOSIS — N186 End stage renal disease: Secondary | ICD-10-CM | POA: Diagnosis not present

## 2015-07-12 DIAGNOSIS — D509 Iron deficiency anemia, unspecified: Secondary | ICD-10-CM | POA: Diagnosis not present

## 2015-07-14 DIAGNOSIS — E1122 Type 2 diabetes mellitus with diabetic chronic kidney disease: Secondary | ICD-10-CM | POA: Diagnosis not present

## 2015-07-14 DIAGNOSIS — Z992 Dependence on renal dialysis: Secondary | ICD-10-CM | POA: Diagnosis not present

## 2015-07-14 DIAGNOSIS — N186 End stage renal disease: Secondary | ICD-10-CM | POA: Diagnosis not present

## 2015-07-15 DIAGNOSIS — D509 Iron deficiency anemia, unspecified: Secondary | ICD-10-CM | POA: Diagnosis not present

## 2015-07-15 DIAGNOSIS — N186 End stage renal disease: Secondary | ICD-10-CM | POA: Diagnosis not present

## 2015-07-15 DIAGNOSIS — Z23 Encounter for immunization: Secondary | ICD-10-CM | POA: Diagnosis not present

## 2015-07-15 DIAGNOSIS — E1129 Type 2 diabetes mellitus with other diabetic kidney complication: Secondary | ICD-10-CM | POA: Diagnosis not present

## 2015-07-15 DIAGNOSIS — D689 Coagulation defect, unspecified: Secondary | ICD-10-CM | POA: Diagnosis not present

## 2015-07-17 DIAGNOSIS — E1129 Type 2 diabetes mellitus with other diabetic kidney complication: Secondary | ICD-10-CM | POA: Diagnosis not present

## 2015-07-17 DIAGNOSIS — D689 Coagulation defect, unspecified: Secondary | ICD-10-CM | POA: Diagnosis not present

## 2015-07-17 DIAGNOSIS — D509 Iron deficiency anemia, unspecified: Secondary | ICD-10-CM | POA: Diagnosis not present

## 2015-07-17 DIAGNOSIS — N186 End stage renal disease: Secondary | ICD-10-CM | POA: Diagnosis not present

## 2015-07-17 DIAGNOSIS — Z23 Encounter for immunization: Secondary | ICD-10-CM | POA: Diagnosis not present

## 2015-07-19 DIAGNOSIS — Z23 Encounter for immunization: Secondary | ICD-10-CM | POA: Diagnosis not present

## 2015-07-19 DIAGNOSIS — N186 End stage renal disease: Secondary | ICD-10-CM | POA: Diagnosis not present

## 2015-07-19 DIAGNOSIS — D689 Coagulation defect, unspecified: Secondary | ICD-10-CM | POA: Diagnosis not present

## 2015-07-19 DIAGNOSIS — D509 Iron deficiency anemia, unspecified: Secondary | ICD-10-CM | POA: Diagnosis not present

## 2015-07-19 DIAGNOSIS — E1129 Type 2 diabetes mellitus with other diabetic kidney complication: Secondary | ICD-10-CM | POA: Diagnosis not present

## 2015-07-22 DIAGNOSIS — D689 Coagulation defect, unspecified: Secondary | ICD-10-CM | POA: Diagnosis not present

## 2015-07-22 DIAGNOSIS — E1129 Type 2 diabetes mellitus with other diabetic kidney complication: Secondary | ICD-10-CM | POA: Diagnosis not present

## 2015-07-22 DIAGNOSIS — Z23 Encounter for immunization: Secondary | ICD-10-CM | POA: Diagnosis not present

## 2015-07-22 DIAGNOSIS — D509 Iron deficiency anemia, unspecified: Secondary | ICD-10-CM | POA: Diagnosis not present

## 2015-07-22 DIAGNOSIS — N186 End stage renal disease: Secondary | ICD-10-CM | POA: Diagnosis not present

## 2015-07-24 DIAGNOSIS — D509 Iron deficiency anemia, unspecified: Secondary | ICD-10-CM | POA: Diagnosis not present

## 2015-07-24 DIAGNOSIS — D689 Coagulation defect, unspecified: Secondary | ICD-10-CM | POA: Diagnosis not present

## 2015-07-24 DIAGNOSIS — N186 End stage renal disease: Secondary | ICD-10-CM | POA: Diagnosis not present

## 2015-07-24 DIAGNOSIS — E1129 Type 2 diabetes mellitus with other diabetic kidney complication: Secondary | ICD-10-CM | POA: Diagnosis not present

## 2015-07-24 DIAGNOSIS — Z23 Encounter for immunization: Secondary | ICD-10-CM | POA: Diagnosis not present

## 2015-07-26 DIAGNOSIS — N186 End stage renal disease: Secondary | ICD-10-CM | POA: Diagnosis not present

## 2015-07-26 DIAGNOSIS — D689 Coagulation defect, unspecified: Secondary | ICD-10-CM | POA: Diagnosis not present

## 2015-07-26 DIAGNOSIS — Z23 Encounter for immunization: Secondary | ICD-10-CM | POA: Diagnosis not present

## 2015-07-26 DIAGNOSIS — D509 Iron deficiency anemia, unspecified: Secondary | ICD-10-CM | POA: Diagnosis not present

## 2015-07-26 DIAGNOSIS — E1129 Type 2 diabetes mellitus with other diabetic kidney complication: Secondary | ICD-10-CM | POA: Diagnosis not present

## 2015-07-29 DIAGNOSIS — Z23 Encounter for immunization: Secondary | ICD-10-CM | POA: Diagnosis not present

## 2015-07-29 DIAGNOSIS — D509 Iron deficiency anemia, unspecified: Secondary | ICD-10-CM | POA: Diagnosis not present

## 2015-07-29 DIAGNOSIS — E1129 Type 2 diabetes mellitus with other diabetic kidney complication: Secondary | ICD-10-CM | POA: Diagnosis not present

## 2015-07-29 DIAGNOSIS — N186 End stage renal disease: Secondary | ICD-10-CM | POA: Diagnosis not present

## 2015-07-29 DIAGNOSIS — D689 Coagulation defect, unspecified: Secondary | ICD-10-CM | POA: Diagnosis not present

## 2015-08-01 ENCOUNTER — Observation Stay (HOSPITAL_COMMUNITY): Payer: Medicare Other

## 2015-08-01 ENCOUNTER — Encounter (HOSPITAL_COMMUNITY): Payer: Self-pay

## 2015-08-01 ENCOUNTER — Inpatient Hospital Stay (HOSPITAL_COMMUNITY)
Admission: EM | Admit: 2015-08-01 | Discharge: 2015-08-06 | DRG: 616 | Disposition: A | Discharge status: Home / Self Care | Payer: Medicare Other | Attending: Internal Medicine | Admitting: Internal Medicine

## 2015-08-01 ENCOUNTER — Emergency Department (HOSPITAL_COMMUNITY): Payer: Medicare Other

## 2015-08-01 DIAGNOSIS — G9341 Metabolic encephalopathy: Secondary | ICD-10-CM | POA: Diagnosis not present

## 2015-08-01 DIAGNOSIS — Z9115 Patient's noncompliance with renal dialysis: Secondary | ICD-10-CM | POA: Diagnosis not present

## 2015-08-01 DIAGNOSIS — E785 Hyperlipidemia, unspecified: Secondary | ICD-10-CM | POA: Diagnosis not present

## 2015-08-01 DIAGNOSIS — E162 Hypoglycemia, unspecified: Secondary | ICD-10-CM | POA: Diagnosis not present

## 2015-08-01 DIAGNOSIS — R4182 Altered mental status, unspecified: Secondary | ICD-10-CM | POA: Diagnosis not present

## 2015-08-01 DIAGNOSIS — R0902 Hypoxemia: Secondary | ICD-10-CM | POA: Diagnosis present

## 2015-08-01 DIAGNOSIS — L97519 Non-pressure chronic ulcer of other part of right foot with unspecified severity: Secondary | ICD-10-CM | POA: Diagnosis present

## 2015-08-01 DIAGNOSIS — E1122 Type 2 diabetes mellitus with diabetic chronic kidney disease: Secondary | ICD-10-CM | POA: Diagnosis not present

## 2015-08-01 DIAGNOSIS — E1140 Type 2 diabetes mellitus with diabetic neuropathy, unspecified: Secondary | ICD-10-CM | POA: Diagnosis present

## 2015-08-01 DIAGNOSIS — Z7982 Long term (current) use of aspirin: Secondary | ICD-10-CM

## 2015-08-01 DIAGNOSIS — K219 Gastro-esophageal reflux disease without esophagitis: Secondary | ICD-10-CM | POA: Diagnosis not present

## 2015-08-01 DIAGNOSIS — R4781 Slurred speech: Secondary | ICD-10-CM | POA: Diagnosis not present

## 2015-08-01 DIAGNOSIS — T68XXXA Hypothermia, initial encounter: Secondary | ICD-10-CM | POA: Diagnosis present

## 2015-08-01 DIAGNOSIS — Z87891 Personal history of nicotine dependence: Secondary | ICD-10-CM

## 2015-08-01 DIAGNOSIS — E1169 Type 2 diabetes mellitus with other specified complication: Secondary | ICD-10-CM | POA: Diagnosis not present

## 2015-08-01 DIAGNOSIS — I10 Essential (primary) hypertension: Secondary | ICD-10-CM | POA: Diagnosis not present

## 2015-08-01 DIAGNOSIS — E11621 Type 2 diabetes mellitus with foot ulcer: Secondary | ICD-10-CM | POA: Diagnosis not present

## 2015-08-01 DIAGNOSIS — I120 Hypertensive chronic kidney disease with stage 5 chronic kidney disease or end stage renal disease: Secondary | ICD-10-CM | POA: Diagnosis present

## 2015-08-01 DIAGNOSIS — Z992 Dependence on renal dialysis: Secondary | ICD-10-CM

## 2015-08-01 DIAGNOSIS — W19XXXD Unspecified fall, subsequent encounter: Secondary | ICD-10-CM | POA: Diagnosis not present

## 2015-08-01 DIAGNOSIS — J811 Chronic pulmonary edema: Secondary | ICD-10-CM | POA: Diagnosis not present

## 2015-08-01 DIAGNOSIS — M86171 Other acute osteomyelitis, right ankle and foot: Secondary | ICD-10-CM | POA: Diagnosis present

## 2015-08-01 DIAGNOSIS — N186 End stage renal disease: Secondary | ICD-10-CM | POA: Diagnosis present

## 2015-08-01 DIAGNOSIS — R7309 Other abnormal glucose: Secondary | ICD-10-CM | POA: Diagnosis not present

## 2015-08-01 DIAGNOSIS — R1110 Vomiting, unspecified: Secondary | ICD-10-CM | POA: Diagnosis present

## 2015-08-01 DIAGNOSIS — M898X9 Other specified disorders of bone, unspecified site: Secondary | ICD-10-CM | POA: Diagnosis present

## 2015-08-01 DIAGNOSIS — E11649 Type 2 diabetes mellitus with hypoglycemia without coma: Principal | ICD-10-CM | POA: Diagnosis present

## 2015-08-01 DIAGNOSIS — W19XXXA Unspecified fall, initial encounter: Secondary | ICD-10-CM | POA: Diagnosis not present

## 2015-08-01 DIAGNOSIS — L97509 Non-pressure chronic ulcer of other part of unspecified foot with unspecified severity: Secondary | ICD-10-CM

## 2015-08-01 DIAGNOSIS — Z794 Long term (current) use of insulin: Secondary | ICD-10-CM

## 2015-08-01 DIAGNOSIS — E8770 Fluid overload, unspecified: Secondary | ICD-10-CM | POA: Diagnosis present

## 2015-08-01 DIAGNOSIS — N2581 Secondary hyperparathyroidism of renal origin: Secondary | ICD-10-CM | POA: Diagnosis not present

## 2015-08-01 DIAGNOSIS — M869 Osteomyelitis, unspecified: Secondary | ICD-10-CM | POA: Diagnosis present

## 2015-08-01 DIAGNOSIS — L97511 Non-pressure chronic ulcer of other part of right foot limited to breakdown of skin: Secondary | ICD-10-CM

## 2015-08-01 DIAGNOSIS — E161 Other hypoglycemia: Secondary | ICD-10-CM | POA: Diagnosis not present

## 2015-08-01 DIAGNOSIS — T68XXXD Hypothermia, subsequent encounter: Secondary | ICD-10-CM | POA: Diagnosis not present

## 2015-08-01 DIAGNOSIS — Y92009 Unspecified place in unspecified non-institutional (private) residence as the place of occurrence of the external cause: Secondary | ICD-10-CM

## 2015-08-01 DIAGNOSIS — M868X6 Other osteomyelitis, lower leg: Secondary | ICD-10-CM | POA: Diagnosis not present

## 2015-08-01 HISTORY — DX: Other acute osteomyelitis, right ankle and foot: M86.171

## 2015-08-01 LAB — RAPID URINE DRUG SCREEN, HOSP PERFORMED
Amphetamines: NOT DETECTED
BARBITURATES: NOT DETECTED
BENZODIAZEPINES: NOT DETECTED
Cocaine: NOT DETECTED
Opiates: NOT DETECTED
Tetrahydrocannabinol: NOT DETECTED

## 2015-08-01 LAB — COMPREHENSIVE METABOLIC PANEL
ALBUMIN: 3.2 g/dL — AB (ref 3.5–5.0)
ALT: 21 U/L (ref 17–63)
AST: 29 U/L (ref 15–41)
Alkaline Phosphatase: 84 U/L (ref 38–126)
Anion gap: 10 (ref 5–15)
BUN: 54 mg/dL — AB (ref 6–20)
CHLORIDE: 103 mmol/L (ref 101–111)
CO2: 28 mmol/L (ref 22–32)
Calcium: 7.4 mg/dL — ABNORMAL LOW (ref 8.9–10.3)
Creatinine, Ser: 8.99 mg/dL — ABNORMAL HIGH (ref 0.61–1.24)
GFR calc Af Amer: 6 mL/min — ABNORMAL LOW (ref >60)
GFR calc non Af Amer: 5 mL/min — ABNORMAL LOW (ref >60)
GLUCOSE: 65 mg/dL (ref 65–99)
POTASSIUM: 3.3 mmol/L — AB (ref 3.5–5.1)
SODIUM: 141 mmol/L (ref 135–145)
Total Bilirubin: 0.5 mg/dL (ref 0.3–1.2)
Total Protein: 6.4 g/dL — ABNORMAL LOW (ref 6.5–8.1)

## 2015-08-01 LAB — CBG MONITORING, ED
GLUCOSE-CAPILLARY: 112 mg/dL — AB (ref 65–99)
GLUCOSE-CAPILLARY: 251 mg/dL — AB (ref 65–99)
GLUCOSE-CAPILLARY: 58 mg/dL — AB (ref 65–99)
GLUCOSE-CAPILLARY: 82 mg/dL (ref 65–99)
GLUCOSE-CAPILLARY: 89 mg/dL (ref 65–99)
GLUCOSE-CAPILLARY: 88 mg/dL (ref 65–99)
Glucose-Capillary: 113 mg/dL — ABNORMAL HIGH (ref 65–99)
Glucose-Capillary: 144 mg/dL — ABNORMAL HIGH (ref 65–99)
Glucose-Capillary: 171 mg/dL — ABNORMAL HIGH (ref 65–99)
Glucose-Capillary: 200 mg/dL — ABNORMAL HIGH (ref 65–99)
Glucose-Capillary: 186 mg/dL — ABNORMAL HIGH (ref 65–99)
Glucose-Capillary: 228 mg/dL — ABNORMAL HIGH (ref 65–99)

## 2015-08-01 LAB — CBC WITH DIFFERENTIAL/PLATELET
BASOS ABS: 0 10*3/uL (ref 0.0–0.1)
BASOS PCT: 0 %
EOS ABS: 0.2 10*3/uL (ref 0.0–0.7)
Eosinophils Relative: 2 %
HEMATOCRIT: 35.3 % — AB (ref 39.0–52.0)
Hemoglobin: 10.9 g/dL — ABNORMAL LOW (ref 13.0–17.0)
Lymphocytes Relative: 22 %
Lymphs Abs: 1.7 10*3/uL (ref 0.7–4.0)
MCH: 28.5 pg (ref 26.0–34.0)
MCHC: 30.9 g/dL (ref 30.0–36.0)
MCV: 92.2 fL (ref 78.0–100.0)
MONO ABS: 0.5 10*3/uL (ref 0.1–1.0)
Monocytes Relative: 7 %
NEUTROS ABS: 5 10*3/uL (ref 1.7–7.7)
Neutrophils Relative %: 69 %
PLATELETS: 180 10*3/uL (ref 150–400)
RBC: 3.83 MIL/uL — ABNORMAL LOW (ref 4.22–5.81)
RDW: 13.2 % (ref 11.5–15.5)
WBC: 7.4 10*3/uL (ref 4.0–10.5)

## 2015-08-01 LAB — RENAL FUNCTION PANEL
Albumin: 2.9 g/dL — ABNORMAL LOW (ref 3.5–5.0)
Anion gap: 13 (ref 5–15)
BUN: 52 mg/dL — ABNORMAL HIGH (ref 6–20)
CO2: 26 mmol/L (ref 22–32)
Calcium: 7.2 mg/dL — ABNORMAL LOW (ref 8.9–10.3)
Chloride: 101 mmol/L (ref 101–111)
Creatinine, Ser: 8.72 mg/dL — ABNORMAL HIGH (ref 0.61–1.24)
GFR calc Af Amer: 6 mL/min — ABNORMAL LOW (ref >60)
GFR calc non Af Amer: 6 mL/min — ABNORMAL LOW (ref >60)
Glucose, Bld: 236 mg/dL — ABNORMAL HIGH (ref 65–99)
Phosphorus: 4.8 mg/dL — ABNORMAL HIGH (ref 2.5–4.6)
Potassium: 4.4 mmol/L (ref 3.5–5.1)
Sodium: 140 mmol/L (ref 135–145)

## 2015-08-01 LAB — URINE MICROSCOPIC-ADD ON

## 2015-08-01 LAB — URINALYSIS, ROUTINE W REFLEX MICROSCOPIC
BILIRUBIN URINE: NEGATIVE
Glucose, UA: 100 mg/dL — AB
KETONES UR: NEGATIVE mg/dL
Leukocytes, UA: NEGATIVE
Nitrite: NEGATIVE
PROTEIN: 100 mg/dL — AB
Specific Gravity, Urine: 1.013 (ref 1.005–1.030)
pH: 8 (ref 5.0–8.0)

## 2015-08-01 LAB — TSH: TSH: 0.642 u[IU]/mL (ref 0.350–4.500)

## 2015-08-01 LAB — CBC
HCT: 34.6 % — ABNORMAL LOW (ref 39.0–52.0)
Hemoglobin: 11.1 g/dL — ABNORMAL LOW (ref 13.0–17.0)
MCH: 29.2 pg (ref 26.0–34.0)
MCHC: 32.1 g/dL (ref 30.0–36.0)
MCV: 91.1 fL (ref 78.0–100.0)
Platelets: 170 10*3/uL (ref 150–400)
RBC: 3.8 MIL/uL — ABNORMAL LOW (ref 4.22–5.81)
RDW: 13.1 % (ref 11.5–15.5)
WBC: 6.8 10*3/uL (ref 4.0–10.5)

## 2015-08-01 LAB — MRSA PCR SCREENING: MRSA by PCR: NEGATIVE

## 2015-08-01 LAB — GLUCOSE, CAPILLARY: GLUCOSE-CAPILLARY: 199 mg/dL — AB (ref 65–99)

## 2015-08-01 LAB — SEDIMENTATION RATE: Sed Rate: 35 mm/hr — ABNORMAL HIGH (ref 0–16)

## 2015-08-01 LAB — ETHANOL

## 2015-08-01 LAB — AMMONIA: AMMONIA: 27 umol/L (ref 9–35)

## 2015-08-01 LAB — C-REACTIVE PROTEIN: CRP: 1.1 mg/dL — ABNORMAL HIGH (ref <1.0)

## 2015-08-01 MED ORDER — HEPARIN SODIUM (PORCINE) 5000 UNIT/ML IJ SOLN
5000.0000 [IU] | Freq: Three times a day (TID) | INTRAMUSCULAR | Status: DC
  Administered 2015-08-01 – 2015-08-06 (×13): 5000 [IU] via SUBCUTANEOUS
  Filled 2015-08-01 (×11): qty 1

## 2015-08-01 MED ORDER — HYDROCODONE-ACETAMINOPHEN 5-325 MG PO TABS: 1.0000 | ORAL_TABLET | ORAL | Status: DC | PRN

## 2015-08-01 MED ORDER — ALTEPLASE 2 MG IJ SOLR
2.0000 mg | Freq: Once | INTRAMUSCULAR | Status: DC | PRN
  Filled 2015-08-01: qty 2

## 2015-08-01 MED ORDER — SODIUM CHLORIDE 0.9 % IV SOLN: 100.0000 mL | INTRAVENOUS | Status: DC | PRN

## 2015-08-01 MED ORDER — PIPERACILLIN-TAZOBACTAM 3.375 G IVPB 30 MIN
3.3750 g | Freq: Once | INTRAVENOUS | Status: AC
  Administered 2015-08-01: 3.375 g via INTRAVENOUS
  Filled 2015-08-01: qty 50

## 2015-08-01 MED ORDER — DOXERCALCIFEROL 4 MCG/2ML IV SOLN: 4.0000 ug | INTRAVENOUS | Status: DC

## 2015-08-01 MED ORDER — DOXERCALCIFEROL 4 MCG/2ML IV SOLN
4.0000 ug | INTRAVENOUS | Status: DC
  Administered 2015-08-02 – 2015-08-05 (×2): 4 ug via INTRAVENOUS
  Filled 2015-08-01 (×2): qty 2

## 2015-08-01 MED ORDER — DEXTROSE 10 % IV SOLN
INTRAVENOUS | Status: DC
  Administered 2015-08-01: 06:00:00 via INTRAVENOUS

## 2015-08-01 MED ORDER — PENTAFLUOROPROP-TETRAFLUOROETH EX AERO
1.0000 "application " | INHALATION_SPRAY | CUTANEOUS | Status: DC | PRN
  Filled 2015-08-01: qty 30

## 2015-08-01 MED ORDER — SODIUM CHLORIDE 0.9 % IV SOLN
2000.0000 mg | Freq: Once | INTRAVENOUS | Status: AC
  Administered 2015-08-01: 2000 mg via INTRAVENOUS
  Filled 2015-08-01: qty 2000

## 2015-08-01 MED ORDER — PIPERACILLIN-TAZOBACTAM IN DEX 2-0.25 GM/50ML IV SOLN
2.2500 g | Freq: Three times a day (TID) | INTRAVENOUS | Status: DC
  Filled 2015-08-01 (×2): qty 50

## 2015-08-01 MED ORDER — LIDOCAINE-PRILOCAINE 2.5-2.5 % EX CREA
1.0000 "application " | TOPICAL_CREAM | CUTANEOUS | Status: DC | PRN
  Filled 2015-08-01: qty 5

## 2015-08-01 MED ORDER — HEPARIN SODIUM (PORCINE) 1000 UNIT/ML DIALYSIS
1000.0000 [IU] | INTRAMUSCULAR | Status: DC | PRN
  Filled 2015-08-01: qty 1

## 2015-08-01 MED ORDER — HEPARIN SODIUM (PORCINE) 1000 UNIT/ML DIALYSIS
20.0000 [IU]/kg | INTRAMUSCULAR | Status: DC | PRN
  Filled 2015-08-01: qty 2

## 2015-08-01 MED ORDER — ALTEPLASE 2 MG IJ SOLR: 2.0000 mg | Freq: Once | INTRAMUSCULAR | Status: DC | PRN

## 2015-08-01 MED ORDER — ACETAMINOPHEN 325 MG PO TABS: 650.0000 mg | ORAL_TABLET | Freq: Four times a day (QID) | ORAL | Status: DC | PRN

## 2015-08-01 MED ORDER — LIDOCAINE-PRILOCAINE 2.5-2.5 % EX CREA: 1.0000 "application " | TOPICAL_CREAM | CUTANEOUS | Status: DC | PRN

## 2015-08-01 MED ORDER — PENTAFLUOROPROP-TETRAFLUOROETH EX AERO: 1.0000 "application " | INHALATION_SPRAY | CUTANEOUS | Status: DC | PRN

## 2015-08-01 MED ORDER — DEXTROSE 50 % IV SOLN
INTRAVENOUS | Status: AC
  Administered 2015-08-01: 50 mL
  Filled 2015-08-01: qty 50

## 2015-08-01 MED ORDER — GI COCKTAIL ~~LOC~~
30.0000 mL | Freq: Once | ORAL | Status: AC
  Administered 2015-08-01: 30 mL via ORAL
  Filled 2015-08-01 (×2): qty 30

## 2015-08-01 MED ORDER — HEPARIN SODIUM (PORCINE) 1000 UNIT/ML DIALYSIS: 1000.0000 [IU] | INTRAMUSCULAR | Status: DC | PRN

## 2015-08-01 MED ORDER — PIPERACILLIN-TAZOBACTAM IN DEX 2-0.25 GM/50ML IV SOLN
2.2500 g | Freq: Three times a day (TID) | INTRAVENOUS | Status: DC
  Administered 2015-08-01 – 2015-08-06 (×15): 2.25 g via INTRAVENOUS
  Filled 2015-08-01 (×20): qty 50

## 2015-08-01 MED ORDER — CETYLPYRIDINIUM CHLORIDE 0.05 % MT LIQD
7.0000 mL | Freq: Two times a day (BID) | OROMUCOSAL | Status: DC
  Administered 2015-08-02 – 2015-08-04 (×6): 7 mL via OROMUCOSAL

## 2015-08-01 MED ORDER — DEXTROSE 10 % IV SOLN
INTRAVENOUS | Status: DC
  Administered 2015-08-01: 75 mL/h via INTRAVENOUS

## 2015-08-01 MED ORDER — ACETAMINOPHEN 650 MG RE SUPP: 650.0000 mg | Freq: Four times a day (QID) | RECTAL | Status: DC | PRN

## 2015-08-01 MED ORDER — LIDOCAINE HCL (PF) 1 % IJ SOLN: 5.0000 mL | INTRAMUSCULAR | Status: DC | PRN

## 2015-08-01 MED ORDER — SODIUM CHLORIDE 0.9 % IV BOLUS (SEPSIS): 1000.0000 mL | Freq: Once | INTRAVENOUS | Status: DC

## 2015-08-01 MED ORDER — HEPARIN SODIUM (PORCINE) 1000 UNIT/ML DIALYSIS: 20.0000 [IU]/kg | INTRAMUSCULAR | Status: DC | PRN

## 2015-08-01 MED ORDER — NA FERRIC GLUC CPLX IN SUCROSE 12.5 MG/ML IV SOLN
62.5000 mg | INTRAVENOUS | Status: DC
  Administered 2015-08-02: 62.5 mg via INTRAVENOUS
  Filled 2015-08-01 (×2): qty 5

## 2015-08-01 MED ORDER — ONDANSETRON HCL 4 MG PO TABS: 4.0000 mg | ORAL_TABLET | Freq: Four times a day (QID) | ORAL | Status: DC | PRN

## 2015-08-01 MED ORDER — ONDANSETRON HCL 4 MG/2ML IJ SOLN
4.0000 mg | Freq: Four times a day (QID) | INTRAMUSCULAR | Status: DC | PRN
  Administered 2015-08-02 – 2015-08-05 (×2): 4 mg via INTRAVENOUS
  Filled 2015-08-01 (×2): qty 2

## 2015-08-01 NOTE — ED Notes (Signed)
CBG 82 

## 2015-08-01 NOTE — ED Notes (Addendum)
Pt taken off of [place]. Warm blankets placed on patient. Patient alert and oriented x4. Patient given sandwich and crackers to eat. Pt comfortable. Verbalizes no other needs at this time.

## 2015-08-01 NOTE — ED Notes (Signed)
Dr Leonides Schanz ordered CBG's q48mins

## 2015-08-01 NOTE — ED Notes (Signed)
Pt snoring, and SPO2 dropped into 70s, pt aroused and SPO2 back up 94%, Dr Leonides Schanz notified.

## 2015-08-01 NOTE — Consult Note (Signed)
[facility] Renal Consultation Note    Indication for Consultation:  Management of ESRD/hemodialysis; anemia, hypertension/volume and secondary hyperparathyroidism PCP:  HPI: Blake [name] is a 69 y.o. male with ESRD secondary to DM/HTN on HD since 06/2014 on TTS HD at [facility] who last dialyzed Tuesday without sequelae.  His pre and psot BP were 170 -180 pre and 180 - 150 post with a net UF of 1.8.  He has left slightly below his EDW of 97.5 at 97.2 for the last three HD treatments.    He presented to the ED this am with AMS and found to have a blood sugar of 33 and hypothermic at 95.1.  His family member had found him on the floor at home. He was treated with a D10 drip.  Pt states he has been vomiting for a week (no mention of this in outpt HD notes, but goals have been much more modest compared to prior goals of 3-6 kg. He has a history of chronic nausea. He states he eats solids or liquids and then a little while later, her vomits.  He states fruits don't cause this problem.  He denies abdominal pain, constipation or diarrhea. CXR shows CM and mild interstitial edema with small effusions.  He fell several days ago by tripping over his feet while walking to the bathroom. Head CT showed no acute change. He feels like the neuropathy in his feet is worse when his sugars are high.    Past Medical History  Diagnosis Date  . Diabetes mellitus without complication ([place])   . Hypertension   . Hyperlipidemia   . Renal disorder    Past Surgical History  Procedure Laterality Date  . Back surgery    . Insertion of dialysis catheter Left 07/05/2014    Procedure: INSERTION OF DIALYSIS CATHETER-LEFT INTERNAL JUGULAR PLACEMENT;  Surgeon: [name] Line, MD;  Location: [facility];  Service: Vascular;  Laterality: Left;  . Av fistula placement Right 07/05/2014    Procedure: ARTERIOVENOUS BRACHIALCEPHALIC (AV) FISTULA CREATION;  Surgeon: Conrad [place], MD;  Location: [facility];  Service:  Vascular;  Laterality: Right;  . Removal of a dialysis catheter Right 07/05/2014    Procedure: REMOVAL OF A DIALYSIS CATHETER;  Surgeon: Conrad [place], MD;  Location: [facility];  Service: Vascular;  Laterality: Right;   Family History  Problem Relation Age of Onset  . Diabetes Father   . Heart attack Father    Social History:  reports that he quit smoking about 35 years ago. His smoking use included Cigarettes. He quit after 5 years of use. He has never used smokeless tobacco. He reports that he does not drink alcohol or use illicit drugs. No Known Allergies Prior to Admission medications   Medication Sig Start Date End Date Taking? Authorizing Provider  allopurinol (ZYLOPRIM) 100 MG tablet  06/18/14   Historical Provider, MD  amLODipine (NORVASC) 10 MG tablet  06/18/14   Historical Provider, MD  aspirin EC 81 MG tablet Take 81 mg by mouth daily.    Historical Provider, MD  atorvastatin (LIPITOR) 20 MG tablet Take 20 mg by mouth daily.    Historical Provider, MD  buPROPion (WELLBUTRIN XL) 150 MG 24 hr tablet Take 300 mg by mouth daily.    Historical Provider, MD  calcitRIOL (ROCALTROL) 0.25 MCG capsule Take 0.25 mcg by mouth daily.    Historical Provider, MD  calcium acetate (PHOSLO) 667 MG capsule  08/14/14   Historical Provider, MD  calcium acetate (PHOSLO) 667 MG tablet Take 1 tablet by mouth 3 (three) times daily with meals. 08/23/14   Conrad [place], MD  DULoxetine (CYMBALTA) 20 MG capsule Take 1 capsule (20 mg total) by mouth daily. 07/09/14   Albertine Patricia, MD  furosemide (LASIX) 40 MG tablet  06/19/14   Historical Provider, MD  insulin aspart (NOVOLOG) 100 UNIT/ML injection Inject 4 Units into the skin 3 (three) times daily with meals. 07/09/14   Silver Huguenin Elgergawy, MD  insulin aspart (NOVOLOG) 100 UNIT/ML injection Inject 0-15 Units into the skin 3 (three) times daily with meals. 07/09/14   Silver Huguenin Elgergawy, MD  insulin glargine (LANTUS) 100 UNIT/ML injection Inject 0.25 mLs (25 Units  total) into the skin at bedtime. 07/09/14   Silver Huguenin Elgergawy, MD  NOVOLIN N RELION 100 UNIT/ML injection  06/03/14   Historical Provider, MD  NOVOLIN R RELION 100 UNIT/ML injection  06/13/14   Historical Provider, MD  Nutritional Supplements (FEEDING SUPPLEMENT, NEPRO CARB STEADY,) LIQD Take 237 mLs by mouth 2 (two) times daily between meals. 07/09/14   Silver Huguenin Elgergawy, MD  pantoprazole (PROTONIX) 40 MG tablet Take 1 tablet (40 mg total) by mouth daily. 07/09/14   Silver Huguenin Elgergawy, MD  sevelamer carbonate (RENVELA) 800 MG tablet Take 1 tablet (800 mg total) by mouth 3 (three) times daily with meals. 08/23/14   Conrad Riesel, MD   Current Facility-Administered Medications  Medication Dose Route Frequency Provider Last Rate Last Dose  . dextrose 10 % infusion   Intravenous Continuous Kristen N Ward, DO 150 mL/hr at 08/01/15 0716    . doxercalciferol (HECTOROL) injection 4 mcg  4 mcg Intravenous Q M,W,F-HD Alric Seton, PA-C      . Derrill Memo ON 08/02/2015] doxercalciferol (HECTOROL) injection 4 mcg  4 mcg Intravenous Q T,Th,Sa-HD Alric Seton, PA-C      . Derrill Memo ON 08/02/2015] ferric gluconate (NULECIT) 62.5 mg in sodium chloride 0.9 % 100 mL IVPB  62.5 mg Intravenous Q Sat-HD Alric Seton, PA-C      . piperacillin-tazobactam (ZOSYN) IVPB 2.25 g  2.25 g Intravenous 3 times per day Romona Curls, [facility]       Current Outpatient Prescriptions  Medication Sig Dispense Refill  . allopurinol (ZYLOPRIM) 100 MG tablet     . amLODipine (NORVASC) 10 MG tablet     . aspirin EC 81 MG tablet Take 81 mg by mouth daily.    Marland Kitchen atorvastatin (LIPITOR) 20 MG tablet Take 20 mg by mouth daily.    Marland Kitchen buPROPion (WELLBUTRIN XL) 150 MG 24 hr tablet Take 300 mg by mouth daily.    . calcitRIOL (ROCALTROL) 0.25 MCG capsule Take 0.25 mcg by mouth daily.    . calcium acetate (PHOSLO) 667 MG capsule     . calcium acetate (PHOSLO) 667 MG tablet Take 1 tablet by mouth 3 (three) times daily with meals. 90 tablet 11  .  DULoxetine (CYMBALTA) 20 MG capsule Take 1 capsule (20 mg total) by mouth daily.  3  . furosemide (LASIX) 40 MG tablet     . insulin aspart (NOVOLOG) 100 UNIT/ML injection Inject 4 Units into the skin 3 (three) times daily with meals. 10 mL 11  . insulin aspart (NOVOLOG) 100 UNIT/ML injection Inject 0-15 Units into the skin 3 (three) times daily with meals. 10 mL 11  . insulin glargine (LANTUS) 100 UNIT/ML injection Inject 0.25 mLs (25 Units total) into the skin at bedtime. 10 mL 11  .  NOVOLIN N RELION 100 UNIT/ML injection     . NOVOLIN R RELION 100 UNIT/ML injection     . Nutritional Supplements (FEEDING SUPPLEMENT, NEPRO CARB STEADY,) LIQD Take 237 mLs by mouth 2 (two) times daily between meals.  0  . pantoprazole (PROTONIX) 40 MG tablet Take 1 tablet (40 mg total) by mouth daily.    . sevelamer carbonate (RENVELA) 800 MG tablet Take 1 tablet (800 mg total) by mouth 3 (three) times daily with meals. 90 tablet 11   Labs: Basic Metabolic Panel:  Recent Labs Lab 08/01/15 0551  NA 141  K 3.3*  CL 103  CO2 28  GLUCOSE 65  BUN 54*  CREATININE 8.99*  CALCIUM 7.4*   Liver Function Tests:  Recent Labs Lab 08/01/15 0551  AST 29  ALT 21  ALKPHOS 84  BILITOT 0.5  PROT 6.4*  ALBUMIN 3.2*    Recent Labs Lab 08/01/15 0522  AMMONIA 27   CBC:  Recent Labs Lab 08/01/15 0551  WBC 7.4  NEUTROABS 5.0  HGB 10.9*  HCT 35.3*  MCV 92.2  PLT 180   CBG:  Recent Labs Lab 08/01/15 0726 08/01/15 0805 08/01/15 0859 08/01/15 1013 08/01/15 1111  GLUCAP 82 88 113* 144* 171*  Studies/Results: Ct Head Wo Contrast  08/01/2015  CLINICAL DATA:  Altered mental status. EXAM: CT HEAD WITHOUT CONTRAST TECHNIQUE: Contiguous axial images were obtained from the base of the skull through the vertex without intravenous contrast. COMPARISON:  None. FINDINGS: Mild motion related to patient's snoring. No intracranial hemorrhage, mass effect, or midline shift. No hydrocephalus. The basilar  cisterns are patent. Moderate periventricular white matter change, most consistent chronic small vessel ischemia. No evidence of territorial infarct. No intracranial fluid collection. Calvarium is intact. Included paranasal sinuses and mastoid air cells are well aerated. IMPRESSION: 1. No acute intracranial abnormality allowing for patient motion. 2. Moderate periventricular white matter change, most commonly related to chronic small vessel ischemia. Electronically Signed   By: Jeb Levering M.D.   On: 08/01/2015 06:27   Dg Chest Port 1 View  08/01/2015  CLINICAL DATA:  Altered mental status. EXAM: PORTABLE CHEST 1 VIEW COMPARISON:  PA and lateral chest 07/10/2014. Single view of the chest 07/05/2014. FINDINGS: Left IJ approach dialysis catheter present on the prior examinations has been removed. There is cardiomegaly and interstitial edema. Small bilateral pleural effusions are seen. IMPRESSION: Cardiomegaly and mild interstitial edema with associated small effusions. Electronically Signed   By: Inge Rise M.D.   On: 08/01/2015 07:18    ROS: As per HPI otherwise negative.  Physical Exam: Filed Vitals:   08/01/15 1015 08/01/15 1045 08/01/15 1100 08/01/15 1130  BP: 152/74 146/71 156/84 162/79  Pulse: 61 63 76 64  Temp:      TempSrc:      Resp: 14 16 15 14   Height:      Weight:      SpO2: 100% 100% 100% 100%     General: Well developed, well nourished, in no acute distress. Head: Normocephalic, atraumatic, sclera non-icteric, mucus membranes are moist Neck: Supple. JVD not elevated. Lungs:  Dim bases, grossly clear Heart: RRR with S1 S2. No rub Abdomen: Soft, non-tender, non-distended with normoactive bowel sounds. . Lower extremities: without edema or ischemic changes, no open wounds ; right great toe nail thick/toe calloused/no infection Neuro: Alert and oriented X 3. Moves all extremities spontaneously. Psych:  Responds to questions appropriately with a normal affect. Dialysis  Access: right upper AVF + bruit/thrill  Dialysis Orders: [facility] hr 400/800 2 K 2.5 Ca no profile heparin 9000 EDW 97.5 Hectorol 4 (recent decrease)  venofer 50/week- 11/10 , no ESA right upper AVF Recent Labs:  Hgb 11.6 37% sat Hgb A1c 6.2 iPTH 104   Assessment/Plan: 1. DM with hypoglycemia/hypothermia - primary managing sugars; rule out infection; coverage with broad spectrum antibiotics. 2. ESRD -  TTS - missed HD yesterday K 3.3 - use 4 K bath - plan shorter HD on Sat to get back on schedule 3. Hypertension/volume  - BP a little high at the out patient unit.  Give recent vomiting, suspect weight loss, titrate EDW down.  4. Anemia  - Hgb 10.8 no ESA for now,  Continue weekly Fe 5. Metabolic bone disease -  Continue hectorol - recently decreased; on renvela 1 ac; hold sensipar for now due to vomiting issue and iPTH is 104.  6. Nutrition - needs renal carb mod diet/vitamin 7. Vomiting - PPI - per primary  Myriam Jacobson, PA-C [place] [phone] 08/01/2015, 11:57 AM   Pt seen, examined and agree w A/P as above.  Kelly Splinter MD [facility] pager [phone]    cell [phone] 08/01/2015, 1:40 PM

## 2015-08-01 NOTE — Progress Notes (Signed)
ANTIBIOTIC CONSULT NOTE - INITIAL  Pharmacy Consult for vanc/zosyn Indication: r/o sepsis  No Known Allergies  Patient Measurements: Height: 5' 8.5" (174 cm) Weight: 216 lb 14.9 oz (98.4 kg) IBW/kg (Calculated) : 69.55   Vital Signs: Temp: 97.3 F (36.3 C) (11/18 0732) Temp Source: Rectal (11/18 0546) BP: 169/89 mmHg (11/18 0845) Pulse Rate: 70 (11/18 0845) Intake/Output from previous day:   Intake/Output from this shift:    Labs:  Recent Labs  08/01/15 0551  WBC 7.4  HGB 10.9*  PLT 180  CREATININE 8.99*   Estimated Creatinine Clearance: 9.1 mL/min (by C-G formula based on Cr of 8.99). No results for input(s): VANCOTROUGH, VANCOPEAK, VANCORANDOM, GENTTROUGH, GENTPEAK, GENTRANDOM, TOBRATROUGH, TOBRAPEAK, TOBRARND, AMIKACINPEAK, AMIKACINTROU, AMIKACIN in the last 72 hours.   Microbiology: No results found for this or any previous visit (from the past 720 hour(s)).  Medical History: Past Medical History  Diagnosis Date  . Diabetes mellitus without complication ([place])   . Hypertension   . Hyperlipidemia   . Renal disorder     Assessment: 73 yom with hypoglycemia and AMS on admit. ESRD- HD TThSa, patient fell yesterday and was unable to make HD. Pharmacy consulted to dose vanc/zosyn for r/o sepsis. Already given 1x doses in the ED. Nephrology consulted. Afebrile, wbc wnl. Will need to f/u HD schedule inpatient for vanc maintenance doses.  11/18 vanc>> 11/18 zosyn>>  11/18 BCx2>> 11/18 UC>>  Goal of Therapy:  Pre-HD vanc level 15-25 Post-HD vanc level 5-15  Plan:  Vanc 2g IV already given in ED (load dose ok) Zosyn 3.375g IV (72min inf) x1 in ED; then Zosyn 2.25g IV q8h Monitor clinical progress, c/s, abx plan/LOT Vanc levels as indicated F/u HD schedule inpatient for vanc maintenance doses  Elicia Lamp, PharmD Clinical Pharmacist Pager [phone] 08/01/2015 9:37 AM

## 2015-08-01 NOTE — Consult Note (Addendum)
WOC wound consult note Reason for Consult: Consult requested for right great toe wound.  Pt has been followed in the past by the [facility] team for a dry callous in Aug, according to the EMR, and no topical treatment was ordered at that time. He next went to a podiatrist who trimmed the callous, but pt admitted he could not afford to continue to go for follow-up because of lack of funds. Pt states wound has been present over 6 months, but he recently bumped it and it has increased pain.  X-rays are pending. Wound type: Full thickness dry callous to the tip of the right great toe. Measurement: 2X2cm dry hard eschar/callous, no odor or drainage or fluctuance. Periwound: .2cm area encircles the callous, dark purple-red bruising Dressing procedure/placement/frequency: It is best practice to leave dry stable eschar intact to toe; no topical treatment indicated unless site begins to have odor and drainage. If x-ray indicates osteomyelitis or a fracture, please consult ortho service for further plan of care. Discussed with patient and he verbalized understanding. Please re-consult if further assistance is needed.  Thank-you,  Julien Girt MSN, [place], [place], [place], [place]

## 2015-08-01 NOTE — ED Notes (Signed)
CBG: 113 °

## 2015-08-01 NOTE — ED Notes (Signed)
Portable x-ray in room 

## 2015-08-01 NOTE — ED Notes (Signed)
Attempted Report x1.   

## 2015-08-01 NOTE — ED Notes (Signed)
Pt states that he took 10units of insulin last night and none this morning. Pt does not remember what his blood sugar was .

## 2015-08-01 NOTE — ED Notes (Signed)
CBG 112.  

## 2015-08-01 NOTE — ED Notes (Addendum)
Per EMS, found by relative on the floor in his bedroom, pt was combative upon ems arrival and disoriented x 4. Initial cbg 33, pt given an amp of d50, then pt's cbg went to 150, then 30 minutes after in route pt's cbg dropped to 70, pt started on d10 continuous infusion. Sinus with frequent pvc's. Pt still disoriented x 4 upon arrival here. EMS VS 170/86, HR 60 sinus, RR 12, SPO2 on RA 97%. LSN unknown. Pt goes to dialysis right arm restricted

## 2015-08-01 NOTE — ED Notes (Signed)
Pt complaining of heartburn after GI cocktail. MD made aware

## 2015-08-01 NOTE — ED Notes (Signed)
Admitting MD at the bedside.  

## 2015-08-01 NOTE — ED Notes (Addendum)
CBG 89, Dr Ward notified

## 2015-08-01 NOTE — Progress Notes (Signed)
HD RN called with CBG results of 175.

## 2015-08-01 NOTE — ED Notes (Signed)
Ordered patient tray

## 2015-08-01 NOTE — ED Notes (Signed)
D10 going at 125/hr

## 2015-08-01 NOTE — ED Notes (Signed)
CBG 144 

## 2015-08-01 NOTE — ED Notes (Signed)
CBG 88 

## 2015-08-01 NOTE — H&P (Signed)
[facility] History and Physical  Blake [name] DOB: [birth year] DOA: 08/01/2015  Referring physician: Emergency Department PCP: [name], MD   CHIEF COMPLAINT: altered mental status  HPI: Blake [name] is a 69 y.o. male  with end-stage renal disease on hemodialysis. Patient brought into the emergency department today after family found him on the floor this morning. Patient was confused, having slurred speech. Blood sugars found to be 33. Torted on 10% dextrose drip. Blood sugar fell again, rate drip increased to 150 mL's per hour and blood sugar.  currently 89.  Patient states he had been vomiting for a week, unable to hold down fluids. He has a history of chronic nausea which had improved significantly with medication ( ? Anti-emetic). No abdominal pain. No diarrhea. Other recent than vomiting, patient has no other complaints   ED COURSE:   Hypothermic, improving with bearhugger Mental status improving.  Head CTscan -no acute findings  Labs:   Sodium 141, potassium 3.3 Ammonia 27 Urine tox screen negative Urinalysis:   pending  CXR:    IMPRESSION: Cardiomegaly and mild interstitial edema with associated small effusions.  EKG:    Sinus rhythm Ventricular premature complex Borderline prolonged QT interval No significant change since last tracing Confirmed by WARD, DO, KRISTEN ([id number]) on 08/01/2015 5:57:27 AM   Medications  dextrose 10 % infusion ( Intravenous Rate/Dose Change 08/01/15 0716)  vancomycin (VANCOCIN) 2,000 mg in sodium chloride 0.9 % 500 mL IVPB (2,000 mg Intravenous New Bag/Given 08/01/15 0708)  piperacillin-tazobactam (ZOSYN) IVPB 3.375 g (3.375 g Intravenous New Bag/Given 08/01/15 0707)  dextrose 50 % solution (50 mLs  Given 08/01/15 0540)    Review of Systems  Constitutional: Negative.   HENT: Negative.   Eyes: Negative.   Respiratory: Negative.   Cardiovascular: Negative.   Gastrointestinal: Positive for  vomiting.  Genitourinary: Negative.   Musculoskeletal: Positive for falls.  Skin: Negative.   Neurological: Negative.   Endo/Heme/Allergies: Negative.   Psychiatric/Behavioral: Negative.     Past Medical History  Diagnosis Date  . Diabetes mellitus without complication ([place])   . Hypertension   . Hyperlipidemia   . Renal disorder    Past Surgical History  Procedure Laterality Date  . Back surgery    . Insertion of dialysis catheter Left 07/05/2014    Procedure: INSERTION OF DIALYSIS CATHETER-LEFT INTERNAL JUGULAR PLACEMENT;  Surgeon: Conrad Hewlett, MD;  Location: [place];  Service: Vascular;  Laterality: Left;  . Av fistula placement Right 07/05/2014    Procedure: ARTERIOVENOUS BRACHIALCEPHALIC (AV) FISTULA CREATION;  Surgeon: Conrad Gattman, MD;  Location: [place];  Service: Vascular;  Laterality: Right;  . Removal of a dialysis catheter Right 07/05/2014    Procedure: REMOVAL OF A DIALYSIS CATHETER;  Surgeon: Conrad [place], MD;  Location: [place];  Service: Vascular;  Laterality: Right;    SOCIAL HISTORY:  reports that he quit smoking about 35 years ago. His smoking use included Cigarettes. He quit after 5 years of use. He has never used smokeless tobacco. He reports that he does not drink alcohol or use illicit drugs. Lives: at home with sister and niece at home    Assistive devices:  Uses cane for ambulation.   No Known Allergies  Family History  Problem Relation Age of Onset  . Diabetes Father   . Heart attack Father     Prior to Admission medications   Medication Sig Start Date End Date Taking? Authorizing Provider  allopurinol (ZYLOPRIM) 100 MG tablet  06/18/14  Historical Provider, MD  amLODipine (NORVASC) 10 MG tablet  06/18/14   Historical Provider, MD  aspirin EC 81 MG tablet Take 81 mg by mouth daily.    Historical Provider, MD  atorvastatin (LIPITOR) 20 MG tablet Take 20 mg by mouth daily.    Historical Provider, MD  buPROPion (WELLBUTRIN XL) 150 MG 24 hr tablet Take 300  mg by mouth daily.    Historical Provider, MD  calcitRIOL (ROCALTROL) 0.25 MCG capsule Take 0.25 mcg by mouth daily.    Historical Provider, MD  calcium acetate (PHOSLO) 667 MG capsule  08/14/14   Historical Provider, MD  calcium acetate (PHOSLO) 667 MG tablet Take 1 tablet by mouth 3 (three) times daily with meals. 08/23/14   Conrad [place], MD  DULoxetine (CYMBALTA) 20 MG capsule Take 1 capsule (20 mg total) by mouth daily. 07/09/14   Albertine Patricia, MD  furosemide (LASIX) 40 MG tablet  06/19/14   Historical Provider, MD  insulin aspart (NOVOLOG) 100 UNIT/ML injection Inject 4 Units into the skin 3 (three) times daily with meals. 07/09/14   Silver Huguenin Elgergawy, MD  insulin aspart (NOVOLOG) 100 UNIT/ML injection Inject 0-15 Units into the skin 3 (three) times daily with meals. 07/09/14   Silver Huguenin Elgergawy, MD  insulin glargine (LANTUS) 100 UNIT/ML injection Inject 0.25 mLs (25 Units total) into the skin at bedtime. 07/09/14   Silver Huguenin Elgergawy, MD  NOVOLIN N RELION 100 UNIT/ML injection  06/03/14   Historical Provider, MD  NOVOLIN R RELION 100 UNIT/ML injection  06/13/14   Historical Provider, MD  Nutritional Supplements (FEEDING SUPPLEMENT, NEPRO CARB STEADY,) LIQD Take 237 mLs by mouth 2 (two) times daily between meals. 07/09/14   Silver Huguenin Elgergawy, MD  pantoprazole (PROTONIX) 40 MG tablet Take 1 tablet (40 mg total) by mouth daily. 07/09/14   Silver Huguenin Elgergawy, MD  sevelamer carbonate (RENVELA) 800 MG tablet Take 1 tablet (800 mg total) by mouth 3 (three) times daily with meals. 08/23/14   Conrad Foyil, MD    Patient doesn't know home meds so unable to confirm he is taking above meds.    PHYSICAL EXAM: Filed Vitals:   08/01/15 0645 08/01/15 0700 08/01/15 0715 08/01/15 0722  BP:  162/83 159/74 159/74  Pulse:  63 60 60  Temp:      TempSrc:      Resp:  '18 14 13  ' Height: 5' 8.5" (1.74 m)     Weight: 98.4 kg (216 lb 14.9 oz)     SpO2:  97% 100% 100%    Wt Readings from Last 3  Encounters:  08/01/15 98.4 kg (216 lb 14.9 oz)  05/12/15 98.431 kg (217 lb)  08/23/14 92.987 kg (205 lb)    General:  Pleasant  black male. Appears calm and comfortable Eyes: PER, normal lids, irises & conjunctiva ENT: grossly normal hearing, lips & tongue Neck: no LAD, no masses Cardiovascular: RRR, no murmurs. No LE edema.  Respiratory: Respirations even and unlabored. Normal respiratory effort. Lungs CTA bilaterally, no wheezes / rales .   Abdomen: soft, non-distended, non-tender, active bowel sounds. No obvious masses.  Skin: no rash seen on limited exam Extremities: Right great toe necrotic appearing.  Musculoskeletal: grossly normal tone BUE/BLE Psychiatric: grossly normal mood and affect, speech fluent and appropriate Neurologic: grossly non-focal.         LABS ON ADMISSION:    Basic Metabolic Panel:  Recent Labs Lab 08/01/15 0551  NA 141  K 3.3*  CL  103  CO2 28  GLUCOSE 65  BUN 54*  CREATININE 8.99*  CALCIUM 7.4*   Liver Function Tests:  Recent Labs Lab 08/01/15 0551  AST 29  ALT 21  ALKPHOS 84  BILITOT 0.5  PROT 6.4*  ALBUMIN 3.2*    Recent Labs Lab 08/01/15 0522  AMMONIA 27   CBC:  Recent Labs Lab 08/01/15 0551  WBC 7.4  NEUTROABS 5.0  HGB 10.9*  HCT 35.3*  MCV 92.2  PLT 180   CBG:  Recent Labs Lab 08/01/15 0539 08/01/15 0547 08/01/15 0622 08/01/15 0656  GLUCAP 58* 251* 112* 89    CREATININE: 8.99 mg/dL ABNORMAL (08/01/15 0551) Estimated creatinine clearance - 9.1 mL/min  Radiological Exams on Admission: Ct Head Wo Contrast  08/01/2015  CLINICAL DATA:  Altered mental status. EXAM: CT HEAD WITHOUT CONTRAST TECHNIQUE: Contiguous axial images were obtained from the base of the skull through the vertex without intravenous contrast. COMPARISON:  None. FINDINGS: Mild motion related to patient's snoring. No intracranial hemorrhage, mass effect, or midline shift. No hydrocephalus. The basilar cisterns are patent. Moderate  periventricular white matter change, most consistent chronic small vessel ischemia. No evidence of territorial infarct. No intracranial fluid collection. Calvarium is intact. Included paranasal sinuses and mastoid air cells are well aerated. IMPRESSION: 1. No acute intracranial abnormality allowing for patient motion. 2. Moderate periventricular white matter change, most commonly related to chronic small vessel ischemia. Electronically Signed   By: Jeb Levering M.D.   On: 08/01/2015 06:27   Dg Chest Port 1 View  08/01/2015  CLINICAL DATA:  Altered mental status. EXAM: PORTABLE CHEST 1 VIEW COMPARISON:  PA and lateral chest 07/10/2014. Single view of the chest 07/05/2014. FINDINGS: Left IJ approach dialysis catheter present on the prior examinations has been removed. There is cardiomegaly and interstitial edema. Small bilateral pleural effusions are seen. IMPRESSION: Cardiomegaly and mild interstitial edema with associated small effusions. Electronically Signed   By: Inge Rise M.D.   On: 08/01/2015 07:18   Echo October 2015 Study Conclusions  - Left ventricle: Technically limited study. There is hypokinesis of the inferior and inferolateral walls. The EF seems tot be in the 40% range. The cavity size was normal. Wall thickness was increased in a pattern of mild LVH. Images were inadequate for LV wall motion assessment. - Left atrium: The atrium was mildly dilated. - Right ventricle: The cavity size was normal. Systolic function was mildly reduced.  ASSESSMENT / PLAN    Hypoglycemia with associated mental status changes. Etiology unclear patient states he took his scheduled dose of insulin last night (10 units of Lantus), none this am. Requiring 10% dextrose infusion to maintain blood glucose.  -Admit to stepdown  -Monitor CBGs   -Continue dextrose 10% infusion but will decrease rate to 48ms / hour . CXR reveals mild interstitial edema with small effusions. Will stop IVF  after 10 hours, low threshold for Bipap.  -Mental status improving, knows date/year but not which hospital he is in. If able to tolerate diet then may be able to stop Dextrose infusion earlier.   Hypothermia,  Temp 95.1. Rule out infectious etiology. Platelets / WBC normal.  -Bearhugger in place, temp up to 97.3 now.  -Blood and urine cultures pending -Broad-spectrum antibiotics started empirically in ED, will continue for now    ESRD on dialysis. Dialyzes T,Th, Sa. Patient fail yesterday, he was unable to make dialysis.   -Will consult Nephrology   Hypoxia. Sats fall into upper 80's when sleeping. Hopefully this  will improve as mental status returns to baseline. No known history of OSA.  -Continue continue pulse oximetry for now.   Diabetes, maintained on insulin. -Monitor CBGs Q 2 hours -Carb modified diet  Fall. Fell at home yesterday.  -PT evaluation   Hypertension. Stable. Continue home anti-hypertensives    Ulcer of toe of right foot.  No open lesions or swelling. Base of toe dark. Evaluated late August by Vascular. PPIs normal, ulcer felt to be more of a callus. No ulcer care in 8 months due to cost.  -wound care consult -CRP, ESR, xray of toe to evaluate for osteomyelitis.   CONSULTANTS:    Nephrology- Dr Melvia Heaps  Code Status: full code DVT Prophylaxis:  Heparin Family Communication:   Patient alert, oriented and understands plan of care.  Disposition Plan: Discharge to home in 2-3 days   Time spent: 60 minutes Tye Savoy  NP [facility] Pager [phone]

## 2015-08-01 NOTE — ED Provider Notes (Addendum)
TIME SEEN: 5:40 AM  CHIEF COMPLAINT: Hypoglycemia, altered mental status  HPI: Pt is a 69 y.o. male with history of insulin-dependent diabetes, hypertension, end-stage renal disease on hemodialysis Tuesday, Thursday, Saturday who was last dialyzed on Tuesday, 3 days ago who presents to the emergency department after he was found by a family member in his house altered, decreased responsiveness. Was found the floor of his bedroom. Upon EMS arrival they found his blood glucose to be 33. Was given 1 amp of D50 and his blood sugar dropped again. Started on 10% dextrose infusion. Last seen normal unknown.  ROS: Level V caveat for altered mental status   PAST MEDICAL HISTORY/PAST SURGICAL HISTORY:  Past Medical History  Diagnosis Date  . Diabetes mellitus without complication ([place])   . Hypertension   . Hyperlipidemia   . Renal disorder     MEDICATIONS:  Prior to Admission medications   Medication Sig Start Date End Date Taking? Authorizing Provider  allopurinol (ZYLOPRIM) 100 MG tablet  06/18/14   Historical Provider, MD  amLODipine (NORVASC) 10 MG tablet  06/18/14   Historical Provider, MD  aspirin EC 81 MG tablet Take 81 mg by mouth daily.    Historical Provider, MD  atorvastatin (LIPITOR) 20 MG tablet Take 20 mg by mouth daily.    Historical Provider, MD  buPROPion (WELLBUTRIN XL) 150 MG 24 hr tablet Take 300 mg by mouth daily.    Historical Provider, MD  calcitRIOL (ROCALTROL) 0.25 MCG capsule Take 0.25 mcg by mouth daily.    Historical Provider, MD  calcium acetate (PHOSLO) 667 MG capsule  08/14/14   Historical Provider, MD  calcium acetate (PHOSLO) 667 MG tablet Take 1 tablet by mouth 3 (three) times daily with meals. 08/23/14   Conrad [place], MD  DULoxetine (CYMBALTA) 20 MG capsule Take 1 capsule (20 mg total) by mouth daily. 07/09/14   Albertine Patricia, MD  furosemide (LASIX) 40 MG tablet  06/19/14   Historical Provider, MD  insulin aspart (NOVOLOG) 100 UNIT/ML injection Inject 4 Units  into the skin 3 (three) times daily with meals. 07/09/14   Silver Huguenin Elgergawy, MD  insulin aspart (NOVOLOG) 100 UNIT/ML injection Inject 0-15 Units into the skin 3 (three) times daily with meals. 07/09/14   Silver Huguenin Elgergawy, MD  insulin glargine (LANTUS) 100 UNIT/ML injection Inject 0.25 mLs (25 Units total) into the skin at bedtime. 07/09/14   Silver Huguenin Elgergawy, MD  NOVOLIN N RELION 100 UNIT/ML injection  06/03/14   Historical Provider, MD  NOVOLIN R RELION 100 UNIT/ML injection  06/13/14   Historical Provider, MD  Nutritional Supplements (FEEDING SUPPLEMENT, NEPRO CARB STEADY,) LIQD Take 237 mLs by mouth 2 (two) times daily between meals. 07/09/14   Silver Huguenin Elgergawy, MD  pantoprazole (PROTONIX) 40 MG tablet Take 1 tablet (40 mg total) by mouth daily. 07/09/14   Silver Huguenin Elgergawy, MD  sevelamer carbonate (RENVELA) 800 MG tablet Take 1 tablet (800 mg total) by mouth 3 (three) times daily with meals. 08/23/14   Conrad [place], MD    ALLERGIES:  No Known Allergies  SOCIAL HISTORY:  Social History  Substance Use Topics  . Smoking status: Former Smoker -- 5 years    Types: Cigarettes    Quit date: 08/24/1979  . Smokeless tobacco: Never Used  . Alcohol Use: No    FAMILY HISTORY: Family History  Problem Relation Age of Onset  . Diabetes Father   . Heart attack Father     EXAM:  BP 166/73 mmHg  Pulse 58  Temp(Src) 95.1 F (35.1 C) (Rectal)  Resp 11  SpO2 99% CONSTITUTIONAL: Alert and oriented 3, patient is very drowsy but is arousable with voice and will open his eyes, answer questions but does so with slightly slurred speech HEAD: Normocephalic, atraumatic  EYES: Conjunctivae clear, PERRL, pupils are 3-4 mm and equally reactive bilaterally  ENT: normal nose; no rhinorrhea; moist mucous membranes; pharynx without lesions noted NECK: Supple, no meningismus, no LAD  CARD: RRR; S1 and S2 appreciated; no murmurs, no clicks, no rubs, no gallops RESP: Normal chest excursion without  splinting or tachypnea; breath sounds clear and equal bilaterally; no wheezes, no rhonchi, no rales, no hypoxia or respiratory distress, speaking full sentences ABD/GI: Normal bowel sounds; non-distended; soft, non-tender, no rebound, no guarding, no peritoneal signs BACK:  The back appears normal and is non-tender to palpation, there is no CVA tenderness EXT: Normal ROM in all joints; non-tender to palpation; no edema; normal capillary refill; no cyanosis, no calf tenderness or swelling, AV fistula noted in the right upper extremity with good thrill and bruit and no surrounding erythema, warmth, induration or any drainage     SKIN: Normal color for age and race; warm, No rash, extremities are cool NEURO: Moves all extremities, patient can answer questions but quickly falls asleep, will follow some commands, opens eyes and will answer questions to voice, no obvious facial droop PSYCH: The patient's mood and manner are appropriate. Grooming and personal hygiene are appropriate.   MEDICAL DECISION Patient here with altered mental status likely secondary to hypoglycemia. Mental status improving with dextrose. Patient is unable to answer many questions and still seems very drowsy despite his blood glucose improving. He is protecting his airway, GCS greater than 8. Will obtain labs, urine, chest x-ray, head CT. Will continue dextrose 10% drip.    ED PROGRESS:  Patient's labs show no acute abnormality. Head CT shows no acute abnormality.     Patient is hypothermic but this is likely from being down from being hypoglycemic. However he has had sepsis in the past. Chest x-ray, urine and blood cultures are pending. Will give broad-spectrum antibiotics.   7:20 AM  Patient's mental status continues to improve but his blood glucose is dropping slowly. Will infuse his dextrose 10% drip from 125 mL an hour to 150 mL an hour. He does not appear significantly volume overloaded on exam despite him reporting missing  dialysis yesterday. He states he missed dialysis because he slipped and fell in his kitchen and could not get there because of pain. He denies that he is having any pain anywhere now. No sign of trauma on exam.     7:25 AM  D/w hospitalist for admission. I will place on orders.   7:30 AM  Pt continuing to improve. He states that he did not take his 15 units of Lantus last night but took 10 units of regular insulin before bedtime. States he is on sliding scale insulin and adjusts it with his meals. States over the last several days he has had vomiting. No diarrhea. No abdominal pain. Abdominal exam is benign. Denies any fever. I feel at this time he is able to drink, will give patient juice. Repeat blood sugar is 82.  Chest x-ray shows interstitial edema but no infiltrate.  Urine drug screen negative.  CKD chronic, BUN stable.  No metabolic acidosis.  No significant hypertension, volume overload, hypoxia, electrolyte abnormalities. He does not need emergent dialysis. Ammonia normal.  EKG Interpretation  Date/Time:  Friday August 01 2015 05:55:28 EST Ventricular Rate:  59 PR Interval:  200 QRS Duration: 90 QT Interval:  501 QTC Calculation: 496 R Axis:   39 Text Interpretation:  Sinus rhythm Ventricular premature complex Borderline prolonged QT interval No significant change since last tracing Confirmed by WARD,  DO, KRISTEN [id number]) on 08/01/2015 5:57:27 AM         CRITICAL CARE Performed by: Nyra Jabs   Total critical care time: 45 minutes  Critical care time was exclusive of separately billable procedures and treating other patients.  Critical care was necessary to treat or prevent imminent or life-threatening deterioration.  Critical care was time spent personally by me on the following activities: development of treatment plan with patient and/or surrogate as well as nursing, discussions with consultants, evaluation of patient's response to treatment, examination of  patient, obtaining history from patient or surrogate, ordering and performing treatments and interventions, ordering and review of laboratory studies, ordering and review of radiographic studies, pulse oximetry and re-evaluation of patient's condition.   [place], DO 08/01/15 [place], DO 08/01/15 0730

## 2015-08-01 NOTE — ED Notes (Signed)
Spoke with patient placement. Pt to have new bed assignment

## 2015-08-01 NOTE — ED Notes (Signed)
amb of D50 given by [facility]

## 2015-08-02 ENCOUNTER — Encounter (HOSPITAL_COMMUNITY): Payer: Self-pay | Admitting: Internal Medicine

## 2015-08-02 DIAGNOSIS — L97519 Non-pressure chronic ulcer of other part of right foot with unspecified severity: Secondary | ICD-10-CM | POA: Diagnosis present

## 2015-08-02 DIAGNOSIS — R0902 Hypoxemia: Secondary | ICD-10-CM | POA: Diagnosis present

## 2015-08-02 DIAGNOSIS — W19XXXD Unspecified fall, subsequent encounter: Secondary | ICD-10-CM | POA: Diagnosis not present

## 2015-08-02 DIAGNOSIS — N186 End stage renal disease: Secondary | ICD-10-CM | POA: Diagnosis not present

## 2015-08-02 DIAGNOSIS — Z992 Dependence on renal dialysis: Secondary | ICD-10-CM | POA: Diagnosis not present

## 2015-08-02 DIAGNOSIS — T68XXXA Hypothermia, initial encounter: Secondary | ICD-10-CM | POA: Diagnosis not present

## 2015-08-02 DIAGNOSIS — E162 Hypoglycemia, unspecified: Secondary | ICD-10-CM | POA: Diagnosis not present

## 2015-08-02 DIAGNOSIS — I120 Hypertensive chronic kidney disease with stage 5 chronic kidney disease or end stage renal disease: Secondary | ICD-10-CM | POA: Diagnosis not present

## 2015-08-02 DIAGNOSIS — T68XXXD Hypothermia, subsequent encounter: Secondary | ICD-10-CM | POA: Diagnosis not present

## 2015-08-02 DIAGNOSIS — G9341 Metabolic encephalopathy: Secondary | ICD-10-CM | POA: Diagnosis present

## 2015-08-02 DIAGNOSIS — R4781 Slurred speech: Secondary | ICD-10-CM | POA: Diagnosis present

## 2015-08-02 DIAGNOSIS — K219 Gastro-esophageal reflux disease without esophagitis: Secondary | ICD-10-CM | POA: Diagnosis present

## 2015-08-02 DIAGNOSIS — M898X9 Other specified disorders of bone, unspecified site: Secondary | ICD-10-CM | POA: Diagnosis present

## 2015-08-02 DIAGNOSIS — E8770 Fluid overload, unspecified: Secondary | ICD-10-CM | POA: Diagnosis present

## 2015-08-02 DIAGNOSIS — W19XXXA Unspecified fall, initial encounter: Secondary | ICD-10-CM | POA: Diagnosis present

## 2015-08-02 DIAGNOSIS — Z794 Long term (current) use of insulin: Secondary | ICD-10-CM | POA: Diagnosis not present

## 2015-08-02 DIAGNOSIS — Y92009 Unspecified place in unspecified non-institutional (private) residence as the place of occurrence of the external cause: Secondary | ICD-10-CM | POA: Diagnosis not present

## 2015-08-02 DIAGNOSIS — E11649 Type 2 diabetes mellitus with hypoglycemia without coma: Principal | ICD-10-CM

## 2015-08-02 DIAGNOSIS — E1122 Type 2 diabetes mellitus with diabetic chronic kidney disease: Secondary | ICD-10-CM | POA: Diagnosis present

## 2015-08-02 DIAGNOSIS — R1110 Vomiting, unspecified: Secondary | ICD-10-CM | POA: Diagnosis present

## 2015-08-02 DIAGNOSIS — E11621 Type 2 diabetes mellitus with foot ulcer: Secondary | ICD-10-CM | POA: Diagnosis present

## 2015-08-02 DIAGNOSIS — L851 Acquired keratosis [keratoderma] palmaris et plantaris: Secondary | ICD-10-CM | POA: Diagnosis not present

## 2015-08-02 DIAGNOSIS — Z7982 Long term (current) use of aspirin: Secondary | ICD-10-CM | POA: Diagnosis not present

## 2015-08-02 DIAGNOSIS — M86171 Other acute osteomyelitis, right ankle and foot: Secondary | ICD-10-CM | POA: Diagnosis not present

## 2015-08-02 DIAGNOSIS — E1169 Type 2 diabetes mellitus with other specified complication: Secondary | ICD-10-CM | POA: Diagnosis present

## 2015-08-02 DIAGNOSIS — Z87891 Personal history of nicotine dependence: Secondary | ICD-10-CM | POA: Diagnosis not present

## 2015-08-02 DIAGNOSIS — E785 Hyperlipidemia, unspecified: Secondary | ICD-10-CM | POA: Diagnosis present

## 2015-08-02 DIAGNOSIS — R531 Weakness: Secondary | ICD-10-CM | POA: Diagnosis not present

## 2015-08-02 DIAGNOSIS — E1140 Type 2 diabetes mellitus with diabetic neuropathy, unspecified: Secondary | ICD-10-CM | POA: Diagnosis present

## 2015-08-02 DIAGNOSIS — N2581 Secondary hyperparathyroidism of renal origin: Secondary | ICD-10-CM | POA: Diagnosis present

## 2015-08-02 DIAGNOSIS — R269 Unspecified abnormalities of gait and mobility: Secondary | ICD-10-CM | POA: Diagnosis not present

## 2015-08-02 DIAGNOSIS — S98111A Complete traumatic amputation of right great toe, initial encounter: Secondary | ICD-10-CM

## 2015-08-02 DIAGNOSIS — Z9115 Patient's noncompliance with renal dialysis: Secondary | ICD-10-CM | POA: Diagnosis not present

## 2015-08-02 DIAGNOSIS — M869 Osteomyelitis, unspecified: Secondary | ICD-10-CM | POA: Diagnosis not present

## 2015-08-02 HISTORY — DX: Complete traumatic amputation of right great toe, initial encounter: S98.111A

## 2015-08-02 LAB — CBC
HEMATOCRIT: 33.8 % — AB (ref 39.0–52.0)
HEMOGLOBIN: 10.9 g/dL — AB (ref 13.0–17.0)
MCH: 29.3 pg (ref 26.0–34.0)
MCHC: 32.2 g/dL (ref 30.0–36.0)
MCV: 90.9 fL (ref 78.0–100.0)
Platelets: 175 10*3/uL (ref 150–400)
RBC: 3.72 MIL/uL — AB (ref 4.22–5.81)
RDW: 13.2 % (ref 11.5–15.5)
WBC: 7.8 10*3/uL (ref 4.0–10.5)

## 2015-08-02 LAB — BASIC METABOLIC PANEL
ANION GAP: 10 (ref 5–15)
BUN: 17 mg/dL (ref 6–20)
CALCIUM: 7.5 mg/dL — AB (ref 8.9–10.3)
CHLORIDE: 100 mmol/L — AB (ref 101–111)
CO2: 27 mmol/L (ref 22–32)
Creatinine, Ser: 4.77 mg/dL — ABNORMAL HIGH (ref 0.61–1.24)
GFR calc non Af Amer: 11 mL/min — ABNORMAL LOW (ref >60)
GFR, EST AFRICAN AMERICAN: 13 mL/min — AB (ref >60)
Glucose, Bld: 168 mg/dL — ABNORMAL HIGH (ref 65–99)
POTASSIUM: 3.8 mmol/L (ref 3.5–5.1)
Sodium: 137 mmol/L (ref 135–145)

## 2015-08-02 LAB — URINE CULTURE: CULTURE: NO GROWTH

## 2015-08-02 LAB — GLUCOSE, CAPILLARY
GLUCOSE-CAPILLARY: 115 mg/dL — AB (ref 65–99)
GLUCOSE-CAPILLARY: 126 mg/dL — AB (ref 65–99)
GLUCOSE-CAPILLARY: 149 mg/dL — AB (ref 65–99)
GLUCOSE-CAPILLARY: 152 mg/dL — AB (ref 65–99)
GLUCOSE-CAPILLARY: 164 mg/dL — AB (ref 65–99)
GLUCOSE-CAPILLARY: 168 mg/dL — AB (ref 65–99)
GLUCOSE-CAPILLARY: 181 mg/dL — AB (ref 65–99)
Glucose-Capillary: 135 mg/dL — ABNORMAL HIGH (ref 65–99)

## 2015-08-02 LAB — HEPATITIS B SURFACE ANTIGEN: Hepatitis B Surface Ag: NEGATIVE

## 2015-08-02 MED ORDER — CALCITRIOL 0.25 MCG PO CAPS
0.2500 ug | ORAL_CAPSULE | Freq: Every day | ORAL | Status: DC
  Administered 2015-08-02 – 2015-08-05 (×3): 0.25 ug via ORAL
  Filled 2015-08-02 (×4): qty 1

## 2015-08-02 MED ORDER — ATORVASTATIN CALCIUM 20 MG PO TABS
20.0000 mg | ORAL_TABLET | Freq: Every day | ORAL | Status: DC
  Administered 2015-08-02 – 2015-08-06 (×4): 20 mg via ORAL
  Filled 2015-08-02 (×4): qty 1

## 2015-08-02 MED ORDER — NEPRO/CARBSTEADY PO LIQD: 237.0000 mL | Freq: Two times a day (BID) | ORAL | Status: DC

## 2015-08-02 MED ORDER — SEVELAMER CARBONATE 800 MG PO TABS
800.0000 mg | ORAL_TABLET | Freq: Three times a day (TID) | ORAL | Status: DC
  Administered 2015-08-02 – 2015-08-06 (×8): 800 mg via ORAL
  Filled 2015-08-02 (×10): qty 1

## 2015-08-02 MED ORDER — DOXERCALCIFEROL 4 MCG/2ML IV SOLN
INTRAVENOUS | Status: AC
  Administered 2015-08-02: 4 ug via INTRAVENOUS
  Filled 2015-08-02: qty 2

## 2015-08-02 MED ORDER — INSULIN ASPART 100 UNIT/ML ~~LOC~~ SOLN
0.0000 [IU] | Freq: Three times a day (TID) | SUBCUTANEOUS | Status: DC
  Administered 2015-08-04: 1 [IU] via SUBCUTANEOUS
  Administered 2015-08-05 – 2015-08-06 (×2): 3 [IU] via SUBCUTANEOUS

## 2015-08-02 MED ORDER — INSULIN ASPART 100 UNIT/ML ~~LOC~~ SOLN: 0.0000 [IU] | Freq: Every day | SUBCUTANEOUS | Status: DC

## 2015-08-02 MED ORDER — ASPIRIN EC 81 MG PO TBEC
81.0000 mg | DELAYED_RELEASE_TABLET | Freq: Every day | ORAL | Status: DC
  Administered 2015-08-02 – 2015-08-06 (×4): 81 mg via ORAL
  Filled 2015-08-02 (×4): qty 1

## 2015-08-02 MED ORDER — BUPROPION HCL ER (XL) 150 MG PO TB24
300.0000 mg | ORAL_TABLET | Freq: Every day | ORAL | Status: DC
  Administered 2015-08-02 – 2015-08-06 (×4): 300 mg via ORAL
  Filled 2015-08-02 (×4): qty 2
  Filled 2015-08-02: qty 1

## 2015-08-02 MED ORDER — VANCOMYCIN HCL IN DEXTROSE 1-5 GM/200ML-% IV SOLN
1000.0000 mg | Freq: Once | INTRAVENOUS | Status: AC
  Administered 2015-08-02: 1000 mg via INTRAVENOUS
  Filled 2015-08-02: qty 200

## 2015-08-02 MED ORDER — CALCIUM ACETATE (PHOS BINDER) 667 MG PO CAPS
667.0000 mg | ORAL_CAPSULE | Freq: Three times a day (TID) | ORAL | Status: DC
  Administered 2015-08-02 – 2015-08-06 (×7): 667 mg via ORAL
  Filled 2015-08-02 (×10): qty 1

## 2015-08-02 MED ORDER — PANTOPRAZOLE SODIUM 40 MG PO TBEC
40.0000 mg | DELAYED_RELEASE_TABLET | Freq: Every day | ORAL | Status: DC
  Administered 2015-08-02 – 2015-08-06 (×4): 40 mg via ORAL
  Filled 2015-08-02 (×4): qty 1

## 2015-08-02 MED ORDER — DULOXETINE HCL 20 MG PO CPEP
20.0000 mg | ORAL_CAPSULE | Freq: Every day | ORAL | Status: DC
  Administered 2015-08-02 – 2015-08-06 (×4): 20 mg via ORAL
  Filled 2015-08-02 (×5): qty 1

## 2015-08-02 MED ORDER — AMLODIPINE BESYLATE 10 MG PO TABS
10.0000 mg | ORAL_TABLET | Freq: Every day | ORAL | Status: DC
  Administered 2015-08-02 – 2015-08-06 (×4): 10 mg via ORAL
  Filled 2015-08-02 (×4): qty 1

## 2015-08-02 NOTE — Progress Notes (Addendum)
Progress Note   Blake [name] WAQ:[id number] DOB: Mar 13, 1946 DOA: 08/01/2015 PCP: [name], MD   Brief Narrative:   Blake [name] is an 69 y.o. male with a PMH of end-stage renal disease on HD who was admitted 08/01/15 after being found down with confusion and slurred speech. Prior to this, he had a one-week history of vomiting. Blood sugars were 33. Patient was started on a 10% dextrose drip on admission.  Assessment/Plan:   Principal Problem:   Hypoglycemia associated with diabetes (Blake [name]) with metabolic encephalopathy - Placed on a 10% dextrose drip on admission. Insulin currently on hold. - CBGs 152-199 overnight. CBGs are being monitored every 2 hours. - Mental status back to baseline. - D/C dextrose and monitor. If CBGs stable x 3 checks, transfer to floor.  Active Problems:   Hypoxia - Desaturates with activity.  CXR with interstitial edema and small effusions.  Suspect from volume overload from missing HD.    Diabetes mellitus with renal complications - Insulin currently on hold secondary to hypoglycemia. - Outpatient regimen: 25 units of Lantus daily, 4 units of NovoLog with meals along with SSI.  - Start SSI, insulin sensitive scale if CBGs elevated off dextrose.    Hypertension - Resume amlodipine.    ESRD on dialysis [facility]) /  - HD orders per nephrology. - Resume phoslo and calcitriol.    Ulcer of toe of right foot ([name])/right great toe osteomyelitis - We'll ask for wound care [name] evaluation. - Continue empiric vancomycin/Zosyn. - ESR 35, CRP 1.1. - Discussed case with Dr. Lorin Mercy who will see 08/03/15 and set up for surgery 08/04/15.    Hypothermia - Temperature is 95.1 on admission. Placed on a Bair hugger. - Blood and urine cultures pending. - Continue broad-spectrum empiric antibiotics for now.    Fall - CT of the head negative for acute findings.  - Physical therapy evaluation.    DVT Prophylaxis - Heparin  ordered.   Family Communication/Anticipated D/C date and plan/Code Status   Family Communication: No family at the bedside. Disposition Plan: Home when stable. Lives with his sister and niece. Will need home health PT. Anticipated D/C date:   Depends on orthopedic evaluation and whether surgery will be needed. Code Status: Full code.   IV Access:    AVF right upper arm  PIV   Procedures and diagnostic studies:   Ct Head Wo Contrast  08/01/2015  CLINICAL DATA:  Altered mental status. EXAM: CT HEAD WITHOUT CONTRAST TECHNIQUE: Contiguous axial images were obtained from the base of the skull through the vertex without intravenous contrast. COMPARISON:  None. FINDINGS: Mild motion related to patient's snoring. No intracranial hemorrhage, mass effect, or midline shift. No hydrocephalus. The basilar cisterns are patent. Moderate periventricular white matter change, most consistent chronic small vessel ischemia. No evidence of territorial infarct. No intracranial fluid collection. Calvarium is intact. Included paranasal sinuses and mastoid air cells are well aerated. IMPRESSION: 1. No acute intracranial abnormality allowing for patient motion. 2. Moderate periventricular white matter change, most commonly related to chronic small vessel ischemia. Electronically Signed   By: Jeb Levering M.D.   On: 08/01/2015 06:27   Dg Chest Port 1 View  08/01/2015  CLINICAL DATA:  Altered mental status. EXAM: PORTABLE CHEST 1 VIEW COMPARISON:  PA and lateral chest 07/10/2014. Single view of the chest 07/05/2014. FINDINGS: Left IJ approach dialysis catheter present on the prior examinations has been removed. There is cardiomegaly and interstitial edema. Small bilateral pleural effusions  are seen. IMPRESSION: Cardiomegaly and mild interstitial edema with associated small effusions. Electronically Signed   By: Inge Rise M.D.   On: 08/01/2015 07:18   Dg Toe Great Right  08/01/2015  CLINICAL DATA:   Ulcer on posterior right great toe x 6 months. Hx of diabetes. EXAM: RIGHT GREAT TOE COMPARISON:  02/25/2010 FINDINGS: There is loss of the white cortical line along portions of the distal tuft of the great toe consistent with osteomyelitis. There is soft tissue air along the plantar margin irregularity of the dorsal distal soft tissues. No fracture. No other evidence of osteomyelitis. Joints are normally aligned. IMPRESSION: Osteomyelitis of the distal tuft of the right great toe with adjacent soft tissue air likely from the skin ulcer. Electronically Signed   By: Lajean Manes M.D.   On: 08/01/2015 14:20     Medical Consultants:    Nephrology- Dr Melvia Heaps  Orthopedic Surgery- Dr. Lorin Mercy  Anti-Infectives:   Anti-infectives    Start     Dose/Rate Route Frequency Ordered Stop   08/01/15 1530  piperacillin-tazobactam (ZOSYN) IVPB 2.25 g     2.25 g 100 mL/hr over 30 Minutes Intravenous Every 8 hours 08/01/15 1450     08/01/15 1400  piperacillin-tazobactam (ZOSYN) IVPB 2.25 g  Status:  Discontinued     2.25 g 100 mL/hr over 30 Minutes Intravenous 3 times per day 08/01/15 0941 08/01/15 1450   08/01/15 0700  vancomycin (VANCOCIN) 2,000 mg in sodium chloride 0.9 % 500 mL IVPB     2,000 mg 250 mL/hr over 120 Minutes Intravenous  Once 08/01/15 0651 08/01/15 0908   08/01/15 0700  piperacillin-tazobactam (ZOSYN) IVPB 3.375 g     3.375 g 100 mL/hr over 30 Minutes Intravenous  Once 08/01/15 4327 08/01/15 0802      Subjective:   Blake [name] has not had any nausea or vomiting overnight. Ate breakfast.  No fevers/chills.  No current dyspnea, but does report DOE prior to admission.  Objective:    Filed Vitals:   08/01/15 2315 08/01/15 2335 08/02/15 0021 08/02/15 0403  BP: 172/64 156/70 146/61 147/65  Pulse: 78 77 75 66  Temp:   98.4 F (36.9 C) 98 F (36.7 C)  TempSrc:   Oral Oral  Resp: '16 16 15 17  ' Height:      Weight:   100.7 kg (222 lb 0.1 oz)   SpO2:   95% 98%     Intake/Output Summary (Last 24 hours) at 08/02/15 0733 Last data filed at 08/02/15 0600  Gross per 24 hour  Intake   1050 ml  Output   2500 ml  Net  -1450 ml   Filed Weights   08/01/15 1816 08/01/15 1935 08/02/15 0021  Weight: 102.83 kg (226 lb 11.2 oz) 102.9 kg (226 lb 13.7 oz) 100.7 kg (222 lb 0.1 oz)    Exam: Gen:  NAD Cardiovascular:  RRR, No M/R/G Respiratory:  Lungs CTAB Gastrointestinal:  Abdomen soft, NT/ND, + BS Extremities:  No C/E/C, right toe as pictured below:     Data Reviewed:    Labs: Basic Metabolic Panel:  Recent Labs Lab 08/01/15 0551 08/01/15 1353 08/02/15 0315  NA 141 140 137  K 3.3* 4.4 3.8  CL 103 101 100*  CO2 '28 26 27  ' GLUCOSE 65 236* 168*  BUN 54* 52* 17  CREATININE 8.99* 8.72* 4.77*  CALCIUM 7.4* 7.2* 7.5*  PHOS  --  4.8*  --    GFR Estimated Creatinine Clearance: 17.3 mL/min (by C-G  formula based on Cr of 4.77). Liver Function Tests:  Recent Labs Lab 08/01/15 0551 08/01/15 1353  AST 29  --   ALT 21  --   ALKPHOS 84  --   BILITOT 0.5  --   PROT 6.4*  --   ALBUMIN 3.2* 2.9*    Recent Labs Lab 08/01/15 0522  AMMONIA 27    CBC:  Recent Labs Lab 08/01/15 0551 08/01/15 1352 08/02/15 0315  WBC 7.4 6.8 7.8  NEUTROABS 5.0  --   --   HGB 10.9* 11.1* 10.9*  HCT 35.3* 34.6* 33.8*  MCV 92.2 91.1 90.9  PLT 180 170 175   CBG:  Recent Labs Lab 08/01/15 1826 08/02/15 0026 08/02/15 0232 08/02/15 0444 08/02/15 0543  GLUCAP 199* 149* 152* 164* 168*   Thyroid function studies:  Recent Labs  08/01/15 1352  TSH 0.642   Microbiology Recent Results (from the past 240 hour(s))  MRSA PCR Screening     Status: None   Collection Time: 08/01/15  7:09 PM  Result Value Ref Range Status   MRSA by PCR NEGATIVE NEGATIVE Final    Comment:        The GeneXpert MRSA Assay (FDA approved for NASAL specimens only), is one component of a comprehensive MRSA colonization surveillance program. It is not intended to  diagnose MRSA infection nor to guide or monitor treatment for MRSA infections.      Medications:   . antiseptic oral rinse  7 mL Mouth Rinse BID  . doxercalciferol  4 mcg Intravenous Q T,Th,Sa-HD  . ferric gluconate (FERRLECIT/NULECIT) IV  62.5 mg Intravenous Q Sat-HD  . heparin  5,000 Units Subcutaneous 3 times per day  . piperacillin-tazobactam (ZOSYN)  IV  2.25 g Intravenous Q8H   Continuous Infusions: . dextrose 75 mL/hr (08/01/15 1352)    Time spent: 35 minutes.  The patient is medically complex with multiple co-morbidities and is at high risk for clinical deterioration and requires high complexity decision making, coordination of care with orthopedic surgeon/nephrologist.    LOS: 1 day   Blake [name]  [facility] Pager [phone]. If unable to reach me by pager, please call my cell phone at [phone].  *Please refer to [email or web address], password [email or web address] to get updated schedule on who will round on this patient, as hospitalists switch teams weekly. If 7PM-7AM, please contact night-coverage at [email or web address], password [email or web address] for any overnight needs.  08/02/2015, 7:33 AM

## 2015-08-02 NOTE — Progress Notes (Signed)
ANTIBIOTIC CONSULT NOTE  Pharmacy Consult for vanc/zosyn Indication: r/o sepsis  No Known Allergies  Patient Measurements: Height: 5\' 8"  (172.7 cm) Weight: 222 lb 0.1 oz (100.7 kg) IBW/kg (Calculated) : 68.4   Vital Signs: Temp: 97.9 F (36.6 C) (11/19 0800) Temp Source: Oral (11/19 0800) BP: 152/68 mmHg (11/19 0800) Pulse Rate: 68 (11/19 0800) Intake/Output from previous day: 11/18 0701 - 11/19 0700 In: 1050 [I.V.:900; IV Piggyback:150] Out: 2500  Intake/Output from this shift:    Labs:  Recent Labs  08/01/15 0551 08/01/15 1352 08/01/15 1353 08/02/15 0315  WBC 7.4 6.8  --  7.8  HGB 10.9* 11.1*  --  10.9*  PLT 180 170  --  175  CREATININE 8.99*  --  8.72* 4.77*   Estimated Creatinine Clearance: 17.3 mL/min (by C-G formula based on Cr of 4.77). No results for input(s): VANCOTROUGH, VANCOPEAK, VANCORANDOM, GENTTROUGH, GENTPEAK, GENTRANDOM, TOBRATROUGH, TOBRAPEAK, TOBRARND, AMIKACINPEAK, AMIKACINTROU, AMIKACIN in the last 72 hours.   Microbiology: Recent Results (from the past 720 hour(s))  MRSA PCR Screening     Status: None   Collection Time: 08/01/15  7:09 PM  Result Value Ref Range Status   MRSA by PCR NEGATIVE NEGATIVE Final    Comment:        The GeneXpert MRSA Assay (FDA approved for NASAL specimens only), is one component of a comprehensive MRSA colonization surveillance program. It is not intended to diagnose MRSA infection nor to guide or monitor treatment for MRSA infections.    Assessment: 14 yom with hypoglycemia and AMS on admit. ESRD- HD TThSa but they missed HD Thursday. He received HD last night but did not get a dose of vancomycin afterwards.   11/18 vanc>> 11/18 zosyn>>  11/18 BCx2>> 11/18 UC>>  Goal of Therapy:  Pre-HD vanc level 15-25 Post-HD vanc level 5-15  Plan:  - Vanc 1gm IV x 1 now - Continue zosyn 2.25gm IV Q8H - F/u renal fxn, C&S, clinical status and pre-HD level when appropriate  Salome Arnt, PharmD,  BCPS Pager # [phone] 08/02/2015 9:26 AM

## 2015-08-02 NOTE — Progress Notes (Signed)
SATURATION QUALIFICATIONS: (This note is used to comply with regulatory documentation for home oxygen)  Patient Saturations on Room Air at Rest = 93%  Patient Saturations on Room Air while Ambulating = 74-86%  Patient Saturations on 2 Liters of oxygen while Ambulating = 90-93%  Please briefly explain why patient needs home oxygen:Pt may need home O2.  Thanks.  [place] [phone] (pager)

## 2015-08-02 NOTE — Progress Notes (Signed)
Patient arrived on unit from [facility].  Telemetry placed per MD order. CMT notified.

## 2015-08-02 NOTE — Evaluation (Signed)
Physical Therapy Evaluation Patient Details Name: Blake [name] MRN: [id number] DOB: 02/27/1946 Today's Date: 08/02/2015   History of Present Illness  Blake [name] is an 69 y.o. male with a PMH of end-stage renal disease on HD who was admitted 08/01/15 after being found down with confusion and slurred speech. Prior to this, he had a one-week history of vomiting. Blood sugars were 33.   Clinical Impression  Pt admitted with above diagnosis. Pt currently with functional limitations due to the deficits listed below (see PT Problem List). Pt able to ambulate on unit with steady gait with RW.  Should progress well.  Uses cane at baseline.  Will follow acutely.  Pt will benefit from skilled PT to increase their independence and safety with mobility to allow discharge to the venue listed below.      Follow Up Recommendations Home health PT;Supervision/Assistance - 24 hour    Equipment Recommendations  Other (comment) (TBA)    Recommendations for Other Services       Precautions / Restrictions Precautions Precautions: Fall Restrictions Weight Bearing Restrictions: No      Mobility  Bed Mobility Overal bed mobility: Independent                Transfers Overall transfer level: Needs assistance Equipment used: Rolling walker (2 wheeled) Transfers: Sit to/from Stand Sit to Stand: Min guard         General transfer comment: cues for hand placement needed  Ambulation/Gait Ambulation/Gait assistance: Min guard Ambulation Distance (Feet): 125 Feet Assistive device: Rolling walker (2 wheeled) Gait Pattern/deviations: Step-through pattern;Decreased stride length   Gait velocity interpretation: Below normal speed for age/gender General Gait Details: Pt ambulated with RW with min guard assist and occasional cues for walker safety and for staying close to RW.   Stairs            Wheelchair Mobility    Modified Rankin (Stroke Patients Only)       Balance  Overall balance assessment: Needs assistance;History of Falls Sitting-balance support: No upper extremity supported;Feet supported Sitting balance-Leahy Scale: Fair     Standing balance support: Bilateral upper extremity supported;During functional activity Standing balance-Leahy Scale: Poor Standing balance comment: relies on UE support at present                             Pertinent Vitals/Pain Pain Assessment: No/denies pain  Desat 74-86% RA therefore applied 2LO2 and sats >90%.     Home Living Family/patient expects to be discharged to:: Private residence Living Arrangements: Other relatives (niece and sister) Available Help at Discharge: Family;Available 24 hours/day (niece works and sister can help) Type of Home: House Home Access: Stairs to enter Entrance Stairs-Rails: Right;Left;Can reach both Entrance Stairs-Number of Steps: 3 Home Layout: One level Home Equipment: [place] - quad;Shower seat      Prior Function Level of Independence: Independent with assistive device(s)         Comments: Pt used quad cane most of time.  I with bath and dressing     Hand Dominance   Dominant Hand: Right    Extremity/Trunk Assessment   Upper Extremity Assessment: Defer to OT evaluation           Lower Extremity Assessment: Overall WFL for tasks assessed      Cervical / Trunk Assessment: Normal  Communication   Communication: No difficulties  Cognition Arousal/Alertness: Awake/alert Behavior During Therapy: WFL for tasks assessed/performed Overall Cognitive Status:  Within Functional Limits for tasks assessed                      General Comments      Exercises        Assessment/Plan    PT Assessment Patient needs continued PT services  PT Diagnosis Generalized weakness   PT Problem List Decreased activity tolerance;Decreased balance;Decreased mobility;Decreased knowledge of use of DME;Decreased safety awareness;Decreased knowledge of  precautions  PT Treatment Interventions DME instruction;Gait training;Functional mobility training;Therapeutic activities;Therapeutic exercise;Balance training;Stair training;Patient/family education   PT Goals (Current goals can be found in the Care Plan section) Acute Rehab PT Goals Patient Stated Goal: to get better PT Goal Formulation: With patient Time For Goal Achievement: 08/16/15 Potential to Achieve Goals: Good    Frequency Min 3X/week   Barriers to discharge        Co-evaluation               End of Session Equipment Utilized During Treatment: Gait belt;Oxygen Activity Tolerance: Patient limited by fatigue Patient left: in chair;with call bell/phone within reach Nurse Communication: Mobility status         Time: [id number] PT Time Calculation (min) (ACUTE ONLY): 16 min   Charges:   PT Evaluation $Initial PT Evaluation Tier I: 1 Procedure     PT G CodesDenice Paradise 2015-08-27, 12:16 PM [place] [facility] Acute Rehabilitation [phone] (pager)

## 2015-08-02 NOTE — Progress Notes (Signed)
Pt transferred to 6E-21 via wheelchair accompanied by NA. Report called to Cassell Smiles, RN prior to transferring pt. All questions answered per receiving RN.

## 2015-08-02 NOTE — Progress Notes (Signed)
Utilization Review Completed.  

## 2015-08-02 NOTE — Progress Notes (Signed)
[facility] Progress Note  Assessment/Plan: 1. DM with hypoglycemia/hypothermia - primary managing sugars; rule out infection; coverage with broad spectrum antibiotics.BS now stable 2. ESRD - TTS - had HD Friday due to missed Thursday then shorter HD on Sat to get back on schedule 3. Hypertension/volume - BP variable Give recent vomiting, suspect weight loss, titrate EDW downl will try to get standing weight pre HD; Net UF 2.5 on Friday with no post weight 4. Anemia - Hgb 10.9 no ESA for now, Continue weekly Fe 5. Metabolic bone disease - Continue hectorol - recently decreased; on renvela 1 ac; hold sensipar for now due to vomiting issue and iPTH is 104.  6. Nutrition - needs renal carb mod diet/vitamin 7. Vomiting - PPI - per primary 8. Right great toe ulceration - bleeding this am-osteo on distal tip - podiatry had been following but he quit going b/c he didn't want to pay co-pay of $50- Dr. Lorin Mercy to do surgery 11/21  Myriam Jacobson, PA-C [place] [phone] 08/02/2015,11:10 AM  LOS: 1 day   Pt seen, examined and agree w A/P as above.  Kelly Splinter MD [facility] pager [phone]    cell [phone] 08/02/2015, 11:23 AM    Subjective:   No c/o  Objective Filed Vitals:   08/02/15 0800 08/02/15 0900 08/02/15 1000 08/02/15 1100  BP: 152/68 134/75 105/90 140/95  Pulse: 68 69 77   Temp: 97.9 F (36.6 C)     TempSrc: Oral     Resp: 17 15 15    Height:      Weight:      SpO2: 98% 100% 99%    Physical Exam General: NAD Heart: RRR Lungs: no rales Abdomen: soft NT Extremities: no sig LE edema; right great toe ulceration/slight bleeding Dialysis Access: right upper AVF + bruit - right arm more swollen but slept on that side and leaning on that side  Dialysis Orders: [facility]: [facility] 4 hr 400/800 2 K 2.5 Ca no profile heparin 9000 EDW 97.5 Hectorol 4 (recent decrease) venofer 50/week- 11/10 , no ESA right upper  AVF Recent Labs: Hgb 11.6 37% sat Hgb A1c 6.2 iPTH 104    Additional Objective Labs: Basic Metabolic Panel:  Recent Labs Lab 08/01/15 0551 08/01/15 1353 08/02/15 0315  NA 141 140 137  K 3.3* 4.4 3.8  CL 103 101 100*  CO2 28 26 27   GLUCOSE 65 236* 168*  BUN 54* 52* 17  CREATININE 8.99* 8.72* 4.77*  CALCIUM 7.4* 7.2* 7.5*  PHOS  --  4.8*  --    Liver Function Tests:  Recent Labs Lab 08/01/15 0551 08/01/15 1353  AST 29  --   ALT 21  --   ALKPHOS 84  --   BILITOT 0.5  --   PROT 6.4*  --   ALBUMIN 3.2* 2.9*  CBC:  Recent Labs Lab 08/01/15 0551 08/01/15 1352 08/02/15 0315  WBC 7.4 6.8 7.8  NEUTROABS 5.0  --   --   HGB 10.9* 11.1* 10.9*  HCT 35.3* 34.6* 33.8*  MCV 92.2 91.1 90.9  PLT 180 170 175     Recent Labs Lab 08/02/15 0232 08/02/15 0444 08/02/15 0543 08/02/15 0829 08/02/15 1032  GLUCAP 152* 164* 168* 135* 181*    Studies/Results: Ct Head Wo Contrast  08/01/2015  CLINICAL DATA:  Altered mental status. EXAM: CT HEAD WITHOUT CONTRAST TECHNIQUE: Contiguous axial images were obtained from the base of the skull through the vertex without intravenous contrast.  COMPARISON:  None. FINDINGS: Mild motion related to patient's snoring. No intracranial hemorrhage, mass effect, or midline shift. No hydrocephalus. The basilar cisterns are patent. Moderate periventricular white matter change, most consistent chronic small vessel ischemia. No evidence of territorial infarct. No intracranial fluid collection. Calvarium is intact. Included paranasal sinuses and mastoid air cells are well aerated. IMPRESSION: 1. No acute intracranial abnormality allowing for patient motion. 2. Moderate periventricular white matter change, most commonly related to chronic small vessel ischemia. Electronically Signed   By: Jeb Levering M.D.   On: 08/01/2015 06:27   Dg Chest Port 1 View  08/01/2015  CLINICAL DATA:  Altered mental status. EXAM: PORTABLE CHEST 1 VIEW COMPARISON:  PA and  lateral chest 07/10/2014. Single view of the chest 07/05/2014. FINDINGS: Left IJ approach dialysis catheter present on the prior examinations has been removed. There is cardiomegaly and interstitial edema. Small bilateral pleural effusions are seen. IMPRESSION: Cardiomegaly and mild interstitial edema with associated small effusions. Electronically Signed   By: Inge Rise M.D.   On: 08/01/2015 07:18   Dg Toe Great Right  08/01/2015  CLINICAL DATA:  Ulcer on posterior right great toe x 6 months. Hx of diabetes. EXAM: RIGHT GREAT TOE COMPARISON:  02/25/2010 FINDINGS: There is loss of the white cortical line along portions of the distal tuft of the great toe consistent with osteomyelitis. There is soft tissue air along the plantar margin irregularity of the dorsal distal soft tissues. No fracture. No other evidence of osteomyelitis. Joints are normally aligned. IMPRESSION: Osteomyelitis of the distal tuft of the right great toe with adjacent soft tissue air likely from the skin ulcer. Electronically Signed   By: Lajean Manes M.D.   On: 08/01/2015 14:20   Medications:   . amLODipine  10 mg Oral Daily  . antiseptic oral rinse  7 mL Mouth Rinse BID  . aspirin EC  81 mg Oral Daily  . atorvastatin  20 mg Oral Daily  . buPROPion  300 mg Oral Daily  . calcitRIOL  0.25 mcg Oral Daily  . calcium acetate  667 mg Oral TID WC  . doxercalciferol  4 mcg Intravenous Q T,Th,Sa-HD  . DULoxetine  20 mg Oral Daily  . feeding supplement (NEPRO CARB STEADY)  237 mL Oral BID BM  . ferric gluconate (FERRLECIT/NULECIT) IV  62.5 mg Intravenous Q Sat-HD  . heparin  5,000 Units Subcutaneous 3 times per day  . pantoprazole  40 mg Oral Daily  . piperacillin-tazobactam (ZOSYN)  IV  2.25 g Intravenous Q8H  . sevelamer carbonate  800 mg Oral TID WC  . vancomycin  1,000 mg Intravenous Once

## 2015-08-03 DIAGNOSIS — E1121 Type 2 diabetes mellitus with diabetic nephropathy: Secondary | ICD-10-CM

## 2015-08-03 DIAGNOSIS — T68XXXD Hypothermia, subsequent encounter: Secondary | ICD-10-CM

## 2015-08-03 LAB — RENAL FUNCTION PANEL
ANION GAP: 9 (ref 5–15)
Albumin: 3.1 g/dL — ABNORMAL LOW (ref 3.5–5.0)
BUN: 23 mg/dL — ABNORMAL HIGH (ref 6–20)
CO2: 27 mmol/L (ref 22–32)
Calcium: 7.9 mg/dL — ABNORMAL LOW (ref 8.9–10.3)
Chloride: 97 mmol/L — ABNORMAL LOW (ref 101–111)
Creatinine, Ser: 5.67 mg/dL — ABNORMAL HIGH (ref 0.61–1.24)
GFR calc Af Amer: 11 mL/min — ABNORMAL LOW (ref >60)
GFR calc non Af Amer: 9 mL/min — ABNORMAL LOW (ref >60)
GLUCOSE: 120 mg/dL — AB (ref 65–99)
POTASSIUM: 4.5 mmol/L (ref 3.5–5.1)
Phosphorus: 4 mg/dL (ref 2.5–4.6)
Sodium: 133 mmol/L — ABNORMAL LOW (ref 135–145)

## 2015-08-03 LAB — CBC
HCT: 36.5 % — ABNORMAL LOW (ref 39.0–52.0)
Hemoglobin: 11.6 g/dL — ABNORMAL LOW (ref 13.0–17.0)
MCH: 28.5 pg (ref 26.0–34.0)
MCHC: 31.8 g/dL (ref 30.0–36.0)
MCV: 89.7 fL (ref 78.0–100.0)
Platelets: 168 10*3/uL (ref 150–400)
RBC: 4.07 MIL/uL — AB (ref 4.22–5.81)
RDW: 12.8 % (ref 11.5–15.5)
WBC: 5.5 10*3/uL (ref 4.0–10.5)

## 2015-08-03 LAB — GLUCOSE, CAPILLARY
Glucose-Capillary: 87 mg/dL (ref 65–99)
Glucose-Capillary: 116 mg/dL — ABNORMAL HIGH (ref 65–99)
Glucose-Capillary: 107 mg/dL — ABNORMAL HIGH (ref 65–99)
Glucose-Capillary: 191 mg/dL — ABNORMAL HIGH (ref 65–99)

## 2015-08-03 MED ORDER — HEPARIN SODIUM (PORCINE) 1000 UNIT/ML DIALYSIS: 4000.0000 [IU] | INTRAMUSCULAR | Status: DC | PRN

## 2015-08-03 MED ORDER — SODIUM CHLORIDE 0.9 % IV SOLN: 100.0000 mL | INTRAVENOUS | Status: DC | PRN

## 2015-08-03 MED ORDER — VANCOMYCIN HCL IN DEXTROSE 1-5 GM/200ML-% IV SOLN
1000.0000 mg | Freq: Once | INTRAVENOUS | Status: AC
  Administered 2015-08-03: 1000 mg via INTRAVENOUS
  Filled 2015-08-03 (×2): qty 200

## 2015-08-03 MED ORDER — LIDOCAINE HCL (PF) 1 % IJ SOLN: 5.0000 mL | INTRAMUSCULAR | Status: DC | PRN

## 2015-08-03 MED ORDER — LIDOCAINE-PRILOCAINE 2.5-2.5 % EX CREA: 1.0000 "application " | TOPICAL_CREAM | CUTANEOUS | Status: DC | PRN

## 2015-08-03 MED ORDER — PENTAFLUOROPROP-TETRAFLUOROETH EX AERO: 1.0000 "application " | INHALATION_SPRAY | CUTANEOUS | Status: DC | PRN

## 2015-08-03 MED ORDER — ALTEPLASE 2 MG IJ SOLR: 2.0000 mg | Freq: Once | INTRAMUSCULAR | Status: DC | PRN

## 2015-08-03 MED ORDER — HEPARIN SODIUM (PORCINE) 1000 UNIT/ML DIALYSIS: 1000.0000 [IU] | INTRAMUSCULAR | Status: DC | PRN

## 2015-08-03 NOTE — Progress Notes (Signed)
[facility] Progress Note  Assessment: 1. DM with hypoglycemia/hypothermia - primary managing sugars; rule out infection; coverage with broad spectrum antibiotics.BS now stable 2. R great toe ischemia/ osteo- Dr. Lorin Mercy to do surgery 11/21. Emp abx 3. Hypoxemia /pulm edema/vol excess - plan extra HD today. Not symptomatic but CXR +edema 4. ESRD - TTS HD 5. HTN -good control on norvasc only 6. Anemia - Hgb 10.9 no ESA for now, Continue weekly Fe 7. Metabolic bone disease - Continue hectorol; on renvela 1 ac; hold sensipar for now due to vomiting issue and iPTH is 104.  8. Nutrition - needs renal carb mod diet/vitamin  Plan - HD today, get vol down, lower dry weight. For surgery tomorrow.   Kelly Splinter MD [facility] pager [phone]    cell [phone] 08/03/2015, 11:52 AM    Subjective:   No c/o  Objective Filed Vitals:   08/02/15 1556 08/02/15 2119 08/03/15 0511 08/03/15 0822  BP: 125/52 141/57 131/66 146/67  Pulse: 77 76 71 88  Temp: 98.5 F (36.9 C) 97.7 F (36.5 C) 97.7 F (36.5 C) 97.8 F (36.6 C)  TempSrc: Oral Oral Oral Oral  Resp: 17 18 14 16   Height:      Weight:      SpO2: 92% 100% 93% 100%   Physical Exam General: NAD Heart: RRR Lungs: no rales Abdomen: soft NT Extremities: no sig LE edema; right great toe ulceration distal 50% of toe Dialysis Access: right upper AVF + bruit Neuro is alert , ox 3  Dialysis Orders: [facility]: [facility] 4 hr 400/800 2 K 2.5 Ca no profile heparin 9000 EDW 97.5 Hectorol 4 (recent decrease) venofer 50/week- 11/10 , no ESA right upper AVF Recent Labs: Hgb 11.6 37% sat Hgb A1c 6.2 iPTH 104    Additional Objective Labs: Basic Metabolic Panel:  Recent Labs Lab 08/01/15 0551 08/01/15 1353 08/02/15 0315  NA 141 140 137  K 3.3* 4.4 3.8  CL 103 101 100*  CO2 28 26 27   GLUCOSE 65 236* 168*  BUN 54* 52* 17  CREATININE 8.99* 8.72* 4.77*  CALCIUM 7.4* 7.2* 7.5*  PHOS  --  4.8*  --     Liver Function Tests:  Recent Labs Lab 08/01/15 0551 08/01/15 1353  AST 29  --   ALT 21  --   ALKPHOS 84  --   BILITOT 0.5  --   PROT 6.4*  --   ALBUMIN 3.2* 2.9*  CBC:  Recent Labs Lab 08/01/15 0551 08/01/15 1352 08/02/15 0315  WBC 7.4 6.8 7.8  NEUTROABS 5.0  --   --   HGB 10.9* 11.1* 10.9*  HCT 35.3* 34.6* 33.8*  MCV 92.2 91.1 90.9  PLT 180 170 175     Recent Labs Lab 08/02/15 1032 08/02/15 1555 08/02/15 2117 08/03/15 0734 08/03/15 1113  GLUCAP 181* 115* 126* 87 116*    Studies/Results: Dg Toe Great Right  08/01/2015  CLINICAL DATA:  Ulcer on posterior right great toe x 6 months. Hx of diabetes. EXAM: RIGHT GREAT TOE COMPARISON:  02/25/2010 FINDINGS: There is loss of the white cortical line along portions of the distal tuft of the great toe consistent with osteomyelitis. There is soft tissue air along the plantar margin irregularity of the dorsal distal soft tissues. No fracture. No other evidence of osteomyelitis. Joints are normally aligned. IMPRESSION: Osteomyelitis of the distal tuft of the right great toe with adjacent soft tissue air likely from the skin ulcer. Electronically Signed  By: Lajean Manes M.D.   On: 08/01/2015 14:20   Medications:   . amLODipine  10 mg Oral Daily  . antiseptic oral rinse  7 mL Mouth Rinse BID  . aspirin EC  81 mg Oral Daily  . atorvastatin  20 mg Oral Daily  . buPROPion  300 mg Oral Daily  . calcitRIOL  0.25 mcg Oral Daily  . calcium acetate  667 mg Oral TID WC  . doxercalciferol  4 mcg Intravenous Q T,Th,Sa-HD  . DULoxetine  20 mg Oral Daily  . feeding supplement (NEPRO CARB STEADY)  237 mL Oral BID BM  . ferric gluconate (FERRLECIT/NULECIT) IV  62.5 mg Intravenous Q Sat-HD  . heparin  5,000 Units Subcutaneous 3 times per day  . insulin aspart  0-5 Units Subcutaneous QHS  . insulin aspart  0-9 Units Subcutaneous TID WC  . pantoprazole  40 mg Oral Daily  . piperacillin-tazobactam (ZOSYN)  IV  2.25 g Intravenous Q8H   . sevelamer carbonate  800 mg Oral TID WC

## 2015-08-03 NOTE — Consult Note (Signed)
Reason for Consult: right great toe Osteomyelitis Referring Physician: Dyann Kief MD  Blake [name] is an 69 y.o. male.  HPI: 69 yo male with ESRD, DM with right great toe ulcer. Admitted for hypoglycemia, poor PO intake, N and V,  " Not feeling well"  Past Medical History  Diagnosis Date  . Diabetes mellitus without complication ([place])   . Hypertension   . Hyperlipidemia   . Renal disorder   . Acute osteomyelitis of toe of right foot ([place]) 08/02/2015    Past Surgical History  Procedure Laterality Date  . Back surgery    . Insertion of dialysis catheter Left 07/05/2014    Procedure: INSERTION OF DIALYSIS CATHETER-LEFT INTERNAL JUGULAR PLACEMENT;  Surgeon: Conrad Horse [place], MD;  Location: [facility];  Service: Vascular;  Laterality: Left;  . Av fistula placement Right 07/05/2014    Procedure: ARTERIOVENOUS BRACHIALCEPHALIC (AV) FISTULA CREATION;  Surgeon: Conrad [place], MD;  Location: [facility];  Service: Vascular;  Laterality: Right;  . Removal of a dialysis catheter Right 07/05/2014    Procedure: REMOVAL OF A DIALYSIS CATHETER;  Surgeon: Conrad Outlook, MD;  Location: [facility];  Service: Vascular;  Laterality: Right;    Family History  Problem Relation Age of Onset  . Diabetes Father   . Heart attack Father     Social History:  reports that he quit smoking about 35 years ago. His smoking use included Cigarettes. He quit after 5 years of use. He has never used smokeless tobacco. He reports that he does not drink alcohol or use illicit drugs.  Allergies: No Known Allergies  Medications: I have reviewed the patient's current medications.  Results for orders placed or performed during the hospital encounter of 08/01/15 (from the past 48 hour(s))  CBG monitoring, ED     Status: Abnormal   Collection Time: 08/01/15  5:27 PM  Result Value Ref Range   Glucose-Capillary 228 (H) 65 - 99 mg/dL  Glucose, capillary     Status: Abnormal   Collection Time: 08/01/15  6:26 PM  Result Value Ref Range   Glucose-Capillary 199 (H) 65 - 99 mg/dL  MRSA PCR Screening     Status: None   Collection Time: 08/01/15  7:09 PM  Result Value Ref Range   MRSA by PCR NEGATIVE NEGATIVE    Comment:        The GeneXpert MRSA Assay (FDA approved for NASAL specimens only), is one component of a comprehensive MRSA colonization surveillance program. It is not intended to diagnose MRSA infection nor to guide or monitor treatment for MRSA infections.   Hepatitis B surface antigen     Status: None   Collection Time: 08/01/15 10:48 PM  Result Value Ref Range   Hepatitis B Surface Ag Negative Negative    Comment: (NOTE) Results called/faxed to Dedra Skeens @ 5:32am 11.19.16 Performed At: [facility] [address] [place], [place] [id number] Lindon Romp MD TW:[id number]   Glucose, capillary     Status: Abnormal   Collection Time: 08/02/15 12:26 AM  Result Value Ref Range   Glucose-Capillary 149 (H) 65 - 99 mg/dL  Glucose, capillary     Status: Abnormal   Collection Time: 08/02/15  2:32 AM  Result Value Ref Range   Glucose-Capillary 152 (H) 65 - 99 mg/dL  CBC     Status: Abnormal   Collection Time: 08/02/15  3:15 AM  Result Value Ref Range   WBC 7.8 4.0 - 10.5 K/uL   RBC 3.72 (L)  4.22 - 5.81 MIL/uL   Hemoglobin 10.9 (L) 13.0 - 17.0 g/dL   HCT 33.8 (L) 39.0 - 52.0 %   MCV 90.9 78.0 - 100.0 fL   MCH 29.3 26.0 - 34.0 pg   MCHC 32.2 30.0 - 36.0 g/dL   RDW 13.2 11.5 - 15.5 %   Platelets 175 150 - 400 K/uL  Basic metabolic panel     Status: Abnormal   Collection Time: 08/02/15  3:15 AM  Result Value Ref Range   Sodium 137 135 - 145 mmol/L   Potassium 3.8 3.5 - 5.1 mmol/L   Chloride 100 (L) 101 - 111 mmol/L   CO2 27 22 - 32 mmol/L   Glucose, Bld 168 (H) 65 - 99 mg/dL   BUN 17 6 - 20 mg/dL   Creatinine, Ser 4.77 (H) 0.61 - 1.24 mg/dL    Comment: DELTA CHECK NOTED   Calcium 7.5 (L) 8.9 - 10.3 mg/dL   GFR calc non Af Amer 11 (L) >60 mL/min   GFR calc Af Amer 13 (L) >60 mL/min     Comment: (NOTE) The eGFR has been calculated using the CKD EPI equation. This calculation has not been validated in all clinical situations. eGFR's persistently <60 mL/min signify possible Chronic Kidney Disease.    Anion gap 10 5 - 15  Glucose, capillary     Status: Abnormal   Collection Time: 08/02/15  4:44 AM  Result Value Ref Range   Glucose-Capillary 164 (H) 65 - 99 mg/dL  Glucose, capillary     Status: Abnormal   Collection Time: 08/02/15  5:43 AM  Result Value Ref Range   Glucose-Capillary 168 (H) 65 - 99 mg/dL  Glucose, capillary     Status: Abnormal   Collection Time: 08/02/15  8:29 AM  Result Value Ref Range   Glucose-Capillary 135 (H) 65 - 99 mg/dL  Glucose, capillary     Status: Abnormal   Collection Time: 08/02/15 10:32 AM  Result Value Ref Range   Glucose-Capillary 181 (H) 65 - 99 mg/dL  Glucose, capillary     Status: Abnormal   Collection Time: 08/02/15  3:55 PM  Result Value Ref Range   Glucose-Capillary 115 (H) 65 - 99 mg/dL  Glucose, capillary     Status: Abnormal   Collection Time: 08/02/15  9:17 PM  Result Value Ref Range   Glucose-Capillary 126 (H) 65 - 99 mg/dL  Glucose, capillary     Status: None   Collection Time: 08/03/15  7:34 AM  Result Value Ref Range   Glucose-Capillary 87 65 - 99 mg/dL  Glucose, capillary     Status: Abnormal   Collection Time: 08/03/15 11:13 AM  Result Value Ref Range   Glucose-Capillary 116 (H) 65 - 99 mg/dL  CBC     Status: Abnormal   Collection Time: 08/03/15  2:44 PM  Result Value Ref Range   WBC 5.5 4.0 - 10.5 K/uL   RBC 4.07 (L) 4.22 - 5.81 MIL/uL   Hemoglobin 11.6 (L) 13.0 - 17.0 g/dL   HCT 36.5 (L) 39.0 - 52.0 %   MCV 89.7 78.0 - 100.0 fL   MCH 28.5 26.0 - 34.0 pg   MCHC 31.8 30.0 - 36.0 g/dL   RDW 12.8 11.5 - 15.5 %   Platelets 168 150 - 400 K/uL  Renal function panel     Status: Abnormal   Collection Time: 08/03/15  2:44 PM  Result Value Ref Range   Sodium 133 (L) 135 - 145 mmol/L  Potassium 4.5 3.5 -  5.1 mmol/L   Chloride 97 (L) 101 - 111 mmol/L   CO2 27 22 - 32 mmol/L   Glucose, Bld 120 (H) 65 - 99 mg/dL   BUN 23 (H) 6 - 20 mg/dL   Creatinine, Ser 5.67 (H) 0.61 - 1.24 mg/dL   Calcium 7.9 (L) 8.9 - 10.3 mg/dL   Phosphorus 4.0 2.5 - 4.6 mg/dL   Albumin 3.1 (L) 3.5 - 5.0 g/dL   GFR calc non Af Amer 9 (L) >60 mL/min   GFR calc Af Amer 11 (L) >60 mL/min    Comment: (NOTE) The eGFR has been calculated using the CKD EPI equation. This calculation has not been validated in all clinical situations. eGFR's persistently <60 mL/min signify possible Chronic Kidney Disease.    Anion gap 9 5 - 15    No results found.  Review of Systems  Constitutional: Positive for malaise/fatigue. Negative for fever.  Eyes: Negative.   Cardiovascular: Negative.  Negative for chest pain.  Genitourinary:       ESRD  Musculoskeletal: Positive for falls.  Skin: Positive for rash.  Endo/Heme/Allergies:       IDDM  Psychiatric/Behavioral: Negative for depression and suicidal ideas.   Blood pressure 114/56, pulse 78, temperature 97.9 F (36.6 C), temperature source Oral, resp. rate 15, height _0  (1.727 m), weight 96.9 kg (213 lb 10 oz), SpO2 100 %. Physical Exam  Constitutional: He is oriented to person, place, and time. He appears well-developed and well-nourished. No distress.  HENT:  Head: Normocephalic.  Eyes: Conjunctivae are normal. Pupils are equal, round, and reactive to light.  Neck: Normal range of motion.  Cardiovascular: Normal rate.   Respiratory: Effort normal.  GI: Soft.  Musculoskeletal:  Right great toe callus , fluctuant at tip. Decrease sensation stocking distribution  Neurological: He is alert and oriented to person, place, and time.  Skin: No rash noted. He is not diaphoretic. No erythema.  Psychiatric: He has a normal mood and affect. His behavior is normal. Judgment and thought content normal.    Assessment/Plan: Right great toe osteomyelitis by xray with soft tissue  gas.   Discussed options with the patient. He understands that toe amputation may have healing problems. Option of more proximal amputation being needed.  Will proceed with toe amputation. Risks of nonhealing, need for more proximal amp discussed. All ?'s answered he understands and agrees to proceed.   YATES,MARK C 08/03/2015, 3:38 PM

## 2015-08-03 NOTE — Progress Notes (Signed)
Progress Note   Blake [name]:[id number] DOB: 1946/07/15 DOA: 08/01/2015 PCP: [name], MD   Brief Narrative:   Blake [name] is an 69 y.o. male with a PMH of end-stage renal disease on HD who was admitted 08/01/15 after being found down with confusion and slurred speech. Prior to this, he had a one-week history of vomiting. Blood sugars were 33. Patient also found to have right great toe osteomyelitis and will have amputation on 11/21  Assessment/Plan:   Principal Problem:   Hypoglycemia associated with diabetes ([place]) with metabolic encephalopathy - Placed on a 10% dextrose drip on admission. Hypoglycemia resolved - CBGs 120-200 average - Mental status back to baseline. - will continue SSI -patient tolerating diet    Hypoxia - Desaturates with activity.  CXR with interstitial edema and small effusions.  Suspect from volume overload from missing HD. -to be corrected with HD -advise not to missed treatments -denies SOB today (11/20)    Diabetes mellitus with renal complications -no further hypoglycemic events - Outpatient regimen: 25 units of Lantus daily, 4 units of NovoLog with meals along with SSI.  - continue SSI -most likely osteomyelitis causing trouble with sugar regulation     Hypertension -continuie amlodipine. -BP controlled    ESRD on dialysis ([place]) /  - HD orders per nephrology. - Resume phoslo and calcitriol.    Ulcer of toe of right foot ([name])/right great toe osteomyelitis - Continue empiric vancomycin/Zosyn. - ESR 35, CRP 1.1. - Discussed case with Dr. Lorin Mercy, plan is for amputation of right great toe on 08/04/15.    Hypothermia - Temperature is 95.1 on admission. Placed on a Bair hugger. - final Blood and urine cultures pending; but no signs of infection appreciated. - Continue broad-spectrum empiric antibiotics for now as coverage for right great toe osteomyelitis  -hypothermia most likely due to hypoglycemic event    Mechanical Fall - CT of the head negative for acute findings.  - Physical therapy evaluation ordered; will need to be repeated after amputation     DVT Prophylaxis - Heparin    Family Communication/Anticipated D/C date and plan/Code Status   Family Communication: No family at the bedside. Disposition Plan: Home when stable. Lives with his sister and niece. Will need home health PT. Anticipated D/C date:   Depends on orthopedic evaluation and time for surgery  Code Status: Full code.   IV Access:    AVF right upper arm  PIV   Procedures and diagnostic studies:   Ct Head Wo Contrast  08/01/2015  CLINICAL DATA:  Altered mental status. EXAM: CT HEAD WITHOUT CONTRAST TECHNIQUE: Contiguous axial images were obtained from the base of the skull through the vertex without intravenous contrast. COMPARISON:  None. FINDINGS: Mild motion related to patient's snoring. No intracranial hemorrhage, mass effect, or midline shift. No hydrocephalus. The basilar cisterns are patent. Moderate periventricular white matter change, most consistent chronic small vessel ischemia. No evidence of territorial infarct. No intracranial fluid collection. Calvarium is intact. Included paranasal sinuses and mastoid air cells are well aerated. IMPRESSION: 1. No acute intracranial abnormality allowing for patient motion. 2. Moderate periventricular white matter change, most commonly related to chronic small vessel ischemia. Electronically Signed   By: Jeb Levering M.D.   On: 08/01/2015 06:27   Dg Chest Port 1 View  08/01/2015  CLINICAL DATA:  Altered mental status. EXAM: PORTABLE CHEST 1 VIEW COMPARISON:  PA and lateral chest 07/10/2014. Single view of the chest 07/05/2014. FINDINGS: Left IJ  approach dialysis catheter present on the prior examinations has been removed. There is cardiomegaly and interstitial edema. Small bilateral pleural effusions are seen. IMPRESSION: Cardiomegaly and mild interstitial edema with  associated small effusions. Electronically Signed   By: Inge Rise M.D.   On: 08/01/2015 07:18   Dg Toe Great Right  08/01/2015  CLINICAL DATA:  Ulcer on posterior right great toe x 6 months. Hx of diabetes. EXAM: RIGHT GREAT TOE COMPARISON:  02/25/2010 FINDINGS: There is loss of the white cortical line along portions of the distal tuft of the great toe consistent with osteomyelitis. There is soft tissue air along the plantar margin irregularity of the dorsal distal soft tissues. No fracture. No other evidence of osteomyelitis. Joints are normally aligned. IMPRESSION: Osteomyelitis of the distal tuft of the right great toe with adjacent soft tissue air likely from the skin ulcer. Electronically Signed   By: Lajean Manes M.D.   On: 08/01/2015 14:20     Medical Consultants:    Nephrology- Dr Melvia Heaps  Orthopedic Surgery- Dr. Lorin Mercy  Anti-Infectives:   Anti-infectives    Start     Dose/Rate Route Frequency Ordered Stop   08/03/15 1400  vancomycin (VANCOCIN) IVPB 1000 mg/200 mL premix     1,000 mg 200 mL/hr over 60 Minutes Intravenous  Once 08/03/15 1349     08/02/15 0930  vancomycin (VANCOCIN) IVPB 1000 mg/200 mL premix     1,000 mg 200 mL/hr over 60 Minutes Intravenous  Once 08/02/15 0924 08/02/15 1130   08/01/15 1530  piperacillin-tazobactam (ZOSYN) IVPB 2.25 g     2.25 g 100 mL/hr over 30 Minutes Intravenous Every 8 hours 08/01/15 1450     08/01/15 1400  piperacillin-tazobactam (ZOSYN) IVPB 2.25 g  Status:  Discontinued     2.25 g 100 mL/hr over 30 Minutes Intravenous 3 times per day 08/01/15 0941 08/01/15 1450   08/01/15 0700  vancomycin (VANCOCIN) 2,000 mg in sodium chloride 0.9 % 500 mL IVPB     2,000 mg 250 mL/hr over 120 Minutes Intravenous  Once 08/01/15 0651 08/01/15 0908   08/01/15 0700  piperacillin-tazobactam (ZOSYN) IVPB 3.375 g     3.375 g 100 mL/hr over 30 Minutes Intravenous  Once 08/01/15 2595 08/01/15 0802      Subjective:   [name] has not had  any fever, chills, CP or SOB. Tolerating HD w/o difficulties. CBG's has remained stable and has not experienced further hypoglycemic event.  Objective:    Filed Vitals:   08/03/15 1430 08/03/15 1500 08/03/15 1528 08/03/15 1600  BP: 138/68 120/56 114/56 113/59  Pulse: 80 77 78 83  Temp:      TempSrc:      Resp:      Height:      Weight:      SpO2:        Intake/Output Summary (Last 24 hours) at 08/03/15 1630 Last data filed at 08/03/15 1258  Gross per 24 hour  Intake    720 ml  Output    100 ml  Net    620 ml   Filed Weights   08/02/15 1125 08/02/15 1354 08/03/15 1326  Weight: 98.5 kg (217 lb 2.5 oz) 97.5 kg (214 lb 15.2 oz) 96.9 kg (213 lb 10 oz)    Exam: Gen:  NAD, AAOX3, afebrile and denying CP or SOB Cardiovascular:  RRR, No M/R/G Respiratory:  Lungs CTAB Gastrointestinal:  Abdomen soft, NT/ND, + BS Extremities:  No C/E/C, right toe as pictured below:  Neuro: non focal deficit  Data Reviewed:    Labs: Basic Metabolic Panel:  Recent Labs Lab 08/01/15 0551 08/01/15 1353 08/02/15 0315 08/03/15 1444  NA 141 140 137 133*  K 3.3* 4.4 3.8 4.5  CL 103 101 100* 97*  CO2 _0 GLUCOSE 65 236* 168* 120*  BUN 54* 52* 17 23*  CREATININE 8.99* 8.72* 4.77* 5.67*  CALCIUM 7.4* 7.2* 7.5* 7.9*  PHOS  --  4.8*  --  4.0   GFR Estimated Creatinine Clearance: 14.3 mL/min (by C-G formula based on Cr of 5.67). Liver Function Tests:  Recent Labs Lab 08/01/15 0551 08/01/15 1353 08/03/15 1444  AST 29  --   --   ALT 21  --   --   ALKPHOS 84  --   --   BILITOT 0.5  --   --   PROT 6.4*  --   --   ALBUMIN 3.2* 2.9* 3.1*    Recent Labs Lab 08/01/15 0522  AMMONIA 27    CBC:  Recent Labs Lab 08/01/15 0551 08/01/15 1352 08/02/15 0315 08/03/15 1444  WBC 7.4 6.8 7.8 5.5  NEUTROABS 5.0  --   --   --   HGB 10.9* 11.1* 10.9* 11.6*  HCT 35.3* 34.6* 33.8* 36.5*  MCV 92.2 91.1 90.9 89.7  PLT 180 170 175 168   CBG:  Recent Labs Lab 08/02/15 1032  08/02/15 1555 08/02/15 2117 08/03/15 0734 08/03/15 1113  GLUCAP 181* 115* 126* 87 116*   Thyroid function studies:  Recent Labs  08/01/15 1352  TSH 0.642   Microbiology Recent Results (from the past 240 hour(s))  Blood culture (routine x 2)     Status: None (Preliminary result)   Collection Time: 08/01/15  6:03 AM  Result Value Ref Range Status   Specimen Description BLOOD LEFT ANTECUBITAL  Final   Special Requests BOTTLES DRAWN AEROBIC AND ANAEROBIC 5ML  Final   Culture NO GROWTH 2 DAYS  Final   Report Status PENDING  Incomplete  Blood culture (routine x 2)     Status: None (Preliminary result)   Collection Time: 08/01/15  6:10 AM  Result Value Ref Range Status   Specimen Description BLOOD HAND LEFT  Final   Special Requests BOTTLES DRAWN AEROBIC ONLY 2CC  Final   Culture NO GROWTH 2 DAYS  Final   Report Status PENDING  Incomplete  Urine culture     Status: None   Collection Time: 08/01/15  6:35 AM  Result Value Ref Range Status   Specimen Description URINE, CATHETERIZED  Final   Special Requests NONE  Final   Culture NO GROWTH 1 DAY  Final   Report Status 08/02/2015 FINAL  Final  MRSA PCR Screening     Status: None   Collection Time: 08/01/15  7:09 PM  Result Value Ref Range Status   MRSA by PCR NEGATIVE NEGATIVE Final    Comment:        The GeneXpert MRSA Assay (FDA approved for NASAL specimens only), is one component of a comprehensive MRSA colonization surveillance program. It is not intended to diagnose MRSA infection nor to guide or monitor treatment for MRSA infections.      Medications:   . amLODipine  10 mg Oral Daily  . antiseptic oral rinse  7 mL Mouth Rinse BID  . aspirin EC  81 mg Oral Daily  . atorvastatin  20 mg Oral Daily  . buPROPion  300 mg Oral Daily  . calcitRIOL  0.25 mcg Oral Daily  . calcium acetate  667 mg Oral TID WC  . doxercalciferol  4 mcg Intravenous Q T,Th,Sa-HD  . DULoxetine  20 mg Oral Daily  . feeding supplement  (NEPRO CARB STEADY)  237 mL Oral BID BM  . ferric gluconate (FERRLECIT/NULECIT) IV  62.5 mg Intravenous Q Sat-HD  . heparin  5,000 Units Subcutaneous 3 times per day  . insulin aspart  0-5 Units Subcutaneous QHS  . insulin aspart  0-9 Units Subcutaneous TID WC  . pantoprazole  40 mg Oral Daily  . piperacillin-tazobactam (ZOSYN)  IV  2.25 g Intravenous Q8H  . sevelamer carbonate  800 mg Oral TID WC  . vancomycin  1,000 mg Intravenous Once   Continuous Infusions:    Time spent: 35 minutes.      LOS: 2 days   Barton Dubois  [facility] Pager [phone]  08/03/2015, 4:30 PM

## 2015-08-03 NOTE — Consult Note (Signed)
WOC wound consult note Reason for Consult:Repeat consult for neuropathic toe ulcer. Seen on Friday, 08/01/15 by my partner, D. Engles.  No change in condition, no new recommendations.  Patient not seen today. [place] nursing team will not follow, but will remain available to this patient, the nursing and medical teams.  Please re-consult if needed. Thanks, Maudie Flakes, MSN, RN, [place], Arther Abbott  Pager# [phone]

## 2015-08-04 ENCOUNTER — Inpatient Hospital Stay (HOSPITAL_COMMUNITY): Payer: Medicare Other | Admitting: Certified Registered Nurse Anesthetist

## 2015-08-04 ENCOUNTER — Encounter (HOSPITAL_COMMUNITY)
Admission: EM | Disposition: A | Discharge status: Home / Self Care | Payer: Self-pay | Source: Home / Self Care | Attending: Internal Medicine

## 2015-08-04 ENCOUNTER — Encounter (HOSPITAL_COMMUNITY): Payer: Self-pay | Admitting: Anesthesiology

## 2015-08-04 HISTORY — PX: AMPUTATION TOE: SHX6595

## 2015-08-04 LAB — GLUCOSE, CAPILLARY
GLUCOSE-CAPILLARY: 69 mg/dL (ref 65–99)
GLUCOSE-CAPILLARY: 96 mg/dL (ref 65–99)
Glucose-Capillary: 170 mg/dL — ABNORMAL HIGH (ref 65–99)
Glucose-Capillary: 133 mg/dL — ABNORMAL HIGH (ref 65–99)
Glucose-Capillary: 86 mg/dL (ref 65–99)
Glucose-Capillary: 85 mg/dL (ref 65–99)
Glucose-Capillary: 108 mg/dL — ABNORMAL HIGH (ref 65–99)

## 2015-08-04 LAB — SURGICAL PCR SCREEN
MRSA, PCR: NEGATIVE
Staphylococcus aureus: NEGATIVE

## 2015-08-04 SURGERY — AMPUTATION, TOE
Anesthesia: Regional | Site: Foot | Laterality: Right

## 2015-08-04 MED ORDER — MIDAZOLAM HCL 2 MG/2ML IJ SOLN
INTRAMUSCULAR | Status: AC
  Filled 2015-08-04: qty 2

## 2015-08-04 MED ORDER — FENTANYL CITRATE (PF) 250 MCG/5ML IJ SOLN
INTRAMUSCULAR | Status: AC
  Filled 2015-08-04: qty 5

## 2015-08-04 MED ORDER — MIDAZOLAM HCL 2 MG/2ML IJ SOLN
INTRAMUSCULAR | Status: AC
  Administered 2015-08-04: 2 mg
  Filled 2015-08-04: qty 2

## 2015-08-04 MED ORDER — ACETAMINOPHEN 650 MG RE SUPP: 650.0000 mg | Freq: Four times a day (QID) | RECTAL | Status: DC | PRN

## 2015-08-04 MED ORDER — ACETAMINOPHEN 325 MG PO TABS: 650.0000 mg | ORAL_TABLET | Freq: Four times a day (QID) | ORAL | Status: DC | PRN

## 2015-08-04 MED ORDER — FENTANYL CITRATE (PF) 100 MCG/2ML IJ SOLN
100.0000 ug | Freq: Once | INTRAMUSCULAR | Status: AC
Start: 2015-08-04 — End: 2015-08-04
  Administered 2015-08-04: 100 ug via INTRAVENOUS

## 2015-08-04 MED ORDER — METOCLOPRAMIDE HCL 5 MG PO TABS: 5.0000 mg | ORAL_TABLET | Freq: Three times a day (TID) | ORAL | Status: DC | PRN

## 2015-08-04 MED ORDER — FENTANYL CITRATE (PF) 100 MCG/2ML IJ SOLN
INTRAMUSCULAR | Status: AC
  Administered 2015-08-04: 100 ug via INTRAVENOUS
  Filled 2015-08-04: qty 2

## 2015-08-04 MED ORDER — ONDANSETRON HCL 4 MG/2ML IJ SOLN: 4.0000 mg | Freq: Four times a day (QID) | INTRAMUSCULAR | Status: DC | PRN

## 2015-08-04 MED ORDER — DEXTROSE 50 % IV SOLN
INTRAVENOUS | Status: AC
Start: 2015-08-04 — End: 2015-08-04
  Administered 2015-08-04: 25 mL
  Filled 2015-08-04: qty 50

## 2015-08-04 MED ORDER — 0.9 % SODIUM CHLORIDE (POUR BTL) OPTIME
TOPICAL | Status: DC | PRN
  Administered 2015-08-04: 1000 mL

## 2015-08-04 MED ORDER — OXYCODONE HCL 5 MG PO TABS
5.0000 mg | ORAL_TABLET | ORAL | Status: DC | PRN
  Administered 2015-08-05: 10 mg via ORAL

## 2015-08-04 MED ORDER — MIDAZOLAM HCL 5 MG/ML IJ SOLN: 2.0000 mg | Freq: Once | INTRAMUSCULAR | Status: AC

## 2015-08-04 MED ORDER — HYDROMORPHONE HCL 1 MG/ML IJ SOLN
1.0000 mg | INTRAMUSCULAR | Status: DC | PRN
  Administered 2015-08-05: 1 mg via INTRAVENOUS

## 2015-08-04 MED ORDER — ONDANSETRON HCL 4 MG PO TABS: 4.0000 mg | ORAL_TABLET | Freq: Four times a day (QID) | ORAL | Status: DC | PRN

## 2015-08-04 MED ORDER — METOCLOPRAMIDE HCL 5 MG/ML IJ SOLN: 5.0000 mg | Freq: Three times a day (TID) | INTRAMUSCULAR | Status: DC | PRN

## 2015-08-04 MED ORDER — SODIUM CHLORIDE 0.9 % IV SOLN
Freq: Once | INTRAVENOUS | Status: AC
  Administered 2015-08-04: 16:00:00 via INTRAVENOUS

## 2015-08-04 MED ORDER — CEFAZOLIN SODIUM 1-5 GM-% IV SOLN: 1.0000 g | Freq: Three times a day (TID) | INTRAVENOUS | Status: DC

## 2015-08-04 MED ORDER — DOCUSATE SODIUM 100 MG PO CAPS
100.0000 mg | ORAL_CAPSULE | Freq: Two times a day (BID) | ORAL | Status: DC
  Administered 2015-08-04 – 2015-08-06 (×4): 100 mg via ORAL
  Filled 2015-08-04 (×4): qty 1

## 2015-08-04 MED ORDER — SODIUM CHLORIDE 0.9 % IV SOLN
INTRAVENOUS | Status: DC | PRN
  Administered 2015-08-04: 18:00:00 via INTRAVENOUS

## 2015-08-04 MED ORDER — BUPIVACAINE-EPINEPHRINE (PF) 0.5% -1:200000 IJ SOLN
INTRAMUSCULAR | Status: DC | PRN
  Administered 2015-08-04: 30 mL

## 2015-08-04 MED ORDER — POTASSIUM CHLORIDE IN NACL 20-0.45 MEQ/L-% IV SOLN
INTRAVENOUS | Status: DC
  Administered 2015-08-04: 20:00:00 via INTRAVENOUS
  Filled 2015-08-04 (×5): qty 1000

## 2015-08-04 MED ORDER — PROPOFOL 10 MG/ML IV BOLUS
INTRAVENOUS | Status: AC
  Filled 2015-08-04: qty 20

## 2015-08-04 MED ORDER — LIDOCAINE HCL (CARDIAC) 20 MG/ML IV SOLN
INTRAVENOUS | Status: AC
  Filled 2015-08-04: qty 10

## 2015-08-04 SURGICAL SUPPLY — 42 items
BANDAGE ELASTIC 6 VELCRO ST LF (GAUZE/BANDAGES/DRESSINGS) ×3 IMPLANT
BANDAGE ESMARK 6X9 LF (GAUZE/BANDAGES/DRESSINGS) IMPLANT
BLADE AVERAGE 25MMX9MM (BLADE)
BLADE AVERAGE 25X9 (BLADE) IMPLANT
BNDG COHESIVE 4X5 TAN STRL (GAUZE/BANDAGES/DRESSINGS) ×3 IMPLANT
BNDG ESMARK 4X9 LF (GAUZE/BANDAGES/DRESSINGS) ×3 IMPLANT
BNDG ESMARK 6X9 LF (GAUZE/BANDAGES/DRESSINGS)
BNDG GAUZE ELAST 4 BULKY (GAUZE/BANDAGES/DRESSINGS) ×3 IMPLANT
CUFF TOURNIQUET SINGLE 34IN LL (TOURNIQUET CUFF) IMPLANT
CUFF TOURNIQUET SINGLE 44IN (TOURNIQUET CUFF) IMPLANT
DRAPE U-SHAPE 47X51 STRL (DRAPES) ×6 IMPLANT
DRSG ADAPTIC 3X8 NADH LF (GAUZE/BANDAGES/DRESSINGS) IMPLANT
DURAPREP 26ML APPLICATOR (WOUND CARE) ×3 IMPLANT
ELECT REM PT RETURN 9FT ADLT (ELECTROSURGICAL) ×3
ELECTRODE REM PT RTRN 9FT ADLT (ELECTROSURGICAL) ×1 IMPLANT
GAUZE SPONGE 4X4 12PLY STRL (GAUZE/BANDAGES/DRESSINGS) ×3 IMPLANT
GAUZE XEROFORM 1X8 LF (GAUZE/BANDAGES/DRESSINGS) ×3 IMPLANT
GLOVE BIOGEL PI IND STRL 8 (GLOVE) ×1 IMPLANT
GLOVE BIOGEL PI INDICATOR 8 (GLOVE) ×2
GLOVE ORTHO TXT STRL SZ7.5 (GLOVE) ×3 IMPLANT
GOWN STRL REUS W/ TWL LRG LVL3 (GOWN DISPOSABLE) ×2 IMPLANT
GOWN STRL REUS W/TWL 2XL LVL3 (GOWN DISPOSABLE) ×3 IMPLANT
GOWN STRL REUS W/TWL LRG LVL3 (GOWN DISPOSABLE) ×4
KIT BASIN OR (CUSTOM PROCEDURE TRAY) ×3 IMPLANT
KIT ROOM TURNOVER OR (KITS) ×3 IMPLANT
MANIFOLD NEPTUNE II (INSTRUMENTS) IMPLANT
NS IRRIG 1000ML POUR BTL (IV SOLUTION) ×3 IMPLANT
PACK ORTHO EXTREMITY (CUSTOM PROCEDURE TRAY) ×3 IMPLANT
PAD ARMBOARD 7.5X6 YLW CONV (MISCELLANEOUS) ×6 IMPLANT
PAD CAST 4YDX4 CTTN HI CHSV (CAST SUPPLIES) ×1 IMPLANT
PADDING CAST COTTON 4X4 STRL (CAST SUPPLIES) ×2
SPONGE LAP 18X18 X RAY DECT (DISPOSABLE) ×3 IMPLANT
STAPLER VISISTAT 35W (STAPLE) IMPLANT
STOCKINETTE IMPERVIOUS LG (DRAPES) IMPLANT
SUCTION FRAZIER TIP 10 FR DISP (SUCTIONS) ×3 IMPLANT
SUT ETHILON 2 0 PSLX (SUTURE) IMPLANT
TOWEL OR 17X24 6PK STRL BLUE (TOWEL DISPOSABLE) ×3 IMPLANT
TOWEL OR 17X26 10 PK STRL BLUE (TOWEL DISPOSABLE) ×3 IMPLANT
TUBE CONNECTING 12'X1/4 (SUCTIONS)
TUBE CONNECTING 12X1/4 (SUCTIONS) IMPLANT
UNDERPAD 30X30 INCONTINENT (UNDERPADS AND DIAPERS) IMPLANT
WATER STERILE IRR 1000ML POUR (IV SOLUTION) IMPLANT

## 2015-08-04 NOTE — Progress Notes (Signed)
CBG 69 gave 1/2 amp Dextrose IV.  Sarah in [place] Stay notified.

## 2015-08-04 NOTE — Progress Notes (Signed)
PT Cancellation Note  Patient Details Name: Blake [name] MRN: [id number] DOB: 21-Aug-1946   Cancelled Treatment:    Reason Eval/Treat Not Completed: Other (comment) (Pt going to OR for toe amputation this morning.) PT to return as able and as appropriate on 11/22 post surgery.   Kingsley Callander 08/04/2015, 7:31 AM   Kittie Plater, PT, DPT Pager #: [phone] Office #: [phone]

## 2015-08-04 NOTE — Progress Notes (Signed)
CBG checked upon arrive to short stay, currently 82.

## 2015-08-04 NOTE — Progress Notes (Signed)
Shift progressed uneventfully. Patient slept well overnight and denied any pain.  Pulse oximetry readings remained consistently stable above > 92% on supplementary oxygen therapy via nasal cannula set at 1 LPM. There was no evidence of nocturnal desaturations to < 88 %.

## 2015-08-04 NOTE — Transfer of Care (Signed)
Immediate Anesthesia Transfer of Care Note  Patient: Blake [name]  Procedure(s) Performed: Procedure(s): RIGHT GREAT TOE AMPUTATION (Right)  Patient Location: PACU  Anesthesia Type:Regional  Level of Consciousness: awake, alert  and oriented  Airway & Oxygen Therapy: Patient connected to nasal cannula oxygen  Post-op Assessment: Report given to RN and Post -op Vital signs reviewed and stable  Post vital signs: Reviewed and stable  Last Vitals:  Filed Vitals:   08/04/15 1752 08/04/15 1753  BP:    Pulse: 69 68  Temp:    Resp: 22 13    Complications: No apparent anesthesia complications

## 2015-08-04 NOTE — Anesthesia Procedure Notes (Signed)
Anesthesia Regional Block:  Ankle block  Pre-Anesthetic Checklist: ,, timeout performed, Correct Patient, Correct Site, Correct Laterality, Correct Procedure, Correct Position, site marked, Risks and benefits discussed, Surgical consent,  Pre-op evaluation,  Post-op pain management  Laterality: Right  Prep: chloraprep       Needles:  Injection technique: Single-shot     Needle Length: 4cm 4 cm Needle Gauge: 22 and 22 G    Additional Needles: Ankle block Narrative:  Injection made incrementally with aspirations every 5 mL.  Performed by: Personally   Additional Notes: Tolerated well. Good blockade/anesthesia.

## 2015-08-04 NOTE — Progress Notes (Signed)
[facility] Progress Note  Assessment/Plan: 1. DM with hypoglycemia/hypothermia - Per primary BS now controlled. Normothermic. Resolved.  2. R great toe ischemia/ osteo- right great toe osteomyelitis. For amputation per Dr. Lorin Mercy today.  On Vanc and zosyn.  3. Hypoxemia /pulm edema/vol excess - had HD 08/03/15 off schedule. Net UF 3000. Post wt 94 kgs. Below OP EDW. Adjust upon DC. Will have HD on schedule tomorrow.  4. ESRD - TTS at [facility]. K+4.5 11/20.  5. HTN -good control on norvasc only 6. Anemia - Hgb 11.6 no ESA for now, Continue weekly Fe 7. Metabolic bone disease - Continue hectorol; on renvela 1 ac; hold sensipar for now due to vomiting issue and iPTH is 104. Ca 7.9 C Ca 8.62. Cont 2.5 Ca bath.  8. Nutrition - needs renal carb mod diet/vitamin. Albumin 3.1 NPO at present for surgery.   Rita H. Brown NP-C 08/04/2015, 9:39 AM  [facility] [phone]  Subjective:  "They're going to take my toe off today".  Denies NV, pain, SOB or discomfort. Watching TV.      Objective Filed Vitals:   08/03/15 1734 08/03/15 2038 08/04/15 0456 08/04/15 0843  BP: 119/57 121/53 164/59 134/60  Pulse: 75 82 81 68  Temp: 98 F (36.7 C) 98.9 F (37.2 C) 98 F (36.7 C) 98.2 F (36.8 C)  TempSrc: Oral Oral Oral Oral  Resp: 17 18 18 18   Height:      Weight:  94.1 kg (207 lb 7.3 oz)    SpO2: 99% 100% 100% 100%   Physical Exam General: Well nourished male NAD Heart: S1, S2, RRR.  Lungs: Bilateral breath sounds CTA Abdomen: soft, active BS Extremities: no LE edema Dialysis Access: RUA AVF + bruit  Dialysis Orders:  TueThuSat, 4 hrs 0 min, 180NRe Optiflux, BFR 400, DFR Manual 800 mL/min, EDW 97.5 (kg), Dialysate 2.0 K, 2.5 Ca,  UFR Profile: None, Sodium Model: None, Access: RUA AV Fistula. Heparin: 9000 units per treatment Hectoral: 4 mcg IV per treatment Venofer: 50 mg IV weekly     Additional Objective Labs: Basic Metabolic Panel:  Recent  Labs Lab 08/01/15 1353 08/02/15 0315 08/03/15 1444  NA 140 137 133*  K 4.4 3.8 4.5  CL 101 100* 97*  CO2 26 27 27   GLUCOSE 236* 168* 120*  BUN 52* 17 23*  CREATININE 8.72* 4.77* 5.67*  CALCIUM 7.2* 7.5* 7.9*  PHOS 4.8*  --  4.0   Liver Function Tests:  Recent Labs Lab 08/01/15 0551 08/01/15 1353 08/03/15 1444  AST 29  --   --   ALT 21  --   --   ALKPHOS 84  --   --   BILITOT 0.5  --   --   PROT 6.4*  --   --   ALBUMIN 3.2* 2.9* 3.1*   No results for input(s): LIPASE, AMYLASE in the last 168 hours. CBC:  Recent Labs Lab 08/01/15 0551 08/01/15 1352 08/02/15 0315 08/03/15 1444  WBC 7.4 6.8 7.8 5.5  NEUTROABS 5.0  --   --   --   HGB 10.9* 11.1* 10.9* 11.6*  HCT 35.3* 34.6* 33.8* 36.5*  MCV 92.2 91.1 90.9 89.7  PLT 180 170 175 168   Blood Culture    Component Value Date/Time   SDES URINE, CATHETERIZED 08/01/2015 0635   SPECREQUEST NONE 08/01/2015 0635   CULT NO GROWTH 1 DAY 08/01/2015 0635   REPTSTATUS 08/02/2015 FINAL 08/01/2015 0635    Cardiac Enzymes: No results for input(s):  CKTOTAL, CKMB, CKMBINDEX, TROPONINI in the last 168 hours. CBG:  Recent Labs Lab 08/03/15 0734 08/03/15 1113 08/03/15 1731 08/03/15 2036 08/04/15 0802  GLUCAP 87 116* 107* 191* 133*   Iron Studies: No results for input(s): IRON, TIBC, TRANSFERRIN, FERRITIN in the last 72 hours. @lablastinr3 @ Studies/Results: No results found. Medications:   . amLODipine  10 mg Oral Daily  . antiseptic oral rinse  7 mL Mouth Rinse BID  . aspirin EC  81 mg Oral Daily  . atorvastatin  20 mg Oral Daily  . buPROPion  300 mg Oral Daily  . calcitRIOL  0.25 mcg Oral Daily  . calcium acetate  667 mg Oral TID WC  . doxercalciferol  4 mcg Intravenous Q T,Th,Sa-HD  . DULoxetine  20 mg Oral Daily  . feeding supplement (NEPRO CARB STEADY)  237 mL Oral BID BM  . ferric gluconate (FERRLECIT/NULECIT) IV  62.5 mg Intravenous Q Sat-HD  . heparin  5,000 Units Subcutaneous 3 times per day  . insulin  aspart  0-5 Units Subcutaneous QHS  . insulin aspart  0-9 Units Subcutaneous TID WC  . pantoprazole  40 mg Oral Daily  . piperacillin-tazobactam (ZOSYN)  IV  2.25 g Intravenous Q8H  . sevelamer carbonate  800 mg Oral TID WC    I have seen and examined this patient and agree with plan as outlined by Juanell Fairly, NP.  Mr. Safko is awaiting amputation of his right great toe due to osteomyelitis.  Feels well and denies pain.  Anxious for the procedure and hopeful discharge soon after.  Will cont with HD while an inpatient.  On TTS schedule at [facility] so is on TuesFriSun holiday schedule.  Plan for HD tomorrow and hopeful discharge soon afterward. Jonell Brumbaugh A,MD 08/04/2015 2:33 PM

## 2015-08-04 NOTE — Care Management Important Message (Signed)
Important Message  Patient Details  Name: Blake [name] MRN: [id number] Date of Birth: 05-Jul-1946   Medicare Important Message Given:  Yes    [name] 08/04/2015, 3:49 PM

## 2015-08-04 NOTE — Anesthesia Preprocedure Evaluation (Addendum)
Anesthesia Evaluation  Patient identified by MRN, date of birth, ID band Patient awake    Reviewed: Allergy & Precautions, NPO status , Patient's Chart, lab work & pertinent test results  Airway Mallampati: II  TM Distance: >3 FB Neck ROM: Full    Dental no notable dental hx.    Pulmonary former smoker,    Pulmonary exam normal breath sounds clear to auscultation       Cardiovascular hypertension, Pt. on medications Normal cardiovascular exam Rhythm:Regular Rate:Normal     Neuro/Psych PSYCHIATRIC DISORDERS Depression negative neurological ROS     GI/Hepatic GERD  Medicated,  Endo/Other  diabetes, Type 2, Insulin Dependent  Renal/GU CRFRenal disease  negative genitourinary   Musculoskeletal negative musculoskeletal ROS (+)   Abdominal   Peds negative pediatric ROS (+)  Hematology negative hematology ROS (+)   Anesthesia Other Findings   Reproductive/Obstetrics negative OB ROS                          Lab Results  Component Value Date   WBC 5.5 08/03/2015   HGB 11.6* 08/03/2015   HCT 36.5* 08/03/2015   MCV 89.7 08/03/2015   PLT 168 08/03/2015   Lab Results  Component Value Date   CREATININE 5.67* 08/03/2015   BUN 23* 08/03/2015   NA 133* 08/03/2015   K 4.5 08/03/2015   CL 97* 08/03/2015   CO2 27 08/03/2015   Lab Results  Component Value Date   INR 1.33 07/02/2014   EKG: NSR.   Anesthesia Physical Anesthesia Plan  ASA: III  Anesthesia Plan: Regional   Post-op Pain Management:    Induction:   Airway Management Planned:   Additional Equipment:   Intra-op Plan:   Post-operative Plan:   Informed Consent: I have reviewed the patients History and Physical, chart, labs and discussed the procedure including the risks, benefits and alternatives for the proposed anesthesia with the patient or authorized representative who has indicated his/her understanding and  acceptance.   Dental advisory given  Plan Discussed with: CRNA  Anesthesia Plan Comments:        Anesthesia Quick Evaluation

## 2015-08-04 NOTE — Progress Notes (Signed)
Progress Note   Blake [name]:[id number] DOB: 08-07-1946 DOA: 08/01/2015 PCP: [name], MD   Brief Narrative:   Blake [name] is an 69 y.o. male with a PMH of end-stage renal disease on HD who was admitted 08/01/15 after being found down with confusion and slurred speech. Prior to this, he had a one-week history of vomiting. Blood sugars were 33. Patient also found to have right great toe osteomyelitis and will have amputation later today 11/21  Assessment/Plan:   Principal Problem:   Hypoglycemia associated with diabetes ([place]) with metabolic encephalopathy - Placed on a 10% dextrose drip on admission. Hypoglycemia resolved - CBGs 86-130 while NPO for surgery  - Mental status back to baseline. - will continue SSI -patient tolerating diet    Hypoxia - Desaturates with activity.  CXR with interstitial edema and small effusions.  Suspect from volume overload from missing HD. -to be corrected with HD -advise not to missed treatments -denies SOB today (11/20)    Diabetes mellitus with renal complications -no further hypoglycemic events - Outpatient regimen: 25 units of Lantus daily, 4 units of NovoLog with meals along with SSI.  - continue SSI and CBG's check -most likely osteomyelitis causing trouble with sugar regulation     Hypertension -continuie amlodipine. -BP controlled    ESRD on dialysis ([place]) /  - HD orders per nephrology. - Resume phoslo and calcitriol.    Ulcer of toe of right foot ([name])/right great toe osteomyelitis - Continue empiric vancomycin/Zosyn. - ESR 35, CRP 1.1. - Discussed case with Dr. Lorin Mercy, plan is for amputation of right great toe later today 08/04/15.    Hypothermia - Temperature is 95.1 on admission. Placed on a Bair hugger. - final Blood and urine cultures pending; but no signs of infection appreciated. - Continue broad-spectrum empiric antibiotics for now as coverage for right great toe osteomyelitis    -hypothermia most likely due to hypoglycemic event     Mechanical Fall - CT of the head negative for acute findings.  - Physical therapy evaluation ordered; will need to be repeated after amputation     DVT Prophylaxis - Heparin    Family Communication/Anticipated D/C date and plan/Code Status   Family Communication: No family at the bedside. Disposition Plan: Home when stable. Lives with his sister and niece. Will need home health PT. Anticipated D/C date:   Depends on orthopedic evaluation and time for surgery  Code Status: Full code.   IV Access:    AVF right upper arm  PIV   Procedures and diagnostic studies:   Ct Head Wo Contrast  08/01/2015  CLINICAL DATA:  Altered mental status. EXAM: CT HEAD WITHOUT CONTRAST TECHNIQUE: Contiguous axial images were obtained from the base of the skull through the vertex without intravenous contrast. COMPARISON:  None. FINDINGS: Mild motion related to patient's snoring. No intracranial hemorrhage, mass effect, or midline shift. No hydrocephalus. The basilar cisterns are patent. Moderate periventricular white matter change, most consistent chronic small vessel ischemia. No evidence of territorial infarct. No intracranial fluid collection. Calvarium is intact. Included paranasal sinuses and mastoid air cells are well aerated. IMPRESSION: 1. No acute intracranial abnormality allowing for patient motion. 2. Moderate periventricular white matter change, most commonly related to chronic small vessel ischemia. Electronically Signed   By: Jeb Levering M.D.   On: 08/01/2015 06:27   Dg Chest Port 1 View  08/01/2015  CLINICAL DATA:  Altered mental status. EXAM: PORTABLE CHEST 1 VIEW COMPARISON:  PA and  lateral chest 07/10/2014. Single view of the chest 07/05/2014. FINDINGS: Left IJ approach dialysis catheter present on the prior examinations has been removed. There is cardiomegaly and interstitial edema. Small bilateral pleural effusions are seen.  IMPRESSION: Cardiomegaly and mild interstitial edema with associated small effusions. Electronically Signed   By: Inge Rise M.D.   On: 08/01/2015 07:18   Dg Toe Great Right  08/01/2015  CLINICAL DATA:  Ulcer on posterior right great toe x 6 months. Hx of diabetes. EXAM: RIGHT GREAT TOE COMPARISON:  02/25/2010 FINDINGS: There is loss of the white cortical line along portions of the distal tuft of the great toe consistent with osteomyelitis. There is soft tissue air along the plantar margin irregularity of the dorsal distal soft tissues. No fracture. No other evidence of osteomyelitis. Joints are normally aligned. IMPRESSION: Osteomyelitis of the distal tuft of the right great toe with adjacent soft tissue air likely from the skin ulcer. Electronically Signed   By: Lajean Manes M.D.   On: 08/01/2015 14:20     Medical Consultants:    Nephrology- Dr Melvia Heaps  Orthopedic Surgery- Dr. Lorin Mercy  Anti-Infectives:   Anti-infectives    Start     Dose/Rate Route Frequency Ordered Stop   08/03/15 1400  vancomycin (VANCOCIN) IVPB 1000 mg/200 mL premix     1,000 mg 200 mL/hr over 60 Minutes Intravenous  Once 08/03/15 1349 08/03/15 1655   08/02/15 0930  vancomycin (VANCOCIN) IVPB 1000 mg/200 mL premix     1,000 mg 200 mL/hr over 60 Minutes Intravenous  Once 08/02/15 0924 08/02/15 1130   08/01/15 1530  piperacillin-tazobactam (ZOSYN) IVPB 2.25 g     2.25 g 100 mL/hr over 30 Minutes Intravenous Every 8 hours 08/01/15 1450     08/01/15 1400  piperacillin-tazobactam (ZOSYN) IVPB 2.25 g  Status:  Discontinued     2.25 g 100 mL/hr over 30 Minutes Intravenous 3 times per day 08/01/15 0941 08/01/15 1450   08/01/15 0700  vancomycin (VANCOCIN) 2,000 mg in sodium chloride 0.9 % 500 mL IVPB     2,000 mg 250 mL/hr over 120 Minutes Intravenous  Once 08/01/15 0651 08/01/15 0908   08/01/15 0700  piperacillin-tazobactam (ZOSYN) IVPB 3.375 g     3.375 g 100 mL/hr over 30 Minutes Intravenous  Once 08/01/15 2334  08/01/15 0802      Subjective:   Blake [name] has not had any fever, chills, CP or SOB. Ready and in agreement with surgery today  Objective:    Filed Vitals:   08/03/15 1734 08/03/15 2038 08/04/15 0456 08/04/15 0843  BP: 119/57 121/53 164/59 134/60  Pulse: 75 82 81 68  Temp: 98 F (36.7 C) 98.9 F (37.2 C) 98 F (36.7 C) 98.2 F (36.8 C)  TempSrc: Oral Oral Oral Oral  Resp: '17 18 18 18  ' Height:      Weight:  94.1 kg (207 lb 7.3 oz)    SpO2: 99% 100% 100% 100%    Intake/Output Summary (Last 24 hours) at 08/04/15 1501 Last data filed at 08/04/15 1315  Gross per 24 hour  Intake    720 ml  Output   3000 ml  Net  -2280 ml   Filed Weights   08/03/15 1326 08/03/15 1708 08/03/15 2038  Weight: 96.9 kg (213 lb 10 oz) 94 kg (207 lb 3.7 oz) 94.1 kg (207 lb 7.3 oz)    Exam: Gen:  NAD, AAOX3, afebrile and denying CP or SOB. Ready for surgery today Cardiovascular:  RRR, No  M/R/G Respiratory:  Lungs CTAB Gastrointestinal:  Abdomen soft, NT/ND, + BS Extremities:  No C/E/C, right toe as pictured below:   Neuro: non focal deficit  Data Reviewed:    Labs: Basic Metabolic Panel:  Recent Labs Lab 08/01/15 0551 08/01/15 1353 08/02/15 0315 08/03/15 1444  NA 141 140 137 133*  K 3.3* 4.4 3.8 4.5  CL 103 101 100* 97*  CO2 '28 26 27 27  ' GLUCOSE 65 236* 168* 120*  BUN 54* 52* 17 23*  CREATININE 8.99* 8.72* 4.77* 5.67*  CALCIUM 7.4* 7.2* 7.5* 7.9*  PHOS  --  4.8*  --  4.0   Liver Function Tests:  Recent Labs Lab 08/01/15 0551 08/01/15 1353 08/03/15 1444  AST 29  --   --   ALT 21  --   --   ALKPHOS 84  --   --   BILITOT 0.5  --   --   PROT 6.4*  --   --   ALBUMIN 3.2* 2.9* 3.1*    Recent Labs Lab 08/01/15 0522  AMMONIA 27   CBC:  Recent Labs Lab 08/01/15 0551 08/01/15 1352 08/02/15 0315 08/03/15 1444  WBC 7.4 6.8 7.8 5.5  NEUTROABS 5.0  --   --   --   HGB 10.9* 11.1* 10.9* 11.6*  HCT 35.3* 34.6* 33.8* 36.5*  MCV 92.2 91.1 90.9 89.7  PLT  180 170 175 168   CBG:  Recent Labs Lab 08/03/15 1113 08/03/15 1731 08/03/15 2036 08/04/15 0802 08/04/15 1121  GLUCAP 116* 107* 191* 133* 55    Microbiology Recent Results (from the past 240 hour(s))  Blood culture (routine x 2)     Status: None (Preliminary result)   Collection Time: 08/01/15  6:03 AM  Result Value Ref Range Status   Specimen Description BLOOD LEFT ANTECUBITAL  Final   Special Requests BOTTLES DRAWN AEROBIC AND ANAEROBIC 5ML  Final   Culture NO GROWTH 3 DAYS  Final   Report Status PENDING  Incomplete  Blood culture (routine x 2)     Status: None (Preliminary result)   Collection Time: 08/01/15  6:10 AM  Result Value Ref Range Status   Specimen Description BLOOD HAND LEFT  Final   Special Requests BOTTLES DRAWN AEROBIC ONLY 2CC  Final   Culture NO GROWTH 3 DAYS  Final   Report Status PENDING  Incomplete  Urine culture     Status: None   Collection Time: 08/01/15  6:35 AM  Result Value Ref Range Status   Specimen Description URINE, CATHETERIZED  Final   Special Requests NONE  Final   Culture NO GROWTH 1 DAY  Final   Report Status 08/02/2015 FINAL  Final  MRSA PCR Screening     Status: None   Collection Time: 08/01/15  7:09 PM  Result Value Ref Range Status   MRSA by PCR NEGATIVE NEGATIVE Final    Comment:        The GeneXpert MRSA Assay (FDA approved for NASAL specimens only), is one component of a comprehensive MRSA colonization surveillance program. It is not intended to diagnose MRSA infection nor to guide or monitor treatment for MRSA infections.   Surgical pcr screen     Status: None   Collection Time: 08/04/15  7:40 AM  Result Value Ref Range Status   MRSA, PCR NEGATIVE NEGATIVE Final   Staphylococcus aureus NEGATIVE NEGATIVE Final    Comment:        The Xpert SA Assay (FDA approved for NASAL specimens in  patients over 81 years of age), is one component of a comprehensive surveillance program.  Test performance has been validated  by [facility] for patients greater than or equal to 85 year old. It is not intended to diagnose infection nor to guide or monitor treatment.      Medications:   . amLODipine  10 mg Oral Daily  . antiseptic oral rinse  7 mL Mouth Rinse BID  . aspirin EC  81 mg Oral Daily  . atorvastatin  20 mg Oral Daily  . buPROPion  300 mg Oral Daily  . calcitRIOL  0.25 mcg Oral Daily  . calcium acetate  667 mg Oral TID WC  . doxercalciferol  4 mcg Intravenous Q T,Th,Sa-HD  . DULoxetine  20 mg Oral Daily  . feeding supplement (NEPRO CARB STEADY)  237 mL Oral BID BM  . ferric gluconate (FERRLECIT/NULECIT) IV  62.5 mg Intravenous Q Sat-HD  . heparin  5,000 Units Subcutaneous 3 times per day  . insulin aspart  0-5 Units Subcutaneous QHS  . insulin aspart  0-9 Units Subcutaneous TID WC  . pantoprazole  40 mg Oral Daily  . piperacillin-tazobactam (ZOSYN)  IV  2.25 g Intravenous Q8H  . sevelamer carbonate  800 mg Oral TID WC   Continuous Infusions:    Time spent: 35 minutes.      LOS: 3 days   Barton Dubois  [facility] Pager [phone]  08/04/2015, 3:01 PM

## 2015-08-04 NOTE — Brief Op Note (Signed)
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  PRE-OPERATIVE DIAGNOSIS:  RIGHT GREAT TOE OSTEOMYELITIS  POST-OPERATIVE DIAGNOSIS:  RIGHT GREAT TOE OSTEOMYELITIS  PROCEDURE:  Procedure(s): RIGHT GREAT TOE AMPUTATION (Right)  SURGEON:  Surgeon(s) and Role:    * [name], MD - Primary  PHYSICIAN ASSISTANT:   ASSISTANTS: none   ANESTHESIA:   regional  EBL:     BLOOD ADMINISTERED:none  DRAINS: none   LOCAL MEDICATIONS USED:  NONE  SPECIMEN:  No Specimen  DISPOSITION OF SPECIMEN:  N/A  COUNTS:  YES  TOURNIQUET:  * No tourniquets in log *  DICTATION: .Dragon Dictation  PLAN OF CARE: already inpatient  PATIENT DISPOSITION:  PACU - hemodynamically stable.   Delay start of Pharmacological VTE agent (>24hrs) due to surgical blood loss or risk of bleeding: yes

## 2015-08-04 NOTE — Anesthesia Postprocedure Evaluation (Signed)
Anesthesia Post Note  Patient: Blake [name]  Procedure(s) Performed: Procedure(s) (LRB): RIGHT GREAT TOE AMPUTATION (Right)  Patient location during evaluation: PACU Anesthesia Type: MAC and Regional Level of consciousness: awake and alert Pain management: pain level controlled Vital Signs Assessment: post-procedure vital signs reviewed and stable Respiratory status: spontaneous breathing Cardiovascular status: blood pressure returned to baseline Postop Assessment: No signs of nausea or vomiting Anesthetic complications: no    Last Vitals:  Filed Vitals:   08/04/15 1930 08/04/15 2009  BP: 152/66 147/58  Pulse: 71 85  Temp:  36.9 C  Resp: 13 18    Last Pain:  Filed Vitals:   08/04/15 2010  PainSc: 0-No pain    LLE Motor Response: Purposeful movement, Responds to commands LLE Sensation: Full sensation RLE Motor Response: Purposeful movement, Responds to commands RLE Sensation: Full sensation      Tiajuana Amass

## 2015-08-04 NOTE — Progress Notes (Signed)
Report called to Judson Roch in [employer]  All questions answered.

## 2015-08-04 NOTE — Progress Notes (Signed)
Hypoglycemic Event  CBG: 69  Treatment: 1/2 amp dextrose IV  Symptoms: none  Follow-up CBG: Time: pt in OR CBG Result:  Possible Reasons for Event: NPO for surgery  Comments/MD notified: yes    Lezlie Octave

## 2015-08-05 ENCOUNTER — Encounter (HOSPITAL_COMMUNITY): Payer: Self-pay | Admitting: Orthopaedic Surgery

## 2015-08-05 LAB — CBC
HCT: 36.7 % — ABNORMAL LOW (ref 39.0–52.0)
Hemoglobin: 11.8 g/dL — ABNORMAL LOW (ref 13.0–17.0)
MCH: 29 pg (ref 26.0–34.0)
MCHC: 32.2 g/dL (ref 30.0–36.0)
MCV: 90.2 fL (ref 78.0–100.0)
Platelets: 172 K/uL (ref 150–400)
RBC: 4.07 MIL/uL — ABNORMAL LOW (ref 4.22–5.81)
RDW: 13.1 % (ref 11.5–15.5)
WBC: 6.7 K/uL (ref 4.0–10.5)

## 2015-08-05 LAB — BASIC METABOLIC PANEL
Anion gap: 9 (ref 5–15)
BUN: 37 mg/dL — AB (ref 6–20)
CALCIUM: 8 mg/dL — AB (ref 8.9–10.3)
CO2: 29 mmol/L (ref 22–32)
CREATININE: 6.98 mg/dL — AB (ref 0.61–1.24)
Chloride: 101 mmol/L (ref 101–111)
GFR, EST AFRICAN AMERICAN: 8 mL/min — AB (ref >60)
GFR, EST NON AFRICAN AMERICAN: 7 mL/min — AB (ref >60)
Glucose, Bld: 151 mg/dL — ABNORMAL HIGH (ref 65–99)
Potassium: 4.8 mmol/L (ref 3.5–5.1)
SODIUM: 139 mmol/L (ref 135–145)

## 2015-08-05 LAB — VANCOMYCIN, RANDOM: VANCOMYCIN RM: 19 ug/mL

## 2015-08-05 LAB — GLUCOSE, CAPILLARY
GLUCOSE-CAPILLARY: 85 mg/dL (ref 65–99)
Glucose-Capillary: 82 mg/dL (ref 65–99)
Glucose-Capillary: 107 mg/dL — ABNORMAL HIGH (ref 65–99)
Glucose-Capillary: 216 mg/dL — ABNORMAL HIGH (ref 65–99)
Glucose-Capillary: 142 mg/dL — ABNORMAL HIGH (ref 65–99)

## 2015-08-05 LAB — PREALBUMIN: Prealbumin: 25.9 mg/dL (ref 18–38)

## 2015-08-05 LAB — PHOSPHORUS: Phosphorus: 3.9 mg/dL (ref 2.5–4.6)

## 2015-08-05 MED ORDER — OXYCODONE HCL 5 MG PO TABS
ORAL_TABLET | ORAL | Status: AC
  Filled 2015-08-05: qty 2

## 2015-08-05 MED ORDER — VANCOMYCIN HCL IN DEXTROSE 1-5 GM/200ML-% IV SOLN
1000.0000 mg | Freq: Once | INTRAVENOUS | Status: AC
  Administered 2015-08-05: 1000 mg via INTRAVENOUS
  Filled 2015-08-05: qty 200

## 2015-08-05 MED ORDER — ALTEPLASE 2 MG IJ SOLR
2.0000 mg | Freq: Once | INTRAMUSCULAR | Status: DC | PRN
Start: 2015-08-05 — End: 2015-08-05
  Filled 2015-08-05: qty 2

## 2015-08-05 MED ORDER — DOXERCALCIFEROL 4 MCG/2ML IV SOLN
INTRAVENOUS | Status: AC
  Filled 2015-08-05: qty 2

## 2015-08-05 MED ORDER — LIDOCAINE HCL (PF) 1 % IJ SOLN: 5.0000 mL | INTRAMUSCULAR | Status: DC | PRN

## 2015-08-05 MED ORDER — LIDOCAINE-PRILOCAINE 2.5-2.5 % EX CREA: 1.0000 "application " | TOPICAL_CREAM | CUTANEOUS | Status: DC | PRN

## 2015-08-05 MED ORDER — SODIUM CHLORIDE 0.9 % IV SOLN: 100.0000 mL | INTRAVENOUS | Status: DC | PRN

## 2015-08-05 MED ORDER — HEPARIN SODIUM (PORCINE) 1000 UNIT/ML DIALYSIS: 1000.0000 [IU] | INTRAMUSCULAR | Status: DC | PRN

## 2015-08-05 MED ORDER — PENTAFLUOROPROP-TETRAFLUOROETH EX AERO: 1.0000 "application " | INHALATION_SPRAY | CUTANEOUS | Status: DC | PRN

## 2015-08-05 MED ORDER — HYDROMORPHONE HCL 1 MG/ML IJ SOLN
INTRAMUSCULAR | Status: AC
  Filled 2015-08-05: qty 1

## 2015-08-05 NOTE — Progress Notes (Signed)
Patient called out to nursing station to state that his foot was starting to bleed a little- RN went into room and notice a small amount of blood starting to peak through at the top of the surgical foot ace wrap. Foot will be will reinforced with gauze at this time by patient's nurse. Will pass on to day RN.

## 2015-08-05 NOTE — Progress Notes (Signed)
Physical Therapy Treatment Patient Details Name: Blake [name] MRN: [id number] DOB: 1945/10/08 Today's Date: 08/05/2015    History of Present Illness Blake [name] is an 69 y.o. male with a PMH of end-stage renal disease on HD who was admitted 08/01/15 after being found down with confusion and slurred speech. Prior to this, he had a one-week history of vomiting. Blood sugars were 33. Pt s/p R toe amputation 11/21.    PT Comments    Pt now with R toe amputation however denies pain and is tolerating ambulation with RW and R darco shoe well. Pt questioned how he was going to get to dialysis since he drives. Advised pt to ask MD if he was allowed to drive. Acute PT to con't to follow to improve ambulation indep and stair negotiation.  Follow Up Recommendations  Home health PT;Supervision/Assistance - 24 hour     Equipment Recommendations  Rolling walker with 5" wheels    Recommendations for Other Services       Precautions / Restrictions Precautions Precautions: Fall Required Braces or Orthoses: Other Brace/Splint Other Brace/Splint: darco shoe - off weights for foot Restrictions Weight Bearing Restrictions: No    Mobility  Bed Mobility Overal bed mobility: Independent                Transfers Overall transfer level: Needs assistance Equipment used: Rolling walker (2 wheeled) Transfers: Sit to/from Stand Sit to Stand: Min guard         General transfer comment: cues for hand placement needed  Ambulation/Gait Ambulation/Gait assistance: Min guard Ambulation Distance (Feet): 60 Feet Assistive device: Rolling walker (2 wheeled) Gait Pattern/deviations: Step-to pattern Gait velocity: decreased   General Gait Details: pt guarded due to getting used to ambulating with darco shoe. recommended pt wear a shoe on L foot to allow for even  leg length.   Stairs            Wheelchair Mobility    Modified Rankin (Stroke Patients Only)        Balance Overall balance assessment: Needs assistance         Standing balance support: Bilateral upper extremity supported Standing balance-Leahy Scale: Poor Standing balance comment: need RW for safe ambulation                    Cognition Arousal/Alertness: Awake/alert Behavior During Therapy: WFL for tasks assessed/performed Overall Cognitive Status: Within Functional Limits for tasks assessed                      Exercises      General Comments General comments (skin integrity, edema, etc.): educated pt on elevating R LE for edema management and how to don/doff darco shoe      Pertinent Vitals/Pain Pain Assessment: No/denies pain    Home Living                      Prior Function            PT Goals (current goals can now be found in the care plan section) Progress towards PT goals: Progressing toward goals    Frequency  Min 3X/week    PT Plan Current plan remains appropriate    Co-evaluation             End of Session Equipment Utilized During Treatment: Gait belt Activity Tolerance: Patient tolerated treatment well Patient left: in bed;with call bell/phone within reach     Time:  [id number] PT Time Calculation (min) (ACUTE ONLY): 31 min  Charges:  $Gait Training: 8-22 mins $Therapeutic Activity: 8-22 mins                    G Codes:      Kingsley Callander 08/05/2015, 3:20 PM   Kittie Plater, PT, DPT Pager #: [phone] Office #: [phone]

## 2015-08-05 NOTE — Progress Notes (Signed)
ANTIBIOTIC CONSULT NOTE - FOLLOW UP  Pharmacy Consult for Vancomycin and Zosyn Indication: R great toe osteo  No Known Allergies  Patient Measurements: Height: 5\' 8"  (172.7 cm) Weight: 207 lb 0.2 oz (93.9 kg) IBW/kg (Calculated) : 68.4  Vital Signs: Temp: 98.1 F (36.7 C) (11/22 1225) Temp Source: Oral (11/22 1225) BP: 149/70 mmHg (11/22 1225) Pulse Rate: 76 (11/22 1225) Intake/Output from previous day: 11/21 0701 - 11/22 0700 In: 760 [P.O.:660; I.V.:100] Out: 0  Intake/Output from this shift: Total I/O In: 0  Out: 1500 [Other:1500]  Labs:  Recent Labs  08/03/15 1444 08/05/15 0407 08/05/15 0630  WBC 5.5  --  6.7  HGB 11.6*  --  11.8*  PLT 168  --  172  CREATININE 5.67* 6.98*  --    Estimated Creatinine Clearance: 11.4 mL/min (by C-G formula based on Cr of 6.98).  Recent Labs  08/05/15 0900  VANCORANDOM 19     Microbiology: Recent Results (from the past 720 hour(s))  Blood culture (routine x 2)     Status: None (Preliminary result)   Collection Time: 08/01/15  6:03 AM  Result Value Ref Range Status   Specimen Description BLOOD LEFT ANTECUBITAL  Final   Special Requests BOTTLES DRAWN AEROBIC AND ANAEROBIC 5ML  Final   Culture NO GROWTH 4 DAYS  Final   Report Status PENDING  Incomplete  Blood culture (routine x 2)     Status: None (Preliminary result)   Collection Time: 08/01/15  6:10 AM  Result Value Ref Range Status   Specimen Description BLOOD HAND LEFT  Final   Special Requests BOTTLES DRAWN AEROBIC ONLY 2CC  Final   Culture NO GROWTH 4 DAYS  Final   Report Status PENDING  Incomplete  Urine culture     Status: None   Collection Time: 08/01/15  6:35 AM  Result Value Ref Range Status   Specimen Description URINE, CATHETERIZED  Final   Special Requests NONE  Final   Culture NO GROWTH 1 DAY  Final   Report Status 08/02/2015 FINAL  Final  MRSA PCR Screening     Status: None   Collection Time: 08/01/15  7:09 PM  Result Value Ref Range Status   MRSA  by PCR NEGATIVE NEGATIVE Final    Comment:        The GeneXpert MRSA Assay (FDA approved for NASAL specimens only), is one component of a comprehensive MRSA colonization surveillance program. It is not intended to diagnose MRSA infection nor to guide or monitor treatment for MRSA infections.   Surgical pcr screen     Status: None   Collection Time: 08/04/15  7:40 AM  Result Value Ref Range Status   MRSA, PCR NEGATIVE NEGATIVE Final   Staphylococcus aureus NEGATIVE NEGATIVE Final    Comment:        The Xpert SA Assay (FDA approved for NASAL specimens in patients over 41 years of age), is one component of a comprehensive surveillance program.  Test performance has been validated by [facility] for patients greater than or equal to 9 year old. It is not intended to diagnose infection nor to guide or monitor treatment.     Anti-infectives    Start     Dose/Rate Route Frequency Ordered Stop   08/04/15 2200  ceFAZolin (ANCEF) IVPB 1 g/50 mL premix  Status:  Discontinued     1 g 100 mL/hr over 30 Minutes Intravenous 3 times per day 08/04/15 1912 08/04/15 1944   08/03/15 1400  vancomycin (VANCOCIN) IVPB 1000 mg/200 mL premix     1,000 mg 200 mL/hr over 60 Minutes Intravenous  Once 08/03/15 1349 08/03/15 1655   08/02/15 0930  vancomycin (VANCOCIN) IVPB 1000 mg/200 mL premix     1,000 mg 200 mL/hr over 60 Minutes Intravenous  Once 08/02/15 0924 08/02/15 1130   08/01/15 1530  piperacillin-tazobactam (ZOSYN) IVPB 2.25 g     2.25 g 100 mL/hr over 30 Minutes Intravenous Every 8 hours 08/01/15 1450     08/01/15 1400  piperacillin-tazobactam (ZOSYN) IVPB 2.25 g  Status:  Discontinued     2.25 g 100 mL/hr over 30 Minutes Intravenous 3 times per day 08/01/15 0941 08/01/15 1450   08/01/15 0700  vancomycin (VANCOCIN) 2,000 mg in sodium chloride 0.9 % 500 mL IVPB     2,000 mg 250 mL/hr over 120 Minutes Intravenous  Once 08/01/15 0651 08/01/15 0908   08/01/15 0700   piperacillin-tazobactam (ZOSYN) IVPB 3.375 g     3.375 g 100 mL/hr over 30 Minutes Intravenous  Once 08/01/15 [id number] 08/01/15 0802      Assessment: 69 yo M on Vancomycin and Zosyn day #6 for R great toe wound/osteo.  Pt is s/p toe amputation 11/21 with plans to continue IV antibiotics through 11/23 then follow-up with 5 days fo PO Cipro.  Pre-HD Vancomycin level today was 19 (goal 15-25).  Will order post-HD Vancomycin dose accordingly.  This should provide enough antibiotic coverage through 11/23 as ortho recommended.  Currently HD schedule off days/extra treatments.  Will follow-up plans for next HD as well.  Goal of Therapy:  Vancomycin level 15-25 mcg/ml  Plan:  Vancomycin 1gm IV x 1 today. Continue Zosyn 2.25gm IV q8h Follow-up antibiotic stop date of 11/23 as recommended by ortho. Follow-up plans for next HD session (outpt TTS)  Horton Chin, Pharm.D., BCPS Clinical Pharmacist Pager [phone] 08/05/2015 3:34 PM

## 2015-08-05 NOTE — Progress Notes (Signed)
Pt returned to unit from OR. Denies pain/discomfort. Alert and oriented x4. No nausea or vomiting. Pt stats he's hungry and wants some real food. Diet advanced per order. Recovery room staff at bedside and report given. Surgical dreesing to right foot clean, dry, and intact.

## 2015-08-05 NOTE — Clinical Documentation Improvement (Signed)
Hospitalist and/or Nephrology  (Query responses must be documented in the progress notes and discharge summary.  Responses documented on the BPAs are not codeable because BPAs are not part of the permanent medical record.)  "Pulmonary Edema", "Volume Excess", and "Volume Overload from missing HD" are documented in the current medical record.  If possible, please document if a condition below provides greater specificity regarding the terms documented above:   - Noncardiogenic Acute Pulmonary Edema, including any associated condition(s) or cause(s)  - Other Condition  - Unable to clinically determine   Please exercise your independent, professional judgment when responding. A specific answer is not anticipated or expected.   Thank You,  Erling Conte  RN BSN CCDS [phone] Health Information Management [place]

## 2015-08-05 NOTE — Progress Notes (Addendum)
Progress Note   Blake [name] PZP:[id number] DOB: 1946-02-25 DOA: 08/01/2015 PCP: [name], MD   Brief Narrative:   Blake [name] is an 69 y.o. male with a PMH of end-stage renal disease on HD who was admitted 08/01/15 after being found down with confusion and slurred speech. Prior to this, he had a one-week history of vomiting. Blood sugars were 33. Patient also found to have right great toe osteomyelitis and will had amputation on 11/21. Home on 11/23 with [facility] services if stable.  Assessment/Plan:   Principal Problem:   Hypoglycemia associated with diabetes ([place]) with metabolic encephalopathy - Placed on a 10% dextrose drip on admission. Hypoglycemia resolved - CBGs in low 100's today; will monitor  - Mental status back to baseline. - will continue SSI -patient tolerating diet    Hypoxia - Desaturates with activity.  CXR with interstitial edema and small effusions.  Suspect from volume overload from missing HD. -to be corrected with HD -advise not to missed treatments -denies SOB today (11/22)    Diabetes mellitus with renal complications -no further hypoglycemic events - Outpatient regimen: 25 units of Lantus daily, 4 units of NovoLog with meals along with SSI.  - continue SSI and CBG's check -most likely osteomyelitis causing trouble with sugar regulation     Hypertension -continuie amlodipine. -BP controlled    ESRD on dialysis ([place])  - HD orders per nephrology. - Resume phoslo and calcitriol, renal to adjust     Ulcer of toe of right foot ([name])/right great toe osteomyelitis - Continue empiric vancomycin/Zosyn to complete 24 hours after amputation. Home with cipro BID for another 5 days as per orthopedic service rec's - ESR 35, CRP 1.1. - Discussed case with Dr. Lorin Mercy, amputation done on 07/21 without complications, doing ok. Will most likely going home in am.    Hypothermia - Temperature is 95.1 on admission. Placed on a Bair hugger. -  final Blood and urine cultures pending; but no signs of infection appreciated. - Continue broad-spectrum empiric antibiotics for now as coverage for right great toe osteomyelitis  -hypothermia most likely due to hypoglycemic event     Mechanical Fall - CT of the head negative for acute findings.  - Physical therapy evaluation ordered; will need to be repeated after amputation     DVT Prophylaxis - Heparin    Family Communication/Anticipated D/C date and plan/Code Status   Family Communication: No family at the bedside. Disposition Plan: Home when stable. Lives with his sister and niece. Will need home health PT. Anticipated D/C date:   Home tomorrow with [facility] services and outpatient follow up with orthopedic service  Code Status: Full code.   IV Access:    AVF right upper arm  PIV   Procedures and diagnostic studies:   Ct Head Wo Contrast  08/01/2015  CLINICAL DATA:  Altered mental status. EXAM: CT HEAD WITHOUT CONTRAST TECHNIQUE: Contiguous axial images were obtained from the base of the skull through the vertex without intravenous contrast. COMPARISON:  None. FINDINGS: Mild motion related to patient's snoring. No intracranial hemorrhage, mass effect, or midline shift. No hydrocephalus. The basilar cisterns are patent. Moderate periventricular white matter change, most consistent chronic small vessel ischemia. No evidence of territorial infarct. No intracranial fluid collection. Calvarium is intact. Included paranasal sinuses and mastoid air cells are well aerated. IMPRESSION: 1. No acute intracranial abnormality allowing for patient motion. 2. Moderate periventricular white matter change, most commonly related to chronic small vessel ischemia. Electronically Signed  By: Jeb Levering M.D.   On: 08/01/2015 06:27   Dg Chest Port 1 View  08/01/2015  CLINICAL DATA:  Altered mental status. EXAM: PORTABLE CHEST 1 VIEW COMPARISON:  PA and lateral chest 07/10/2014. Single view of the  chest 07/05/2014. FINDINGS: Left IJ approach dialysis catheter present on the prior examinations has been removed. There is cardiomegaly and interstitial edema. Small bilateral pleural effusions are seen. IMPRESSION: Cardiomegaly and mild interstitial edema with associated small effusions. Electronically Signed   By: Inge Rise M.D.   On: 08/01/2015 07:18   Dg Toe Great Right  08/01/2015  CLINICAL DATA:  Ulcer on posterior right great toe x 6 months. Hx of diabetes. EXAM: RIGHT GREAT TOE COMPARISON:  02/25/2010 FINDINGS: There is loss of the white cortical line along portions of the distal tuft of the great toe consistent with osteomyelitis. There is soft tissue air along the plantar margin irregularity of the dorsal distal soft tissues. No fracture. No other evidence of osteomyelitis. Joints are normally aligned. IMPRESSION: Osteomyelitis of the distal tuft of the right great toe with adjacent soft tissue air likely from the skin ulcer. Electronically Signed   By: Lajean Manes M.D.   On: 08/01/2015 14:20     Medical Consultants:    Nephrology- Dr Melvia Heaps  Orthopedic Surgery- Dr. Lorin Mercy  Anti-Infectives:   Anti-infectives    Start     Dose/Rate Route Frequency Ordered Stop   08/05/15 1600  vancomycin (VANCOCIN) IVPB 1000 mg/200 mL premix     1,000 mg 200 mL/hr over 60 Minutes Intravenous  Once 08/05/15 1536     08/04/15 2200  ceFAZolin (ANCEF) IVPB 1 g/50 mL premix  Status:  Discontinued     1 g 100 mL/hr over 30 Minutes Intravenous 3 times per day 08/04/15 1912 08/04/15 1944   08/03/15 1400  vancomycin (VANCOCIN) IVPB 1000 mg/200 mL premix     1,000 mg 200 mL/hr over 60 Minutes Intravenous  Once 08/03/15 1349 08/03/15 1655   08/02/15 0930  vancomycin (VANCOCIN) IVPB 1000 mg/200 mL premix     1,000 mg 200 mL/hr over 60 Minutes Intravenous  Once 08/02/15 0924 08/02/15 1130   08/01/15 1530  piperacillin-tazobactam (ZOSYN) IVPB 2.25 g     2.25 g 100 mL/hr over 30 Minutes  Intravenous Every 8 hours 08/01/15 1450     08/01/15 1400  piperacillin-tazobactam (ZOSYN) IVPB 2.25 g  Status:  Discontinued     2.25 g 100 mL/hr over 30 Minutes Intravenous 3 times per day 08/01/15 0941 08/01/15 1450   08/01/15 0700  vancomycin (VANCOCIN) 2,000 mg in sodium chloride 0.9 % 500 mL IVPB     2,000 mg 250 mL/hr over 120 Minutes Intravenous  Once 08/01/15 0651 08/01/15 0908   08/01/15 0700  piperacillin-tazobactam (ZOSYN) IVPB 3.375 g     3.375 g 100 mL/hr over 30 Minutes Intravenous  Once 08/01/15 0156 08/01/15 0802      Subjective:   [name] has not had any fever, chills, CP or SOB. Reports moderate pain on his right foot, but otherwise no acute issues.   Objective:    Filed Vitals:   08/05/15 1100 08/05/15 1130 08/05/15 1200 08/05/15 1225  BP: 139/69 134/49 151/64 149/70  Pulse: 73 78 76 76  Temp:    98.1 F (36.7 C)  TempSrc:    Oral  Resp: _0 Height:      Weight:    93.9 kg (207 lb 0.2 oz)  SpO2:  98%    Intake/Output Summary (Last 24 hours) at 08/05/15 1630 Last data filed at 08/05/15 1225  Gross per 24 hour  Intake    280 ml  Output   1500 ml  Net  -1220 ml   Filed Weights   08/04/15 2009 08/05/15 0810 08/05/15 1225  Weight: 93.8 kg (206 lb 12.7 oz) 95.4 kg (210 lb 5.1 oz) 93.9 kg (207 lb 0.2 oz)    Exam: Gen:  NAD, AAOX3, afebrile and denying CP or SOB. Ready for surgery today Cardiovascular:  RRR, No M/R/G Respiratory:  Lungs CTAB Gastrointestinal:  Abdomen soft, NT/ND, + BS Extremities:  No C/E/C, right foot with clean intact dressing; no edema on his leg.  Neuro: non focal deficit  Data Reviewed:    Labs: Basic Metabolic Panel:  Recent Labs Lab 08/01/15 0551 08/01/15 1353 08/02/15 0315 08/03/15 1444 08/05/15 0407 08/05/15 0630  NA 141 140 137 133* 139  --   K 3.3* 4.4 3.8 4.5 4.8  --   CL 103 101 100* 97* 101  --   CO2 _0 --   GLUCOSE 65 236* 168* 120* 151*  --   BUN 54* 52* 17 23* 37*   --   CREATININE 8.99* 8.72* 4.77* 5.67* 6.98*  --   CALCIUM 7.4* 7.2* 7.5* 7.9* 8.0*  --   PHOS  --  4.8*  --  4.0  --  3.9   Liver Function Tests:  Recent Labs Lab 08/01/15 0551 08/01/15 1353 08/03/15 1444  AST 29  --   --   ALT 21  --   --   ALKPHOS 84  --   --   BILITOT 0.5  --   --   PROT 6.4*  --   --   ALBUMIN 3.2* 2.9* 3.1*    Recent Labs Lab 08/01/15 0522  AMMONIA 27   CBC:  Recent Labs Lab 08/01/15 0551 08/01/15 1352 08/02/15 0315 08/03/15 1444 08/05/15 0630  WBC 7.4 6.8 7.8 5.5 6.7  NEUTROABS 5.0  --   --   --   --   HGB 10.9* 11.1* 10.9* 11.6* 11.8*  HCT 35.3* 34.6* 33.8* 36.5* 36.7*  MCV 92.2 91.1 90.9 89.7 90.2  PLT 180 170 175 168 172   CBG:  Recent Labs Lab 08/04/15 1818 08/04/15 1914 08/04/15 1947 08/05/15 0731 08/05/15 1255  GLUCAP 85 108* 96 85 107*    Microbiology Recent Results (from the past 240 hour(s))  Blood culture (routine x 2)     Status: None (Preliminary result)   Collection Time: 08/01/15  6:03 AM  Result Value Ref Range Status   Specimen Description BLOOD LEFT ANTECUBITAL  Final   Special Requests BOTTLES DRAWN AEROBIC AND ANAEROBIC 5ML  Final   Culture NO GROWTH 4 DAYS  Final   Report Status PENDING  Incomplete  Blood culture (routine x 2)     Status: None (Preliminary result)   Collection Time: 08/01/15  6:10 AM  Result Value Ref Range Status   Specimen Description BLOOD HAND LEFT  Final   Special Requests BOTTLES DRAWN AEROBIC ONLY 2CC  Final   Culture NO GROWTH 4 DAYS  Final   Report Status PENDING  Incomplete  Urine culture     Status: None   Collection Time: 08/01/15  6:35 AM  Result Value Ref Range Status   Specimen Description URINE, CATHETERIZED  Final   Special Requests NONE  Final   Culture NO GROWTH 1 DAY  Final   Report Status 08/02/2015 FINAL  Final  MRSA PCR Screening     Status: None   Collection Time: 08/01/15  7:09 PM  Result Value Ref Range Status   MRSA by PCR NEGATIVE NEGATIVE Final     Comment:        The GeneXpert MRSA Assay (FDA approved for NASAL specimens only), is one component of a comprehensive MRSA colonization surveillance program. It is not intended to diagnose MRSA infection nor to guide or monitor treatment for MRSA infections.   Surgical pcr screen     Status: None   Collection Time: 08/04/15  7:40 AM  Result Value Ref Range Status   MRSA, PCR NEGATIVE NEGATIVE Final   Staphylococcus aureus NEGATIVE NEGATIVE Final    Comment:        The Xpert SA Assay (FDA approved for NASAL specimens in patients over 76 years of age), is one component of a comprehensive surveillance program.  Test performance has been validated by [facility] for patients greater than or equal to 68 year old. It is not intended to diagnose infection nor to guide or monitor treatment.      Medications:   . amLODipine  10 mg Oral Daily  . antiseptic oral rinse  7 mL Mouth Rinse BID  . aspirin EC  81 mg Oral Daily  . atorvastatin  20 mg Oral Daily  . buPROPion  300 mg Oral Daily  . calcitRIOL  0.25 mcg Oral Daily  . calcium acetate  667 mg Oral TID WC  . docusate sodium  100 mg Oral BID  . doxercalciferol  4 mcg Intravenous Q T,Th,Sa-HD  . DULoxetine  20 mg Oral Daily  . feeding supplement (NEPRO CARB STEADY)  237 mL Oral BID BM  . ferric gluconate (FERRLECIT/NULECIT) IV  62.5 mg Intravenous Q Sat-HD  . heparin  5,000 Units Subcutaneous 3 times per day  . HYDROmorphone      . insulin aspart  0-5 Units Subcutaneous QHS  . insulin aspart  0-9 Units Subcutaneous TID WC  . pantoprazole  40 mg Oral Daily  . piperacillin-tazobactam (ZOSYN)  IV  2.25 g Intravenous Q8H  . sevelamer carbonate  800 mg Oral TID WC  . vancomycin  1,000 mg Intravenous Once   Continuous Infusions: . 0.45 % NaCl with KCl 20 mEq / L 75 mL/hr at 08/04/15 2011    Time spent: 35 minutes.      LOS: 4 days   Barton Dubois  [facility] Pager [phone]  08/05/2015, 4:30 PM

## 2015-08-05 NOTE — Progress Notes (Signed)
Orthopedic Tech Progress Note Patient Details:  Blake [name] 1946/05/26 [id number]  Ortho Devices Type of Ortho Device: Darco shoe Ortho Device/Splint Location: rle Ortho Device/Splint Interventions: Application   Hildred Priest 08/05/2015, 8:47 AM

## 2015-08-05 NOTE — Progress Notes (Signed)
Subjective: 1 Day Post-Op Procedure(s) (LRB): RIGHT GREAT TOE AMPUTATION (Right) Patient reports pain as mild.    Objective: Vital signs in last 24 hours: Temp:  [97.3 F (36.3 C)-98.7 F (37.1 C)] 98.1 F (36.7 C) (11/22 0604) Pulse Rate:  [64-85] 79 (11/22 0604) Resp:  [0-22] 17 (11/22 0604) BP: (118-195)/(53-73) 118/58 mmHg (11/22 0604) SpO2:  [92 %-100 %] 100 % (11/22 0604) FiO2 (%):  [28 %] 28 % (11/21 1913) Weight:  [93.8 kg (206 lb 12.7 oz)] 93.8 kg (206 lb 12.7 oz) (11/21 2009)  Intake/Output from previous day: 11/21 0701 - 11/22 0700 In: 760 [P.O.:660; I.V.:100] Out: 0  Intake/Output this shift:     Recent Labs  08/03/15 1444  HGB 11.6*    Recent Labs  08/03/15 1444  WBC 5.5  RBC 4.07*  HCT 36.5*  PLT 168    Recent Labs  08/03/15 1444 08/05/15 0407  NA 133* 139  K 4.5 4.8  CL 97* 101  CO2 27 29  BUN 23* 37*  CREATININE 5.67* 6.98*  GLUCOSE 120* 151*  CALCIUM 7.9* 8.0*   No results for input(s): LABPT, INR in the last 72 hours.  Neurologically intact  Bleeding thru dressing . Reinforced by nurse.     Assessment/Plan: 1 Day Post-Op Procedure(s) (LRB): RIGHT GREAT TOE AMPUTATION (Right) Plan darco shoe, elevation of foot when in bed. Could go home from my standpoint Wed.      Would continue IV ABX until Wed then send home on PO ABX.   Cipro 500mg  po BID for 5 days. Thanks  Lorin Mercy  [phone]  [name] 08/05/2015, 7:42 AM

## 2015-08-05 NOTE — Procedures (Signed)
Patient was seen on dialysis and the procedure was supervised. BFR 400 Via RUE AVF BP is 118/58.  Patient appears to be tolerating treatment well.  He tolerated the amputation of his right great toe well but has noticed some bleeding from the surgical site last night.  Overall doing well.

## 2015-08-05 NOTE — Op Note (Signed)
NAME:  [name], GUARD NO.:  [id number]  MEDICAL RECORD NO.:  [id number]  LOCATION:  [id number]                        FACILITY:  [facility]  PHYSICIAN:  Deosha Werden C. Lorin Mercy, M.D.    DATE OF BIRTH:  1946-07-09  DATE OF PROCEDURE:  08/04/2015 DATE OF DISCHARGE:                              OPERATIVE REPORT   PREOPERATIVE DIAGNOSIS:  Right great toe osteomyelitis.  POSTOPERATIVE DIAGNOSIS:  Right great toe osteomyelitis.  PROCEDURE:  Right great toe amputation.  SURGEON:  Kristena Wilhelmi C. Lorin Mercy, M.D.  ANESTHESIA:  IV sedation with ankle block.  TOURNIQUET:  Esmarch tourniquet, less than 20 minutes.  DESCRIPTION OF PROCEDURE:  After standard prepping and draping, the patient has already been on antibiotics.  DuraPrep was used for prepping, stockinette.  Sterile skin marker was used.  Time-out procedure completed.  [place]-type incision was made using the dorsum and plantar surface flap stopping at the IP joint and then on the medial aspect, extended proximally.  Continued dissection cutting through the extensor tendon at the MP joint was performed.  The sesamoids were left. Flexor tendons were divided.  Toe was removed and tissue at the level of amputation looked good.  Esmarch tourniquet was removed.  Common digital artery was coagulated.  Nerve was cut back on stretch and allowed to retract.  Dorsal veins needed minimal coagulation.  Skin edges showed adequate capillary refill.  Copious irrigation with saline solution and then closure with 2-0 nylon simple interrupted sutures.  Xeroform, 4x4s, Kerlix and Coban were applied for postoperative dressing.  The patient tolerated the procedure well and transferred to the recovery room in stable condition.     Inaya Gillham C. Lorin Mercy, M.D.     MCY/MEDQ  D:  08/04/2015  T:  08/05/2015  Job:  [id number]

## 2015-08-06 DIAGNOSIS — M869 Osteomyelitis, unspecified: Secondary | ICD-10-CM

## 2015-08-06 LAB — CULTURE, BLOOD (ROUTINE X 2)
CULTURE: NO GROWTH
Culture: NO GROWTH

## 2015-08-06 LAB — BASIC METABOLIC PANEL
Anion gap: 10 (ref 5–15)
BUN: 29 mg/dL — AB (ref 6–20)
CALCIUM: 8.2 mg/dL — AB (ref 8.9–10.3)
CO2: 27 mmol/L (ref 22–32)
CREATININE: 5.88 mg/dL — AB (ref 0.61–1.24)
Chloride: 99 mmol/L — ABNORMAL LOW (ref 101–111)
GFR, EST AFRICAN AMERICAN: 10 mL/min — AB (ref >60)
GFR, EST NON AFRICAN AMERICAN: 9 mL/min — AB (ref >60)
Glucose, Bld: 111 mg/dL — ABNORMAL HIGH (ref 65–99)
Potassium: 5.2 mmol/L — ABNORMAL HIGH (ref 3.5–5.1)
SODIUM: 136 mmol/L (ref 135–145)

## 2015-08-06 LAB — GLUCOSE, CAPILLARY
GLUCOSE-CAPILLARY: 241 mg/dL — AB (ref 65–99)
Glucose-Capillary: 103 mg/dL — ABNORMAL HIGH (ref 65–99)

## 2015-08-06 MED ORDER — CIPROFLOXACIN HCL 500 MG PO TABS: 500.0000 mg | ORAL_TABLET | Freq: Every day | ORAL | Status: DC

## 2015-08-06 MED ORDER — CINACALCET HCL 30 MG PO TABS: 30.0000 mg | ORAL_TABLET | Freq: Every day | ORAL | Status: DC

## 2015-08-06 NOTE — Evaluation (Signed)
Physical Therapy Evaluation Patient Details Name: Blake [name] MRN: [id number] DOB: 21-Oct-1945 Today's Date: 08/06/2015   History of Present Illness  Blake [name] is an 69 y.o. male with a PMH of end-stage renal disease on HD who was admitted 08/01/15 after being found down with confusion and slurred speech. Prior to this, he had a one-week history of vomiting. Blood sugars were 33. Pt s/p R toe amputation 11/21.  Clinical Impression  Good progress, stable with RW use during ambulation. Safely navigates stairs and verbalizes understanding of precautions with mobility. Adequate for d/c from PT standpoint when medically ready.    Follow Up Recommendations Home health PT;Supervision/Assistance - 24 hour    Equipment Recommendations  Rolling walker with 5" wheels    Recommendations for Other Services       Precautions / Restrictions Precautions Precautions: Fall Required Braces or Orthoses: Other Brace/Splint Other Brace/Splint: darco shoe - off weights for foot Restrictions Weight Bearing Restrictions: No      Mobility  Bed Mobility Overal bed mobility: Independent                Transfers Overall transfer level: Needs assistance Equipment used: Rolling walker (2 wheeled) Transfers: Sit to/from Stand Sit to Stand: Supervision         General transfer comment: Cues for hand placement. Performed x2 from bed.  Ambulation/Gait Ambulation/Gait assistance: Supervision Ambulation Distance (Feet): 120 Feet Assistive device: Rolling walker (2 wheeled) Gait Pattern/deviations: Step-to pattern;Decreased step length - left;Decreased stance time - right;Decreased stride length;Antalgic;Trunk flexed Gait velocity: decreased Gait velocity interpretation: Below normal speed for age/gender General Gait Details: VC for upright posture and forward gaze. Cues to maintain WB through heel only. No overt loss of balance however rushes around corners and needs cues for  safety and walker control.   Stairs Stairs: Yes Stairs assistance: Supervision Stair Management: Two rails;Step to pattern;Forwards Number of Stairs: 4 General stair comments: Educated on safe navigation of stairs with cues for sequencing. Verbalizes understanding after practice. No loss of balance with use of rails similar to home environment.  Wheelchair Mobility    Modified Rankin (Stroke Patients Only)       Balance                                             Pertinent Vitals/Pain Pain Assessment: Faces Faces Pain Scale: Hurts a little bit Pain Location: Rt foot Pain Descriptors / Indicators: Throbbing Pain Intervention(s): Monitored during session;Repositioned    Home Living                        Prior Function                 Hand Dominance        Extremity/Trunk Assessment                         Communication      Cognition Arousal/Alertness: Awake/alert Behavior During Therapy: WFL for tasks assessed/performed Overall Cognitive Status: Within Functional Limits for tasks assessed                      General Comments General comments (skin integrity, edema, etc.): Reviewed elevation and safety precautions with mobility.    Exercises General Exercises - Lower Extremity Ankle Circles/Pumps: AROM;Both;10 reps;Seated  Long Arc Quad: Strengthening;10 reps;Right;Seated      Assessment/Plan    PT Assessment    PT Diagnosis     PT Problem List    PT Treatment Interventions     PT Goals (Current goals can be found in the Care Plan section) Acute Rehab PT Goals PT Goal Formulation: With patient Time For Goal Achievement: 08/16/15 Potential to Achieve Goals: Good    Frequency Min 3X/week   Barriers to discharge        Co-evaluation               End of Session Equipment Utilized During Treatment: Gait belt Activity Tolerance: Patient tolerated treatment well Patient left: in  bed;with call bell/phone within reach (RLE elevated) Nurse Communication: Mobility status         Time: [id number] PT Time Calculation (min) (ACUTE ONLY): 14 min   Charges:     PT Treatments $Gait Training: 8-22 mins   PT G Codes:        Ellouise Newer 08/06/2015, 10:12 AM  Elayne Snare, [place]

## 2015-08-06 NOTE — Discharge Summary (Signed)
Physician Discharge Summary  Blake [name] [id number] DOB: 21-Dec-1945 DOA: 08/01/2015  PCP: [name], MD  Admit date: 08/01/2015 Discharge date: 08/06/2015  Time spent: 35 minutes  Recommendations for Outpatient Follow-up:  1. Please follow-up on blood sugars as Blake [name] presented with hypoglycemia 2. Please follow-up on right great toe wound, status post right toe amputation secondary to osteomyelitis 3. Patient to resume scheduled hemodialysis   Discharge Diagnoses:  Principal Problem:   Hypoglycemia associated with diabetes ([place]) Active Problems:   Hypoxia   Hypertension   ESRD on dialysis ([place])   Ulcer of toe of right foot ([place])   Hypothermia   Fall   Encephalopathy, metabolic   Acute osteomyelitis of toe of right foot [facility])   Discharge Condition: Stable  Diet recommendation: Renal diet  Filed Weights   08/05/15 0810 08/05/15 1225 08/05/15 2006  Weight: 95.4 kg (210 lb 5.1 oz) 93.9 kg (207 lb 0.2 oz) 94.1 kg (207 lb 7.3 oz)    History of present illness:  Blake [name] is a 69 y.o. male with end-stage renal disease on hemodialysis. Patient brought into the emergency department today after family found him on the floor this morning. Patient was confused, having slurred speech. Blood sugars found to be 33. Torted on 10% dextrose drip. Blood sugar fell again, rate drip increased to 150 mL's per hour and blood sugar. currently 89.  Patient states he had been vomiting for a week, unable to hold down fluids. He has a history of chronic nausea which had improved significantly with medication ( ? Anti-emetic). No abdominal pain. No diarrhea. Other recent than vomiting, patient has no other complaints   Hospital Course:  Blake [name] is a pleasant 69 year old gentleman with a past medical history of end-stage renal disease undergoing hemodialysis who was admitted to the medicine service on 08/01/2015. He presented with mental status changes in  setting of hypoglycemia (having blood sugars of 33) and hypothermic. He was administered dextrose and required warming blanket. He was found to have ulcer involving right great toe that was imaged showing osteomyelitis of the distal tuft of the right great toe with adjacent soft tissue air likely from the skin ulcer. He was started on empiric IV antibiotic therapy with vancomycin and Zosyn. Case was discussed with Dr. Lorin Mercy of orthopedic surgery. He was taken to the operating room on 08/05/2015 where he underwent amputation of right great toe. He tolerated procedure well there no immediate complications. During this hospitalization he was seen by nephrology undergoing hemodialysis. Bilevel 23 2016 he reported feeling much better. Blood sugars were under control. He was discharged with home in stable condition, given a prescription for ciprofloxacin 5 mg by mouth daily 5 days. He was advised to check his blood sugars on a daily basis and record these guys on logbook which he will present to his PCP.   Procedures:  Amputation of right great toe secondary to osteomyelitis, procedure performed on 08/05/2015.  Consultations:  Orthopedic surgery, Dr. Inda Merlin  Nephrology, Dr Marval Regal  Discharge Exam: Filed Vitals:   08/06/15 0422 08/06/15 1107  BP: 109/57 137/60  Pulse: 73   Temp: 98.4 F (36.9 C)   Resp: 20     Gen: NAD, AAOX3, states feeling well, in no acute distress anticipating going home today. Cardiovascular: RRR, No M/R/G Respiratory: Lungs CTAB Gastrointestinal: Abdomen soft, NT/ND, + BS Extremities: No C/E/C, right foot with clean intact dressing; no edema on his leg.  Neuro: non focal deficit  Discharge Instructions  Discharge Instructions    Call MD for:  difficulty breathing, headache or visual disturbances    Complete by:  As directed      Call MD for:  extreme fatigue    Complete by:  As directed      Call MD for:  hives    Complete by:  As directed      Call MD  for:  persistant dizziness or light-headedness    Complete by:  As directed      Call MD for:  persistant nausea and vomiting    Complete by:  As directed      Call MD for:  redness, tenderness, or signs of infection (pain, swelling, redness, odor or green/yellow discharge around incision site)    Complete by:  As directed      Call MD for:  severe uncontrolled pain    Complete by:  As directed      Call MD for:  temperature >100.4    Complete by:  As directed      Call MD for:    Complete by:  As directed      Diet - low sodium heart healthy    Complete by:  As directed      Increase activity slowly    Complete by:  As directed           Current Discharge Medication List    START taking these medications   Details  ciprofloxacin (CIPRO) 500 MG tablet Take 1 tablet (500 mg total) by mouth daily with breakfast. Qty: 5 tablet, Refills: 0      CONTINUE these medications which have NOT CHANGED   Details  aspirin EC 81 MG tablet Take 81 mg by mouth daily.    atorvastatin (LIPITOR) 20 MG tablet Take 20 mg by mouth daily.    buPROPion (WELLBUTRIN XL) 150 MG 24 hr tablet Take 300 mg by mouth daily.    DULoxetine (CYMBALTA) 20 MG capsule Take 1 capsule (20 mg total) by mouth daily. Refills: 3    multivitamin (RENA-VIT) TABS tablet Take 1 tablet by mouth daily.    pantoprazole (PROTONIX) 40 MG tablet Take 1 tablet (40 mg total) by mouth daily.    SENSIPAR 30 MG tablet Take 1 tablet by mouth daily.    sevelamer carbonate (RENVELA) 800 MG tablet Take 1 tablet (800 mg total) by mouth 3 (three) times daily with meals. Qty: 90 tablet, Refills: 11    allopurinol (ZYLOPRIM) 100 MG tablet     amLODipine (NORVASC) 10 MG tablet     calcitRIOL (ROCALTROL) 0.25 MCG capsule Take 0.25 mcg by mouth daily.    calcium acetate (PHOSLO) 667 MG tablet Take 1 tablet by mouth 3 (three) times daily with meals. Qty: 90 tablet, Refills: 11    NOVOLIN R RELION 100 UNIT/ML injection Inject 3-13  Units into the skin 3 (three) times daily with meals as needed for high blood sugar.     Nutritional Supplements (FEEDING SUPPLEMENT, NEPRO CARB STEADY,) LIQD Take 237 mLs by mouth 2 (two) times daily between meals. Refills: 0      STOP taking these medications     calcium acetate (PHOSLO) 667 MG capsule      furosemide (LASIX) 40 MG tablet      NOVOLIN N RELION 100 UNIT/ML injection        No Known Allergies Follow-up Information    Follow up with [name], MD On 08/13/2015.   Specialty:  Orthopedic Surgery  Why:  APPOINTMENT: Wednesday, 08-13-15 @ 10:15 am   Contact information:   [place] [address] [phone]        The results of significant diagnostics from this hospitalization (including imaging, microbiology, ancillary and laboratory) are listed below for reference.    Significant Diagnostic Studies: Ct Head Wo Contrast  08/01/2015  CLINICAL DATA:  Altered mental status. EXAM: CT HEAD WITHOUT CONTRAST TECHNIQUE: Contiguous axial images were obtained from the base of the skull through the vertex without intravenous contrast. COMPARISON:  None. FINDINGS: Mild motion related to patient's snoring. No intracranial hemorrhage, mass effect, or midline shift. No hydrocephalus. The basilar cisterns are patent. Moderate periventricular white matter change, most consistent chronic small vessel ischemia. No evidence of territorial infarct. No intracranial fluid collection. Calvarium is intact. Included paranasal sinuses and mastoid air cells are well aerated. IMPRESSION: 1. No acute intracranial abnormality allowing for patient motion. 2. Moderate periventricular white matter change, most commonly related to chronic small vessel ischemia. Electronically Signed   By: Jeb Levering M.D.   On: 08/01/2015 06:27   Dg Chest Port 1 View  08/01/2015  CLINICAL DATA:  Altered mental status. EXAM: PORTABLE CHEST 1 VIEW COMPARISON:  PA and lateral chest  07/10/2014. Single view of the chest 07/05/2014. FINDINGS: Left IJ approach dialysis catheter present on the prior examinations has been removed. There is cardiomegaly and interstitial edema. Small bilateral pleural effusions are seen. IMPRESSION: Cardiomegaly and mild interstitial edema with associated small effusions. Electronically Signed   By: Inge Rise M.D.   On: 08/01/2015 07:18   Dg Toe Great Right  08/01/2015  CLINICAL DATA:  Ulcer on posterior right great toe x 6 months. Hx of diabetes. EXAM: RIGHT GREAT TOE COMPARISON:  02/25/2010 FINDINGS: There is loss of the white cortical line along portions of the distal tuft of the great toe consistent with osteomyelitis. There is soft tissue air along the plantar margin irregularity of the dorsal distal soft tissues. No fracture. No other evidence of osteomyelitis. Joints are normally aligned. IMPRESSION: Osteomyelitis of the distal tuft of the right great toe with adjacent soft tissue air likely from the skin ulcer. Electronically Signed   By: Lajean Manes M.D.   On: 08/01/2015 14:20    Microbiology: Recent Results (from the past 240 hour(s))  Blood culture (routine x 2)     Status: None   Collection Time: 08/01/15  6:03 AM  Result Value Ref Range Status   Specimen Description BLOOD LEFT ANTECUBITAL  Final   Special Requests BOTTLES DRAWN AEROBIC AND ANAEROBIC 5ML  Final   Culture NO GROWTH 5 DAYS  Final   Report Status 08/06/2015 FINAL  Final  Blood culture (routine x 2)     Status: None   Collection Time: 08/01/15  6:10 AM  Result Value Ref Range Status   Specimen Description BLOOD HAND LEFT  Final   Special Requests BOTTLES DRAWN AEROBIC ONLY 2CC  Final   Culture NO GROWTH 5 DAYS  Final   Report Status 08/06/2015 FINAL  Final  Urine culture     Status: None   Collection Time: 08/01/15  6:35 AM  Result Value Ref Range Status   Specimen Description URINE, CATHETERIZED  Final   Special Requests NONE  Final   Culture NO GROWTH 1  DAY  Final   Report Status 08/02/2015 FINAL  Final  MRSA PCR Screening     Status: None   Collection Time: 08/01/15  7:09 PM  Result  Value Ref Range Status   MRSA by PCR NEGATIVE NEGATIVE Final    Comment:        The GeneXpert MRSA Assay (FDA approved for NASAL specimens only), is one component of a comprehensive MRSA colonization surveillance program. It is not intended to diagnose MRSA infection nor to guide or monitor treatment for MRSA infections.   Surgical pcr screen     Status: None   Collection Time: 08/04/15  7:40 AM  Result Value Ref Range Status   MRSA, PCR NEGATIVE NEGATIVE Final   Staphylococcus aureus NEGATIVE NEGATIVE Final    Comment:        The Xpert SA Assay (FDA approved for NASAL specimens in patients over 73 years of age), is one component of a comprehensive surveillance program.  Test performance has been validated by [facility] for patients greater than or equal to 87 year old. It is not intended to diagnose infection nor to guide or monitor treatment.      Labs: Basic Metabolic Panel:  Recent Labs Lab 08/01/15 1353 08/02/15 0315 08/03/15 1444 08/05/15 0407 08/05/15 0630 08/06/15 0541  NA 140 137 133* 139  --  136  K 4.4 3.8 4.5 4.8  --  5.2*  CL 101 100* 97* 101  --  99*  CO2 26 27 27 29   --  27  GLUCOSE 236* 168* 120* 151*  --  111*  BUN 52* 17 23* 37*  --  29*  CREATININE 8.72* 4.77* 5.67* 6.98*  --  5.88*  CALCIUM 7.2* 7.5* 7.9* 8.0*  --  8.2*  PHOS 4.8*  --  4.0  --  3.9  --    Liver Function Tests:  Recent Labs Lab 08/01/15 0551 08/01/15 1353 08/03/15 1444  AST 29  --   --   ALT 21  --   --   ALKPHOS 84  --   --   BILITOT 0.5  --   --   PROT 6.4*  --   --   ALBUMIN 3.2* 2.9* 3.1*   No results for input(s): LIPASE, AMYLASE in the last 168 hours.  Recent Labs Lab 08/01/15 0522  AMMONIA 27   CBC:  Recent Labs Lab 08/01/15 0551 08/01/15 1352 08/02/15 0315 08/03/15 1444 08/05/15 0630  WBC 7.4 6.8 7.8 5.5  6.7  NEUTROABS 5.0  --   --   --   --   HGB 10.9* 11.1* 10.9* 11.6* 11.8*  HCT 35.3* 34.6* 33.8* 36.5* 36.7*  MCV 92.2 91.1 90.9 89.7 90.2  PLT 180 170 175 168 172   Cardiac Enzymes: No results for input(s): CKTOTAL, CKMB, CKMBINDEX, TROPONINI in the last 168 hours. BNP: BNP (last 3 results) No results for input(s): BNP in the last 8760 hours.  ProBNP (last 3 results) No results for input(s): PROBNP in the last 8760 hours.  CBG:  Recent Labs Lab 08/05/15 1255 08/05/15 1648 08/05/15 2006 08/06/15 0735 08/06/15 1122  GLUCAP 107* 216* 142* 103* 241*       Signed:  Akeylah Hendel  [facility] 08/06/2015, 1:38 PM

## 2015-08-06 NOTE — Progress Notes (Signed)
[facility] Progress Note  Assessment/Plan: 1. DM with hypoglycemia/hypothermia - Per primary BS now controlled. Normothermic. Resolved.  2. R great toe ischemia/ osteo- right great toe osteomyelitis. S/P R great toe amputation. Possible DC today.   3. Hypoxemia /pulm edema/vol excess - had HD 08/05/15 off schedule. Net UF 1500. Post wt 93.9 KG. BP Stable.  4. ESRD - TTS at [facility].  5. HTN -good control on norvasc only 6. Anemia - Hgb 11.8 08/05/15. no ESA for now, Continue weekly Fe 7. Metabolic bone disease - Continue hectorol; on renvela 1 ac; resume sensipar.  Ca 8.2 C Ca 8.92 (08/05/15). Cont 2.5 Ca bath. Patient is receiving hectoral with HD, does not need oral calcitriol daily-will DC. 8. Nutrition - needs renal carb mod diet/vitamin. Albumin 3.1 NPO at present for surgery.   Rita H. Brown NP-C 08/06/2015, 9:46 AM  [facility] [phone]  Subjective: "I feel good". Sleeping, easily aroused. No C/O pain/SOB  Objective Filed Vitals:   08/05/15 1225 08/05/15 1728 08/05/15 2006 08/06/15 0422  BP: 149/70 130/46 130/48 109/57  Pulse: 76 79 79 73  Temp: 98.1 F (36.7 C) 98.6 F (37 C) 98 F (36.7 C) 98.4 F (36.9 C)  TempSrc: Oral Oral Oral Oral  Resp: 18 18 19 20   Height:      Weight: 93.9 kg (207 lb 0.2 oz)  94.1 kg (207 lb 7.3 oz)   SpO2: 98% 98% 99% 99%   Physical Exam General: Well nourished male NAD Heart: S1, S2, RRR.  Lungs: Bilateral breath sounds CTA Abdomen: soft, active BS Extremities: no LE edema. R foot with drsg intact, CDI.  Dialysis Access: RUA AVF + bruit  Dialysis Orders: TueThuSat, 4 hrs 0 min, 180NRe Optiflux, BFR 400, DFR Manual 800 mL/min, EDW 97.5 (kg), Dialysate 2.0 K, 2.5 Ca, UFR Profile: None, Sodium Model: None, Access: RUA AV Fistula. Heparin: 9000 units per treatment Hectoral: 4 mcg IV per treatment Venofer: 50 mg IV weekly   Additional Objective Labs: Basic Metabolic Panel:  Recent  Labs Lab 08/01/15 1353  08/03/15 1444 08/05/15 0407 08/05/15 0630 08/06/15 0541  NA 140  < > 133* 139  --  136  K 4.4  < > 4.5 4.8  --  5.2*  CL 101  < > 97* 101  --  99*  CO2 26  < > 27 29  --  27  GLUCOSE 236*  < > 120* 151*  --  111*  BUN 52*  < > 23* 37*  --  29*  CREATININE 8.72*  < > 5.67* 6.98*  --  5.88*  CALCIUM 7.2*  < > 7.9* 8.0*  --  8.2*  PHOS 4.8*  --  4.0  --  3.9  --   < > = values in this interval not displayed. Liver Function Tests:  Recent Labs Lab 08/01/15 0551 08/01/15 1353 08/03/15 1444  AST 29  --   --   ALT 21  --   --   ALKPHOS 84  --   --   BILITOT 0.5  --   --   PROT 6.4*  --   --   ALBUMIN 3.2* 2.9* 3.1*   No results for input(s): LIPASE, AMYLASE in the last 168 hours. CBC:  Recent Labs Lab 08/01/15 0551 08/01/15 1352 08/02/15 0315 08/03/15 1444 08/05/15 0630  WBC 7.4 6.8 7.8 5.5 6.7  NEUTROABS 5.0  --   --   --   --   HGB 10.9* 11.1*  10.9* 11.6* 11.8*  HCT 35.3* 34.6* 33.8* 36.5* 36.7*  MCV 92.2 91.1 90.9 89.7 90.2  PLT 180 170 175 168 172   Blood Culture    Component Value Date/Time   SDES URINE, CATHETERIZED 08/01/2015 0635   SPECREQUEST NONE 08/01/2015 0635   CULT NO GROWTH 1 DAY 08/01/2015 0635   REPTSTATUS 08/02/2015 FINAL 08/01/2015 0635    Cardiac Enzymes: No results for input(s): CKTOTAL, CKMB, CKMBINDEX, TROPONINI in the last 168 hours. CBG:  Recent Labs Lab 08/05/15 0731 08/05/15 1255 08/05/15 1648 08/05/15 2006 08/06/15 0735  GLUCAP 85 107* 216* 142* 103*   Iron Studies: No results for input(s): IRON, TIBC, TRANSFERRIN, FERRITIN in the last 72 hours. @lablastinr3 @ Studies/Results: No results found. Medications: . 0.45 % NaCl with KCl 20 mEq / L 75 mL/hr at 08/04/15 2011   . amLODipine  10 mg Oral Daily  . antiseptic oral rinse  7 mL Mouth Rinse BID  . aspirin EC  81 mg Oral Daily  . atorvastatin  20 mg Oral Daily  . buPROPion  300 mg Oral Daily  . calcitRIOL  0.25 mcg Oral Daily  . calcium  acetate  667 mg Oral TID WC  . docusate sodium  100 mg Oral BID  . doxercalciferol  4 mcg Intravenous Q T,Th,Sa-HD  . DULoxetine  20 mg Oral Daily  . feeding supplement (NEPRO CARB STEADY)  237 mL Oral BID BM  . ferric gluconate (FERRLECIT/NULECIT) IV  62.5 mg Intravenous Q Sat-HD  . heparin  5,000 Units Subcutaneous 3 times per day  . insulin aspart  0-5 Units Subcutaneous QHS  . insulin aspart  0-9 Units Subcutaneous TID WC  . pantoprazole  40 mg Oral Daily  . piperacillin-tazobactam (ZOSYN)  IV  2.25 g Intravenous Q8H  . sevelamer carbonate  800 mg Oral TID WC    I have seen and examined this patient and agree with plan as outlined by Juanell Fairly, NP.  Stable for discharge per primary team and will f/u with outpatient HD on Friday due to holiday schedule. Wyndell Cardiff A,MD 08/06/2015 12:44 PM

## 2015-08-06 NOTE — Care Management Note (Signed)
Case Management Note  Patient Details  Name: Blake [name] MRN: [id number] Date of Birth: 1946/09/04  Subjective/Objective:      CM following for progression and d/c planning.              Action/Plan: 08/06/2015 Met with pt who is walking with Darco boot and today did a flight of steps, he plans to drive himself to hemodialysis which is within sight of his home. He is declining HH as he is independent at this time. Rolling walker in room and pt ready for d/c.   Expected Discharge Date:       08/06/2015           Expected Discharge Plan:  Home/Self Care  In-House Referral:  NA  Discharge planning Services  CM Consult  Post Acute Care Choice:  Durable Medical Equipment, Home Health Choice offered to:  Patient  DME Arranged:  Walker rolling DME Agency:  [place]:  NA HH Agency:  NA  Status of Service:  Completed, signed off  Medicare Important Message Given:  Yes Date Medicare IM Given:    Medicare IM give by:    Date Additional Medicare IM Given:    Additional Medicare Important Message give by:     If discussed at [place] of Stay Meetings, dates discussed:    Additional Comments:  Adron Bene, RN 08/06/2015, 10:35 AM

## 2015-08-06 NOTE — Progress Notes (Signed)
[name] to be D/C'd Home per MD order.  Discussed prescriptions and follow up appointments with the patient. Prescriptions given to patient, medication list explained in detail. Pt verbalized understanding.    Medication List    STOP taking these medications        furosemide 40 MG tablet  Commonly known as:  LASIX     NOVOLIN N RELION 100 UNIT/ML injection  Generic drug:  insulin NPH Human      TAKE these medications        allopurinol 100 MG tablet  Commonly known as:  ZYLOPRIM     amLODipine 10 MG tablet  Commonly known as:  NORVASC     aspirin EC 81 MG tablet  Take 81 mg by mouth daily.     atorvastatin 20 MG tablet  Commonly known as:  LIPITOR  Take 20 mg by mouth daily.     buPROPion 150 MG 24 hr tablet  Commonly known as:  WELLBUTRIN XL  Take 300 mg by mouth daily.     calcitRIOL 0.25 MCG capsule  Commonly known as:  ROCALTROL  Take 0.25 mcg by mouth daily.     calcium acetate 667 MG tablet  Commonly known as:  PHOSLO  Take 1 tablet by mouth 3 (three) times daily with meals.     ciprofloxacin 500 MG tablet  Commonly known as:  CIPRO  Take 1 tablet (500 mg total) by mouth daily with breakfast.     DULoxetine 20 MG capsule  Commonly known as:  CYMBALTA  Take 1 capsule (20 mg total) by mouth daily.     feeding supplement (NEPRO CARB STEADY) Liqd  Take 237 mLs by mouth 2 (two) times daily between meals.     multivitamin Tabs tablet  Take 1 tablet by mouth daily.     NOVOLIN R RELION 100 units/mL injection  Generic drug:  insulin regular  Inject 3-13 Units into the skin 3 (three) times daily with meals as needed for high blood sugar.     pantoprazole 40 MG tablet  Commonly known as:  PROTONIX  Take 1 tablet (40 mg total) by mouth daily.     SENSIPAR 30 MG tablet  Generic drug:  cinacalcet  Take 1 tablet by mouth daily.     sevelamer carbonate 800 MG tablet  Commonly known as:  RENVELA  Take 1 tablet (800 mg total) by mouth 3 (three)  times daily with meals.        Filed Vitals:   08/06/15 0422 08/06/15 1107  BP: 109/57 137/60  Pulse: 73   Temp: 98.4 F (36.9 C)   Resp: 20     Skin clean, dry and intact without evidence of skin break down, no evidence of skin tears noted. IV catheter discontinued intact. Site without signs and symptoms of complications. Dressing and pressure applied. Pt denies pain at this time. No complaints noted.  An After Visit Summary was printed and given to the patient. Patient escorted via [facility], and D/C home via private auto.  Carole Civil RN [facility] Phone [phone]

## 2015-08-08 DIAGNOSIS — D509 Iron deficiency anemia, unspecified: Secondary | ICD-10-CM | POA: Diagnosis not present

## 2015-08-08 DIAGNOSIS — D689 Coagulation defect, unspecified: Secondary | ICD-10-CM | POA: Diagnosis not present

## 2015-08-08 DIAGNOSIS — N186 End stage renal disease: Secondary | ICD-10-CM | POA: Diagnosis not present

## 2015-08-08 DIAGNOSIS — Z23 Encounter for immunization: Secondary | ICD-10-CM | POA: Diagnosis not present

## 2015-08-08 DIAGNOSIS — E1129 Type 2 diabetes mellitus with other diabetic kidney complication: Secondary | ICD-10-CM | POA: Diagnosis not present

## 2015-08-10 DIAGNOSIS — E1129 Type 2 diabetes mellitus with other diabetic kidney complication: Secondary | ICD-10-CM | POA: Diagnosis not present

## 2015-08-10 DIAGNOSIS — N186 End stage renal disease: Secondary | ICD-10-CM | POA: Diagnosis not present

## 2015-08-10 DIAGNOSIS — D509 Iron deficiency anemia, unspecified: Secondary | ICD-10-CM | POA: Diagnosis not present

## 2015-08-10 DIAGNOSIS — Z23 Encounter for immunization: Secondary | ICD-10-CM | POA: Diagnosis not present

## 2015-08-10 DIAGNOSIS — D689 Coagulation defect, unspecified: Secondary | ICD-10-CM | POA: Diagnosis not present

## 2015-08-12 DIAGNOSIS — Z23 Encounter for immunization: Secondary | ICD-10-CM | POA: Diagnosis not present

## 2015-08-12 DIAGNOSIS — D509 Iron deficiency anemia, unspecified: Secondary | ICD-10-CM | POA: Diagnosis not present

## 2015-08-12 DIAGNOSIS — E1129 Type 2 diabetes mellitus with other diabetic kidney complication: Secondary | ICD-10-CM | POA: Diagnosis not present

## 2015-08-12 DIAGNOSIS — N186 End stage renal disease: Secondary | ICD-10-CM | POA: Diagnosis not present

## 2015-08-12 DIAGNOSIS — D689 Coagulation defect, unspecified: Secondary | ICD-10-CM | POA: Diagnosis not present

## 2015-08-13 DIAGNOSIS — Z992 Dependence on renal dialysis: Secondary | ICD-10-CM | POA: Diagnosis not present

## 2015-08-13 DIAGNOSIS — E1122 Type 2 diabetes mellitus with diabetic chronic kidney disease: Secondary | ICD-10-CM | POA: Diagnosis not present

## 2015-08-13 DIAGNOSIS — N186 End stage renal disease: Secondary | ICD-10-CM | POA: Diagnosis not present

## 2015-08-14 DIAGNOSIS — D689 Coagulation defect, unspecified: Secondary | ICD-10-CM | POA: Diagnosis not present

## 2015-08-14 DIAGNOSIS — E1129 Type 2 diabetes mellitus with other diabetic kidney complication: Secondary | ICD-10-CM | POA: Diagnosis not present

## 2015-08-14 DIAGNOSIS — D509 Iron deficiency anemia, unspecified: Secondary | ICD-10-CM | POA: Diagnosis not present

## 2015-08-14 DIAGNOSIS — N186 End stage renal disease: Secondary | ICD-10-CM | POA: Diagnosis not present

## 2015-08-14 DIAGNOSIS — E875 Hyperkalemia: Secondary | ICD-10-CM | POA: Diagnosis not present

## 2015-08-16 DIAGNOSIS — D689 Coagulation defect, unspecified: Secondary | ICD-10-CM | POA: Diagnosis not present

## 2015-08-16 DIAGNOSIS — D509 Iron deficiency anemia, unspecified: Secondary | ICD-10-CM | POA: Diagnosis not present

## 2015-08-16 DIAGNOSIS — E1129 Type 2 diabetes mellitus with other diabetic kidney complication: Secondary | ICD-10-CM | POA: Diagnosis not present

## 2015-08-16 DIAGNOSIS — E875 Hyperkalemia: Secondary | ICD-10-CM | POA: Diagnosis not present

## 2015-08-16 DIAGNOSIS — N186 End stage renal disease: Secondary | ICD-10-CM | POA: Diagnosis not present

## 2015-08-19 DIAGNOSIS — D509 Iron deficiency anemia, unspecified: Secondary | ICD-10-CM | POA: Diagnosis not present

## 2015-08-19 DIAGNOSIS — E875 Hyperkalemia: Secondary | ICD-10-CM | POA: Diagnosis not present

## 2015-08-19 DIAGNOSIS — E1129 Type 2 diabetes mellitus with other diabetic kidney complication: Secondary | ICD-10-CM | POA: Diagnosis not present

## 2015-08-19 DIAGNOSIS — N186 End stage renal disease: Secondary | ICD-10-CM | POA: Diagnosis not present

## 2015-08-19 DIAGNOSIS — D689 Coagulation defect, unspecified: Secondary | ICD-10-CM | POA: Diagnosis not present

## 2015-08-21 DIAGNOSIS — E1129 Type 2 diabetes mellitus with other diabetic kidney complication: Secondary | ICD-10-CM | POA: Diagnosis not present

## 2015-08-21 DIAGNOSIS — D689 Coagulation defect, unspecified: Secondary | ICD-10-CM | POA: Diagnosis not present

## 2015-08-21 DIAGNOSIS — N186 End stage renal disease: Secondary | ICD-10-CM | POA: Diagnosis not present

## 2015-08-21 DIAGNOSIS — E875 Hyperkalemia: Secondary | ICD-10-CM | POA: Diagnosis not present

## 2015-08-21 DIAGNOSIS — D509 Iron deficiency anemia, unspecified: Secondary | ICD-10-CM | POA: Diagnosis not present

## 2015-08-23 DIAGNOSIS — E875 Hyperkalemia: Secondary | ICD-10-CM | POA: Diagnosis not present

## 2015-08-23 DIAGNOSIS — N186 End stage renal disease: Secondary | ICD-10-CM | POA: Diagnosis not present

## 2015-08-23 DIAGNOSIS — D689 Coagulation defect, unspecified: Secondary | ICD-10-CM | POA: Diagnosis not present

## 2015-08-23 DIAGNOSIS — E1129 Type 2 diabetes mellitus with other diabetic kidney complication: Secondary | ICD-10-CM | POA: Diagnosis not present

## 2015-08-23 DIAGNOSIS — D509 Iron deficiency anemia, unspecified: Secondary | ICD-10-CM | POA: Diagnosis not present

## 2015-08-26 DIAGNOSIS — D689 Coagulation defect, unspecified: Secondary | ICD-10-CM | POA: Diagnosis not present

## 2015-08-26 DIAGNOSIS — N186 End stage renal disease: Secondary | ICD-10-CM | POA: Diagnosis not present

## 2015-08-26 DIAGNOSIS — E1129 Type 2 diabetes mellitus with other diabetic kidney complication: Secondary | ICD-10-CM | POA: Diagnosis not present

## 2015-08-26 DIAGNOSIS — D509 Iron deficiency anemia, unspecified: Secondary | ICD-10-CM | POA: Diagnosis not present

## 2015-08-26 DIAGNOSIS — E875 Hyperkalemia: Secondary | ICD-10-CM | POA: Diagnosis not present

## 2015-08-28 DIAGNOSIS — N186 End stage renal disease: Secondary | ICD-10-CM | POA: Diagnosis not present

## 2015-08-28 DIAGNOSIS — D689 Coagulation defect, unspecified: Secondary | ICD-10-CM | POA: Diagnosis not present

## 2015-08-28 DIAGNOSIS — D509 Iron deficiency anemia, unspecified: Secondary | ICD-10-CM | POA: Diagnosis not present

## 2015-08-28 DIAGNOSIS — E875 Hyperkalemia: Secondary | ICD-10-CM | POA: Diagnosis not present

## 2015-08-28 DIAGNOSIS — E1129 Type 2 diabetes mellitus with other diabetic kidney complication: Secondary | ICD-10-CM | POA: Diagnosis not present

## 2015-08-30 DIAGNOSIS — E875 Hyperkalemia: Secondary | ICD-10-CM | POA: Diagnosis not present

## 2015-08-30 DIAGNOSIS — N186 End stage renal disease: Secondary | ICD-10-CM | POA: Diagnosis not present

## 2015-08-30 DIAGNOSIS — D689 Coagulation defect, unspecified: Secondary | ICD-10-CM | POA: Diagnosis not present

## 2015-08-30 DIAGNOSIS — E1129 Type 2 diabetes mellitus with other diabetic kidney complication: Secondary | ICD-10-CM | POA: Diagnosis not present

## 2015-08-30 DIAGNOSIS — D509 Iron deficiency anemia, unspecified: Secondary | ICD-10-CM | POA: Diagnosis not present

## 2015-09-02 DIAGNOSIS — D689 Coagulation defect, unspecified: Secondary | ICD-10-CM | POA: Diagnosis not present

## 2015-09-02 DIAGNOSIS — E875 Hyperkalemia: Secondary | ICD-10-CM | POA: Diagnosis not present

## 2015-09-02 DIAGNOSIS — E1129 Type 2 diabetes mellitus with other diabetic kidney complication: Secondary | ICD-10-CM | POA: Diagnosis not present

## 2015-09-02 DIAGNOSIS — D509 Iron deficiency anemia, unspecified: Secondary | ICD-10-CM | POA: Diagnosis not present

## 2015-09-02 DIAGNOSIS — N186 End stage renal disease: Secondary | ICD-10-CM | POA: Diagnosis not present

## 2015-09-04 DIAGNOSIS — D689 Coagulation defect, unspecified: Secondary | ICD-10-CM | POA: Diagnosis not present

## 2015-09-04 DIAGNOSIS — N186 End stage renal disease: Secondary | ICD-10-CM | POA: Diagnosis not present

## 2015-09-04 DIAGNOSIS — D509 Iron deficiency anemia, unspecified: Secondary | ICD-10-CM | POA: Diagnosis not present

## 2015-09-04 DIAGNOSIS — E875 Hyperkalemia: Secondary | ICD-10-CM | POA: Diagnosis not present

## 2015-09-04 DIAGNOSIS — E1129 Type 2 diabetes mellitus with other diabetic kidney complication: Secondary | ICD-10-CM | POA: Diagnosis not present

## 2015-09-09 DIAGNOSIS — E1129 Type 2 diabetes mellitus with other diabetic kidney complication: Secondary | ICD-10-CM | POA: Diagnosis not present

## 2015-09-09 DIAGNOSIS — N186 End stage renal disease: Secondary | ICD-10-CM | POA: Diagnosis not present

## 2015-09-09 DIAGNOSIS — D509 Iron deficiency anemia, unspecified: Secondary | ICD-10-CM | POA: Diagnosis not present

## 2015-09-09 DIAGNOSIS — D689 Coagulation defect, unspecified: Secondary | ICD-10-CM | POA: Diagnosis not present

## 2015-09-09 DIAGNOSIS — E875 Hyperkalemia: Secondary | ICD-10-CM | POA: Diagnosis not present

## 2015-09-11 DIAGNOSIS — N186 End stage renal disease: Secondary | ICD-10-CM | POA: Diagnosis not present

## 2015-09-11 DIAGNOSIS — E1129 Type 2 diabetes mellitus with other diabetic kidney complication: Secondary | ICD-10-CM | POA: Diagnosis not present

## 2015-09-11 DIAGNOSIS — D509 Iron deficiency anemia, unspecified: Secondary | ICD-10-CM | POA: Diagnosis not present

## 2015-09-11 DIAGNOSIS — E875 Hyperkalemia: Secondary | ICD-10-CM | POA: Diagnosis not present

## 2015-09-11 DIAGNOSIS — D689 Coagulation defect, unspecified: Secondary | ICD-10-CM | POA: Diagnosis not present

## 2015-09-13 ENCOUNTER — Emergency Department (HOSPITAL_COMMUNITY): Payer: Medicare Other

## 2015-09-13 ENCOUNTER — Encounter (HOSPITAL_COMMUNITY): Payer: Self-pay | Admitting: Emergency Medicine

## 2015-09-13 ENCOUNTER — Emergency Department (HOSPITAL_COMMUNITY)
Admission: EM | Admit: 2015-09-13 | Discharge: 2015-09-13 | Disposition: A | Discharge status: Home / Self Care | Payer: Medicare Other | Attending: Emergency Medicine | Admitting: Emergency Medicine

## 2015-09-13 DIAGNOSIS — R112 Nausea with vomiting, unspecified: Secondary | ICD-10-CM | POA: Insufficient documentation

## 2015-09-13 DIAGNOSIS — I120 Hypertensive chronic kidney disease with stage 5 chronic kidney disease or end stage renal disease: Secondary | ICD-10-CM | POA: Insufficient documentation

## 2015-09-13 DIAGNOSIS — Z992 Dependence on renal dialysis: Secondary | ICD-10-CM | POA: Diagnosis not present

## 2015-09-13 DIAGNOSIS — E785 Hyperlipidemia, unspecified: Secondary | ICD-10-CM | POA: Diagnosis not present

## 2015-09-13 DIAGNOSIS — D509 Iron deficiency anemia, unspecified: Secondary | ICD-10-CM | POA: Diagnosis not present

## 2015-09-13 DIAGNOSIS — Z79899 Other long term (current) drug therapy: Secondary | ICD-10-CM | POA: Diagnosis not present

## 2015-09-13 DIAGNOSIS — R0602 Shortness of breath: Secondary | ICD-10-CM | POA: Diagnosis not present

## 2015-09-13 DIAGNOSIS — Z87891 Personal history of nicotine dependence: Secondary | ICD-10-CM | POA: Insufficient documentation

## 2015-09-13 DIAGNOSIS — N186 End stage renal disease: Secondary | ICD-10-CM | POA: Insufficient documentation

## 2015-09-13 DIAGNOSIS — Z7982 Long term (current) use of aspirin: Secondary | ICD-10-CM | POA: Diagnosis not present

## 2015-09-13 DIAGNOSIS — Z792 Long term (current) use of antibiotics: Secondary | ICD-10-CM | POA: Diagnosis not present

## 2015-09-13 DIAGNOSIS — R0789 Other chest pain: Secondary | ICD-10-CM | POA: Insufficient documentation

## 2015-09-13 DIAGNOSIS — D689 Coagulation defect, unspecified: Secondary | ICD-10-CM | POA: Diagnosis not present

## 2015-09-13 DIAGNOSIS — R079 Chest pain, unspecified: Secondary | ICD-10-CM | POA: Diagnosis not present

## 2015-09-13 DIAGNOSIS — Z8739 Personal history of other diseases of the musculoskeletal system and connective tissue: Secondary | ICD-10-CM | POA: Diagnosis not present

## 2015-09-13 DIAGNOSIS — E875 Hyperkalemia: Secondary | ICD-10-CM | POA: Diagnosis not present

## 2015-09-13 DIAGNOSIS — E119 Type 2 diabetes mellitus without complications: Secondary | ICD-10-CM | POA: Insufficient documentation

## 2015-09-13 DIAGNOSIS — E1129 Type 2 diabetes mellitus with other diabetic kidney complication: Secondary | ICD-10-CM | POA: Diagnosis not present

## 2015-09-13 DIAGNOSIS — E1122 Type 2 diabetes mellitus with diabetic chronic kidney disease: Secondary | ICD-10-CM | POA: Diagnosis not present

## 2015-09-13 LAB — CBC
HEMATOCRIT: 38.8 % — AB (ref 39.0–52.0)
Hemoglobin: 12.1 g/dL — ABNORMAL LOW (ref 13.0–17.0)
MCH: 28.5 pg (ref 26.0–34.0)
MCHC: 31.2 g/dL (ref 30.0–36.0)
MCV: 91.3 fL (ref 78.0–100.0)
Platelets: 157 10*3/uL (ref 150–400)
RBC: 4.25 MIL/uL (ref 4.22–5.81)
RDW: 13.4 % (ref 11.5–15.5)
WBC: 7.4 10*3/uL (ref 4.0–10.5)

## 2015-09-13 LAB — BASIC METABOLIC PANEL
ANION GAP: 18 — AB (ref 5–15)
BUN: 33 mg/dL — ABNORMAL HIGH (ref 6–20)
CO2: 29 mmol/L (ref 22–32)
Calcium: 8 mg/dL — ABNORMAL LOW (ref 8.9–10.3)
Chloride: 97 mmol/L — ABNORMAL LOW (ref 101–111)
Creatinine, Ser: 9.24 mg/dL — ABNORMAL HIGH (ref 0.61–1.24)
GFR calc non Af Amer: 5 mL/min — ABNORMAL LOW (ref >60)
GFR, EST AFRICAN AMERICAN: 6 mL/min — AB (ref >60)
Glucose, Bld: 128 mg/dL — ABNORMAL HIGH (ref 65–99)
Potassium: 4.2 mmol/L (ref 3.5–5.1)
SODIUM: 144 mmol/L (ref 135–145)

## 2015-09-13 LAB — I-STAT TROPONIN, ED: Troponin i, poc: 0.02 ng/mL (ref 0.00–0.08)

## 2015-09-13 NOTE — ED Notes (Signed)
Per EMS, pt was on the way to dialysis this AM when he started to feel nauseous. Pt had 1 episode of emesis this AM. Pt reporting intermittent, burning sub-sternal chest pain for the past 6 months. Pt states that the pain started last night. Pt with hx of GERD and takes prilosec. Pt received 324 ASA en route. Pt is pain free at this time, and denies nausea.

## 2015-09-13 NOTE — ED Notes (Signed)
Per Angie at [facility] [phone]) pt can be dialyzed at 1230.  Pt made aware, states will have his niece take him.

## 2015-09-13 NOTE — Discharge Instructions (Signed)
It was our pleasure to provide your ER care today - we hope that you feel better.  Go to your dialyses center today as discussed.  For your intermittent nausea/vomiting and chest discomfort over the past 6 months, follow up with your doctor and/or cardiologist in the coming week - call office Monday to arrange appointment.   Return to ER right away if worse, persistent or recurrent chest pain, difficulty breathing, persistent vomiting, fevers, other concern.    Nausea and Vomiting Nausea is a sick feeling that often comes before throwing up (vomiting). Vomiting is a reflex where stomach contents come out of your mouth. Vomiting can cause severe loss of body fluids (dehydration). Children and elderly adults can become dehydrated quickly, especially if they also have diarrhea. Nausea and vomiting are symptoms of a condition or disease. It is important to find the cause of your symptoms. CAUSES   Direct irritation of the stomach lining. This irritation can result from increased acid production (gastroesophageal reflux disease), infection, food poisoning, taking certain medicines (such as nonsteroidal anti-inflammatory drugs), alcohol use, or tobacco use.  Signals from the brain.These signals could be caused by a headache, heat exposure, an inner ear disturbance, increased pressure in the brain from injury, infection, a tumor, or a concussion, pain, emotional stimulus, or metabolic problems.  An obstruction in the gastrointestinal tract (bowel obstruction).  Illnesses such as diabetes, hepatitis, gallbladder problems, appendicitis, kidney problems, cancer, sepsis, atypical symptoms of a heart attack, or eating disorders.  Medical treatments such as chemotherapy and radiation.  Receiving medicine that makes you sleep (general anesthetic) during surgery. DIAGNOSIS Your caregiver may ask for tests to be done if the problems do not improve after a few days. Tests may also be done if symptoms are  severe or if the reason for the nausea and vomiting is not clear. Tests may include:  Urine tests.  Blood tests.  Stool tests.  Cultures (to look for evidence of infection).  X-rays or other imaging studies. Test results can help your caregiver make decisions about treatment or the need for additional tests. TREATMENT You need to stay well hydrated. Drink frequently but in small amounts.You may wish to drink water, sports drinks, clear broth, or eat frozen ice pops or gelatin dessert to help stay hydrated.When you eat, eating slowly may help prevent nausea.There are also some antinausea medicines that may help prevent nausea. HOME CARE INSTRUCTIONS   Take all medicine as directed by your caregiver.  If you do not have an appetite, do not force yourself to eat. However, you must continue to drink fluids.  If you have an appetite, eat a normal diet unless your caregiver tells you differently.  Eat a variety of complex carbohydrates (rice, wheat, potatoes, bread), lean meats, yogurt, fruits, and vegetables.  Avoid high-fat foods because they are more difficult to digest.  Drink enough water and fluids to keep your urine clear or pale yellow.  If you are dehydrated, ask your caregiver for specific rehydration instructions. Signs of dehydration may include:  Severe thirst.  Dry lips and mouth.  Dizziness.  Dark urine.  Decreasing urine frequency and amount.  Confusion.  Rapid breathing or pulse. SEEK IMMEDIATE MEDICAL CARE IF:   You have blood or brown flecks (like coffee grounds) in your vomit.  You have black or bloody stools.  You have a severe headache or stiff neck.  You are confused.  You have severe abdominal pain.  You have chest pain or trouble breathing.  You  do not urinate at least once every 8 hours.  You develop cold or clammy skin.  You continue to vomit for longer than 24 to 48 hours.  You have a fever. MAKE SURE YOU:   Understand these  instructions.  Will watch your condition.  Will get help right away if you are not doing well or get worse.   This information is not intended to replace advice given to you by your health care provider. Make sure you discuss any questions you have with your health care provider.   Document Released: 08/30/2005 Document Revised: 11/22/2011 Document Reviewed: 01/27/2011 Elsevier Interactive Patient Education 2016 [employer]     Nonspecific Chest Pain  Chest pain can be caused by many different conditions. There is always a chance that your pain could be related to something serious, such as a heart attack or a blood clot in your lungs. Chest pain can also be caused by conditions that are not life-threatening. If you have chest pain, it is very important to follow up with your health care provider. CAUSES  Chest pain can be caused by:  Heartburn.  Pneumonia or bronchitis.  Anxiety or stress.  Inflammation around your heart (pericarditis) or lung (pleuritis or pleurisy).  A blood clot in your lung.  A collapsed lung (pneumothorax). It can develop suddenly on its own (spontaneous pneumothorax) or from trauma to the chest.  Shingles infection (varicella-zoster virus).  Heart attack.  Damage to the bones, muscles, and cartilage that make up your chest wall. This can include:  Bruised bones due to injury.  Strained muscles or cartilage due to frequent or repeated coughing or overwork.  Fracture to one or more ribs.  Sore cartilage due to inflammation (costochondritis). RISK FACTORS  Risk factors for chest pain may include:  Activities that increase your risk for trauma or injury to your chest.  Respiratory infections or conditions that cause frequent coughing.  Medical conditions or overeating that can cause heartburn.  Heart disease or family history of heart disease.  Conditions or health behaviors that increase your risk of developing a blood clot.  Having had  chicken pox (varicella zoster). SIGNS AND SYMPTOMS Chest pain can feel like:  Burning or tingling on the surface of your chest or deep in your chest.  Crushing, pressure, aching, or squeezing pain.  Dull or sharp pain that is worse when you move, cough, or take a deep breath.  Pain that is also felt in your back, neck, shoulder, or arm, or pain that spreads to any of these areas. Your chest pain may come and go, or it may stay constant. DIAGNOSIS Lab tests or other studies may be needed to find the cause of your pain. Your health care provider may have you take a test called an ambulatory ECG (electrocardiogram). An ECG records your heartbeat patterns at the time the test is performed. You may also have other tests, such as:  Transthoracic echocardiogram (TTE). During echocardiography, sound waves are used to create a picture of all of the heart structures and to look at how blood flows through your heart.  Transesophageal echocardiogram (TEE).This is a more advanced imaging test that obtains images from inside your body. It allows your health care provider to see your heart in finer detail.  Cardiac monitoring. This allows your health care provider to monitor your heart rate and rhythm in real time.  Holter monitor. This is a portable device that records your heartbeat and can help to diagnose abnormal heartbeats.  It allows your health care provider to track your heart activity for several days, if needed.  Stress tests. These can be done through exercise or by taking medicine that makes your heart beat more quickly.  Blood tests.  Imaging tests. TREATMENT  Your treatment depends on what is causing your chest pain. Treatment may include:  Medicines. These may include:  Acid blockers for heartburn.  Anti-inflammatory medicine.  Pain medicine for inflammatory conditions.  Antibiotic medicine, if an infection is present.  Medicines to dissolve blood clots.  Medicines to  treat coronary artery disease.  Supportive care for conditions that do not require medicines. This may include:  Resting.  Applying heat or cold packs to injured areas.  Limiting activities until pain decreases. HOME CARE INSTRUCTIONS  If you were prescribed an antibiotic medicine, finish it all even if you start to feel better.  Avoid any activities that bring on chest pain.  Do not use any tobacco products, including cigarettes, chewing tobacco, or electronic cigarettes. If you need help quitting, ask your health care provider.  Do not drink alcohol.  Take medicines only as directed by your health care provider.  Keep all follow-up visits as directed by your health care provider. This is important. This includes any further testing if your chest pain does not go away.  If heartburn is the cause for your chest pain, you may be told to keep your head raised (elevated) while sleeping. This reduces the chance that acid will go from your stomach into your esophagus.  Make lifestyle changes as directed by your health care provider. These may include:  Getting regular exercise. Ask your health care provider to suggest some activities that are safe for you.  Eating a heart-healthy diet. A registered dietitian can help you to learn healthy eating options.  Maintaining a healthy weight.  Managing diabetes, if necessary.  Reducing stress. SEEK MEDICAL CARE IF:  Your chest pain does not go away after treatment.  You have a rash with blisters on your chest.  You have a fever. SEEK IMMEDIATE MEDICAL CARE IF:   Your chest pain is worse.  You have an increasing cough, or you cough up blood.  You have severe abdominal pain.  You have severe weakness.  You faint.  You have chills.  You have sudden, unexplained chest discomfort.  You have sudden, unexplained discomfort in your arms, back, neck, or jaw.  You have shortness of breath at any time.  You suddenly start to  sweat, or your skin gets clammy.  You feel nauseous or you vomit.  You suddenly feel light-headed or dizzy.  Your heart begins to beat quickly, or it feels like it is skipping beats. These symptoms may represent a serious problem that is an emergency. Do not wait to see if the symptoms will go away. Get medical help right away. Call your local emergency services (911 in the [place] Do not drive yourself to the hospital.   This information is not intended to replace advice given to you by your health care provider. Make sure you discuss any questions you have with your health care provider.   Document Released: 06/09/2005 Document Revised: 09/20/2014 Document Reviewed: 04/05/2014 Elsevier Interactive Patient Education 2016 [employer].    Gastroesophageal Reflux Disease, Adult Normally, food travels down the esophagus and stays in the stomach to be digested. However, when a person has gastroesophageal reflux disease (GERD), food and stomach acid move back up into the esophagus. When this happens, the esophagus  becomes sore and inflamed. Over time, GERD can create small holes (ulcers) in the lining of the esophagus.  CAUSES This condition is caused by a problem with the muscle between the esophagus and the stomach (lower esophageal sphincter, or LES). Normally, the LES muscle closes after food passes through the esophagus to the stomach. When the LES is weakened or abnormal, it does not close properly, and that allows food and stomach acid to go back up into the esophagus. The LES can be weakened by certain dietary substances, medicines, and medical conditions, including:  Tobacco use.  Pregnancy.  Having a hiatal hernia.  Heavy alcohol use.  Certain foods and beverages, such as coffee, chocolate, onions, and peppermint. RISK FACTORS This condition is more likely to develop in:  People who have an increased body weight.  People who have connective tissue disorders.  People who use  NSAID medicines. SYMPTOMS Symptoms of this condition include:  Heartburn.  Difficult or painful swallowing.  The feeling of having a lump in the throat.  Abitter taste in the mouth.  Bad breath.  Having a large amount of saliva.  Having an upset or bloated stomach.  Belching.  Chest pain.  Shortness of breath or wheezing.  Ongoing (chronic) cough or a night-time cough.  Wearing away of tooth enamel.  Weight loss. Different conditions can cause chest pain. Make sure to see your health care provider if you experience chest pain. DIAGNOSIS Your health care provider will take a medical history and perform a physical exam. To determine if you have mild or severe GERD, your health care provider may also monitor how you respond to treatment. You may also have other tests, including:  An endoscopy toexamine your stomach and esophagus with a small camera.  A test thatmeasures the acidity level in your esophagus.  A test thatmeasures how much pressure is on your esophagus.  A barium swallow or modified barium swallow to show the shape, size, and functioning of your esophagus. TREATMENT The goal of treatment is to help relieve your symptoms and to prevent complications. Treatment for this condition may vary depending on how severe your symptoms are. Your health care provider may recommend:  Changes to your diet.  Medicine.  Surgery. HOME CARE INSTRUCTIONS Diet  Follow a diet as recommended by your health care provider. This may involve avoiding foods and drinks such as:  Coffee and tea (with or without caffeine).  Drinks that containalcohol.  Energy drinks and sports drinks.  Carbonated drinks or sodas.  Chocolate and cocoa.  Peppermint and mint flavorings.  Garlic and onions.  Horseradish.  Spicy and acidic foods, including peppers, chili powder, curry powder, vinegar, hot sauces, and barbecue sauce.  Citrus fruit juices and citrus fruits, such as  oranges, lemons, and limes.  Tomato-based foods, such as red sauce, chili, salsa, and pizza with red sauce.  Fried and fatty foods, such as donuts, french fries, potato chips, and high-fat dressings.  High-fat meats, such as hot dogs and fatty cuts of red and white meats, such as rib eye steak, sausage, ham, and bacon.  High-fat dairy items, such as whole milk, butter, and cream cheese.  Eat small, frequent meals instead of large meals.  Avoid drinking large amounts of liquid with your meals.  Avoid eating meals during the 2-3 hours before bedtime.  Avoid lying down right after you eat.  Do not exercise right after you eat. General Instructions  Pay attention to any changes in your symptoms.  Take over-the-counter  and prescription medicines only as told by your health care provider. Do not take aspirin, ibuprofen, or other NSAIDs unless your health care provider told you to do so.  Do not use any tobacco products, including cigarettes, chewing tobacco, and e-cigarettes. If you need help quitting, ask your health care provider.  Wear loose-fitting clothing. Do not wear anything tight around your waist that causes pressure on your abdomen.  Raise (elevate) the head of your bed 6 inches (15cm).  Try to reduce your stress, such as with yoga or meditation. If you need help reducing stress, ask your health care provider.  If you are overweight, reduce your weight to an amount that is healthy for you. Ask your health care provider for guidance about a safe weight loss goal.  Keep all follow-up visits as told by your health care provider. This is important. SEEK MEDICAL CARE IF:  You have new symptoms.  You have unexplained weight loss.  You have difficulty swallowing, or it hurts to swallow.  You have wheezing or a persistent cough.  Your symptoms do not improve with treatment.  You have a hoarse voice. SEEK IMMEDIATE MEDICAL CARE IF:  You have pain in your arms, neck,  jaw, teeth, or back.  You feel sweaty, dizzy, or light-headed.  You have chest pain or shortness of breath.  You vomit and your vomit looks like blood or coffee grounds.  You faint.  Your stool is bloody or black.  You cannot swallow, drink, or eat.   This information is not intended to replace advice given to you by your health care provider. Make sure you discuss any questions you have with your health care provider.   Document Released: 06/09/2005 Document Revised: 05/21/2015 Document Reviewed: 12/25/2014 Elsevier Interactive Patient Education [employer].

## 2015-09-13 NOTE — ED Provider Notes (Signed)
CSN: [id number]     Arrival date & time 09/13/15  0703 History   First MD Initiated Contact with Patient 09/13/15 [phone]     Chief Complaint  Patient presents with  . Chest Pain     (Consider location/radiation/quality/duration/timing/severity/associated sxs/prior Treatment) Patient is a 69 y.o. male presenting with chest pain. The history is provided by the patient.  Chest Pain Associated symptoms: nausea and vomiting   Associated symptoms: no abdominal pain, no back pain, no cough, no fever, no headache, no palpitations and no shortness of breath   Patient indicates went to HD center this AM (normal HD is T/Th/Sat at 0630).  Pt indicates went to center, had nausea, one episode emesis, and so came to ED.  Pt notes intermittent similar symptoms, also associated w chest pain, for the past 6 months.  States chest pain is indigestion, and will feel if something in throat/esophagus area, often then associated w nv. Episodes occur at rest, worse w lying flat, and worse after eats meats/meat dish. States ate hamburger helper last night and didn't feel well after. No exertional cp or discomfort. No unusual doe. No diaphoresis or sob. Denies cough or uri c/o. Hx gerd.  States after vomiting, now feels improved. No pain.  Notes above symptoms for 6 months, no acute or abrupt worsening.  Had normal hd 2 days ago. Denies personal hx cad. No fam hx premature cad. Father w mi in older age.       Past Medical History  Diagnosis Date  . Diabetes mellitus without complication ([place])   . Hypertension   . Hyperlipidemia   . Renal disorder   . Acute osteomyelitis of toe of right foot ([place]) 08/02/2015   Past Surgical History  Procedure Laterality Date  . Back surgery    . Insertion of dialysis catheter Left 07/05/2014    Procedure: INSERTION OF DIALYSIS CATHETER-LEFT INTERNAL JUGULAR PLACEMENT;  Surgeon: Conrad Seminole, MD;  Location: [place];  Service: Vascular;  Laterality: Left;  . Av fistula placement  Right 07/05/2014    Procedure: ARTERIOVENOUS BRACHIALCEPHALIC (AV) FISTULA CREATION;  Surgeon: Conrad Edwardsburg, MD;  Location: [place];  Service: Vascular;  Laterality: Right;  . Removal of a dialysis catheter Right 07/05/2014    Procedure: REMOVAL OF A DIALYSIS CATHETER;  Surgeon: Conrad [place], MD;  Location: [place];  Service: Vascular;  Laterality: Right;  . Amputation toe Right 08/04/2015    Procedure: RIGHT GREAT TOE AMPUTATION;  Surgeon: [name], MD;  Location: [place];  Service: Orthopedics;  Laterality: Right;   Family History  Problem Relation Age of Onset  . Diabetes Father   . Heart attack Father    Social History  Substance Use Topics  . Smoking status: Former Smoker -- 5 years    Types: Cigarettes    Quit date: 08/24/1979  . Smokeless tobacco: Never Used  . Alcohol Use: No    Review of Systems  Constitutional: Negative for fever and chills.  HENT: Negative for sore throat.   Eyes: Negative for redness.  Respiratory: Negative for cough and shortness of breath.   Cardiovascular: Positive for chest pain. Negative for palpitations and leg swelling.  Gastrointestinal: Positive for nausea and vomiting. Negative for abdominal pain, diarrhea and constipation.  Genitourinary: Negative for dysuria and flank pain.  Musculoskeletal: Negative for back pain and neck pain.  Skin: Negative for rash.  Neurological: Negative for headaches.  Hematological: Does not bruise/bleed easily.  Psychiatric/Behavioral: Negative for confusion.  Allergies  Review of patient's allergies indicates no known allergies.  Home Medications   Prior to Admission medications   Medication Sig Start Date End Date Taking? Authorizing Provider  allopurinol (ZYLOPRIM) 100 MG tablet  06/18/14   Historical Provider, MD  amLODipine (NORVASC) 10 MG tablet  06/18/14   Historical Provider, MD  aspirin EC 81 MG tablet Take 81 mg by mouth daily.    Historical Provider, MD  atorvastatin (LIPITOR) 20 MG tablet  Take 20 mg by mouth daily.    Historical Provider, MD  buPROPion (WELLBUTRIN XL) 150 MG 24 hr tablet Take 300 mg by mouth daily.    Historical Provider, MD  calcitRIOL (ROCALTROL) 0.25 MCG capsule Take 0.25 mcg by mouth daily.    Historical Provider, MD  calcium acetate (PHOSLO) 667 MG tablet Take 1 tablet by mouth 3 (three) times daily with meals. 08/23/14   Conrad Shallotte, MD  ciprofloxacin (CIPRO) 500 MG tablet Take 1 tablet (500 mg total) by mouth daily with breakfast. 08/06/15   Kelvin Cellar, MD  DULoxetine (CYMBALTA) 20 MG capsule Take 1 capsule (20 mg total) by mouth daily. 07/09/14   Silver Huguenin Elgergawy, MD  multivitamin (RENA-VIT) TABS tablet Take 1 tablet by mouth daily.    Historical Provider, MD  NOVOLIN R RELION 100 UNIT/ML injection Inject 3-13 Units into the skin 3 (three) times daily with meals as needed for high blood sugar.  06/13/14   Historical Provider, MD  Nutritional Supplements (FEEDING SUPPLEMENT, NEPRO CARB STEADY,) LIQD Take 237 mLs by mouth 2 (two) times daily between meals. 07/09/14   Silver Huguenin Elgergawy, MD  pantoprazole (PROTONIX) 40 MG tablet Take 1 tablet (40 mg total) by mouth daily. 07/09/14   Silver Huguenin Elgergawy, MD  SENSIPAR 30 MG tablet Take 1 tablet by mouth daily. 07/23/15   Historical Provider, MD  sevelamer carbonate (RENVELA) 800 MG tablet Take 1 tablet (800 mg total) by mouth 3 (three) times daily with meals. 08/23/14   Conrad Seguin, MD   BP 129/79 mmHg  Pulse 83  Resp 20  Ht 5\' 8"  (1.727 m)  Wt 89.359 kg  BMI 29.96 kg/m2  SpO2 100% Physical Exam  Constitutional: He is oriented to person, place, and time. He appears well-developed and well-nourished. No distress.  HENT:  Mouth/Throat: Oropharynx is clear and moist.  Eyes: Conjunctivae are normal. No scleral icterus.  Neck: Neck supple. No tracheal deviation present.  Cardiovascular: Normal rate, regular rhythm, normal heart sounds and intact distal pulses.  Exam reveals no gallop and no friction rub.    No murmur heard. Pulmonary/Chest: Effort normal and breath sounds normal. No accessory muscle usage. No respiratory distress.  Abdominal: Soft. Bowel sounds are normal. He exhibits no distension and no mass. There is no tenderness. There is no rebound and no guarding.  Genitourinary:  No cva tenderness  Musculoskeletal: Normal range of motion. He exhibits no edema or tenderness.  Right upper arm av fistula w thrill.   Neurological: He is alert and oriented to person, place, and time.  Skin: Skin is warm and dry. No rash noted. He is not diaphoretic.  Psychiatric: He has a normal mood and affect.  Nursing note and vitals reviewed.   ED Course  Procedures (including critical care time)   Results for orders placed or performed during the hospital encounter of [id number]  Basic metabolic panel  Result Value Ref Range   Sodium 144 135 - 145 mmol/L   Potassium 4.2 3.5 - 5.1  mmol/L   Chloride 97 (L) 101 - 111 mmol/L   CO2 29 22 - 32 mmol/L   Glucose, Bld 128 (H) 65 - 99 mg/dL   BUN 33 (H) 6 - 20 mg/dL   Creatinine, Ser 9.24 (H) 0.61 - 1.24 mg/dL   Calcium 8.0 (L) 8.9 - 10.3 mg/dL   GFR calc non Af Amer 5 (L) >60 mL/min   GFR calc Af Amer 6 (L) >60 mL/min   Anion gap 18 (H) 5 - 15  CBC  Result Value Ref Range   WBC 7.4 4.0 - 10.5 K/uL   RBC 4.25 4.22 - 5.81 MIL/uL   Hemoglobin 12.1 (L) 13.0 - 17.0 g/dL   HCT 38.8 (L) 39.0 - 52.0 %   MCV 91.3 78.0 - 100.0 fL   MCH 28.5 26.0 - 34.0 pg   MCHC 31.2 30.0 - 36.0 g/dL   RDW 13.4 11.5 - 15.5 %   Platelets 157 150 - 400 K/uL  I-stat troponin, ED (not at [facility], [facility])  Result Value Ref Range   Troponin i, poc 0.02 0.00 - 0.08 ng/mL   Comment 3           Dg Chest 2 View  09/13/2015  CLINICAL DATA:  Chest pain. EXAM: CHEST  2 VIEW COMPARISON:  08/01/2015 and 07/10/2014 FINDINGS: Heart size and pulmonary vascularity are normal. Slight chronic accentuation of the interstitial markings. No acute infiltrates or effusions. IMPRESSION: No active  cardiopulmonary disease. Electronically Signed   By: Lorriane Shire M.D.   On: 09/13/2015 07:52       I have personally reviewed and evaluated these images and lab results as part of my medical decision-making.  ED ECG REPORT   Date: 09/13/2015  Rate: 74  Rhythm: normal sinus rhythm  QRS Axis: normal  Intervals: normal  ST/T Wave abnormalities: normal  Conduction Disutrbances:none  Narrative Interpretation:   Old EKG Reviewed: unchanged  I have personally reviewed the EKG tracing   MDM   Iv ns.   Reviewed nursing notes and prior charts for additional history.   Pt denies any pain or nv currently.    Pt indicates for these symptoms over past 6 months, has been referred to gi but hasnt seen yet.  Pt continues to indicate no symptoms, and patient requests d/c, so that he can go to dialysis today.    Discussed with pts [facility] physician on call who indicates for [place] to call pts hd center - they indicate will dialyze at 1230 today.  Recheck pt, no c/o, eating/drinking, no nv.  Pt requests d/c.  Return precautions provided.      Lajean Saver, MD 09/13/15 [phone]

## 2015-09-16 DIAGNOSIS — M869 Osteomyelitis, unspecified: Secondary | ICD-10-CM | POA: Diagnosis not present

## 2015-09-16 DIAGNOSIS — L851 Acquired keratosis [keratoderma] palmaris et plantaris: Secondary | ICD-10-CM | POA: Diagnosis not present

## 2015-09-18 DIAGNOSIS — D509 Iron deficiency anemia, unspecified: Secondary | ICD-10-CM | POA: Diagnosis not present

## 2015-09-18 DIAGNOSIS — D689 Coagulation defect, unspecified: Secondary | ICD-10-CM | POA: Diagnosis not present

## 2015-09-18 DIAGNOSIS — E1129 Type 2 diabetes mellitus with other diabetic kidney complication: Secondary | ICD-10-CM | POA: Diagnosis not present

## 2015-09-18 DIAGNOSIS — N186 End stage renal disease: Secondary | ICD-10-CM | POA: Diagnosis not present

## 2015-09-22 DIAGNOSIS — E1129 Type 2 diabetes mellitus with other diabetic kidney complication: Secondary | ICD-10-CM | POA: Diagnosis not present

## 2015-09-22 DIAGNOSIS — N186 End stage renal disease: Secondary | ICD-10-CM | POA: Diagnosis not present

## 2015-09-22 DIAGNOSIS — D689 Coagulation defect, unspecified: Secondary | ICD-10-CM | POA: Diagnosis not present

## 2015-09-22 DIAGNOSIS — D509 Iron deficiency anemia, unspecified: Secondary | ICD-10-CM | POA: Diagnosis not present

## 2015-09-23 DIAGNOSIS — D509 Iron deficiency anemia, unspecified: Secondary | ICD-10-CM | POA: Diagnosis not present

## 2015-09-23 DIAGNOSIS — N186 End stage renal disease: Secondary | ICD-10-CM | POA: Diagnosis not present

## 2015-09-23 DIAGNOSIS — E1129 Type 2 diabetes mellitus with other diabetic kidney complication: Secondary | ICD-10-CM | POA: Diagnosis not present

## 2015-09-23 DIAGNOSIS — D689 Coagulation defect, unspecified: Secondary | ICD-10-CM | POA: Diagnosis not present

## 2015-09-25 DIAGNOSIS — D509 Iron deficiency anemia, unspecified: Secondary | ICD-10-CM | POA: Diagnosis not present

## 2015-09-25 DIAGNOSIS — D689 Coagulation defect, unspecified: Secondary | ICD-10-CM | POA: Diagnosis not present

## 2015-09-25 DIAGNOSIS — E1129 Type 2 diabetes mellitus with other diabetic kidney complication: Secondary | ICD-10-CM | POA: Diagnosis not present

## 2015-09-25 DIAGNOSIS — N186 End stage renal disease: Secondary | ICD-10-CM | POA: Diagnosis not present

## 2015-09-27 DIAGNOSIS — E1129 Type 2 diabetes mellitus with other diabetic kidney complication: Secondary | ICD-10-CM | POA: Diagnosis not present

## 2015-09-27 DIAGNOSIS — D689 Coagulation defect, unspecified: Secondary | ICD-10-CM | POA: Diagnosis not present

## 2015-09-27 DIAGNOSIS — N186 End stage renal disease: Secondary | ICD-10-CM | POA: Diagnosis not present

## 2015-09-27 DIAGNOSIS — D509 Iron deficiency anemia, unspecified: Secondary | ICD-10-CM | POA: Diagnosis not present

## 2015-09-29 ENCOUNTER — Encounter: Payer: Self-pay | Admitting: Gastroenterology

## 2015-09-30 DIAGNOSIS — D689 Coagulation defect, unspecified: Secondary | ICD-10-CM | POA: Diagnosis not present

## 2015-09-30 DIAGNOSIS — E1129 Type 2 diabetes mellitus with other diabetic kidney complication: Secondary | ICD-10-CM | POA: Diagnosis not present

## 2015-09-30 DIAGNOSIS — N186 End stage renal disease: Secondary | ICD-10-CM | POA: Diagnosis not present

## 2015-09-30 DIAGNOSIS — D509 Iron deficiency anemia, unspecified: Secondary | ICD-10-CM | POA: Diagnosis not present

## 2015-10-02 DIAGNOSIS — E1129 Type 2 diabetes mellitus with other diabetic kidney complication: Secondary | ICD-10-CM | POA: Diagnosis not present

## 2015-10-02 DIAGNOSIS — N186 End stage renal disease: Secondary | ICD-10-CM | POA: Diagnosis not present

## 2015-10-02 DIAGNOSIS — D689 Coagulation defect, unspecified: Secondary | ICD-10-CM | POA: Diagnosis not present

## 2015-10-02 DIAGNOSIS — D509 Iron deficiency anemia, unspecified: Secondary | ICD-10-CM | POA: Diagnosis not present

## 2015-10-04 DIAGNOSIS — E1129 Type 2 diabetes mellitus with other diabetic kidney complication: Secondary | ICD-10-CM | POA: Diagnosis not present

## 2015-10-04 DIAGNOSIS — N186 End stage renal disease: Secondary | ICD-10-CM | POA: Diagnosis not present

## 2015-10-04 DIAGNOSIS — D509 Iron deficiency anemia, unspecified: Secondary | ICD-10-CM | POA: Diagnosis not present

## 2015-10-04 DIAGNOSIS — D689 Coagulation defect, unspecified: Secondary | ICD-10-CM | POA: Diagnosis not present

## 2015-10-07 DIAGNOSIS — D689 Coagulation defect, unspecified: Secondary | ICD-10-CM | POA: Diagnosis not present

## 2015-10-07 DIAGNOSIS — D509 Iron deficiency anemia, unspecified: Secondary | ICD-10-CM | POA: Diagnosis not present

## 2015-10-07 DIAGNOSIS — E1129 Type 2 diabetes mellitus with other diabetic kidney complication: Secondary | ICD-10-CM | POA: Diagnosis not present

## 2015-10-07 DIAGNOSIS — N186 End stage renal disease: Secondary | ICD-10-CM | POA: Diagnosis not present

## 2015-10-09 DIAGNOSIS — D509 Iron deficiency anemia, unspecified: Secondary | ICD-10-CM | POA: Diagnosis not present

## 2015-10-09 DIAGNOSIS — E1129 Type 2 diabetes mellitus with other diabetic kidney complication: Secondary | ICD-10-CM | POA: Diagnosis not present

## 2015-10-09 DIAGNOSIS — N186 End stage renal disease: Secondary | ICD-10-CM | POA: Diagnosis not present

## 2015-10-09 DIAGNOSIS — D689 Coagulation defect, unspecified: Secondary | ICD-10-CM | POA: Diagnosis not present

## 2015-10-11 DIAGNOSIS — D689 Coagulation defect, unspecified: Secondary | ICD-10-CM | POA: Diagnosis not present

## 2015-10-11 DIAGNOSIS — D509 Iron deficiency anemia, unspecified: Secondary | ICD-10-CM | POA: Diagnosis not present

## 2015-10-11 DIAGNOSIS — N186 End stage renal disease: Secondary | ICD-10-CM | POA: Diagnosis not present

## 2015-10-11 DIAGNOSIS — E1129 Type 2 diabetes mellitus with other diabetic kidney complication: Secondary | ICD-10-CM | POA: Diagnosis not present

## 2015-10-14 DIAGNOSIS — E1122 Type 2 diabetes mellitus with diabetic chronic kidney disease: Secondary | ICD-10-CM | POA: Diagnosis not present

## 2015-10-14 DIAGNOSIS — E1129 Type 2 diabetes mellitus with other diabetic kidney complication: Secondary | ICD-10-CM | POA: Diagnosis not present

## 2015-10-14 DIAGNOSIS — N186 End stage renal disease: Secondary | ICD-10-CM | POA: Diagnosis not present

## 2015-10-14 DIAGNOSIS — Z992 Dependence on renal dialysis: Secondary | ICD-10-CM | POA: Diagnosis not present

## 2015-10-14 DIAGNOSIS — D689 Coagulation defect, unspecified: Secondary | ICD-10-CM | POA: Diagnosis not present

## 2015-10-14 DIAGNOSIS — D509 Iron deficiency anemia, unspecified: Secondary | ICD-10-CM | POA: Diagnosis not present

## 2015-10-16 DIAGNOSIS — D689 Coagulation defect, unspecified: Secondary | ICD-10-CM | POA: Diagnosis not present

## 2015-10-16 DIAGNOSIS — E1129 Type 2 diabetes mellitus with other diabetic kidney complication: Secondary | ICD-10-CM | POA: Diagnosis not present

## 2015-10-16 DIAGNOSIS — N2581 Secondary hyperparathyroidism of renal origin: Secondary | ICD-10-CM | POA: Diagnosis not present

## 2015-10-16 DIAGNOSIS — N186 End stage renal disease: Secondary | ICD-10-CM | POA: Diagnosis not present

## 2015-10-16 DIAGNOSIS — D509 Iron deficiency anemia, unspecified: Secondary | ICD-10-CM | POA: Diagnosis not present

## 2015-10-21 DIAGNOSIS — N2581 Secondary hyperparathyroidism of renal origin: Secondary | ICD-10-CM | POA: Diagnosis not present

## 2015-10-21 DIAGNOSIS — D689 Coagulation defect, unspecified: Secondary | ICD-10-CM | POA: Diagnosis not present

## 2015-10-21 DIAGNOSIS — N186 End stage renal disease: Secondary | ICD-10-CM | POA: Diagnosis not present

## 2015-10-21 DIAGNOSIS — D509 Iron deficiency anemia, unspecified: Secondary | ICD-10-CM | POA: Diagnosis not present

## 2015-10-21 DIAGNOSIS — E1129 Type 2 diabetes mellitus with other diabetic kidney complication: Secondary | ICD-10-CM | POA: Diagnosis not present

## 2015-10-23 DIAGNOSIS — D509 Iron deficiency anemia, unspecified: Secondary | ICD-10-CM | POA: Diagnosis not present

## 2015-10-23 DIAGNOSIS — N186 End stage renal disease: Secondary | ICD-10-CM | POA: Diagnosis not present

## 2015-10-23 DIAGNOSIS — D689 Coagulation defect, unspecified: Secondary | ICD-10-CM | POA: Diagnosis not present

## 2015-10-23 DIAGNOSIS — E1129 Type 2 diabetes mellitus with other diabetic kidney complication: Secondary | ICD-10-CM | POA: Diagnosis not present

## 2015-10-23 DIAGNOSIS — N2581 Secondary hyperparathyroidism of renal origin: Secondary | ICD-10-CM | POA: Diagnosis not present

## 2015-10-25 ENCOUNTER — Encounter (HOSPITAL_COMMUNITY): Payer: Self-pay | Admitting: Emergency Medicine

## 2015-10-25 ENCOUNTER — Emergency Department (HOSPITAL_COMMUNITY)
Admission: EM | Admit: 2015-10-25 | Discharge: 2015-10-25 | Disposition: A | Discharge status: Home / Self Care | Payer: Medicare Other | Attending: Emergency Medicine | Admitting: Emergency Medicine

## 2015-10-25 DIAGNOSIS — Z8739 Personal history of other diseases of the musculoskeletal system and connective tissue: Secondary | ICD-10-CM | POA: Diagnosis not present

## 2015-10-25 DIAGNOSIS — E162 Hypoglycemia, unspecified: Secondary | ICD-10-CM | POA: Diagnosis not present

## 2015-10-25 DIAGNOSIS — R1111 Vomiting without nausea: Secondary | ICD-10-CM | POA: Diagnosis not present

## 2015-10-25 DIAGNOSIS — E119 Type 2 diabetes mellitus without complications: Secondary | ICD-10-CM | POA: Diagnosis not present

## 2015-10-25 DIAGNOSIS — Z87448 Personal history of other diseases of urinary system: Secondary | ICD-10-CM | POA: Diagnosis not present

## 2015-10-25 DIAGNOSIS — N2581 Secondary hyperparathyroidism of renal origin: Secondary | ICD-10-CM | POA: Diagnosis not present

## 2015-10-25 DIAGNOSIS — N186 End stage renal disease: Secondary | ICD-10-CM | POA: Diagnosis not present

## 2015-10-25 DIAGNOSIS — E161 Other hypoglycemia: Secondary | ICD-10-CM | POA: Diagnosis not present

## 2015-10-25 DIAGNOSIS — R1110 Vomiting, unspecified: Secondary | ICD-10-CM | POA: Diagnosis present

## 2015-10-25 DIAGNOSIS — I10 Essential (primary) hypertension: Secondary | ICD-10-CM | POA: Insufficient documentation

## 2015-10-25 DIAGNOSIS — Z992 Dependence on renal dialysis: Secondary | ICD-10-CM | POA: Diagnosis not present

## 2015-10-25 DIAGNOSIS — K219 Gastro-esophageal reflux disease without esophagitis: Secondary | ICD-10-CM | POA: Insufficient documentation

## 2015-10-25 DIAGNOSIS — E1129 Type 2 diabetes mellitus with other diabetic kidney complication: Secondary | ICD-10-CM | POA: Diagnosis not present

## 2015-10-25 DIAGNOSIS — Z7982 Long term (current) use of aspirin: Secondary | ICD-10-CM | POA: Insufficient documentation

## 2015-10-25 DIAGNOSIS — Z87891 Personal history of nicotine dependence: Secondary | ICD-10-CM | POA: Diagnosis not present

## 2015-10-25 DIAGNOSIS — E785 Hyperlipidemia, unspecified: Secondary | ICD-10-CM | POA: Insufficient documentation

## 2015-10-25 DIAGNOSIS — D509 Iron deficiency anemia, unspecified: Secondary | ICD-10-CM | POA: Diagnosis not present

## 2015-10-25 DIAGNOSIS — D689 Coagulation defect, unspecified: Secondary | ICD-10-CM | POA: Diagnosis not present

## 2015-10-25 DIAGNOSIS — Z79899 Other long term (current) drug therapy: Secondary | ICD-10-CM | POA: Insufficient documentation

## 2015-10-25 LAB — COMPREHENSIVE METABOLIC PANEL
ALBUMIN: 3.5 g/dL (ref 3.5–5.0)
ALK PHOS: 102 U/L (ref 38–126)
ALT: 23 U/L (ref 17–63)
ANION GAP: 13 (ref 5–15)
AST: 30 U/L (ref 15–41)
BUN: 17 mg/dL (ref 6–20)
CALCIUM: 9.9 mg/dL (ref 8.9–10.3)
CO2: 30 mmol/L (ref 22–32)
Chloride: 95 mmol/L — ABNORMAL LOW (ref 101–111)
Creatinine, Ser: 4.5 mg/dL — ABNORMAL HIGH (ref 0.61–1.24)
GFR calc Af Amer: 14 mL/min — ABNORMAL LOW (ref >60)
GFR calc non Af Amer: 12 mL/min — ABNORMAL LOW (ref >60)
GLUCOSE: 120 mg/dL — AB (ref 65–99)
Potassium: 3.6 mmol/L (ref 3.5–5.1)
SODIUM: 138 mmol/L (ref 135–145)
Total Bilirubin: 0.6 mg/dL (ref 0.3–1.2)
Total Protein: 7.7 g/dL (ref 6.5–8.1)

## 2015-10-25 LAB — I-STAT TROPONIN, ED: TROPONIN I, POC: 0.01 ng/mL (ref 0.00–0.08)

## 2015-10-25 LAB — CBC
HCT: 40.1 % (ref 39.0–52.0)
HEMOGLOBIN: 13.1 g/dL (ref 13.0–17.0)
MCH: 29.6 pg (ref 26.0–34.0)
MCHC: 32.7 g/dL (ref 30.0–36.0)
MCV: 90.7 fL (ref 78.0–100.0)
Platelets: 198 10*3/uL (ref 150–400)
RBC: 4.42 MIL/uL (ref 4.22–5.81)
RDW: 13.1 % (ref 11.5–15.5)
WBC: 8.2 10*3/uL (ref 4.0–10.5)

## 2015-10-25 LAB — LIPASE, BLOOD: Lipase: 49 U/L (ref 11–51)

## 2015-10-25 MED ORDER — ONDANSETRON HCL 4 MG/2ML IJ SOLN
4.0000 mg | Freq: Once | INTRAMUSCULAR | Status: AC
  Administered 2015-10-25: 4 mg via INTRAVENOUS
  Filled 2015-10-25: qty 2

## 2015-10-25 MED ORDER — PANTOPRAZOLE SODIUM 20 MG PO TBEC: 20.0000 mg | DELAYED_RELEASE_TABLET | Freq: Every day | ORAL | Status: DC

## 2015-10-25 MED ORDER — ONDANSETRON 4 MG PO TBDP: 4.0000 mg | ORAL_TABLET | Freq: Once | ORAL | Status: DC

## 2015-10-25 MED ORDER — PANTOPRAZOLE SODIUM 40 MG IV SOLR
40.0000 mg | Freq: Once | INTRAVENOUS | Status: AC
  Administered 2015-10-25: 40 mg via INTRAVENOUS
  Filled 2015-10-25: qty 40

## 2015-10-25 NOTE — Discharge Instructions (Signed)
Gastroesophageal Reflux Disease, Adult Keep your follow-up appointment with their doctor for next month regarding GERD. Normally, food travels down the esophagus and stays in the stomach to be digested. If a person has gastroesophageal reflux disease (GERD), food and stomach acid move back up into the esophagus. When this happens, the esophagus becomes sore and swollen (inflamed). Over time, GERD can make small holes (ulcers) in the lining of the esophagus. HOME CARE Diet  Follow a diet as told by your doctor. You may need to avoid foods and drinks such as:  Coffee and tea (with or without caffeine).  Drinks that contain alcohol.  Energy drinks and sports drinks.  Carbonated drinks or sodas.  Chocolate and cocoa.  Peppermint and mint flavorings.  Garlic and onions.  Horseradish.  Spicy and acidic foods, such as peppers, chili powder, curry powder, vinegar, hot sauces, and BBQ sauce.  Citrus fruit juices and citrus fruits, such as oranges, lemons, and limes.  Tomato-based foods, such as red sauce, chili, salsa, and pizza with red sauce.  Fried and fatty foods, such as donuts, french fries, potato chips, and high-fat dressings.  High-fat meats, such as hot dogs, rib eye steak, sausage, ham, and bacon.  High-fat dairy items, such as whole milk, butter, and cream cheese.  Eat small meals often. Avoid eating large meals.  Avoid drinking large amounts of liquid with your meals.  Avoid eating meals during the 2-3 hours before bedtime.  Avoid lying down right after you eat.  Do not exercise right after you eat. General Instructions  Pay attention to any changes in your symptoms.  Take over-the-counter and prescription medicines only as told by your doctor. Do not take aspirin, ibuprofen, or other NSAIDs unless your doctor says it is okay.  Do not use any tobacco products, including cigarettes, chewing tobacco, and e-cigarettes. If you need help quitting, ask your  doctor.  Wear loose clothes. Do not wear anything tight around your waist.  Raise (elevate) the head of your bed about 6 inches (15 cm).  Try to lower your stress. If you need help doing this, ask your doctor.  If you are overweight, lose an amount of weight that is healthy for you. Ask your doctor about a safe weight loss goal.  Keep all follow-up visits as told by your doctor. This is important. GET HELP IF:  You have new symptoms.  You lose weight and you do not know why it is happening.  You have trouble swallowing, or it hurts to swallow.  You have wheezing or a cough that keeps happening.  Your symptoms do not get better with treatment.  You have a hoarse voice. GET HELP RIGHT AWAY IF:  You have pain in your arms, neck, jaw, teeth, or back.  You feel sweaty, dizzy, or light-headed.  You have chest pain or shortness of breath.  You throw up (vomit) and your throw up looks like blood or coffee grounds.  You pass out (faint).  Your poop (stool) is bloody or black.  You cannot swallow, drink, or eat.   This information is not intended to replace advice given to you by your health care provider. Make sure you discuss any questions you have with your health care provider.   Document Released: 02/16/2008 Document Revised: 05/21/2015 Document Reviewed: 12/25/2014 Elsevier Interactive Patient Education [employer].

## 2015-10-25 NOTE — ED Notes (Signed)
Pt arrives from dialysis center after completing dialysis reporting N/V/D intermittently x 1 week.  Pt reports "acid-reflux" pain, denies other GI pain.  Pt denies CP, NAD noted at this time, resp e/u.

## 2015-10-25 NOTE — ED Provider Notes (Signed)
CSN: [id number]     Arrival date & time 10/25/15  1155 History   First MD Initiated Contact with Patient 10/25/15 1210     Chief Complaint  Patient presents with  . Emesis  . Diarrhea   (Consider location/radiation/quality/duration/timing/severity/associated sxs/prior Treatment) Patient is a 70 y.o. male presenting with vomiting and diarrhea. The history is provided by the patient. No language interpreter was used.  Emesis Associated symptoms: diarrhea   Diarrhea Associated symptoms: vomiting     Mr. Ebanks is a 70 year old male with a history of diabetes, hypertension, hyperlipidemia, renal disorder who presents from dialysis Center reporting nausea, vomiting, and diarrhea intermittently times one week. Reports that he finished his dialysis. Normally has dialysis on Tuesday, Thursday, and Saturday. States this pain is similar to his previous acid reflux pain and points up and down from his epigastrum to his throat. Worse when lying supine. Relieved when sitting up. Took his Protonix last night with his meal. States he vomited once this morning before going to dialysis. Eyes any fever, chills, night sweats, shortness of breath, diaphoresis, chest pain.  Past Medical History  Diagnosis Date  . Diabetes mellitus without complication ([place])   . Hypertension   . Hyperlipidemia   . Renal disorder   . Acute osteomyelitis of toe of right foot ([place]) 08/02/2015   Past Surgical History  Procedure Laterality Date  . Back surgery    . Insertion of dialysis catheter Left 07/05/2014    Procedure: INSERTION OF DIALYSIS CATHETER-LEFT INTERNAL JUGULAR PLACEMENT;  Surgeon: Conrad Dawson, MD;  Location: [place];  Service: Vascular;  Laterality: Left;  . Av fistula placement Right 07/05/2014    Procedure: ARTERIOVENOUS BRACHIALCEPHALIC (AV) FISTULA CREATION;  Surgeon: Conrad Bacon, MD;  Location: [place];  Service: Vascular;  Laterality: Right;  . Removal of a dialysis catheter Right 07/05/2014   Procedure: REMOVAL OF A DIALYSIS CATHETER;  Surgeon: Conrad Overton, MD;  Location: [place];  Service: Vascular;  Laterality: Right;  . Amputation toe Right 08/04/2015    Procedure: RIGHT GREAT TOE AMPUTATION;  Surgeon: [name], MD;  Location: [place];  Service: Orthopedics;  Laterality: Right;   Family History  Problem Relation Age of Onset  . Diabetes Father   . Heart attack Father    Social History  Substance Use Topics  . Smoking status: Former Smoker -- 5 years    Types: Cigarettes    Quit date: 08/24/1979  . Smokeless tobacco: Never Used  . Alcohol Use: No    Review of Systems  Gastrointestinal: Positive for vomiting and diarrhea.  All other systems reviewed and are negative.     Allergies  Review of patient's allergies indicates no known allergies.  Home Medications   Prior to Admission medications   Medication Sig Start Date End Date Taking? Authorizing Provider  amLODipine (NORVASC) 10 MG tablet Take 10 mg by mouth daily.  06/18/14  Yes Historical Provider, MD  aspirin EC 81 MG tablet Take 81 mg by mouth daily.   Yes Historical Provider, MD  atorvastatin (LIPITOR) 20 MG tablet Take 20 mg by mouth daily.   Yes Historical Provider, MD  buPROPion (WELLBUTRIN XL) 150 MG 24 hr tablet Take 300 mg by mouth daily.   Yes Historical Provider, MD  DULoxetine (CYMBALTA) 20 MG capsule Take 1 capsule (20 mg total) by mouth daily. 07/09/14  Yes Albertine Patricia, MD  multivitamin (RENA-VIT) TABS tablet Take 1 tablet by mouth daily.   Yes Historical Provider,  MD  NOVOLIN R RELION 100 UNIT/ML injection Inject 3-13 Units into the skin 3 (three) times daily with meals as needed for high blood sugar.  06/13/14  Yes Historical Provider, MD  SENSIPAR 30 MG tablet Take 1 tablet by mouth daily. 07/23/15  Yes Historical Provider, MD  sevelamer carbonate (RENVELA) 800 MG tablet Take 1 tablet (800 mg total) by mouth 3 (three) times daily with meals. 08/23/14  Yes Conrad Wimauma, MD  pantoprazole  (PROTONIX) 20 MG tablet Take 1 tablet (20 mg total) by mouth daily. 10/25/15   Elby Blackwelder Patel-Mills, PA-C   BP 157/71 mmHg  Pulse 72  Temp(Src) 97.8 F (36.6 C) (Oral)  Resp 13  Ht 5\' 8"  (1.727 m)  Wt 87.5 kg  BMI 29.34 kg/m2  SpO2 97% Physical Exam  Constitutional: He is oriented to person, place, and time. He appears well-developed and well-nourished.  Well-appearing and in no acute distress. Chewing bubble gum.  HENT:  Head: Normocephalic and atraumatic.  Eyes: Conjunctivae are normal.  Neck: Normal range of motion. Neck supple.  Cardiovascular: Normal rate, regular rhythm and normal heart sounds.   RRR.  Pulmonary/Chest: Effort normal and breath sounds normal. No respiratory distress. He has no wheezes. He has no rales.  Lungs clear.  Abdominal: Soft. Normal appearance. He exhibits no distension. There is no tenderness. There is no rebound and no guarding.  No Reproducible epigastric tenderness. No abdominal tenderness.  Musculoskeletal: Normal range of motion.  Neurological: He is alert and oriented to person, place, and time.  Skin: Skin is warm and dry.  Nursing note and vitals reviewed.   ED Course  Procedures (including critical care time) Labs Review Labs Reviewed  COMPREHENSIVE METABOLIC PANEL - Abnormal; Notable for the following:    Chloride 95 (*)    Glucose, Bld 120 (*)    Creatinine, Ser 4.50 (*)    GFR calc non Af Amer 12 (*)    GFR calc Af Amer 14 (*)    All other components within normal limits  LIPASE, BLOOD  CBC  URINALYSIS, ROUTINE W REFLEX MICROSCOPIC (NOT AT [facility])  I-STAT TROPOININ, ED    Imaging Review No results found. I have personally reviewed and evaluated these images and lab results as part of my medical decision-making. ED ECG REPORT   Date: 10/25/2015  Rate: 79  Rhythm: normal sinus rhythm  QRS Axis: normal  Intervals: normal  ST/T Wave abnormalities: normal  Conduction Disutrbances:none  Narrative Interpretation:   Old EKG  Reviewed: unchanged  I have personally reviewed the EKG tracing and agree with the computerized printout as noted.   MDM   Final diagnoses:  Gastroesophageal reflux disease, esophagitis presence not specified   Patient presents for N/Vx1/ and multiple episodes of diarrhea throughout the week.  Was sent from dialysis center after he completed dialysis. States that his pain is similar to his acid reflux. Denies any chest pain or shortness of breath. Vital signs are stable. Patient is well-appearing and chewing gum. Troponin is negative. EKG is not concerning for ACS. Potassium is normal. Creatinine is 4.5. CBC normal. I believe the patient's pain is due to acid reflux. Medications  pantoprazole (PROTONIX) injection 40 mg (40 mg Intravenous Given 10/25/15 1259)  ondansetron (ZOFRAN) injection 4 mg (4 mg Intravenous Given 10/25/15 1259)   Patient states he feels better after treatment. He was given a prescription for pantoprazole because he only has a 30 day supply and I suggesting taking an extra 20 mg in the  morning until he follows up with his doctor in 3 weeks. Return precautions discussed and patient agrees with plan.      Ottie Glazier, PA-C 10/25/15 1535  Pattricia Boss, MD 10/26/15 [phone]

## 2015-10-28 DIAGNOSIS — D689 Coagulation defect, unspecified: Secondary | ICD-10-CM | POA: Diagnosis not present

## 2015-10-28 DIAGNOSIS — N186 End stage renal disease: Secondary | ICD-10-CM | POA: Diagnosis not present

## 2015-10-28 DIAGNOSIS — E1129 Type 2 diabetes mellitus with other diabetic kidney complication: Secondary | ICD-10-CM | POA: Diagnosis not present

## 2015-10-28 DIAGNOSIS — D509 Iron deficiency anemia, unspecified: Secondary | ICD-10-CM | POA: Diagnosis not present

## 2015-10-28 DIAGNOSIS — N2581 Secondary hyperparathyroidism of renal origin: Secondary | ICD-10-CM | POA: Diagnosis not present

## 2015-10-30 DIAGNOSIS — N186 End stage renal disease: Secondary | ICD-10-CM | POA: Diagnosis not present

## 2015-10-30 DIAGNOSIS — E1129 Type 2 diabetes mellitus with other diabetic kidney complication: Secondary | ICD-10-CM | POA: Diagnosis not present

## 2015-10-30 DIAGNOSIS — D689 Coagulation defect, unspecified: Secondary | ICD-10-CM | POA: Diagnosis not present

## 2015-10-30 DIAGNOSIS — D509 Iron deficiency anemia, unspecified: Secondary | ICD-10-CM | POA: Diagnosis not present

## 2015-10-30 DIAGNOSIS — N2581 Secondary hyperparathyroidism of renal origin: Secondary | ICD-10-CM | POA: Diagnosis not present

## 2015-11-01 DIAGNOSIS — D509 Iron deficiency anemia, unspecified: Secondary | ICD-10-CM | POA: Diagnosis not present

## 2015-11-01 DIAGNOSIS — E1129 Type 2 diabetes mellitus with other diabetic kidney complication: Secondary | ICD-10-CM | POA: Diagnosis not present

## 2015-11-01 DIAGNOSIS — N186 End stage renal disease: Secondary | ICD-10-CM | POA: Diagnosis not present

## 2015-11-01 DIAGNOSIS — N2581 Secondary hyperparathyroidism of renal origin: Secondary | ICD-10-CM | POA: Diagnosis not present

## 2015-11-01 DIAGNOSIS — D689 Coagulation defect, unspecified: Secondary | ICD-10-CM | POA: Diagnosis not present

## 2015-11-06 DIAGNOSIS — E1129 Type 2 diabetes mellitus with other diabetic kidney complication: Secondary | ICD-10-CM | POA: Diagnosis not present

## 2015-11-06 DIAGNOSIS — D509 Iron deficiency anemia, unspecified: Secondary | ICD-10-CM | POA: Diagnosis not present

## 2015-11-06 DIAGNOSIS — N186 End stage renal disease: Secondary | ICD-10-CM | POA: Diagnosis not present

## 2015-11-06 DIAGNOSIS — D689 Coagulation defect, unspecified: Secondary | ICD-10-CM | POA: Diagnosis not present

## 2015-11-06 DIAGNOSIS — N2581 Secondary hyperparathyroidism of renal origin: Secondary | ICD-10-CM | POA: Diagnosis not present

## 2015-11-08 DIAGNOSIS — D689 Coagulation defect, unspecified: Secondary | ICD-10-CM | POA: Diagnosis not present

## 2015-11-08 DIAGNOSIS — E1129 Type 2 diabetes mellitus with other diabetic kidney complication: Secondary | ICD-10-CM | POA: Diagnosis not present

## 2015-11-08 DIAGNOSIS — N186 End stage renal disease: Secondary | ICD-10-CM | POA: Diagnosis not present

## 2015-11-08 DIAGNOSIS — N2581 Secondary hyperparathyroidism of renal origin: Secondary | ICD-10-CM | POA: Diagnosis not present

## 2015-11-08 DIAGNOSIS — D509 Iron deficiency anemia, unspecified: Secondary | ICD-10-CM | POA: Diagnosis not present

## 2015-11-11 DIAGNOSIS — N2581 Secondary hyperparathyroidism of renal origin: Secondary | ICD-10-CM | POA: Diagnosis not present

## 2015-11-11 DIAGNOSIS — N186 End stage renal disease: Secondary | ICD-10-CM | POA: Diagnosis not present

## 2015-11-11 DIAGNOSIS — D689 Coagulation defect, unspecified: Secondary | ICD-10-CM | POA: Diagnosis not present

## 2015-11-11 DIAGNOSIS — D509 Iron deficiency anemia, unspecified: Secondary | ICD-10-CM | POA: Diagnosis not present

## 2015-11-11 DIAGNOSIS — E1122 Type 2 diabetes mellitus with diabetic chronic kidney disease: Secondary | ICD-10-CM | POA: Diagnosis not present

## 2015-11-11 DIAGNOSIS — Z992 Dependence on renal dialysis: Secondary | ICD-10-CM | POA: Diagnosis not present

## 2015-11-11 DIAGNOSIS — E1129 Type 2 diabetes mellitus with other diabetic kidney complication: Secondary | ICD-10-CM | POA: Diagnosis not present

## 2015-11-12 ENCOUNTER — Ambulatory Visit (INDEPENDENT_AMBULATORY_CARE_PROVIDER_SITE_OTHER): Payer: Medicare Other | Admitting: Gastroenterology

## 2015-11-12 ENCOUNTER — Encounter: Payer: Self-pay | Admitting: Gastroenterology

## 2015-11-12 VITALS — BP 138/70 | HR 97 | Ht 68.0 in | Wt 195.0 lb

## 2015-11-12 DIAGNOSIS — R112 Nausea with vomiting, unspecified: Secondary | ICD-10-CM

## 2015-11-12 DIAGNOSIS — R1319 Other dysphagia: Secondary | ICD-10-CM

## 2015-11-12 NOTE — Patient Instructions (Addendum)
EGD at [facility] with MAC, later in day (1 or 2 pm start to allow time for HD in AM) for vomiting.

## 2015-11-12 NOTE — Progress Notes (Signed)
HPI: This is a very pleasant 70 year old man  who was referred to me by [name], MD  to evaluate  GERD  Chief complaint is intermittent vomiting  He has intermittent vomiting.  Will occur randomly, not even once per week.  Gets a pain in epigastrium, then nausea, then vomits.  Non bloody.  Usually when laying down. Has been a problem for about a year.  Takes 81mg  ASA daily.  He used to get pyrosis, especially when heavier.   Has lost 50 pounds in a years.  He started protonix after ER visit this past week or so.  Mild dysphagia to solids.    Review of systems: Pertinent positive and negative review of systems were noted in the above HPI section. Complete review of systems was performed and was otherwise normal.   Past Medical History  Diagnosis Date  . Diabetes mellitus without complication ([place])   . Hypertension   . Hyperlipidemia   . Renal disorder   . Acute osteomyelitis of toe of right foot ([place]) 08/02/2015    Past Surgical History  Procedure Laterality Date  . Back surgery    . Insertion of dialysis catheter Left 07/05/2014    Procedure: INSERTION OF DIALYSIS CATHETER-LEFT INTERNAL JUGULAR PLACEMENT;  Surgeon: Conrad [place], MD;  Location: [place];  Service: Vascular;  Laterality: Left;  . Av fistula placement Right 07/05/2014    Procedure: ARTERIOVENOUS BRACHIALCEPHALIC (AV) FISTULA CREATION;  Surgeon: Conrad [place], MD;  Location: [place];  Service: Vascular;  Laterality: Right;  . Removal of a dialysis catheter Right 07/05/2014    Procedure: REMOVAL OF A DIALYSIS CATHETER;  Surgeon: Conrad Warren, MD;  Location: [place];  Service: Vascular;  Laterality: Right;  . Amputation toe Right 08/04/2015    Procedure: RIGHT GREAT TOE AMPUTATION;  Surgeon: [name], MD;  Location: [place];  Service: Orthopedics;  Laterality: Right;    Current Outpatient Prescriptions  Medication Sig Dispense Refill  . amLODipine (NORVASC) 10 MG tablet Take 10 mg by mouth daily.     Marland Kitchen  aspirin EC 81 MG tablet Take 81 mg by mouth daily.    Marland Kitchen atorvastatin (LIPITOR) 20 MG tablet Take 20 mg by mouth daily.    Marland Kitchen buPROPion (WELLBUTRIN XL) 150 MG 24 hr tablet Take 300 mg by mouth daily.    . DULoxetine (CYMBALTA) 20 MG capsule Take 1 capsule (20 mg total) by mouth daily.  3  . multivitamin (RENA-VIT) TABS tablet Take 1 tablet by mouth daily.    Marland Kitchen NOVOLIN R RELION 100 UNIT/ML injection Inject 3-13 Units into the skin 3 (three) times daily with meals as needed for high blood sugar.     . pantoprazole (PROTONIX) 20 MG tablet Take 1 tablet (20 mg total) by mouth daily. 60 tablet 0  . SENSIPAR 30 MG tablet Take 1 tablet by mouth daily.    . sevelamer carbonate (RENVELA) 800 MG tablet Take 1 tablet (800 mg total) by mouth 3 (three) times daily with meals. 90 tablet 11   No current facility-administered medications for this visit.    Allergies as of 11/12/2015  . (No Known Allergies)    Family History  Problem Relation Age of Onset  . Diabetes Father   . Heart attack Father     Social History   Social History  . Marital Status: Single    Spouse Name: N/A  . Number of Children: N/A  . Years of Education: N/A   Occupational History  .  Not on file.   Social History Main Topics  . Smoking status: Former Smoker -- 5 years    Types: Cigarettes    Quit date: 08/24/1979  . Smokeless tobacco: Never Used  . Alcohol Use: No  . Drug Use: No  . Sexual Activity: Not on file   Other Topics Concern  . Not on file   Social History Narrative     Physical Exam: BP 138/70 mmHg  Pulse 97  Ht 5\' 8"  (1.727 m)  Wt 195 lb (88.451 kg)  BMI 29.66 kg/m2 Constitutional: generally well-appearing Psychiatric: alert and oriented x3 Eyes: extraocular movements intact Mouth: oral pharynx moist, no lesions Neck: supple no lymphadenopathy Cardiovascular: heart regular rate and rhythm Lungs: clear to auscultation bilaterally Abdomen: soft, nontender, nondistended, no obvious ascites,  no peritoneal signs, normal bowel sounds Extremities: no lower extremity edema bilaterally Skin: no lesions on visible extremities   Assessment and plan: 70 y.o. male with  Intermittent vomiting, intermittent dysphasia, significant weight loss  He vomits occasionally, usually this is while laying down or just after getting up around dialysis time. It is not occurring very often. He does not vomit up any blood. He has no other significant GI symptoms but does have some abdominal discomfort just before the vomiting. He was started on proton pump inhibitor last week and I recommended he continue on that. We'll proceed with EGD at his soonest convenience for the intermittent vomiting. This is to look for peptic ulcer disease, H. Pylori, gastritis. He is also lost a lot of weight 50 pounds in the past year and does have some minor dysphagia symptoms as well.   Owens Loffler, MD [facility] 11/12/2015, 11:14 AM  Cc: [name], MD

## 2015-11-13 DIAGNOSIS — D689 Coagulation defect, unspecified: Secondary | ICD-10-CM | POA: Diagnosis not present

## 2015-11-13 DIAGNOSIS — D509 Iron deficiency anemia, unspecified: Secondary | ICD-10-CM | POA: Diagnosis not present

## 2015-11-13 DIAGNOSIS — E1129 Type 2 diabetes mellitus with other diabetic kidney complication: Secondary | ICD-10-CM | POA: Diagnosis not present

## 2015-11-13 DIAGNOSIS — N186 End stage renal disease: Secondary | ICD-10-CM | POA: Diagnosis not present

## 2015-11-13 DIAGNOSIS — N2581 Secondary hyperparathyroidism of renal origin: Secondary | ICD-10-CM | POA: Diagnosis not present

## 2015-11-15 DIAGNOSIS — N186 End stage renal disease: Secondary | ICD-10-CM | POA: Diagnosis not present

## 2015-11-15 DIAGNOSIS — E1129 Type 2 diabetes mellitus with other diabetic kidney complication: Secondary | ICD-10-CM | POA: Diagnosis not present

## 2015-11-15 DIAGNOSIS — D689 Coagulation defect, unspecified: Secondary | ICD-10-CM | POA: Diagnosis not present

## 2015-11-15 DIAGNOSIS — D509 Iron deficiency anemia, unspecified: Secondary | ICD-10-CM | POA: Diagnosis not present

## 2015-11-15 DIAGNOSIS — N2581 Secondary hyperparathyroidism of renal origin: Secondary | ICD-10-CM | POA: Diagnosis not present

## 2015-11-18 DIAGNOSIS — D689 Coagulation defect, unspecified: Secondary | ICD-10-CM | POA: Diagnosis not present

## 2015-11-18 DIAGNOSIS — N2581 Secondary hyperparathyroidism of renal origin: Secondary | ICD-10-CM | POA: Diagnosis not present

## 2015-11-18 DIAGNOSIS — D509 Iron deficiency anemia, unspecified: Secondary | ICD-10-CM | POA: Diagnosis not present

## 2015-11-18 DIAGNOSIS — E1129 Type 2 diabetes mellitus with other diabetic kidney complication: Secondary | ICD-10-CM | POA: Diagnosis not present

## 2015-11-18 DIAGNOSIS — N186 End stage renal disease: Secondary | ICD-10-CM | POA: Diagnosis not present

## 2015-11-20 DIAGNOSIS — E1129 Type 2 diabetes mellitus with other diabetic kidney complication: Secondary | ICD-10-CM | POA: Diagnosis not present

## 2015-11-20 DIAGNOSIS — D689 Coagulation defect, unspecified: Secondary | ICD-10-CM | POA: Diagnosis not present

## 2015-11-20 DIAGNOSIS — N186 End stage renal disease: Secondary | ICD-10-CM | POA: Diagnosis not present

## 2015-11-20 DIAGNOSIS — D509 Iron deficiency anemia, unspecified: Secondary | ICD-10-CM | POA: Diagnosis not present

## 2015-11-20 DIAGNOSIS — N2581 Secondary hyperparathyroidism of renal origin: Secondary | ICD-10-CM | POA: Diagnosis not present

## 2015-11-21 ENCOUNTER — Encounter (HOSPITAL_COMMUNITY): Payer: Self-pay | Admitting: *Deleted

## 2015-11-22 DIAGNOSIS — D689 Coagulation defect, unspecified: Secondary | ICD-10-CM | POA: Diagnosis not present

## 2015-11-22 DIAGNOSIS — N2581 Secondary hyperparathyroidism of renal origin: Secondary | ICD-10-CM | POA: Diagnosis not present

## 2015-11-22 DIAGNOSIS — N186 End stage renal disease: Secondary | ICD-10-CM | POA: Diagnosis not present

## 2015-11-22 DIAGNOSIS — E1129 Type 2 diabetes mellitus with other diabetic kidney complication: Secondary | ICD-10-CM | POA: Diagnosis not present

## 2015-11-22 DIAGNOSIS — D509 Iron deficiency anemia, unspecified: Secondary | ICD-10-CM | POA: Diagnosis not present

## 2015-11-25 DIAGNOSIS — D509 Iron deficiency anemia, unspecified: Secondary | ICD-10-CM | POA: Diagnosis not present

## 2015-11-25 DIAGNOSIS — E1129 Type 2 diabetes mellitus with other diabetic kidney complication: Secondary | ICD-10-CM | POA: Diagnosis not present

## 2015-11-25 DIAGNOSIS — N186 End stage renal disease: Secondary | ICD-10-CM | POA: Diagnosis not present

## 2015-11-25 DIAGNOSIS — D689 Coagulation defect, unspecified: Secondary | ICD-10-CM | POA: Diagnosis not present

## 2015-11-25 DIAGNOSIS — N2581 Secondary hyperparathyroidism of renal origin: Secondary | ICD-10-CM | POA: Diagnosis not present

## 2015-11-28 ENCOUNTER — Ambulatory Visit (HOSPITAL_COMMUNITY): Payer: Medicare Other | Admitting: Certified Registered"

## 2015-11-28 ENCOUNTER — Encounter (HOSPITAL_COMMUNITY)
Admission: RE | Disposition: A | Discharge status: Home / Self Care | Payer: Self-pay | Source: Ambulatory Visit | Attending: Gastroenterology

## 2015-11-28 ENCOUNTER — Ambulatory Visit (HOSPITAL_COMMUNITY)
Admission: RE | Admit: 2015-11-28 | Discharge: 2015-11-28 | Disposition: A | Discharge status: Home / Self Care | Payer: Medicare Other | Source: Ambulatory Visit | Attending: Gastroenterology | Admitting: Gastroenterology

## 2015-11-28 ENCOUNTER — Encounter (HOSPITAL_COMMUNITY): Payer: Self-pay | Admitting: *Deleted

## 2015-11-28 DIAGNOSIS — I10 Essential (primary) hypertension: Secondary | ICD-10-CM | POA: Diagnosis not present

## 2015-11-28 DIAGNOSIS — Z7982 Long term (current) use of aspirin: Secondary | ICD-10-CM | POA: Insufficient documentation

## 2015-11-28 DIAGNOSIS — K319 Disease of stomach and duodenum, unspecified: Secondary | ICD-10-CM | POA: Insufficient documentation

## 2015-11-28 DIAGNOSIS — Z794 Long term (current) use of insulin: Secondary | ICD-10-CM | POA: Diagnosis not present

## 2015-11-28 DIAGNOSIS — R1310 Dysphagia, unspecified: Secondary | ICD-10-CM | POA: Diagnosis not present

## 2015-11-28 DIAGNOSIS — E119 Type 2 diabetes mellitus without complications: Secondary | ICD-10-CM | POA: Insufficient documentation

## 2015-11-28 DIAGNOSIS — K2970 Gastritis, unspecified, without bleeding: Secondary | ICD-10-CM

## 2015-11-28 DIAGNOSIS — F329 Major depressive disorder, single episode, unspecified: Secondary | ICD-10-CM | POA: Diagnosis not present

## 2015-11-28 DIAGNOSIS — R1319 Other dysphagia: Secondary | ICD-10-CM

## 2015-11-28 DIAGNOSIS — K449 Diaphragmatic hernia without obstruction or gangrene: Secondary | ICD-10-CM | POA: Diagnosis not present

## 2015-11-28 DIAGNOSIS — Z992 Dependence on renal dialysis: Secondary | ICD-10-CM | POA: Insufficient documentation

## 2015-11-28 DIAGNOSIS — E785 Hyperlipidemia, unspecified: Secondary | ICD-10-CM | POA: Insufficient documentation

## 2015-11-28 DIAGNOSIS — Z87891 Personal history of nicotine dependence: Secondary | ICD-10-CM | POA: Diagnosis not present

## 2015-11-28 DIAGNOSIS — R112 Nausea with vomiting, unspecified: Secondary | ICD-10-CM | POA: Diagnosis not present

## 2015-11-28 DIAGNOSIS — Z89411 Acquired absence of right great toe: Secondary | ICD-10-CM | POA: Insufficient documentation

## 2015-11-28 DIAGNOSIS — I120 Hypertensive chronic kidney disease with stage 5 chronic kidney disease or end stage renal disease: Secondary | ICD-10-CM | POA: Diagnosis not present

## 2015-11-28 DIAGNOSIS — Z79899 Other long term (current) drug therapy: Secondary | ICD-10-CM | POA: Insufficient documentation

## 2015-11-28 DIAGNOSIS — K3189 Other diseases of stomach and duodenum: Secondary | ICD-10-CM | POA: Diagnosis not present

## 2015-11-28 DIAGNOSIS — N186 End stage renal disease: Secondary | ICD-10-CM | POA: Diagnosis not present

## 2015-11-28 DIAGNOSIS — K219 Gastro-esophageal reflux disease without esophagitis: Secondary | ICD-10-CM | POA: Diagnosis not present

## 2015-11-28 HISTORY — PX: ESOPHAGOGASTRODUODENOSCOPY (EGD) WITH PROPOFOL: SHX5813

## 2015-11-28 LAB — GLUCOSE, CAPILLARY
GLUCOSE-CAPILLARY: 125 mg/dL — AB (ref 65–99)
GLUCOSE-CAPILLARY: 120 mg/dL — AB (ref 65–99)

## 2015-11-28 SURGERY — ESOPHAGOGASTRODUODENOSCOPY (EGD) WITH PROPOFOL
Anesthesia: Monitor Anesthesia Care

## 2015-11-28 MED ORDER — PROPOFOL 10 MG/ML IV BOLUS
INTRAVENOUS | Status: AC
  Filled 2015-11-28: qty 40

## 2015-11-28 MED ORDER — SODIUM CHLORIDE 0.9 % IV SOLN
INTRAVENOUS | Status: DC
  Administered 2015-11-28: 1000 mL via INTRAVENOUS

## 2015-11-28 MED ORDER — PROPOFOL 10 MG/ML IV BOLUS
INTRAVENOUS | Status: DC | PRN
  Administered 2015-11-28: 20 mg via INTRAVENOUS
  Administered 2015-11-28: 90 mg via INTRAVENOUS

## 2015-11-28 MED ORDER — LIDOCAINE HCL (CARDIAC) 20 MG/ML IV SOLN
INTRAVENOUS | Status: AC
  Filled 2015-11-28: qty 5

## 2015-11-28 MED ORDER — LIDOCAINE HCL (CARDIAC) 20 MG/ML IV SOLN
INTRAVENOUS | Status: DC | PRN
  Administered 2015-11-28 (×2): 20 mg via INTRAVENOUS
  Administered 2015-11-28: 60 mg via INTRAVENOUS

## 2015-11-28 SURGICAL SUPPLY — 14 items

## 2015-11-28 NOTE — Transfer of Care (Signed)
Immediate Anesthesia Transfer of Care Note  Patient: Blake [name]  Procedure(s) Performed: Procedure(s): ESOPHAGOGASTRODUODENOSCOPY (EGD) WITH PROPOFOL (N/A)  Patient Location: PACU  Anesthesia Type:MAC  Level of Consciousness:  sedated, patient cooperative and responds to stimulation  Airway & Oxygen Therapy:Patient Spontanous Breathing and Patient connected to nasal oxgen  Post-op Assessment:  Report given to PACU RN and Post -op Vital signs reviewed and stable  Post vital signs:  Reviewed and stable  Last Vitals:  Filed Vitals:   11/28/15 0643 11/28/15 0746  BP: 198/75 166/71  Pulse: 80 72  Temp: 36.9 C 36.7 C  Resp: 18 12    Complications: No apparent anesthesia complications

## 2015-11-28 NOTE — Interval H&P Note (Signed)
History and Physical Interval Note:  11/28/2015 7:15 AM  [name]  has presented today for surgery, with the diagnosis of gerd  The various methods of treatment have been discussed with the patient and family. After consideration of risks, benefits and other options for treatment, the patient has consented to  Procedure(s): ESOPHAGOGASTRODUODENOSCOPY (EGD) WITH PROPOFOL (N/A) as a surgical intervention .  The patient's history has been reviewed, patient examined, no change in status, stable for surgery.  I have reviewed the patient's chart and labs.  Questions were answered to the patient's satisfaction.     Blake [name]

## 2015-11-28 NOTE — Discharge Instructions (Signed)

## 2015-11-28 NOTE — Anesthesia Preprocedure Evaluation (Addendum)
Anesthesia Evaluation  Patient identified by MRN, date of birth, ID band Patient awake    Reviewed: Allergy & Precautions, NPO status , Patient's Chart, lab work & pertinent test results  Airway Mallampati: II  TM Distance: >3 FB Neck ROM: Full    Dental no notable dental hx.    Pulmonary former smoker,    Pulmonary exam normal breath sounds clear to auscultation       Cardiovascular hypertension, Pt. on medications Normal cardiovascular exam Rhythm:Regular Rate:Normal     Neuro/Psych PSYCHIATRIC DISORDERS Depression negative neurological ROS     GI/Hepatic negative GI ROS, Neg liver ROS,   Endo/Other  diabetes, Type 2  Renal/GU CRFRenal disease  negative genitourinary   Musculoskeletal negative musculoskeletal ROS (+)   Abdominal   Peds negative pediatric ROS (+)  Hematology negative hematology ROS (+)   Anesthesia Other Findings   Reproductive/Obstetrics negative OB ROS                            Anesthesia Physical Anesthesia Plan  ASA: III  Anesthesia Plan: MAC   Post-op Pain Management:    Induction: Intravenous  Airway Management Planned: Natural Airway  Additional Equipment:   Intra-op Plan:   Post-operative Plan:   Informed Consent: I have reviewed the patients History and Physical, chart, labs and discussed the procedure including the risks, benefits and alternatives for the proposed anesthesia with the patient or authorized representative who has indicated his/her understanding and acceptance.   Dental advisory given  Plan Discussed with: CRNA  Anesthesia Plan Comments:         Anesthesia Quick Evaluation

## 2015-11-28 NOTE — Op Note (Signed)
[facility] Patient Name: Blake [name] Procedure Date: 11/28/2015 MRN: [id number] Attending MD: [name] , MD Date of Birth: 07/21/1946 CSN:  Age: 70 Admit Type: Outpatient Procedure:                Upper GI endoscopy Indications:              Nausea with vomiting Providers:                [name], MD, Cleda Daub, RN, Cherylynn Ridges, Technician Referring MD:              Medicines:                Monitored Anesthesia Care Complications:            No immediate complications. Estimated Blood Loss:     Estimated blood loss: none. Procedure:                Pre-Anesthesia Assessment:                           - Prior to the procedure, a History and Physical                            was performed, and patient medications and                            allergies were reviewed. The patient's tolerance of                            previous anesthesia was also reviewed. The risks                            and benefits of the procedure and the sedation                            options and risks were discussed with the patient.                            All questions were answered, and informed consent                            was obtained. Prior Anticoagulants: The patient has                            taken no previous anticoagulant or antiplatelet                            agents. ASA Grade Assessment: III - A patient with                            severe systemic disease. After reviewing the risks  and benefits, the patient was deemed in                            satisfactory condition to undergo the procedure.                           After obtaining informed consent, the endoscope was                            passed under direct vision. Throughout the                            procedure, the patient's blood pressure, pulse, and                            oxygen saturations were  monitored continuously. The                            [id number] scope was introduced through the                            mouth, and advanced to the second part of duodenum.                            The upper GI endoscopy was accomplished without                            difficulty. The patient tolerated the procedure                            well. Scope In: Scope Out: Findings:      The esophagus was normal.      The examined duodenum was normal.      A medium amount of food (residue) was found in the gastric body.      Scattered mild inflammation was found in the gastric antrum. Biopsies       were taken with a cold forceps for histology.      There was a small hiatal hernia.      The exam was otherwise without abnormality. Impression:               - Normal esophagus.                           - Normal examined duodenum.                           - A medium amount of food (residue) in the stomach.                            Small hiatal hernia.                           - Gastritis. Biopsied.                           -  The examination was otherwise normal. Moderate Sedation:      N/A- Per Anesthesia Care Recommendation:           - Patient has a contact number available for                            emergencies. The signs and symptoms of potential                            delayed complications were discussed with the                            patient. Return to normal activities tomorrow.                            Written discharge instructions were provided to the                            patient.                           - Resume previous diet.                           - Continue present medications.                           - Await pathology results.                           - No repeat upper endoscopy. Procedure Code(s):        --- Professional ---                           [id number], Esophagogastroduodenoscopy, flexible,                             transoral; with biopsy, single or multiple Diagnosis Code(s):        --- Professional ---                           K29.70, Gastritis, unspecified, without bleeding                           R11.2, Nausea with vomiting, unspecified CPT copyright 2016 American Medical Association. All rights reserved. The codes documented in this report are preliminary and upon coder review may  be revised to meet current compliance requirements. [name], MD [name], MD 11/28/2015 7:47:24 AM Number of Addenda: 0

## 2015-11-28 NOTE — H&P (View-Only) (Signed)
HPI: This is a very pleasant 70 year old man  who was referred to me by [name], MD  to evaluate  GERD  Chief complaint is intermittent vomiting  He has intermittent vomiting.  Will occur randomly, not even once per week.  Gets a pain in epigastrium, then nausea, then vomits.  Non bloody.  Usually when laying down. Has been a problem for about a year.  Takes 81mg  ASA daily.  He used to get pyrosis, especially when heavier.   Has lost 50 pounds in a years.  He started protonix after ER visit this past week or so.  Mild dysphagia to solids.    Review of systems: Pertinent positive and negative review of systems were noted in the above HPI section. Complete review of systems was performed and was otherwise normal.   Past Medical History  Diagnosis Date  . Diabetes mellitus without complication ([place])   . Hypertension   . Hyperlipidemia   . Renal disorder   . Acute osteomyelitis of toe of right foot ([place]) 08/02/2015    Past Surgical History  Procedure Laterality Date  . Back surgery    . Insertion of dialysis catheter Left 07/05/2014    Procedure: INSERTION OF DIALYSIS CATHETER-LEFT INTERNAL JUGULAR PLACEMENT;  Surgeon: Conrad [place]es [place], MD;  Location: [place];  Service: Vascular;  Laterality: Left;  . Av fistula placement Right 07/05/2014    Procedure: ARTERIOVENOUS BRACHIALCEPHALIC (AV) FISTULA CREATION;  Surgeon: Conrad Henderson, MD;  Location: [place];  Service: Vascular;  Laterality: Right;  . Removal of a dialysis catheter Right 07/05/2014    Procedure: REMOVAL OF A DIALYSIS CATHETER;  Surgeon: Conrad , MD;  Location: [place];  Service: Vascular;  Laterality: Right;  . Amputation toe Right 08/04/2015    Procedure: RIGHT GREAT TOE AMPUTATION;  Surgeon: [name], MD;  Location: [place];  Service: Orthopedics;  Laterality: Right;    Current Outpatient Prescriptions  Medication Sig Dispense Refill  . amLODipine (NORVASC) 10 MG tablet Take 10 mg by mouth daily.     Marland Kitchen  aspirin EC 81 MG tablet Take 81 mg by mouth daily.    Marland Kitchen atorvastatin (LIPITOR) 20 MG tablet Take 20 mg by mouth daily.    Marland Kitchen buPROPion (WELLBUTRIN XL) 150 MG 24 hr tablet Take 300 mg by mouth daily.    . DULoxetine (CYMBALTA) 20 MG capsule Take 1 capsule (20 mg total) by mouth daily.  3  . multivitamin (RENA-VIT) TABS tablet Take 1 tablet by mouth daily.    Marland Kitchen NOVOLIN R RELION 100 UNIT/ML injection Inject 3-13 Units into the skin 3 (three) times daily with meals as needed for high blood sugar.     . pantoprazole (PROTONIX) 20 MG tablet Take 1 tablet (20 mg total) by mouth daily. 60 tablet 0  . SENSIPAR 30 MG tablet Take 1 tablet by mouth daily.    . sevelamer carbonate (RENVELA) 800 MG tablet Take 1 tablet (800 mg total) by mouth 3 (three) times daily with meals. 90 tablet 11   No current facility-administered medications for this visit.    Allergies as of 11/12/2015  . (No Known Allergies)    Family History  Problem Relation Age of Onset  . Diabetes Father   . Heart attack Father     Social History   Social History  . Marital Status: Single    Spouse Name: N/A  . Number of Children: N/A  . Years of Education: N/A   Occupational History  .  Not on file.   Social History Main Topics  . Smoking status: Former Smoker -- 5 years    Types: Cigarettes    Quit date: 08/24/1979  . Smokeless tobacco: Never Used  . Alcohol Use: No  . Drug Use: No  . Sexual Activity: Not on file   Other Topics Concern  . Not on file   Social History Narrative     Physical Exam: BP 138/70 mmHg  Pulse 97  Ht 5\' 8"  (1.727 m)  Wt 195 lb (88.451 kg)  BMI 29.66 kg/m2 Constitutional: generally well-appearing Psychiatric: alert and oriented x3 Eyes: extraocular movements intact Mouth: oral pharynx moist, no lesions Neck: supple no lymphadenopathy Cardiovascular: heart regular rate and rhythm Lungs: clear to auscultation bilaterally Abdomen: soft, nontender, nondistended, no obvious ascites,  no peritoneal signs, normal bowel sounds Extremities: no lower extremity edema bilaterally Skin: no lesions on visible extremities   Assessment and plan: 70 y.o. male with  Intermittent vomiting, intermittent dysphasia, significant weight loss  He vomits occasionally, usually this is while laying down or just after getting up around dialysis time. It is not occurring very often. He does not vomit up any blood. He has no other significant GI symptoms but does have some abdominal discomfort just before the vomiting. He was started on proton pump inhibitor last week and I recommended he continue on that. We'll proceed with EGD at his soonest convenience for the intermittent vomiting. This is to look for peptic ulcer disease, H. Pylori, gastritis. He is also lost a lot of weight 50 pounds in the past year and does have some minor dysphagia symptoms as well.   Owens Loffler, MD [facility] 11/12/2015, 11:14 AM  Cc: [name], MD

## 2015-11-28 NOTE — Anesthesia Postprocedure Evaluation (Signed)
Anesthesia Post Note  Patient: Blake [name]  Procedure(s) Performed: Procedure(s) (LRB): ESOPHAGOGASTRODUODENOSCOPY (EGD) WITH PROPOFOL (N/A)  Patient location during evaluation: PACU Anesthesia Type: MAC Level of consciousness: awake and alert Pain management: pain level controlled Vital Signs Assessment: post-procedure vital signs reviewed and stable Respiratory status: spontaneous breathing, nonlabored ventilation, respiratory function stable and patient connected to nasal cannula oxygen Cardiovascular status: stable and blood pressure returned to baseline Anesthetic complications: no    Last Vitals:  Filed Vitals:   11/28/15 0800 11/28/15 0810  BP: 170/81 188/89  Pulse: 75 74  Temp:    Resp: 14 14    Last Pain: There were no vitals filed for this visit.               Melainie Krinsky J

## 2015-11-29 DIAGNOSIS — N186 End stage renal disease: Secondary | ICD-10-CM | POA: Diagnosis not present

## 2015-11-29 DIAGNOSIS — D509 Iron deficiency anemia, unspecified: Secondary | ICD-10-CM | POA: Diagnosis not present

## 2015-11-29 DIAGNOSIS — N2581 Secondary hyperparathyroidism of renal origin: Secondary | ICD-10-CM | POA: Diagnosis not present

## 2015-11-29 DIAGNOSIS — E1129 Type 2 diabetes mellitus with other diabetic kidney complication: Secondary | ICD-10-CM | POA: Diagnosis not present

## 2015-11-29 DIAGNOSIS — D689 Coagulation defect, unspecified: Secondary | ICD-10-CM | POA: Diagnosis not present

## 2015-12-02 DIAGNOSIS — N2581 Secondary hyperparathyroidism of renal origin: Secondary | ICD-10-CM | POA: Diagnosis not present

## 2015-12-02 DIAGNOSIS — D509 Iron deficiency anemia, unspecified: Secondary | ICD-10-CM | POA: Diagnosis not present

## 2015-12-02 DIAGNOSIS — E1129 Type 2 diabetes mellitus with other diabetic kidney complication: Secondary | ICD-10-CM | POA: Diagnosis not present

## 2015-12-02 DIAGNOSIS — D689 Coagulation defect, unspecified: Secondary | ICD-10-CM | POA: Diagnosis not present

## 2015-12-02 DIAGNOSIS — N186 End stage renal disease: Secondary | ICD-10-CM | POA: Diagnosis not present

## 2015-12-04 DIAGNOSIS — D689 Coagulation defect, unspecified: Secondary | ICD-10-CM | POA: Diagnosis not present

## 2015-12-04 DIAGNOSIS — D509 Iron deficiency anemia, unspecified: Secondary | ICD-10-CM | POA: Diagnosis not present

## 2015-12-04 DIAGNOSIS — N186 End stage renal disease: Secondary | ICD-10-CM | POA: Diagnosis not present

## 2015-12-04 DIAGNOSIS — N2581 Secondary hyperparathyroidism of renal origin: Secondary | ICD-10-CM | POA: Diagnosis not present

## 2015-12-04 DIAGNOSIS — E1129 Type 2 diabetes mellitus with other diabetic kidney complication: Secondary | ICD-10-CM | POA: Diagnosis not present

## 2015-12-06 DIAGNOSIS — N2581 Secondary hyperparathyroidism of renal origin: Secondary | ICD-10-CM | POA: Diagnosis not present

## 2015-12-06 DIAGNOSIS — N186 End stage renal disease: Secondary | ICD-10-CM | POA: Diagnosis not present

## 2015-12-06 DIAGNOSIS — D689 Coagulation defect, unspecified: Secondary | ICD-10-CM | POA: Diagnosis not present

## 2015-12-06 DIAGNOSIS — E1129 Type 2 diabetes mellitus with other diabetic kidney complication: Secondary | ICD-10-CM | POA: Diagnosis not present

## 2015-12-06 DIAGNOSIS — D509 Iron deficiency anemia, unspecified: Secondary | ICD-10-CM | POA: Diagnosis not present

## 2015-12-09 DIAGNOSIS — E162 Hypoglycemia, unspecified: Secondary | ICD-10-CM | POA: Diagnosis not present

## 2015-12-09 DIAGNOSIS — E161 Other hypoglycemia: Secondary | ICD-10-CM | POA: Diagnosis not present

## 2015-12-11 DIAGNOSIS — D509 Iron deficiency anemia, unspecified: Secondary | ICD-10-CM | POA: Diagnosis not present

## 2015-12-11 DIAGNOSIS — N2581 Secondary hyperparathyroidism of renal origin: Secondary | ICD-10-CM | POA: Diagnosis not present

## 2015-12-11 DIAGNOSIS — N186 End stage renal disease: Secondary | ICD-10-CM | POA: Diagnosis not present

## 2015-12-11 DIAGNOSIS — D689 Coagulation defect, unspecified: Secondary | ICD-10-CM | POA: Diagnosis not present

## 2015-12-11 DIAGNOSIS — E1129 Type 2 diabetes mellitus with other diabetic kidney complication: Secondary | ICD-10-CM | POA: Diagnosis not present

## 2015-12-12 DIAGNOSIS — Z992 Dependence on renal dialysis: Secondary | ICD-10-CM | POA: Diagnosis not present

## 2015-12-12 DIAGNOSIS — E1122 Type 2 diabetes mellitus with diabetic chronic kidney disease: Secondary | ICD-10-CM | POA: Diagnosis not present

## 2015-12-12 DIAGNOSIS — N186 End stage renal disease: Secondary | ICD-10-CM | POA: Diagnosis not present

## 2015-12-13 DIAGNOSIS — N186 End stage renal disease: Secondary | ICD-10-CM | POA: Diagnosis not present

## 2015-12-13 DIAGNOSIS — N2581 Secondary hyperparathyroidism of renal origin: Secondary | ICD-10-CM | POA: Diagnosis not present

## 2015-12-13 DIAGNOSIS — E1129 Type 2 diabetes mellitus with other diabetic kidney complication: Secondary | ICD-10-CM | POA: Diagnosis not present

## 2015-12-13 DIAGNOSIS — D689 Coagulation defect, unspecified: Secondary | ICD-10-CM | POA: Diagnosis not present

## 2015-12-13 DIAGNOSIS — D509 Iron deficiency anemia, unspecified: Secondary | ICD-10-CM | POA: Diagnosis not present

## 2015-12-13 DIAGNOSIS — E162 Hypoglycemia, unspecified: Secondary | ICD-10-CM | POA: Diagnosis not present

## 2015-12-16 DIAGNOSIS — N186 End stage renal disease: Secondary | ICD-10-CM | POA: Diagnosis not present

## 2015-12-16 DIAGNOSIS — D689 Coagulation defect, unspecified: Secondary | ICD-10-CM | POA: Diagnosis not present

## 2015-12-16 DIAGNOSIS — E1129 Type 2 diabetes mellitus with other diabetic kidney complication: Secondary | ICD-10-CM | POA: Diagnosis not present

## 2015-12-16 DIAGNOSIS — N2581 Secondary hyperparathyroidism of renal origin: Secondary | ICD-10-CM | POA: Diagnosis not present

## 2015-12-16 DIAGNOSIS — E162 Hypoglycemia, unspecified: Secondary | ICD-10-CM | POA: Diagnosis not present

## 2015-12-16 DIAGNOSIS — D509 Iron deficiency anemia, unspecified: Secondary | ICD-10-CM | POA: Diagnosis not present

## 2015-12-18 DIAGNOSIS — N186 End stage renal disease: Secondary | ICD-10-CM | POA: Diagnosis not present

## 2015-12-18 DIAGNOSIS — D689 Coagulation defect, unspecified: Secondary | ICD-10-CM | POA: Diagnosis not present

## 2015-12-18 DIAGNOSIS — E162 Hypoglycemia, unspecified: Secondary | ICD-10-CM | POA: Diagnosis not present

## 2015-12-18 DIAGNOSIS — N2581 Secondary hyperparathyroidism of renal origin: Secondary | ICD-10-CM | POA: Diagnosis not present

## 2015-12-18 DIAGNOSIS — D509 Iron deficiency anemia, unspecified: Secondary | ICD-10-CM | POA: Diagnosis not present

## 2015-12-18 DIAGNOSIS — E1129 Type 2 diabetes mellitus with other diabetic kidney complication: Secondary | ICD-10-CM | POA: Diagnosis not present

## 2015-12-20 DIAGNOSIS — N2581 Secondary hyperparathyroidism of renal origin: Secondary | ICD-10-CM | POA: Diagnosis not present

## 2015-12-20 DIAGNOSIS — D689 Coagulation defect, unspecified: Secondary | ICD-10-CM | POA: Diagnosis not present

## 2015-12-20 DIAGNOSIS — E1129 Type 2 diabetes mellitus with other diabetic kidney complication: Secondary | ICD-10-CM | POA: Diagnosis not present

## 2015-12-20 DIAGNOSIS — D509 Iron deficiency anemia, unspecified: Secondary | ICD-10-CM | POA: Diagnosis not present

## 2015-12-20 DIAGNOSIS — E162 Hypoglycemia, unspecified: Secondary | ICD-10-CM | POA: Diagnosis not present

## 2015-12-20 DIAGNOSIS — N186 End stage renal disease: Secondary | ICD-10-CM | POA: Diagnosis not present

## 2015-12-23 DIAGNOSIS — D509 Iron deficiency anemia, unspecified: Secondary | ICD-10-CM | POA: Diagnosis not present

## 2015-12-23 DIAGNOSIS — E162 Hypoglycemia, unspecified: Secondary | ICD-10-CM | POA: Diagnosis not present

## 2015-12-23 DIAGNOSIS — D689 Coagulation defect, unspecified: Secondary | ICD-10-CM | POA: Diagnosis not present

## 2015-12-23 DIAGNOSIS — N2581 Secondary hyperparathyroidism of renal origin: Secondary | ICD-10-CM | POA: Diagnosis not present

## 2015-12-23 DIAGNOSIS — E1129 Type 2 diabetes mellitus with other diabetic kidney complication: Secondary | ICD-10-CM | POA: Diagnosis not present

## 2015-12-23 DIAGNOSIS — N186 End stage renal disease: Secondary | ICD-10-CM | POA: Diagnosis not present

## 2015-12-27 DIAGNOSIS — D509 Iron deficiency anemia, unspecified: Secondary | ICD-10-CM | POA: Diagnosis not present

## 2015-12-27 DIAGNOSIS — E162 Hypoglycemia, unspecified: Secondary | ICD-10-CM | POA: Diagnosis not present

## 2015-12-27 DIAGNOSIS — N186 End stage renal disease: Secondary | ICD-10-CM | POA: Diagnosis not present

## 2015-12-27 DIAGNOSIS — E1129 Type 2 diabetes mellitus with other diabetic kidney complication: Secondary | ICD-10-CM | POA: Diagnosis not present

## 2015-12-27 DIAGNOSIS — D689 Coagulation defect, unspecified: Secondary | ICD-10-CM | POA: Diagnosis not present

## 2015-12-27 DIAGNOSIS — N2581 Secondary hyperparathyroidism of renal origin: Secondary | ICD-10-CM | POA: Diagnosis not present

## 2015-12-30 DIAGNOSIS — E1129 Type 2 diabetes mellitus with other diabetic kidney complication: Secondary | ICD-10-CM | POA: Diagnosis not present

## 2015-12-30 DIAGNOSIS — N186 End stage renal disease: Secondary | ICD-10-CM | POA: Diagnosis not present

## 2015-12-30 DIAGNOSIS — E162 Hypoglycemia, unspecified: Secondary | ICD-10-CM | POA: Diagnosis not present

## 2015-12-30 DIAGNOSIS — N2581 Secondary hyperparathyroidism of renal origin: Secondary | ICD-10-CM | POA: Diagnosis not present

## 2015-12-30 DIAGNOSIS — D689 Coagulation defect, unspecified: Secondary | ICD-10-CM | POA: Diagnosis not present

## 2015-12-30 DIAGNOSIS — D509 Iron deficiency anemia, unspecified: Secondary | ICD-10-CM | POA: Diagnosis not present

## 2016-01-01 DIAGNOSIS — E162 Hypoglycemia, unspecified: Secondary | ICD-10-CM | POA: Diagnosis not present

## 2016-01-01 DIAGNOSIS — D509 Iron deficiency anemia, unspecified: Secondary | ICD-10-CM | POA: Diagnosis not present

## 2016-01-01 DIAGNOSIS — N186 End stage renal disease: Secondary | ICD-10-CM | POA: Diagnosis not present

## 2016-01-01 DIAGNOSIS — N2581 Secondary hyperparathyroidism of renal origin: Secondary | ICD-10-CM | POA: Diagnosis not present

## 2016-01-01 DIAGNOSIS — E1129 Type 2 diabetes mellitus with other diabetic kidney complication: Secondary | ICD-10-CM | POA: Diagnosis not present

## 2016-01-01 DIAGNOSIS — D689 Coagulation defect, unspecified: Secondary | ICD-10-CM | POA: Diagnosis not present

## 2016-01-03 DIAGNOSIS — D509 Iron deficiency anemia, unspecified: Secondary | ICD-10-CM | POA: Diagnosis not present

## 2016-01-03 DIAGNOSIS — D689 Coagulation defect, unspecified: Secondary | ICD-10-CM | POA: Diagnosis not present

## 2016-01-03 DIAGNOSIS — N186 End stage renal disease: Secondary | ICD-10-CM | POA: Diagnosis not present

## 2016-01-03 DIAGNOSIS — E162 Hypoglycemia, unspecified: Secondary | ICD-10-CM | POA: Diagnosis not present

## 2016-01-03 DIAGNOSIS — E1129 Type 2 diabetes mellitus with other diabetic kidney complication: Secondary | ICD-10-CM | POA: Diagnosis not present

## 2016-01-03 DIAGNOSIS — N2581 Secondary hyperparathyroidism of renal origin: Secondary | ICD-10-CM | POA: Diagnosis not present

## 2016-01-06 DIAGNOSIS — E1129 Type 2 diabetes mellitus with other diabetic kidney complication: Secondary | ICD-10-CM | POA: Diagnosis not present

## 2016-01-06 DIAGNOSIS — N186 End stage renal disease: Secondary | ICD-10-CM | POA: Diagnosis not present

## 2016-01-06 DIAGNOSIS — N2581 Secondary hyperparathyroidism of renal origin: Secondary | ICD-10-CM | POA: Diagnosis not present

## 2016-01-06 DIAGNOSIS — D509 Iron deficiency anemia, unspecified: Secondary | ICD-10-CM | POA: Diagnosis not present

## 2016-01-06 DIAGNOSIS — E162 Hypoglycemia, unspecified: Secondary | ICD-10-CM | POA: Diagnosis not present

## 2016-01-06 DIAGNOSIS — D689 Coagulation defect, unspecified: Secondary | ICD-10-CM | POA: Diagnosis not present

## 2016-01-10 DIAGNOSIS — E1129 Type 2 diabetes mellitus with other diabetic kidney complication: Secondary | ICD-10-CM | POA: Diagnosis not present

## 2016-01-10 DIAGNOSIS — N2581 Secondary hyperparathyroidism of renal origin: Secondary | ICD-10-CM | POA: Diagnosis not present

## 2016-01-10 DIAGNOSIS — D509 Iron deficiency anemia, unspecified: Secondary | ICD-10-CM | POA: Diagnosis not present

## 2016-01-10 DIAGNOSIS — N186 End stage renal disease: Secondary | ICD-10-CM | POA: Diagnosis not present

## 2016-01-10 DIAGNOSIS — E162 Hypoglycemia, unspecified: Secondary | ICD-10-CM | POA: Diagnosis not present

## 2016-01-10 DIAGNOSIS — D689 Coagulation defect, unspecified: Secondary | ICD-10-CM | POA: Diagnosis not present

## 2016-01-11 DIAGNOSIS — E1122 Type 2 diabetes mellitus with diabetic chronic kidney disease: Secondary | ICD-10-CM | POA: Diagnosis not present

## 2016-01-11 DIAGNOSIS — Z992 Dependence on renal dialysis: Secondary | ICD-10-CM | POA: Diagnosis not present

## 2016-01-11 DIAGNOSIS — N186 End stage renal disease: Secondary | ICD-10-CM | POA: Diagnosis not present

## 2016-01-13 DIAGNOSIS — D689 Coagulation defect, unspecified: Secondary | ICD-10-CM | POA: Diagnosis not present

## 2016-01-13 DIAGNOSIS — E1129 Type 2 diabetes mellitus with other diabetic kidney complication: Secondary | ICD-10-CM | POA: Diagnosis not present

## 2016-01-13 DIAGNOSIS — N186 End stage renal disease: Secondary | ICD-10-CM | POA: Diagnosis not present

## 2016-01-13 DIAGNOSIS — N2581 Secondary hyperparathyroidism of renal origin: Secondary | ICD-10-CM | POA: Diagnosis not present

## 2016-01-15 DIAGNOSIS — N2581 Secondary hyperparathyroidism of renal origin: Secondary | ICD-10-CM | POA: Diagnosis not present

## 2016-01-15 DIAGNOSIS — D689 Coagulation defect, unspecified: Secondary | ICD-10-CM | POA: Diagnosis not present

## 2016-01-15 DIAGNOSIS — N186 End stage renal disease: Secondary | ICD-10-CM | POA: Diagnosis not present

## 2016-01-15 DIAGNOSIS — E1129 Type 2 diabetes mellitus with other diabetic kidney complication: Secondary | ICD-10-CM | POA: Diagnosis not present

## 2016-01-17 DIAGNOSIS — N186 End stage renal disease: Secondary | ICD-10-CM | POA: Diagnosis not present

## 2016-01-17 DIAGNOSIS — N2581 Secondary hyperparathyroidism of renal origin: Secondary | ICD-10-CM | POA: Diagnosis not present

## 2016-01-17 DIAGNOSIS — D689 Coagulation defect, unspecified: Secondary | ICD-10-CM | POA: Diagnosis not present

## 2016-01-17 DIAGNOSIS — E1129 Type 2 diabetes mellitus with other diabetic kidney complication: Secondary | ICD-10-CM | POA: Diagnosis not present

## 2016-01-22 DIAGNOSIS — D689 Coagulation defect, unspecified: Secondary | ICD-10-CM | POA: Diagnosis not present

## 2016-01-22 DIAGNOSIS — E1129 Type 2 diabetes mellitus with other diabetic kidney complication: Secondary | ICD-10-CM | POA: Diagnosis not present

## 2016-01-22 DIAGNOSIS — N186 End stage renal disease: Secondary | ICD-10-CM | POA: Diagnosis not present

## 2016-01-22 DIAGNOSIS — N2581 Secondary hyperparathyroidism of renal origin: Secondary | ICD-10-CM | POA: Diagnosis not present

## 2016-01-24 DIAGNOSIS — D689 Coagulation defect, unspecified: Secondary | ICD-10-CM | POA: Diagnosis not present

## 2016-01-24 DIAGNOSIS — N186 End stage renal disease: Secondary | ICD-10-CM | POA: Diagnosis not present

## 2016-01-24 DIAGNOSIS — E1129 Type 2 diabetes mellitus with other diabetic kidney complication: Secondary | ICD-10-CM | POA: Diagnosis not present

## 2016-01-24 DIAGNOSIS — N2581 Secondary hyperparathyroidism of renal origin: Secondary | ICD-10-CM | POA: Diagnosis not present

## 2016-01-27 DIAGNOSIS — N2581 Secondary hyperparathyroidism of renal origin: Secondary | ICD-10-CM | POA: Diagnosis not present

## 2016-01-27 DIAGNOSIS — E1129 Type 2 diabetes mellitus with other diabetic kidney complication: Secondary | ICD-10-CM | POA: Diagnosis not present

## 2016-01-27 DIAGNOSIS — D689 Coagulation defect, unspecified: Secondary | ICD-10-CM | POA: Diagnosis not present

## 2016-01-27 DIAGNOSIS — N186 End stage renal disease: Secondary | ICD-10-CM | POA: Diagnosis not present

## 2016-01-29 DIAGNOSIS — N186 End stage renal disease: Secondary | ICD-10-CM | POA: Diagnosis not present

## 2016-01-29 DIAGNOSIS — E1129 Type 2 diabetes mellitus with other diabetic kidney complication: Secondary | ICD-10-CM | POA: Diagnosis not present

## 2016-01-29 DIAGNOSIS — N2581 Secondary hyperparathyroidism of renal origin: Secondary | ICD-10-CM | POA: Diagnosis not present

## 2016-01-29 DIAGNOSIS — D689 Coagulation defect, unspecified: Secondary | ICD-10-CM | POA: Diagnosis not present

## 2016-01-31 DIAGNOSIS — D689 Coagulation defect, unspecified: Secondary | ICD-10-CM | POA: Diagnosis not present

## 2016-01-31 DIAGNOSIS — N2581 Secondary hyperparathyroidism of renal origin: Secondary | ICD-10-CM | POA: Diagnosis not present

## 2016-01-31 DIAGNOSIS — N186 End stage renal disease: Secondary | ICD-10-CM | POA: Diagnosis not present

## 2016-01-31 DIAGNOSIS — E1129 Type 2 diabetes mellitus with other diabetic kidney complication: Secondary | ICD-10-CM | POA: Diagnosis not present

## 2016-02-03 DIAGNOSIS — E1129 Type 2 diabetes mellitus with other diabetic kidney complication: Secondary | ICD-10-CM | POA: Diagnosis not present

## 2016-02-03 DIAGNOSIS — N2581 Secondary hyperparathyroidism of renal origin: Secondary | ICD-10-CM | POA: Diagnosis not present

## 2016-02-03 DIAGNOSIS — D689 Coagulation defect, unspecified: Secondary | ICD-10-CM | POA: Diagnosis not present

## 2016-02-03 DIAGNOSIS — N186 End stage renal disease: Secondary | ICD-10-CM | POA: Diagnosis not present

## 2016-02-05 DIAGNOSIS — E1129 Type 2 diabetes mellitus with other diabetic kidney complication: Secondary | ICD-10-CM | POA: Diagnosis not present

## 2016-02-05 DIAGNOSIS — N2581 Secondary hyperparathyroidism of renal origin: Secondary | ICD-10-CM | POA: Diagnosis not present

## 2016-02-05 DIAGNOSIS — N186 End stage renal disease: Secondary | ICD-10-CM | POA: Diagnosis not present

## 2016-02-05 DIAGNOSIS — D689 Coagulation defect, unspecified: Secondary | ICD-10-CM | POA: Diagnosis not present

## 2016-02-07 DIAGNOSIS — N186 End stage renal disease: Secondary | ICD-10-CM | POA: Diagnosis not present

## 2016-02-07 DIAGNOSIS — N2581 Secondary hyperparathyroidism of renal origin: Secondary | ICD-10-CM | POA: Diagnosis not present

## 2016-02-07 DIAGNOSIS — D689 Coagulation defect, unspecified: Secondary | ICD-10-CM | POA: Diagnosis not present

## 2016-02-07 DIAGNOSIS — E1129 Type 2 diabetes mellitus with other diabetic kidney complication: Secondary | ICD-10-CM | POA: Diagnosis not present

## 2016-02-11 DIAGNOSIS — E1122 Type 2 diabetes mellitus with diabetic chronic kidney disease: Secondary | ICD-10-CM | POA: Diagnosis not present

## 2016-02-11 DIAGNOSIS — Z992 Dependence on renal dialysis: Secondary | ICD-10-CM | POA: Diagnosis not present

## 2016-02-11 DIAGNOSIS — N186 End stage renal disease: Secondary | ICD-10-CM | POA: Diagnosis not present

## 2016-02-12 DIAGNOSIS — E8770 Fluid overload, unspecified: Secondary | ICD-10-CM | POA: Diagnosis not present

## 2016-02-12 DIAGNOSIS — N2581 Secondary hyperparathyroidism of renal origin: Secondary | ICD-10-CM | POA: Diagnosis not present

## 2016-02-12 DIAGNOSIS — E1129 Type 2 diabetes mellitus with other diabetic kidney complication: Secondary | ICD-10-CM | POA: Diagnosis not present

## 2016-02-12 DIAGNOSIS — N186 End stage renal disease: Secondary | ICD-10-CM | POA: Diagnosis not present

## 2016-02-12 DIAGNOSIS — D689 Coagulation defect, unspecified: Secondary | ICD-10-CM | POA: Diagnosis not present

## 2016-02-14 DIAGNOSIS — N2581 Secondary hyperparathyroidism of renal origin: Secondary | ICD-10-CM | POA: Diagnosis not present

## 2016-02-14 DIAGNOSIS — D689 Coagulation defect, unspecified: Secondary | ICD-10-CM | POA: Diagnosis not present

## 2016-02-14 DIAGNOSIS — N186 End stage renal disease: Secondary | ICD-10-CM | POA: Diagnosis not present

## 2016-02-14 DIAGNOSIS — E8770 Fluid overload, unspecified: Secondary | ICD-10-CM | POA: Diagnosis not present

## 2016-02-14 DIAGNOSIS — E1129 Type 2 diabetes mellitus with other diabetic kidney complication: Secondary | ICD-10-CM | POA: Diagnosis not present

## 2016-02-17 DIAGNOSIS — E8770 Fluid overload, unspecified: Secondary | ICD-10-CM | POA: Diagnosis not present

## 2016-02-17 DIAGNOSIS — N2581 Secondary hyperparathyroidism of renal origin: Secondary | ICD-10-CM | POA: Diagnosis not present

## 2016-02-17 DIAGNOSIS — D689 Coagulation defect, unspecified: Secondary | ICD-10-CM | POA: Diagnosis not present

## 2016-02-17 DIAGNOSIS — N186 End stage renal disease: Secondary | ICD-10-CM | POA: Diagnosis not present

## 2016-02-17 DIAGNOSIS — E1129 Type 2 diabetes mellitus with other diabetic kidney complication: Secondary | ICD-10-CM | POA: Diagnosis not present

## 2016-02-19 DIAGNOSIS — N2581 Secondary hyperparathyroidism of renal origin: Secondary | ICD-10-CM | POA: Diagnosis not present

## 2016-02-19 DIAGNOSIS — E1129 Type 2 diabetes mellitus with other diabetic kidney complication: Secondary | ICD-10-CM | POA: Diagnosis not present

## 2016-02-19 DIAGNOSIS — E8770 Fluid overload, unspecified: Secondary | ICD-10-CM | POA: Diagnosis not present

## 2016-02-19 DIAGNOSIS — N186 End stage renal disease: Secondary | ICD-10-CM | POA: Diagnosis not present

## 2016-02-19 DIAGNOSIS — D689 Coagulation defect, unspecified: Secondary | ICD-10-CM | POA: Diagnosis not present

## 2016-02-21 DIAGNOSIS — D689 Coagulation defect, unspecified: Secondary | ICD-10-CM | POA: Diagnosis not present

## 2016-02-21 DIAGNOSIS — E1129 Type 2 diabetes mellitus with other diabetic kidney complication: Secondary | ICD-10-CM | POA: Diagnosis not present

## 2016-02-21 DIAGNOSIS — N186 End stage renal disease: Secondary | ICD-10-CM | POA: Diagnosis not present

## 2016-02-21 DIAGNOSIS — E8770 Fluid overload, unspecified: Secondary | ICD-10-CM | POA: Diagnosis not present

## 2016-02-21 DIAGNOSIS — N2581 Secondary hyperparathyroidism of renal origin: Secondary | ICD-10-CM | POA: Diagnosis not present

## 2016-02-24 DIAGNOSIS — E8770 Fluid overload, unspecified: Secondary | ICD-10-CM | POA: Diagnosis not present

## 2016-02-24 DIAGNOSIS — D689 Coagulation defect, unspecified: Secondary | ICD-10-CM | POA: Diagnosis not present

## 2016-02-24 DIAGNOSIS — N186 End stage renal disease: Secondary | ICD-10-CM | POA: Diagnosis not present

## 2016-02-24 DIAGNOSIS — E1129 Type 2 diabetes mellitus with other diabetic kidney complication: Secondary | ICD-10-CM | POA: Diagnosis not present

## 2016-02-24 DIAGNOSIS — N2581 Secondary hyperparathyroidism of renal origin: Secondary | ICD-10-CM | POA: Diagnosis not present

## 2016-02-26 DIAGNOSIS — E1129 Type 2 diabetes mellitus with other diabetic kidney complication: Secondary | ICD-10-CM | POA: Diagnosis not present

## 2016-02-26 DIAGNOSIS — N2581 Secondary hyperparathyroidism of renal origin: Secondary | ICD-10-CM | POA: Diagnosis not present

## 2016-02-26 DIAGNOSIS — E8770 Fluid overload, unspecified: Secondary | ICD-10-CM | POA: Diagnosis not present

## 2016-02-26 DIAGNOSIS — N186 End stage renal disease: Secondary | ICD-10-CM | POA: Diagnosis not present

## 2016-02-26 DIAGNOSIS — D689 Coagulation defect, unspecified: Secondary | ICD-10-CM | POA: Diagnosis not present

## 2016-03-02 ENCOUNTER — Emergency Department (HOSPITAL_COMMUNITY): Payer: Medicare Other

## 2016-03-02 ENCOUNTER — Encounter (HOSPITAL_COMMUNITY): Payer: Self-pay | Admitting: Emergency Medicine

## 2016-03-02 ENCOUNTER — Emergency Department (HOSPITAL_COMMUNITY)
Admission: EM | Admit: 2016-03-02 | Discharge: 2016-03-02 | Disposition: A | Discharge status: Home / Self Care | Payer: Medicare Other | Attending: Emergency Medicine | Admitting: Emergency Medicine

## 2016-03-02 ENCOUNTER — Other Ambulatory Visit: Payer: Self-pay

## 2016-03-02 DIAGNOSIS — N186 End stage renal disease: Secondary | ICD-10-CM | POA: Diagnosis not present

## 2016-03-02 DIAGNOSIS — Z992 Dependence on renal dialysis: Secondary | ICD-10-CM | POA: Insufficient documentation

## 2016-03-02 DIAGNOSIS — Z79899 Other long term (current) drug therapy: Secondary | ICD-10-CM | POA: Insufficient documentation

## 2016-03-02 DIAGNOSIS — R531 Weakness: Secondary | ICD-10-CM | POA: Diagnosis present

## 2016-03-02 DIAGNOSIS — E8770 Fluid overload, unspecified: Secondary | ICD-10-CM | POA: Diagnosis not present

## 2016-03-02 DIAGNOSIS — N2581 Secondary hyperparathyroidism of renal origin: Secondary | ICD-10-CM | POA: Diagnosis not present

## 2016-03-02 DIAGNOSIS — E1122 Type 2 diabetes mellitus with diabetic chronic kidney disease: Secondary | ICD-10-CM | POA: Insufficient documentation

## 2016-03-02 DIAGNOSIS — I120 Hypertensive chronic kidney disease with stage 5 chronic kidney disease or end stage renal disease: Secondary | ICD-10-CM | POA: Diagnosis not present

## 2016-03-02 DIAGNOSIS — Z7982 Long term (current) use of aspirin: Secondary | ICD-10-CM | POA: Diagnosis not present

## 2016-03-02 DIAGNOSIS — Z9115 Patient's noncompliance with renal dialysis: Secondary | ICD-10-CM

## 2016-03-02 DIAGNOSIS — E1129 Type 2 diabetes mellitus with other diabetic kidney complication: Secondary | ICD-10-CM | POA: Diagnosis not present

## 2016-03-02 DIAGNOSIS — E875 Hyperkalemia: Secondary | ICD-10-CM | POA: Diagnosis not present

## 2016-03-02 DIAGNOSIS — Z87891 Personal history of nicotine dependence: Secondary | ICD-10-CM | POA: Diagnosis not present

## 2016-03-02 DIAGNOSIS — D689 Coagulation defect, unspecified: Secondary | ICD-10-CM | POA: Diagnosis not present

## 2016-03-02 DIAGNOSIS — I6789 Other cerebrovascular disease: Secondary | ICD-10-CM | POA: Diagnosis not present

## 2016-03-02 DIAGNOSIS — R202 Paresthesia of skin: Secondary | ICD-10-CM | POA: Diagnosis not present

## 2016-03-02 LAB — CBC WITH DIFFERENTIAL/PLATELET
BASOS PCT: 0 %
Basophils Absolute: 0 10*3/uL (ref 0.0–0.1)
EOS PCT: 1 %
Eosinophils Absolute: 0.1 10*3/uL (ref 0.0–0.7)
HCT: 33.5 % — ABNORMAL LOW (ref 39.0–52.0)
HEMOGLOBIN: 10.4 g/dL — AB (ref 13.0–17.0)
Lymphocytes Relative: 12 %
Lymphs Abs: 1 10*3/uL (ref 0.7–4.0)
MCH: 28.4 pg (ref 26.0–34.0)
MCHC: 31 g/dL (ref 30.0–36.0)
MCV: 91.5 fL (ref 78.0–100.0)
Monocytes Absolute: 0.6 10*3/uL (ref 0.1–1.0)
Monocytes Relative: 7 %
NEUTROS PCT: 80 %
Neutro Abs: 7.1 10*3/uL (ref 1.7–7.7)
PLATELETS: 182 10*3/uL (ref 150–400)
RBC: 3.66 MIL/uL — AB (ref 4.22–5.81)
RDW: 13.2 % (ref 11.5–15.5)
WBC: 8.8 10*3/uL (ref 4.0–10.5)

## 2016-03-02 LAB — I-STAT TROPONIN, ED: TROPONIN I, POC: 0.08 ng/mL (ref 0.00–0.08)

## 2016-03-02 LAB — COMPREHENSIVE METABOLIC PANEL
ALK PHOS: 67 U/L (ref 38–126)
ALT: 23 U/L (ref 17–63)
AST: 26 U/L (ref 15–41)
Albumin: 3.3 g/dL — ABNORMAL LOW (ref 3.5–5.0)
Anion gap: 10 (ref 5–15)
BUN: 104 mg/dL — ABNORMAL HIGH (ref 6–20)
CALCIUM: 7.6 mg/dL — AB (ref 8.9–10.3)
CO2: 20 mmol/L — AB (ref 22–32)
CREATININE: 11.74 mg/dL — AB (ref 0.61–1.24)
Chloride: 111 mmol/L (ref 101–111)
GFR calc non Af Amer: 4 mL/min — ABNORMAL LOW (ref >60)
GFR, EST AFRICAN AMERICAN: 4 mL/min — AB (ref >60)
Glucose, Bld: 129 mg/dL — ABNORMAL HIGH (ref 65–99)
Potassium: 7.3 mmol/L (ref 3.5–5.1)
SODIUM: 141 mmol/L (ref 135–145)
Total Bilirubin: 0.5 mg/dL (ref 0.3–1.2)
Total Protein: 6.8 g/dL (ref 6.5–8.1)

## 2016-03-02 LAB — TROPONIN I: TROPONIN I: 0.09 ng/mL — AB (ref <0.031)

## 2016-03-02 LAB — LIPASE, BLOOD: LIPASE: 29 U/L (ref 11–51)

## 2016-03-02 MED ORDER — SODIUM BICARBONATE 8.4 % IV SOLN
50.0000 meq | Freq: Once | INTRAVENOUS | Status: AC
  Administered 2016-03-02: 50 meq via INTRAVENOUS
  Filled 2016-03-02: qty 50

## 2016-03-02 MED ORDER — INSULIN ASPART 100 UNIT/ML IV SOLN
10.0000 [IU] | Freq: Once | INTRAVENOUS | Status: AC
  Administered 2016-03-02: 10 [IU] via INTRAVENOUS
  Filled 2016-03-02: qty 1

## 2016-03-02 MED ORDER — DEXTROSE 50 % IV SOLN
1.0000 | Freq: Once | INTRAVENOUS | Status: AC
  Administered 2016-03-02: 50 mL via INTRAVENOUS
  Filled 2016-03-02: qty 50

## 2016-03-02 NOTE — Progress Notes (Signed)
Upon arrival to Patient room, Patient informed CSW that he had just gotten off of the phone with someone who was going to pick him up. This was confirmed by RN. Patient no longer in need of a taxi voucher to his dialysis appt. CSW signing off. Please contact if new need(s) arise.          Emiliano Dyer, LCSW [facility] ED/38M Clinical Social Worker [phone]

## 2016-03-02 NOTE — ED Notes (Signed)
Niece wii be taking patient to dialysis , placed patient in nieces car

## 2016-03-02 NOTE — Discharge Instructions (Signed)
Chronic Kidney Disease Chronic kidney disease happens when the kidneys are damaged over a long period. The kidneys are two organs that do many important jobs in the body. These jobs include:  Removing wastes and extra fluids from the blood.  Making hormones that help to keep the body healthy.  Making sure that the body has the right amount of fluids and chemicals. Chronic kidney disease may be caused by many things. The kidney damage occurs slowly. If too much damage occurs, the kidneys may stop working the way that they should. This is dangerous. Treatment can help to slow down the damage and keep it from getting worse. HOME CARE  Follow your diet as told by your doctor. You may need to limit the amount of salt (sodium) and protein that you eat each day.  Take medicines only as told by your doctor. Do not take any new medicines unless your doctor approves it.  Quit smoking if you smoke. Talk to your doctor about programs that may help you quit smoking.  Have your blood pressure checked regularly and keep track of the results.  Start or keep doing an exercise plan.  Get shots (immunizations) as told by your doctor.  Take vitamins and minerals as told by your doctor.  Keep all follow-up visits as told by your doctor. This is important. GET HELP RIGHT AWAY IF:   Your symptoms get worse.  You have new symptoms.  You have symptoms of end-stage kidney disease. These include:  Headaches.  Skin that is darker or lighter than normal.  Numbness in the hands or feet.  Easy bruising.  Frequent hiccups.  Stopping of menstrual periods in women.  You have a fever.  You are making very little pee (urine).  You have pain or bleeding when you pee.   This information is not intended to replace advice given to you by your health care provider. Make sure you discuss any questions you have with your health care provider.   Document Released: 11/24/2009 Document Revised: 05/21/2015  Document Reviewed: 04/28/2012 Elsevier Interactive Patient Education 2016 Elsevier Inc.  

## 2016-03-02 NOTE — ED Notes (Signed)
Patient was taken to xray however he refused

## 2016-03-02 NOTE — ED Notes (Signed)
Pt is pale and looks unwell; woke up around 5am with tingling in hands and feet; sugar was 40. Ate some food and went back to sleep. EMS called by dialysis center when he missed appt this morning. He has missed 2 sessions due to vomiting. Initial sugar 140 per fire; 151 per EMS 10 mins ago. Dizzy upon standing; negative orthostatics.

## 2016-03-02 NOTE — ED Notes (Signed)
Social worker working on getting pt a taxi Pension scheme manager to go to [facility] for dialysis.

## 2016-03-02 NOTE — ED Provider Notes (Signed)
CSN: [id number]     Arrival date & time 03/02/16  1035 History   First MD Initiated Contact with Patient 03/02/16 1117     Chief Complaint  Patient presents with  . Weakness  PT IS A 70 YO BM WHO IS A DIALYSIS PATIENT.  HE SAID THAT HE WOKE UP AROUND 0500 WITH TINGLING IN HIS HANDS AND FEET.  HIS BS WAS 40.  HE ATE 2 EGGS AND WENT BACK TO SLEEP.  THE PT SAID HE MISSED DIALYSIS ON Saturday THE 17TH AND TODAY.  PT SAID HE HAD SOME VOMITING ON Saturday WHICH IS WHY HE MISSED THEN.  THE DIALYSIS CENTER CALLED EMS TO CHECK ON PT B/C HE MISSED HIS APPOINTMENT THIS AM.  PT IS NOT HAPPY TO BE HERE.  HE WANTS TO GO HOME.  THE PT SAID THAT HE FEELS OK NOW.   (Consider location/radiation/quality/duration/timing/severity/associated sxs/prior Treatment) Patient is a 70 y.o. male presenting with weakness. The history is provided by the patient and the EMS personnel.  Weakness This is a recurrent problem. The current episode started more than 2 days ago. The problem has been resolved.    Past Medical History  Diagnosis Date  . Diabetes mellitus without complication ([place])   . Hypertension   . Hyperlipidemia   . Renal disorder   . Acute osteomyelitis of toe of right foot ([place]) 08/02/2015   Past Surgical History  Procedure Laterality Date  . Back surgery    . Insertion of dialysis catheter Left 07/05/2014    Procedure: INSERTION OF DIALYSIS CATHETER-LEFT INTERNAL JUGULAR PLACEMENT;  Surgeon: Conrad Eureka, MD;  Location: [place];  Service: Vascular;  Laterality: Left;  . Av fistula placement Right 07/05/2014    Procedure: ARTERIOVENOUS BRACHIALCEPHALIC (AV) FISTULA CREATION;  Surgeon: Conrad Lithonia, MD;  Location: [place];  Service: Vascular;  Laterality: Right;  . Removal of a dialysis catheter Right 07/05/2014    Procedure: REMOVAL OF A DIALYSIS CATHETER;  Surgeon: Conrad , MD;  Location: [place];  Service: Vascular;  Laterality: Right;  . Amputation toe Right 08/04/2015    Procedure: RIGHT GREAT TOE  AMPUTATION;  Surgeon: [name], MD;  Location: [place];  Service: Orthopedics;  Laterality: Right;  . Esophagogastroduodenoscopy (egd) with propofol N/A 11/28/2015    Procedure: ESOPHAGOGASTRODUODENOSCOPY (EGD) WITH PROPOFOL;  Surgeon: [name], MD;  [facility];  Service: Endoscopy;  Laterality: N/A;   Family History  Problem Relation Age of Onset  . Diabetes Father   . Heart attack Father    Social History  Substance Use Topics  . Smoking status: Former Smoker -- 5 years    Types: Cigarettes    Quit date: 08/24/1979  . Smokeless tobacco: Never Used  . Alcohol Use: No    Review of Systems  Gastrointestinal: Positive for vomiting.  Neurological: Positive for weakness.  All other systems reviewed and are negative.     Allergies  Review of patient's allergies indicates no known allergies.  Home Medications   Prior to Admission medications   Medication Sig Start Date End Date Taking? Authorizing Provider  amLODipine (NORVASC) 10 MG tablet Take 10 mg by mouth daily.  06/18/14  Yes Historical Provider, MD  aspirin EC 81 MG tablet Take 81 mg by mouth daily.   Yes Historical Provider, MD  atorvastatin (LIPITOR) 20 MG tablet Take 20 mg by mouth daily.   Yes Historical Provider, MD  buPROPion (WELLBUTRIN XL) 150 MG 24 hr tablet Take 300 mg  by mouth daily.   Yes Historical Provider, MD  DULoxetine (CYMBALTA) 20 MG capsule Take 1 capsule (20 mg total) by mouth daily. Patient taking differently: Take 20 mg by mouth daily after supper.  07/09/14  Yes Albertine Patricia, MD  multivitamin (RENA-VIT) TABS tablet Take 1 tablet by mouth daily.   Yes Historical Provider, MD  NOVOLIN R RELION 100 UNIT/ML injection Inject 3-13 Units into the skin 3 (three) times daily with meals as needed for high blood sugar.  06/13/14  Yes Historical Provider, MD  pantoprazole (PROTONIX) 20 MG tablet Take 1 tablet (20 mg total) by mouth daily. 10/25/15  Yes Hanna Patel-Mills, PA-C  SENSIPAR 30  MG tablet Take 1 tablet by mouth daily. 07/23/15  Yes Historical Provider, MD  sevelamer carbonate (RENVELA) 800 MG tablet Take 1 tablet (800 mg total) by mouth 3 (three) times daily with meals. 08/23/14  Yes Conrad Hartington, MD   BP 162/71 mmHg  Pulse 76  Temp(Src) 98.1 F (36.7 C) (Oral)  Resp 16  SpO2 100% Physical Exam  Constitutional: He is oriented to person, place, and time. He appears well-developed and well-nourished.  HENT:  Head: Normocephalic and atraumatic.  Right Ear: External ear normal.  Left Ear: External ear normal.  Nose: Nose normal.  Mouth/Throat: Oropharynx is clear and moist.  Eyes: Conjunctivae are normal. Pupils are equal, round, and reactive to light.  Neck: Normal range of motion. Neck supple.  Cardiovascular: Normal rate, regular rhythm, normal heart sounds and intact distal pulses.   Pulmonary/Chest: Effort normal and breath sounds normal.  Abdominal: Soft. Bowel sounds are normal.  Musculoskeletal: Normal range of motion.  Neurological: He is alert and oriented to person, place, and time.  Skin: Skin is warm and dry.  Psychiatric: He has a normal mood and affect. His behavior is normal. Judgment and thought content normal.  Nursing note and vitals reviewed.   ED Course  Procedures (including critical care time) Labs Review Labs Reviewed  COMPREHENSIVE METABOLIC PANEL - Abnormal; Notable for the following:    Potassium 7.3 (*)    CO2 20 (*)    Glucose, Bld 129 (*)    BUN 104 (*)    Creatinine, Ser 11.74 (*)    Calcium 7.6 (*)    Albumin 3.3 (*)    GFR calc non Af Amer 4 (*)    GFR calc Af Amer 4 (*)    All other components within normal limits  CBC WITH DIFFERENTIAL/PLATELET - Abnormal; Notable for the following:    RBC 3.66 (*)    Hemoglobin 10.4 (*)    HCT 33.5 (*)    All other components within normal limits  TROPONIN I - Abnormal; Notable for the following:    Troponin I 0.09 (*)    All other components within normal limits  LIPASE,  BLOOD  I-STAT TROPOININ, ED    Imaging Review No results found. I have personally reviewed and evaluated these images and lab results as part of my medical decision-making.   EKG Interpretation   Date/Time:  Tuesday March 02 2016 10:52:43 EDT Ventricular Rate:  79 PR Interval:    QRS Duration: 107 QT Interval:  412 QTC Calculation: 473 R Axis:   55 Text Interpretation:  Sinus rhythm Confirmed by Eboni Coval MD, Roi Jafari ([id number])  on 03/02/2016 11:13:24 AM     PT REFUSED CXR. MDM  PT'S HYPERKALEMIA TREATED HERE.  I SPOKE WITH PT'S DIALYSIS CENTER ON [facility] [place] AND THEY ARE ABLE TO  TAKE HIM.  WE WILL GIVE PT A TAXI VOUCHER TO TAKE HIM STRAIGHT THERE.  Final diagnoses:  Hyperkalemia  ESRD on hemodialysis [facility])  Noncompliance of patient with renal dialysis [facility])      Isla Pence, MD 03/02/16 1315

## 2016-03-03 DIAGNOSIS — E1129 Type 2 diabetes mellitus with other diabetic kidney complication: Secondary | ICD-10-CM | POA: Diagnosis not present

## 2016-03-03 DIAGNOSIS — E8770 Fluid overload, unspecified: Secondary | ICD-10-CM | POA: Diagnosis not present

## 2016-03-03 DIAGNOSIS — N2581 Secondary hyperparathyroidism of renal origin: Secondary | ICD-10-CM | POA: Diagnosis not present

## 2016-03-03 DIAGNOSIS — D689 Coagulation defect, unspecified: Secondary | ICD-10-CM | POA: Diagnosis not present

## 2016-03-03 DIAGNOSIS — N186 End stage renal disease: Secondary | ICD-10-CM | POA: Diagnosis not present

## 2016-03-06 DIAGNOSIS — E1129 Type 2 diabetes mellitus with other diabetic kidney complication: Secondary | ICD-10-CM | POA: Diagnosis not present

## 2016-03-06 DIAGNOSIS — N186 End stage renal disease: Secondary | ICD-10-CM | POA: Diagnosis not present

## 2016-03-06 DIAGNOSIS — E8770 Fluid overload, unspecified: Secondary | ICD-10-CM | POA: Diagnosis not present

## 2016-03-06 DIAGNOSIS — D689 Coagulation defect, unspecified: Secondary | ICD-10-CM | POA: Diagnosis not present

## 2016-03-06 DIAGNOSIS — N2581 Secondary hyperparathyroidism of renal origin: Secondary | ICD-10-CM | POA: Diagnosis not present

## 2016-03-09 DIAGNOSIS — E8770 Fluid overload, unspecified: Secondary | ICD-10-CM | POA: Diagnosis not present

## 2016-03-09 DIAGNOSIS — D689 Coagulation defect, unspecified: Secondary | ICD-10-CM | POA: Diagnosis not present

## 2016-03-09 DIAGNOSIS — E1129 Type 2 diabetes mellitus with other diabetic kidney complication: Secondary | ICD-10-CM | POA: Diagnosis not present

## 2016-03-09 DIAGNOSIS — N2581 Secondary hyperparathyroidism of renal origin: Secondary | ICD-10-CM | POA: Diagnosis not present

## 2016-03-09 DIAGNOSIS — N186 End stage renal disease: Secondary | ICD-10-CM | POA: Diagnosis not present

## 2016-03-11 DIAGNOSIS — N2581 Secondary hyperparathyroidism of renal origin: Secondary | ICD-10-CM | POA: Diagnosis not present

## 2016-03-11 DIAGNOSIS — E1129 Type 2 diabetes mellitus with other diabetic kidney complication: Secondary | ICD-10-CM | POA: Diagnosis not present

## 2016-03-11 DIAGNOSIS — D689 Coagulation defect, unspecified: Secondary | ICD-10-CM | POA: Diagnosis not present

## 2016-03-11 DIAGNOSIS — E8770 Fluid overload, unspecified: Secondary | ICD-10-CM | POA: Diagnosis not present

## 2016-03-11 DIAGNOSIS — N186 End stage renal disease: Secondary | ICD-10-CM | POA: Diagnosis not present

## 2016-03-12 DIAGNOSIS — E1122 Type 2 diabetes mellitus with diabetic chronic kidney disease: Secondary | ICD-10-CM | POA: Diagnosis not present

## 2016-03-12 DIAGNOSIS — N186 End stage renal disease: Secondary | ICD-10-CM | POA: Diagnosis not present

## 2016-03-12 DIAGNOSIS — Z992 Dependence on renal dialysis: Secondary | ICD-10-CM | POA: Diagnosis not present

## 2016-03-13 DIAGNOSIS — E1129 Type 2 diabetes mellitus with other diabetic kidney complication: Secondary | ICD-10-CM | POA: Diagnosis not present

## 2016-03-13 DIAGNOSIS — N2581 Secondary hyperparathyroidism of renal origin: Secondary | ICD-10-CM | POA: Diagnosis not present

## 2016-03-13 DIAGNOSIS — D689 Coagulation defect, unspecified: Secondary | ICD-10-CM | POA: Diagnosis not present

## 2016-03-13 DIAGNOSIS — N186 End stage renal disease: Secondary | ICD-10-CM | POA: Diagnosis not present

## 2016-03-16 DIAGNOSIS — N2581 Secondary hyperparathyroidism of renal origin: Secondary | ICD-10-CM | POA: Diagnosis not present

## 2016-03-16 DIAGNOSIS — D689 Coagulation defect, unspecified: Secondary | ICD-10-CM | POA: Diagnosis not present

## 2016-03-16 DIAGNOSIS — E1129 Type 2 diabetes mellitus with other diabetic kidney complication: Secondary | ICD-10-CM | POA: Diagnosis not present

## 2016-03-16 DIAGNOSIS — N186 End stage renal disease: Secondary | ICD-10-CM | POA: Diagnosis not present

## 2016-03-18 DIAGNOSIS — N2581 Secondary hyperparathyroidism of renal origin: Secondary | ICD-10-CM | POA: Diagnosis not present

## 2016-03-18 DIAGNOSIS — N186 End stage renal disease: Secondary | ICD-10-CM | POA: Diagnosis not present

## 2016-03-18 DIAGNOSIS — E1129 Type 2 diabetes mellitus with other diabetic kidney complication: Secondary | ICD-10-CM | POA: Diagnosis not present

## 2016-03-18 DIAGNOSIS — D689 Coagulation defect, unspecified: Secondary | ICD-10-CM | POA: Diagnosis not present

## 2016-03-20 DIAGNOSIS — N2581 Secondary hyperparathyroidism of renal origin: Secondary | ICD-10-CM | POA: Diagnosis not present

## 2016-03-20 DIAGNOSIS — E1129 Type 2 diabetes mellitus with other diabetic kidney complication: Secondary | ICD-10-CM | POA: Diagnosis not present

## 2016-03-20 DIAGNOSIS — D689 Coagulation defect, unspecified: Secondary | ICD-10-CM | POA: Diagnosis not present

## 2016-03-20 DIAGNOSIS — N186 End stage renal disease: Secondary | ICD-10-CM | POA: Diagnosis not present

## 2016-03-23 DIAGNOSIS — D689 Coagulation defect, unspecified: Secondary | ICD-10-CM | POA: Diagnosis not present

## 2016-03-23 DIAGNOSIS — N2581 Secondary hyperparathyroidism of renal origin: Secondary | ICD-10-CM | POA: Diagnosis not present

## 2016-03-23 DIAGNOSIS — E1129 Type 2 diabetes mellitus with other diabetic kidney complication: Secondary | ICD-10-CM | POA: Diagnosis not present

## 2016-03-23 DIAGNOSIS — N186 End stage renal disease: Secondary | ICD-10-CM | POA: Diagnosis not present

## 2016-03-25 DIAGNOSIS — N2581 Secondary hyperparathyroidism of renal origin: Secondary | ICD-10-CM | POA: Diagnosis not present

## 2016-03-25 DIAGNOSIS — N186 End stage renal disease: Secondary | ICD-10-CM | POA: Diagnosis not present

## 2016-03-25 DIAGNOSIS — E1129 Type 2 diabetes mellitus with other diabetic kidney complication: Secondary | ICD-10-CM | POA: Diagnosis not present

## 2016-03-25 DIAGNOSIS — D689 Coagulation defect, unspecified: Secondary | ICD-10-CM | POA: Diagnosis not present

## 2016-03-27 DIAGNOSIS — E1129 Type 2 diabetes mellitus with other diabetic kidney complication: Secondary | ICD-10-CM | POA: Diagnosis not present

## 2016-03-27 DIAGNOSIS — N2581 Secondary hyperparathyroidism of renal origin: Secondary | ICD-10-CM | POA: Diagnosis not present

## 2016-03-27 DIAGNOSIS — N186 End stage renal disease: Secondary | ICD-10-CM | POA: Diagnosis not present

## 2016-03-27 DIAGNOSIS — D689 Coagulation defect, unspecified: Secondary | ICD-10-CM | POA: Diagnosis not present

## 2016-03-30 DIAGNOSIS — D689 Coagulation defect, unspecified: Secondary | ICD-10-CM | POA: Diagnosis not present

## 2016-03-30 DIAGNOSIS — N186 End stage renal disease: Secondary | ICD-10-CM | POA: Diagnosis not present

## 2016-03-30 DIAGNOSIS — E1129 Type 2 diabetes mellitus with other diabetic kidney complication: Secondary | ICD-10-CM | POA: Diagnosis not present

## 2016-03-30 DIAGNOSIS — N2581 Secondary hyperparathyroidism of renal origin: Secondary | ICD-10-CM | POA: Diagnosis not present

## 2016-04-01 DIAGNOSIS — N186 End stage renal disease: Secondary | ICD-10-CM | POA: Diagnosis not present

## 2016-04-01 DIAGNOSIS — E1129 Type 2 diabetes mellitus with other diabetic kidney complication: Secondary | ICD-10-CM | POA: Diagnosis not present

## 2016-04-01 DIAGNOSIS — D689 Coagulation defect, unspecified: Secondary | ICD-10-CM | POA: Diagnosis not present

## 2016-04-01 DIAGNOSIS — N2581 Secondary hyperparathyroidism of renal origin: Secondary | ICD-10-CM | POA: Diagnosis not present

## 2016-04-03 DIAGNOSIS — D689 Coagulation defect, unspecified: Secondary | ICD-10-CM | POA: Diagnosis not present

## 2016-04-03 DIAGNOSIS — N186 End stage renal disease: Secondary | ICD-10-CM | POA: Diagnosis not present

## 2016-04-03 DIAGNOSIS — E1129 Type 2 diabetes mellitus with other diabetic kidney complication: Secondary | ICD-10-CM | POA: Diagnosis not present

## 2016-04-03 DIAGNOSIS — N2581 Secondary hyperparathyroidism of renal origin: Secondary | ICD-10-CM | POA: Diagnosis not present

## 2016-04-06 DIAGNOSIS — D689 Coagulation defect, unspecified: Secondary | ICD-10-CM | POA: Diagnosis not present

## 2016-04-06 DIAGNOSIS — N186 End stage renal disease: Secondary | ICD-10-CM | POA: Diagnosis not present

## 2016-04-06 DIAGNOSIS — E1129 Type 2 diabetes mellitus with other diabetic kidney complication: Secondary | ICD-10-CM | POA: Diagnosis not present

## 2016-04-06 DIAGNOSIS — N2581 Secondary hyperparathyroidism of renal origin: Secondary | ICD-10-CM | POA: Diagnosis not present

## 2016-04-10 DIAGNOSIS — E1129 Type 2 diabetes mellitus with other diabetic kidney complication: Secondary | ICD-10-CM | POA: Diagnosis not present

## 2016-04-10 DIAGNOSIS — N2581 Secondary hyperparathyroidism of renal origin: Secondary | ICD-10-CM | POA: Diagnosis not present

## 2016-04-10 DIAGNOSIS — D689 Coagulation defect, unspecified: Secondary | ICD-10-CM | POA: Diagnosis not present

## 2016-04-10 DIAGNOSIS — N186 End stage renal disease: Secondary | ICD-10-CM | POA: Diagnosis not present

## 2016-04-12 DIAGNOSIS — N186 End stage renal disease: Secondary | ICD-10-CM | POA: Diagnosis not present

## 2016-04-12 DIAGNOSIS — Z992 Dependence on renal dialysis: Secondary | ICD-10-CM | POA: Diagnosis not present

## 2016-04-12 DIAGNOSIS — E1122 Type 2 diabetes mellitus with diabetic chronic kidney disease: Secondary | ICD-10-CM | POA: Diagnosis not present

## 2016-04-13 DIAGNOSIS — N2581 Secondary hyperparathyroidism of renal origin: Secondary | ICD-10-CM | POA: Diagnosis not present

## 2016-04-13 DIAGNOSIS — D689 Coagulation defect, unspecified: Secondary | ICD-10-CM | POA: Diagnosis not present

## 2016-04-13 DIAGNOSIS — D631 Anemia in chronic kidney disease: Secondary | ICD-10-CM | POA: Diagnosis not present

## 2016-04-13 DIAGNOSIS — E1129 Type 2 diabetes mellitus with other diabetic kidney complication: Secondary | ICD-10-CM | POA: Diagnosis not present

## 2016-04-13 DIAGNOSIS — N186 End stage renal disease: Secondary | ICD-10-CM | POA: Diagnosis not present

## 2016-04-15 DIAGNOSIS — D689 Coagulation defect, unspecified: Secondary | ICD-10-CM | POA: Diagnosis not present

## 2016-04-15 DIAGNOSIS — N2581 Secondary hyperparathyroidism of renal origin: Secondary | ICD-10-CM | POA: Diagnosis not present

## 2016-04-15 DIAGNOSIS — E1129 Type 2 diabetes mellitus with other diabetic kidney complication: Secondary | ICD-10-CM | POA: Diagnosis not present

## 2016-04-15 DIAGNOSIS — D631 Anemia in chronic kidney disease: Secondary | ICD-10-CM | POA: Diagnosis not present

## 2016-04-15 DIAGNOSIS — N186 End stage renal disease: Secondary | ICD-10-CM | POA: Diagnosis not present

## 2016-04-19 DIAGNOSIS — K219 Gastro-esophageal reflux disease without esophagitis: Secondary | ICD-10-CM | POA: Diagnosis not present

## 2016-04-19 DIAGNOSIS — E7800 Pure hypercholesterolemia, unspecified: Secondary | ICD-10-CM | POA: Diagnosis not present

## 2016-04-20 DIAGNOSIS — N2581 Secondary hyperparathyroidism of renal origin: Secondary | ICD-10-CM | POA: Diagnosis not present

## 2016-04-20 DIAGNOSIS — E1129 Type 2 diabetes mellitus with other diabetic kidney complication: Secondary | ICD-10-CM | POA: Diagnosis not present

## 2016-04-20 DIAGNOSIS — D631 Anemia in chronic kidney disease: Secondary | ICD-10-CM | POA: Diagnosis not present

## 2016-04-20 DIAGNOSIS — D689 Coagulation defect, unspecified: Secondary | ICD-10-CM | POA: Diagnosis not present

## 2016-04-20 DIAGNOSIS — N186 End stage renal disease: Secondary | ICD-10-CM | POA: Diagnosis not present

## 2016-04-21 ENCOUNTER — Inpatient Hospital Stay (HOSPITAL_COMMUNITY)
Admission: EM | Admit: 2016-04-21 | Discharge: 2016-04-22 | DRG: 391 | Disposition: A | Discharge status: Home / Self Care | Payer: Medicare Other | Attending: Internal Medicine | Admitting: Internal Medicine

## 2016-04-21 ENCOUNTER — Emergency Department (HOSPITAL_COMMUNITY): Payer: Medicare Other

## 2016-04-21 ENCOUNTER — Inpatient Hospital Stay (HOSPITAL_COMMUNITY): Payer: Medicare Other

## 2016-04-21 ENCOUNTER — Encounter (HOSPITAL_COMMUNITY): Payer: Self-pay | Admitting: Emergency Medicine

## 2016-04-21 DIAGNOSIS — R079 Chest pain, unspecified: Secondary | ICD-10-CM | POA: Diagnosis not present

## 2016-04-21 DIAGNOSIS — Z794 Long term (current) use of insulin: Secondary | ICD-10-CM | POA: Diagnosis present

## 2016-04-21 DIAGNOSIS — K5900 Constipation, unspecified: Secondary | ICD-10-CM | POA: Diagnosis not present

## 2016-04-21 DIAGNOSIS — Z8249 Family history of ischemic heart disease and other diseases of the circulatory system: Secondary | ICD-10-CM | POA: Diagnosis not present

## 2016-04-21 DIAGNOSIS — E119 Type 2 diabetes mellitus without complications: Secondary | ICD-10-CM | POA: Diagnosis present

## 2016-04-21 DIAGNOSIS — E875 Hyperkalemia: Secondary | ICD-10-CM | POA: Diagnosis not present

## 2016-04-21 DIAGNOSIS — Z87891 Personal history of nicotine dependence: Secondary | ICD-10-CM | POA: Diagnosis not present

## 2016-04-21 DIAGNOSIS — R109 Unspecified abdominal pain: Secondary | ICD-10-CM

## 2016-04-21 DIAGNOSIS — R1011 Right upper quadrant pain: Secondary | ICD-10-CM | POA: Diagnosis not present

## 2016-04-21 DIAGNOSIS — Z992 Dependence on renal dialysis: Secondary | ICD-10-CM | POA: Diagnosis not present

## 2016-04-21 DIAGNOSIS — E1122 Type 2 diabetes mellitus with diabetic chronic kidney disease: Secondary | ICD-10-CM | POA: Diagnosis not present

## 2016-04-21 DIAGNOSIS — E785 Hyperlipidemia, unspecified: Secondary | ICD-10-CM | POA: Diagnosis not present

## 2016-04-21 DIAGNOSIS — N186 End stage renal disease: Secondary | ICD-10-CM | POA: Diagnosis not present

## 2016-04-21 DIAGNOSIS — Z833 Family history of diabetes mellitus: Secondary | ICD-10-CM

## 2016-04-21 DIAGNOSIS — I120 Hypertensive chronic kidney disease with stage 5 chronic kidney disease or end stage renal disease: Secondary | ICD-10-CM | POA: Diagnosis present

## 2016-04-21 DIAGNOSIS — R112 Nausea with vomiting, unspecified: Principal | ICD-10-CM | POA: Diagnosis present

## 2016-04-21 DIAGNOSIS — R634 Abnormal weight loss: Secondary | ICD-10-CM | POA: Diagnosis not present

## 2016-04-21 DIAGNOSIS — N185 Chronic kidney disease, stage 5: Secondary | ICD-10-CM

## 2016-04-21 DIAGNOSIS — R1110 Vomiting, unspecified: Secondary | ICD-10-CM

## 2016-04-21 DIAGNOSIS — Z9115 Patient's noncompliance with renal dialysis: Secondary | ICD-10-CM | POA: Diagnosis not present

## 2016-04-21 LAB — BASIC METABOLIC PANEL
ANION GAP: 17 — AB (ref 5–15)
Anion gap: 15 (ref 5–15)
BUN: 51 mg/dL — ABNORMAL HIGH (ref 6–20)
BUN: 51 mg/dL — AB (ref 6–20)
CALCIUM: 9.3 mg/dL (ref 8.9–10.3)
CALCIUM: 9.2 mg/dL (ref 8.9–10.3)
CO2: 27 mmol/L (ref 22–32)
CO2: 31 mmol/L (ref 22–32)
CREATININE: 9.12 mg/dL — AB (ref 0.61–1.24)
CREATININE: 9.35 mg/dL — AB (ref 0.61–1.24)
Chloride: 92 mmol/L — ABNORMAL LOW (ref 101–111)
Chloride: 93 mmol/L — ABNORMAL LOW (ref 101–111)
GFR, EST AFRICAN AMERICAN: 6 mL/min — AB (ref >60)
GFR, EST AFRICAN AMERICAN: 6 mL/min — AB (ref >60)
GFR, EST NON AFRICAN AMERICAN: 5 mL/min — AB (ref >60)
GFR, EST NON AFRICAN AMERICAN: 5 mL/min — AB (ref >60)
GLUCOSE: 189 mg/dL — AB (ref 65–99)
Glucose, Bld: 196 mg/dL — ABNORMAL HIGH (ref 65–99)
Potassium: 6.9 mmol/L (ref 3.5–5.1)
Potassium: 4.8 mmol/L (ref 3.5–5.1)
SODIUM: 139 mmol/L (ref 135–145)
Sodium: 136 mmol/L (ref 135–145)

## 2016-04-21 LAB — HEPATIC FUNCTION PANEL
ALT: 20 U/L (ref 17–63)
AST: 29 U/L (ref 15–41)
Albumin: 3.8 g/dL (ref 3.5–5.0)
Alkaline Phosphatase: 79 U/L (ref 38–126)
BILIRUBIN DIRECT: 0.2 mg/dL (ref 0.1–0.5)
BILIRUBIN INDIRECT: 0.8 mg/dL (ref 0.3–0.9)
BILIRUBIN TOTAL: 1 mg/dL (ref 0.3–1.2)
Total Protein: 8.2 g/dL — ABNORMAL HIGH (ref 6.5–8.1)

## 2016-04-21 LAB — I-STAT TROPONIN, ED: TROPONIN I, POC: 0.02 ng/mL (ref 0.00–0.08)

## 2016-04-21 LAB — CBC
HCT: 37 % — ABNORMAL LOW (ref 39.0–52.0)
Hemoglobin: 11.9 g/dL — ABNORMAL LOW (ref 13.0–17.0)
MCH: 29.5 pg (ref 26.0–34.0)
MCHC: 32.2 g/dL (ref 30.0–36.0)
MCV: 91.6 fL (ref 78.0–100.0)
PLATELETS: 200 10*3/uL (ref 150–400)
RBC: 4.04 MIL/uL — ABNORMAL LOW (ref 4.22–5.81)
RDW: 12.7 % (ref 11.5–15.5)
WBC: 8.2 10*3/uL (ref 4.0–10.5)

## 2016-04-21 LAB — CBG MONITORING, ED
GLUCOSE-CAPILLARY: 134 mg/dL — AB (ref 65–99)
Glucose-Capillary: 137 mg/dL — ABNORMAL HIGH (ref 65–99)

## 2016-04-21 LAB — MRSA PCR SCREENING: MRSA by PCR: NEGATIVE

## 2016-04-21 LAB — GLUCOSE, CAPILLARY
GLUCOSE-CAPILLARY: 159 mg/dL — AB (ref 65–99)
GLUCOSE-CAPILLARY: 149 mg/dL — AB (ref 65–99)

## 2016-04-21 LAB — LIPASE, BLOOD: LIPASE: 67 U/L — AB (ref 11–51)

## 2016-04-21 MED ORDER — ALBUTEROL (5 MG/ML) CONTINUOUS INHALATION SOLN
20.0000 mg/h | INHALATION_SOLUTION | Freq: Once | RESPIRATORY_TRACT | Status: AC
  Administered 2016-04-21: 20 mg/h via RESPIRATORY_TRACT
  Filled 2016-04-21: qty 20

## 2016-04-21 MED ORDER — ONDANSETRON HCL 4 MG/2ML IJ SOLN
4.0000 mg | Freq: Once | INTRAMUSCULAR | Status: AC
  Administered 2016-04-21: 4 mg via INTRAVENOUS
  Filled 2016-04-21: qty 2

## 2016-04-21 MED ORDER — MILK AND MOLASSES ENEMA
1.0000 | Freq: Once | RECTAL | Status: AC
Start: 2016-04-21 — End: 2016-04-21
  Administered 2016-04-21: 250 mL via RECTAL
  Filled 2016-04-21: qty 250

## 2016-04-21 MED ORDER — SEVELAMER CARBONATE 800 MG PO TABS
800.0000 mg | ORAL_TABLET | Freq: Three times a day (TID) | ORAL | Status: DC
  Administered 2016-04-21 – 2016-04-22 (×2): 800 mg via ORAL
  Filled 2016-04-21 (×4): qty 1

## 2016-04-21 MED ORDER — INSULIN ASPART 100 UNIT/ML ~~LOC~~ SOLN
5.0000 [IU] | Freq: Once | SUBCUTANEOUS | Status: AC
  Administered 2016-04-21: 5 [IU] via INTRAVENOUS
  Filled 2016-04-21: qty 1

## 2016-04-21 MED ORDER — DULOXETINE HCL 20 MG PO CPEP
20.0000 mg | ORAL_CAPSULE | Freq: Every day | ORAL | Status: DC
  Administered 2016-04-21: 20 mg via ORAL
  Filled 2016-04-21 (×2): qty 1

## 2016-04-21 MED ORDER — HEPARIN SODIUM (PORCINE) 5000 UNIT/ML IJ SOLN
5000.0000 [IU] | Freq: Three times a day (TID) | INTRAMUSCULAR | Status: DC
  Administered 2016-04-21 – 2016-04-22 (×4): 5000 [IU] via SUBCUTANEOUS
  Filled 2016-04-21 (×4): qty 1

## 2016-04-21 MED ORDER — DOXERCALCIFEROL 4 MCG/2ML IV SOLN
2.0000 ug | INTRAVENOUS | Status: DC
  Administered 2016-04-22: 2 ug via INTRAVENOUS
  Filled 2016-04-21: qty 2

## 2016-04-21 MED ORDER — DEXTROSE 50 % IV SOLN
1.0000 | Freq: Once | INTRAVENOUS | Status: AC
Start: 2016-04-21 — End: 2016-04-21
  Administered 2016-04-21: 50 mL via INTRAVENOUS
  Filled 2016-04-21: qty 50

## 2016-04-21 MED ORDER — AMLODIPINE BESYLATE 10 MG PO TABS
10.0000 mg | ORAL_TABLET | Freq: Every day | ORAL | Status: DC
Start: 2016-04-21 — End: 2016-04-22
  Administered 2016-04-22: 10 mg via ORAL
  Filled 2016-04-21: qty 1
  Filled 2016-04-21: qty 2

## 2016-04-21 MED ORDER — ASPIRIN EC 81 MG PO TBEC
81.0000 mg | DELAYED_RELEASE_TABLET | Freq: Every day | ORAL | Status: DC
  Administered 2016-04-22: 81 mg via ORAL
  Filled 2016-04-21 (×2): qty 1

## 2016-04-21 MED ORDER — CINACALCET HCL 30 MG PO TABS
30.0000 mg | ORAL_TABLET | Freq: Every day | ORAL | Status: DC
  Administered 2016-04-21 – 2016-04-22 (×2): 30 mg via ORAL
  Filled 2016-04-21 (×2): qty 1

## 2016-04-21 MED ORDER — SODIUM CHLORIDE 0.9 % IV SOLN
1.0000 g | Freq: Once | INTRAVENOUS | Status: AC
  Administered 2016-04-21: 1 g via INTRAVENOUS
  Filled 2016-04-21: qty 10

## 2016-04-21 MED ORDER — PANTOPRAZOLE SODIUM 20 MG PO TBEC
20.0000 mg | DELAYED_RELEASE_TABLET | Freq: Every day | ORAL | Status: DC
  Administered 2016-04-21 – 2016-04-22 (×2): 20 mg via ORAL
  Filled 2016-04-21 (×2): qty 1

## 2016-04-21 MED ORDER — RENA-VITE PO TABS
1.0000 | ORAL_TABLET | Freq: Every day | ORAL | Status: DC
  Administered 2016-04-21: 1 via ORAL
  Filled 2016-04-21: qty 1

## 2016-04-21 MED ORDER — BUPROPION HCL ER (XL) 150 MG PO TB24
300.0000 mg | ORAL_TABLET | Freq: Every day | ORAL | Status: DC
  Administered 2016-04-22: 300 mg via ORAL
  Filled 2016-04-21 (×2): qty 2

## 2016-04-21 MED ORDER — METOCLOPRAMIDE HCL 5 MG/ML IJ SOLN
10.0000 mg | Freq: Four times a day (QID) | INTRAMUSCULAR | Status: DC | PRN
  Administered 2016-04-21 (×2): 10 mg via INTRAVENOUS
  Filled 2016-04-21 (×2): qty 2

## 2016-04-21 MED ORDER — INSULIN ASPART 100 UNIT/ML ~~LOC~~ SOLN
0.0000 [IU] | Freq: Three times a day (TID) | SUBCUTANEOUS | Status: DC
  Administered 2016-04-21 (×2): 1 [IU] via SUBCUTANEOUS
  Administered 2016-04-21: 2 [IU] via SUBCUTANEOUS
  Filled 2016-04-21 (×2): qty 1

## 2016-04-21 MED ORDER — BISACODYL 10 MG RE SUPP
10.0000 mg | Freq: Two times a day (BID) | RECTAL | Status: DC
  Administered 2016-04-21: 10 mg via RECTAL
  Filled 2016-04-21 (×3): qty 1

## 2016-04-21 MED ORDER — PANTOPRAZOLE SODIUM 20 MG PO TBEC: 20.0000 mg | DELAYED_RELEASE_TABLET | Freq: Every day | ORAL | Status: DC

## 2016-04-21 MED ORDER — NA FERRIC GLUC CPLX IN SUCROSE 12.5 MG/ML IV SOLN
62.5000 mg | INTRAVENOUS | Status: DC
  Filled 2016-04-21 (×2): qty 5

## 2016-04-21 MED ORDER — INSULIN ASPART 100 UNIT/ML ~~LOC~~ SOLN: 3.0000 [IU] | Freq: Three times a day (TID) | SUBCUTANEOUS | Status: DC

## 2016-04-21 MED ORDER — ONDANSETRON HCL 4 MG/2ML IJ SOLN
4.0000 mg | Freq: Four times a day (QID) | INTRAMUSCULAR | Status: DC | PRN
  Administered 2016-04-21 (×2): 4 mg via INTRAVENOUS
  Filled 2016-04-21 (×2): qty 2

## 2016-04-21 MED ORDER — ATORVASTATIN CALCIUM 20 MG PO TABS
20.0000 mg | ORAL_TABLET | Freq: Every day | ORAL | Status: DC
  Administered 2016-04-21: 20 mg via ORAL
  Filled 2016-04-21: qty 1

## 2016-04-21 MED ORDER — PROMETHAZINE HCL 25 MG/ML IJ SOLN
12.5000 mg | Freq: Four times a day (QID) | INTRAMUSCULAR | Status: DC | PRN
  Administered 2016-04-21: 12.5 mg via INTRAVENOUS
  Filled 2016-04-21 (×2): qty 1

## 2016-04-21 NOTE — ED Notes (Signed)
Pt taken to bathroom in wheelchair for a BM.

## 2016-04-21 NOTE — Progress Notes (Signed)
Milk & molasses enema given. Patient tolerated well.

## 2016-04-21 NOTE — Care Management Obs Status (Signed)
[place] NOTIFICATION   Patient Details  Name: JAYCEON ERDOS MRN: [id number] Date of Birth: [birth year]   Medicare Observation Status Notification Given:  Yes    Vergie Living, RN 04/21/2016, 3:08 PM

## 2016-04-21 NOTE — ED Notes (Signed)
Spoke with admitting, made aware pt is vomiting and not able to take his meds. Reports he will order phenergan and an enema.

## 2016-04-21 NOTE — ED Triage Notes (Signed)
Brought via EMS from home for c/o chest pain. Reports as having epigastric burning after vomiting.  Vomit on patients face on arrival.  Reports nausea relieved at this time.  Dialysis patient.  States I went to dialysis yesterday and was okay until I ate [employer].  Sick on stomach since.

## 2016-04-21 NOTE — ED Notes (Signed)
Pt wanted to attempt to eat breakfast, had a few bites of oatmeal and coffee but vomited shortly thereafter. Tray removed from patient did not want to eat any more.

## 2016-04-21 NOTE — ED Notes (Signed)
Pt vomiting again thinks it was b/c he moved for the cbg. Not yet scheduled for anymore meds.

## 2016-04-21 NOTE — ED Notes (Signed)
Ordered patient renal/carb modified breakfast tray.

## 2016-04-21 NOTE — ED Notes (Addendum)
Brought pt sprite zero to try and take pills. Pt unable to drink sprite due to feeling nauseated. Did not try to take pills. Pt vomiting after 1 sip of sprite. Paged admitting MD to make aware and inquire if further orders needed.

## 2016-04-21 NOTE — ED Notes (Signed)
CBG 134 

## 2016-04-21 NOTE — ED Notes (Signed)
Spoke with admitting MD. Reports will order KUB for patient and reglan. Also dialysis plans to dialyze him today per admitting.

## 2016-04-21 NOTE — H&P (Addendum)
History and Physical    Blake [name] [id number] DOB: [birth year] DOA: 04/21/2016   PCP: [name], MD Chief Complaint:  Chief Complaint  Patient presents with  . Abdominal Pain  . Emesis    HPI: Blake [name] is a 70 y.o. male with medical history significant of ESRD, supposed to be on dialysis TTS, last dialysis session was Thursday last week he admits to me.  On sat he was having N/V and didn't feel well so he skipped dialysis.  He also skipped another session yesterday as well.  He now presents to the ED with c/o abdominal pain, emesis, 1 day of fatigue, tingling in his hands.  Nothing makes symptoms better or worse.  Has tried nothing for symptoms at home.  ED Course: K 6.9 initially, EKG initially showing T wave peaking when compared to Feb.  Patient got insulin, glucose, calcium, and albuterol.  Repeat K is 4.8 and repeat EKG shows resolution of T wave changes.  Review of Systems: As per HPI otherwise 10 point review of systems negative.    Past Medical History:  Diagnosis Date  . Acute osteomyelitis of toe of right foot ([place]) 08/02/2015  . Diabetes mellitus without complication ([place])   . Hyperlipidemia   . Hypertension   . Renal disorder     Past Surgical History:  Procedure Laterality Date  . AMPUTATION TOE Right 08/04/2015   Procedure: RIGHT GREAT TOE AMPUTATION;  Surgeon: [name], MD;  Location: [place];  Service: Orthopedics;  Laterality: Right;  . AV FISTULA PLACEMENT Right 07/05/2014   Procedure: ARTERIOVENOUS BRACHIALCEPHALIC (AV) FISTULA CREATION;  Surgeon: Conrad Willits, MD;  Location: [place];  Service: Vascular;  Laterality: Right;  . BACK SURGERY    . ESOPHAGOGASTRODUODENOSCOPY (EGD) WITH PROPOFOL N/A 11/28/2015   Procedure: ESOPHAGOGASTRODUODENOSCOPY (EGD) WITH PROPOFOL;  Surgeon: [name], MD;  [facility];  Service: Endoscopy;  Laterality: N/A;  . INSERTION OF DIALYSIS CATHETER Left 07/05/2014   Procedure: INSERTION  OF DIALYSIS CATHETER-LEFT INTERNAL JUGULAR PLACEMENT;  Surgeon: Conrad Arkadelphia, MD;  Location: [place];  Service: Vascular;  Laterality: Left;  . REMOVAL OF A DIALYSIS CATHETER Right 07/05/2014   Procedure: REMOVAL OF A DIALYSIS CATHETER;  Surgeon: Conrad , MD;  Location: [place];  Service: Vascular;  Laterality: Right;     reports that he quit smoking about 36 years ago. His smoking use included Cigarettes. He quit after 5.00 years of use. He has never used smokeless tobacco. He reports that he does not drink alcohol or use drugs.  No Known Allergies  Family History  Problem Relation Age of Onset  . Diabetes Father   . Heart attack Father       Prior to Admission medications   Medication Sig Start Date End Date Taking? Authorizing Provider  amLODipine (NORVASC) 10 MG tablet Take 10 mg by mouth daily.  06/18/14  Yes Historical Provider, MD  aspirin EC 81 MG tablet Take 81 mg by mouth daily.   Yes Historical Provider, MD  atorvastatin (LIPITOR) 20 MG tablet Take 20 mg by mouth daily.   Yes Historical Provider, MD  buPROPion (WELLBUTRIN XL) 150 MG 24 hr tablet Take 300 mg by mouth daily.   Yes Historical Provider, MD  DULoxetine (CYMBALTA) 20 MG capsule Take 1 capsule (20 mg total) by mouth daily. Patient taking differently: Take 20 mg by mouth daily after supper.  07/09/14  Yes Albertine Patricia, MD  multivitamin (RENA-VIT) TABS tablet Take 1  tablet by mouth daily.   Yes Historical Provider, MD  NOVOLIN R RELION 100 UNIT/ML injection Inject 3-13 Units into the skin 3 (three) times daily with meals as needed for high blood sugar.  06/13/14  Yes Historical Provider, MD  pantoprazole (PROTONIX) 20 MG tablet Take 1 tablet (20 mg total) by mouth daily. 10/25/15  Yes Hanna Patel-Mills, PA-C  SENSIPAR 30 MG tablet Take 1 tablet by mouth daily. 07/23/15  Yes Historical Provider, MD  sevelamer carbonate (RENVELA) 800 MG tablet Take 1 tablet (800 mg total) by mouth 3 (three) times daily with meals.  08/23/14  Yes Conrad Manila, MD    Physical Exam: Vitals:   04/21/16 [id number] 04/21/16 0345 04/21/16 0415 04/21/16 0513  BP: 138/62 126/64 113/55   Pulse: 72 81 81 83  Resp: 13 22 16 14   SpO2: 100% 100% 94% 99%  Weight:      Height:          Constitutional: NAD, calm, comfortable Eyes: PERRL, lids and conjunctivae normal ENMT: Mucous membranes are moist. Posterior pharynx clear of any exudate or lesions.Normal dentition.  Neck: normal, supple, no masses, no thyromegaly Respiratory: clear to auscultation bilaterally, no wheezing, no crackles. Normal respiratory effort. No accessory muscle use.  Cardiovascular: Regular rate and rhythm, no murmurs / rubs / gallops. No extremity edema. 2+ pedal pulses. No carotid bruits.  Abdomen: no tenderness, no masses palpated. No hepatosplenomegaly. Bowel sounds positive.  Musculoskeletal: no clubbing / cyanosis. No joint deformity upper and lower extremities. Good ROM, no contractures. Normal muscle tone.  Skin: no rashes, lesions, ulcers. No induration Neurologic: CN 2-12 grossly intact. Sensation intact, DTR normal. Strength 5/5 in all 4.  Psychiatric: Normal judgment and insight. Alert and oriented x 3. Normal mood.    Labs on Admission: I have personally reviewed following labs and imaging studies  CBC:  Recent Labs Lab 04/21/16 0258  WBC 8.2  HGB 11.9*  HCT 37.0*  MCV 91.6  PLT [id number]   Basic Metabolic Panel:  Recent Labs Lab 04/21/16 0258 04/21/16 0517  NA 136 139  K 6.9* 4.8  CL 92* 93*  CO2 27 31  GLUCOSE 189* 196*  BUN 51* 51*  CREATININE 9.12* 9.35*  CALCIUM 9.3 9.2   GFR: Estimated Creatinine Clearance: 8.2 mL/min (by C-G formula based on SCr of 9.35 mg/dL). Liver Function Tests:  Recent Labs Lab 04/21/16 0258  AST 29  ALT 20  ALKPHOS 79  BILITOT 1.0  PROT 8.2*  ALBUMIN 3.8    Recent Labs Lab 04/21/16 0258  LIPASE 67*   No results for input(s): AMMONIA in the last 168 hours. Coagulation Profile: No  results for input(s): INR, PROTIME in the last 168 hours. Cardiac Enzymes: No results for input(s): CKTOTAL, CKMB, CKMBINDEX, TROPONINI in the last 168 hours. BNP (last 3 results) No results for input(s): PROBNP in the last 8760 hours. HbA1C: No results for input(s): HGBA1C in the last 72 hours. CBG: No results for input(s): GLUCAP in the last 168 hours. Lipid Profile: No results for input(s): CHOL, HDL, LDLCALC, TRIG, CHOLHDL, LDLDIRECT in the last 72 hours. Thyroid Function Tests: No results for input(s): TSH, T4TOTAL, FREET4, T3FREE, THYROIDAB in the last 72 hours. Anemia Panel: No results for input(s): VITAMINB12, FOLATE, FERRITIN, TIBC, IRON, RETICCTPCT in the last 72 hours. Urine analysis:    Component Value Date/Time   COLORURINE YELLOW 08/01/2015 [place] 08/01/2015 0642   LABSPEC 1.013 08/01/2015 0642   PHURINE 8.0 08/01/2015 [id number]  GLUCOSEU 100 (A) 08/01/2015 0642   HGBUR MODERATE (A) 08/01/2015 0642   BILIRUBINUR NEGATIVE 08/01/2015 0642   KETONESUR NEGATIVE 08/01/2015 0642   PROTEINUR 100 (A) 08/01/2015 0642   UROBILINOGEN 0.2 06/28/2014 1417   NITRITE NEGATIVE 08/01/2015 0642   LEUKOCYTESUR NEGATIVE 08/01/2015 0642   Sepsis Labs: @LABRCNTIP (procalcitonin:4,lacticidven:4) )No results found for this or any previous visit (from the past 240 hour(s)).   Radiological Exams on Admission: Dg Chest 2 View  Result Date: 04/21/2016 CLINICAL DATA:  Chest pain.  Vomiting and weakness. EXAM: CHEST  2 VIEW COMPARISON:  09/13/2015 FINDINGS: Heart size and mediastinal contours are normal. Chronic interstitial prominence, unchanged. Mild left lung base atelectasis. No focal airspace opacity, pleural effusion or pneumothorax. No acute osseous abnormality is seen. IMPRESSION: Left lung base atelectasis.  Stable chronic interstitial prominence. Electronically Signed   By: Jeb Levering M.D.   On: 04/21/2016 03:48   US Abdomen Limited Ruq  Result Date:  04/21/2016 CLINICAL DATA:  Initial evaluation for acute abdominal pain, nausea. EXAM: US ABDOMEN LIMITED - RIGHT UPPER QUADRANT COMPARISON:  Prior radiograph from 07/01/2014. FINDINGS: Gallbladder: No gallstones or wall thickening visualized. No sonographic Murphy sign noted by sonographer. Common bile duct: Diameter: 3 mm Liver: No focal lesion identified. Slightly increased echogenicity, suggestive of steatosis. IMPRESSION: 1. Normal sonographic evaluation of the gallbladder. No evidence for cholelithiasis, acute cholecystitis, or biliary dilatation. 2. Increased echogenicity within the liver, suggestive of steatosis. Electronically Signed   By: Jeannine Boga M.D.   On: 04/21/2016 05:00    EKG: Independently reviewed.  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 Mercy Moore consulted by EDP  K down to 4.8 on repeat, so current opinion is that initial 6.9 was false elevation and patient does not need emergent dialysis.  Did stress to patient that he needs not to skip dialysis under any circumstances and he is getting close to stopping his heart with these potassium levels.  DM2 -  Sensitive scale mealtime and SSI ac/hs  N/V -  Zofran  [place] abd/pelvis is NML  Gastroenteritis vs gastroparesis from DM  If persists, then may wish to do gastric emptying study.  Will hold off on ordering any reglan for now so as not to mask this.    DVT prophylaxis: Heparin [place] Code Status: Full Family Communication: No family in room Consults called: Nephro consulted by EDP Admission status: Admit to obs   [place], [facility] Pager [phone] from 7PM-7AM  If 7AM-7PM, please contact the day physician for the patient [email or web address] Password [email or web address]  04/21/2016, 6:11  AM

## 2016-04-21 NOTE — ED Notes (Signed)
Pt resting, nausea improved. Pt drowsy from phenergan, does not want enema at this time. Will continue to encourage.

## 2016-04-21 NOTE — Progress Notes (Signed)
PROGRESS NOTE                                                                                                                                                                                                             Patient Demographics:    Blake [name], is a 70 y.o. male, DOB - June 09, 1946, [id number]  Admit date - 04/21/2016   Admitting Physician Etta Quill, DO  Outpatient Primary MD for the patient is [name], MD  LOS - 1   Chief Complaint  Patient presents with  . Abdominal Pain  . Emesis       Brief Narrative  70 year old male with end-stage renal disease on hemodialysis, presents with nausea and vomiting, skipped hemodialysis on Saturday, continues to have nausea and vomiting in ED, ultrasound unremarkable, KUB significant for moderate stool burden.   Subjective:    Blake [name] today has, No headache, No chest pain, No abdominal pain -Still complains of nausea and vomiting, had small bowel movement after Dulcolax suppository.  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 - Continue with when necessary Phenergan, Zofran and Reglan - Will give molasses enema, continue with  Dulcolax suppository, as unable to tolerate by mouth laxatives secondary to vision and vomiting  Hyperkalemia due to missing dialysis  - Dr. Mercy Moore consulted by EDP, K down to 4.8 on repeat, so current opinion is that  initial 6.9 was false elevation and patient does not need emergent dialysis.                       DM2  - Sensitive scale mealtime and SSI ac/hs  Hypertension - Continue with Norvasc    Code Status : Full  Family Communication  : none at bedside  Disposition Plan  : home  Consults  :  renal  Procedures  : None  DVT Prophylaxis  :  Heparin   Lab Results  Component Value Date   PLT 200 04/21/2016    Antibiotics  :  Anti-infectives    None        Objective:   Vitals:   04/21/16 1153 04/21/16 1200 04/21/16 1330 04/21/16 1456  BP: 147/79 135/67 123/70 128/59  Pulse: 90 83 87 87  Resp: 23 13 15 16   SpO2: 100% 100% 100% 96%  Weight:      Height:        Wt Readings from Last 3 Encounters:  04/21/16 89.4 kg (197 lb)  11/28/15 88.5 kg (195 lb)  11/12/15 88.5 kg (195 lb)     Intake/Output Summary (Last 24 hours) at 04/21/16 1609 Last data filed at 04/21/16 1548  Gross per 24 hour  Intake                0 ml  Output              281 ml  Net             -281 ml     Physical Exam  Awake Alert, Oriented X 3, Appears uncomfortable secondary to nausea . Supple Neck,No JVD,  Symmetrical Chest wall movement, Good air movement bilaterally, CTAB RRR,No Gallops,Rubs or new Murmurs, No Parasternal Heave +ve B.Sounds, Abd Soft, No tenderness,No rebound - guarding or rigidity. No Cyanosis, Clubbing or edema, No new Rash or bruise     Data Review:    CBC  Recent Labs Lab 04/21/16 0258  WBC 8.2  HGB 11.9*  HCT 37.0*  PLT 200  MCV 91.6  MCH 29.5  MCHC 32.2  RDW 12.7    Chemistries   Recent Labs Lab 04/21/16 0258 04/21/16 0517  NA 136 139  K 6.9* 4.8  CL 92* 93*  CO2 27 31  GLUCOSE 189* 196*  BUN 51* 51*  CREATININE 9.12* 9.35*  CALCIUM 9.3 9.2  AST 29  --   ALT 20  --   ALKPHOS 79  --   BILITOT 1.0  --    ------------------------------------------------------------------------------------------------------------------ No results for  input(s): CHOL, HDL, LDLCALC, TRIG, CHOLHDL, LDLDIRECT in the last 72 hours.  Lab Results  Component Value Date   HGBA1C 6.0 (H) 07/02/2014   ------------------------------------------------------------------------------------------------------------------ No results for input(s): TSH, T4TOTAL, T3FREE, THYROIDAB in the last 72 hours.  Invalid input(s): FREET3 ------------------------------------------------------------------------------------------------------------------ No results for input(s): VITAMINB12, FOLATE, FERRITIN, TIBC, IRON, RETICCTPCT in the last 72 hours.  Coagulation profile No results for input(s): INR, PROTIME in the last 168 hours.  No results for input(s): DDIMER in the last 72 hours.  Cardiac Enzymes No results for input(s): CKMB, TROPONINI, MYOGLOBIN in the last 168 hours.  Invalid input(s): CK ------------------------------------------------------------------------------------------------------------------ No results found for: BNP  Inpatient Medications  Scheduled Meds: . amLODipine  10 mg Oral Daily  . aspirin EC  81 mg Oral Daily  . atorvastatin  20 mg Oral q1800  . bisacodyl  10 mg Rectal BID  . buPROPion  300 mg Oral Daily  . cinacalcet  30 mg Oral Q breakfast  . [START ON 04/22/2016] doxercalciferol  2 mcg Intravenous Q T,Th,Sa-HD  . DULoxetine  20 mg Oral QPC supper  . heparin  5,000 Units Subcutaneous Q8H  . insulin aspart  0-9 Units Subcutaneous TID WC  . insulin aspart  3 Units Subcutaneous TID WC  . milk and molasses  1 enema Rectal Once  . multivitamin  1 tablet Oral QHS  . pantoprazole  20 mg Oral Daily  . sevelamer carbonate  800 mg Oral TID WC   Continuous Infusions:  PRN Meds:.metoCLOPramide (REGLAN) injection, ondansetron (ZOFRAN) IV, promethazine  Micro Results No results found for this or any previous visit (from the past 240 hour(s)).  Radiology Reports Dg Chest 2 View  Result Date: 04/21/2016 CLINICAL DATA:  Chest pain.   Vomiting and weakness. EXAM: CHEST  2 VIEW COMPARISON:  09/13/2015 FINDINGS: Heart size and mediastinal contours are normal. Chronic interstitial prominence, unchanged. Mild left lung base atelectasis. No focal airspace opacity, pleural effusion or pneumothorax. No acute osseous abnormality is seen. IMPRESSION: Left lung base atelectasis.  Stable chronic interstitial prominence. Electronically Signed   By: Jeb Levering M.D.   On: 04/21/2016 03:48   Dg Abd 1 View  Result Date: 04/21/2016 CLINICAL DATA:  Nausea, vomiting and weight loss. EXAM: ABDOMEN - 1 VIEW COMPARISON:  07/01/2014 FINDINGS: No evidence of bowel obstruction or significant ileus. Moderate fecal material seen throughout the colon without evidence of fecal impaction. No radio-opaque calculi or other significant radiographic abnormality are seen. IMPRESSION: Moderate fecal material throughout the colon. No acute bowel obstruction identified. Electronically Signed   By: Aletta Edouard M.D.   On: 04/21/2016 11:24   US Abdomen Limited Ruq  Result Date: 04/21/2016 CLINICAL DATA:  Initial evaluation for acute abdominal pain, nausea. EXAM: US ABDOMEN LIMITED - RIGHT UPPER QUADRANT COMPARISON:  Prior radiograph from 07/01/2014. FINDINGS: Gallbladder: No gallstones or wall thickening visualized. No sonographic Murphy sign noted by sonographer. Common bile duct: Diameter: 3 mm Liver: No focal lesion identified. Slightly increased echogenicity, suggestive of steatosis. IMPRESSION: 1. Normal sonographic evaluation of the gallbladder. No evidence for cholelithiasis, acute cholecystitis, or biliary dilatation. 2. Increased echogenicity within the liver, suggestive of steatosis. Electronically Signed   By: Jeannine Boga M.D.   On: 04/21/2016 05:00     Anaisha Mago M.D on 04/21/2016 at 4:09 PM  Between 7am to 7pm - Pager - [phone]  After 7pm go to [email or web address] - password [facility]  [facility] -  Office  [phone]

## 2016-04-21 NOTE — ED Notes (Signed)
  CBG 137 

## 2016-04-21 NOTE — ED Notes (Signed)
Patient transported to X-ray 

## 2016-04-21 NOTE — ED Notes (Signed)
Pt watching TV, reports nausea has improved and is feeling much better after the reglan.

## 2016-04-21 NOTE — Progress Notes (Signed)
Patient arrived on unit via stretcher from ED. No family at bedside.  Telemetry placed per MD order and central telemetry notified.

## 2016-04-21 NOTE — Care Management CC44 (Signed)
Condition Code 44 Documentation Completed  Patient Details  Name: ANGELIS TOMICH MRN: [id number] Date of Birth: 23-Apr-1946   Condition Code 44 given:  Yes Patient signature on Condition Code 44 notice:  Yes (signed with X) Documentation of 2 MD's agreement:  Yes Code 44 added to claim:  Yes    Vergie Living, RN 04/21/2016, 3:08 PM

## 2016-04-21 NOTE — ED Notes (Signed)
Patient transported to Ultrasound 

## 2016-04-21 NOTE — ED Notes (Signed)
Report given to Eugene Garnet, RN 6E. Spoke with Admitting MD about molasses enema and if it was still needed. Told to send up to floor and MD will round on patient to determine if enema still needed since BM was successful.

## 2016-04-21 NOTE — ED Provider Notes (Signed)
[facility] DEPT Provider Note   CSN: [id number] Arrival date & time: 04/21/16  0221  First Provider Contact:  First MD Initiated Contact with Patient 04/21/16 0315     By signing my name below, I, Royce Macadamia, attest that this documentation has been prepared under the direction and in the presence of Gareth Morgan, MD . Electronically Signed: Royce Macadamia, [place]. 04/21/2016. 4:25 AM.   History   Chief Complaint Chief Complaint  Patient presents with  . Abdominal Pain  . Emesis    The history is provided by the patient. No language interpreter was used.    HPI Comments:  Blake [name] is a 70 y.o. male who with a history of DM, HTN, and Kidney disease presents to the Emergency Department complaining of nausea and vomiting that began 04/16/16 and became worse yesterday at 1200pm after eating a hamburger.  Pt has associated fatigue x 1 day, middle abdominal pain, and tingling in his hands. He also notes constipation; states his last BM was 1 day ago.  Pt denies LOC,SOB, dysuria, diarrhea, fever, chills, melena and blood in his stool.  He is on dialysis and had a treatment yesterday but notes he missed dialysis on 04/17/16 due to vomiting.  Pt denies history of abdominal surgery.      Past Medical History:  Diagnosis Date  . Acute osteomyelitis of toe of right foot ([place]) 08/02/2015  . Diabetes mellitus without complication ([place])   . Hyperlipidemia   . Hypertension   . Renal disorder     Patient Active Problem List   Diagnosis Date Noted  . Nausea and vomiting 04/21/2016  . Noncompliance with renal dialysis ([place]) 04/21/2016  . Hyperkalemia, diminished renal excretion 04/21/2016  . Nausea & vomiting 04/21/2016  . Encephalopathy, metabolic [id number]  . Acute osteomyelitis of toe of right foot ([place]) 08/02/2015  . Ulcer of toe of right foot ([place]) 08/01/2015  . Hypothermia 08/01/2015  . Fall 08/01/2015  . Hypoglycemia associated with diabetes ([place])  08/01/2015  . Occasional numbness/prickling/tingling of fingers  08/23/2014  . 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 Acute pulmonary edema ([name])   . ESRD on dialysis ([place]) 07/07/2014  . Depression 07/07/2014  . Neuropathic pain, leg, bilateral 07/07/2014  . Diabetic retinopathy ([place]) 07/07/2014  . Advance directive discussed with patient 07/07/2014  . Acute respiratory failure ([place]) 06/28/2014  . Scrotal pain 10/19/2012  . 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 Proteinuria 10/17/2012  . Hypoxia 10/17/2012  . Hypertension     Past Surgical History:  Procedure Laterality Date  . AMPUTATION TOE Right 08/04/2015   Procedure: RIGHT GREAT TOE AMPUTATION;  Surgeon: [name], MD;  Location: [facility];  Service: Orthopedics;  Laterality: Right;  . AV FISTULA PLACEMENT Right 07/05/2014   Procedure: ARTERIOVENOUS BRACHIALCEPHALIC (AV) FISTULA CREATION;  Surgeon: Conrad Sunflower, MD;  Location: [facility];  Service: Vascular;  Laterality: Right;  . BACK SURGERY    . ESOPHAGOGASTRODUODENOSCOPY (EGD) WITH PROPOFOL N/A 11/28/2015   Procedure: ESOPHAGOGASTRODUODENOSCOPY (EGD) WITH PROPOFOL;  Surgeon: [name], MD;  Location: [facility];  Service: Endoscopy;  Laterality: N/A;  . INSERTION OF DIALYSIS CATHETER Left 07/05/2014   Procedure: INSERTION OF DIALYSIS CATHETER-LEFT INTERNAL JUGULAR PLACEMENT;  Surgeon: Conrad [place], MD;  Location: [facility];  Service: Vascular;  Laterality: Left;  . REMOVAL OF A DIALYSIS CATHETER Right 07/05/2014   Procedure: REMOVAL OF A DIALYSIS CATHETER;  Surgeon: Conrad Shepherd, MD;  Location: [facility];  Service: Vascular;  Laterality: Right;       Home Medications    Prior to Admission medications   Medication Sig Start Date End Date Taking? Authorizing Provider  amLODipine (NORVASC) 10 MG tablet Take 10 mg by mouth daily.  06/18/14  Yes Historical Provider, MD  aspirin EC 81 MG tablet Take 81 mg by mouth daily.   Yes Historical Provider, MD    atorvastatin (LIPITOR) 20 MG tablet Take 20 mg by mouth daily.   Yes Historical Provider, MD  buPROPion (WELLBUTRIN XL) 150 MG 24 hr tablet Take 300 mg by mouth daily.   Yes Historical Provider, MD  DULoxetine (CYMBALTA) 20 MG capsule Take 1 capsule (20 mg total) by mouth daily. Patient taking differently: Take 20 mg by mouth daily after supper.  07/09/14  Yes Albertine Patricia, MD  multivitamin (RENA-VIT) TABS tablet Take 1 tablet by mouth daily.   Yes Historical Provider, MD  NOVOLIN R RELION 100 UNIT/ML injection Inject 3-13 Units into the skin 3 (three) times daily with meals as needed for high blood sugar.  06/13/14  Yes Historical Provider, MD  pantoprazole (PROTONIX) 20 MG tablet Take 1 tablet (20 mg total) by mouth daily. 10/25/15  Yes Hanna Patel-Mills, PA-C  SENSIPAR 30 MG tablet Take 1 tablet by mouth daily. 07/23/15  Yes Historical Provider, MD  sevelamer carbonate (RENVELA) 800 MG tablet Take 1 tablet (800 mg total) by mouth 3 (three) times daily with meals. 08/23/14  Yes Conrad Manchester, MD    Family History Family History  Problem Relation Age of Onset  . Diabetes Father   . Heart attack Father     Social History Social History  Substance Use Topics  . Smoking status: Former Smoker    Years: 5.00    Types: Cigarettes    Quit date: 08/24/1979  . Smokeless tobacco: Never Used  . Alcohol use No     Allergies   Review of patient's allergies indicates no known allergies.   Review of Systems Review of Systems  Constitutional: Positive for fatigue. Negative for chills and fever.  Respiratory: Negative for shortness of breath.   Gastrointestinal: Positive for abdominal pain, constipation, nausea and vomiting. Negative for blood in stool and diarrhea.  Genitourinary: Negative for dysuria.  Neurological: Negative for syncope, light-headedness and headaches.  All other systems reviewed and are negative.   Physical Exam Updated Vital Signs BP 128/59   Pulse 87   Resp  16   Ht 5\' 8"  (1.727 m)   Wt 197 lb (89.4 kg)   SpO2 96%   BMI 29.95 kg/m   Physical Exam  Constitutional: He appears well-nourished. He appears ill.  HENT:  Head: Normocephalic and atraumatic.  Eyes: Conjunctivae are normal.  Neck: Neck supple.  Cardiovascular: Normal rate and regular rhythm.   No murmur heard. Pulmonary/Chest: Effort normal and breath sounds normal. No respiratory distress.  Abdominal: Soft. There is tenderness.  RUQ tenderness, negative murphy's sign  Musculoskeletal: He exhibits no edema.  Neurological: He is alert.  Skin: Skin is warm and dry.  Psychiatric: He has a normal mood and affect.  Nursing note and vitals reviewed.    ED Treatments / Results   DIAGNOSTIC STUDIES:  Oxygen Saturation is 94% on RA, adequate by my interpretation.    COORDINATION OF CARE:  3:29 AM Discussed treatment plan with pt at bedside and pt agreed to plan. Labs (all labs ordered are listed, but only abnormal results are displayed)  Labs Reviewed  BASIC METABOLIC PANEL - Abnormal; Notable for the following:       Result Value   Potassium 6.9 (*)    Chloride 92 (*)    Glucose, Bld 189 (*)    BUN 51 (*)    Creatinine, Ser 9.12 (*)    GFR calc non Af Amer 5 (*)    GFR calc Af Amer 6 (*)    Anion gap 17 (*)    All other components within normal limits  CBC - Abnormal; Notable for the following:    RBC 4.04 (*)    Hemoglobin 11.9 (*)    HCT 37.0 (*)    All other components within normal limits  HEPATIC FUNCTION PANEL - Abnormal; Notable for the following:    Total Protein 8.2 (*)    All other components within normal limits  LIPASE, BLOOD - Abnormal; Notable for the following:    Lipase 67 (*)    All other components within normal limits  BASIC METABOLIC PANEL - Abnormal; Notable for the following:    Chloride 93 (*)    Glucose, Bld 196 (*)    BUN 51 (*)    Creatinine, Ser 9.35 (*)    GFR calc non Af Amer 5 (*)    GFR calc Af Amer 6 (*)    All other  components within normal limits  CBG MONITORING, ED - Abnormal; Notable for the following:    Glucose-Capillary 137 (*)    All other components within normal limits  CBG MONITORING, ED - Abnormal; Notable for the following:    Glucose-Capillary 134 (*)    All other components within normal limits  I-STAT TROPOININ, ED    EKG  EKG Interpretation  Date/Time:  Wednesday April 21 2016 05:55:53 EDT Ventricular Rate:  82 PR Interval:    QRS Duration: 89 QT Interval:  410 QTC Calculation: 479 R Axis:   42 Text Interpretation:  Sinus rhythm Nonspecific T abnormalities, lateral leads Borderline prolonged QT interval No significant change since last tracing T W are less pronounced in comparison to pror Confirmed by [facility] MD, [place] ([id number]) on 04/21/2016 3:41:21 PM       Radiology Dg Chest 2 View  Result Date: 04/21/2016 CLINICAL DATA:  Chest pain.  Vomiting and weakness. EXAM: CHEST  2 VIEW COMPARISON:  09/13/2015 FINDINGS: Heart size and mediastinal contours are normal. Chronic interstitial prominence, unchanged. Mild left lung base atelectasis. No focal airspace opacity, pleural effusion or pneumothorax. No acute osseous abnormality is seen. IMPRESSION: Left lung base atelectasis.  Stable chronic interstitial prominence. Electronically Signed   By: Jeb Levering M.D.   On: 04/21/2016 03:48   Dg Abd 1 View  Result Date: 04/21/2016 CLINICAL DATA:  Nausea, vomiting and weight loss. EXAM: ABDOMEN - 1 VIEW COMPARISON:  07/01/2014 FINDINGS: No evidence of bowel obstruction or significant ileus. Moderate fecal material seen throughout the colon without evidence of fecal impaction. No radio-opaque calculi or other significant radiographic abnormality are seen. IMPRESSION: Moderate fecal material throughout the colon. No acute bowel obstruction identified. Electronically Signed   By: Aletta Edouard M.D.   On: 04/21/2016 11:24   US Abdomen Limited Ruq  Result Date: 04/21/2016 CLINICAL DATA:   Initial evaluation for acute abdominal pain, nausea. EXAM: US ABDOMEN LIMITED - RIGHT UPPER QUADRANT COMPARISON:  Prior radiograph from 07/01/2014. FINDINGS: Gallbladder: No gallstones or wall thickening visualized. No sonographic Murphy sign noted by sonographer. Common bile duct: Diameter: 3 mm Liver: No focal lesion identified.  Slightly increased echogenicity, suggestive of steatosis. IMPRESSION: 1. Normal sonographic evaluation of the gallbladder. No evidence for cholelithiasis, acute cholecystitis, or biliary dilatation. 2. Increased echogenicity within the liver, suggestive of steatosis. Electronically Signed   By: Jeannine Boga M.D.   On: 04/21/2016 05:00    Procedures Procedures (including critical care time)  Medications Ordered in ED Medications  amLODipine (NORVASC) tablet 10 mg (10 mg Oral Not Given 04/21/16 1403)  aspirin EC tablet 81 mg (81 mg Oral Refused 04/21/16 1404)  atorvastatin (LIPITOR) tablet 20 mg (not administered)  buPROPion (WELLBUTRIN XL) 24 hr tablet 300 mg (300 mg Oral Refused 04/21/16 1404)  DULoxetine (CYMBALTA) DR capsule 20 mg (not administered)  sevelamer carbonate (RENVELA) tablet 800 mg (800 mg Oral Refused 04/21/16 1215)  cinacalcet (SENSIPAR) tablet 30 mg (30 mg Oral Given 04/21/16 0807)  multivitamin (RENA-VIT) tablet 1 tablet (not administered)  insulin aspart (novoLOG) injection 0-9 Units (1 Units Subcutaneous Given 04/21/16 1223)  insulin aspart (novoLOG) injection 3 Units (3 Units Subcutaneous Not Given 04/21/16 1216)  ondansetron (ZOFRAN) injection 4 mg (4 mg Intravenous Given 04/21/16 0634)  heparin injection 5,000 Units (5,000 Units Subcutaneous Given 04/21/16 0816)  doxercalciferol (HECTOROL) injection 2 mcg (not administered)  metoCLOPramide (REGLAN) injection 10 mg (10 mg Intravenous Given 04/21/16 1015)  promethazine (PHENERGAN) injection 12.5 mg (12.5 mg Intravenous Given 04/21/16 1253)  bisacodyl (DULCOLAX) suppository 10 mg (10 mg Rectal Given 04/21/16  1258)  milk and molasses enema (not administered)  ondansetron (ZOFRAN) injection 4 mg (4 mg Intravenous Given 04/21/16 0325)  ondansetron (ZOFRAN) injection 4 mg (4 mg Intravenous Given 04/21/16 0341)  insulin aspart (novoLOG) injection 5 Units (5 Units Intravenous Given 04/21/16 0428)  dextrose 50 % solution 50 mL (50 mLs Intravenous Given 04/21/16 0428)  albuterol (PROVENTIL,VENTOLIN) solution continuous neb (20 mg/hr Nebulization Given 04/21/16 0512)  calcium gluconate 1 g in sodium chloride 0.9 % 100 mL IVPB (0 g Intravenous Stopped 04/21/16 0602)     Initial Impression / Assessment and Plan / ED Course  I have reviewed the triage vital signs and the nursing notes.  Pertinent labs & imaging results that were available during my care of the patient were reviewed by me and considered in my medical decision making (see chart for details).  Clinical Course   70yo male with history of ESRD on dialysis Tues-Thurs-Sat, DM, htn, hlpd, presents with concern for nausea, vomiting and fatigue. Pt with RUQ tenderness on exam and given history, ordered RUQ [place] to evaluate for GB disease which was negative. No RLQ tenderness  Doubt appendicitis, doubt nephrolithiasis/perforation/diverticulitis/AAA.  Possible viral syndrome or gastroparesis.  Patient with K initially 6.9, given insulin/dextrose/albuterol. ECG did not appear to show peaked tw, however in comparison to ECG months ago they are more pronounced and ordered Ca. Nephrology was consulted and hospitalist called for admission.  Repeat K 4.8 and ECG with decreased TW amplitude.  Unclear if initial K was laboratory abnormality versus less likely rapid correction with interventions.  Pt to be admitted for further care of n/v.  Final Clinical Impressions(s) / ED Diagnoses   Final diagnoses:  Abdominal pain  Non-intractable vomiting with nausea, vomiting of unspecified type  Hyperkalemia    New Prescriptions Current Discharge Medication List     I  personally performed the services described in this documentation, which was scribed in my presence. The recorded information has been reviewed and is accurate.    Gareth Morgan, MD 04/21/16 [phone]

## 2016-04-21 NOTE — Consult Note (Signed)
Blake [name] is an 71 y.o. male referred by Dr Alcario Drought   Chief Complaint: N/V x5d HPI: 70yo BM with ESRD sec DM CO N/V since Sat.  Has been able to eat fruit but then vomits it up.  No abd pain.  No diarrhea.  No Hx gastroparesis.  Last HD was yest.  Initial K was 6.8 in ER which did not make sense if had HD yest and has had N/V.  He was tx with D5/insulin and albuterol.  Repeat K was 4.8.  Abd US unremarkable.  Past Medical History:  Diagnosis Date  . Acute osteomyelitis of toe of right foot ([place]) 08/02/2015  . Diabetes mellitus without complication ([place])   . Hyperlipidemia   . Hypertension   . Renal disorder     Past Surgical History:  Procedure Laterality Date  . AMPUTATION TOE Right 08/04/2015   Procedure: RIGHT GREAT TOE AMPUTATION;  Surgeon: [name], MD;  Location: [facility];  Service: Orthopedics;  Laterality: Right;  . AV FISTULA PLACEMENT Right 07/05/2014   Procedure: ARTERIOVENOUS BRACHIALCEPHALIC (AV) FISTULA CREATION;  Surgeon: Conrad Zumbro Falls, MD;  Location: [facility];  Service: Vascular;  Laterality: Right;  . BACK SURGERY    . ESOPHAGOGASTRODUODENOSCOPY (EGD) WITH PROPOFOL N/A 11/28/2015   Procedure: ESOPHAGOGASTRODUODENOSCOPY (EGD) WITH PROPOFOL;  Surgeon: [name], MD;  Location: [facility];  Service: Endoscopy;  Laterality: N/A;  . INSERTION OF DIALYSIS CATHETER Left 07/05/2014   Procedure: INSERTION OF DIALYSIS CATHETER-LEFT INTERNAL JUGULAR PLACEMENT;  Surgeon: Conrad Moreland Hills, MD;  Location: [facility];  Service: Vascular;  Laterality: Left;  . REMOVAL OF A DIALYSIS CATHETER Right 07/05/2014   Procedure: REMOVAL OF A DIALYSIS CATHETER;  Surgeon: Conrad Cook, MD;  Location: [facility];  Service: Vascular;  Laterality: Right;    Family History  Problem Relation Age of Onset  . Diabetes Father   . Heart attack Father    Social History:  reports that he quit smoking about 36 years ago. His smoking use included Cigarettes. He quit after 5.00 years of use. He has never  used smokeless tobacco. He reports that he does not drink alcohol or use drugs.  Allergies: No Known Allergies No current facility-administered medications on file prior to encounter.    Current Outpatient Prescriptions on File Prior to Encounter  Medication Sig Dispense Refill  . amLODipine (NORVASC) 10 MG tablet Take 10 mg by mouth daily.     Marland Kitchen aspirin EC 81 MG tablet Take 81 mg by mouth daily.    Marland Kitchen atorvastatin (LIPITOR) 20 MG tablet Take 20 mg by mouth daily.    Marland Kitchen buPROPion (WELLBUTRIN XL) 150 MG 24 hr tablet Take 300 mg by mouth daily.    . DULoxetine (CYMBALTA) 20 MG capsule Take 1 capsule (20 mg total) by mouth daily. (Patient taking differently: Take 20 mg by mouth daily after supper. )  3  . multivitamin (RENA-VIT) TABS tablet Take 1 tablet by mouth daily.    Marland Kitchen NOVOLIN R RELION 100 UNIT/ML injection Inject 3-13 Units into the skin 3 (three) times daily with meals as needed for high blood sugar.     . pantoprazole (PROTONIX) 20 MG tablet Take 1 tablet (20 mg total) by mouth daily. 60 tablet 0  . SENSIPAR 30 MG tablet Take 1 tablet by mouth daily.    . sevelamer carbonate (RENVELA) 800 MG tablet Take 1 tablet (800 mg total) by mouth 3 (three) times daily with meals. 90 tablet 11    (  Not in a hospital admission)   Lab Results: UA: ND  Recent Labs  04/21/16 0258  WBC 8.2  HGB 11.9*  HCT 37.0*  PLT 200   BMET  Recent Labs  04/21/16 0258 04/21/16 0517  NA 136 139  K 6.9* 4.8  CL 92* 93*  CO2 27 31  GLUCOSE 189* 196*  BUN 51* 51*  CREATININE 9.12* 9.35*  CALCIUM 9.3 9.2   LFT  Recent Labs  04/21/16 0258  PROT 8.2*  ALBUMIN 3.8  AST 29  ALT 20  ALKPHOS 79  BILITOT 1.0  BILIDIR 0.2  IBILI 0.8   Dg Chest 2 View  Result Date: 04/21/2016 CLINICAL DATA:  Chest pain.  Vomiting and weakness. EXAM: CHEST  2 VIEW COMPARISON:  09/13/2015 FINDINGS: Heart size and mediastinal contours are normal. Chronic interstitial prominence, unchanged. Mild left lung base  atelectasis. No focal airspace opacity, pleural effusion or pneumothorax. No acute osseous abnormality is seen. IMPRESSION: Left lung base atelectasis.  Stable chronic interstitial prominence. Electronically Signed   By: Jeb Levering M.D.   On: 04/21/2016 03:48   US Abdomen Limited Ruq  Result Date: 04/21/2016 CLINICAL DATA:  Initial evaluation for acute abdominal pain, nausea. EXAM: US ABDOMEN LIMITED - RIGHT UPPER QUADRANT COMPARISON:  Prior radiograph from 07/01/2014. FINDINGS: Gallbladder: No gallstones or wall thickening visualized. No sonographic Murphy sign noted by sonographer. Common bile duct: Diameter: 3 mm Liver: No focal lesion identified. Slightly increased echogenicity, suggestive of steatosis. IMPRESSION: 1. Normal sonographic evaluation of the gallbladder. No evidence for cholelithiasis, acute cholecystitis, or biliary dilatation. 2. Increased echogenicity within the liver, suggestive of steatosis. Electronically Signed   By: Jeannine Boga M.D.   On: 04/21/2016 05:00    ROS: No change in vision No SOB No edema No abd pain, + constipation No arthritic CO No dysuria   PHYSICAL EXAM: Blood pressure 107/57, pulse 83, resp. rate 26, height 5\' 8"  (1.727 m), weight 89.4 kg (197 lb), SpO2 100 %. HEENT: PERRLA EOMI NECK:No JVD LUNGS:clear CARDIAC:RRR wo MRG ABD:+ BS NTND No HSM EXT:No edema. Rt great toe amp, RUA AVF + bruit NEURO:CNI Ox3 Motor intact, no asterixis   HD [place]: 4hr, BFR 400, 2K, 3.5Ca, DW 87kg, hectorol 35mcg  Assessment: 1. N/V  ? Gastroparesis vs gastroenteritis 2. ESRD 3. DM 4. HTN PLAN: 1. Not convinced his high K was real as it has gone from 6.9 to 4.8 with simple measures. 2. Rec assess for gastroparesis 3. Will get info from his Dx unit 4. Plan HD in AM 5. Cont hectorol 6. Resume PO meds as tolerated   Trevyn Lumpkin T 04/21/2016, 6:13 AM

## 2016-04-22 DIAGNOSIS — E1122 Type 2 diabetes mellitus with diabetic chronic kidney disease: Secondary | ICD-10-CM

## 2016-04-22 DIAGNOSIS — N185 Chronic kidney disease, stage 5: Secondary | ICD-10-CM

## 2016-04-22 DIAGNOSIS — Z992 Dependence on renal dialysis: Secondary | ICD-10-CM | POA: Diagnosis not present

## 2016-04-22 DIAGNOSIS — Z8249 Family history of ischemic heart disease and other diseases of the circulatory system: Secondary | ICD-10-CM | POA: Diagnosis not present

## 2016-04-22 DIAGNOSIS — R112 Nausea with vomiting, unspecified: Principal | ICD-10-CM

## 2016-04-22 DIAGNOSIS — E875 Hyperkalemia: Secondary | ICD-10-CM

## 2016-04-22 DIAGNOSIS — Z833 Family history of diabetes mellitus: Secondary | ICD-10-CM | POA: Diagnosis not present

## 2016-04-22 DIAGNOSIS — Z87891 Personal history of nicotine dependence: Secondary | ICD-10-CM | POA: Diagnosis not present

## 2016-04-22 DIAGNOSIS — E785 Hyperlipidemia, unspecified: Secondary | ICD-10-CM | POA: Diagnosis not present

## 2016-04-22 DIAGNOSIS — K5900 Constipation, unspecified: Secondary | ICD-10-CM | POA: Diagnosis not present

## 2016-04-22 DIAGNOSIS — Z9115 Patient's noncompliance with renal dialysis: Secondary | ICD-10-CM | POA: Diagnosis not present

## 2016-04-22 DIAGNOSIS — I120 Hypertensive chronic kidney disease with stage 5 chronic kidney disease or end stage renal disease: Secondary | ICD-10-CM | POA: Diagnosis not present

## 2016-04-22 DIAGNOSIS — N186 End stage renal disease: Secondary | ICD-10-CM | POA: Diagnosis not present

## 2016-04-22 LAB — RENAL FUNCTION PANEL
ALBUMIN: 3.7 g/dL (ref 3.5–5.0)
ANION GAP: 16 — AB (ref 5–15)
BUN: 67 mg/dL — AB (ref 6–20)
CALCIUM: 8.5 mg/dL — AB (ref 8.9–10.3)
CO2: 32 mmol/L (ref 22–32)
Chloride: 92 mmol/L — ABNORMAL LOW (ref 101–111)
Creatinine, Ser: 11.8 mg/dL — ABNORMAL HIGH (ref 0.61–1.24)
GFR calc Af Amer: 4 mL/min — ABNORMAL LOW (ref >60)
GFR, EST NON AFRICAN AMERICAN: 4 mL/min — AB (ref >60)
GLUCOSE: 121 mg/dL — AB (ref 65–99)
PHOSPHORUS: 7.6 mg/dL — AB (ref 2.5–4.6)
Potassium: 4.8 mmol/L (ref 3.5–5.1)
SODIUM: 140 mmol/L (ref 135–145)

## 2016-04-22 LAB — CBC
HCT: 38.1 % — ABNORMAL LOW (ref 39.0–52.0)
Hemoglobin: 11.9 g/dL — ABNORMAL LOW (ref 13.0–17.0)
MCH: 29 pg (ref 26.0–34.0)
MCHC: 31.2 g/dL (ref 30.0–36.0)
MCV: 92.7 fL (ref 78.0–100.0)
Platelets: 186 10*3/uL (ref 150–400)
RBC: 4.11 MIL/uL — ABNORMAL LOW (ref 4.22–5.81)
RDW: 13 % (ref 11.5–15.5)
WBC: 8.3 10*3/uL (ref 4.0–10.5)

## 2016-04-22 LAB — GLUCOSE, CAPILLARY: GLUCOSE-CAPILLARY: 73 mg/dL (ref 65–99)

## 2016-04-22 MED ORDER — DOXERCALCIFEROL 4 MCG/2ML IV SOLN
2.0000 ug | INTRAVENOUS | Status: DC
  Filled 2016-04-22: qty 2

## 2016-04-22 MED ORDER — BISACODYL 10 MG RE SUPP: 10.0000 mg | Freq: Every day | RECTAL | 0 refills | Status: DC | PRN

## 2016-04-22 MED ORDER — DOXERCALCIFEROL 4 MCG/2ML IV SOLN
INTRAVENOUS | Status: AC
  Filled 2016-04-22: qty 2

## 2016-04-22 NOTE — Progress Notes (Signed)
Discharge instructions and medications discussed with patient.  All questions answered.  

## 2016-04-22 NOTE — Discharge Summary (Signed)
Physician Discharge Summary  Blake [name] [id number] DOB: 01-Mar-1946 DOA: 04/21/2016  PCP: [name], MD  Admit date: 04/21/2016 Discharge date: 04/22/2016  Time spent: 45 minutes  Recommendations for Outpatient Follow-up:  Patient will be discharged to home.  Patient will need to follow up with primary care provider within one week of discharge, discuss blood pressure management. Continue hemodialysis as scheduled.  Patient should continue medications as prescribed.  Patient should follow a renal/carb modified diet.    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 Jennette Kettle Blake [name] is a 70 y.o. male with medical history significant of ESRD, supposed to be on dialysis TTS, last dialysis session was Thursday last week he admits to me.  On sat he was having N/V and didn't feel well so he skipped dialysis.  He also skipped another session yesterday as well.  He now presents to the ED with c/o abdominal pain, emesis, 1 day of fatigue, tingling in his hands.  Nothing makes symptoms better or worse.  Has tried nothing for symptoms at home.  Hospital Course:  Nausea and vomiting -resolved -Unclear etiology, does not have significant uremia, borderline lipase, no history of gastroparesis but this possibility, workup significant for constipation and significant stool burden -Continue with when necessary Phenergan, Zofran and Reglan -Given molasses enema,  Dulcolax suppository as he was unable to  tolerate by mouth laxatives secondary to nausea and vomiting  Hyperkalemia due to missing dialysis  -resolved -Dr. Mercy Moore consulted by EDP, K down to 4.8 on repeat, so current opinion is that initial 6.9 was false elevation and patient does not need emergent dialysis.  Diabetes mellitus, type II -Continue Novolin (home regimen)  Hypertension -Patient is on norvasc, however, BP has been Low to normal -Continue norvasc for now, follow up with PCP regarding continuing medication  Procedures: None  Consultations: Nephrology  Discharge Exam: Vitals:   04/22/16 1100 04/22/16 1134  BP: (!) 100/52 (!) 118/51  Pulse: 84 80  Resp: 17 16  Temp: (!) 96.9 F (36.1 C) 98.5 F (36.9 C)     General: Well developed, well nourished, NAD, appears stated age  HEENT: NCAT,  mucous membranes moist.  Cardiovascular: S1 S2 auscultated, RRR, no murmurs appreciated   Respiratory: Clear to auscultation bilaterally with equal chest rise  Abdomen: Soft, nontender, nondistended, + bowel sounds  Extremities: warm dry without cyanosis clubbing or edema  Neuro: AAOx3, nonfocal  Psych: Normal affect and demeanor with intact judgement and insight  Discharge Instructions  Discharge Instructions    Discharge instructions    Complete by:  As directed   Patient will be discharged to home.  Patient will need to follow up with primary care provider within one week of discharge, discuss blood pressure management. Continue hemodialysis as scheduled.  Patient should continue medications as prescribed.  Patient should follow a renal/carb modified diet.       Medication List    STOP taking these medications   amLODipine 10 MG  tablet Commonly known as:  NORVASC     TAKE these medications   aspirin EC 81 MG tablet Take 81 mg by mouth daily.   atorvastatin 20 MG tablet Commonly known as:  LIPITOR Take 20 mg by mouth daily.   bisacodyl 10 MG suppository Commonly known as:  DULCOLAX Place  1 suppository (10 mg total) rectally daily as needed for moderate constipation.   buPROPion 150 MG 24 hr tablet Commonly known as:  WELLBUTRIN XL Take 300 mg by mouth daily.   DULoxetine 20 MG capsule Commonly known as:  CYMBALTA Take 1 capsule (20 mg total) by mouth daily. What changed:  when to take this   multivitamin Tabs tablet Take 1 tablet by mouth daily.   NOVOLIN R RELION 100 units/mL injection Generic drug:  insulin regular Inject 3-13 Units into the skin 3 (three) times daily with meals as needed for high blood sugar.   pantoprazole 20 MG tablet Commonly known as:  PROTONIX Take 1 tablet (20 mg total) by mouth daily.   SENSIPAR 30 MG tablet Generic drug:  cinacalcet Take 1 tablet by mouth daily.   sevelamer carbonate 800 MG tablet Commonly known as:  RENVELA Take 1 tablet (800 mg total) by mouth 3 (three) times daily with meals.      No Known Allergies Follow-up Information    RAMACHANDRAN,AJITH, MD. Schedule an appointment as soon as possible for a visit in 1 week(s).   Specialty:  Internal Medicine Why:  Hospital follow up Contact information: [address] [place] [address] [phone]            The results of significant diagnostics from this hospitalization (including imaging, microbiology, ancillary and laboratory) are listed below for reference.    Significant Diagnostic Studies: Dg Chest 2 View  Result Date: 04/21/2016 CLINICAL DATA:  Chest pain.  Vomiting and weakness. EXAM: CHEST  2 VIEW COMPARISON:  09/13/2015 FINDINGS: Heart size and mediastinal contours are normal. Chronic interstitial prominence, unchanged. Mild left lung base atelectasis. No focal airspace opacity, pleural effusion or pneumothorax. No acute osseous abnormality is seen. IMPRESSION: Left lung base atelectasis.  Stable chronic interstitial prominence. Electronically Signed   By: Jeb Levering M.D.   On: 04/21/2016 03:48   Dg Abd 1 View  Result  Date: 04/21/2016 CLINICAL DATA:  Nausea, vomiting and weight loss. EXAM: ABDOMEN - 1 VIEW COMPARISON:  07/01/2014 FINDINGS: No evidence of bowel obstruction or significant ileus. Moderate fecal material seen throughout the colon without evidence of fecal impaction. No radio-opaque calculi or other significant radiographic abnormality are seen. IMPRESSION: Moderate fecal material throughout the colon. No acute bowel obstruction identified. Electronically Signed   By: Aletta Edouard M.D.   On: 04/21/2016 11:24   US Abdomen Limited Ruq  Result Date: 04/21/2016 CLINICAL DATA:  Initial evaluation for acute abdominal pain, nausea. EXAM: US ABDOMEN LIMITED - RIGHT UPPER QUADRANT COMPARISON:  Prior radiograph from 07/01/2014. FINDINGS: Gallbladder: No gallstones or wall thickening visualized. No sonographic Murphy sign noted by sonographer. Common bile duct: Diameter: 3 mm Liver: No focal lesion identified. Slightly increased echogenicity, suggestive of steatosis. IMPRESSION: 1. Normal sonographic evaluation of the gallbladder. No evidence for cholelithiasis, acute cholecystitis, or biliary dilatation. 2. Increased echogenicity within the liver, suggestive of steatosis. Electronically Signed   By: Jeannine Boga M.D.   On: 04/21/2016 05:00    Microbiology: Recent Results (from the past 240 hour(s))  MRSA PCR Screening     Status: None   Collection Time:  04/21/16  3:53 PM  Result Value Ref Range Status   MRSA by PCR NEGATIVE NEGATIVE Final    Comment:        The GeneXpert MRSA Assay (FDA approved for NASAL specimens only), is one component of a comprehensive MRSA colonization surveillance program. It is not intended to diagnose MRSA infection nor to guide or monitor treatment for MRSA infections.      Labs: Basic Metabolic Panel:  Recent Labs Lab 04/21/16 0258 04/21/16 0517 04/22/16 0500  NA 136 139 140  K 6.9* 4.8 4.8  CL 92* 93* 92*  CO2 27 31 32  GLUCOSE 189* 196* 121*  BUN 51*  51* 67*  CREATININE 9.12* 9.35* 11.80*  CALCIUM 9.3 9.2 8.5*  PHOS  --   --  7.6*   Liver Function Tests:  Recent Labs Lab 04/21/16 0258 04/22/16 0500  AST 29  --   ALT 20  --   ALKPHOS 79  --   BILITOT 1.0  --   PROT 8.2*  --   ALBUMIN 3.8 3.7    Recent Labs Lab 04/21/16 0258  LIPASE 67*   No results for input(s): AMMONIA in the last 168 hours. CBC:  Recent Labs Lab 04/21/16 0258 04/22/16 0500  WBC 8.2 8.3  HGB 11.9* 11.9*  HCT 37.0* 38.1*  MCV 91.6 92.7  PLT 200 186   Cardiac Enzymes: No results for input(s): CKTOTAL, CKMB, CKMBINDEX, TROPONINI in the last 168 hours. BNP: BNP (last 3 results) No results for input(s): BNP in the last 8760 hours.  ProBNP (last 3 results) No results for input(s): PROBNP in the last 8760 hours.  CBG:  Recent Labs Lab 04/21/16 0809 04/21/16 1217 04/21/16 1708 04/21/16 2013 04/22/16 1133  GLUCAP 137* 134* 159* 149* 73       Signed:  Brookelle Pellicane  [facility] 04/22/2016, 12:44 PM

## 2016-04-22 NOTE — Discharge Instructions (Signed)
Constipation, Adult Constipation is when a person has fewer than three bowel movements a week, has difficulty having a bowel movement, or has stools that are dry, hard, or larger than normal. As people grow older, constipation is more common. A low-fiber diet, not taking in enough fluids, and taking certain medicines may make constipation worse.  CAUSES   Certain medicines, such as antidepressants, pain medicine, iron supplements, antacids, and water pills.   Certain diseases, such as diabetes, irritable bowel syndrome (IBS), thyroid disease, or depression.   Not drinking enough water.   Not eating enough fiber-rich foods.   Stress or travel.   Lack of physical activity or exercise.   Ignoring the urge to have a bowel movement.   Using laxatives too much.  SIGNS AND SYMPTOMS   Having fewer than three bowel movements a week.   Straining to have a bowel movement.   Having stools that are hard, dry, or larger than normal.   Feeling full or bloated.   Pain in the lower abdomen.   Not feeling relief after having a bowel movement.  DIAGNOSIS  Your health care provider will take a medical history and perform a physical exam. Further testing may be done for severe constipation. Some tests may include:  A barium enema X-ray to examine your rectum, colon, and, sometimes, your small intestine.   A sigmoidoscopy to examine your lower colon.   A colonoscopy to examine your entire colon. TREATMENT  Treatment will depend on the severity of your constipation and what is causing it. Some dietary treatments include drinking more fluids and eating more fiber-rich foods. Lifestyle treatments may include regular exercise. If these diet and lifestyle recommendations do not help, your health care provider may recommend taking over-the-counter laxative medicines to help you have bowel movements. Prescription medicines may be prescribed if over-the-counter medicines do not work.    HOME CARE INSTRUCTIONS   Eat foods that have a lot of fiber, such as fruits, vegetables, whole grains, and beans.  Limit foods high in fat and processed sugars, such as french fries, hamburgers, cookies, candies, and soda.   A fiber supplement may be added to your diet if you cannot get enough fiber from foods.   Drink enough fluids to keep your urine clear or pale yellow.   Exercise regularly or as directed by your health care provider.   Go to the restroom when you have the urge to go. Do not hold it.   Only take over-the-counter or prescription medicines as directed by your health care provider. Do not take other medicines for constipation without talking to your health care provider first.  [place] IF:   You have bright red blood in your stool.   Your constipation lasts for more than 4 days or gets worse.   You have abdominal or rectal pain.   You have thin, pencil-like stools.   You have unexplained weight loss. MAKE SURE YOU:   Understand these instructions.  Will watch your condition.  Will get help right away if you are not doing well or get worse.   This information is not intended to replace advice given to you by your health care provider. Make sure you discuss any questions you have with your health care provider.   Document Released: 05/28/2004 Document Revised: 09/20/2014 Document Reviewed: 06/11/2013 Elsevier Interactive Patient Education 2016 Elsevier Inc. Nausea and Vomiting Nausea is a sick feeling that often comes before throwing up (vomiting). Vomiting is a reflex where  stomach contents come out of your mouth. Vomiting can cause severe loss of body fluids (dehydration). Children and elderly adults can become dehydrated quickly, especially if they also have diarrhea. Nausea and vomiting are symptoms of a condition or disease. It is important to find the cause of your symptoms. CAUSES   Direct irritation of the stomach  lining. This irritation can result from increased acid production (gastroesophageal reflux disease), infection, food poisoning, taking certain medicines (such as nonsteroidal anti-inflammatory drugs), alcohol use, or tobacco use.  Signals from the brain.These signals could be caused by a headache, heat exposure, an inner ear disturbance, increased pressure in the brain from injury, infection, a tumor, or a concussion, pain, emotional stimulus, or metabolic problems.  An obstruction in the gastrointestinal tract (bowel obstruction).  Illnesses such as diabetes, hepatitis, gallbladder problems, appendicitis, kidney problems, cancer, sepsis, atypical symptoms of a heart attack, or eating disorders.  Medical treatments such as chemotherapy and radiation.  Receiving medicine that makes you sleep (general anesthetic) during surgery. DIAGNOSIS Your caregiver may ask for tests to be done if the problems do not improve after a few days. Tests may also be done if symptoms are severe or if the reason for the nausea and vomiting is not clear. Tests may include:  Urine tests.  Blood tests.  Stool tests.  Cultures (to look for evidence of infection).  X-rays or other imaging studies. Test results can help your caregiver make decisions about treatment or the need for additional tests. TREATMENT You need to stay well hydrated. Drink frequently but in small amounts.You may wish to drink water, sports drinks, clear broth, or eat frozen ice pops or gelatin dessert to help stay hydrated.When you eat, eating slowly may help prevent nausea.There are also some antinausea medicines that may help prevent nausea. HOME CARE INSTRUCTIONS   Take all medicine as directed by your caregiver.  If you do not have an appetite, do not force yourself to eat. However, you must continue to drink fluids.  If you have an appetite, eat a normal diet unless your caregiver tells you differently.  Eat a variety of complex  carbohydrates (rice, wheat, potatoes, bread), lean meats, yogurt, fruits, and vegetables.  Avoid high-fat foods because they are more difficult to digest.  Drink enough water and fluids to keep your urine clear or pale yellow.  If you are dehydrated, ask your caregiver for specific rehydration instructions. Signs of dehydration may include:  Severe thirst.  Dry lips and mouth.  Dizziness.  Dark urine.  Decreasing urine frequency and amount.  Confusion.  Rapid breathing or pulse. SEEK IMMEDIATE MEDICAL CARE IF:   You have blood or brown flecks (like coffee grounds) in your vomit.  You have black or bloody stools.  You have a severe headache or stiff neck.  You are confused.  You have severe abdominal pain.  You have chest pain or trouble breathing.  You do not urinate at least once every 8 hours.  You develop cold or clammy skin.  You continue to vomit for longer than 24 to 48 hours.  You have a fever. MAKE SURE YOU:   Understand these instructions.  Will watch your condition.  Will get help right away if you are not doing well or get worse.   This information is not intended to replace advice given to you by your health care provider. Make sure you discuss any questions you have with your health care provider.   Document Released: 08/30/2005 Document Revised: 11/22/2011  Document Reviewed: 01/27/2011 Elsevier Interactive Patient Education [employer].

## 2016-04-22 NOTE — Progress Notes (Signed)
Pt has been asymptomatic hypotensive since arriving to unit and after initiating dialysis, Turned UF off first 30 minutes of tx and boloused pt 27ml. Notified Dr Jonnie Finner and instructed to keep UF off and bolous up to 1 liter if needed.

## 2016-04-22 NOTE — Procedures (Signed)
BP's low in HD, below dry wt and prob vol depleted.  Giving NS in HD for low BP's, not removing any fluid.   I was present at this dialysis session, have reviewed the session itself and made  appropriate changes Kelly Splinter MD [facility] pager [phone]    cell [phone] 04/22/2016, 10:35 AM

## 2016-04-22 NOTE — Clinical Social Work Note (Addendum)
Patient discharging home today and transport requested. CSW provided nurse with taxi voucher for patient and she will contact taxi company to arrange transport.  Cacey Willow Givens, MSW, LCSW Licensed Clinical Social Worker [place] [phone]

## 2016-04-24 DIAGNOSIS — N186 End stage renal disease: Secondary | ICD-10-CM | POA: Diagnosis not present

## 2016-04-24 DIAGNOSIS — N2581 Secondary hyperparathyroidism of renal origin: Secondary | ICD-10-CM | POA: Diagnosis not present

## 2016-04-24 DIAGNOSIS — D631 Anemia in chronic kidney disease: Secondary | ICD-10-CM | POA: Diagnosis not present

## 2016-04-24 DIAGNOSIS — D689 Coagulation defect, unspecified: Secondary | ICD-10-CM | POA: Diagnosis not present

## 2016-04-24 DIAGNOSIS — E1129 Type 2 diabetes mellitus with other diabetic kidney complication: Secondary | ICD-10-CM | POA: Diagnosis not present

## 2016-04-26 DIAGNOSIS — I10 Essential (primary) hypertension: Secondary | ICD-10-CM | POA: Diagnosis not present

## 2016-04-26 DIAGNOSIS — E1165 Type 2 diabetes mellitus with hyperglycemia: Secondary | ICD-10-CM | POA: Diagnosis not present

## 2016-04-26 DIAGNOSIS — E7800 Pure hypercholesterolemia, unspecified: Secondary | ICD-10-CM | POA: Diagnosis not present

## 2016-04-26 DIAGNOSIS — Z125 Encounter for screening for malignant neoplasm of prostate: Secondary | ICD-10-CM | POA: Diagnosis not present

## 2016-04-26 DIAGNOSIS — N390 Urinary tract infection, site not specified: Secondary | ICD-10-CM | POA: Diagnosis not present

## 2016-04-27 DIAGNOSIS — E1129 Type 2 diabetes mellitus with other diabetic kidney complication: Secondary | ICD-10-CM | POA: Diagnosis not present

## 2016-04-27 DIAGNOSIS — D689 Coagulation defect, unspecified: Secondary | ICD-10-CM | POA: Diagnosis not present

## 2016-04-27 DIAGNOSIS — N2581 Secondary hyperparathyroidism of renal origin: Secondary | ICD-10-CM | POA: Diagnosis not present

## 2016-04-27 DIAGNOSIS — N186 End stage renal disease: Secondary | ICD-10-CM | POA: Diagnosis not present

## 2016-04-27 DIAGNOSIS — D631 Anemia in chronic kidney disease: Secondary | ICD-10-CM | POA: Diagnosis not present

## 2016-04-29 DIAGNOSIS — E1129 Type 2 diabetes mellitus with other diabetic kidney complication: Secondary | ICD-10-CM | POA: Diagnosis not present

## 2016-04-29 DIAGNOSIS — N2581 Secondary hyperparathyroidism of renal origin: Secondary | ICD-10-CM | POA: Diagnosis not present

## 2016-04-29 DIAGNOSIS — D689 Coagulation defect, unspecified: Secondary | ICD-10-CM | POA: Diagnosis not present

## 2016-04-29 DIAGNOSIS — D631 Anemia in chronic kidney disease: Secondary | ICD-10-CM | POA: Diagnosis not present

## 2016-04-29 DIAGNOSIS — N186 End stage renal disease: Secondary | ICD-10-CM | POA: Diagnosis not present

## 2016-05-04 DIAGNOSIS — D689 Coagulation defect, unspecified: Secondary | ICD-10-CM | POA: Diagnosis not present

## 2016-05-04 DIAGNOSIS — N2581 Secondary hyperparathyroidism of renal origin: Secondary | ICD-10-CM | POA: Diagnosis not present

## 2016-05-04 DIAGNOSIS — D631 Anemia in chronic kidney disease: Secondary | ICD-10-CM | POA: Diagnosis not present

## 2016-05-04 DIAGNOSIS — E1129 Type 2 diabetes mellitus with other diabetic kidney complication: Secondary | ICD-10-CM | POA: Diagnosis not present

## 2016-05-04 DIAGNOSIS — N186 End stage renal disease: Secondary | ICD-10-CM | POA: Diagnosis not present

## 2016-05-05 DIAGNOSIS — E1165 Type 2 diabetes mellitus with hyperglycemia: Secondary | ICD-10-CM | POA: Diagnosis not present

## 2016-05-05 DIAGNOSIS — N186 End stage renal disease: Secondary | ICD-10-CM | POA: Diagnosis not present

## 2016-05-05 DIAGNOSIS — Z23 Encounter for immunization: Secondary | ICD-10-CM | POA: Diagnosis not present

## 2016-05-05 DIAGNOSIS — E7800 Pure hypercholesterolemia, unspecified: Secondary | ICD-10-CM | POA: Diagnosis not present

## 2016-05-06 DIAGNOSIS — N186 End stage renal disease: Secondary | ICD-10-CM | POA: Diagnosis not present

## 2016-05-06 DIAGNOSIS — N2581 Secondary hyperparathyroidism of renal origin: Secondary | ICD-10-CM | POA: Diagnosis not present

## 2016-05-06 DIAGNOSIS — D631 Anemia in chronic kidney disease: Secondary | ICD-10-CM | POA: Diagnosis not present

## 2016-05-06 DIAGNOSIS — D689 Coagulation defect, unspecified: Secondary | ICD-10-CM | POA: Diagnosis not present

## 2016-05-06 DIAGNOSIS — E1129 Type 2 diabetes mellitus with other diabetic kidney complication: Secondary | ICD-10-CM | POA: Diagnosis not present

## 2016-05-08 DIAGNOSIS — N2581 Secondary hyperparathyroidism of renal origin: Secondary | ICD-10-CM | POA: Diagnosis not present

## 2016-05-08 DIAGNOSIS — D631 Anemia in chronic kidney disease: Secondary | ICD-10-CM | POA: Diagnosis not present

## 2016-05-08 DIAGNOSIS — E1129 Type 2 diabetes mellitus with other diabetic kidney complication: Secondary | ICD-10-CM | POA: Diagnosis not present

## 2016-05-08 DIAGNOSIS — N186 End stage renal disease: Secondary | ICD-10-CM | POA: Diagnosis not present

## 2016-05-08 DIAGNOSIS — D689 Coagulation defect, unspecified: Secondary | ICD-10-CM | POA: Diagnosis not present

## 2016-05-11 DIAGNOSIS — D631 Anemia in chronic kidney disease: Secondary | ICD-10-CM | POA: Diagnosis not present

## 2016-05-11 DIAGNOSIS — N186 End stage renal disease: Secondary | ICD-10-CM | POA: Diagnosis not present

## 2016-05-11 DIAGNOSIS — N2581 Secondary hyperparathyroidism of renal origin: Secondary | ICD-10-CM | POA: Diagnosis not present

## 2016-05-11 DIAGNOSIS — D689 Coagulation defect, unspecified: Secondary | ICD-10-CM | POA: Diagnosis not present

## 2016-05-11 DIAGNOSIS — E1129 Type 2 diabetes mellitus with other diabetic kidney complication: Secondary | ICD-10-CM | POA: Diagnosis not present

## 2016-05-13 DIAGNOSIS — N186 End stage renal disease: Secondary | ICD-10-CM | POA: Diagnosis not present

## 2016-05-13 DIAGNOSIS — E1122 Type 2 diabetes mellitus with diabetic chronic kidney disease: Secondary | ICD-10-CM | POA: Diagnosis not present

## 2016-05-13 DIAGNOSIS — Z992 Dependence on renal dialysis: Secondary | ICD-10-CM | POA: Diagnosis not present

## 2016-05-14 DIAGNOSIS — E1129 Type 2 diabetes mellitus with other diabetic kidney complication: Secondary | ICD-10-CM | POA: Diagnosis not present

## 2016-05-14 DIAGNOSIS — N186 End stage renal disease: Secondary | ICD-10-CM | POA: Diagnosis not present

## 2016-05-14 DIAGNOSIS — D689 Coagulation defect, unspecified: Secondary | ICD-10-CM | POA: Diagnosis not present

## 2016-05-14 DIAGNOSIS — N2581 Secondary hyperparathyroidism of renal origin: Secondary | ICD-10-CM | POA: Diagnosis not present

## 2016-05-14 DIAGNOSIS — Z4931 Encounter for adequacy testing for hemodialysis: Secondary | ICD-10-CM | POA: Diagnosis not present

## 2016-05-14 DIAGNOSIS — D631 Anemia in chronic kidney disease: Secondary | ICD-10-CM | POA: Diagnosis not present

## 2016-05-15 DIAGNOSIS — E1129 Type 2 diabetes mellitus with other diabetic kidney complication: Secondary | ICD-10-CM | POA: Diagnosis not present

## 2016-05-15 DIAGNOSIS — Z4931 Encounter for adequacy testing for hemodialysis: Secondary | ICD-10-CM | POA: Diagnosis not present

## 2016-05-15 DIAGNOSIS — N2581 Secondary hyperparathyroidism of renal origin: Secondary | ICD-10-CM | POA: Diagnosis not present

## 2016-05-15 DIAGNOSIS — N186 End stage renal disease: Secondary | ICD-10-CM | POA: Diagnosis not present

## 2016-05-15 DIAGNOSIS — D689 Coagulation defect, unspecified: Secondary | ICD-10-CM | POA: Diagnosis not present

## 2016-05-15 DIAGNOSIS — D631 Anemia in chronic kidney disease: Secondary | ICD-10-CM | POA: Diagnosis not present

## 2016-05-18 DIAGNOSIS — Z4931 Encounter for adequacy testing for hemodialysis: Secondary | ICD-10-CM | POA: Diagnosis not present

## 2016-05-18 DIAGNOSIS — N186 End stage renal disease: Secondary | ICD-10-CM | POA: Diagnosis not present

## 2016-05-18 DIAGNOSIS — N2581 Secondary hyperparathyroidism of renal origin: Secondary | ICD-10-CM | POA: Diagnosis not present

## 2016-05-18 DIAGNOSIS — D631 Anemia in chronic kidney disease: Secondary | ICD-10-CM | POA: Diagnosis not present

## 2016-05-18 DIAGNOSIS — E1129 Type 2 diabetes mellitus with other diabetic kidney complication: Secondary | ICD-10-CM | POA: Diagnosis not present

## 2016-05-18 DIAGNOSIS — D689 Coagulation defect, unspecified: Secondary | ICD-10-CM | POA: Diagnosis not present

## 2016-05-22 DIAGNOSIS — D689 Coagulation defect, unspecified: Secondary | ICD-10-CM | POA: Diagnosis not present

## 2016-05-22 DIAGNOSIS — N186 End stage renal disease: Secondary | ICD-10-CM | POA: Diagnosis not present

## 2016-05-22 DIAGNOSIS — N2581 Secondary hyperparathyroidism of renal origin: Secondary | ICD-10-CM | POA: Diagnosis not present

## 2016-05-22 DIAGNOSIS — Z4931 Encounter for adequacy testing for hemodialysis: Secondary | ICD-10-CM | POA: Diagnosis not present

## 2016-05-22 DIAGNOSIS — E1129 Type 2 diabetes mellitus with other diabetic kidney complication: Secondary | ICD-10-CM | POA: Diagnosis not present

## 2016-05-22 DIAGNOSIS — D631 Anemia in chronic kidney disease: Secondary | ICD-10-CM | POA: Diagnosis not present

## 2016-05-25 DIAGNOSIS — D689 Coagulation defect, unspecified: Secondary | ICD-10-CM | POA: Diagnosis not present

## 2016-05-25 DIAGNOSIS — N186 End stage renal disease: Secondary | ICD-10-CM | POA: Diagnosis not present

## 2016-05-25 DIAGNOSIS — N2581 Secondary hyperparathyroidism of renal origin: Secondary | ICD-10-CM | POA: Diagnosis not present

## 2016-05-25 DIAGNOSIS — D631 Anemia in chronic kidney disease: Secondary | ICD-10-CM | POA: Diagnosis not present

## 2016-05-25 DIAGNOSIS — E1129 Type 2 diabetes mellitus with other diabetic kidney complication: Secondary | ICD-10-CM | POA: Diagnosis not present

## 2016-05-25 DIAGNOSIS — Z4931 Encounter for adequacy testing for hemodialysis: Secondary | ICD-10-CM | POA: Diagnosis not present

## 2016-05-27 DIAGNOSIS — D689 Coagulation defect, unspecified: Secondary | ICD-10-CM | POA: Diagnosis not present

## 2016-05-27 DIAGNOSIS — N186 End stage renal disease: Secondary | ICD-10-CM | POA: Diagnosis not present

## 2016-05-27 DIAGNOSIS — Z4931 Encounter for adequacy testing for hemodialysis: Secondary | ICD-10-CM | POA: Diagnosis not present

## 2016-05-27 DIAGNOSIS — E1129 Type 2 diabetes mellitus with other diabetic kidney complication: Secondary | ICD-10-CM | POA: Diagnosis not present

## 2016-05-27 DIAGNOSIS — N2581 Secondary hyperparathyroidism of renal origin: Secondary | ICD-10-CM | POA: Diagnosis not present

## 2016-05-27 DIAGNOSIS — D631 Anemia in chronic kidney disease: Secondary | ICD-10-CM | POA: Diagnosis not present

## 2016-05-29 DIAGNOSIS — N2581 Secondary hyperparathyroidism of renal origin: Secondary | ICD-10-CM | POA: Diagnosis not present

## 2016-05-29 DIAGNOSIS — D689 Coagulation defect, unspecified: Secondary | ICD-10-CM | POA: Diagnosis not present

## 2016-05-29 DIAGNOSIS — Z4931 Encounter for adequacy testing for hemodialysis: Secondary | ICD-10-CM | POA: Diagnosis not present

## 2016-05-29 DIAGNOSIS — N186 End stage renal disease: Secondary | ICD-10-CM | POA: Diagnosis not present

## 2016-05-29 DIAGNOSIS — E1129 Type 2 diabetes mellitus with other diabetic kidney complication: Secondary | ICD-10-CM | POA: Diagnosis not present

## 2016-05-29 DIAGNOSIS — D631 Anemia in chronic kidney disease: Secondary | ICD-10-CM | POA: Diagnosis not present

## 2016-06-01 DIAGNOSIS — D631 Anemia in chronic kidney disease: Secondary | ICD-10-CM | POA: Diagnosis not present

## 2016-06-01 DIAGNOSIS — N186 End stage renal disease: Secondary | ICD-10-CM | POA: Diagnosis not present

## 2016-06-01 DIAGNOSIS — N2581 Secondary hyperparathyroidism of renal origin: Secondary | ICD-10-CM | POA: Diagnosis not present

## 2016-06-01 DIAGNOSIS — E1129 Type 2 diabetes mellitus with other diabetic kidney complication: Secondary | ICD-10-CM | POA: Diagnosis not present

## 2016-06-01 DIAGNOSIS — D689 Coagulation defect, unspecified: Secondary | ICD-10-CM | POA: Diagnosis not present

## 2016-06-01 DIAGNOSIS — Z4931 Encounter for adequacy testing for hemodialysis: Secondary | ICD-10-CM | POA: Diagnosis not present

## 2016-06-03 DIAGNOSIS — D689 Coagulation defect, unspecified: Secondary | ICD-10-CM | POA: Diagnosis not present

## 2016-06-03 DIAGNOSIS — N2581 Secondary hyperparathyroidism of renal origin: Secondary | ICD-10-CM | POA: Diagnosis not present

## 2016-06-03 DIAGNOSIS — D631 Anemia in chronic kidney disease: Secondary | ICD-10-CM | POA: Diagnosis not present

## 2016-06-03 DIAGNOSIS — Z4931 Encounter for adequacy testing for hemodialysis: Secondary | ICD-10-CM | POA: Diagnosis not present

## 2016-06-03 DIAGNOSIS — E1129 Type 2 diabetes mellitus with other diabetic kidney complication: Secondary | ICD-10-CM | POA: Diagnosis not present

## 2016-06-03 DIAGNOSIS — N186 End stage renal disease: Secondary | ICD-10-CM | POA: Diagnosis not present

## 2016-06-05 DIAGNOSIS — Z4931 Encounter for adequacy testing for hemodialysis: Secondary | ICD-10-CM | POA: Diagnosis not present

## 2016-06-05 DIAGNOSIS — E1129 Type 2 diabetes mellitus with other diabetic kidney complication: Secondary | ICD-10-CM | POA: Diagnosis not present

## 2016-06-05 DIAGNOSIS — N186 End stage renal disease: Secondary | ICD-10-CM | POA: Diagnosis not present

## 2016-06-05 DIAGNOSIS — D689 Coagulation defect, unspecified: Secondary | ICD-10-CM | POA: Diagnosis not present

## 2016-06-05 DIAGNOSIS — D631 Anemia in chronic kidney disease: Secondary | ICD-10-CM | POA: Diagnosis not present

## 2016-06-05 DIAGNOSIS — N2581 Secondary hyperparathyroidism of renal origin: Secondary | ICD-10-CM | POA: Diagnosis not present

## 2016-06-08 DIAGNOSIS — E1129 Type 2 diabetes mellitus with other diabetic kidney complication: Secondary | ICD-10-CM | POA: Diagnosis not present

## 2016-06-08 DIAGNOSIS — D689 Coagulation defect, unspecified: Secondary | ICD-10-CM | POA: Diagnosis not present

## 2016-06-08 DIAGNOSIS — Z4931 Encounter for adequacy testing for hemodialysis: Secondary | ICD-10-CM | POA: Diagnosis not present

## 2016-06-08 DIAGNOSIS — N2581 Secondary hyperparathyroidism of renal origin: Secondary | ICD-10-CM | POA: Diagnosis not present

## 2016-06-08 DIAGNOSIS — N186 End stage renal disease: Secondary | ICD-10-CM | POA: Diagnosis not present

## 2016-06-08 DIAGNOSIS — D631 Anemia in chronic kidney disease: Secondary | ICD-10-CM | POA: Diagnosis not present

## 2016-06-11 DIAGNOSIS — T82868D Thrombosis of vascular prosthetic devices, implants and grafts, subsequent encounter: Secondary | ICD-10-CM | POA: Diagnosis not present

## 2016-06-11 DIAGNOSIS — N186 End stage renal disease: Secondary | ICD-10-CM | POA: Diagnosis not present

## 2016-06-11 DIAGNOSIS — Z992 Dependence on renal dialysis: Secondary | ICD-10-CM | POA: Diagnosis not present

## 2016-06-11 DIAGNOSIS — I871 Compression of vein: Secondary | ICD-10-CM | POA: Diagnosis not present

## 2016-06-12 DIAGNOSIS — D689 Coagulation defect, unspecified: Secondary | ICD-10-CM | POA: Diagnosis not present

## 2016-06-12 DIAGNOSIS — E1129 Type 2 diabetes mellitus with other diabetic kidney complication: Secondary | ICD-10-CM | POA: Diagnosis not present

## 2016-06-12 DIAGNOSIS — D631 Anemia in chronic kidney disease: Secondary | ICD-10-CM | POA: Diagnosis not present

## 2016-06-12 DIAGNOSIS — N2581 Secondary hyperparathyroidism of renal origin: Secondary | ICD-10-CM | POA: Diagnosis not present

## 2016-06-12 DIAGNOSIS — Z4931 Encounter for adequacy testing for hemodialysis: Secondary | ICD-10-CM | POA: Diagnosis not present

## 2016-06-12 DIAGNOSIS — E1122 Type 2 diabetes mellitus with diabetic chronic kidney disease: Secondary | ICD-10-CM | POA: Diagnosis not present

## 2016-06-12 DIAGNOSIS — Z992 Dependence on renal dialysis: Secondary | ICD-10-CM | POA: Diagnosis not present

## 2016-06-12 DIAGNOSIS — N186 End stage renal disease: Secondary | ICD-10-CM | POA: Diagnosis not present

## 2016-06-15 DIAGNOSIS — N2581 Secondary hyperparathyroidism of renal origin: Secondary | ICD-10-CM | POA: Diagnosis not present

## 2016-06-15 DIAGNOSIS — N186 End stage renal disease: Secondary | ICD-10-CM | POA: Diagnosis not present

## 2016-06-15 DIAGNOSIS — E1129 Type 2 diabetes mellitus with other diabetic kidney complication: Secondary | ICD-10-CM | POA: Diagnosis not present

## 2016-06-15 DIAGNOSIS — D689 Coagulation defect, unspecified: Secondary | ICD-10-CM | POA: Diagnosis not present

## 2016-06-15 DIAGNOSIS — D631 Anemia in chronic kidney disease: Secondary | ICD-10-CM | POA: Diagnosis not present

## 2016-06-17 DIAGNOSIS — N2581 Secondary hyperparathyroidism of renal origin: Secondary | ICD-10-CM | POA: Diagnosis not present

## 2016-06-17 DIAGNOSIS — E1129 Type 2 diabetes mellitus with other diabetic kidney complication: Secondary | ICD-10-CM | POA: Diagnosis not present

## 2016-06-17 DIAGNOSIS — N186 End stage renal disease: Secondary | ICD-10-CM | POA: Diagnosis not present

## 2016-06-17 DIAGNOSIS — D689 Coagulation defect, unspecified: Secondary | ICD-10-CM | POA: Diagnosis not present

## 2016-06-17 DIAGNOSIS — D631 Anemia in chronic kidney disease: Secondary | ICD-10-CM | POA: Diagnosis not present

## 2016-06-19 DIAGNOSIS — E1129 Type 2 diabetes mellitus with other diabetic kidney complication: Secondary | ICD-10-CM | POA: Diagnosis not present

## 2016-06-19 DIAGNOSIS — N2581 Secondary hyperparathyroidism of renal origin: Secondary | ICD-10-CM | POA: Diagnosis not present

## 2016-06-19 DIAGNOSIS — D689 Coagulation defect, unspecified: Secondary | ICD-10-CM | POA: Diagnosis not present

## 2016-06-19 DIAGNOSIS — D631 Anemia in chronic kidney disease: Secondary | ICD-10-CM | POA: Diagnosis not present

## 2016-06-19 DIAGNOSIS — N186 End stage renal disease: Secondary | ICD-10-CM | POA: Diagnosis not present

## 2016-06-22 DIAGNOSIS — N186 End stage renal disease: Secondary | ICD-10-CM | POA: Diagnosis not present

## 2016-06-22 DIAGNOSIS — E1129 Type 2 diabetes mellitus with other diabetic kidney complication: Secondary | ICD-10-CM | POA: Diagnosis not present

## 2016-06-22 DIAGNOSIS — N2581 Secondary hyperparathyroidism of renal origin: Secondary | ICD-10-CM | POA: Diagnosis not present

## 2016-06-22 DIAGNOSIS — D689 Coagulation defect, unspecified: Secondary | ICD-10-CM | POA: Diagnosis not present

## 2016-06-22 DIAGNOSIS — D631 Anemia in chronic kidney disease: Secondary | ICD-10-CM | POA: Diagnosis not present

## 2016-06-24 DIAGNOSIS — N186 End stage renal disease: Secondary | ICD-10-CM | POA: Diagnosis not present

## 2016-06-24 DIAGNOSIS — D689 Coagulation defect, unspecified: Secondary | ICD-10-CM | POA: Diagnosis not present

## 2016-06-24 DIAGNOSIS — N2581 Secondary hyperparathyroidism of renal origin: Secondary | ICD-10-CM | POA: Diagnosis not present

## 2016-06-24 DIAGNOSIS — D631 Anemia in chronic kidney disease: Secondary | ICD-10-CM | POA: Diagnosis not present

## 2016-06-24 DIAGNOSIS — E1129 Type 2 diabetes mellitus with other diabetic kidney complication: Secondary | ICD-10-CM | POA: Diagnosis not present

## 2016-07-01 DIAGNOSIS — N186 End stage renal disease: Secondary | ICD-10-CM | POA: Diagnosis not present

## 2016-07-01 DIAGNOSIS — N2581 Secondary hyperparathyroidism of renal origin: Secondary | ICD-10-CM | POA: Diagnosis not present

## 2016-07-01 DIAGNOSIS — D631 Anemia in chronic kidney disease: Secondary | ICD-10-CM | POA: Diagnosis not present

## 2016-07-01 DIAGNOSIS — D689 Coagulation defect, unspecified: Secondary | ICD-10-CM | POA: Diagnosis not present

## 2016-07-01 DIAGNOSIS — E1129 Type 2 diabetes mellitus with other diabetic kidney complication: Secondary | ICD-10-CM | POA: Diagnosis not present

## 2016-07-03 DIAGNOSIS — D631 Anemia in chronic kidney disease: Secondary | ICD-10-CM | POA: Diagnosis not present

## 2016-07-03 DIAGNOSIS — D689 Coagulation defect, unspecified: Secondary | ICD-10-CM | POA: Diagnosis not present

## 2016-07-03 DIAGNOSIS — N2581 Secondary hyperparathyroidism of renal origin: Secondary | ICD-10-CM | POA: Diagnosis not present

## 2016-07-03 DIAGNOSIS — E1129 Type 2 diabetes mellitus with other diabetic kidney complication: Secondary | ICD-10-CM | POA: Diagnosis not present

## 2016-07-03 DIAGNOSIS — N186 End stage renal disease: Secondary | ICD-10-CM | POA: Diagnosis not present

## 2016-07-06 DIAGNOSIS — E1129 Type 2 diabetes mellitus with other diabetic kidney complication: Secondary | ICD-10-CM | POA: Diagnosis not present

## 2016-07-06 DIAGNOSIS — N186 End stage renal disease: Secondary | ICD-10-CM | POA: Diagnosis not present

## 2016-07-06 DIAGNOSIS — D631 Anemia in chronic kidney disease: Secondary | ICD-10-CM | POA: Diagnosis not present

## 2016-07-06 DIAGNOSIS — N2581 Secondary hyperparathyroidism of renal origin: Secondary | ICD-10-CM | POA: Diagnosis not present

## 2016-07-06 DIAGNOSIS — D689 Coagulation defect, unspecified: Secondary | ICD-10-CM | POA: Diagnosis not present

## 2016-07-08 DIAGNOSIS — D689 Coagulation defect, unspecified: Secondary | ICD-10-CM | POA: Diagnosis not present

## 2016-07-08 DIAGNOSIS — N2581 Secondary hyperparathyroidism of renal origin: Secondary | ICD-10-CM | POA: Diagnosis not present

## 2016-07-08 DIAGNOSIS — N186 End stage renal disease: Secondary | ICD-10-CM | POA: Diagnosis not present

## 2016-07-08 DIAGNOSIS — D631 Anemia in chronic kidney disease: Secondary | ICD-10-CM | POA: Diagnosis not present

## 2016-07-08 DIAGNOSIS — E1129 Type 2 diabetes mellitus with other diabetic kidney complication: Secondary | ICD-10-CM | POA: Diagnosis not present

## 2016-07-10 DIAGNOSIS — E1129 Type 2 diabetes mellitus with other diabetic kidney complication: Secondary | ICD-10-CM | POA: Diagnosis not present

## 2016-07-10 DIAGNOSIS — D689 Coagulation defect, unspecified: Secondary | ICD-10-CM | POA: Diagnosis not present

## 2016-07-10 DIAGNOSIS — N2581 Secondary hyperparathyroidism of renal origin: Secondary | ICD-10-CM | POA: Diagnosis not present

## 2016-07-10 DIAGNOSIS — N186 End stage renal disease: Secondary | ICD-10-CM | POA: Diagnosis not present

## 2016-07-10 DIAGNOSIS — D631 Anemia in chronic kidney disease: Secondary | ICD-10-CM | POA: Diagnosis not present

## 2016-07-13 DIAGNOSIS — N2581 Secondary hyperparathyroidism of renal origin: Secondary | ICD-10-CM | POA: Diagnosis not present

## 2016-07-13 DIAGNOSIS — D631 Anemia in chronic kidney disease: Secondary | ICD-10-CM | POA: Diagnosis not present

## 2016-07-13 DIAGNOSIS — D689 Coagulation defect, unspecified: Secondary | ICD-10-CM | POA: Diagnosis not present

## 2016-07-13 DIAGNOSIS — E1122 Type 2 diabetes mellitus with diabetic chronic kidney disease: Secondary | ICD-10-CM | POA: Diagnosis not present

## 2016-07-13 DIAGNOSIS — N186 End stage renal disease: Secondary | ICD-10-CM | POA: Diagnosis not present

## 2016-07-13 DIAGNOSIS — E1129 Type 2 diabetes mellitus with other diabetic kidney complication: Secondary | ICD-10-CM | POA: Diagnosis not present

## 2016-07-13 DIAGNOSIS — Z992 Dependence on renal dialysis: Secondary | ICD-10-CM | POA: Diagnosis not present

## 2016-07-14 DIAGNOSIS — Z992 Dependence on renal dialysis: Secondary | ICD-10-CM | POA: Diagnosis not present

## 2016-07-14 DIAGNOSIS — T82868D Thrombosis of vascular prosthetic devices, implants and grafts, subsequent encounter: Secondary | ICD-10-CM | POA: Diagnosis not present

## 2016-07-14 DIAGNOSIS — N186 End stage renal disease: Secondary | ICD-10-CM | POA: Diagnosis not present

## 2016-07-14 DIAGNOSIS — I871 Compression of vein: Secondary | ICD-10-CM | POA: Diagnosis not present

## 2016-07-15 DIAGNOSIS — E1129 Type 2 diabetes mellitus with other diabetic kidney complication: Secondary | ICD-10-CM | POA: Diagnosis not present

## 2016-07-15 DIAGNOSIS — D689 Coagulation defect, unspecified: Secondary | ICD-10-CM | POA: Diagnosis not present

## 2016-07-15 DIAGNOSIS — D631 Anemia in chronic kidney disease: Secondary | ICD-10-CM | POA: Diagnosis not present

## 2016-07-15 DIAGNOSIS — N2581 Secondary hyperparathyroidism of renal origin: Secondary | ICD-10-CM | POA: Diagnosis not present

## 2016-07-15 DIAGNOSIS — N186 End stage renal disease: Secondary | ICD-10-CM | POA: Diagnosis not present

## 2016-07-20 DIAGNOSIS — E1129 Type 2 diabetes mellitus with other diabetic kidney complication: Secondary | ICD-10-CM | POA: Diagnosis not present

## 2016-07-20 DIAGNOSIS — D689 Coagulation defect, unspecified: Secondary | ICD-10-CM | POA: Diagnosis not present

## 2016-07-20 DIAGNOSIS — D631 Anemia in chronic kidney disease: Secondary | ICD-10-CM | POA: Diagnosis not present

## 2016-07-20 DIAGNOSIS — N2581 Secondary hyperparathyroidism of renal origin: Secondary | ICD-10-CM | POA: Diagnosis not present

## 2016-07-20 DIAGNOSIS — N186 End stage renal disease: Secondary | ICD-10-CM | POA: Diagnosis not present

## 2016-07-22 DIAGNOSIS — N186 End stage renal disease: Secondary | ICD-10-CM | POA: Diagnosis not present

## 2016-07-22 DIAGNOSIS — D631 Anemia in chronic kidney disease: Secondary | ICD-10-CM | POA: Diagnosis not present

## 2016-07-22 DIAGNOSIS — N2581 Secondary hyperparathyroidism of renal origin: Secondary | ICD-10-CM | POA: Diagnosis not present

## 2016-07-22 DIAGNOSIS — D689 Coagulation defect, unspecified: Secondary | ICD-10-CM | POA: Diagnosis not present

## 2016-07-22 DIAGNOSIS — E1129 Type 2 diabetes mellitus with other diabetic kidney complication: Secondary | ICD-10-CM | POA: Diagnosis not present

## 2016-07-24 DIAGNOSIS — N186 End stage renal disease: Secondary | ICD-10-CM | POA: Diagnosis not present

## 2016-07-24 DIAGNOSIS — D689 Coagulation defect, unspecified: Secondary | ICD-10-CM | POA: Diagnosis not present

## 2016-07-24 DIAGNOSIS — D631 Anemia in chronic kidney disease: Secondary | ICD-10-CM | POA: Diagnosis not present

## 2016-07-24 DIAGNOSIS — N2581 Secondary hyperparathyroidism of renal origin: Secondary | ICD-10-CM | POA: Diagnosis not present

## 2016-07-24 DIAGNOSIS — E1129 Type 2 diabetes mellitus with other diabetic kidney complication: Secondary | ICD-10-CM | POA: Diagnosis not present

## 2016-07-27 DIAGNOSIS — E1129 Type 2 diabetes mellitus with other diabetic kidney complication: Secondary | ICD-10-CM | POA: Diagnosis not present

## 2016-07-27 DIAGNOSIS — D631 Anemia in chronic kidney disease: Secondary | ICD-10-CM | POA: Diagnosis not present

## 2016-07-27 DIAGNOSIS — N2581 Secondary hyperparathyroidism of renal origin: Secondary | ICD-10-CM | POA: Diagnosis not present

## 2016-07-27 DIAGNOSIS — D689 Coagulation defect, unspecified: Secondary | ICD-10-CM | POA: Diagnosis not present

## 2016-07-27 DIAGNOSIS — N186 End stage renal disease: Secondary | ICD-10-CM | POA: Diagnosis not present

## 2016-07-29 DIAGNOSIS — E1129 Type 2 diabetes mellitus with other diabetic kidney complication: Secondary | ICD-10-CM | POA: Diagnosis not present

## 2016-07-29 DIAGNOSIS — D631 Anemia in chronic kidney disease: Secondary | ICD-10-CM | POA: Diagnosis not present

## 2016-07-29 DIAGNOSIS — N2581 Secondary hyperparathyroidism of renal origin: Secondary | ICD-10-CM | POA: Diagnosis not present

## 2016-07-29 DIAGNOSIS — N186 End stage renal disease: Secondary | ICD-10-CM | POA: Diagnosis not present

## 2016-07-29 DIAGNOSIS — D689 Coagulation defect, unspecified: Secondary | ICD-10-CM | POA: Diagnosis not present

## 2016-07-31 DIAGNOSIS — D631 Anemia in chronic kidney disease: Secondary | ICD-10-CM | POA: Diagnosis not present

## 2016-07-31 DIAGNOSIS — D689 Coagulation defect, unspecified: Secondary | ICD-10-CM | POA: Diagnosis not present

## 2016-07-31 DIAGNOSIS — N186 End stage renal disease: Secondary | ICD-10-CM | POA: Diagnosis not present

## 2016-07-31 DIAGNOSIS — E1129 Type 2 diabetes mellitus with other diabetic kidney complication: Secondary | ICD-10-CM | POA: Diagnosis not present

## 2016-07-31 DIAGNOSIS — N2581 Secondary hyperparathyroidism of renal origin: Secondary | ICD-10-CM | POA: Diagnosis not present

## 2016-08-02 DIAGNOSIS — N2581 Secondary hyperparathyroidism of renal origin: Secondary | ICD-10-CM | POA: Diagnosis not present

## 2016-08-02 DIAGNOSIS — E1129 Type 2 diabetes mellitus with other diabetic kidney complication: Secondary | ICD-10-CM | POA: Diagnosis not present

## 2016-08-02 DIAGNOSIS — N186 End stage renal disease: Secondary | ICD-10-CM | POA: Diagnosis not present

## 2016-08-02 DIAGNOSIS — D631 Anemia in chronic kidney disease: Secondary | ICD-10-CM | POA: Diagnosis not present

## 2016-08-02 DIAGNOSIS — D689 Coagulation defect, unspecified: Secondary | ICD-10-CM | POA: Diagnosis not present

## 2016-08-04 DIAGNOSIS — E1129 Type 2 diabetes mellitus with other diabetic kidney complication: Secondary | ICD-10-CM | POA: Diagnosis not present

## 2016-08-04 DIAGNOSIS — D689 Coagulation defect, unspecified: Secondary | ICD-10-CM | POA: Diagnosis not present

## 2016-08-04 DIAGNOSIS — N186 End stage renal disease: Secondary | ICD-10-CM | POA: Diagnosis not present

## 2016-08-04 DIAGNOSIS — N2581 Secondary hyperparathyroidism of renal origin: Secondary | ICD-10-CM | POA: Diagnosis not present

## 2016-08-04 DIAGNOSIS — D631 Anemia in chronic kidney disease: Secondary | ICD-10-CM | POA: Diagnosis not present

## 2016-08-07 DIAGNOSIS — N2581 Secondary hyperparathyroidism of renal origin: Secondary | ICD-10-CM | POA: Diagnosis not present

## 2016-08-07 DIAGNOSIS — N186 End stage renal disease: Secondary | ICD-10-CM | POA: Diagnosis not present

## 2016-08-07 DIAGNOSIS — E1129 Type 2 diabetes mellitus with other diabetic kidney complication: Secondary | ICD-10-CM | POA: Diagnosis not present

## 2016-08-07 DIAGNOSIS — D631 Anemia in chronic kidney disease: Secondary | ICD-10-CM | POA: Diagnosis not present

## 2016-08-07 DIAGNOSIS — D689 Coagulation defect, unspecified: Secondary | ICD-10-CM | POA: Diagnosis not present

## 2016-08-10 DIAGNOSIS — E1129 Type 2 diabetes mellitus with other diabetic kidney complication: Secondary | ICD-10-CM | POA: Diagnosis not present

## 2016-08-10 DIAGNOSIS — D631 Anemia in chronic kidney disease: Secondary | ICD-10-CM | POA: Diagnosis not present

## 2016-08-10 DIAGNOSIS — D689 Coagulation defect, unspecified: Secondary | ICD-10-CM | POA: Diagnosis not present

## 2016-08-10 DIAGNOSIS — N186 End stage renal disease: Secondary | ICD-10-CM | POA: Diagnosis not present

## 2016-08-10 DIAGNOSIS — N2581 Secondary hyperparathyroidism of renal origin: Secondary | ICD-10-CM | POA: Diagnosis not present

## 2016-08-12 DIAGNOSIS — E1122 Type 2 diabetes mellitus with diabetic chronic kidney disease: Secondary | ICD-10-CM | POA: Diagnosis not present

## 2016-08-12 DIAGNOSIS — Z992 Dependence on renal dialysis: Secondary | ICD-10-CM | POA: Diagnosis not present

## 2016-08-12 DIAGNOSIS — N186 End stage renal disease: Secondary | ICD-10-CM | POA: Diagnosis not present

## 2016-08-14 DIAGNOSIS — N186 End stage renal disease: Secondary | ICD-10-CM | POA: Diagnosis not present

## 2016-08-14 DIAGNOSIS — D689 Coagulation defect, unspecified: Secondary | ICD-10-CM | POA: Diagnosis not present

## 2016-08-14 DIAGNOSIS — N2581 Secondary hyperparathyroidism of renal origin: Secondary | ICD-10-CM | POA: Diagnosis not present

## 2016-08-14 DIAGNOSIS — E1129 Type 2 diabetes mellitus with other diabetic kidney complication: Secondary | ICD-10-CM | POA: Diagnosis not present

## 2016-08-17 DIAGNOSIS — E1129 Type 2 diabetes mellitus with other diabetic kidney complication: Secondary | ICD-10-CM | POA: Diagnosis not present

## 2016-08-17 DIAGNOSIS — D689 Coagulation defect, unspecified: Secondary | ICD-10-CM | POA: Diagnosis not present

## 2016-08-17 DIAGNOSIS — N186 End stage renal disease: Secondary | ICD-10-CM | POA: Diagnosis not present

## 2016-08-17 DIAGNOSIS — N2581 Secondary hyperparathyroidism of renal origin: Secondary | ICD-10-CM | POA: Diagnosis not present

## 2016-08-19 DIAGNOSIS — N2581 Secondary hyperparathyroidism of renal origin: Secondary | ICD-10-CM | POA: Diagnosis not present

## 2016-08-19 DIAGNOSIS — D689 Coagulation defect, unspecified: Secondary | ICD-10-CM | POA: Diagnosis not present

## 2016-08-19 DIAGNOSIS — N186 End stage renal disease: Secondary | ICD-10-CM | POA: Diagnosis not present

## 2016-08-19 DIAGNOSIS — E1129 Type 2 diabetes mellitus with other diabetic kidney complication: Secondary | ICD-10-CM | POA: Diagnosis not present

## 2016-08-21 DIAGNOSIS — N2581 Secondary hyperparathyroidism of renal origin: Secondary | ICD-10-CM | POA: Diagnosis not present

## 2016-08-21 DIAGNOSIS — D689 Coagulation defect, unspecified: Secondary | ICD-10-CM | POA: Diagnosis not present

## 2016-08-21 DIAGNOSIS — N186 End stage renal disease: Secondary | ICD-10-CM | POA: Diagnosis not present

## 2016-08-21 DIAGNOSIS — E1129 Type 2 diabetes mellitus with other diabetic kidney complication: Secondary | ICD-10-CM | POA: Diagnosis not present

## 2016-08-24 DIAGNOSIS — D689 Coagulation defect, unspecified: Secondary | ICD-10-CM | POA: Diagnosis not present

## 2016-08-24 DIAGNOSIS — N186 End stage renal disease: Secondary | ICD-10-CM | POA: Diagnosis not present

## 2016-08-24 DIAGNOSIS — E1129 Type 2 diabetes mellitus with other diabetic kidney complication: Secondary | ICD-10-CM | POA: Diagnosis not present

## 2016-08-24 DIAGNOSIS — N2581 Secondary hyperparathyroidism of renal origin: Secondary | ICD-10-CM | POA: Diagnosis not present

## 2016-08-26 DIAGNOSIS — N2581 Secondary hyperparathyroidism of renal origin: Secondary | ICD-10-CM | POA: Diagnosis not present

## 2016-08-26 DIAGNOSIS — N186 End stage renal disease: Secondary | ICD-10-CM | POA: Diagnosis not present

## 2016-08-26 DIAGNOSIS — E1129 Type 2 diabetes mellitus with other diabetic kidney complication: Secondary | ICD-10-CM | POA: Diagnosis not present

## 2016-08-26 DIAGNOSIS — D689 Coagulation defect, unspecified: Secondary | ICD-10-CM | POA: Diagnosis not present

## 2016-08-28 DIAGNOSIS — D689 Coagulation defect, unspecified: Secondary | ICD-10-CM | POA: Diagnosis not present

## 2016-08-28 DIAGNOSIS — N186 End stage renal disease: Secondary | ICD-10-CM | POA: Diagnosis not present

## 2016-08-28 DIAGNOSIS — N2581 Secondary hyperparathyroidism of renal origin: Secondary | ICD-10-CM | POA: Diagnosis not present

## 2016-08-28 DIAGNOSIS — E1129 Type 2 diabetes mellitus with other diabetic kidney complication: Secondary | ICD-10-CM | POA: Diagnosis not present

## 2016-08-31 DIAGNOSIS — D689 Coagulation defect, unspecified: Secondary | ICD-10-CM | POA: Diagnosis not present

## 2016-08-31 DIAGNOSIS — N2581 Secondary hyperparathyroidism of renal origin: Secondary | ICD-10-CM | POA: Diagnosis not present

## 2016-08-31 DIAGNOSIS — N186 End stage renal disease: Secondary | ICD-10-CM | POA: Diagnosis not present

## 2016-08-31 DIAGNOSIS — E1129 Type 2 diabetes mellitus with other diabetic kidney complication: Secondary | ICD-10-CM | POA: Diagnosis not present

## 2016-09-02 DIAGNOSIS — D689 Coagulation defect, unspecified: Secondary | ICD-10-CM | POA: Diagnosis not present

## 2016-09-02 DIAGNOSIS — N186 End stage renal disease: Secondary | ICD-10-CM | POA: Diagnosis not present

## 2016-09-02 DIAGNOSIS — E1129 Type 2 diabetes mellitus with other diabetic kidney complication: Secondary | ICD-10-CM | POA: Diagnosis not present

## 2016-09-02 DIAGNOSIS — N2581 Secondary hyperparathyroidism of renal origin: Secondary | ICD-10-CM | POA: Diagnosis not present

## 2016-09-04 DIAGNOSIS — N186 End stage renal disease: Secondary | ICD-10-CM | POA: Diagnosis not present

## 2016-09-04 DIAGNOSIS — E1129 Type 2 diabetes mellitus with other diabetic kidney complication: Secondary | ICD-10-CM | POA: Diagnosis not present

## 2016-09-04 DIAGNOSIS — D689 Coagulation defect, unspecified: Secondary | ICD-10-CM | POA: Diagnosis not present

## 2016-09-04 DIAGNOSIS — N2581 Secondary hyperparathyroidism of renal origin: Secondary | ICD-10-CM | POA: Diagnosis not present

## 2016-09-07 DIAGNOSIS — D689 Coagulation defect, unspecified: Secondary | ICD-10-CM | POA: Diagnosis not present

## 2016-09-07 DIAGNOSIS — N186 End stage renal disease: Secondary | ICD-10-CM | POA: Diagnosis not present

## 2016-09-07 DIAGNOSIS — E1129 Type 2 diabetes mellitus with other diabetic kidney complication: Secondary | ICD-10-CM | POA: Diagnosis not present

## 2016-09-07 DIAGNOSIS — N2581 Secondary hyperparathyroidism of renal origin: Secondary | ICD-10-CM | POA: Diagnosis not present

## 2016-09-09 DIAGNOSIS — N2581 Secondary hyperparathyroidism of renal origin: Secondary | ICD-10-CM | POA: Diagnosis not present

## 2016-09-09 DIAGNOSIS — E1129 Type 2 diabetes mellitus with other diabetic kidney complication: Secondary | ICD-10-CM | POA: Diagnosis not present

## 2016-09-09 DIAGNOSIS — D689 Coagulation defect, unspecified: Secondary | ICD-10-CM | POA: Diagnosis not present

## 2016-09-09 DIAGNOSIS — N186 End stage renal disease: Secondary | ICD-10-CM | POA: Diagnosis not present

## 2016-09-11 DIAGNOSIS — N2581 Secondary hyperparathyroidism of renal origin: Secondary | ICD-10-CM | POA: Diagnosis not present

## 2016-09-11 DIAGNOSIS — D689 Coagulation defect, unspecified: Secondary | ICD-10-CM | POA: Diagnosis not present

## 2016-09-11 DIAGNOSIS — N186 End stage renal disease: Secondary | ICD-10-CM | POA: Diagnosis not present

## 2016-09-11 DIAGNOSIS — E1129 Type 2 diabetes mellitus with other diabetic kidney complication: Secondary | ICD-10-CM | POA: Diagnosis not present

## 2016-09-12 DIAGNOSIS — N186 End stage renal disease: Secondary | ICD-10-CM | POA: Diagnosis not present

## 2016-09-12 DIAGNOSIS — E1122 Type 2 diabetes mellitus with diabetic chronic kidney disease: Secondary | ICD-10-CM | POA: Diagnosis not present

## 2016-09-12 DIAGNOSIS — Z992 Dependence on renal dialysis: Secondary | ICD-10-CM | POA: Diagnosis not present

## 2016-09-15 DIAGNOSIS — N186 End stage renal disease: Secondary | ICD-10-CM | POA: Diagnosis not present

## 2016-09-15 DIAGNOSIS — N2581 Secondary hyperparathyroidism of renal origin: Secondary | ICD-10-CM | POA: Diagnosis not present

## 2016-09-15 DIAGNOSIS — D689 Coagulation defect, unspecified: Secondary | ICD-10-CM | POA: Diagnosis not present

## 2016-09-15 DIAGNOSIS — E1129 Type 2 diabetes mellitus with other diabetic kidney complication: Secondary | ICD-10-CM | POA: Diagnosis not present

## 2016-09-16 DIAGNOSIS — N186 End stage renal disease: Secondary | ICD-10-CM | POA: Diagnosis not present

## 2016-09-16 DIAGNOSIS — E1129 Type 2 diabetes mellitus with other diabetic kidney complication: Secondary | ICD-10-CM | POA: Diagnosis not present

## 2016-09-16 DIAGNOSIS — D689 Coagulation defect, unspecified: Secondary | ICD-10-CM | POA: Diagnosis not present

## 2016-09-16 DIAGNOSIS — N2581 Secondary hyperparathyroidism of renal origin: Secondary | ICD-10-CM | POA: Diagnosis not present

## 2016-09-18 DIAGNOSIS — N186 End stage renal disease: Secondary | ICD-10-CM | POA: Diagnosis not present

## 2016-09-18 DIAGNOSIS — D689 Coagulation defect, unspecified: Secondary | ICD-10-CM | POA: Diagnosis not present

## 2016-09-18 DIAGNOSIS — E1129 Type 2 diabetes mellitus with other diabetic kidney complication: Secondary | ICD-10-CM | POA: Diagnosis not present

## 2016-09-18 DIAGNOSIS — N2581 Secondary hyperparathyroidism of renal origin: Secondary | ICD-10-CM | POA: Diagnosis not present

## 2016-09-21 DIAGNOSIS — D689 Coagulation defect, unspecified: Secondary | ICD-10-CM | POA: Diagnosis not present

## 2016-09-21 DIAGNOSIS — N186 End stage renal disease: Secondary | ICD-10-CM | POA: Diagnosis not present

## 2016-09-21 DIAGNOSIS — E1129 Type 2 diabetes mellitus with other diabetic kidney complication: Secondary | ICD-10-CM | POA: Diagnosis not present

## 2016-09-21 DIAGNOSIS — N2581 Secondary hyperparathyroidism of renal origin: Secondary | ICD-10-CM | POA: Diagnosis not present

## 2016-09-23 DIAGNOSIS — D689 Coagulation defect, unspecified: Secondary | ICD-10-CM | POA: Diagnosis not present

## 2016-09-23 DIAGNOSIS — N186 End stage renal disease: Secondary | ICD-10-CM | POA: Diagnosis not present

## 2016-09-23 DIAGNOSIS — E1129 Type 2 diabetes mellitus with other diabetic kidney complication: Secondary | ICD-10-CM | POA: Diagnosis not present

## 2016-09-23 DIAGNOSIS — N2581 Secondary hyperparathyroidism of renal origin: Secondary | ICD-10-CM | POA: Diagnosis not present

## 2016-09-25 DIAGNOSIS — E1129 Type 2 diabetes mellitus with other diabetic kidney complication: Secondary | ICD-10-CM | POA: Diagnosis not present

## 2016-09-25 DIAGNOSIS — N186 End stage renal disease: Secondary | ICD-10-CM | POA: Diagnosis not present

## 2016-09-25 DIAGNOSIS — N2581 Secondary hyperparathyroidism of renal origin: Secondary | ICD-10-CM | POA: Diagnosis not present

## 2016-09-25 DIAGNOSIS — D689 Coagulation defect, unspecified: Secondary | ICD-10-CM | POA: Diagnosis not present

## 2016-09-28 DIAGNOSIS — D689 Coagulation defect, unspecified: Secondary | ICD-10-CM | POA: Diagnosis not present

## 2016-09-28 DIAGNOSIS — N186 End stage renal disease: Secondary | ICD-10-CM | POA: Diagnosis not present

## 2016-09-28 DIAGNOSIS — N2581 Secondary hyperparathyroidism of renal origin: Secondary | ICD-10-CM | POA: Diagnosis not present

## 2016-09-28 DIAGNOSIS — E1129 Type 2 diabetes mellitus with other diabetic kidney complication: Secondary | ICD-10-CM | POA: Diagnosis not present

## 2016-10-05 DIAGNOSIS — N186 End stage renal disease: Secondary | ICD-10-CM | POA: Diagnosis not present

## 2016-10-05 DIAGNOSIS — N2581 Secondary hyperparathyroidism of renal origin: Secondary | ICD-10-CM | POA: Diagnosis not present

## 2016-10-05 DIAGNOSIS — D689 Coagulation defect, unspecified: Secondary | ICD-10-CM | POA: Diagnosis not present

## 2016-10-05 DIAGNOSIS — E1129 Type 2 diabetes mellitus with other diabetic kidney complication: Secondary | ICD-10-CM | POA: Diagnosis not present

## 2016-10-07 DIAGNOSIS — E1129 Type 2 diabetes mellitus with other diabetic kidney complication: Secondary | ICD-10-CM | POA: Diagnosis not present

## 2016-10-07 DIAGNOSIS — N186 End stage renal disease: Secondary | ICD-10-CM | POA: Diagnosis not present

## 2016-10-07 DIAGNOSIS — D689 Coagulation defect, unspecified: Secondary | ICD-10-CM | POA: Diagnosis not present

## 2016-10-07 DIAGNOSIS — N2581 Secondary hyperparathyroidism of renal origin: Secondary | ICD-10-CM | POA: Diagnosis not present

## 2016-10-09 DIAGNOSIS — N2581 Secondary hyperparathyroidism of renal origin: Secondary | ICD-10-CM | POA: Diagnosis not present

## 2016-10-09 DIAGNOSIS — D689 Coagulation defect, unspecified: Secondary | ICD-10-CM | POA: Diagnosis not present

## 2016-10-09 DIAGNOSIS — N186 End stage renal disease: Secondary | ICD-10-CM | POA: Diagnosis not present

## 2016-10-09 DIAGNOSIS — E1129 Type 2 diabetes mellitus with other diabetic kidney complication: Secondary | ICD-10-CM | POA: Diagnosis not present

## 2016-10-13 DIAGNOSIS — E1122 Type 2 diabetes mellitus with diabetic chronic kidney disease: Secondary | ICD-10-CM | POA: Diagnosis not present

## 2016-10-13 DIAGNOSIS — N186 End stage renal disease: Secondary | ICD-10-CM | POA: Diagnosis not present

## 2016-10-13 DIAGNOSIS — Z992 Dependence on renal dialysis: Secondary | ICD-10-CM | POA: Diagnosis not present

## 2016-10-14 DIAGNOSIS — N186 End stage renal disease: Secondary | ICD-10-CM | POA: Diagnosis not present

## 2016-10-14 DIAGNOSIS — D689 Coagulation defect, unspecified: Secondary | ICD-10-CM | POA: Diagnosis not present

## 2016-10-14 DIAGNOSIS — N2581 Secondary hyperparathyroidism of renal origin: Secondary | ICD-10-CM | POA: Diagnosis not present

## 2016-10-14 DIAGNOSIS — E1129 Type 2 diabetes mellitus with other diabetic kidney complication: Secondary | ICD-10-CM | POA: Diagnosis not present

## 2016-10-14 DIAGNOSIS — D631 Anemia in chronic kidney disease: Secondary | ICD-10-CM | POA: Diagnosis not present

## 2016-10-16 DIAGNOSIS — N2581 Secondary hyperparathyroidism of renal origin: Secondary | ICD-10-CM | POA: Diagnosis not present

## 2016-10-16 DIAGNOSIS — D631 Anemia in chronic kidney disease: Secondary | ICD-10-CM | POA: Diagnosis not present

## 2016-10-16 DIAGNOSIS — E1129 Type 2 diabetes mellitus with other diabetic kidney complication: Secondary | ICD-10-CM | POA: Diagnosis not present

## 2016-10-16 DIAGNOSIS — D689 Coagulation defect, unspecified: Secondary | ICD-10-CM | POA: Diagnosis not present

## 2016-10-16 DIAGNOSIS — N186 End stage renal disease: Secondary | ICD-10-CM | POA: Diagnosis not present

## 2016-10-19 DIAGNOSIS — N2581 Secondary hyperparathyroidism of renal origin: Secondary | ICD-10-CM | POA: Diagnosis not present

## 2016-10-19 DIAGNOSIS — N186 End stage renal disease: Secondary | ICD-10-CM | POA: Diagnosis not present

## 2016-10-19 DIAGNOSIS — E1129 Type 2 diabetes mellitus with other diabetic kidney complication: Secondary | ICD-10-CM | POA: Diagnosis not present

## 2016-10-19 DIAGNOSIS — D631 Anemia in chronic kidney disease: Secondary | ICD-10-CM | POA: Diagnosis not present

## 2016-10-19 DIAGNOSIS — D689 Coagulation defect, unspecified: Secondary | ICD-10-CM | POA: Diagnosis not present

## 2016-10-21 DIAGNOSIS — N186 End stage renal disease: Secondary | ICD-10-CM | POA: Diagnosis not present

## 2016-10-21 DIAGNOSIS — N2581 Secondary hyperparathyroidism of renal origin: Secondary | ICD-10-CM | POA: Diagnosis not present

## 2016-10-21 DIAGNOSIS — E1129 Type 2 diabetes mellitus with other diabetic kidney complication: Secondary | ICD-10-CM | POA: Diagnosis not present

## 2016-10-21 DIAGNOSIS — D689 Coagulation defect, unspecified: Secondary | ICD-10-CM | POA: Diagnosis not present

## 2016-10-21 DIAGNOSIS — D631 Anemia in chronic kidney disease: Secondary | ICD-10-CM | POA: Diagnosis not present

## 2016-10-22 DIAGNOSIS — E1129 Type 2 diabetes mellitus with other diabetic kidney complication: Secondary | ICD-10-CM | POA: Diagnosis not present

## 2016-10-22 DIAGNOSIS — D631 Anemia in chronic kidney disease: Secondary | ICD-10-CM | POA: Diagnosis not present

## 2016-10-22 DIAGNOSIS — N2581 Secondary hyperparathyroidism of renal origin: Secondary | ICD-10-CM | POA: Diagnosis not present

## 2016-10-22 DIAGNOSIS — N186 End stage renal disease: Secondary | ICD-10-CM | POA: Diagnosis not present

## 2016-10-22 DIAGNOSIS — D689 Coagulation defect, unspecified: Secondary | ICD-10-CM | POA: Diagnosis not present

## 2016-10-26 DIAGNOSIS — N186 End stage renal disease: Secondary | ICD-10-CM | POA: Diagnosis not present

## 2016-10-26 DIAGNOSIS — E1129 Type 2 diabetes mellitus with other diabetic kidney complication: Secondary | ICD-10-CM | POA: Diagnosis not present

## 2016-10-26 DIAGNOSIS — N2581 Secondary hyperparathyroidism of renal origin: Secondary | ICD-10-CM | POA: Diagnosis not present

## 2016-10-26 DIAGNOSIS — D689 Coagulation defect, unspecified: Secondary | ICD-10-CM | POA: Diagnosis not present

## 2016-10-26 DIAGNOSIS — D631 Anemia in chronic kidney disease: Secondary | ICD-10-CM | POA: Diagnosis not present

## 2016-10-28 DIAGNOSIS — E1129 Type 2 diabetes mellitus with other diabetic kidney complication: Secondary | ICD-10-CM | POA: Diagnosis not present

## 2016-10-28 DIAGNOSIS — N2581 Secondary hyperparathyroidism of renal origin: Secondary | ICD-10-CM | POA: Diagnosis not present

## 2016-10-28 DIAGNOSIS — N186 End stage renal disease: Secondary | ICD-10-CM | POA: Diagnosis not present

## 2016-10-28 DIAGNOSIS — D631 Anemia in chronic kidney disease: Secondary | ICD-10-CM | POA: Diagnosis not present

## 2016-10-28 DIAGNOSIS — D689 Coagulation defect, unspecified: Secondary | ICD-10-CM | POA: Diagnosis not present

## 2016-10-30 DIAGNOSIS — N2581 Secondary hyperparathyroidism of renal origin: Secondary | ICD-10-CM | POA: Diagnosis not present

## 2016-10-30 DIAGNOSIS — E1129 Type 2 diabetes mellitus with other diabetic kidney complication: Secondary | ICD-10-CM | POA: Diagnosis not present

## 2016-10-30 DIAGNOSIS — N186 End stage renal disease: Secondary | ICD-10-CM | POA: Diagnosis not present

## 2016-10-30 DIAGNOSIS — D689 Coagulation defect, unspecified: Secondary | ICD-10-CM | POA: Diagnosis not present

## 2016-10-30 DIAGNOSIS — D631 Anemia in chronic kidney disease: Secondary | ICD-10-CM | POA: Diagnosis not present

## 2016-11-02 DIAGNOSIS — N186 End stage renal disease: Secondary | ICD-10-CM | POA: Diagnosis not present

## 2016-11-02 DIAGNOSIS — D631 Anemia in chronic kidney disease: Secondary | ICD-10-CM | POA: Diagnosis not present

## 2016-11-02 DIAGNOSIS — N2581 Secondary hyperparathyroidism of renal origin: Secondary | ICD-10-CM | POA: Diagnosis not present

## 2016-11-02 DIAGNOSIS — D689 Coagulation defect, unspecified: Secondary | ICD-10-CM | POA: Diagnosis not present

## 2016-11-02 DIAGNOSIS — E1129 Type 2 diabetes mellitus with other diabetic kidney complication: Secondary | ICD-10-CM | POA: Diagnosis not present

## 2016-11-04 DIAGNOSIS — N186 End stage renal disease: Secondary | ICD-10-CM | POA: Diagnosis not present

## 2016-11-04 DIAGNOSIS — D631 Anemia in chronic kidney disease: Secondary | ICD-10-CM | POA: Diagnosis not present

## 2016-11-04 DIAGNOSIS — E1129 Type 2 diabetes mellitus with other diabetic kidney complication: Secondary | ICD-10-CM | POA: Diagnosis not present

## 2016-11-04 DIAGNOSIS — N2581 Secondary hyperparathyroidism of renal origin: Secondary | ICD-10-CM | POA: Diagnosis not present

## 2016-11-04 DIAGNOSIS — D689 Coagulation defect, unspecified: Secondary | ICD-10-CM | POA: Diagnosis not present

## 2016-11-09 DIAGNOSIS — N186 End stage renal disease: Secondary | ICD-10-CM | POA: Diagnosis not present

## 2016-11-09 DIAGNOSIS — D631 Anemia in chronic kidney disease: Secondary | ICD-10-CM | POA: Diagnosis not present

## 2016-11-09 DIAGNOSIS — D689 Coagulation defect, unspecified: Secondary | ICD-10-CM | POA: Diagnosis not present

## 2016-11-09 DIAGNOSIS — E1129 Type 2 diabetes mellitus with other diabetic kidney complication: Secondary | ICD-10-CM | POA: Diagnosis not present

## 2016-11-09 DIAGNOSIS — N2581 Secondary hyperparathyroidism of renal origin: Secondary | ICD-10-CM | POA: Diagnosis not present

## 2016-11-10 DIAGNOSIS — E1122 Type 2 diabetes mellitus with diabetic chronic kidney disease: Secondary | ICD-10-CM | POA: Diagnosis not present

## 2016-11-10 DIAGNOSIS — Z992 Dependence on renal dialysis: Secondary | ICD-10-CM | POA: Diagnosis not present

## 2016-11-10 DIAGNOSIS — E7800 Pure hypercholesterolemia, unspecified: Secondary | ICD-10-CM | POA: Diagnosis not present

## 2016-11-10 DIAGNOSIS — N186 End stage renal disease: Secondary | ICD-10-CM | POA: Diagnosis not present

## 2016-11-10 DIAGNOSIS — E1165 Type 2 diabetes mellitus with hyperglycemia: Secondary | ICD-10-CM | POA: Diagnosis not present

## 2016-11-10 DIAGNOSIS — I10 Essential (primary) hypertension: Secondary | ICD-10-CM | POA: Diagnosis not present

## 2016-11-11 DIAGNOSIS — N186 End stage renal disease: Secondary | ICD-10-CM | POA: Diagnosis not present

## 2016-11-11 DIAGNOSIS — N2581 Secondary hyperparathyroidism of renal origin: Secondary | ICD-10-CM | POA: Diagnosis not present

## 2016-11-11 DIAGNOSIS — D689 Coagulation defect, unspecified: Secondary | ICD-10-CM | POA: Diagnosis not present

## 2016-11-11 DIAGNOSIS — E1129 Type 2 diabetes mellitus with other diabetic kidney complication: Secondary | ICD-10-CM | POA: Diagnosis not present

## 2016-11-13 DIAGNOSIS — N186 End stage renal disease: Secondary | ICD-10-CM | POA: Diagnosis not present

## 2016-11-13 DIAGNOSIS — E1129 Type 2 diabetes mellitus with other diabetic kidney complication: Secondary | ICD-10-CM | POA: Diagnosis not present

## 2016-11-13 DIAGNOSIS — N2581 Secondary hyperparathyroidism of renal origin: Secondary | ICD-10-CM | POA: Diagnosis not present

## 2016-11-13 DIAGNOSIS — D689 Coagulation defect, unspecified: Secondary | ICD-10-CM | POA: Diagnosis not present

## 2016-11-16 DIAGNOSIS — N2581 Secondary hyperparathyroidism of renal origin: Secondary | ICD-10-CM | POA: Diagnosis not present

## 2016-11-16 DIAGNOSIS — D689 Coagulation defect, unspecified: Secondary | ICD-10-CM | POA: Diagnosis not present

## 2016-11-16 DIAGNOSIS — E1129 Type 2 diabetes mellitus with other diabetic kidney complication: Secondary | ICD-10-CM | POA: Diagnosis not present

## 2016-11-16 DIAGNOSIS — N186 End stage renal disease: Secondary | ICD-10-CM | POA: Diagnosis not present

## 2016-11-17 DIAGNOSIS — N186 End stage renal disease: Secondary | ICD-10-CM | POA: Diagnosis not present

## 2016-11-17 DIAGNOSIS — E7800 Pure hypercholesterolemia, unspecified: Secondary | ICD-10-CM | POA: Diagnosis not present

## 2016-11-17 DIAGNOSIS — E1165 Type 2 diabetes mellitus with hyperglycemia: Secondary | ICD-10-CM | POA: Diagnosis not present

## 2016-11-18 DIAGNOSIS — N186 End stage renal disease: Secondary | ICD-10-CM | POA: Diagnosis not present

## 2016-11-18 DIAGNOSIS — E1129 Type 2 diabetes mellitus with other diabetic kidney complication: Secondary | ICD-10-CM | POA: Diagnosis not present

## 2016-11-18 DIAGNOSIS — D689 Coagulation defect, unspecified: Secondary | ICD-10-CM | POA: Diagnosis not present

## 2016-11-18 DIAGNOSIS — N2581 Secondary hyperparathyroidism of renal origin: Secondary | ICD-10-CM | POA: Diagnosis not present

## 2016-11-20 DIAGNOSIS — N186 End stage renal disease: Secondary | ICD-10-CM | POA: Diagnosis not present

## 2016-11-20 DIAGNOSIS — D689 Coagulation defect, unspecified: Secondary | ICD-10-CM | POA: Diagnosis not present

## 2016-11-20 DIAGNOSIS — N2581 Secondary hyperparathyroidism of renal origin: Secondary | ICD-10-CM | POA: Diagnosis not present

## 2016-11-20 DIAGNOSIS — E1129 Type 2 diabetes mellitus with other diabetic kidney complication: Secondary | ICD-10-CM | POA: Diagnosis not present

## 2016-11-27 DIAGNOSIS — N186 End stage renal disease: Secondary | ICD-10-CM | POA: Diagnosis not present

## 2016-11-27 DIAGNOSIS — N2581 Secondary hyperparathyroidism of renal origin: Secondary | ICD-10-CM | POA: Diagnosis not present

## 2016-11-27 DIAGNOSIS — D689 Coagulation defect, unspecified: Secondary | ICD-10-CM | POA: Diagnosis not present

## 2016-11-27 DIAGNOSIS — E1129 Type 2 diabetes mellitus with other diabetic kidney complication: Secondary | ICD-10-CM | POA: Diagnosis not present

## 2016-11-30 DIAGNOSIS — N2581 Secondary hyperparathyroidism of renal origin: Secondary | ICD-10-CM | POA: Diagnosis not present

## 2016-11-30 DIAGNOSIS — D689 Coagulation defect, unspecified: Secondary | ICD-10-CM | POA: Diagnosis not present

## 2016-11-30 DIAGNOSIS — N186 End stage renal disease: Secondary | ICD-10-CM | POA: Diagnosis not present

## 2016-11-30 DIAGNOSIS — E1129 Type 2 diabetes mellitus with other diabetic kidney complication: Secondary | ICD-10-CM | POA: Diagnosis not present

## 2016-12-02 DIAGNOSIS — E1129 Type 2 diabetes mellitus with other diabetic kidney complication: Secondary | ICD-10-CM | POA: Diagnosis not present

## 2016-12-02 DIAGNOSIS — N2581 Secondary hyperparathyroidism of renal origin: Secondary | ICD-10-CM | POA: Diagnosis not present

## 2016-12-02 DIAGNOSIS — D689 Coagulation defect, unspecified: Secondary | ICD-10-CM | POA: Diagnosis not present

## 2016-12-02 DIAGNOSIS — N186 End stage renal disease: Secondary | ICD-10-CM | POA: Diagnosis not present

## 2016-12-04 DIAGNOSIS — N186 End stage renal disease: Secondary | ICD-10-CM | POA: Diagnosis not present

## 2016-12-04 DIAGNOSIS — N2581 Secondary hyperparathyroidism of renal origin: Secondary | ICD-10-CM | POA: Diagnosis not present

## 2016-12-04 DIAGNOSIS — E1129 Type 2 diabetes mellitus with other diabetic kidney complication: Secondary | ICD-10-CM | POA: Diagnosis not present

## 2016-12-04 DIAGNOSIS — D689 Coagulation defect, unspecified: Secondary | ICD-10-CM | POA: Diagnosis not present

## 2016-12-07 DIAGNOSIS — D689 Coagulation defect, unspecified: Secondary | ICD-10-CM | POA: Diagnosis not present

## 2016-12-07 DIAGNOSIS — E1129 Type 2 diabetes mellitus with other diabetic kidney complication: Secondary | ICD-10-CM | POA: Diagnosis not present

## 2016-12-07 DIAGNOSIS — N186 End stage renal disease: Secondary | ICD-10-CM | POA: Diagnosis not present

## 2016-12-07 DIAGNOSIS — N2581 Secondary hyperparathyroidism of renal origin: Secondary | ICD-10-CM | POA: Diagnosis not present

## 2016-12-09 DIAGNOSIS — D689 Coagulation defect, unspecified: Secondary | ICD-10-CM | POA: Diagnosis not present

## 2016-12-09 DIAGNOSIS — E1129 Type 2 diabetes mellitus with other diabetic kidney complication: Secondary | ICD-10-CM | POA: Diagnosis not present

## 2016-12-09 DIAGNOSIS — N2581 Secondary hyperparathyroidism of renal origin: Secondary | ICD-10-CM | POA: Diagnosis not present

## 2016-12-09 DIAGNOSIS — N186 End stage renal disease: Secondary | ICD-10-CM | POA: Diagnosis not present

## 2016-12-11 DIAGNOSIS — Z992 Dependence on renal dialysis: Secondary | ICD-10-CM | POA: Diagnosis not present

## 2016-12-11 DIAGNOSIS — D689 Coagulation defect, unspecified: Secondary | ICD-10-CM | POA: Diagnosis not present

## 2016-12-11 DIAGNOSIS — E1122 Type 2 diabetes mellitus with diabetic chronic kidney disease: Secondary | ICD-10-CM | POA: Diagnosis not present

## 2016-12-11 DIAGNOSIS — N2581 Secondary hyperparathyroidism of renal origin: Secondary | ICD-10-CM | POA: Diagnosis not present

## 2016-12-11 DIAGNOSIS — N186 End stage renal disease: Secondary | ICD-10-CM | POA: Diagnosis not present

## 2016-12-11 DIAGNOSIS — E1129 Type 2 diabetes mellitus with other diabetic kidney complication: Secondary | ICD-10-CM | POA: Diagnosis not present

## 2016-12-14 DIAGNOSIS — Z992 Dependence on renal dialysis: Secondary | ICD-10-CM | POA: Diagnosis not present

## 2016-12-14 DIAGNOSIS — R2231 Localized swelling, mass and lump, right upper limb: Secondary | ICD-10-CM | POA: Diagnosis not present

## 2016-12-14 DIAGNOSIS — I871 Compression of vein: Secondary | ICD-10-CM | POA: Diagnosis not present

## 2016-12-14 DIAGNOSIS — N186 End stage renal disease: Secondary | ICD-10-CM | POA: Diagnosis not present

## 2016-12-17 DIAGNOSIS — E1129 Type 2 diabetes mellitus with other diabetic kidney complication: Secondary | ICD-10-CM | POA: Diagnosis not present

## 2016-12-17 DIAGNOSIS — D689 Coagulation defect, unspecified: Secondary | ICD-10-CM | POA: Diagnosis not present

## 2016-12-17 DIAGNOSIS — D631 Anemia in chronic kidney disease: Secondary | ICD-10-CM | POA: Diagnosis not present

## 2016-12-17 DIAGNOSIS — N2581 Secondary hyperparathyroidism of renal origin: Secondary | ICD-10-CM | POA: Diagnosis not present

## 2016-12-17 DIAGNOSIS — N186 End stage renal disease: Secondary | ICD-10-CM | POA: Diagnosis not present

## 2016-12-21 DIAGNOSIS — D689 Coagulation defect, unspecified: Secondary | ICD-10-CM | POA: Diagnosis not present

## 2016-12-21 DIAGNOSIS — N2581 Secondary hyperparathyroidism of renal origin: Secondary | ICD-10-CM | POA: Diagnosis not present

## 2016-12-21 DIAGNOSIS — D631 Anemia in chronic kidney disease: Secondary | ICD-10-CM | POA: Diagnosis not present

## 2016-12-21 DIAGNOSIS — E1129 Type 2 diabetes mellitus with other diabetic kidney complication: Secondary | ICD-10-CM | POA: Diagnosis not present

## 2016-12-21 DIAGNOSIS — N186 End stage renal disease: Secondary | ICD-10-CM | POA: Diagnosis not present

## 2016-12-23 DIAGNOSIS — D631 Anemia in chronic kidney disease: Secondary | ICD-10-CM | POA: Diagnosis not present

## 2016-12-23 DIAGNOSIS — N186 End stage renal disease: Secondary | ICD-10-CM | POA: Diagnosis not present

## 2016-12-23 DIAGNOSIS — E1129 Type 2 diabetes mellitus with other diabetic kidney complication: Secondary | ICD-10-CM | POA: Diagnosis not present

## 2016-12-23 DIAGNOSIS — D689 Coagulation defect, unspecified: Secondary | ICD-10-CM | POA: Diagnosis not present

## 2016-12-23 DIAGNOSIS — N2581 Secondary hyperparathyroidism of renal origin: Secondary | ICD-10-CM | POA: Diagnosis not present

## 2016-12-25 DIAGNOSIS — N2581 Secondary hyperparathyroidism of renal origin: Secondary | ICD-10-CM | POA: Diagnosis not present

## 2016-12-25 DIAGNOSIS — N186 End stage renal disease: Secondary | ICD-10-CM | POA: Diagnosis not present

## 2016-12-25 DIAGNOSIS — E1129 Type 2 diabetes mellitus with other diabetic kidney complication: Secondary | ICD-10-CM | POA: Diagnosis not present

## 2016-12-25 DIAGNOSIS — D689 Coagulation defect, unspecified: Secondary | ICD-10-CM | POA: Diagnosis not present

## 2016-12-25 DIAGNOSIS — D631 Anemia in chronic kidney disease: Secondary | ICD-10-CM | POA: Diagnosis not present

## 2016-12-28 DIAGNOSIS — N2581 Secondary hyperparathyroidism of renal origin: Secondary | ICD-10-CM | POA: Diagnosis not present

## 2016-12-28 DIAGNOSIS — E1129 Type 2 diabetes mellitus with other diabetic kidney complication: Secondary | ICD-10-CM | POA: Diagnosis not present

## 2016-12-28 DIAGNOSIS — D631 Anemia in chronic kidney disease: Secondary | ICD-10-CM | POA: Diagnosis not present

## 2016-12-28 DIAGNOSIS — N186 End stage renal disease: Secondary | ICD-10-CM | POA: Diagnosis not present

## 2016-12-28 DIAGNOSIS — D689 Coagulation defect, unspecified: Secondary | ICD-10-CM | POA: Diagnosis not present

## 2016-12-30 DIAGNOSIS — D631 Anemia in chronic kidney disease: Secondary | ICD-10-CM | POA: Diagnosis not present

## 2016-12-30 DIAGNOSIS — N186 End stage renal disease: Secondary | ICD-10-CM | POA: Diagnosis not present

## 2016-12-30 DIAGNOSIS — E1129 Type 2 diabetes mellitus with other diabetic kidney complication: Secondary | ICD-10-CM | POA: Diagnosis not present

## 2016-12-30 DIAGNOSIS — D689 Coagulation defect, unspecified: Secondary | ICD-10-CM | POA: Diagnosis not present

## 2016-12-30 DIAGNOSIS — N2581 Secondary hyperparathyroidism of renal origin: Secondary | ICD-10-CM | POA: Diagnosis not present

## 2017-01-01 DIAGNOSIS — N2581 Secondary hyperparathyroidism of renal origin: Secondary | ICD-10-CM | POA: Diagnosis not present

## 2017-01-01 DIAGNOSIS — E1129 Type 2 diabetes mellitus with other diabetic kidney complication: Secondary | ICD-10-CM | POA: Diagnosis not present

## 2017-01-01 DIAGNOSIS — D631 Anemia in chronic kidney disease: Secondary | ICD-10-CM | POA: Diagnosis not present

## 2017-01-01 DIAGNOSIS — D689 Coagulation defect, unspecified: Secondary | ICD-10-CM | POA: Diagnosis not present

## 2017-01-01 DIAGNOSIS — N186 End stage renal disease: Secondary | ICD-10-CM | POA: Diagnosis not present

## 2017-01-04 DIAGNOSIS — D631 Anemia in chronic kidney disease: Secondary | ICD-10-CM | POA: Diagnosis not present

## 2017-01-04 DIAGNOSIS — D689 Coagulation defect, unspecified: Secondary | ICD-10-CM | POA: Diagnosis not present

## 2017-01-04 DIAGNOSIS — E1129 Type 2 diabetes mellitus with other diabetic kidney complication: Secondary | ICD-10-CM | POA: Diagnosis not present

## 2017-01-04 DIAGNOSIS — N2581 Secondary hyperparathyroidism of renal origin: Secondary | ICD-10-CM | POA: Diagnosis not present

## 2017-01-04 DIAGNOSIS — N186 End stage renal disease: Secondary | ICD-10-CM | POA: Diagnosis not present

## 2017-01-06 DIAGNOSIS — D631 Anemia in chronic kidney disease: Secondary | ICD-10-CM | POA: Diagnosis not present

## 2017-01-06 DIAGNOSIS — E1129 Type 2 diabetes mellitus with other diabetic kidney complication: Secondary | ICD-10-CM | POA: Diagnosis not present

## 2017-01-06 DIAGNOSIS — N2581 Secondary hyperparathyroidism of renal origin: Secondary | ICD-10-CM | POA: Diagnosis not present

## 2017-01-06 DIAGNOSIS — N186 End stage renal disease: Secondary | ICD-10-CM | POA: Diagnosis not present

## 2017-01-06 DIAGNOSIS — D689 Coagulation defect, unspecified: Secondary | ICD-10-CM | POA: Diagnosis not present

## 2017-01-08 DIAGNOSIS — D631 Anemia in chronic kidney disease: Secondary | ICD-10-CM | POA: Diagnosis not present

## 2017-01-08 DIAGNOSIS — N186 End stage renal disease: Secondary | ICD-10-CM | POA: Diagnosis not present

## 2017-01-08 DIAGNOSIS — N2581 Secondary hyperparathyroidism of renal origin: Secondary | ICD-10-CM | POA: Diagnosis not present

## 2017-01-08 DIAGNOSIS — E1129 Type 2 diabetes mellitus with other diabetic kidney complication: Secondary | ICD-10-CM | POA: Diagnosis not present

## 2017-01-08 DIAGNOSIS — D689 Coagulation defect, unspecified: Secondary | ICD-10-CM | POA: Diagnosis not present

## 2017-01-10 DIAGNOSIS — N186 End stage renal disease: Secondary | ICD-10-CM | POA: Diagnosis not present

## 2017-01-10 DIAGNOSIS — E1129 Type 2 diabetes mellitus with other diabetic kidney complication: Secondary | ICD-10-CM | POA: Diagnosis not present

## 2017-01-10 DIAGNOSIS — N2581 Secondary hyperparathyroidism of renal origin: Secondary | ICD-10-CM | POA: Diagnosis not present

## 2017-01-10 DIAGNOSIS — E1122 Type 2 diabetes mellitus with diabetic chronic kidney disease: Secondary | ICD-10-CM | POA: Diagnosis not present

## 2017-01-10 DIAGNOSIS — D631 Anemia in chronic kidney disease: Secondary | ICD-10-CM | POA: Diagnosis not present

## 2017-01-10 DIAGNOSIS — Z992 Dependence on renal dialysis: Secondary | ICD-10-CM | POA: Diagnosis not present

## 2017-01-10 DIAGNOSIS — D689 Coagulation defect, unspecified: Secondary | ICD-10-CM | POA: Diagnosis not present

## 2017-01-11 DIAGNOSIS — D689 Coagulation defect, unspecified: Secondary | ICD-10-CM | POA: Diagnosis not present

## 2017-01-11 DIAGNOSIS — E1129 Type 2 diabetes mellitus with other diabetic kidney complication: Secondary | ICD-10-CM | POA: Diagnosis not present

## 2017-01-11 DIAGNOSIS — D631 Anemia in chronic kidney disease: Secondary | ICD-10-CM | POA: Diagnosis not present

## 2017-01-11 DIAGNOSIS — N2581 Secondary hyperparathyroidism of renal origin: Secondary | ICD-10-CM | POA: Diagnosis not present

## 2017-01-11 DIAGNOSIS — N186 End stage renal disease: Secondary | ICD-10-CM | POA: Diagnosis not present

## 2017-01-17 DIAGNOSIS — D631 Anemia in chronic kidney disease: Secondary | ICD-10-CM | POA: Diagnosis not present

## 2017-01-17 DIAGNOSIS — E1129 Type 2 diabetes mellitus with other diabetic kidney complication: Secondary | ICD-10-CM | POA: Diagnosis not present

## 2017-01-17 DIAGNOSIS — N186 End stage renal disease: Secondary | ICD-10-CM | POA: Diagnosis not present

## 2017-01-17 DIAGNOSIS — N2581 Secondary hyperparathyroidism of renal origin: Secondary | ICD-10-CM | POA: Diagnosis not present

## 2017-01-17 DIAGNOSIS — D689 Coagulation defect, unspecified: Secondary | ICD-10-CM | POA: Diagnosis not present

## 2017-01-18 DIAGNOSIS — N2581 Secondary hyperparathyroidism of renal origin: Secondary | ICD-10-CM | POA: Diagnosis not present

## 2017-01-18 DIAGNOSIS — E1129 Type 2 diabetes mellitus with other diabetic kidney complication: Secondary | ICD-10-CM | POA: Diagnosis not present

## 2017-01-18 DIAGNOSIS — N186 End stage renal disease: Secondary | ICD-10-CM | POA: Diagnosis not present

## 2017-01-18 DIAGNOSIS — D689 Coagulation defect, unspecified: Secondary | ICD-10-CM | POA: Diagnosis not present

## 2017-01-18 DIAGNOSIS — D631 Anemia in chronic kidney disease: Secondary | ICD-10-CM | POA: Diagnosis not present

## 2017-01-20 DIAGNOSIS — E1129 Type 2 diabetes mellitus with other diabetic kidney complication: Secondary | ICD-10-CM | POA: Diagnosis not present

## 2017-01-20 DIAGNOSIS — N186 End stage renal disease: Secondary | ICD-10-CM | POA: Diagnosis not present

## 2017-01-20 DIAGNOSIS — D631 Anemia in chronic kidney disease: Secondary | ICD-10-CM | POA: Diagnosis not present

## 2017-01-20 DIAGNOSIS — N2581 Secondary hyperparathyroidism of renal origin: Secondary | ICD-10-CM | POA: Diagnosis not present

## 2017-01-20 DIAGNOSIS — D689 Coagulation defect, unspecified: Secondary | ICD-10-CM | POA: Diagnosis not present

## 2017-01-22 DIAGNOSIS — N2581 Secondary hyperparathyroidism of renal origin: Secondary | ICD-10-CM | POA: Diagnosis not present

## 2017-01-22 DIAGNOSIS — E1129 Type 2 diabetes mellitus with other diabetic kidney complication: Secondary | ICD-10-CM | POA: Diagnosis not present

## 2017-01-22 DIAGNOSIS — D689 Coagulation defect, unspecified: Secondary | ICD-10-CM | POA: Diagnosis not present

## 2017-01-22 DIAGNOSIS — D631 Anemia in chronic kidney disease: Secondary | ICD-10-CM | POA: Diagnosis not present

## 2017-01-22 DIAGNOSIS — N186 End stage renal disease: Secondary | ICD-10-CM | POA: Diagnosis not present

## 2017-01-25 DIAGNOSIS — D631 Anemia in chronic kidney disease: Secondary | ICD-10-CM | POA: Diagnosis not present

## 2017-01-25 DIAGNOSIS — N186 End stage renal disease: Secondary | ICD-10-CM | POA: Diagnosis not present

## 2017-01-25 DIAGNOSIS — D689 Coagulation defect, unspecified: Secondary | ICD-10-CM | POA: Diagnosis not present

## 2017-01-25 DIAGNOSIS — E1129 Type 2 diabetes mellitus with other diabetic kidney complication: Secondary | ICD-10-CM | POA: Diagnosis not present

## 2017-01-25 DIAGNOSIS — N2581 Secondary hyperparathyroidism of renal origin: Secondary | ICD-10-CM | POA: Diagnosis not present

## 2017-01-27 DIAGNOSIS — D631 Anemia in chronic kidney disease: Secondary | ICD-10-CM | POA: Diagnosis not present

## 2017-01-27 DIAGNOSIS — E1129 Type 2 diabetes mellitus with other diabetic kidney complication: Secondary | ICD-10-CM | POA: Diagnosis not present

## 2017-01-27 DIAGNOSIS — D689 Coagulation defect, unspecified: Secondary | ICD-10-CM | POA: Diagnosis not present

## 2017-01-27 DIAGNOSIS — N2581 Secondary hyperparathyroidism of renal origin: Secondary | ICD-10-CM | POA: Diagnosis not present

## 2017-01-27 DIAGNOSIS — N186 End stage renal disease: Secondary | ICD-10-CM | POA: Diagnosis not present

## 2017-01-29 DIAGNOSIS — E1129 Type 2 diabetes mellitus with other diabetic kidney complication: Secondary | ICD-10-CM | POA: Diagnosis not present

## 2017-01-29 DIAGNOSIS — D689 Coagulation defect, unspecified: Secondary | ICD-10-CM | POA: Diagnosis not present

## 2017-01-29 DIAGNOSIS — N186 End stage renal disease: Secondary | ICD-10-CM | POA: Diagnosis not present

## 2017-01-29 DIAGNOSIS — N2581 Secondary hyperparathyroidism of renal origin: Secondary | ICD-10-CM | POA: Diagnosis not present

## 2017-01-29 DIAGNOSIS — D631 Anemia in chronic kidney disease: Secondary | ICD-10-CM | POA: Diagnosis not present

## 2017-02-01 DIAGNOSIS — N186 End stage renal disease: Secondary | ICD-10-CM | POA: Diagnosis not present

## 2017-02-01 DIAGNOSIS — D689 Coagulation defect, unspecified: Secondary | ICD-10-CM | POA: Diagnosis not present

## 2017-02-01 DIAGNOSIS — E1129 Type 2 diabetes mellitus with other diabetic kidney complication: Secondary | ICD-10-CM | POA: Diagnosis not present

## 2017-02-01 DIAGNOSIS — N2581 Secondary hyperparathyroidism of renal origin: Secondary | ICD-10-CM | POA: Diagnosis not present

## 2017-02-01 DIAGNOSIS — D631 Anemia in chronic kidney disease: Secondary | ICD-10-CM | POA: Diagnosis not present

## 2017-02-03 DIAGNOSIS — D631 Anemia in chronic kidney disease: Secondary | ICD-10-CM | POA: Diagnosis not present

## 2017-02-03 DIAGNOSIS — D689 Coagulation defect, unspecified: Secondary | ICD-10-CM | POA: Diagnosis not present

## 2017-02-03 DIAGNOSIS — N2581 Secondary hyperparathyroidism of renal origin: Secondary | ICD-10-CM | POA: Diagnosis not present

## 2017-02-03 DIAGNOSIS — N186 End stage renal disease: Secondary | ICD-10-CM | POA: Diagnosis not present

## 2017-02-03 DIAGNOSIS — E1129 Type 2 diabetes mellitus with other diabetic kidney complication: Secondary | ICD-10-CM | POA: Diagnosis not present

## 2017-02-05 DIAGNOSIS — N186 End stage renal disease: Secondary | ICD-10-CM | POA: Diagnosis not present

## 2017-02-05 DIAGNOSIS — D689 Coagulation defect, unspecified: Secondary | ICD-10-CM | POA: Diagnosis not present

## 2017-02-05 DIAGNOSIS — E1129 Type 2 diabetes mellitus with other diabetic kidney complication: Secondary | ICD-10-CM | POA: Diagnosis not present

## 2017-02-05 DIAGNOSIS — N2581 Secondary hyperparathyroidism of renal origin: Secondary | ICD-10-CM | POA: Diagnosis not present

## 2017-02-05 DIAGNOSIS — D631 Anemia in chronic kidney disease: Secondary | ICD-10-CM | POA: Diagnosis not present

## 2017-02-07 DIAGNOSIS — N2581 Secondary hyperparathyroidism of renal origin: Secondary | ICD-10-CM | POA: Diagnosis not present

## 2017-02-07 DIAGNOSIS — E1129 Type 2 diabetes mellitus with other diabetic kidney complication: Secondary | ICD-10-CM | POA: Diagnosis not present

## 2017-02-07 DIAGNOSIS — D631 Anemia in chronic kidney disease: Secondary | ICD-10-CM | POA: Diagnosis not present

## 2017-02-07 DIAGNOSIS — D689 Coagulation defect, unspecified: Secondary | ICD-10-CM | POA: Diagnosis not present

## 2017-02-07 DIAGNOSIS — N186 End stage renal disease: Secondary | ICD-10-CM | POA: Diagnosis not present

## 2017-02-08 DIAGNOSIS — N186 End stage renal disease: Secondary | ICD-10-CM | POA: Diagnosis not present

## 2017-02-08 DIAGNOSIS — D631 Anemia in chronic kidney disease: Secondary | ICD-10-CM | POA: Diagnosis not present

## 2017-02-08 DIAGNOSIS — E1129 Type 2 diabetes mellitus with other diabetic kidney complication: Secondary | ICD-10-CM | POA: Diagnosis not present

## 2017-02-08 DIAGNOSIS — D689 Coagulation defect, unspecified: Secondary | ICD-10-CM | POA: Diagnosis not present

## 2017-02-08 DIAGNOSIS — N2581 Secondary hyperparathyroidism of renal origin: Secondary | ICD-10-CM | POA: Diagnosis not present

## 2017-02-10 DIAGNOSIS — E1129 Type 2 diabetes mellitus with other diabetic kidney complication: Secondary | ICD-10-CM | POA: Diagnosis not present

## 2017-02-10 DIAGNOSIS — N2581 Secondary hyperparathyroidism of renal origin: Secondary | ICD-10-CM | POA: Diagnosis not present

## 2017-02-10 DIAGNOSIS — D631 Anemia in chronic kidney disease: Secondary | ICD-10-CM | POA: Diagnosis not present

## 2017-02-10 DIAGNOSIS — D689 Coagulation defect, unspecified: Secondary | ICD-10-CM | POA: Diagnosis not present

## 2017-02-10 DIAGNOSIS — Z992 Dependence on renal dialysis: Secondary | ICD-10-CM | POA: Diagnosis not present

## 2017-02-10 DIAGNOSIS — N186 End stage renal disease: Secondary | ICD-10-CM | POA: Diagnosis not present

## 2017-02-10 DIAGNOSIS — E1122 Type 2 diabetes mellitus with diabetic chronic kidney disease: Secondary | ICD-10-CM | POA: Diagnosis not present

## 2017-02-12 DIAGNOSIS — N186 End stage renal disease: Secondary | ICD-10-CM | POA: Diagnosis not present

## 2017-02-12 DIAGNOSIS — D631 Anemia in chronic kidney disease: Secondary | ICD-10-CM | POA: Diagnosis not present

## 2017-02-12 DIAGNOSIS — D689 Coagulation defect, unspecified: Secondary | ICD-10-CM | POA: Diagnosis not present

## 2017-02-12 DIAGNOSIS — E1129 Type 2 diabetes mellitus with other diabetic kidney complication: Secondary | ICD-10-CM | POA: Diagnosis not present

## 2017-02-12 DIAGNOSIS — N2581 Secondary hyperparathyroidism of renal origin: Secondary | ICD-10-CM | POA: Diagnosis not present

## 2017-02-15 DIAGNOSIS — N186 End stage renal disease: Secondary | ICD-10-CM | POA: Diagnosis not present

## 2017-02-15 DIAGNOSIS — N2581 Secondary hyperparathyroidism of renal origin: Secondary | ICD-10-CM | POA: Diagnosis not present

## 2017-02-15 DIAGNOSIS — D689 Coagulation defect, unspecified: Secondary | ICD-10-CM | POA: Diagnosis not present

## 2017-02-15 DIAGNOSIS — E1129 Type 2 diabetes mellitus with other diabetic kidney complication: Secondary | ICD-10-CM | POA: Diagnosis not present

## 2017-02-15 DIAGNOSIS — D631 Anemia in chronic kidney disease: Secondary | ICD-10-CM | POA: Diagnosis not present

## 2017-02-17 DIAGNOSIS — E1129 Type 2 diabetes mellitus with other diabetic kidney complication: Secondary | ICD-10-CM | POA: Diagnosis not present

## 2017-02-17 DIAGNOSIS — N186 End stage renal disease: Secondary | ICD-10-CM | POA: Diagnosis not present

## 2017-02-17 DIAGNOSIS — D689 Coagulation defect, unspecified: Secondary | ICD-10-CM | POA: Diagnosis not present

## 2017-02-17 DIAGNOSIS — D631 Anemia in chronic kidney disease: Secondary | ICD-10-CM | POA: Diagnosis not present

## 2017-02-17 DIAGNOSIS — N2581 Secondary hyperparathyroidism of renal origin: Secondary | ICD-10-CM | POA: Diagnosis not present

## 2017-02-19 DIAGNOSIS — N2581 Secondary hyperparathyroidism of renal origin: Secondary | ICD-10-CM | POA: Diagnosis not present

## 2017-02-19 DIAGNOSIS — N186 End stage renal disease: Secondary | ICD-10-CM | POA: Diagnosis not present

## 2017-02-19 DIAGNOSIS — D689 Coagulation defect, unspecified: Secondary | ICD-10-CM | POA: Diagnosis not present

## 2017-02-19 DIAGNOSIS — D631 Anemia in chronic kidney disease: Secondary | ICD-10-CM | POA: Diagnosis not present

## 2017-02-19 DIAGNOSIS — E1129 Type 2 diabetes mellitus with other diabetic kidney complication: Secondary | ICD-10-CM | POA: Diagnosis not present

## 2017-02-22 DIAGNOSIS — D689 Coagulation defect, unspecified: Secondary | ICD-10-CM | POA: Diagnosis not present

## 2017-02-22 DIAGNOSIS — D631 Anemia in chronic kidney disease: Secondary | ICD-10-CM | POA: Diagnosis not present

## 2017-02-22 DIAGNOSIS — E1129 Type 2 diabetes mellitus with other diabetic kidney complication: Secondary | ICD-10-CM | POA: Diagnosis not present

## 2017-02-22 DIAGNOSIS — N2581 Secondary hyperparathyroidism of renal origin: Secondary | ICD-10-CM | POA: Diagnosis not present

## 2017-02-22 DIAGNOSIS — N186 End stage renal disease: Secondary | ICD-10-CM | POA: Diagnosis not present

## 2017-02-24 DIAGNOSIS — D631 Anemia in chronic kidney disease: Secondary | ICD-10-CM | POA: Diagnosis not present

## 2017-02-24 DIAGNOSIS — E1129 Type 2 diabetes mellitus with other diabetic kidney complication: Secondary | ICD-10-CM | POA: Diagnosis not present

## 2017-02-24 DIAGNOSIS — D689 Coagulation defect, unspecified: Secondary | ICD-10-CM | POA: Diagnosis not present

## 2017-02-24 DIAGNOSIS — N2581 Secondary hyperparathyroidism of renal origin: Secondary | ICD-10-CM | POA: Diagnosis not present

## 2017-02-24 DIAGNOSIS — N186 End stage renal disease: Secondary | ICD-10-CM | POA: Diagnosis not present

## 2017-02-26 DIAGNOSIS — N2581 Secondary hyperparathyroidism of renal origin: Secondary | ICD-10-CM | POA: Diagnosis not present

## 2017-02-26 DIAGNOSIS — N186 End stage renal disease: Secondary | ICD-10-CM | POA: Diagnosis not present

## 2017-02-26 DIAGNOSIS — D689 Coagulation defect, unspecified: Secondary | ICD-10-CM | POA: Diagnosis not present

## 2017-02-26 DIAGNOSIS — E1129 Type 2 diabetes mellitus with other diabetic kidney complication: Secondary | ICD-10-CM | POA: Diagnosis not present

## 2017-02-26 DIAGNOSIS — D631 Anemia in chronic kidney disease: Secondary | ICD-10-CM | POA: Diagnosis not present

## 2017-03-02 ENCOUNTER — Encounter (HOSPITAL_COMMUNITY): Payer: Self-pay | Admitting: *Deleted

## 2017-03-02 ENCOUNTER — Emergency Department (HOSPITAL_COMMUNITY): Payer: Medicare Other

## 2017-03-02 ENCOUNTER — Emergency Department (HOSPITAL_COMMUNITY)
Admission: EM | Admit: 2017-03-02 | Discharge: 2017-03-02 | Disposition: A | Discharge status: Home / Self Care | Payer: Medicare Other | Attending: Emergency Medicine | Admitting: Emergency Medicine

## 2017-03-02 DIAGNOSIS — Z992 Dependence on renal dialysis: Secondary | ICD-10-CM | POA: Insufficient documentation

## 2017-03-02 DIAGNOSIS — R0602 Shortness of breath: Secondary | ICD-10-CM | POA: Diagnosis not present

## 2017-03-02 DIAGNOSIS — J810 Acute pulmonary edema: Secondary | ICD-10-CM | POA: Diagnosis not present

## 2017-03-02 DIAGNOSIS — I120 Hypertensive chronic kidney disease with stage 5 chronic kidney disease or end stage renal disease: Secondary | ICD-10-CM | POA: Diagnosis not present

## 2017-03-02 DIAGNOSIS — Z7982 Long term (current) use of aspirin: Secondary | ICD-10-CM | POA: Insufficient documentation

## 2017-03-02 DIAGNOSIS — N185 Chronic kidney disease, stage 5: Secondary | ICD-10-CM | POA: Diagnosis not present

## 2017-03-02 DIAGNOSIS — Z794 Long term (current) use of insulin: Secondary | ICD-10-CM | POA: Insufficient documentation

## 2017-03-02 DIAGNOSIS — Z87891 Personal history of nicotine dependence: Secondary | ICD-10-CM | POA: Diagnosis not present

## 2017-03-02 DIAGNOSIS — R0682 Tachypnea, not elsewhere classified: Secondary | ICD-10-CM | POA: Diagnosis not present

## 2017-03-02 DIAGNOSIS — E1122 Type 2 diabetes mellitus with diabetic chronic kidney disease: Secondary | ICD-10-CM | POA: Diagnosis not present

## 2017-03-02 DIAGNOSIS — Z79899 Other long term (current) drug therapy: Secondary | ICD-10-CM | POA: Insufficient documentation

## 2017-03-02 LAB — I-STAT CHEM 8, ED
BUN: 69 mg/dL — AB (ref 6–20)
CREATININE: 12.1 mg/dL — AB (ref 0.61–1.24)
Calcium, Ion: 0.88 mmol/L — CL (ref 1.15–1.40)
Chloride: 104 mmol/L (ref 101–111)
GLUCOSE: 140 mg/dL — AB (ref 65–99)
HEMATOCRIT: 34 % — AB (ref 39.0–52.0)
Hemoglobin: 11.6 g/dL — ABNORMAL LOW (ref 13.0–17.0)
POTASSIUM: 4.9 mmol/L (ref 3.5–5.1)
Sodium: 141 mmol/L (ref 135–145)
TCO2: 22 mmol/L (ref 0–100)

## 2017-03-02 LAB — BASIC METABOLIC PANEL
Anion gap: 16 — ABNORMAL HIGH (ref 5–15)
BUN: 78 mg/dL — AB (ref 6–20)
CHLORIDE: 103 mmol/L (ref 101–111)
CO2: 20 mmol/L — AB (ref 22–32)
CREATININE: 11.68 mg/dL — AB (ref 0.61–1.24)
Calcium: 7.4 mg/dL — ABNORMAL LOW (ref 8.9–10.3)
GFR calc Af Amer: 4 mL/min — ABNORMAL LOW (ref >60)
GFR calc non Af Amer: 4 mL/min — ABNORMAL LOW (ref >60)
Glucose, Bld: 141 mg/dL — ABNORMAL HIGH (ref 65–99)
POTASSIUM: 5 mmol/L (ref 3.5–5.1)
Sodium: 139 mmol/L (ref 135–145)

## 2017-03-02 LAB — CBG MONITORING, ED: Glucose-Capillary: 124 mg/dL — ABNORMAL HIGH (ref 65–99)

## 2017-03-02 LAB — CBC
HEMATOCRIT: 34.2 % — AB (ref 39.0–52.0)
Hemoglobin: 11 g/dL — ABNORMAL LOW (ref 13.0–17.0)
MCH: 28.1 pg (ref 26.0–34.0)
MCHC: 32.2 g/dL (ref 30.0–36.0)
MCV: 87.5 fL (ref 78.0–100.0)
PLATELETS: 213 10*3/uL (ref 150–400)
RBC: 3.91 MIL/uL — AB (ref 4.22–5.81)
RDW: 14.7 % (ref 11.5–15.5)
WBC: 8.2 10*3/uL (ref 4.0–10.5)

## 2017-03-02 NOTE — ED Notes (Signed)
MD at bedside, reading portable chest x-ray.

## 2017-03-02 NOTE — ED Triage Notes (Signed)
Pt here from home via [facility] for sob.  Dialysis [facility], but missed Tues d/t feeling very week after taking shower.  Pt on NRB when EMS arrived and changed to 4L [place] with sats of 97%.  Sats of 70 on 4L when pt arrived in ED and placed back on nrb, which increased sats to 100%. Other vs per [facility] 147/70, rr30. Denies chest pain, dizziness only when bending over. C/o sob with exertion for several weeks that increased when he missed this last dialysis.

## 2017-03-02 NOTE — ED Provider Notes (Signed)
[facility] DEPT Provider Note   CSN: [id number] Arrival date & time: 03/02/17  1240     History   Chief Complaint Chief Complaint  Patient presents with  . Shortness of Breath    HPI Blake [name] is a 71 y.o. male.  He presents for evaluation of shortness of breath.  EMS found him to be hypoxic, placed him on FACE mask oxygen with improvement to greater than 90%.  He missed dialysis, yesterday because he was feeling weak.  He denies cough, fever, chills, nausea, vomiting, headache or back pain.  There are no other known modifying factors.  HPI  Past Medical History:  Diagnosis Date  . Acute osteomyelitis of toe of right foot ([place]) 08/02/2015  . Diabetes mellitus without complication ([place])   . Hyperlipidemia   . Hypertension   . Renal disorder     Patient Active Problem List   Diagnosis Date Noted  . Nausea and vomiting 04/21/2016  . Noncompliance with renal dialysis ([place]) 04/21/2016  . Hyperkalemia, diminished renal excretion 04/21/2016  . Nausea & vomiting 04/21/2016  . Encephalopathy, metabolic 62/95/2841  . Acute osteomyelitis of toe of right foot ([place]) 08/02/2015  . Ulcer of toe of right foot ([place]) 08/01/2015  . Hypothermia 08/01/2015  . Fall 08/01/2015  . Hypoglycemia associated with diabetes ([place]) 08/01/2015  . Occasional numbness/prickling/tingling of fingers  08/23/2014  . 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 Acute pulmonary edema ([name])   . ESRD on dialysis ([place]) 07/07/2014  . Depression 07/07/2014  . Neuropathic pain, leg, bilateral 07/07/2014  . Diabetic retinopathy ([place]) 07/07/2014  . Advance directive discussed with patient 07/07/2014  . Acute respiratory failure ([place]) 06/28/2014  . Scrotal pain 10/19/2012  . 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 Proteinuria 10/17/2012  . Hypoxia 10/17/2012  . Hypertension     Past Surgical History:  Procedure Laterality Date  . AMPUTATION TOE Right 08/04/2015   Procedure: RIGHT GREAT TOE AMPUTATION;  Surgeon: [name], MD;  Location: [facility];  Service: Orthopedics;  Laterality: Right;  . AV FISTULA PLACEMENT Right 07/05/2014   Procedure: ARTERIOVENOUS BRACHIALCEPHALIC (AV) FISTULA CREATION;  Surgeon: Conrad [place], MD;  Location: [facility];  Service: Vascular;  Laterality: Right;  . BACK SURGERY    . ESOPHAGOGASTRODUODENOSCOPY (EGD) WITH PROPOFOL N/A 11/28/2015   Procedure: ESOPHAGOGASTRODUODENOSCOPY (EGD) WITH PROPOFOL;  Surgeon: [name], MD;  Location: [facility];  Service: Endoscopy;  Laterality: N/A;  . INSERTION OF DIALYSIS CATHETER Left 07/05/2014   Procedure: INSERTION OF DIALYSIS CATHETER-LEFT INTERNAL JUGULAR PLACEMENT;  Surgeon: Conrad Ten Broeck, MD;  Location: [facility];  Service: Vascular;  Laterality: Left;  . REMOVAL OF A DIALYSIS CATHETER Right 07/05/2014   Procedure: REMOVAL OF A DIALYSIS CATHETER;  Surgeon: Conrad [place], MD;  Location: [facility];  Service: Vascular;  Laterality: Right;       Home Medications    Prior to Admission medications   Medication Sig Start Date End Date Taking? Authorizing Provider  aspirin EC 81 MG tablet Take 81 mg by mouth daily.    [provider]  atorvastatin (LIPITOR) 20 MG tablet Take 20 mg by mouth daily.    [provider]  bisacodyl (DULCOLAX) 10 MG suppository Place 1 suppository (10 mg total) rectally daily as needed for moderate constipation. 04/22/16   Mikhail, Velta Addison, DO  buPROPion (WELLBUTRIN XL) 150 MG 24 hr tablet Take 300 mg by mouth daily.    [provider]  DULoxetine (CYMBALTA) 20 MG capsule Take 1 capsule (20 mg total) by mouth daily. Patient taking differently: Take 20 mg by mouth daily after supper.  07/09/14   Elgergawy, Silver Huguenin, MD  multivitamin (RENA-VIT) TABS tablet Take 1 tablet by mouth daily.    [provider]  NOVOLIN R RELION 100 UNIT/ML injection Inject 3-13 Units into the skin 3 (three) times daily with meals as needed for high blood  sugar.  06/13/14   [provider]  pantoprazole (PROTONIX) 20 MG tablet Take 1 tablet (20 mg total) by mouth daily. 10/25/15   Patel-Mills, Hanna, PA-C  SENSIPAR 30 MG tablet Take 1 tablet by mouth daily. 07/23/15   [provider]  sevelamer carbonate (RENVELA) 800 MG tablet Take 1 tablet (800 mg total) by mouth 3 (three) times daily with meals. 08/23/14   Conrad [place], MD    Family History Family History  Problem Relation Age of Onset  . Diabetes Father   . Heart attack Father     Social History Social History  Substance Use Topics  . Smoking status: Former Smoker    Years: 5.00    Types: Cigarettes    Quit date: 08/24/1979  . Smokeless tobacco: Never Used  . Alcohol use No     Allergies   Patient has no known allergies.   Review of Systems Review of Systems  All other systems reviewed and are negative.    Physical Exam Updated Vital Signs BP 135/65   Pulse 76   Temp 97.5 F (36.4 C) (Oral)   Resp 16   Ht 5\' 8"  (1.727 m)   Wt 85.3 kg (188 lb)   SpO2 100%   BMI 28.59 kg/m   Physical Exam  Constitutional: He is oriented to person, place, and time. He appears well-developed. No distress.  Overweight  HENT:  Head: Normocephalic and atraumatic.  Right Ear: External ear normal.  Left Ear: External ear normal.  Eyes: Conjunctivae and EOM are normal. Pupils are equal, round, and reactive to light.  Neck: Normal range of motion and phonation normal. Neck supple.  Cardiovascular: Normal rate, regular rhythm and normal heart sounds.   Pulmonary/Chest: He is in respiratory distress (Tachypnea). He exhibits no bony tenderness.  Abdominal: Soft. There is no tenderness.  Musculoskeletal: Normal range of motion.  Neurological: He is alert and oriented to person, place, and time. No cranial nerve deficit or sensory deficit. He exhibits normal muscle tone. Coordination normal.  Skin: Skin is warm, dry and intact.  Psychiatric: He has a normal mood and  affect. His behavior is normal.  Nursing note and vitals reviewed.    ED Treatments / Results  Labs (all labs ordered are listed, but only abnormal results are displayed) Labs Reviewed  BASIC METABOLIC PANEL - Abnormal; Notable for the following:       Result Value   CO2 20 (*)    Glucose, Bld 141 (*)    BUN 78 (*)    Creatinine, Ser 11.68 (*)    Calcium 7.4 (*)    GFR calc non Af Amer 4 (*)    GFR calc Af Amer 4 (*)    Anion gap 16 (*)    All other components within normal limits  CBC - Abnormal; Notable for the following:    RBC 3.91 (*)    Hemoglobin 11.0 (*)    HCT 34.2 (*)    All other components within normal limits  CBG MONITORING, ED - Abnormal;  Notable for the following:    Glucose-Capillary 124 (*)    All other components within normal limits  I-STAT CHEM 8, ED - Abnormal; Notable for the following:    BUN 69 (*)    Creatinine, Ser 12.10 (*)    Glucose, Bld 140 (*)    Calcium, Ion 0.88 (*)    Hemoglobin 11.6 (*)    HCT 34.0 (*)    All other components within normal limits  URINALYSIS, ROUTINE W REFLEX MICROSCOPIC    EKG  EKG Interpretation  Date/Time:  Wednesday March 02 2017 12:52:45 EDT Ventricular Rate:  84 PR Interval:    QRS Duration: 100 QT Interval:  380 QTC Calculation: 450 R Axis:   52 Text Interpretation:  Sinus rhythm since last tracing no significant change Confirmed by Daleen Bo [phone]) on 03/02/2017 2:20:27 PM       Radiology Dg Chest Portable 1 View  Result Date: 03/02/2017 CLINICAL DATA:  Shortness of breath.  Hypoxia. EXAM: PORTABLE CHEST 1 VIEW COMPARISON:  Two-view chest 04/21/2016 FINDINGS: Heart is enlarged. Diffuse interstitial and airspace disease is present. Airspace disease is most prominent in the right upper and left lower lobes. Bilateral effusions are present. The visualized soft tissues bony thorax are unremarkable. IMPRESSION: 1. Cardiomegaly with increasing diffuse interstitial and airspace disease likely reflecting  edema and congestive heart failure. 2. Bilateral pleural effusions. 3. Airspace disease is most prominent in the right upper and left lower lobes. This likely reflects atelectasis. Lobar pneumonia is considered less likely. Electronically Signed   By: San Morelle M.D.   On: 03/02/2017 13:11    Procedures Procedures (including critical care time)  Medications Ordered in ED Medications - No data to display   Initial Impression / Assessment and Plan / ED Course  I have reviewed the triage vital signs and the nursing notes.  Pertinent labs & imaging results that were available during my care of the patient were reviewed by me and considered in my medical decision making (see chart for details).      Patient Vitals for the past 24 hrs:  BP Temp Temp src Pulse Resp SpO2 Height Weight  03/02/17 1700 135/65 - - 76 - - - -  03/02/17 1630 133/69 - - 68 - - - -  03/02/17 1600 (!) 142/74 - - 70 - - - -  03/02/17 1530 138/70 - - 72 - - - -  03/02/17 1515 138/71 - - 76 - - - -  03/02/17 1506 126/66 97.5 F (36.4 C) Oral 74 16 100 % - -  03/02/17 1445 124/69 - - 75 14 - - -  03/02/17 1430 (!) 108/56 - - 75 17 100 % - -  03/02/17 1415 (!) 111/53 - - 77 (!) 21 - - -  03/02/17 1400 (!) 128/56 - - 80 (!) 22 - - -  03/02/17 1345 131/60 - - 79 17 - - -  03/02/17 1330 122/64 - - 79 20 - - -  03/02/17 1315 120/60 - - 80 (!) 21 - - -  03/02/17 1312 - - - - (!) 26 100 % - -  03/02/17 1300 (!) 121/54 - - 82 20 - - -  03/02/17 1259 - 98 F (36.7 C) Oral - - - - -  03/02/17 1247 - 98 F (36.7 C) Oral - - - 5\' 8"  (1.727 m) 85.3 kg (188 lb)   13: 40-cast with nephrologist, Dr. Justin Mend, he will dialyze the patient urgently.  1:45 PM  Reevaluation with update and discussion. After initial assessment and treatment, an updated evaluation reveals no change in clinical status.  Findings discussed with the patient and all questions were answered. Lesley Atkin L    Final Clinical Impressions(s) / ED  Diagnoses   Final diagnoses:  Acute pulmonary edema ([place])   Acute pulmonary edema related to noncompliance with usual dialysis treatment.  Doubt pneumonia, sepsis, hyperkalemia or impending vascular collapse.  Nursing Notes Reviewed/ Care Coordinated Applicable Imaging Reviewed Interpretation of Laboratory Data incorporated into ED treatment  We will transition care to the dialysis unit, where the patient will be dialyzed, then anticipate discharge.  New Prescriptions New Prescriptions   No medications on file     Daleen Bo, MD 03/02/17 1740

## 2017-03-03 DIAGNOSIS — N186 End stage renal disease: Secondary | ICD-10-CM | POA: Diagnosis not present

## 2017-03-03 DIAGNOSIS — D689 Coagulation defect, unspecified: Secondary | ICD-10-CM | POA: Diagnosis not present

## 2017-03-03 DIAGNOSIS — N2581 Secondary hyperparathyroidism of renal origin: Secondary | ICD-10-CM | POA: Diagnosis not present

## 2017-03-03 DIAGNOSIS — D631 Anemia in chronic kidney disease: Secondary | ICD-10-CM | POA: Diagnosis not present

## 2017-03-03 DIAGNOSIS — E1129 Type 2 diabetes mellitus with other diabetic kidney complication: Secondary | ICD-10-CM | POA: Diagnosis not present

## 2017-03-03 LAB — CBG MONITORING, ED: GLUCOSE-CAPILLARY: 118 mg/dL — AB (ref 65–99)

## 2017-03-05 DIAGNOSIS — D689 Coagulation defect, unspecified: Secondary | ICD-10-CM | POA: Diagnosis not present

## 2017-03-05 DIAGNOSIS — E1129 Type 2 diabetes mellitus with other diabetic kidney complication: Secondary | ICD-10-CM | POA: Diagnosis not present

## 2017-03-05 DIAGNOSIS — N186 End stage renal disease: Secondary | ICD-10-CM | POA: Diagnosis not present

## 2017-03-05 DIAGNOSIS — D631 Anemia in chronic kidney disease: Secondary | ICD-10-CM | POA: Diagnosis not present

## 2017-03-05 DIAGNOSIS — N2581 Secondary hyperparathyroidism of renal origin: Secondary | ICD-10-CM | POA: Diagnosis not present

## 2017-03-08 DIAGNOSIS — D689 Coagulation defect, unspecified: Secondary | ICD-10-CM | POA: Diagnosis not present

## 2017-03-08 DIAGNOSIS — D631 Anemia in chronic kidney disease: Secondary | ICD-10-CM | POA: Diagnosis not present

## 2017-03-08 DIAGNOSIS — N186 End stage renal disease: Secondary | ICD-10-CM | POA: Diagnosis not present

## 2017-03-08 DIAGNOSIS — N2581 Secondary hyperparathyroidism of renal origin: Secondary | ICD-10-CM | POA: Diagnosis not present

## 2017-03-08 DIAGNOSIS — E1129 Type 2 diabetes mellitus with other diabetic kidney complication: Secondary | ICD-10-CM | POA: Diagnosis not present

## 2017-03-10 DIAGNOSIS — E1129 Type 2 diabetes mellitus with other diabetic kidney complication: Secondary | ICD-10-CM | POA: Diagnosis not present

## 2017-03-10 DIAGNOSIS — N186 End stage renal disease: Secondary | ICD-10-CM | POA: Diagnosis not present

## 2017-03-10 DIAGNOSIS — N2581 Secondary hyperparathyroidism of renal origin: Secondary | ICD-10-CM | POA: Diagnosis not present

## 2017-03-10 DIAGNOSIS — D631 Anemia in chronic kidney disease: Secondary | ICD-10-CM | POA: Diagnosis not present

## 2017-03-10 DIAGNOSIS — D689 Coagulation defect, unspecified: Secondary | ICD-10-CM | POA: Diagnosis not present

## 2017-03-12 DIAGNOSIS — D689 Coagulation defect, unspecified: Secondary | ICD-10-CM | POA: Diagnosis not present

## 2017-03-12 DIAGNOSIS — E1129 Type 2 diabetes mellitus with other diabetic kidney complication: Secondary | ICD-10-CM | POA: Diagnosis not present

## 2017-03-12 DIAGNOSIS — Z992 Dependence on renal dialysis: Secondary | ICD-10-CM | POA: Diagnosis not present

## 2017-03-12 DIAGNOSIS — N2581 Secondary hyperparathyroidism of renal origin: Secondary | ICD-10-CM | POA: Diagnosis not present

## 2017-03-12 DIAGNOSIS — N186 End stage renal disease: Secondary | ICD-10-CM | POA: Diagnosis not present

## 2017-03-12 DIAGNOSIS — E1122 Type 2 diabetes mellitus with diabetic chronic kidney disease: Secondary | ICD-10-CM | POA: Diagnosis not present

## 2017-03-12 DIAGNOSIS — D631 Anemia in chronic kidney disease: Secondary | ICD-10-CM | POA: Diagnosis not present

## 2017-03-15 DIAGNOSIS — D689 Coagulation defect, unspecified: Secondary | ICD-10-CM | POA: Diagnosis not present

## 2017-03-15 DIAGNOSIS — N186 End stage renal disease: Secondary | ICD-10-CM | POA: Diagnosis not present

## 2017-03-15 DIAGNOSIS — D631 Anemia in chronic kidney disease: Secondary | ICD-10-CM | POA: Diagnosis not present

## 2017-03-15 DIAGNOSIS — E1129 Type 2 diabetes mellitus with other diabetic kidney complication: Secondary | ICD-10-CM | POA: Diagnosis not present

## 2017-03-15 DIAGNOSIS — N2581 Secondary hyperparathyroidism of renal origin: Secondary | ICD-10-CM | POA: Diagnosis not present

## 2017-03-17 DIAGNOSIS — D689 Coagulation defect, unspecified: Secondary | ICD-10-CM | POA: Diagnosis not present

## 2017-03-17 DIAGNOSIS — D631 Anemia in chronic kidney disease: Secondary | ICD-10-CM | POA: Diagnosis not present

## 2017-03-17 DIAGNOSIS — N2581 Secondary hyperparathyroidism of renal origin: Secondary | ICD-10-CM | POA: Diagnosis not present

## 2017-03-17 DIAGNOSIS — E1129 Type 2 diabetes mellitus with other diabetic kidney complication: Secondary | ICD-10-CM | POA: Diagnosis not present

## 2017-03-17 DIAGNOSIS — N186 End stage renal disease: Secondary | ICD-10-CM | POA: Diagnosis not present

## 2017-03-19 DIAGNOSIS — N2581 Secondary hyperparathyroidism of renal origin: Secondary | ICD-10-CM | POA: Diagnosis not present

## 2017-03-19 DIAGNOSIS — N186 End stage renal disease: Secondary | ICD-10-CM | POA: Diagnosis not present

## 2017-03-19 DIAGNOSIS — D689 Coagulation defect, unspecified: Secondary | ICD-10-CM | POA: Diagnosis not present

## 2017-03-19 DIAGNOSIS — E1129 Type 2 diabetes mellitus with other diabetic kidney complication: Secondary | ICD-10-CM | POA: Diagnosis not present

## 2017-03-19 DIAGNOSIS — D631 Anemia in chronic kidney disease: Secondary | ICD-10-CM | POA: Diagnosis not present

## 2017-03-22 DIAGNOSIS — D689 Coagulation defect, unspecified: Secondary | ICD-10-CM | POA: Diagnosis not present

## 2017-03-22 DIAGNOSIS — N186 End stage renal disease: Secondary | ICD-10-CM | POA: Diagnosis not present

## 2017-03-22 DIAGNOSIS — D631 Anemia in chronic kidney disease: Secondary | ICD-10-CM | POA: Diagnosis not present

## 2017-03-22 DIAGNOSIS — N2581 Secondary hyperparathyroidism of renal origin: Secondary | ICD-10-CM | POA: Diagnosis not present

## 2017-03-22 DIAGNOSIS — E1129 Type 2 diabetes mellitus with other diabetic kidney complication: Secondary | ICD-10-CM | POA: Diagnosis not present

## 2017-03-24 DIAGNOSIS — N2581 Secondary hyperparathyroidism of renal origin: Secondary | ICD-10-CM | POA: Diagnosis not present

## 2017-03-24 DIAGNOSIS — E1129 Type 2 diabetes mellitus with other diabetic kidney complication: Secondary | ICD-10-CM | POA: Diagnosis not present

## 2017-03-24 DIAGNOSIS — N186 End stage renal disease: Secondary | ICD-10-CM | POA: Diagnosis not present

## 2017-03-24 DIAGNOSIS — D631 Anemia in chronic kidney disease: Secondary | ICD-10-CM | POA: Diagnosis not present

## 2017-03-24 DIAGNOSIS — D689 Coagulation defect, unspecified: Secondary | ICD-10-CM | POA: Diagnosis not present

## 2017-03-26 DIAGNOSIS — D631 Anemia in chronic kidney disease: Secondary | ICD-10-CM | POA: Diagnosis not present

## 2017-03-26 DIAGNOSIS — N186 End stage renal disease: Secondary | ICD-10-CM | POA: Diagnosis not present

## 2017-03-26 DIAGNOSIS — N2581 Secondary hyperparathyroidism of renal origin: Secondary | ICD-10-CM | POA: Diagnosis not present

## 2017-03-26 DIAGNOSIS — E1129 Type 2 diabetes mellitus with other diabetic kidney complication: Secondary | ICD-10-CM | POA: Diagnosis not present

## 2017-03-26 DIAGNOSIS — D689 Coagulation defect, unspecified: Secondary | ICD-10-CM | POA: Diagnosis not present

## 2017-03-29 DIAGNOSIS — N2581 Secondary hyperparathyroidism of renal origin: Secondary | ICD-10-CM | POA: Diagnosis not present

## 2017-03-29 DIAGNOSIS — D689 Coagulation defect, unspecified: Secondary | ICD-10-CM | POA: Diagnosis not present

## 2017-03-29 DIAGNOSIS — D631 Anemia in chronic kidney disease: Secondary | ICD-10-CM | POA: Diagnosis not present

## 2017-03-29 DIAGNOSIS — N186 End stage renal disease: Secondary | ICD-10-CM | POA: Diagnosis not present

## 2017-03-29 DIAGNOSIS — E1129 Type 2 diabetes mellitus with other diabetic kidney complication: Secondary | ICD-10-CM | POA: Diagnosis not present

## 2017-03-31 DIAGNOSIS — D631 Anemia in chronic kidney disease: Secondary | ICD-10-CM | POA: Diagnosis not present

## 2017-03-31 DIAGNOSIS — D689 Coagulation defect, unspecified: Secondary | ICD-10-CM | POA: Diagnosis not present

## 2017-03-31 DIAGNOSIS — N2581 Secondary hyperparathyroidism of renal origin: Secondary | ICD-10-CM | POA: Diagnosis not present

## 2017-03-31 DIAGNOSIS — N186 End stage renal disease: Secondary | ICD-10-CM | POA: Diagnosis not present

## 2017-03-31 DIAGNOSIS — E1129 Type 2 diabetes mellitus with other diabetic kidney complication: Secondary | ICD-10-CM | POA: Diagnosis not present

## 2017-04-05 DIAGNOSIS — E1129 Type 2 diabetes mellitus with other diabetic kidney complication: Secondary | ICD-10-CM | POA: Diagnosis not present

## 2017-04-05 DIAGNOSIS — D631 Anemia in chronic kidney disease: Secondary | ICD-10-CM | POA: Diagnosis not present

## 2017-04-05 DIAGNOSIS — N186 End stage renal disease: Secondary | ICD-10-CM | POA: Diagnosis not present

## 2017-04-05 DIAGNOSIS — N2581 Secondary hyperparathyroidism of renal origin: Secondary | ICD-10-CM | POA: Diagnosis not present

## 2017-04-05 DIAGNOSIS — D689 Coagulation defect, unspecified: Secondary | ICD-10-CM | POA: Diagnosis not present

## 2017-04-07 DIAGNOSIS — N186 End stage renal disease: Secondary | ICD-10-CM | POA: Diagnosis not present

## 2017-04-07 DIAGNOSIS — D631 Anemia in chronic kidney disease: Secondary | ICD-10-CM | POA: Diagnosis not present

## 2017-04-07 DIAGNOSIS — E1129 Type 2 diabetes mellitus with other diabetic kidney complication: Secondary | ICD-10-CM | POA: Diagnosis not present

## 2017-04-07 DIAGNOSIS — N2581 Secondary hyperparathyroidism of renal origin: Secondary | ICD-10-CM | POA: Diagnosis not present

## 2017-04-07 DIAGNOSIS — D689 Coagulation defect, unspecified: Secondary | ICD-10-CM | POA: Diagnosis not present

## 2017-04-08 DIAGNOSIS — N186 End stage renal disease: Secondary | ICD-10-CM | POA: Diagnosis not present

## 2017-04-08 DIAGNOSIS — N2581 Secondary hyperparathyroidism of renal origin: Secondary | ICD-10-CM | POA: Diagnosis not present

## 2017-04-08 DIAGNOSIS — D689 Coagulation defect, unspecified: Secondary | ICD-10-CM | POA: Diagnosis not present

## 2017-04-08 DIAGNOSIS — E1129 Type 2 diabetes mellitus with other diabetic kidney complication: Secondary | ICD-10-CM | POA: Diagnosis not present

## 2017-04-08 DIAGNOSIS — D631 Anemia in chronic kidney disease: Secondary | ICD-10-CM | POA: Diagnosis not present

## 2017-04-09 DIAGNOSIS — N2581 Secondary hyperparathyroidism of renal origin: Secondary | ICD-10-CM | POA: Diagnosis not present

## 2017-04-09 DIAGNOSIS — N186 End stage renal disease: Secondary | ICD-10-CM | POA: Diagnosis not present

## 2017-04-09 DIAGNOSIS — D689 Coagulation defect, unspecified: Secondary | ICD-10-CM | POA: Diagnosis not present

## 2017-04-09 DIAGNOSIS — E1129 Type 2 diabetes mellitus with other diabetic kidney complication: Secondary | ICD-10-CM | POA: Diagnosis not present

## 2017-04-09 DIAGNOSIS — D631 Anemia in chronic kidney disease: Secondary | ICD-10-CM | POA: Diagnosis not present

## 2017-04-12 DIAGNOSIS — N186 End stage renal disease: Secondary | ICD-10-CM | POA: Diagnosis not present

## 2017-04-12 DIAGNOSIS — D689 Coagulation defect, unspecified: Secondary | ICD-10-CM | POA: Diagnosis not present

## 2017-04-12 DIAGNOSIS — Z992 Dependence on renal dialysis: Secondary | ICD-10-CM | POA: Diagnosis not present

## 2017-04-12 DIAGNOSIS — E1129 Type 2 diabetes mellitus with other diabetic kidney complication: Secondary | ICD-10-CM | POA: Diagnosis not present

## 2017-04-12 DIAGNOSIS — D631 Anemia in chronic kidney disease: Secondary | ICD-10-CM | POA: Diagnosis not present

## 2017-04-12 DIAGNOSIS — N2581 Secondary hyperparathyroidism of renal origin: Secondary | ICD-10-CM | POA: Diagnosis not present

## 2017-04-12 DIAGNOSIS — E1122 Type 2 diabetes mellitus with diabetic chronic kidney disease: Secondary | ICD-10-CM | POA: Diagnosis not present

## 2017-04-14 DIAGNOSIS — D689 Coagulation defect, unspecified: Secondary | ICD-10-CM | POA: Diagnosis not present

## 2017-04-14 DIAGNOSIS — N2581 Secondary hyperparathyroidism of renal origin: Secondary | ICD-10-CM | POA: Diagnosis not present

## 2017-04-14 DIAGNOSIS — N186 End stage renal disease: Secondary | ICD-10-CM | POA: Diagnosis not present

## 2017-04-14 DIAGNOSIS — E1129 Type 2 diabetes mellitus with other diabetic kidney complication: Secondary | ICD-10-CM | POA: Diagnosis not present

## 2017-04-16 DIAGNOSIS — N186 End stage renal disease: Secondary | ICD-10-CM | POA: Diagnosis not present

## 2017-04-16 DIAGNOSIS — D689 Coagulation defect, unspecified: Secondary | ICD-10-CM | POA: Diagnosis not present

## 2017-04-16 DIAGNOSIS — N2581 Secondary hyperparathyroidism of renal origin: Secondary | ICD-10-CM | POA: Diagnosis not present

## 2017-04-16 DIAGNOSIS — E1129 Type 2 diabetes mellitus with other diabetic kidney complication: Secondary | ICD-10-CM | POA: Diagnosis not present

## 2017-04-21 DIAGNOSIS — E1129 Type 2 diabetes mellitus with other diabetic kidney complication: Secondary | ICD-10-CM | POA: Diagnosis not present

## 2017-04-21 DIAGNOSIS — D689 Coagulation defect, unspecified: Secondary | ICD-10-CM | POA: Diagnosis not present

## 2017-04-21 DIAGNOSIS — N2581 Secondary hyperparathyroidism of renal origin: Secondary | ICD-10-CM | POA: Diagnosis not present

## 2017-04-21 DIAGNOSIS — N186 End stage renal disease: Secondary | ICD-10-CM | POA: Diagnosis not present

## 2017-04-23 DIAGNOSIS — D689 Coagulation defect, unspecified: Secondary | ICD-10-CM | POA: Diagnosis not present

## 2017-04-23 DIAGNOSIS — N2581 Secondary hyperparathyroidism of renal origin: Secondary | ICD-10-CM | POA: Diagnosis not present

## 2017-04-23 DIAGNOSIS — E1129 Type 2 diabetes mellitus with other diabetic kidney complication: Secondary | ICD-10-CM | POA: Diagnosis not present

## 2017-04-23 DIAGNOSIS — N186 End stage renal disease: Secondary | ICD-10-CM | POA: Diagnosis not present

## 2017-04-26 DIAGNOSIS — N186 End stage renal disease: Secondary | ICD-10-CM | POA: Diagnosis not present

## 2017-04-26 DIAGNOSIS — D689 Coagulation defect, unspecified: Secondary | ICD-10-CM | POA: Diagnosis not present

## 2017-04-26 DIAGNOSIS — E1129 Type 2 diabetes mellitus with other diabetic kidney complication: Secondary | ICD-10-CM | POA: Diagnosis not present

## 2017-04-26 DIAGNOSIS — N2581 Secondary hyperparathyroidism of renal origin: Secondary | ICD-10-CM | POA: Diagnosis not present

## 2017-04-28 DIAGNOSIS — D689 Coagulation defect, unspecified: Secondary | ICD-10-CM | POA: Diagnosis not present

## 2017-04-28 DIAGNOSIS — N2581 Secondary hyperparathyroidism of renal origin: Secondary | ICD-10-CM | POA: Diagnosis not present

## 2017-04-28 DIAGNOSIS — N186 End stage renal disease: Secondary | ICD-10-CM | POA: Diagnosis not present

## 2017-04-28 DIAGNOSIS — E1129 Type 2 diabetes mellitus with other diabetic kidney complication: Secondary | ICD-10-CM | POA: Diagnosis not present

## 2017-04-30 DIAGNOSIS — N186 End stage renal disease: Secondary | ICD-10-CM | POA: Diagnosis not present

## 2017-04-30 DIAGNOSIS — D689 Coagulation defect, unspecified: Secondary | ICD-10-CM | POA: Diagnosis not present

## 2017-04-30 DIAGNOSIS — E1129 Type 2 diabetes mellitus with other diabetic kidney complication: Secondary | ICD-10-CM | POA: Diagnosis not present

## 2017-04-30 DIAGNOSIS — N2581 Secondary hyperparathyroidism of renal origin: Secondary | ICD-10-CM | POA: Diagnosis not present

## 2017-05-03 ENCOUNTER — Emergency Department (HOSPITAL_COMMUNITY)
Admission: EM | Admit: 2017-05-03 | Discharge: 2017-05-03 | Disposition: A | Discharge status: Home / Self Care | Payer: Medicare Other | Attending: Emergency Medicine | Admitting: Emergency Medicine

## 2017-05-03 ENCOUNTER — Encounter (HOSPITAL_COMMUNITY): Payer: Self-pay | Admitting: Emergency Medicine

## 2017-05-03 DIAGNOSIS — I953 Hypotension of hemodialysis: Secondary | ICD-10-CM | POA: Diagnosis not present

## 2017-05-03 DIAGNOSIS — Z79899 Other long term (current) drug therapy: Secondary | ICD-10-CM | POA: Diagnosis not present

## 2017-05-03 DIAGNOSIS — Z992 Dependence on renal dialysis: Secondary | ICD-10-CM | POA: Insufficient documentation

## 2017-05-03 DIAGNOSIS — Z7982 Long term (current) use of aspirin: Secondary | ICD-10-CM | POA: Diagnosis not present

## 2017-05-03 DIAGNOSIS — R404 Transient alteration of awareness: Secondary | ICD-10-CM | POA: Diagnosis not present

## 2017-05-03 DIAGNOSIS — Z794 Long term (current) use of insulin: Secondary | ICD-10-CM | POA: Insufficient documentation

## 2017-05-03 DIAGNOSIS — D689 Coagulation defect, unspecified: Secondary | ICD-10-CM | POA: Diagnosis not present

## 2017-05-03 DIAGNOSIS — N186 End stage renal disease: Secondary | ICD-10-CM

## 2017-05-03 DIAGNOSIS — E1122 Type 2 diabetes mellitus with diabetic chronic kidney disease: Secondary | ICD-10-CM | POA: Diagnosis not present

## 2017-05-03 DIAGNOSIS — I120 Hypertensive chronic kidney disease with stage 5 chronic kidney disease or end stage renal disease: Secondary | ICD-10-CM | POA: Insufficient documentation

## 2017-05-03 DIAGNOSIS — R55 Syncope and collapse: Secondary | ICD-10-CM | POA: Diagnosis not present

## 2017-05-03 DIAGNOSIS — E1129 Type 2 diabetes mellitus with other diabetic kidney complication: Secondary | ICD-10-CM | POA: Diagnosis not present

## 2017-05-03 DIAGNOSIS — N2581 Secondary hyperparathyroidism of renal origin: Secondary | ICD-10-CM | POA: Diagnosis not present

## 2017-05-03 DIAGNOSIS — Z87891 Personal history of nicotine dependence: Secondary | ICD-10-CM | POA: Insufficient documentation

## 2017-05-03 LAB — CBC
HCT: 35.4 % — ABNORMAL LOW (ref 39.0–52.0)
HEMOGLOBIN: 11.6 g/dL — AB (ref 13.0–17.0)
MCH: 28.4 pg (ref 26.0–34.0)
MCHC: 32.8 g/dL (ref 30.0–36.0)
MCV: 86.8 fL (ref 78.0–100.0)
PLATELETS: 204 10*3/uL (ref 150–400)
RBC: 4.08 MIL/uL — AB (ref 4.22–5.81)
RDW: 14.3 % (ref 11.5–15.5)
WBC: 8.2 10*3/uL (ref 4.0–10.5)

## 2017-05-03 LAB — BASIC METABOLIC PANEL
ANION GAP: 12 (ref 5–15)
BUN: 40 mg/dL — ABNORMAL HIGH (ref 6–20)
CALCIUM: 9.4 mg/dL (ref 8.9–10.3)
CO2: 29 mmol/L (ref 22–32)
CREATININE: 6.4 mg/dL — AB (ref 0.61–1.24)
Chloride: 95 mmol/L — ABNORMAL LOW (ref 101–111)
GFR calc non Af Amer: 8 mL/min — ABNORMAL LOW (ref >60)
GFR, EST AFRICAN AMERICAN: 9 mL/min — AB (ref >60)
Glucose, Bld: 177 mg/dL — ABNORMAL HIGH (ref 65–99)
Potassium: 3.8 mmol/L (ref 3.5–5.1)
SODIUM: 136 mmol/L (ref 135–145)

## 2017-05-03 LAB — CBG MONITORING, ED: GLUCOSE-CAPILLARY: 168 mg/dL — AB (ref 65–99)

## 2017-05-03 NOTE — ED Notes (Signed)
Given [place] sandwich and sprite zero.

## 2017-05-03 NOTE — ED Notes (Signed)
CBG is 168.

## 2017-05-03 NOTE — Discharge Instructions (Signed)
Follow-up with dialysis as scheduled, and return to the emergency department if you experience any new or concerning symptoms.

## 2017-05-03 NOTE — ED Provider Notes (Signed)
[facility] DEPT Provider Note   CSN: [id number] Arrival date & time: 05/03/17  1052     History   Chief Complaint Chief Complaint  Patient presents with  . Loss of Consciousness    HPI Blake [name] is a 71 y.o. male.  Patient is a 71 year old male with history of diabetes, hypertension, hyperlipidemia, and end-stage renal disease on hemodialysis. He was brought here from his dialysis center after becoming hypotensive and briefly syncopal after his dialysis session. Apparently they dialyzed 4 L of fluid and his blood pressure dropped to 70 systolic. He had a brief loss of consciousness, but woke back up. There is no reported seizure activity and he was appropriate upon waking. He was given 800 mL of fluid back at dialysis and his blood pressures have now normalized. He had no complaints this morning when waking up and has no complaints at present.   The history is provided by the patient.  Loss of Consciousness   This is a new problem. The current episode started less than 1 hour ago. He lost consciousness for a period of less than one minute. The problem is associated with normal activity. Pertinent negatives include bladder incontinence, bowel incontinence, fever and light-headedness. He has tried nothing for the symptoms. The treatment provided no relief.    Past Medical History:  Diagnosis Date  . Acute osteomyelitis of toe of right foot ([place]) 08/02/2015  . Diabetes mellitus without complication ([place])   . Hyperlipidemia   . Hypertension   . Renal disorder     Patient Active Problem List   Diagnosis Date Noted  . Nausea and vomiting 04/21/2016  . Noncompliance with renal dialysis ([place]) 04/21/2016  . Hyperkalemia, diminished renal excretion 04/21/2016  . Nausea & vomiting 04/21/2016  . Encephalopathy, metabolic 14/97/0263  . Acute osteomyelitis of toe of right foot ([place]) 08/02/2015  . Ulcer of toe of right foot ([place]) 08/01/2015  . Hypothermia 08/01/2015  .  Fall 08/01/2015  . Hypoglycemia associated with diabetes ([place]) 08/01/2015  . Occasional numbness/prickling/tingling of fingers  08/23/2014  . 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 Acute pulmonary edema ([name])   . ESRD on dialysis ([place]) 07/07/2014  . Depression 07/07/2014  . Neuropathic pain, leg, bilateral 07/07/2014  . Diabetic retinopathy ([place]) 07/07/2014  . Advance directive discussed with patient 07/07/2014  . Acute respiratory failure ([place]) 06/28/2014  . Scrotal pain 10/19/2012  . 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 Proteinuria 10/17/2012  . Hypoxia 10/17/2012  . Hypertension     Past Surgical History:  Procedure Laterality Date  . AMPUTATION TOE Right 08/04/2015   Procedure: RIGHT GREAT TOE AMPUTATION;  Surgeon: [name], MD;  Location: [facility];  Service: Orthopedics;  Laterality: Right;  . AV FISTULA PLACEMENT Right 07/05/2014   Procedure: ARTERIOVENOUS BRACHIALCEPHALIC (AV) FISTULA CREATION;  Surgeon: Conrad [place], MD;  Location: [facility];  Service: Vascular;  Laterality: Right;  . BACK SURGERY    . ESOPHAGOGASTRODUODENOSCOPY (EGD) WITH PROPOFOL N/A 11/28/2015   Procedure: ESOPHAGOGASTRODUODENOSCOPY (EGD) WITH PROPOFOL;  Surgeon: [name], MD;  [facility];  Service: Endoscopy;  Laterality: N/A;  . INSERTION OF DIALYSIS CATHETER Left 07/05/2014   Procedure: INSERTION OF DIALYSIS CATHETER-LEFT INTERNAL JUGULAR PLACEMENT;  Surgeon: Conrad [place]see, MD;  Location: [facility];  Service: Vascular;  Laterality: Left;  . REMOVAL OF A DIALYSIS CATHETER Right 07/05/2014   Procedure: REMOVAL OF A DIALYSIS CATHETER;  Surgeon: Conrad [place],  MD;  [facility];  Service: Vascular;  Laterality: Right;       Home Medications    Prior to Admission medications   Medication Sig Start Date End Date Taking? Authorizing Provider  aspirin EC 81 MG tablet Take 81 mg by mouth daily.    [provider]  atorvastatin (LIPITOR) 20 MG tablet Take  20 mg by mouth daily.    [provider]  bisacodyl (DULCOLAX) 10 MG suppository Place 1 suppository (10 mg total) rectally daily as needed for moderate constipation. 04/22/16   Mikhail, Velta Addison, DO  buPROPion (WELLBUTRIN XL) 150 MG 24 hr tablet Take 300 mg by mouth daily.    [provider]  DULoxetine (CYMBALTA) 20 MG capsule Take 1 capsule (20 mg total) by mouth daily. Patient taking differently: Take 20 mg by mouth daily after supper.  07/09/14   Elgergawy, Silver Huguenin, MD  multivitamin (RENA-VIT) TABS tablet Take 1 tablet by mouth daily.    [provider]  NOVOLIN R RELION 100 UNIT/ML injection Inject 3-13 Units into the skin 3 (three) times daily with meals as needed for high blood sugar.  06/13/14   [provider]  pantoprazole (PROTONIX) 20 MG tablet Take 1 tablet (20 mg total) by mouth daily. 10/25/15   Patel-Mills, Hanna, PA-C  SENSIPAR 30 MG tablet Take 1 tablet by mouth daily. 07/23/15   [provider]  sevelamer carbonate (RENVELA) 800 MG tablet Take 1 tablet (800 mg total) by mouth 3 (three) times daily with meals. 08/23/14   Conrad [place], MD    Family History Family History  Problem Relation Age of Onset  . Diabetes Father   . Heart attack Father     Social History Social History  Substance Use Topics  . Smoking status: Former Smoker    Years: 5.00    Types: Cigarettes    Quit date: 08/24/1979  . Smokeless tobacco: Never Used  . Alcohol use No     Allergies   Patient has no known allergies.   Review of Systems Review of Systems  Constitutional: Negative for fever.  Cardiovascular: Positive for syncope.  Gastrointestinal: Negative for bowel incontinence.  Genitourinary: Negative for bladder incontinence.  Neurological: Negative for light-headedness.  All other systems reviewed and are negative.    Physical Exam Updated Vital Signs BP 138/67 (BP Location: Left Arm)   Pulse 80   Temp 98.1 F (36.7 C) (Oral)    Resp 16   Ht 5\' 8"  (1.727 m)   Wt 88.5 kg (195 lb)   SpO2 94%   BMI 29.65 kg/m   Physical Exam  Constitutional: He is oriented to person, place, and time. He appears well-developed and well-nourished. No distress.  HENT:  Head: Normocephalic and atraumatic.  Mouth/Throat: Oropharynx is clear and moist.  Eyes: Pupils are equal, round, and reactive to light. EOM are normal.  Neck: Normal range of motion. Neck supple.  Cardiovascular: Normal rate and regular rhythm.  Exam reveals no friction rub.   No murmur heard. Pulmonary/Chest: Effort normal and breath sounds normal. No respiratory distress. He has no wheezes. He has no rales.  Abdominal: Soft. Bowel sounds are normal. He exhibits no distension. There is no tenderness.  Musculoskeletal: Normal range of motion. He exhibits no edema.  Neurological: He is alert and oriented to person, place, and time. No cranial nerve deficit. He exhibits normal muscle tone. Coordination normal.  Skin: Skin is warm and dry. He is not diaphoretic.  Nursing note  and vitals reviewed.    ED Treatments / Results  Labs (all labs ordered are listed, but only abnormal results are displayed) Labs Reviewed  BASIC METABOLIC PANEL  CBC  URINALYSIS, ROUTINE W REFLEX MICROSCOPIC  CBG MONITORING, ED    EKG  EKG Interpretation None       Radiology No results found.  Procedures Procedures (including critical care time)  Medications Ordered in ED Medications - No data to display   Initial Impression / Assessment and Plan / ED Course  I have reviewed the triage vital signs and the nursing notes.  Pertinent labs & imaging results that were available during my care of the patient were reviewed by me and considered in my medical decision making (see chart for details).  Patient sent from dialysis for hypotension and a brief syncopal episode. It sounds as though he was over diuresed of too much fluid. 800 mL was put back in and his blood pressure  improved. He has remained normotensive while in the emergency department and neurologically intact. His laboratory studies are at his baseline. I see no indication for further workup. He has eaten and tolerated this well. He will be discharged, to follow-up as needed for any problems.  Final Clinical Impressions(s) / ED Diagnoses   Final diagnoses:  None    New Prescriptions New Prescriptions   No medications on file     Veryl Speak, MD 05/03/17 1313

## 2017-05-03 NOTE — ED Triage Notes (Signed)
Arrived via EMS from [facility] patient had 4 Liters removed from dialysis and reported BP dropped to 70/42 with a syncope event last for a couple of seconds. Was administered 800 ML normal saline repeat BP 160/76. Patient alert answering and following commands appropriate. CBG 161. States feels tired.

## 2017-05-03 NOTE — ED Notes (Signed)
Pt is alert, dialysis site de-assessed. Denies any pain or concerns.

## 2017-05-05 DIAGNOSIS — N186 End stage renal disease: Secondary | ICD-10-CM | POA: Diagnosis not present

## 2017-05-05 DIAGNOSIS — N2581 Secondary hyperparathyroidism of renal origin: Secondary | ICD-10-CM | POA: Diagnosis not present

## 2017-05-05 DIAGNOSIS — D689 Coagulation defect, unspecified: Secondary | ICD-10-CM | POA: Diagnosis not present

## 2017-05-05 DIAGNOSIS — E1129 Type 2 diabetes mellitus with other diabetic kidney complication: Secondary | ICD-10-CM | POA: Diagnosis not present

## 2017-05-07 DIAGNOSIS — N2581 Secondary hyperparathyroidism of renal origin: Secondary | ICD-10-CM | POA: Diagnosis not present

## 2017-05-07 DIAGNOSIS — D689 Coagulation defect, unspecified: Secondary | ICD-10-CM | POA: Diagnosis not present

## 2017-05-07 DIAGNOSIS — E1129 Type 2 diabetes mellitus with other diabetic kidney complication: Secondary | ICD-10-CM | POA: Diagnosis not present

## 2017-05-07 DIAGNOSIS — N186 End stage renal disease: Secondary | ICD-10-CM | POA: Diagnosis not present

## 2017-05-09 DIAGNOSIS — D689 Coagulation defect, unspecified: Secondary | ICD-10-CM | POA: Diagnosis not present

## 2017-05-09 DIAGNOSIS — E1129 Type 2 diabetes mellitus with other diabetic kidney complication: Secondary | ICD-10-CM | POA: Diagnosis not present

## 2017-05-09 DIAGNOSIS — N2581 Secondary hyperparathyroidism of renal origin: Secondary | ICD-10-CM | POA: Diagnosis not present

## 2017-05-09 DIAGNOSIS — N186 End stage renal disease: Secondary | ICD-10-CM | POA: Diagnosis not present

## 2017-05-10 DIAGNOSIS — N186 End stage renal disease: Secondary | ICD-10-CM | POA: Diagnosis not present

## 2017-05-10 DIAGNOSIS — E1129 Type 2 diabetes mellitus with other diabetic kidney complication: Secondary | ICD-10-CM | POA: Diagnosis not present

## 2017-05-10 DIAGNOSIS — N2581 Secondary hyperparathyroidism of renal origin: Secondary | ICD-10-CM | POA: Diagnosis not present

## 2017-05-10 DIAGNOSIS — D689 Coagulation defect, unspecified: Secondary | ICD-10-CM | POA: Diagnosis not present

## 2017-05-12 DIAGNOSIS — E1129 Type 2 diabetes mellitus with other diabetic kidney complication: Secondary | ICD-10-CM | POA: Diagnosis not present

## 2017-05-12 DIAGNOSIS — N2581 Secondary hyperparathyroidism of renal origin: Secondary | ICD-10-CM | POA: Diagnosis not present

## 2017-05-12 DIAGNOSIS — N186 End stage renal disease: Secondary | ICD-10-CM | POA: Diagnosis not present

## 2017-05-12 DIAGNOSIS — D689 Coagulation defect, unspecified: Secondary | ICD-10-CM | POA: Diagnosis not present

## 2017-05-13 DIAGNOSIS — E1122 Type 2 diabetes mellitus with diabetic chronic kidney disease: Secondary | ICD-10-CM | POA: Diagnosis not present

## 2017-05-13 DIAGNOSIS — N186 End stage renal disease: Secondary | ICD-10-CM | POA: Diagnosis not present

## 2017-05-13 DIAGNOSIS — Z992 Dependence on renal dialysis: Secondary | ICD-10-CM | POA: Diagnosis not present

## 2017-05-14 DIAGNOSIS — E1129 Type 2 diabetes mellitus with other diabetic kidney complication: Secondary | ICD-10-CM | POA: Diagnosis not present

## 2017-05-14 DIAGNOSIS — D689 Coagulation defect, unspecified: Secondary | ICD-10-CM | POA: Diagnosis not present

## 2017-05-14 DIAGNOSIS — N2581 Secondary hyperparathyroidism of renal origin: Secondary | ICD-10-CM | POA: Diagnosis not present

## 2017-05-14 DIAGNOSIS — N186 End stage renal disease: Secondary | ICD-10-CM | POA: Diagnosis not present

## 2017-05-17 DIAGNOSIS — D689 Coagulation defect, unspecified: Secondary | ICD-10-CM | POA: Diagnosis not present

## 2017-05-17 DIAGNOSIS — N2581 Secondary hyperparathyroidism of renal origin: Secondary | ICD-10-CM | POA: Diagnosis not present

## 2017-05-17 DIAGNOSIS — E1129 Type 2 diabetes mellitus with other diabetic kidney complication: Secondary | ICD-10-CM | POA: Diagnosis not present

## 2017-05-17 DIAGNOSIS — N186 End stage renal disease: Secondary | ICD-10-CM | POA: Diagnosis not present

## 2017-05-19 DIAGNOSIS — E11311 Type 2 diabetes mellitus with unspecified diabetic retinopathy with macular edema: Secondary | ICD-10-CM | POA: Diagnosis not present

## 2017-05-19 DIAGNOSIS — E7800 Pure hypercholesterolemia, unspecified: Secondary | ICD-10-CM | POA: Diagnosis not present

## 2017-05-19 DIAGNOSIS — N186 End stage renal disease: Secondary | ICD-10-CM | POA: Diagnosis not present

## 2017-05-19 DIAGNOSIS — D689 Coagulation defect, unspecified: Secondary | ICD-10-CM | POA: Diagnosis not present

## 2017-05-19 DIAGNOSIS — I10 Essential (primary) hypertension: Secondary | ICD-10-CM | POA: Diagnosis not present

## 2017-05-19 DIAGNOSIS — Z0000 Encounter for general adult medical examination without abnormal findings: Secondary | ICD-10-CM | POA: Diagnosis not present

## 2017-05-19 DIAGNOSIS — N2581 Secondary hyperparathyroidism of renal origin: Secondary | ICD-10-CM | POA: Diagnosis not present

## 2017-05-19 DIAGNOSIS — E1129 Type 2 diabetes mellitus with other diabetic kidney complication: Secondary | ICD-10-CM | POA: Diagnosis not present

## 2017-05-19 DIAGNOSIS — M1A0790 Idiopathic chronic gout, unspecified ankle and foot, without tophus (tophi): Secondary | ICD-10-CM | POA: Diagnosis not present

## 2017-05-19 DIAGNOSIS — E1165 Type 2 diabetes mellitus with hyperglycemia: Secondary | ICD-10-CM | POA: Diagnosis not present

## 2017-05-21 DIAGNOSIS — N2581 Secondary hyperparathyroidism of renal origin: Secondary | ICD-10-CM | POA: Diagnosis not present

## 2017-05-21 DIAGNOSIS — D689 Coagulation defect, unspecified: Secondary | ICD-10-CM | POA: Diagnosis not present

## 2017-05-21 DIAGNOSIS — E1129 Type 2 diabetes mellitus with other diabetic kidney complication: Secondary | ICD-10-CM | POA: Diagnosis not present

## 2017-05-21 DIAGNOSIS — N186 End stage renal disease: Secondary | ICD-10-CM | POA: Diagnosis not present

## 2017-05-24 DIAGNOSIS — N186 End stage renal disease: Secondary | ICD-10-CM | POA: Diagnosis not present

## 2017-05-24 DIAGNOSIS — N2581 Secondary hyperparathyroidism of renal origin: Secondary | ICD-10-CM | POA: Diagnosis not present

## 2017-05-24 DIAGNOSIS — D689 Coagulation defect, unspecified: Secondary | ICD-10-CM | POA: Diagnosis not present

## 2017-05-24 DIAGNOSIS — E1129 Type 2 diabetes mellitus with other diabetic kidney complication: Secondary | ICD-10-CM | POA: Diagnosis not present

## 2017-05-26 DIAGNOSIS — N2581 Secondary hyperparathyroidism of renal origin: Secondary | ICD-10-CM | POA: Diagnosis not present

## 2017-05-26 DIAGNOSIS — N186 End stage renal disease: Secondary | ICD-10-CM | POA: Diagnosis not present

## 2017-05-26 DIAGNOSIS — D689 Coagulation defect, unspecified: Secondary | ICD-10-CM | POA: Diagnosis not present

## 2017-05-26 DIAGNOSIS — E1129 Type 2 diabetes mellitus with other diabetic kidney complication: Secondary | ICD-10-CM | POA: Diagnosis not present

## 2017-05-28 DIAGNOSIS — D689 Coagulation defect, unspecified: Secondary | ICD-10-CM | POA: Diagnosis not present

## 2017-05-28 DIAGNOSIS — E1129 Type 2 diabetes mellitus with other diabetic kidney complication: Secondary | ICD-10-CM | POA: Diagnosis not present

## 2017-05-28 DIAGNOSIS — N186 End stage renal disease: Secondary | ICD-10-CM | POA: Diagnosis not present

## 2017-05-28 DIAGNOSIS — N2581 Secondary hyperparathyroidism of renal origin: Secondary | ICD-10-CM | POA: Diagnosis not present

## 2017-05-31 DIAGNOSIS — N186 End stage renal disease: Secondary | ICD-10-CM | POA: Diagnosis not present

## 2017-05-31 DIAGNOSIS — E1129 Type 2 diabetes mellitus with other diabetic kidney complication: Secondary | ICD-10-CM | POA: Diagnosis not present

## 2017-05-31 DIAGNOSIS — D689 Coagulation defect, unspecified: Secondary | ICD-10-CM | POA: Diagnosis not present

## 2017-05-31 DIAGNOSIS — N2581 Secondary hyperparathyroidism of renal origin: Secondary | ICD-10-CM | POA: Diagnosis not present

## 2017-06-02 DIAGNOSIS — E1129 Type 2 diabetes mellitus with other diabetic kidney complication: Secondary | ICD-10-CM | POA: Diagnosis not present

## 2017-06-02 DIAGNOSIS — N186 End stage renal disease: Secondary | ICD-10-CM | POA: Diagnosis not present

## 2017-06-02 DIAGNOSIS — N2581 Secondary hyperparathyroidism of renal origin: Secondary | ICD-10-CM | POA: Diagnosis not present

## 2017-06-02 DIAGNOSIS — D689 Coagulation defect, unspecified: Secondary | ICD-10-CM | POA: Diagnosis not present

## 2017-06-04 DIAGNOSIS — N2581 Secondary hyperparathyroidism of renal origin: Secondary | ICD-10-CM | POA: Diagnosis not present

## 2017-06-04 DIAGNOSIS — N186 End stage renal disease: Secondary | ICD-10-CM | POA: Diagnosis not present

## 2017-06-04 DIAGNOSIS — D689 Coagulation defect, unspecified: Secondary | ICD-10-CM | POA: Diagnosis not present

## 2017-06-04 DIAGNOSIS — E1129 Type 2 diabetes mellitus with other diabetic kidney complication: Secondary | ICD-10-CM | POA: Diagnosis not present

## 2017-06-07 DIAGNOSIS — N186 End stage renal disease: Secondary | ICD-10-CM | POA: Diagnosis not present

## 2017-06-07 DIAGNOSIS — D689 Coagulation defect, unspecified: Secondary | ICD-10-CM | POA: Diagnosis not present

## 2017-06-07 DIAGNOSIS — N2581 Secondary hyperparathyroidism of renal origin: Secondary | ICD-10-CM | POA: Diagnosis not present

## 2017-06-07 DIAGNOSIS — E1129 Type 2 diabetes mellitus with other diabetic kidney complication: Secondary | ICD-10-CM | POA: Diagnosis not present

## 2017-06-09 DIAGNOSIS — N186 End stage renal disease: Secondary | ICD-10-CM | POA: Diagnosis not present

## 2017-06-09 DIAGNOSIS — N2581 Secondary hyperparathyroidism of renal origin: Secondary | ICD-10-CM | POA: Diagnosis not present

## 2017-06-09 DIAGNOSIS — D689 Coagulation defect, unspecified: Secondary | ICD-10-CM | POA: Diagnosis not present

## 2017-06-09 DIAGNOSIS — E1129 Type 2 diabetes mellitus with other diabetic kidney complication: Secondary | ICD-10-CM | POA: Diagnosis not present

## 2017-06-11 DIAGNOSIS — D689 Coagulation defect, unspecified: Secondary | ICD-10-CM | POA: Diagnosis not present

## 2017-06-11 DIAGNOSIS — E1129 Type 2 diabetes mellitus with other diabetic kidney complication: Secondary | ICD-10-CM | POA: Diagnosis not present

## 2017-06-11 DIAGNOSIS — N2581 Secondary hyperparathyroidism of renal origin: Secondary | ICD-10-CM | POA: Diagnosis not present

## 2017-06-11 DIAGNOSIS — N186 End stage renal disease: Secondary | ICD-10-CM | POA: Diagnosis not present

## 2017-06-12 DIAGNOSIS — Z992 Dependence on renal dialysis: Secondary | ICD-10-CM | POA: Diagnosis not present

## 2017-06-12 DIAGNOSIS — N186 End stage renal disease: Secondary | ICD-10-CM | POA: Diagnosis not present

## 2017-06-12 DIAGNOSIS — E1122 Type 2 diabetes mellitus with diabetic chronic kidney disease: Secondary | ICD-10-CM | POA: Diagnosis not present

## 2017-06-14 DIAGNOSIS — E1129 Type 2 diabetes mellitus with other diabetic kidney complication: Secondary | ICD-10-CM | POA: Diagnosis not present

## 2017-06-14 DIAGNOSIS — N186 End stage renal disease: Secondary | ICD-10-CM | POA: Diagnosis not present

## 2017-06-14 DIAGNOSIS — D689 Coagulation defect, unspecified: Secondary | ICD-10-CM | POA: Diagnosis not present

## 2017-06-14 DIAGNOSIS — N2581 Secondary hyperparathyroidism of renal origin: Secondary | ICD-10-CM | POA: Diagnosis not present

## 2017-06-16 DIAGNOSIS — N186 End stage renal disease: Secondary | ICD-10-CM | POA: Diagnosis not present

## 2017-06-16 DIAGNOSIS — E1129 Type 2 diabetes mellitus with other diabetic kidney complication: Secondary | ICD-10-CM | POA: Diagnosis not present

## 2017-06-16 DIAGNOSIS — N2581 Secondary hyperparathyroidism of renal origin: Secondary | ICD-10-CM | POA: Diagnosis not present

## 2017-06-16 DIAGNOSIS — D689 Coagulation defect, unspecified: Secondary | ICD-10-CM | POA: Diagnosis not present

## 2017-06-18 DIAGNOSIS — D689 Coagulation defect, unspecified: Secondary | ICD-10-CM | POA: Diagnosis not present

## 2017-06-18 DIAGNOSIS — E1129 Type 2 diabetes mellitus with other diabetic kidney complication: Secondary | ICD-10-CM | POA: Diagnosis not present

## 2017-06-18 DIAGNOSIS — N186 End stage renal disease: Secondary | ICD-10-CM | POA: Diagnosis not present

## 2017-06-18 DIAGNOSIS — N2581 Secondary hyperparathyroidism of renal origin: Secondary | ICD-10-CM | POA: Diagnosis not present

## 2017-06-21 DIAGNOSIS — D689 Coagulation defect, unspecified: Secondary | ICD-10-CM | POA: Diagnosis not present

## 2017-06-21 DIAGNOSIS — E1129 Type 2 diabetes mellitus with other diabetic kidney complication: Secondary | ICD-10-CM | POA: Diagnosis not present

## 2017-06-21 DIAGNOSIS — N186 End stage renal disease: Secondary | ICD-10-CM | POA: Diagnosis not present

## 2017-06-21 DIAGNOSIS — N2581 Secondary hyperparathyroidism of renal origin: Secondary | ICD-10-CM | POA: Diagnosis not present

## 2017-06-25 DIAGNOSIS — N186 End stage renal disease: Secondary | ICD-10-CM | POA: Diagnosis not present

## 2017-06-25 DIAGNOSIS — D689 Coagulation defect, unspecified: Secondary | ICD-10-CM | POA: Diagnosis not present

## 2017-06-25 DIAGNOSIS — E1129 Type 2 diabetes mellitus with other diabetic kidney complication: Secondary | ICD-10-CM | POA: Diagnosis not present

## 2017-06-25 DIAGNOSIS — N2581 Secondary hyperparathyroidism of renal origin: Secondary | ICD-10-CM | POA: Diagnosis not present

## 2017-06-28 DIAGNOSIS — N2581 Secondary hyperparathyroidism of renal origin: Secondary | ICD-10-CM | POA: Diagnosis not present

## 2017-06-28 DIAGNOSIS — D689 Coagulation defect, unspecified: Secondary | ICD-10-CM | POA: Diagnosis not present

## 2017-06-28 DIAGNOSIS — N186 End stage renal disease: Secondary | ICD-10-CM | POA: Diagnosis not present

## 2017-06-28 DIAGNOSIS — E1129 Type 2 diabetes mellitus with other diabetic kidney complication: Secondary | ICD-10-CM | POA: Diagnosis not present

## 2017-06-30 DIAGNOSIS — N2581 Secondary hyperparathyroidism of renal origin: Secondary | ICD-10-CM | POA: Diagnosis not present

## 2017-06-30 DIAGNOSIS — E1129 Type 2 diabetes mellitus with other diabetic kidney complication: Secondary | ICD-10-CM | POA: Diagnosis not present

## 2017-06-30 DIAGNOSIS — N186 End stage renal disease: Secondary | ICD-10-CM | POA: Diagnosis not present

## 2017-06-30 DIAGNOSIS — D689 Coagulation defect, unspecified: Secondary | ICD-10-CM | POA: Diagnosis not present

## 2017-07-02 DIAGNOSIS — E1129 Type 2 diabetes mellitus with other diabetic kidney complication: Secondary | ICD-10-CM | POA: Diagnosis not present

## 2017-07-02 DIAGNOSIS — N186 End stage renal disease: Secondary | ICD-10-CM | POA: Diagnosis not present

## 2017-07-02 DIAGNOSIS — N2581 Secondary hyperparathyroidism of renal origin: Secondary | ICD-10-CM | POA: Diagnosis not present

## 2017-07-02 DIAGNOSIS — D689 Coagulation defect, unspecified: Secondary | ICD-10-CM | POA: Diagnosis not present

## 2017-07-05 DIAGNOSIS — N2581 Secondary hyperparathyroidism of renal origin: Secondary | ICD-10-CM | POA: Diagnosis not present

## 2017-07-05 DIAGNOSIS — D689 Coagulation defect, unspecified: Secondary | ICD-10-CM | POA: Diagnosis not present

## 2017-07-05 DIAGNOSIS — E1129 Type 2 diabetes mellitus with other diabetic kidney complication: Secondary | ICD-10-CM | POA: Diagnosis not present

## 2017-07-05 DIAGNOSIS — N186 End stage renal disease: Secondary | ICD-10-CM | POA: Diagnosis not present

## 2017-07-07 DIAGNOSIS — N2581 Secondary hyperparathyroidism of renal origin: Secondary | ICD-10-CM | POA: Diagnosis not present

## 2017-07-07 DIAGNOSIS — E1129 Type 2 diabetes mellitus with other diabetic kidney complication: Secondary | ICD-10-CM | POA: Diagnosis not present

## 2017-07-07 DIAGNOSIS — N186 End stage renal disease: Secondary | ICD-10-CM | POA: Diagnosis not present

## 2017-07-07 DIAGNOSIS — D689 Coagulation defect, unspecified: Secondary | ICD-10-CM | POA: Diagnosis not present

## 2017-07-09 DIAGNOSIS — E1129 Type 2 diabetes mellitus with other diabetic kidney complication: Secondary | ICD-10-CM | POA: Diagnosis not present

## 2017-07-09 DIAGNOSIS — N2581 Secondary hyperparathyroidism of renal origin: Secondary | ICD-10-CM | POA: Diagnosis not present

## 2017-07-09 DIAGNOSIS — D689 Coagulation defect, unspecified: Secondary | ICD-10-CM | POA: Diagnosis not present

## 2017-07-09 DIAGNOSIS — N186 End stage renal disease: Secondary | ICD-10-CM | POA: Diagnosis not present

## 2017-07-12 DIAGNOSIS — E1129 Type 2 diabetes mellitus with other diabetic kidney complication: Secondary | ICD-10-CM | POA: Diagnosis not present

## 2017-07-12 DIAGNOSIS — D689 Coagulation defect, unspecified: Secondary | ICD-10-CM | POA: Diagnosis not present

## 2017-07-12 DIAGNOSIS — N186 End stage renal disease: Secondary | ICD-10-CM | POA: Diagnosis not present

## 2017-07-12 DIAGNOSIS — N2581 Secondary hyperparathyroidism of renal origin: Secondary | ICD-10-CM | POA: Diagnosis not present

## 2017-07-13 DIAGNOSIS — Z992 Dependence on renal dialysis: Secondary | ICD-10-CM | POA: Diagnosis not present

## 2017-07-13 DIAGNOSIS — E1122 Type 2 diabetes mellitus with diabetic chronic kidney disease: Secondary | ICD-10-CM | POA: Diagnosis not present

## 2017-07-13 DIAGNOSIS — N186 End stage renal disease: Secondary | ICD-10-CM | POA: Diagnosis not present

## 2017-07-16 DIAGNOSIS — E1129 Type 2 diabetes mellitus with other diabetic kidney complication: Secondary | ICD-10-CM | POA: Diagnosis not present

## 2017-07-16 DIAGNOSIS — N186 End stage renal disease: Secondary | ICD-10-CM | POA: Diagnosis not present

## 2017-07-16 DIAGNOSIS — N2581 Secondary hyperparathyroidism of renal origin: Secondary | ICD-10-CM | POA: Diagnosis not present

## 2017-07-16 DIAGNOSIS — D631 Anemia in chronic kidney disease: Secondary | ICD-10-CM | POA: Diagnosis not present

## 2017-07-16 DIAGNOSIS — D689 Coagulation defect, unspecified: Secondary | ICD-10-CM | POA: Diagnosis not present

## 2017-07-19 DIAGNOSIS — E1129 Type 2 diabetes mellitus with other diabetic kidney complication: Secondary | ICD-10-CM | POA: Diagnosis not present

## 2017-07-19 DIAGNOSIS — D689 Coagulation defect, unspecified: Secondary | ICD-10-CM | POA: Diagnosis not present

## 2017-07-19 DIAGNOSIS — N186 End stage renal disease: Secondary | ICD-10-CM | POA: Diagnosis not present

## 2017-07-19 DIAGNOSIS — N2581 Secondary hyperparathyroidism of renal origin: Secondary | ICD-10-CM | POA: Diagnosis not present

## 2017-07-19 DIAGNOSIS — D631 Anemia in chronic kidney disease: Secondary | ICD-10-CM | POA: Diagnosis not present

## 2017-07-21 DIAGNOSIS — D689 Coagulation defect, unspecified: Secondary | ICD-10-CM | POA: Diagnosis not present

## 2017-07-21 DIAGNOSIS — N2581 Secondary hyperparathyroidism of renal origin: Secondary | ICD-10-CM | POA: Diagnosis not present

## 2017-07-21 DIAGNOSIS — E1129 Type 2 diabetes mellitus with other diabetic kidney complication: Secondary | ICD-10-CM | POA: Diagnosis not present

## 2017-07-21 DIAGNOSIS — N186 End stage renal disease: Secondary | ICD-10-CM | POA: Diagnosis not present

## 2017-07-21 DIAGNOSIS — D631 Anemia in chronic kidney disease: Secondary | ICD-10-CM | POA: Diagnosis not present

## 2017-07-23 DIAGNOSIS — N2581 Secondary hyperparathyroidism of renal origin: Secondary | ICD-10-CM | POA: Diagnosis not present

## 2017-07-23 DIAGNOSIS — D689 Coagulation defect, unspecified: Secondary | ICD-10-CM | POA: Diagnosis not present

## 2017-07-23 DIAGNOSIS — D631 Anemia in chronic kidney disease: Secondary | ICD-10-CM | POA: Diagnosis not present

## 2017-07-23 DIAGNOSIS — N186 End stage renal disease: Secondary | ICD-10-CM | POA: Diagnosis not present

## 2017-07-23 DIAGNOSIS — E1129 Type 2 diabetes mellitus with other diabetic kidney complication: Secondary | ICD-10-CM | POA: Diagnosis not present

## 2017-07-26 DIAGNOSIS — N2581 Secondary hyperparathyroidism of renal origin: Secondary | ICD-10-CM | POA: Diagnosis not present

## 2017-07-26 DIAGNOSIS — D689 Coagulation defect, unspecified: Secondary | ICD-10-CM | POA: Diagnosis not present

## 2017-07-26 DIAGNOSIS — N186 End stage renal disease: Secondary | ICD-10-CM | POA: Diagnosis not present

## 2017-07-26 DIAGNOSIS — E1129 Type 2 diabetes mellitus with other diabetic kidney complication: Secondary | ICD-10-CM | POA: Diagnosis not present

## 2017-07-26 DIAGNOSIS — D631 Anemia in chronic kidney disease: Secondary | ICD-10-CM | POA: Diagnosis not present

## 2017-07-27 DIAGNOSIS — D631 Anemia in chronic kidney disease: Secondary | ICD-10-CM | POA: Diagnosis not present

## 2017-07-27 DIAGNOSIS — D689 Coagulation defect, unspecified: Secondary | ICD-10-CM | POA: Diagnosis not present

## 2017-07-27 DIAGNOSIS — N2581 Secondary hyperparathyroidism of renal origin: Secondary | ICD-10-CM | POA: Diagnosis not present

## 2017-07-27 DIAGNOSIS — E1129 Type 2 diabetes mellitus with other diabetic kidney complication: Secondary | ICD-10-CM | POA: Diagnosis not present

## 2017-07-27 DIAGNOSIS — N186 End stage renal disease: Secondary | ICD-10-CM | POA: Diagnosis not present

## 2017-07-28 DIAGNOSIS — E1129 Type 2 diabetes mellitus with other diabetic kidney complication: Secondary | ICD-10-CM | POA: Diagnosis not present

## 2017-07-28 DIAGNOSIS — N2581 Secondary hyperparathyroidism of renal origin: Secondary | ICD-10-CM | POA: Diagnosis not present

## 2017-07-28 DIAGNOSIS — D689 Coagulation defect, unspecified: Secondary | ICD-10-CM | POA: Diagnosis not present

## 2017-07-28 DIAGNOSIS — N186 End stage renal disease: Secondary | ICD-10-CM | POA: Diagnosis not present

## 2017-07-28 DIAGNOSIS — D631 Anemia in chronic kidney disease: Secondary | ICD-10-CM | POA: Diagnosis not present

## 2017-07-30 DIAGNOSIS — D631 Anemia in chronic kidney disease: Secondary | ICD-10-CM | POA: Diagnosis not present

## 2017-07-30 DIAGNOSIS — D689 Coagulation defect, unspecified: Secondary | ICD-10-CM | POA: Diagnosis not present

## 2017-07-30 DIAGNOSIS — N186 End stage renal disease: Secondary | ICD-10-CM | POA: Diagnosis not present

## 2017-07-30 DIAGNOSIS — E1129 Type 2 diabetes mellitus with other diabetic kidney complication: Secondary | ICD-10-CM | POA: Diagnosis not present

## 2017-07-30 DIAGNOSIS — N2581 Secondary hyperparathyroidism of renal origin: Secondary | ICD-10-CM | POA: Diagnosis not present

## 2017-08-01 DIAGNOSIS — N186 End stage renal disease: Secondary | ICD-10-CM | POA: Diagnosis not present

## 2017-08-01 DIAGNOSIS — D689 Coagulation defect, unspecified: Secondary | ICD-10-CM | POA: Diagnosis not present

## 2017-08-01 DIAGNOSIS — E1129 Type 2 diabetes mellitus with other diabetic kidney complication: Secondary | ICD-10-CM | POA: Diagnosis not present

## 2017-08-01 DIAGNOSIS — N2581 Secondary hyperparathyroidism of renal origin: Secondary | ICD-10-CM | POA: Diagnosis not present

## 2017-08-01 DIAGNOSIS — D631 Anemia in chronic kidney disease: Secondary | ICD-10-CM | POA: Diagnosis not present

## 2017-08-03 DIAGNOSIS — E1129 Type 2 diabetes mellitus with other diabetic kidney complication: Secondary | ICD-10-CM | POA: Diagnosis not present

## 2017-08-03 DIAGNOSIS — N186 End stage renal disease: Secondary | ICD-10-CM | POA: Diagnosis not present

## 2017-08-03 DIAGNOSIS — D689 Coagulation defect, unspecified: Secondary | ICD-10-CM | POA: Diagnosis not present

## 2017-08-03 DIAGNOSIS — D631 Anemia in chronic kidney disease: Secondary | ICD-10-CM | POA: Diagnosis not present

## 2017-08-03 DIAGNOSIS — N2581 Secondary hyperparathyroidism of renal origin: Secondary | ICD-10-CM | POA: Diagnosis not present

## 2017-08-06 DIAGNOSIS — D631 Anemia in chronic kidney disease: Secondary | ICD-10-CM | POA: Diagnosis not present

## 2017-08-06 DIAGNOSIS — E1129 Type 2 diabetes mellitus with other diabetic kidney complication: Secondary | ICD-10-CM | POA: Diagnosis not present

## 2017-08-06 DIAGNOSIS — D689 Coagulation defect, unspecified: Secondary | ICD-10-CM | POA: Diagnosis not present

## 2017-08-06 DIAGNOSIS — N186 End stage renal disease: Secondary | ICD-10-CM | POA: Diagnosis not present

## 2017-08-06 DIAGNOSIS — N2581 Secondary hyperparathyroidism of renal origin: Secondary | ICD-10-CM | POA: Diagnosis not present

## 2017-08-09 DIAGNOSIS — N186 End stage renal disease: Secondary | ICD-10-CM | POA: Diagnosis not present

## 2017-08-09 DIAGNOSIS — E1129 Type 2 diabetes mellitus with other diabetic kidney complication: Secondary | ICD-10-CM | POA: Diagnosis not present

## 2017-08-09 DIAGNOSIS — N2581 Secondary hyperparathyroidism of renal origin: Secondary | ICD-10-CM | POA: Diagnosis not present

## 2017-08-09 DIAGNOSIS — D689 Coagulation defect, unspecified: Secondary | ICD-10-CM | POA: Diagnosis not present

## 2017-08-09 DIAGNOSIS — D631 Anemia in chronic kidney disease: Secondary | ICD-10-CM | POA: Diagnosis not present

## 2017-08-11 DIAGNOSIS — E1129 Type 2 diabetes mellitus with other diabetic kidney complication: Secondary | ICD-10-CM | POA: Diagnosis not present

## 2017-08-11 DIAGNOSIS — D631 Anemia in chronic kidney disease: Secondary | ICD-10-CM | POA: Diagnosis not present

## 2017-08-11 DIAGNOSIS — D689 Coagulation defect, unspecified: Secondary | ICD-10-CM | POA: Diagnosis not present

## 2017-08-11 DIAGNOSIS — N186 End stage renal disease: Secondary | ICD-10-CM | POA: Diagnosis not present

## 2017-08-11 DIAGNOSIS — N2581 Secondary hyperparathyroidism of renal origin: Secondary | ICD-10-CM | POA: Diagnosis not present

## 2017-08-12 DIAGNOSIS — E1122 Type 2 diabetes mellitus with diabetic chronic kidney disease: Secondary | ICD-10-CM | POA: Diagnosis not present

## 2017-08-12 DIAGNOSIS — Z992 Dependence on renal dialysis: Secondary | ICD-10-CM | POA: Diagnosis not present

## 2017-08-12 DIAGNOSIS — N186 End stage renal disease: Secondary | ICD-10-CM | POA: Diagnosis not present

## 2017-08-16 DIAGNOSIS — D689 Coagulation defect, unspecified: Secondary | ICD-10-CM | POA: Diagnosis not present

## 2017-08-16 DIAGNOSIS — N186 End stage renal disease: Secondary | ICD-10-CM | POA: Diagnosis not present

## 2017-08-16 DIAGNOSIS — E1129 Type 2 diabetes mellitus with other diabetic kidney complication: Secondary | ICD-10-CM | POA: Diagnosis not present

## 2017-08-16 DIAGNOSIS — N2581 Secondary hyperparathyroidism of renal origin: Secondary | ICD-10-CM | POA: Diagnosis not present

## 2017-08-18 DIAGNOSIS — D689 Coagulation defect, unspecified: Secondary | ICD-10-CM | POA: Diagnosis not present

## 2017-08-18 DIAGNOSIS — N2581 Secondary hyperparathyroidism of renal origin: Secondary | ICD-10-CM | POA: Diagnosis not present

## 2017-08-18 DIAGNOSIS — N186 End stage renal disease: Secondary | ICD-10-CM | POA: Diagnosis not present

## 2017-08-18 DIAGNOSIS — E1129 Type 2 diabetes mellitus with other diabetic kidney complication: Secondary | ICD-10-CM | POA: Diagnosis not present

## 2017-08-20 DIAGNOSIS — E1129 Type 2 diabetes mellitus with other diabetic kidney complication: Secondary | ICD-10-CM | POA: Diagnosis not present

## 2017-08-20 DIAGNOSIS — N2581 Secondary hyperparathyroidism of renal origin: Secondary | ICD-10-CM | POA: Diagnosis not present

## 2017-08-20 DIAGNOSIS — N186 End stage renal disease: Secondary | ICD-10-CM | POA: Diagnosis not present

## 2017-08-20 DIAGNOSIS — D689 Coagulation defect, unspecified: Secondary | ICD-10-CM | POA: Diagnosis not present

## 2017-08-22 DIAGNOSIS — E1129 Type 2 diabetes mellitus with other diabetic kidney complication: Secondary | ICD-10-CM | POA: Diagnosis not present

## 2017-08-22 DIAGNOSIS — N186 End stage renal disease: Secondary | ICD-10-CM | POA: Diagnosis not present

## 2017-08-22 DIAGNOSIS — D689 Coagulation defect, unspecified: Secondary | ICD-10-CM | POA: Diagnosis not present

## 2017-08-22 DIAGNOSIS — N2581 Secondary hyperparathyroidism of renal origin: Secondary | ICD-10-CM | POA: Diagnosis not present

## 2017-08-30 DIAGNOSIS — D689 Coagulation defect, unspecified: Secondary | ICD-10-CM | POA: Diagnosis not present

## 2017-08-30 DIAGNOSIS — N2581 Secondary hyperparathyroidism of renal origin: Secondary | ICD-10-CM | POA: Diagnosis not present

## 2017-08-30 DIAGNOSIS — N186 End stage renal disease: Secondary | ICD-10-CM | POA: Diagnosis not present

## 2017-08-30 DIAGNOSIS — E1129 Type 2 diabetes mellitus with other diabetic kidney complication: Secondary | ICD-10-CM | POA: Diagnosis not present

## 2017-09-01 DIAGNOSIS — E1129 Type 2 diabetes mellitus with other diabetic kidney complication: Secondary | ICD-10-CM | POA: Diagnosis not present

## 2017-09-01 DIAGNOSIS — N186 End stage renal disease: Secondary | ICD-10-CM | POA: Diagnosis not present

## 2017-09-01 DIAGNOSIS — D689 Coagulation defect, unspecified: Secondary | ICD-10-CM | POA: Diagnosis not present

## 2017-09-01 DIAGNOSIS — N2581 Secondary hyperparathyroidism of renal origin: Secondary | ICD-10-CM | POA: Diagnosis not present

## 2017-09-03 DIAGNOSIS — N2581 Secondary hyperparathyroidism of renal origin: Secondary | ICD-10-CM | POA: Diagnosis not present

## 2017-09-03 DIAGNOSIS — N186 End stage renal disease: Secondary | ICD-10-CM | POA: Diagnosis not present

## 2017-09-03 DIAGNOSIS — D689 Coagulation defect, unspecified: Secondary | ICD-10-CM | POA: Diagnosis not present

## 2017-09-03 DIAGNOSIS — E1129 Type 2 diabetes mellitus with other diabetic kidney complication: Secondary | ICD-10-CM | POA: Diagnosis not present

## 2017-09-05 DIAGNOSIS — N186 End stage renal disease: Secondary | ICD-10-CM | POA: Diagnosis not present

## 2017-09-05 DIAGNOSIS — E1129 Type 2 diabetes mellitus with other diabetic kidney complication: Secondary | ICD-10-CM | POA: Diagnosis not present

## 2017-09-05 DIAGNOSIS — N2581 Secondary hyperparathyroidism of renal origin: Secondary | ICD-10-CM | POA: Diagnosis not present

## 2017-09-05 DIAGNOSIS — D689 Coagulation defect, unspecified: Secondary | ICD-10-CM | POA: Diagnosis not present

## 2017-09-08 DIAGNOSIS — E1129 Type 2 diabetes mellitus with other diabetic kidney complication: Secondary | ICD-10-CM | POA: Diagnosis not present

## 2017-09-08 DIAGNOSIS — D689 Coagulation defect, unspecified: Secondary | ICD-10-CM | POA: Diagnosis not present

## 2017-09-08 DIAGNOSIS — N2581 Secondary hyperparathyroidism of renal origin: Secondary | ICD-10-CM | POA: Diagnosis not present

## 2017-09-08 DIAGNOSIS — N186 End stage renal disease: Secondary | ICD-10-CM | POA: Diagnosis not present

## 2017-09-10 DIAGNOSIS — E1129 Type 2 diabetes mellitus with other diabetic kidney complication: Secondary | ICD-10-CM | POA: Diagnosis not present

## 2017-09-10 DIAGNOSIS — D689 Coagulation defect, unspecified: Secondary | ICD-10-CM | POA: Diagnosis not present

## 2017-09-10 DIAGNOSIS — N186 End stage renal disease: Secondary | ICD-10-CM | POA: Diagnosis not present

## 2017-09-10 DIAGNOSIS — N2581 Secondary hyperparathyroidism of renal origin: Secondary | ICD-10-CM | POA: Diagnosis not present

## 2017-09-12 DIAGNOSIS — Z992 Dependence on renal dialysis: Secondary | ICD-10-CM | POA: Diagnosis not present

## 2017-09-12 DIAGNOSIS — E1122 Type 2 diabetes mellitus with diabetic chronic kidney disease: Secondary | ICD-10-CM | POA: Diagnosis not present

## 2017-09-12 DIAGNOSIS — N186 End stage renal disease: Secondary | ICD-10-CM | POA: Diagnosis not present

## 2017-09-12 DIAGNOSIS — E1129 Type 2 diabetes mellitus with other diabetic kidney complication: Secondary | ICD-10-CM | POA: Diagnosis not present

## 2017-09-12 DIAGNOSIS — D689 Coagulation defect, unspecified: Secondary | ICD-10-CM | POA: Diagnosis not present

## 2017-09-12 DIAGNOSIS — N2581 Secondary hyperparathyroidism of renal origin: Secondary | ICD-10-CM | POA: Diagnosis not present

## 2017-09-15 DIAGNOSIS — N2581 Secondary hyperparathyroidism of renal origin: Secondary | ICD-10-CM | POA: Diagnosis not present

## 2017-09-15 DIAGNOSIS — N186 End stage renal disease: Secondary | ICD-10-CM | POA: Diagnosis not present

## 2017-09-15 DIAGNOSIS — E1129 Type 2 diabetes mellitus with other diabetic kidney complication: Secondary | ICD-10-CM | POA: Diagnosis not present

## 2017-09-15 DIAGNOSIS — E875 Hyperkalemia: Secondary | ICD-10-CM | POA: Diagnosis not present

## 2017-09-15 DIAGNOSIS — D689 Coagulation defect, unspecified: Secondary | ICD-10-CM | POA: Diagnosis not present

## 2017-09-20 DIAGNOSIS — E1129 Type 2 diabetes mellitus with other diabetic kidney complication: Secondary | ICD-10-CM | POA: Diagnosis not present

## 2017-09-20 DIAGNOSIS — E875 Hyperkalemia: Secondary | ICD-10-CM | POA: Diagnosis not present

## 2017-09-20 DIAGNOSIS — N186 End stage renal disease: Secondary | ICD-10-CM | POA: Diagnosis not present

## 2017-09-20 DIAGNOSIS — N2581 Secondary hyperparathyroidism of renal origin: Secondary | ICD-10-CM | POA: Diagnosis not present

## 2017-09-20 DIAGNOSIS — D689 Coagulation defect, unspecified: Secondary | ICD-10-CM | POA: Diagnosis not present

## 2017-09-22 DIAGNOSIS — E1129 Type 2 diabetes mellitus with other diabetic kidney complication: Secondary | ICD-10-CM | POA: Diagnosis not present

## 2017-09-22 DIAGNOSIS — N2581 Secondary hyperparathyroidism of renal origin: Secondary | ICD-10-CM | POA: Diagnosis not present

## 2017-09-22 DIAGNOSIS — N186 End stage renal disease: Secondary | ICD-10-CM | POA: Diagnosis not present

## 2017-09-22 DIAGNOSIS — D689 Coagulation defect, unspecified: Secondary | ICD-10-CM | POA: Diagnosis not present

## 2017-09-22 DIAGNOSIS — E875 Hyperkalemia: Secondary | ICD-10-CM | POA: Diagnosis not present

## 2017-09-24 DIAGNOSIS — N2581 Secondary hyperparathyroidism of renal origin: Secondary | ICD-10-CM | POA: Diagnosis not present

## 2017-09-24 DIAGNOSIS — D689 Coagulation defect, unspecified: Secondary | ICD-10-CM | POA: Diagnosis not present

## 2017-09-24 DIAGNOSIS — E1129 Type 2 diabetes mellitus with other diabetic kidney complication: Secondary | ICD-10-CM | POA: Diagnosis not present

## 2017-09-24 DIAGNOSIS — N186 End stage renal disease: Secondary | ICD-10-CM | POA: Diagnosis not present

## 2017-09-24 DIAGNOSIS — E875 Hyperkalemia: Secondary | ICD-10-CM | POA: Diagnosis not present

## 2017-09-27 DIAGNOSIS — N2581 Secondary hyperparathyroidism of renal origin: Secondary | ICD-10-CM | POA: Diagnosis not present

## 2017-09-27 DIAGNOSIS — E1129 Type 2 diabetes mellitus with other diabetic kidney complication: Secondary | ICD-10-CM | POA: Diagnosis not present

## 2017-09-27 DIAGNOSIS — E875 Hyperkalemia: Secondary | ICD-10-CM | POA: Diagnosis not present

## 2017-09-27 DIAGNOSIS — N186 End stage renal disease: Secondary | ICD-10-CM | POA: Diagnosis not present

## 2017-09-27 DIAGNOSIS — D689 Coagulation defect, unspecified: Secondary | ICD-10-CM | POA: Diagnosis not present

## 2017-09-29 DIAGNOSIS — E875 Hyperkalemia: Secondary | ICD-10-CM | POA: Diagnosis not present

## 2017-09-29 DIAGNOSIS — E1129 Type 2 diabetes mellitus with other diabetic kidney complication: Secondary | ICD-10-CM | POA: Diagnosis not present

## 2017-09-29 DIAGNOSIS — N2581 Secondary hyperparathyroidism of renal origin: Secondary | ICD-10-CM | POA: Diagnosis not present

## 2017-09-29 DIAGNOSIS — D689 Coagulation defect, unspecified: Secondary | ICD-10-CM | POA: Diagnosis not present

## 2017-09-29 DIAGNOSIS — N186 End stage renal disease: Secondary | ICD-10-CM | POA: Diagnosis not present

## 2017-10-01 DIAGNOSIS — N186 End stage renal disease: Secondary | ICD-10-CM | POA: Diagnosis not present

## 2017-10-01 DIAGNOSIS — E875 Hyperkalemia: Secondary | ICD-10-CM | POA: Diagnosis not present

## 2017-10-01 DIAGNOSIS — N2581 Secondary hyperparathyroidism of renal origin: Secondary | ICD-10-CM | POA: Diagnosis not present

## 2017-10-01 DIAGNOSIS — D689 Coagulation defect, unspecified: Secondary | ICD-10-CM | POA: Diagnosis not present

## 2017-10-01 DIAGNOSIS — E1129 Type 2 diabetes mellitus with other diabetic kidney complication: Secondary | ICD-10-CM | POA: Diagnosis not present

## 2017-10-04 DIAGNOSIS — E1129 Type 2 diabetes mellitus with other diabetic kidney complication: Secondary | ICD-10-CM | POA: Diagnosis not present

## 2017-10-04 DIAGNOSIS — D689 Coagulation defect, unspecified: Secondary | ICD-10-CM | POA: Diagnosis not present

## 2017-10-04 DIAGNOSIS — E875 Hyperkalemia: Secondary | ICD-10-CM | POA: Diagnosis not present

## 2017-10-04 DIAGNOSIS — N2581 Secondary hyperparathyroidism of renal origin: Secondary | ICD-10-CM | POA: Diagnosis not present

## 2017-10-04 DIAGNOSIS — N186 End stage renal disease: Secondary | ICD-10-CM | POA: Diagnosis not present

## 2017-10-06 DIAGNOSIS — N186 End stage renal disease: Secondary | ICD-10-CM | POA: Diagnosis not present

## 2017-10-06 DIAGNOSIS — E875 Hyperkalemia: Secondary | ICD-10-CM | POA: Diagnosis not present

## 2017-10-06 DIAGNOSIS — D689 Coagulation defect, unspecified: Secondary | ICD-10-CM | POA: Diagnosis not present

## 2017-10-06 DIAGNOSIS — N2581 Secondary hyperparathyroidism of renal origin: Secondary | ICD-10-CM | POA: Diagnosis not present

## 2017-10-06 DIAGNOSIS — E1129 Type 2 diabetes mellitus with other diabetic kidney complication: Secondary | ICD-10-CM | POA: Diagnosis not present

## 2017-10-08 DIAGNOSIS — E875 Hyperkalemia: Secondary | ICD-10-CM | POA: Diagnosis not present

## 2017-10-08 DIAGNOSIS — N186 End stage renal disease: Secondary | ICD-10-CM | POA: Diagnosis not present

## 2017-10-08 DIAGNOSIS — N2581 Secondary hyperparathyroidism of renal origin: Secondary | ICD-10-CM | POA: Diagnosis not present

## 2017-10-08 DIAGNOSIS — E1129 Type 2 diabetes mellitus with other diabetic kidney complication: Secondary | ICD-10-CM | POA: Diagnosis not present

## 2017-10-08 DIAGNOSIS — D689 Coagulation defect, unspecified: Secondary | ICD-10-CM | POA: Diagnosis not present

## 2017-10-11 DIAGNOSIS — E1129 Type 2 diabetes mellitus with other diabetic kidney complication: Secondary | ICD-10-CM | POA: Diagnosis not present

## 2017-10-11 DIAGNOSIS — N2581 Secondary hyperparathyroidism of renal origin: Secondary | ICD-10-CM | POA: Diagnosis not present

## 2017-10-11 DIAGNOSIS — E875 Hyperkalemia: Secondary | ICD-10-CM | POA: Diagnosis not present

## 2017-10-11 DIAGNOSIS — N186 End stage renal disease: Secondary | ICD-10-CM | POA: Diagnosis not present

## 2017-10-11 DIAGNOSIS — D689 Coagulation defect, unspecified: Secondary | ICD-10-CM | POA: Diagnosis not present

## 2017-10-13 DIAGNOSIS — Z992 Dependence on renal dialysis: Secondary | ICD-10-CM | POA: Diagnosis not present

## 2017-10-13 DIAGNOSIS — D689 Coagulation defect, unspecified: Secondary | ICD-10-CM | POA: Diagnosis not present

## 2017-10-13 DIAGNOSIS — N2581 Secondary hyperparathyroidism of renal origin: Secondary | ICD-10-CM | POA: Diagnosis not present

## 2017-10-13 DIAGNOSIS — E1122 Type 2 diabetes mellitus with diabetic chronic kidney disease: Secondary | ICD-10-CM | POA: Diagnosis not present

## 2017-10-13 DIAGNOSIS — N186 End stage renal disease: Secondary | ICD-10-CM | POA: Diagnosis not present

## 2017-10-13 DIAGNOSIS — E875 Hyperkalemia: Secondary | ICD-10-CM | POA: Diagnosis not present

## 2017-10-13 DIAGNOSIS — E1129 Type 2 diabetes mellitus with other diabetic kidney complication: Secondary | ICD-10-CM | POA: Diagnosis not present

## 2017-10-14 DIAGNOSIS — Z992 Dependence on renal dialysis: Secondary | ICD-10-CM | POA: Diagnosis not present

## 2017-10-14 DIAGNOSIS — N186 End stage renal disease: Secondary | ICD-10-CM | POA: Diagnosis not present

## 2017-10-14 DIAGNOSIS — E1122 Type 2 diabetes mellitus with diabetic chronic kidney disease: Secondary | ICD-10-CM | POA: Diagnosis not present

## 2017-10-15 DIAGNOSIS — E1129 Type 2 diabetes mellitus with other diabetic kidney complication: Secondary | ICD-10-CM | POA: Diagnosis not present

## 2017-10-15 DIAGNOSIS — N186 End stage renal disease: Secondary | ICD-10-CM | POA: Diagnosis not present

## 2017-10-15 DIAGNOSIS — N2581 Secondary hyperparathyroidism of renal origin: Secondary | ICD-10-CM | POA: Diagnosis not present

## 2017-10-15 DIAGNOSIS — D689 Coagulation defect, unspecified: Secondary | ICD-10-CM | POA: Diagnosis not present

## 2017-10-17 DIAGNOSIS — N186 End stage renal disease: Secondary | ICD-10-CM | POA: Diagnosis not present

## 2017-10-17 DIAGNOSIS — E1129 Type 2 diabetes mellitus with other diabetic kidney complication: Secondary | ICD-10-CM | POA: Diagnosis not present

## 2017-10-17 DIAGNOSIS — N2581 Secondary hyperparathyroidism of renal origin: Secondary | ICD-10-CM | POA: Diagnosis not present

## 2017-10-17 DIAGNOSIS — D689 Coagulation defect, unspecified: Secondary | ICD-10-CM | POA: Diagnosis not present

## 2017-10-18 DIAGNOSIS — E1129 Type 2 diabetes mellitus with other diabetic kidney complication: Secondary | ICD-10-CM | POA: Diagnosis not present

## 2017-10-18 DIAGNOSIS — D689 Coagulation defect, unspecified: Secondary | ICD-10-CM | POA: Diagnosis not present

## 2017-10-18 DIAGNOSIS — N2581 Secondary hyperparathyroidism of renal origin: Secondary | ICD-10-CM | POA: Diagnosis not present

## 2017-10-18 DIAGNOSIS — N186 End stage renal disease: Secondary | ICD-10-CM | POA: Diagnosis not present

## 2017-10-20 DIAGNOSIS — D689 Coagulation defect, unspecified: Secondary | ICD-10-CM | POA: Diagnosis not present

## 2017-10-20 DIAGNOSIS — E1129 Type 2 diabetes mellitus with other diabetic kidney complication: Secondary | ICD-10-CM | POA: Diagnosis not present

## 2017-10-20 DIAGNOSIS — N186 End stage renal disease: Secondary | ICD-10-CM | POA: Diagnosis not present

## 2017-10-20 DIAGNOSIS — N2581 Secondary hyperparathyroidism of renal origin: Secondary | ICD-10-CM | POA: Diagnosis not present

## 2017-10-22 DIAGNOSIS — N186 End stage renal disease: Secondary | ICD-10-CM | POA: Diagnosis not present

## 2017-10-22 DIAGNOSIS — E1129 Type 2 diabetes mellitus with other diabetic kidney complication: Secondary | ICD-10-CM | POA: Diagnosis not present

## 2017-10-22 DIAGNOSIS — N2581 Secondary hyperparathyroidism of renal origin: Secondary | ICD-10-CM | POA: Diagnosis not present

## 2017-10-22 DIAGNOSIS — D689 Coagulation defect, unspecified: Secondary | ICD-10-CM | POA: Diagnosis not present

## 2017-10-25 DIAGNOSIS — D689 Coagulation defect, unspecified: Secondary | ICD-10-CM | POA: Diagnosis not present

## 2017-10-25 DIAGNOSIS — E1129 Type 2 diabetes mellitus with other diabetic kidney complication: Secondary | ICD-10-CM | POA: Diagnosis not present

## 2017-10-25 DIAGNOSIS — N2581 Secondary hyperparathyroidism of renal origin: Secondary | ICD-10-CM | POA: Diagnosis not present

## 2017-10-25 DIAGNOSIS — N186 End stage renal disease: Secondary | ICD-10-CM | POA: Diagnosis not present

## 2017-10-27 DIAGNOSIS — N2581 Secondary hyperparathyroidism of renal origin: Secondary | ICD-10-CM | POA: Diagnosis not present

## 2017-10-27 DIAGNOSIS — N186 End stage renal disease: Secondary | ICD-10-CM | POA: Diagnosis not present

## 2017-10-27 DIAGNOSIS — D689 Coagulation defect, unspecified: Secondary | ICD-10-CM | POA: Diagnosis not present

## 2017-10-27 DIAGNOSIS — E1129 Type 2 diabetes mellitus with other diabetic kidney complication: Secondary | ICD-10-CM | POA: Diagnosis not present

## 2017-10-29 DIAGNOSIS — E1129 Type 2 diabetes mellitus with other diabetic kidney complication: Secondary | ICD-10-CM | POA: Diagnosis not present

## 2017-10-29 DIAGNOSIS — D689 Coagulation defect, unspecified: Secondary | ICD-10-CM | POA: Diagnosis not present

## 2017-10-29 DIAGNOSIS — N2581 Secondary hyperparathyroidism of renal origin: Secondary | ICD-10-CM | POA: Diagnosis not present

## 2017-10-29 DIAGNOSIS — N186 End stage renal disease: Secondary | ICD-10-CM | POA: Diagnosis not present

## 2017-11-01 DIAGNOSIS — N186 End stage renal disease: Secondary | ICD-10-CM | POA: Diagnosis not present

## 2017-11-01 DIAGNOSIS — D689 Coagulation defect, unspecified: Secondary | ICD-10-CM | POA: Diagnosis not present

## 2017-11-01 DIAGNOSIS — E1129 Type 2 diabetes mellitus with other diabetic kidney complication: Secondary | ICD-10-CM | POA: Diagnosis not present

## 2017-11-01 DIAGNOSIS — N2581 Secondary hyperparathyroidism of renal origin: Secondary | ICD-10-CM | POA: Diagnosis not present

## 2017-11-03 DIAGNOSIS — N186 End stage renal disease: Secondary | ICD-10-CM | POA: Diagnosis not present

## 2017-11-03 DIAGNOSIS — D689 Coagulation defect, unspecified: Secondary | ICD-10-CM | POA: Diagnosis not present

## 2017-11-03 DIAGNOSIS — N2581 Secondary hyperparathyroidism of renal origin: Secondary | ICD-10-CM | POA: Diagnosis not present

## 2017-11-03 DIAGNOSIS — E1129 Type 2 diabetes mellitus with other diabetic kidney complication: Secondary | ICD-10-CM | POA: Diagnosis not present

## 2017-11-05 DIAGNOSIS — N2581 Secondary hyperparathyroidism of renal origin: Secondary | ICD-10-CM | POA: Diagnosis not present

## 2017-11-05 DIAGNOSIS — N186 End stage renal disease: Secondary | ICD-10-CM | POA: Diagnosis not present

## 2017-11-05 DIAGNOSIS — D689 Coagulation defect, unspecified: Secondary | ICD-10-CM | POA: Diagnosis not present

## 2017-11-05 DIAGNOSIS — E1129 Type 2 diabetes mellitus with other diabetic kidney complication: Secondary | ICD-10-CM | POA: Diagnosis not present

## 2017-11-10 DIAGNOSIS — D689 Coagulation defect, unspecified: Secondary | ICD-10-CM | POA: Diagnosis not present

## 2017-11-10 DIAGNOSIS — N2581 Secondary hyperparathyroidism of renal origin: Secondary | ICD-10-CM | POA: Diagnosis not present

## 2017-11-10 DIAGNOSIS — E1129 Type 2 diabetes mellitus with other diabetic kidney complication: Secondary | ICD-10-CM | POA: Diagnosis not present

## 2017-11-10 DIAGNOSIS — N186 End stage renal disease: Secondary | ICD-10-CM | POA: Diagnosis not present

## 2017-11-11 DIAGNOSIS — E1122 Type 2 diabetes mellitus with diabetic chronic kidney disease: Secondary | ICD-10-CM | POA: Diagnosis not present

## 2017-11-11 DIAGNOSIS — Z992 Dependence on renal dialysis: Secondary | ICD-10-CM | POA: Diagnosis not present

## 2017-11-11 DIAGNOSIS — N186 End stage renal disease: Secondary | ICD-10-CM | POA: Diagnosis not present

## 2017-11-11 IMAGING — DX DG CHEST 2V
2 series · 2 of 2 positions shown · non-contrast
Comparison: 08/01/2015 and 07/10/2014

CLINICAL DATA: Chest pain.

EXAM:
CHEST  2 VIEW

[chest lat]
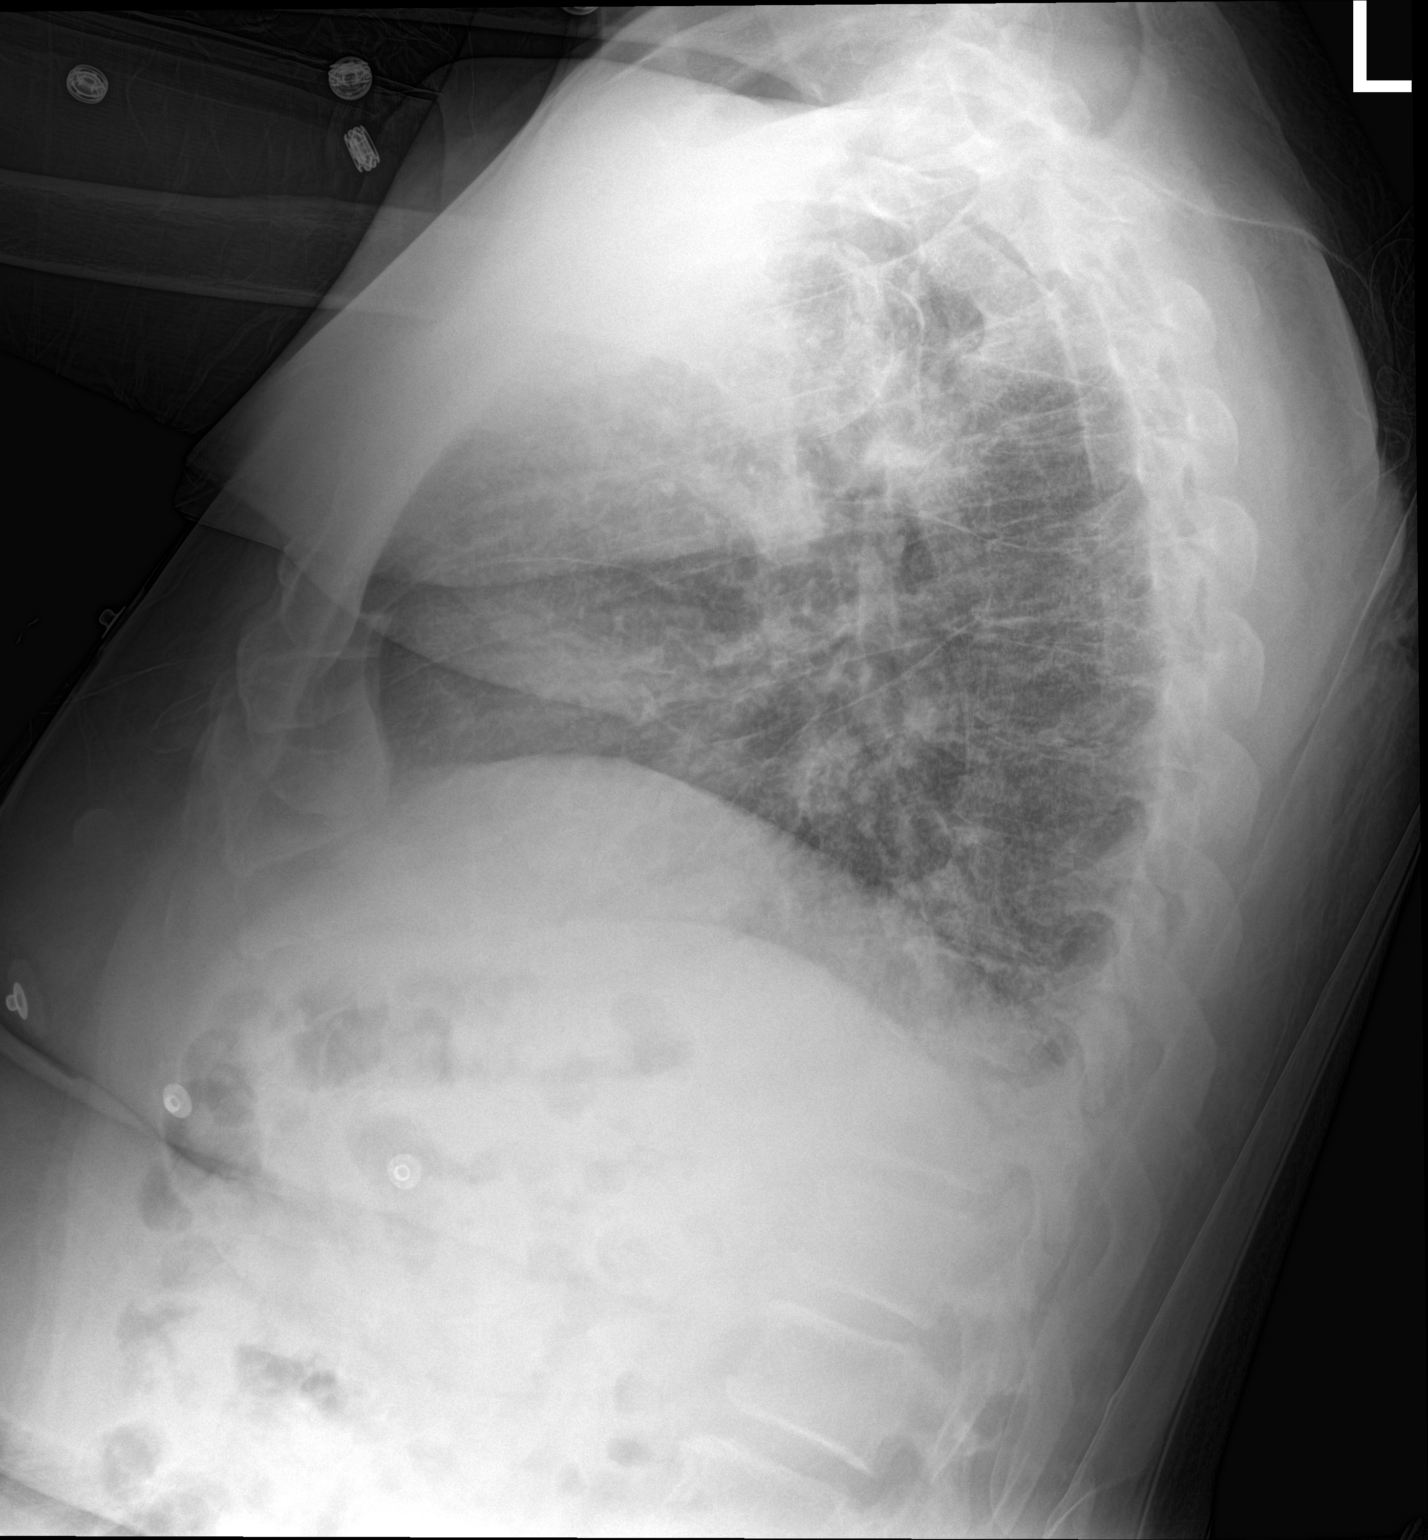

[chest ap]
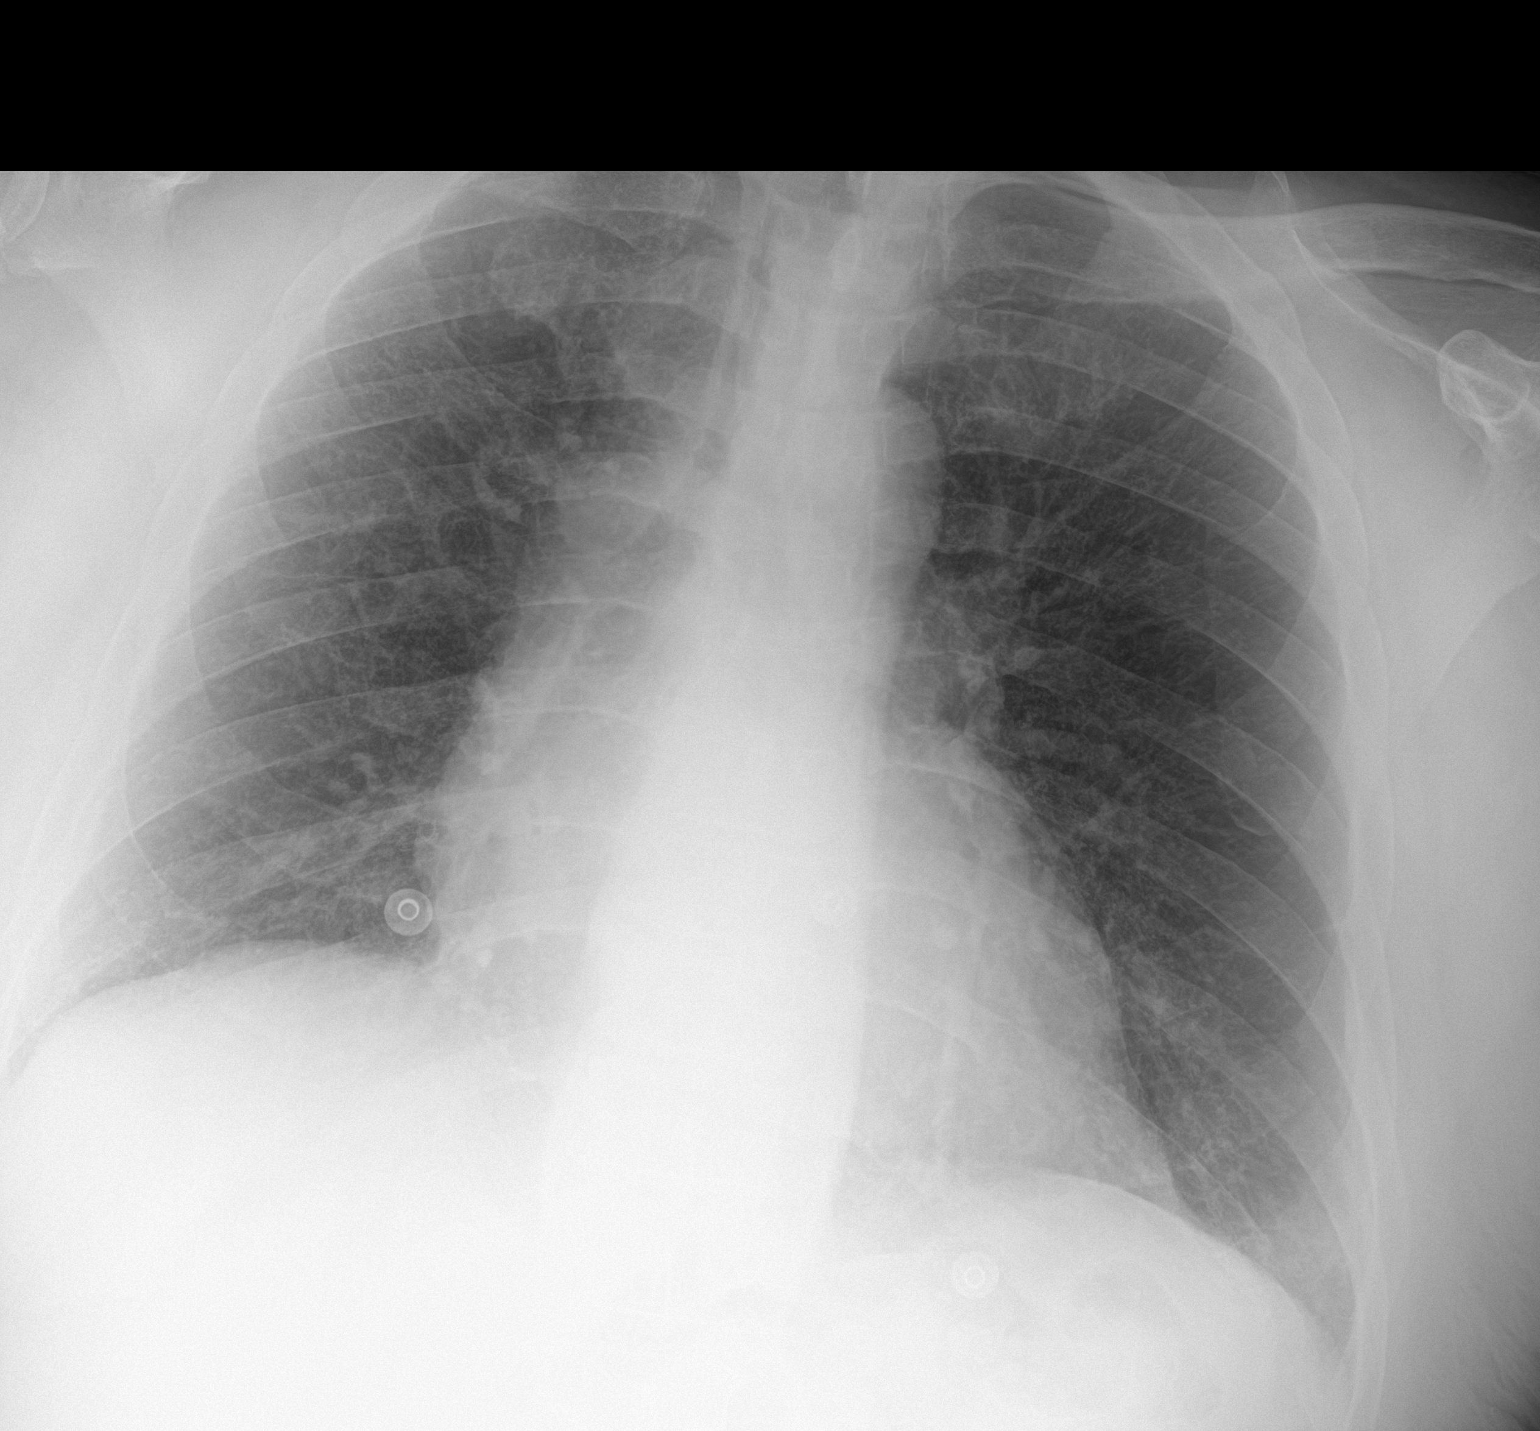

[2 of 2 positions shown; findings below may reference images not displayed]

FINDINGS: Heart size and pulmonary vascularity are normal. Slight chronic
accentuation of the interstitial markings. No acute infiltrates or
effusions.
IMPRESSION: No active cardiopulmonary disease.

## 2017-11-12 DIAGNOSIS — D689 Coagulation defect, unspecified: Secondary | ICD-10-CM | POA: Diagnosis not present

## 2017-11-12 DIAGNOSIS — E1129 Type 2 diabetes mellitus with other diabetic kidney complication: Secondary | ICD-10-CM | POA: Diagnosis not present

## 2017-11-12 DIAGNOSIS — N186 End stage renal disease: Secondary | ICD-10-CM | POA: Diagnosis not present

## 2017-11-12 DIAGNOSIS — N2581 Secondary hyperparathyroidism of renal origin: Secondary | ICD-10-CM | POA: Diagnosis not present

## 2017-11-15 DIAGNOSIS — E1129 Type 2 diabetes mellitus with other diabetic kidney complication: Secondary | ICD-10-CM | POA: Diagnosis not present

## 2017-11-15 DIAGNOSIS — N2581 Secondary hyperparathyroidism of renal origin: Secondary | ICD-10-CM | POA: Diagnosis not present

## 2017-11-15 DIAGNOSIS — N186 End stage renal disease: Secondary | ICD-10-CM | POA: Diagnosis not present

## 2017-11-15 DIAGNOSIS — D689 Coagulation defect, unspecified: Secondary | ICD-10-CM | POA: Diagnosis not present

## 2017-11-16 DIAGNOSIS — I10 Essential (primary) hypertension: Secondary | ICD-10-CM | POA: Diagnosis not present

## 2017-11-16 DIAGNOSIS — N186 End stage renal disease: Secondary | ICD-10-CM | POA: Diagnosis not present

## 2017-11-16 DIAGNOSIS — R29898 Other symptoms and signs involving the musculoskeletal system: Secondary | ICD-10-CM | POA: Diagnosis not present

## 2017-11-16 DIAGNOSIS — E1165 Type 2 diabetes mellitus with hyperglycemia: Secondary | ICD-10-CM | POA: Diagnosis not present

## 2017-11-16 DIAGNOSIS — E7800 Pure hypercholesterolemia, unspecified: Secondary | ICD-10-CM | POA: Diagnosis not present

## 2017-11-17 DIAGNOSIS — D689 Coagulation defect, unspecified: Secondary | ICD-10-CM | POA: Diagnosis not present

## 2017-11-17 DIAGNOSIS — N2581 Secondary hyperparathyroidism of renal origin: Secondary | ICD-10-CM | POA: Diagnosis not present

## 2017-11-17 DIAGNOSIS — E1129 Type 2 diabetes mellitus with other diabetic kidney complication: Secondary | ICD-10-CM | POA: Diagnosis not present

## 2017-11-17 DIAGNOSIS — N186 End stage renal disease: Secondary | ICD-10-CM | POA: Diagnosis not present

## 2017-11-19 DIAGNOSIS — N186 End stage renal disease: Secondary | ICD-10-CM | POA: Diagnosis not present

## 2017-11-19 DIAGNOSIS — E1129 Type 2 diabetes mellitus with other diabetic kidney complication: Secondary | ICD-10-CM | POA: Diagnosis not present

## 2017-11-19 DIAGNOSIS — D689 Coagulation defect, unspecified: Secondary | ICD-10-CM | POA: Diagnosis not present

## 2017-11-19 DIAGNOSIS — N2581 Secondary hyperparathyroidism of renal origin: Secondary | ICD-10-CM | POA: Diagnosis not present

## 2017-11-22 DIAGNOSIS — N2581 Secondary hyperparathyroidism of renal origin: Secondary | ICD-10-CM | POA: Diagnosis not present

## 2017-11-22 DIAGNOSIS — E1129 Type 2 diabetes mellitus with other diabetic kidney complication: Secondary | ICD-10-CM | POA: Diagnosis not present

## 2017-11-22 DIAGNOSIS — D689 Coagulation defect, unspecified: Secondary | ICD-10-CM | POA: Diagnosis not present

## 2017-11-22 DIAGNOSIS — N186 End stage renal disease: Secondary | ICD-10-CM | POA: Diagnosis not present

## 2017-11-24 DIAGNOSIS — E1129 Type 2 diabetes mellitus with other diabetic kidney complication: Secondary | ICD-10-CM | POA: Diagnosis not present

## 2017-11-24 DIAGNOSIS — N186 End stage renal disease: Secondary | ICD-10-CM | POA: Diagnosis not present

## 2017-11-24 DIAGNOSIS — D689 Coagulation defect, unspecified: Secondary | ICD-10-CM | POA: Diagnosis not present

## 2017-11-24 DIAGNOSIS — N2581 Secondary hyperparathyroidism of renal origin: Secondary | ICD-10-CM | POA: Diagnosis not present

## 2017-11-26 DIAGNOSIS — D689 Coagulation defect, unspecified: Secondary | ICD-10-CM | POA: Diagnosis not present

## 2017-11-26 DIAGNOSIS — E1129 Type 2 diabetes mellitus with other diabetic kidney complication: Secondary | ICD-10-CM | POA: Diagnosis not present

## 2017-11-26 DIAGNOSIS — N186 End stage renal disease: Secondary | ICD-10-CM | POA: Diagnosis not present

## 2017-11-26 DIAGNOSIS — N2581 Secondary hyperparathyroidism of renal origin: Secondary | ICD-10-CM | POA: Diagnosis not present

## 2017-11-29 DIAGNOSIS — E1129 Type 2 diabetes mellitus with other diabetic kidney complication: Secondary | ICD-10-CM | POA: Diagnosis not present

## 2017-11-29 DIAGNOSIS — N186 End stage renal disease: Secondary | ICD-10-CM | POA: Diagnosis not present

## 2017-11-29 DIAGNOSIS — N2581 Secondary hyperparathyroidism of renal origin: Secondary | ICD-10-CM | POA: Diagnosis not present

## 2017-11-29 DIAGNOSIS — D689 Coagulation defect, unspecified: Secondary | ICD-10-CM | POA: Diagnosis not present

## 2017-12-01 DIAGNOSIS — D689 Coagulation defect, unspecified: Secondary | ICD-10-CM | POA: Diagnosis not present

## 2017-12-01 DIAGNOSIS — N186 End stage renal disease: Secondary | ICD-10-CM | POA: Diagnosis not present

## 2017-12-01 DIAGNOSIS — E1129 Type 2 diabetes mellitus with other diabetic kidney complication: Secondary | ICD-10-CM | POA: Diagnosis not present

## 2017-12-01 DIAGNOSIS — N2581 Secondary hyperparathyroidism of renal origin: Secondary | ICD-10-CM | POA: Diagnosis not present

## 2017-12-03 DIAGNOSIS — N2581 Secondary hyperparathyroidism of renal origin: Secondary | ICD-10-CM | POA: Diagnosis not present

## 2017-12-03 DIAGNOSIS — E1129 Type 2 diabetes mellitus with other diabetic kidney complication: Secondary | ICD-10-CM | POA: Diagnosis not present

## 2017-12-03 DIAGNOSIS — N186 End stage renal disease: Secondary | ICD-10-CM | POA: Diagnosis not present

## 2017-12-03 DIAGNOSIS — D689 Coagulation defect, unspecified: Secondary | ICD-10-CM | POA: Diagnosis not present

## 2017-12-06 DIAGNOSIS — N2581 Secondary hyperparathyroidism of renal origin: Secondary | ICD-10-CM | POA: Diagnosis not present

## 2017-12-06 DIAGNOSIS — N186 End stage renal disease: Secondary | ICD-10-CM | POA: Diagnosis not present

## 2017-12-06 DIAGNOSIS — E1129 Type 2 diabetes mellitus with other diabetic kidney complication: Secondary | ICD-10-CM | POA: Diagnosis not present

## 2017-12-06 DIAGNOSIS — D689 Coagulation defect, unspecified: Secondary | ICD-10-CM | POA: Diagnosis not present

## 2017-12-08 DIAGNOSIS — E1129 Type 2 diabetes mellitus with other diabetic kidney complication: Secondary | ICD-10-CM | POA: Diagnosis not present

## 2017-12-08 DIAGNOSIS — N186 End stage renal disease: Secondary | ICD-10-CM | POA: Diagnosis not present

## 2017-12-08 DIAGNOSIS — D689 Coagulation defect, unspecified: Secondary | ICD-10-CM | POA: Diagnosis not present

## 2017-12-08 DIAGNOSIS — N2581 Secondary hyperparathyroidism of renal origin: Secondary | ICD-10-CM | POA: Diagnosis not present

## 2017-12-10 DIAGNOSIS — N186 End stage renal disease: Secondary | ICD-10-CM | POA: Diagnosis not present

## 2017-12-10 DIAGNOSIS — D689 Coagulation defect, unspecified: Secondary | ICD-10-CM | POA: Diagnosis not present

## 2017-12-10 DIAGNOSIS — E1129 Type 2 diabetes mellitus with other diabetic kidney complication: Secondary | ICD-10-CM | POA: Diagnosis not present

## 2017-12-10 DIAGNOSIS — N2581 Secondary hyperparathyroidism of renal origin: Secondary | ICD-10-CM | POA: Diagnosis not present

## 2017-12-12 DIAGNOSIS — N186 End stage renal disease: Secondary | ICD-10-CM | POA: Diagnosis not present

## 2017-12-12 DIAGNOSIS — N2581 Secondary hyperparathyroidism of renal origin: Secondary | ICD-10-CM | POA: Diagnosis not present

## 2017-12-12 DIAGNOSIS — Z992 Dependence on renal dialysis: Secondary | ICD-10-CM | POA: Diagnosis not present

## 2017-12-12 DIAGNOSIS — E1122 Type 2 diabetes mellitus with diabetic chronic kidney disease: Secondary | ICD-10-CM | POA: Diagnosis not present

## 2017-12-12 DIAGNOSIS — E1129 Type 2 diabetes mellitus with other diabetic kidney complication: Secondary | ICD-10-CM | POA: Diagnosis not present

## 2017-12-12 DIAGNOSIS — D689 Coagulation defect, unspecified: Secondary | ICD-10-CM | POA: Diagnosis not present

## 2017-12-13 DIAGNOSIS — N186 End stage renal disease: Secondary | ICD-10-CM | POA: Diagnosis not present

## 2017-12-13 DIAGNOSIS — N2581 Secondary hyperparathyroidism of renal origin: Secondary | ICD-10-CM | POA: Diagnosis not present

## 2017-12-13 DIAGNOSIS — E1129 Type 2 diabetes mellitus with other diabetic kidney complication: Secondary | ICD-10-CM | POA: Diagnosis not present

## 2017-12-13 DIAGNOSIS — D689 Coagulation defect, unspecified: Secondary | ICD-10-CM | POA: Diagnosis not present

## 2017-12-15 DIAGNOSIS — N2581 Secondary hyperparathyroidism of renal origin: Secondary | ICD-10-CM | POA: Diagnosis not present

## 2017-12-15 DIAGNOSIS — D689 Coagulation defect, unspecified: Secondary | ICD-10-CM | POA: Diagnosis not present

## 2017-12-15 DIAGNOSIS — E1129 Type 2 diabetes mellitus with other diabetic kidney complication: Secondary | ICD-10-CM | POA: Diagnosis not present

## 2017-12-15 DIAGNOSIS — N186 End stage renal disease: Secondary | ICD-10-CM | POA: Diagnosis not present

## 2017-12-17 DIAGNOSIS — N2581 Secondary hyperparathyroidism of renal origin: Secondary | ICD-10-CM | POA: Diagnosis not present

## 2017-12-17 DIAGNOSIS — D689 Coagulation defect, unspecified: Secondary | ICD-10-CM | POA: Diagnosis not present

## 2017-12-17 DIAGNOSIS — N186 End stage renal disease: Secondary | ICD-10-CM | POA: Diagnosis not present

## 2017-12-17 DIAGNOSIS — E1129 Type 2 diabetes mellitus with other diabetic kidney complication: Secondary | ICD-10-CM | POA: Diagnosis not present

## 2017-12-20 DIAGNOSIS — N2581 Secondary hyperparathyroidism of renal origin: Secondary | ICD-10-CM | POA: Diagnosis not present

## 2017-12-20 DIAGNOSIS — N186 End stage renal disease: Secondary | ICD-10-CM | POA: Diagnosis not present

## 2017-12-20 DIAGNOSIS — E1129 Type 2 diabetes mellitus with other diabetic kidney complication: Secondary | ICD-10-CM | POA: Diagnosis not present

## 2017-12-20 DIAGNOSIS — D689 Coagulation defect, unspecified: Secondary | ICD-10-CM | POA: Diagnosis not present

## 2017-12-22 DIAGNOSIS — N186 End stage renal disease: Secondary | ICD-10-CM | POA: Diagnosis not present

## 2017-12-22 DIAGNOSIS — D689 Coagulation defect, unspecified: Secondary | ICD-10-CM | POA: Diagnosis not present

## 2017-12-22 DIAGNOSIS — N2581 Secondary hyperparathyroidism of renal origin: Secondary | ICD-10-CM | POA: Diagnosis not present

## 2017-12-22 DIAGNOSIS — E1129 Type 2 diabetes mellitus with other diabetic kidney complication: Secondary | ICD-10-CM | POA: Diagnosis not present

## 2017-12-24 DIAGNOSIS — N186 End stage renal disease: Secondary | ICD-10-CM | POA: Diagnosis not present

## 2017-12-24 DIAGNOSIS — E1129 Type 2 diabetes mellitus with other diabetic kidney complication: Secondary | ICD-10-CM | POA: Diagnosis not present

## 2017-12-24 DIAGNOSIS — D689 Coagulation defect, unspecified: Secondary | ICD-10-CM | POA: Diagnosis not present

## 2017-12-24 DIAGNOSIS — N2581 Secondary hyperparathyroidism of renal origin: Secondary | ICD-10-CM | POA: Diagnosis not present

## 2017-12-27 DIAGNOSIS — E1129 Type 2 diabetes mellitus with other diabetic kidney complication: Secondary | ICD-10-CM | POA: Diagnosis not present

## 2017-12-27 DIAGNOSIS — N2581 Secondary hyperparathyroidism of renal origin: Secondary | ICD-10-CM | POA: Diagnosis not present

## 2017-12-27 DIAGNOSIS — D689 Coagulation defect, unspecified: Secondary | ICD-10-CM | POA: Diagnosis not present

## 2017-12-27 DIAGNOSIS — N186 End stage renal disease: Secondary | ICD-10-CM | POA: Diagnosis not present

## 2017-12-29 DIAGNOSIS — N186 End stage renal disease: Secondary | ICD-10-CM | POA: Diagnosis not present

## 2017-12-29 DIAGNOSIS — E1129 Type 2 diabetes mellitus with other diabetic kidney complication: Secondary | ICD-10-CM | POA: Diagnosis not present

## 2017-12-29 DIAGNOSIS — N2581 Secondary hyperparathyroidism of renal origin: Secondary | ICD-10-CM | POA: Diagnosis not present

## 2017-12-29 DIAGNOSIS — D689 Coagulation defect, unspecified: Secondary | ICD-10-CM | POA: Diagnosis not present

## 2017-12-31 DIAGNOSIS — E1129 Type 2 diabetes mellitus with other diabetic kidney complication: Secondary | ICD-10-CM | POA: Diagnosis not present

## 2017-12-31 DIAGNOSIS — N2581 Secondary hyperparathyroidism of renal origin: Secondary | ICD-10-CM | POA: Diagnosis not present

## 2017-12-31 DIAGNOSIS — N186 End stage renal disease: Secondary | ICD-10-CM | POA: Diagnosis not present

## 2017-12-31 DIAGNOSIS — D689 Coagulation defect, unspecified: Secondary | ICD-10-CM | POA: Diagnosis not present

## 2018-01-03 DIAGNOSIS — N186 End stage renal disease: Secondary | ICD-10-CM | POA: Diagnosis not present

## 2018-01-03 DIAGNOSIS — D689 Coagulation defect, unspecified: Secondary | ICD-10-CM | POA: Diagnosis not present

## 2018-01-03 DIAGNOSIS — N2581 Secondary hyperparathyroidism of renal origin: Secondary | ICD-10-CM | POA: Diagnosis not present

## 2018-01-03 DIAGNOSIS — E1129 Type 2 diabetes mellitus with other diabetic kidney complication: Secondary | ICD-10-CM | POA: Diagnosis not present

## 2018-01-05 DIAGNOSIS — D689 Coagulation defect, unspecified: Secondary | ICD-10-CM | POA: Diagnosis not present

## 2018-01-05 DIAGNOSIS — N186 End stage renal disease: Secondary | ICD-10-CM | POA: Diagnosis not present

## 2018-01-05 DIAGNOSIS — N2581 Secondary hyperparathyroidism of renal origin: Secondary | ICD-10-CM | POA: Diagnosis not present

## 2018-01-05 DIAGNOSIS — E1129 Type 2 diabetes mellitus with other diabetic kidney complication: Secondary | ICD-10-CM | POA: Diagnosis not present

## 2018-01-07 DIAGNOSIS — N2581 Secondary hyperparathyroidism of renal origin: Secondary | ICD-10-CM | POA: Diagnosis not present

## 2018-01-07 DIAGNOSIS — N186 End stage renal disease: Secondary | ICD-10-CM | POA: Diagnosis not present

## 2018-01-07 DIAGNOSIS — D689 Coagulation defect, unspecified: Secondary | ICD-10-CM | POA: Diagnosis not present

## 2018-01-07 DIAGNOSIS — E1129 Type 2 diabetes mellitus with other diabetic kidney complication: Secondary | ICD-10-CM | POA: Diagnosis not present

## 2018-01-10 DIAGNOSIS — N2581 Secondary hyperparathyroidism of renal origin: Secondary | ICD-10-CM | POA: Diagnosis not present

## 2018-01-10 DIAGNOSIS — N186 End stage renal disease: Secondary | ICD-10-CM | POA: Diagnosis not present

## 2018-01-10 DIAGNOSIS — E1129 Type 2 diabetes mellitus with other diabetic kidney complication: Secondary | ICD-10-CM | POA: Diagnosis not present

## 2018-01-10 DIAGNOSIS — D689 Coagulation defect, unspecified: Secondary | ICD-10-CM | POA: Diagnosis not present

## 2018-01-12 DIAGNOSIS — N2581 Secondary hyperparathyroidism of renal origin: Secondary | ICD-10-CM | POA: Diagnosis not present

## 2018-01-12 DIAGNOSIS — E1129 Type 2 diabetes mellitus with other diabetic kidney complication: Secondary | ICD-10-CM | POA: Diagnosis not present

## 2018-01-12 DIAGNOSIS — D631 Anemia in chronic kidney disease: Secondary | ICD-10-CM | POA: Diagnosis not present

## 2018-01-12 DIAGNOSIS — N186 End stage renal disease: Secondary | ICD-10-CM | POA: Diagnosis not present

## 2018-01-12 DIAGNOSIS — D689 Coagulation defect, unspecified: Secondary | ICD-10-CM | POA: Diagnosis not present

## 2018-01-14 DIAGNOSIS — D689 Coagulation defect, unspecified: Secondary | ICD-10-CM | POA: Diagnosis not present

## 2018-01-14 DIAGNOSIS — E1129 Type 2 diabetes mellitus with other diabetic kidney complication: Secondary | ICD-10-CM | POA: Diagnosis not present

## 2018-01-14 DIAGNOSIS — N2581 Secondary hyperparathyroidism of renal origin: Secondary | ICD-10-CM | POA: Diagnosis not present

## 2018-01-14 DIAGNOSIS — D631 Anemia in chronic kidney disease: Secondary | ICD-10-CM | POA: Diagnosis not present

## 2018-01-14 DIAGNOSIS — N186 End stage renal disease: Secondary | ICD-10-CM | POA: Diagnosis not present

## 2018-01-17 DIAGNOSIS — N2581 Secondary hyperparathyroidism of renal origin: Secondary | ICD-10-CM | POA: Diagnosis not present

## 2018-01-17 DIAGNOSIS — D689 Coagulation defect, unspecified: Secondary | ICD-10-CM | POA: Diagnosis not present

## 2018-01-17 DIAGNOSIS — E1129 Type 2 diabetes mellitus with other diabetic kidney complication: Secondary | ICD-10-CM | POA: Diagnosis not present

## 2018-01-17 DIAGNOSIS — D631 Anemia in chronic kidney disease: Secondary | ICD-10-CM | POA: Diagnosis not present

## 2018-01-17 DIAGNOSIS — N186 End stage renal disease: Secondary | ICD-10-CM | POA: Diagnosis not present

## 2018-01-21 DIAGNOSIS — D689 Coagulation defect, unspecified: Secondary | ICD-10-CM | POA: Diagnosis not present

## 2018-01-21 DIAGNOSIS — E1129 Type 2 diabetes mellitus with other diabetic kidney complication: Secondary | ICD-10-CM | POA: Diagnosis not present

## 2018-01-21 DIAGNOSIS — D631 Anemia in chronic kidney disease: Secondary | ICD-10-CM | POA: Diagnosis not present

## 2018-01-21 DIAGNOSIS — N186 End stage renal disease: Secondary | ICD-10-CM | POA: Diagnosis not present

## 2018-01-21 DIAGNOSIS — N2581 Secondary hyperparathyroidism of renal origin: Secondary | ICD-10-CM | POA: Diagnosis not present

## 2018-01-24 DIAGNOSIS — D631 Anemia in chronic kidney disease: Secondary | ICD-10-CM | POA: Diagnosis not present

## 2018-01-24 DIAGNOSIS — D689 Coagulation defect, unspecified: Secondary | ICD-10-CM | POA: Diagnosis not present

## 2018-01-24 DIAGNOSIS — N2581 Secondary hyperparathyroidism of renal origin: Secondary | ICD-10-CM | POA: Diagnosis not present

## 2018-01-24 DIAGNOSIS — E1129 Type 2 diabetes mellitus with other diabetic kidney complication: Secondary | ICD-10-CM | POA: Diagnosis not present

## 2018-01-24 DIAGNOSIS — N186 End stage renal disease: Secondary | ICD-10-CM | POA: Diagnosis not present

## 2018-01-26 DIAGNOSIS — D631 Anemia in chronic kidney disease: Secondary | ICD-10-CM | POA: Diagnosis not present

## 2018-01-26 DIAGNOSIS — N186 End stage renal disease: Secondary | ICD-10-CM | POA: Diagnosis not present

## 2018-01-26 DIAGNOSIS — D689 Coagulation defect, unspecified: Secondary | ICD-10-CM | POA: Diagnosis not present

## 2018-01-26 DIAGNOSIS — N2581 Secondary hyperparathyroidism of renal origin: Secondary | ICD-10-CM | POA: Diagnosis not present

## 2018-01-26 DIAGNOSIS — E1129 Type 2 diabetes mellitus with other diabetic kidney complication: Secondary | ICD-10-CM | POA: Diagnosis not present

## 2018-01-28 DIAGNOSIS — N2581 Secondary hyperparathyroidism of renal origin: Secondary | ICD-10-CM | POA: Diagnosis not present

## 2018-01-28 DIAGNOSIS — D689 Coagulation defect, unspecified: Secondary | ICD-10-CM | POA: Diagnosis not present

## 2018-01-28 DIAGNOSIS — E1129 Type 2 diabetes mellitus with other diabetic kidney complication: Secondary | ICD-10-CM | POA: Diagnosis not present

## 2018-01-28 DIAGNOSIS — N186 End stage renal disease: Secondary | ICD-10-CM | POA: Diagnosis not present

## 2018-01-28 DIAGNOSIS — D631 Anemia in chronic kidney disease: Secondary | ICD-10-CM | POA: Diagnosis not present

## 2018-01-31 DIAGNOSIS — E1129 Type 2 diabetes mellitus with other diabetic kidney complication: Secondary | ICD-10-CM | POA: Diagnosis not present

## 2018-01-31 DIAGNOSIS — N186 End stage renal disease: Secondary | ICD-10-CM | POA: Diagnosis not present

## 2018-01-31 DIAGNOSIS — D631 Anemia in chronic kidney disease: Secondary | ICD-10-CM | POA: Diagnosis not present

## 2018-01-31 DIAGNOSIS — D689 Coagulation defect, unspecified: Secondary | ICD-10-CM | POA: Diagnosis not present

## 2018-01-31 DIAGNOSIS — N2581 Secondary hyperparathyroidism of renal origin: Secondary | ICD-10-CM | POA: Diagnosis not present

## 2018-02-02 DIAGNOSIS — D689 Coagulation defect, unspecified: Secondary | ICD-10-CM | POA: Diagnosis not present

## 2018-02-02 DIAGNOSIS — D631 Anemia in chronic kidney disease: Secondary | ICD-10-CM | POA: Diagnosis not present

## 2018-02-02 DIAGNOSIS — N2581 Secondary hyperparathyroidism of renal origin: Secondary | ICD-10-CM | POA: Diagnosis not present

## 2018-02-02 DIAGNOSIS — E1129 Type 2 diabetes mellitus with other diabetic kidney complication: Secondary | ICD-10-CM | POA: Diagnosis not present

## 2018-02-02 DIAGNOSIS — N186 End stage renal disease: Secondary | ICD-10-CM | POA: Diagnosis not present

## 2018-02-04 DIAGNOSIS — N186 End stage renal disease: Secondary | ICD-10-CM | POA: Diagnosis not present

## 2018-02-04 DIAGNOSIS — D689 Coagulation defect, unspecified: Secondary | ICD-10-CM | POA: Diagnosis not present

## 2018-02-04 DIAGNOSIS — E1129 Type 2 diabetes mellitus with other diabetic kidney complication: Secondary | ICD-10-CM | POA: Diagnosis not present

## 2018-02-04 DIAGNOSIS — N2581 Secondary hyperparathyroidism of renal origin: Secondary | ICD-10-CM | POA: Diagnosis not present

## 2018-02-04 DIAGNOSIS — D631 Anemia in chronic kidney disease: Secondary | ICD-10-CM | POA: Diagnosis not present

## 2018-02-07 ENCOUNTER — Emergency Department (HOSPITAL_COMMUNITY): Payer: Medicare Other

## 2018-02-07 ENCOUNTER — Inpatient Hospital Stay (HOSPITAL_COMMUNITY)
Admission: EM | Admit: 2018-02-07 | Discharge: 2018-02-13 | DRG: 871 | Disposition: A | Discharge status: Skilled Nursing Facility | Payer: Medicare Other | Attending: Internal Medicine | Admitting: Internal Medicine

## 2018-02-07 DIAGNOSIS — Z87891 Personal history of nicotine dependence: Secondary | ICD-10-CM

## 2018-02-07 DIAGNOSIS — G9341 Metabolic encephalopathy: Secondary | ICD-10-CM | POA: Diagnosis present

## 2018-02-07 DIAGNOSIS — Z833 Family history of diabetes mellitus: Secondary | ICD-10-CM

## 2018-02-07 DIAGNOSIS — D509 Iron deficiency anemia, unspecified: Secondary | ICD-10-CM | POA: Diagnosis not present

## 2018-02-07 DIAGNOSIS — N400 Enlarged prostate without lower urinary tract symptoms: Secondary | ICD-10-CM | POA: Diagnosis present

## 2018-02-07 DIAGNOSIS — K219 Gastro-esophageal reflux disease without esophagitis: Secondary | ICD-10-CM | POA: Diagnosis not present

## 2018-02-07 DIAGNOSIS — J9601 Acute respiratory failure with hypoxia: Secondary | ICD-10-CM | POA: Diagnosis present

## 2018-02-07 DIAGNOSIS — D689 Coagulation defect, unspecified: Secondary | ICD-10-CM | POA: Diagnosis not present

## 2018-02-07 DIAGNOSIS — E1129 Type 2 diabetes mellitus with other diabetic kidney complication: Secondary | ICD-10-CM | POA: Diagnosis present

## 2018-02-07 DIAGNOSIS — R112 Nausea with vomiting, unspecified: Secondary | ICD-10-CM | POA: Diagnosis present

## 2018-02-07 DIAGNOSIS — I132 Hypertensive heart and chronic kidney disease with heart failure and with stage 5 chronic kidney disease, or end stage renal disease: Secondary | ICD-10-CM | POA: Diagnosis present

## 2018-02-07 DIAGNOSIS — R0902 Hypoxemia: Secondary | ICD-10-CM | POA: Diagnosis not present

## 2018-02-07 DIAGNOSIS — N2581 Secondary hyperparathyroidism of renal origin: Secondary | ICD-10-CM | POA: Diagnosis present

## 2018-02-07 DIAGNOSIS — D631 Anemia in chronic kidney disease: Secondary | ICD-10-CM | POA: Diagnosis present

## 2018-02-07 DIAGNOSIS — I5032 Chronic diastolic (congestive) heart failure: Secondary | ICD-10-CM | POA: Diagnosis present

## 2018-02-07 DIAGNOSIS — A419 Sepsis, unspecified organism: Secondary | ICD-10-CM | POA: Diagnosis not present

## 2018-02-07 DIAGNOSIS — N281 Cyst of kidney, acquired: Secondary | ICD-10-CM | POA: Diagnosis not present

## 2018-02-07 DIAGNOSIS — D649 Anemia, unspecified: Secondary | ICD-10-CM | POA: Diagnosis present

## 2018-02-07 DIAGNOSIS — Z7982 Long term (current) use of aspirin: Secondary | ICD-10-CM

## 2018-02-07 DIAGNOSIS — Z8249 Family history of ischemic heart disease and other diseases of the circulatory system: Secondary | ICD-10-CM | POA: Diagnosis not present

## 2018-02-07 DIAGNOSIS — I2510 Atherosclerotic heart disease of native coronary artery without angina pectoris: Secondary | ICD-10-CM | POA: Diagnosis not present

## 2018-02-07 DIAGNOSIS — E1122 Type 2 diabetes mellitus with diabetic chronic kidney disease: Secondary | ICD-10-CM

## 2018-02-07 DIAGNOSIS — E876 Hypokalemia: Secondary | ICD-10-CM | POA: Diagnosis not present

## 2018-02-07 DIAGNOSIS — R918 Other nonspecific abnormal finding of lung field: Secondary | ICD-10-CM | POA: Diagnosis not present

## 2018-02-07 DIAGNOSIS — E785 Hyperlipidemia, unspecified: Secondary | ICD-10-CM | POA: Diagnosis present

## 2018-02-07 DIAGNOSIS — J189 Pneumonia, unspecified organism: Secondary | ICD-10-CM | POA: Diagnosis not present

## 2018-02-07 DIAGNOSIS — Z89421 Acquired absence of other right toe(s): Secondary | ICD-10-CM

## 2018-02-07 DIAGNOSIS — F32A Depression, unspecified: Secondary | ICD-10-CM | POA: Diagnosis present

## 2018-02-07 DIAGNOSIS — Z794 Long term (current) use of insulin: Secondary | ICD-10-CM | POA: Diagnosis not present

## 2018-02-07 DIAGNOSIS — R42 Dizziness and giddiness: Secondary | ICD-10-CM | POA: Diagnosis not present

## 2018-02-07 DIAGNOSIS — N186 End stage renal disease: Secondary | ICD-10-CM | POA: Diagnosis not present

## 2018-02-07 DIAGNOSIS — Z992 Dependence on renal dialysis: Secondary | ICD-10-CM

## 2018-02-07 DIAGNOSIS — G92 Toxic encephalopathy: Secondary | ICD-10-CM | POA: Diagnosis not present

## 2018-02-07 DIAGNOSIS — F329 Major depressive disorder, single episode, unspecified: Secondary | ICD-10-CM | POA: Diagnosis present

## 2018-02-07 DIAGNOSIS — D696 Thrombocytopenia, unspecified: Secondary | ICD-10-CM | POA: Diagnosis not present

## 2018-02-07 HISTORY — DX: End stage renal disease: N18.6

## 2018-02-07 HISTORY — DX: Type 2 diabetes mellitus without complications: E11.9

## 2018-02-07 HISTORY — DX: Unspecified osteoarthritis, unspecified site: M19.90

## 2018-02-07 HISTORY — DX: Dependence on renal dialysis: Z99.2

## 2018-02-07 LAB — I-STAT VENOUS BLOOD GAS, ED
ACID-BASE DEFICIT: 11 mmol/L — AB (ref 0.0–2.0)
Bicarbonate: 14.9 mmol/L — ABNORMAL LOW (ref 20.0–28.0)
O2 SAT: 75 %
PO2 VEN: 43 mmHg (ref 32.0–45.0)
TCO2: 16 mmol/L — AB (ref 22–32)
pCO2, Ven: 30.6 mmHg — ABNORMAL LOW (ref 44.0–60.0)
pH, Ven: 7.294 (ref 7.250–7.430)

## 2018-02-07 LAB — I-STAT TROPONIN, ED: TROPONIN I, POC: 0.02 ng/mL (ref 0.00–0.08)

## 2018-02-07 LAB — CBC WITH DIFFERENTIAL/PLATELET
BASOS ABS: 0 10*3/uL (ref 0.0–0.1)
Basophils Relative: 0 %
EOS ABS: 0 10*3/uL (ref 0.0–0.7)
Eosinophils Relative: 0 %
HCT: 26.5 % — ABNORMAL LOW (ref 39.0–52.0)
HEMOGLOBIN: 8.2 g/dL — AB (ref 13.0–17.0)
LYMPHS ABS: 0.2 10*3/uL — AB (ref 0.7–4.0)
Lymphocytes Relative: 3 %
MCH: 29.3 pg (ref 26.0–34.0)
MCHC: 30.9 g/dL (ref 30.0–36.0)
MCV: 94.6 fL (ref 78.0–100.0)
MONO ABS: 0.9 10*3/uL (ref 0.1–1.0)
MONOS PCT: 13 %
Neutro Abs: 6.1 10*3/uL (ref 1.7–7.7)
Neutrophils Relative %: 84 %
PLATELETS: 108 10*3/uL — AB (ref 150–400)
RBC: 2.8 MIL/uL — ABNORMAL LOW (ref 4.22–5.81)
RDW: 13.7 % (ref 11.5–15.5)
WBC: 7.2 10*3/uL (ref 4.0–10.5)

## 2018-02-07 LAB — POC OCCULT BLOOD, ED: Fecal Occult Bld: NEGATIVE

## 2018-02-07 LAB — LIPASE, BLOOD: LIPASE: 25 U/L (ref 11–51)

## 2018-02-07 LAB — I-STAT CG4 LACTIC ACID, ED: Lactic Acid, Venous: 3.13 mmol/L (ref 0.5–1.9)

## 2018-02-07 LAB — CBG MONITORING, ED: GLUCOSE-CAPILLARY: 204 mg/dL — AB (ref 65–99)

## 2018-02-07 LAB — PROTIME-INR
INR: 1.47
PROTHROMBIN TIME: 17.7 s — AB (ref 11.4–15.2)

## 2018-02-07 LAB — TSH: TSH: 0.706 u[IU]/mL (ref 0.350–4.500)

## 2018-02-07 LAB — AMMONIA: Ammonia: 9 umol/L (ref 9–35)

## 2018-02-07 MED ORDER — INSULIN ASPART 100 UNIT/ML ~~LOC~~ SOLN
0.0000 [IU] | Freq: Three times a day (TID) | SUBCUTANEOUS | Status: DC
  Administered 2018-02-09: 1 [IU] via SUBCUTANEOUS
  Administered 2018-02-10: 2 [IU] via SUBCUTANEOUS
  Administered 2018-02-10: 5 [IU] via SUBCUTANEOUS
  Administered 2018-02-10 – 2018-02-12 (×5): 2 [IU] via SUBCUTANEOUS
  Administered 2018-02-13: 1 [IU] via SUBCUTANEOUS
  Administered 2018-02-13: 2 [IU] via SUBCUTANEOUS

## 2018-02-07 MED ORDER — SODIUM CHLORIDE 0.9 % IV BOLUS
1000.0000 mL | Freq: Once | INTRAVENOUS | Status: AC
  Administered 2018-02-07: 1000 mL via INTRAVENOUS

## 2018-02-07 MED ORDER — POTASSIUM CHLORIDE 10 MEQ/100ML IV SOLN: 10.0000 meq | INTRAVENOUS | Status: DC

## 2018-02-07 MED ORDER — FAMOTIDINE IN NACL 20-0.9 MG/50ML-% IV SOLN
20.0000 mg | Freq: Two times a day (BID) | INTRAVENOUS | Status: DC
  Administered 2018-02-08 – 2018-02-09 (×4): 20 mg via INTRAVENOUS
  Filled 2018-02-07 (×4): qty 50

## 2018-02-07 MED ORDER — SODIUM CHLORIDE 0.9 % IV SOLN
2.0000 g | Freq: Once | INTRAVENOUS | Status: DC
  Filled 2018-02-07: qty 20

## 2018-02-07 MED ORDER — INSULIN ASPART 100 UNIT/ML ~~LOC~~ SOLN
0.0000 [IU] | Freq: Every day | SUBCUTANEOUS | Status: DC
  Administered 2018-02-11: 2 [IU] via SUBCUTANEOUS

## 2018-02-07 MED ORDER — SODIUM CHLORIDE 0.9 % IV SOLN
1.0000 g | Freq: Once | INTRAVENOUS | Status: AC
  Administered 2018-02-07: 1 g via INTRAVENOUS
  Filled 2018-02-07: qty 10

## 2018-02-07 MED ORDER — ONDANSETRON HCL 4 MG/2ML IJ SOLN: 4.0000 mg | Freq: Three times a day (TID) | INTRAMUSCULAR | Status: DC | PRN

## 2018-02-07 MED ORDER — SODIUM CHLORIDE 0.9 % IV SOLN
INTRAVENOUS | Status: DC
  Administered 2018-02-08: via INTRAVENOUS

## 2018-02-07 MED ORDER — ACETAMINOPHEN 650 MG RE SUPP: 650.0000 mg | Freq: Four times a day (QID) | RECTAL | Status: DC | PRN

## 2018-02-07 MED ORDER — VANCOMYCIN HCL IN DEXTROSE 1-5 GM/200ML-% IV SOLN
1000.0000 mg | Freq: Once | INTRAVENOUS | Status: DC
  Filled 2018-02-07: qty 200

## 2018-02-07 MED ORDER — PIPERACILLIN-TAZOBACTAM 3.375 G IVPB
3.3750 g | Freq: Two times a day (BID) | INTRAVENOUS | Status: DC
  Administered 2018-02-08 – 2018-02-11 (×6): 3.375 g via INTRAVENOUS
  Filled 2018-02-07 (×8): qty 50

## 2018-02-07 MED ORDER — POTASSIUM CHLORIDE CRYS ER 20 MEQ PO TBCR: 20.0000 meq | EXTENDED_RELEASE_TABLET | Freq: Once | ORAL | Status: DC

## 2018-02-07 MED ORDER — HYDRALAZINE HCL 20 MG/ML IJ SOLN: 5.0000 mg | INTRAMUSCULAR | Status: DC | PRN

## 2018-02-07 MED ORDER — POTASSIUM CHLORIDE 10 MEQ/100ML IV SOLN
10.0000 meq | Freq: Once | INTRAVENOUS | Status: AC
  Administered 2018-02-07: 10 meq via INTRAVENOUS
  Filled 2018-02-07: qty 100

## 2018-02-07 MED ORDER — PIPERACILLIN-TAZOBACTAM 3.375 G IVPB 30 MIN
3.3750 g | Freq: Once | INTRAVENOUS | Status: AC
  Administered 2018-02-07: 3.375 g via INTRAVENOUS
  Filled 2018-02-07: qty 50

## 2018-02-07 MED ORDER — VANCOMYCIN HCL 10 G IV SOLR
1500.0000 mg | Freq: Once | INTRAVENOUS | Status: AC
  Administered 2018-02-07: 1500 mg via INTRAVENOUS
  Filled 2018-02-07: qty 1500

## 2018-02-07 NOTE — ED Provider Notes (Signed)
[facility] EMERGENCY DEPARTMENT Provider Note   CSN: [id number] Arrival date & time: 02/07/18  2121     History   Chief Complaint Chief Complaint  Patient presents with  . Weakness    HPI Blake [name] is a 72 y.o. male.  The history is provided by the patient and medical records. No language interpreter was used.  Altered Mental Status   This is a new problem. The problem has not changed since onset.Associated symptoms include somnolence. Pertinent negatives include no agitation. His past medical history is significant for diabetes and hypertension.  Emesis   This is a new problem. The problem has not changed since onset.The emesis has an appearance of stomach contents. Associated symptoms include chills. Pertinent negatives include no abdominal pain, no cough, no diarrhea, no fever and no headaches.    Past Medical History:  Diagnosis Date  . Acute osteomyelitis of toe of right foot ([place]) 08/02/2015  . Diabetes mellitus without complication ([place])   . Hyperlipidemia   . Hypertension   . Renal disorder     Patient Active Problem List   Diagnosis Date Noted  . Nausea and vomiting 04/21/2016  . Noncompliance with renal dialysis ([place]) 04/21/2016  . Hyperkalemia, diminished renal excretion 04/21/2016  . Nausea & vomiting 04/21/2016  . Encephalopathy, metabolic 87/56/4332  . Acute osteomyelitis of toe of right foot ([place]) 08/02/2015  . Ulcer of toe of right foot ([place]) 08/01/2015  . Hypothermia 08/01/2015  . Fall 08/01/2015  . Hypoglycemia associated with diabetes ([place]) 08/01/2015  . Occasional numbness/prickling/tingling of fingers  08/23/2014  . 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 Acute pulmonary edema ([name])   . 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 Depression 07/07/2014  . Neuropathic pain, leg, bilateral 07/07/2014  . Diabetic retinopathy ([place]) 07/07/2014  . Advance directive discussed with patient 07/07/2014  . Acute  respiratory failure ([place]) 06/28/2014  . Scrotal pain 10/19/2012  . 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 Proteinuria 10/17/2012  . Hypoxia 10/17/2012  . Hypertension     Past Surgical History:  Procedure Laterality Date  . AMPUTATION TOE Right 08/04/2015   Procedure: RIGHT GREAT TOE AMPUTATION;  Surgeon: [name], MD;  Location: [facility];  Service: Orthopedics;  Laterality: Right;  . AV FISTULA PLACEMENT Right 07/05/2014   Procedure: ARTERIOVENOUS BRACHIALCEPHALIC (AV) FISTULA CREATION;  Surgeon: Conrad Stanberry, MD;  Location: [facility];  Service: Vascular;  Laterality: Right;  . BACK SURGERY    . ESOPHAGOGASTRODUODENOSCOPY (EGD) WITH PROPOFOL N/A 11/28/2015   Procedure: ESOPHAGOGASTRODUODENOSCOPY (EGD) WITH PROPOFOL;  Surgeon: [name], MD;  Location: [facility];  Service: Endoscopy;  Laterality: N/A;  . INSERTION OF DIALYSIS CATHETER Left 07/05/2014   Procedure: INSERTION OF DIALYSIS CATHETER-LEFT INTERNAL JUGULAR PLACEMENT;  Surgeon: Conrad [place], MD;  Location: [facility];  Service: Vascular;  Laterality: Left;  . REMOVAL OF A DIALYSIS CATHETER Right 07/05/2014   Procedure: REMOVAL OF A DIALYSIS CATHETER;  Surgeon: Conrad Glidden, MD;  Location: [facility];  Service: Vascular;  Laterality: Right;        Home Medications    Prior to Admission medications   Medication Sig Start Date End Date Taking? Authorizing Provider  aspirin EC 81 MG tablet Take 81 mg by mouth daily.    [provider]  atorvastatin (LIPITOR) 20 MG tablet Take 20 mg by mouth daily.    [provider]  bisacodyl (DULCOLAX) 10 MG  suppository Place 1 suppository (10 mg total) rectally daily as needed for moderate constipation. 04/22/16   Mikhail, Velta Addison, DO  buPROPion (WELLBUTRIN XL) 150 MG 24 hr tablet Take 300 mg by mouth daily.    [provider]  DULoxetine (CYMBALTA) 20 MG capsule Take 1 capsule (20 mg total) by mouth daily. Patient taking differently: Take 20 mg by mouth daily  after supper.  07/09/14   Elgergawy, Silver Huguenin, MD  multivitamin (RENA-VIT) TABS tablet Take 1 tablet by mouth daily.    [provider]  NOVOLIN R RELION 100 UNIT/ML injection Inject 3-13 Units into the skin 3 (three) times daily with meals as needed for high blood sugar.  06/13/14   [provider]  pantoprazole (PROTONIX) 20 MG tablet Take 1 tablet (20 mg total) by mouth daily. 10/25/15   Patel-Mills, Hanna, PA-C  SENSIPAR 30 MG tablet Take 1 tablet by mouth daily. 07/23/15   [provider]  sevelamer carbonate (RENVELA) 800 MG tablet Take 1 tablet (800 mg total) by mouth 3 (three) times daily with meals. 08/23/14   Conrad Hays, MD    Family History Family History  Problem Relation Age of Onset  . Diabetes Father   . Heart attack Father     Social History Social History   Tobacco Use  . Smoking status: Former Smoker    Years: 5.00    Types: Cigarettes    Last attempt to quit: 08/24/1979    Years since quitting: 38.4  . Smokeless tobacco: Never Used  Substance Use Topics  . Alcohol use: No    Alcohol/week: 0.0 oz  . Drug use: No     Allergies   Patient has no known allergies.   Review of Systems Review of Systems  Unable to perform ROS: Mental status change  Constitutional: Positive for chills and fatigue. Negative for fever.  HENT: Negative for congestion.   Respiratory: Negative for cough, chest tightness, shortness of breath and wheezing.   Cardiovascular: Negative for chest pain.  Gastrointestinal: Positive for nausea and vomiting. Negative for abdominal pain, constipation and diarrhea.  Genitourinary: Negative for dysuria, flank pain and frequency.  Musculoskeletal: Negative for back pain, neck pain and neck stiffness.  Skin: Negative for wound.  Neurological: Positive for light-headedness. Negative for dizziness and headaches.  Psychiatric/Behavioral: Negative for agitation.  All other systems reviewed and are  negative.    Physical Exam Updated Vital Signs BP (!) 93/52 (BP Location: Left Arm)   Pulse 71   Temp 99.5 F (37.5 C) (Axillary)   Resp (!) 21   Ht 5\' 8"  (1.727 m)   Wt 88.5 kg (195 lb)   SpO2 97%   BMI 29.65 kg/m   Physical Exam  Constitutional: He is oriented to person, place, and time. Vital signs are normal. He appears well-nourished.  HENT:  Head: Normocephalic.  Mouth/Throat: Oropharynx is clear and moist. No oropharyngeal exudate.  Eyes: Pupils are equal, round, and reactive to light. EOM are normal.  Neck: Neck supple.  Cardiovascular: Normal rate, regular rhythm and intact distal pulses.  Murmur heard. Pulmonary/Chest: Tachypnea noted. No respiratory distress. He has rhonchi. He has rales. He exhibits no tenderness.  Abdominal: Soft. He exhibits no distension. There is no tenderness.  Musculoskeletal: He exhibits no edema or tenderness.  Neurological: He is oriented to person, place, and time. He is not disoriented. He displays no tremor. No cranial nerve deficit or sensory deficit. He exhibits normal muscle tone. GCS eye subscore is 4.  GCS verbal subscore is 5. GCS motor subscore is 6.  Somnolence but rousable to voice.   Skin: Capillary refill takes less than 2 seconds. He is not diaphoretic. No erythema. No pallor.  Nursing note and vitals reviewed.    ED Treatments / Results  Labs (all labs ordered are listed, but only abnormal results are displayed) Labs Reviewed  CBC WITH DIFFERENTIAL/PLATELET - Abnormal; Notable for the following components:      Result Value   RBC 2.80 (*)    Hemoglobin 8.2 (*)    HCT 26.5 (*)    Platelets 108 (*)    Lymphs Abs 0.2 (*)    All other components within normal limits  COMPREHENSIVE METABOLIC PANEL - Abnormal; Notable for the following components:   Potassium 2.3 (*)    Chloride 118 (*)    CO2 18 (*)    Glucose, Bld 150 (*)    BUN 25 (*)    Creatinine, Ser 4.56 (*)    Calcium 5.9 (*)    Total Protein 4.2 (*)     Albumin 2.0 (*)    ALT 13 (*)    GFR calc non Af Amer 12 (*)    GFR calc Af Amer 14 (*)    All other components within normal limits  PROTIME-INR - Abnormal; Notable for the following components:   Prothrombin Time 17.7 (*)    All other components within normal limits  MAGNESIUM - Abnormal; Notable for the following components:   Magnesium 1.0 (*)    All other components within normal limits  CBG MONITORING, ED - Abnormal; Notable for the following components:   Glucose-Capillary 204 (*)    All other components within normal limits  I-STAT CG4 LACTIC ACID, ED - Abnormal; Notable for the following components:   Lactic Acid, Venous 3.13 (*)    All other components within normal limits  I-STAT VENOUS BLOOD GAS, ED - Abnormal; Notable for the following components:   pCO2, Ven 30.6 (*)    Bicarbonate 14.9 (*)    TCO2 16 (*)    Acid-base deficit 11.0 (*)    All other components within normal limits  URINE CULTURE  CULTURE, BLOOD (ROUTINE X 2)  CULTURE, BLOOD (ROUTINE X 2)  LIPASE, BLOOD  AMMONIA  TSH  URINALYSIS, ROUTINE W REFLEX MICROSCOPIC  CBC WITH DIFFERENTIAL/PLATELET  COMPREHENSIVE METABOLIC PANEL  LACTIC ACID, PLASMA  LACTIC ACID, PLASMA  PROCALCITONIN  LACTATE DEHYDROGENASE  PATHOLOGIST SMEAR REVIEW  SAVE SMEAR  VITAMIN B12  FOLATE  IRON AND TIBC  FERRITIN  RETICULOCYTES  HIV ANTIBODY (ROUTINE TESTING)  BASIC METABOLIC PANEL  CBC  I-STAT TROPONIN, ED  POC OCCULT BLOOD, ED  I-STAT CG4 LACTIC ACID, ED  I-STAT TROPONIN, ED  TYPE AND SCREEN    EKG EKG Interpretation  Date/Time:  Tuesday Feb 07 2018 21:40:26 EDT Ventricular Rate:  86 PR Interval:    QRS Duration: 83 QT Interval:  370 QTC Calculation: 443 R Axis:   37 Text Interpretation:  Sinus rhythm Low voltage, precordial leads Abnormal R-wave progression, early transition When compared to prior, no significant changes seen.  No STEMI Confirmed by Antony Blackbird [phone]) on 02/07/2018 11:03:59  PM   Radiology Dg Chest Portable 1 View  Result Date: 02/07/2018 CLINICAL DATA:  Weakness.  Vomiting. EXAM: PORTABLE CHEST 1 VIEW COMPARISON:  March 02, 2017 FINDINGS: Stable cardiomegaly. The hila and mediastinum are unchanged. No pneumothorax. Diffuse interstitial opacities in the lungs are similar to the previous study. No nodule or  mass. No focal infiltrate. IMPRESSION: Cardiomegaly. Persistent interstitial opacities could represent recurrent edema versus interstitial lung disease. Recommend clinical correlation. Electronically Signed   By: Dorise Bullion III M.D   On: 02/07/2018 22:14    Procedures Procedures (including critical care time)  CRITICAL CARE Performed by: Gwenyth Allegra Tegeler Total critical care time: 40 minutes Critical care time was exclusive of separately billable procedures and treating other patients. Critical care was necessary to treat or prevent imminent or life-threatening deterioration. Critical care was time spent personally by me on the following activities: development of treatment plan with patient and/or surrogate as well as nursing, discussions with consultants, evaluation of patient's response to treatment, examination of patient, obtaining history from patient or surrogate, ordering and performing treatments and interventions, ordering and review of laboratory studies, ordering and review of radiographic studies, pulse oximetry and re-evaluation of patient's condition.   Medications Ordered in ED Medications  piperacillin-tazobactam (ZOSYN) IVPB 3.375 g (has no administration in time range)  potassium chloride 10 mEq in 100 mL IVPB (10 mEq Intravenous New Bag/Given 02/07/18 2358)  potassium chloride 10 mEq in 100 mL IVPB (has no administration in time range)  calcium gluconate 2 g in sodium chloride 0.9 % 100 mL IVPB (has no administration in time range)  0.9 %  sodium chloride infusion (has no administration in time range)  insulin aspart (novoLOG)  injection 0-9 Units (has no administration in time range)  insulin aspart (novoLOG) injection 0-5 Units (has no administration in time range)  famotidine (PEPCID) IVPB 20 mg premix (has no administration in time range)  ondansetron (ZOFRAN) injection 4 mg (has no administration in time range)  hydrALAZINE (APRESOLINE) injection 5 mg (has no administration in time range)  acetaminophen (TYLENOL) suppository 650 mg (has no administration in time range)  magnesium sulfate IVPB 2 g 50 mL (has no administration in time range)  sodium chloride 0.9 % bolus 1,000 mL (0 mLs Intravenous Stopped 02/07/18 2318)  piperacillin-tazobactam (ZOSYN) IVPB 3.375 g (0 g Intravenous Stopped 02/07/18 2317)  vancomycin (VANCOCIN) 1,500 mg in sodium chloride 0.9 % 500 mL IVPB (1,500 mg Intravenous New Bag/Given 02/07/18 2236)  sodium chloride 0.9 % bolus 1,000 mL (0 mLs Intravenous Stopped 02/07/18 2359)  calcium gluconate 1 g in sodium chloride 0.9 % 100 mL IVPB (1 g Intravenous New Bag/Given 02/07/18 2347)     Initial Impression / Assessment and Plan / ED Course  I have reviewed the triage vital signs and the nursing notes.  Pertinent labs & imaging results that were available during my care of the patient were reviewed by me and considered in my medical decision making (see chart for details).     Blake [name] is a 72 y.o. male with a past medical history significant for ESRD on dialysis, diabetes, hyperlipidemia, and hypertension who presents with somnolence, nausea, vomiting, dry cough, and hypotension.  Patient reports that he had his full dialysis treatment today but has been feeling very fatigued.  He reports having nausea and vomiting.  He is somnolent but arousable to voice.  Patient is altered.  Patient denies headache neck pain or neck stiffness.  He did have some cough on my initial exam.  On my arrival, patient's blood pressure was in the 08X and 44Y systolic.  Patient was tachypneic and had a  temperature of 100.  With the hypotension, patient was quickly made a code sepsis for altered mental status.  Patient's lungs had rhonchi, in the setting of the vomitus seen  in the altered mental status I am concerned about possible aspiration.  Abdomen and chest were nontender.  Patient was easily arousable and did not have any focal neurologic deficits on my initial exam.  Laboratory testing showed significant electrode abnormalities.  Patient given small amount of potassium and calcium.  Patient also given gentle fluids for the hypotension  In the initial lactic acidosis.  Patient was found to be anemic however fecal occult test was negative.  Patient's blood pressure began to respond with fluids.  Decision made to admit to for sepsis of unknown origin with likely pneumonia.  Hospitalist team will admit for further management.   Final Clinical Impressions(s) / ED Diagnoses   Final diagnoses:  Sepsis, due to unspecified organism [facility])    Clinical Impression: 1. Sepsis, due to unspecified organism [facility])     Disposition: Admit  This note was prepared with assistance of Dragon voice recognition software. Occasional wrong-word or sound-a-like substitutions may have occurred due to the inherent limitations of voice recognition software.     Tegeler, Gwenyth Allegra, MD 02/08/18 [phone]

## 2018-02-07 NOTE — ED Triage Notes (Signed)
Pt arrives via [facility] from home, initally called for diabetic concerns, EMG sugar 164. Presents with weakness, lethargy, vomitting x2, oriented x3 on arrival (disoriented to situation); per [facility] 87% on RA, 100% on Nonrebreather; 22 L Hand, 103/54 per [facility]. BP on arrival 82/39  Received 4 zofran

## 2018-02-07 NOTE — Progress Notes (Signed)
Pharmacy Antibiotic Note  Blake [name] is a 72 y.o. male admitted on 02/07/2018 with sepsis.  Pharmacy has been consulted for vancomycin and zosyn dosing. Tmax is 100 and lactic acid is elevated. WBC is WNL.   Plan: Vancomycin 1500mg  IV x 1 then 1gm QHD Zosyn 3.375gm IV Q12H (4 hr inf) F/u renal fxn, C&S, clinical status and pre-HD vanc level  Height: 5\' 8"  (172.7 cm) Weight: 195 lb (88.5 kg) IBW/kg (Calculated) : 68.4  Temp (24hrs), Avg:100 F (37.8 C), Min:100 F (37.8 C), Max:100 F (37.8 C)  Recent Labs  Lab 02/07/18 2145 02/07/18 2150  WBC 7.2  --   LATICACIDVEN  --  3.13*    CrCl cannot be calculated (Patient's most recent lab result is older than the maximum 21 days allowed.).    No Known Allergies  Antimicrobials this admission: Vanc 5/28>> Zosyn 5/28>>  Dose adjustments this admission: N/A  Microbiology results: Pending  Thank you for allowing pharmacy to be a part of this patient's care.  Chavela Justiniano, Rande Lawman 02/07/2018 10:21 PM

## 2018-02-07 NOTE — ED Notes (Signed)
ED Provider at bedside. 

## 2018-02-07 NOTE — H&P (Signed)
History and Physical    Blake [name] ZOX:[id number] DOB: 1946/07/07 DOA: 02/07/2018  Referring MD/NP/PA:   PCP: [name], MD   Patient coming from:  The patient is coming from home.  At baseline, pt is independent for most of ADL.   Chief Complaint: Altered mental status, nausea, vomiting, generalized weakness  HPI: Blake [name] is a 72 y.o. male with medical history significant of ESRD-HD (TTS), hypertension, hyperlipidemia, diabetes mellitus, GERD, depression, CHF, who presents with altered mental status, nausea, vomiting, generalized weakness.  Per report, pt presents with lethargy, altered mental status and generalized weakness.  Patient has nausea and vomited twice.  No diarrhea.  When I saw patient in ED, patient is very drowsy, lethargy, but arousable, oriented x3.  He moves all extremities.  Patient denies abdominal pain.  He also denies chest pain, shortness breath, cough.  No unilateral weakness.  No facial droop or slurred speech noted. Per [facility], pt had oxygen desaturation to 87% on RA, 100% on Nonrebreather. Initial blood pressure 72/44, which improved to 98/50 after giving 2L liters of normal saline bolus in ED. No neck pain or neck rigidity.  ED Course: pt was found to have WBC 7.2, lactic acid 3.13, hemoglobin 8.2 which was 11.6 on 05/03/2017, negative FOBT, ammonia 9, INR 1.45, lipase 25, potassium 2.3, bicarbonate 18, creatinine 4.56, BUN 25, calcium 5.9, Mg 1.6, chest x-ray showed prominent interstitial change.  CT head is negative for acute intracranial abnormalities.  Patient is admitted to stepdown as inpatient.  CT-abdomen/pelvis without contrast showed: 1. Bibasilar airspace opacities raise concern for pneumonia, possibly atypical in nature. Underlying mild fibrotic change noted. 2. Soft tissue inflammation about the bladder could reflect cystitis. 3. Moderate bilateral renal atrophy noted. Scattered bilateral renal cysts, hypodense and hyperdense in  nature. 4. Scattered coronary artery calcifications seen. 5. Enlarged prostate noted.  Review of Systems:   General: no fevers, chills, no body weight gain, has fatigue HEENT: no blurry vision, hearing changes or sore throat Respiratory: no dyspnea, coughing, wheezing CV: no chest pain, no palpitations GI: has nausea, vomiting, no abdominal pain, diarrhea, constipation GU: no dysuria, burning on urination, increased urinary frequency, hematuria  Ext: no leg edema Neuro: no unilateral weakness, numbness, or tingling, no vision change or hearing loss. Has AMS. Skin: no rash, no skin tear. MSK: No muscle spasm, no deformity, no limitation of range of movement in spin Heme: No easy bruising.  Travel history: No recent long distant travel.  Allergy: No Known Allergies  Past Medical History:  Diagnosis Date  . Acute osteomyelitis of toe of right foot ([place]) 08/02/2015  . Diabetes mellitus without complication ([place])   . Hyperlipidemia   . Hypertension   . Renal disorder     Past Surgical History:  Procedure Laterality Date  . AMPUTATION TOE Right 08/04/2015   Procedure: RIGHT GREAT TOE AMPUTATION;  Surgeon: [name], MD;  Location: [facility];  Service: Orthopedics;  Laterality: Right;  . AV FISTULA PLACEMENT Right 07/05/2014   Procedure: ARTERIOVENOUS BRACHIALCEPHALIC (AV) FISTULA CREATION;  Surgeon: Conrad Campton, MD;  Location: [facility];  Service: Vascular;  Laterality: Right;  . BACK SURGERY    . ESOPHAGOGASTRODUODENOSCOPY (EGD) WITH PROPOFOL N/A 11/28/2015   Procedure: ESOPHAGOGASTRODUODENOSCOPY (EGD) WITH PROPOFOL;  Surgeon: [name], MD;  Location: [facility];  Service: Endoscopy;  Laterality: N/A;  . INSERTION OF DIALYSIS CATHETER Left 07/05/2014   Procedure: INSERTION OF DIALYSIS CATHETER-LEFT INTERNAL JUGULAR PLACEMENT;  Surgeon: Conrad Henrietta, MD;  Location: [facility];  Service: Vascular;  Laterality: Left;  . REMOVAL OF A DIALYSIS CATHETER Right 07/05/2014   Procedure:  REMOVAL OF A DIALYSIS CATHETER;  Surgeon: Conrad [place] Hom[place], MD;  Location: [facility];  Service: Vascular;  Laterality: Right;    Social History:  reports that he quit smoking about 38 years ago. His smoking use included cigarettes. He quit after 5.00 years of use. He has never used smokeless tobacco. He reports that he does not drink alcohol or use drugs.  Family History:  Family History  Problem Relation Age of Onset  . Diabetes Father   . Heart attack Father      Prior to Admission medications   Medication Sig Start Date End Date Taking? Authorizing Provider  amLODipine (NORVASC) 10 MG tablet Take 10 mg by mouth daily.   Yes [provider]  aspirin EC 81 MG tablet Take 81 mg by mouth daily.   Yes [provider]  atorvastatin (LIPITOR) 20 MG tablet Take 20 mg by mouth daily.   Yes [provider]  Calcium Acetate, Phos Binder, (CALCIUM ACETATE PO) Take by mouth.   Yes [provider]  multivitamin (RENA-VIT) TABS tablet Take 1 tablet by mouth daily.   Yes [provider]  pantoprazole (PROTONIX) 20 MG tablet Take 1 tablet (20 mg total) by mouth daily. Patient taking differently: Take 40 mg by mouth 2 (two) times daily.  10/25/15  Yes Patel-Mills, Orvil Feil, PA-C  PARoxetine (PAXIL) 10 MG tablet Take 10 mg by mouth daily.   Yes [provider]  ranitidine (ZANTAC) 150 MG tablet Take 150 mg by mouth at bedtime.   Yes [provider]  bisacodyl (DULCOLAX) 10 MG suppository Place 1 suppository (10 mg total) rectally daily as needed for moderate constipation. 04/22/16   Mikhail, Velta Addison, DO  buPROPion (WELLBUTRIN XL) 150 MG 24 hr tablet Take 300 mg by mouth daily.    [provider]  DULoxetine (CYMBALTA) 20 MG capsule Take 1 capsule (20 mg total) by mouth daily. Patient taking differently: Take 20 mg by mouth daily after supper.  07/09/14   Elgergawy, Silver Huguenin, MD  NOVOLIN R RELION 100 UNIT/ML injection Inject 3-13 Units into the  skin 3 (three) times daily with meals as needed for high blood sugar.  06/13/14   [provider]  SENSIPAR 30 MG tablet Take 1 tablet by mouth daily. 07/23/15   [provider]  sevelamer carbonate (RENVELA) 800 MG tablet Take 1 tablet (800 mg total) by mouth 3 (three) times daily with meals. 08/23/14   Conrad Rockmart, MD    Physical Exam: Vitals:   02/08/18 0400 02/08/18 0415 02/08/18 0430 02/08/18 0445  BP: (!) 84/54 (!) 100/55 (!) 104/54 (!) 96/57  Pulse: 79 78 86 73  Resp: 19 (!) 23 (!) 23 20  Temp:      TempSrc:      SpO2: 97% 96% 97% 96%  Weight:      Height:       General: Not in acute distress HEENT:       Eyes: PERRL, EOMI, no scleral icterus.       ENT: No discharge from the ears and nose, no pharynx injection, no tonsillar enlargement.        Neck: No JVD, no bruit, no mass felt. Heme: No neck lymph node enlargement. Cardiac: S1/S2, RRR, No murmurs, No gallops or rubs. Respiratory: No rales, wheezing, rhonchi or rubs. GI: Soft, nondistended, nontender, no rebound pain,  no organomegaly, BS present. GU: No hematuria Ext: No pitting leg edema bilaterally. 2+DP/PT pulse bilaterally. Musculoskeletal: No joint deformities, No joint redness or warmth, no limitation of ROM in spin. Skin: No rashes.  Neuro: very drowsy, but arousable and is oriented X3, cranial nerves II-XII grossly intact, moves all extremities. Neck is supple. Psych: Patient is not psychotic, no suicidal or hemocidal ideation.  Labs on Admission: I have personally reviewed following labs and imaging studies  CBC: Recent Labs  Lab 02/07/18 2145 02/08/18 0022 02/08/18 0255  WBC 7.2 10.8* 13.0*  NEUTROABS 6.1 8.7*  --   HGB 8.2* 9.9* 10.8*  HCT 26.5* 32.1* 35.1*  MCV 94.6 93.9 93.4  PLT 108* 136* 601*   Basic Metabolic Panel: Recent Labs  Lab 02/07/18 2145 02/07/18 2345 02/08/18 0022 02/08/18 0255  NA 145  --  140 139  K 2.3*  --  5.0 4.4  CL 118*  --  103 102  CO2 18*  --   24 23  GLUCOSE 150*  --  185* 190*  BUN 25*  --  35* 40*  CREATININE 4.56*  --  6.98* 7.25*  CALCIUM 5.9*  --  9.1 9.0  MG  --  1.0*  --  1.6*   GFR: Estimated Creatinine Clearance: 10.2 mL/min (A) (by C-G formula based on SCr of 7.25 mg/dL (H)). Liver Function Tests: Recent Labs  Lab 02/07/18 2145 02/08/18 0022  AST 18 25  ALT 13* 17  ALKPHOS 52 70  BILITOT 0.6 1.2  PROT 4.2* 6.0*  ALBUMIN 2.0* 2.7*   Recent Labs  Lab 02/07/18 2145  LIPASE 25   Recent Labs  Lab 02/07/18 2150  AMMONIA 9   Coagulation Profile: Recent Labs  Lab 02/07/18 2145  INR 1.47   Cardiac Enzymes: Recent Labs  Lab 02/08/18 0255  TROPONINI <0.03   BNP (last 3 results) No results for input(s): PROBNP in the last 8760 hours. HbA1C: No results for input(s): HGBA1C in the last 72 hours. CBG: Recent Labs  Lab 02/07/18 2128 02/08/18 0138  GLUCAP 204* 169*   Lipid Profile: No results for input(s): CHOL, HDL, LDLCALC, TRIG, CHOLHDL, LDLDIRECT in the last 72 hours. Thyroid Function Tests: Recent Labs    02/07/18 2150  TSH 0.706   Anemia Panel: Recent Labs    02/08/18 0022  VITAMINB12 658  FOLATE 13.6  FERRITIN 1,090*  TIBC 144*  IRON 10*  RETICCTPCT 2.4   Urine analysis:    Component Value Date/Time   COLORURINE YELLOW 08/01/2015 0642   APPEARANCEUR CLEAR 08/01/2015 0642   LABSPEC 1.013 08/01/2015 0642   PHURINE 8.0 08/01/2015 0642   GLUCOSEU 100 (A) 08/01/2015 0642   HGBUR MODERATE (A) 08/01/2015 0642   BILIRUBINUR NEGATIVE 08/01/2015 0642   KETONESUR NEGATIVE 08/01/2015 0642   PROTEINUR 100 (A) 08/01/2015 0642   UROBILINOGEN 0.2 06/28/2014 1417   NITRITE NEGATIVE 08/01/2015 0642   LEUKOCYTESUR NEGATIVE 08/01/2015 0642   Sepsis Labs: @LABRCNTIP (procalcitonin:4,lacticidven:4) )No results found for this or any previous visit (from the past 240 hour(s)).   Radiological Exams on Admission: Ct Abdomen Pelvis Wo Contrast  Result Date: 02/08/2018 CLINICAL DATA:   Acute onset of generalized weakness, nausea and vomiting. EXAM: CT ABDOMEN AND PELVIS WITHOUT CONTRAST TECHNIQUE: Multidetector CT imaging of the abdomen and pelvis was performed following the standard protocol without IV contrast. COMPARISON:  Right upper quadrant ultrasound performed 04/21/2016 FINDINGS: Lower chest: Bibasilar airspace opacities raise concern for pneumonia, possibly atypical in nature. Underlying mild fibrotic change is  noted. The visualized portions of the mediastinum are grossly unremarkable. Scattered coronary artery calcifications are seen. Hepatobiliary: The liver is unremarkable in appearance. The gallbladder is unremarkable in appearance. The common bile duct remains normal in caliber. Pancreas: The pancreas is within normal limits. Spleen: The spleen is unremarkable in appearance. Adrenals/Urinary Tract: The adrenal glands are unremarkable in appearance. Moderate bilateral renal atrophy is noted. Scattered bilateral renal cysts are seen, hypodense and hyperdense in nature. Nonspecific perinephric stranding is noted bilaterally. There is no evidence of hydronephrosis. No renal or ureteral stones are identified Stomach/Bowel: The stomach is unremarkable in appearance. The small bowel is within normal limits. The appendix is normal in caliber, without evidence of appendicitis. The colon is unremarkable in appearance. Vascular/Lymphatic: The abdominal aorta is unremarkable in appearance. The inferior vena cava is grossly unremarkable. No retroperitoneal lymphadenopathy is seen. No pelvic sidewall lymphadenopathy is identified. Reproductive: The bladder is decompressed. Soft tissue inflammation about the bladder could reflect cystitis. The prostate is enlarged, measuring 5.2 cm in transverse dimension. Other: No additional soft tissue abnormalities are seen. Musculoskeletal: No acute osseous abnormalities are identified. Vacuum phenomenon is noted at L5-S1. The visualized musculature is  unremarkable in appearance. IMPRESSION: 1. Bibasilar airspace opacities raise concern for pneumonia, possibly atypical in nature. Underlying mild fibrotic change noted. 2. Soft tissue inflammation about the bladder could reflect cystitis. 3. Moderate bilateral renal atrophy noted. Scattered bilateral renal cysts, hypodense and hyperdense in nature. 4. Scattered coronary artery calcifications seen. 5. Enlarged prostate noted. Electronically Signed   By: Garald Balding M.D.   On: 02/08/2018 01:24   Ct Head Wo Contrast  Result Date: 02/08/2018 CLINICAL DATA:  Acute onset of generalized weakness and lethargy. EXAM: CT HEAD WITHOUT CONTRAST TECHNIQUE: Contiguous axial images were obtained from the base of the skull through the vertex without intravenous contrast. COMPARISON:  CT of the head performed 08/01/2015 FINDINGS: Brain: No evidence of acute infarction, hemorrhage, hydrocephalus, extra-axial collection or mass lesion / mass effect. Prominence of the ventricles and sulci reflects mild cortical volume loss. Scattered periventricular and subcortical white matter change likely reflects small vessel ischemic microangiopathy. The brainstem and fourth ventricle are within normal limits. The basal ganglia are unremarkable in appearance. The cerebral hemispheres demonstrate grossly normal gray-white differentiation. No mass effect or midline shift is seen. Vascular: No hyperdense vessel or unexpected calcification. Skull: There is no evidence of fracture; visualized osseous structures are unremarkable in appearance. Sinuses/Orbits: The orbits are within normal limits. The paranasal sinuses and mastoid air cells are well-aerated. Other: No significant soft tissue abnormalities are seen. IMPRESSION: 1. No acute intracranial pathology seen on CT. 2. Mild cortical volume loss and scattered small vessel ischemic microangiopathy. Electronically Signed   By: Garald Balding M.D.   On: 02/08/2018 01:17   Dg Chest Portable 1  View  Result Date: 02/07/2018 CLINICAL DATA:  Weakness.  Vomiting. EXAM: PORTABLE CHEST 1 VIEW COMPARISON:  March 02, 2017 FINDINGS: Stable cardiomegaly. The hila and mediastinum are unchanged. No pneumothorax. Diffuse interstitial opacities in the lungs are similar to the previous study. No nodule or mass. No focal infiltrate. IMPRESSION: Cardiomegaly. Persistent interstitial opacities could represent recurrent edema versus interstitial lung disease. Recommend clinical correlation. Electronically Signed   By: Dorise Bullion III M.D   On: 02/07/2018 22:14     EKG: Independently reviewed.  Sinus rhythm, QTC 443, low voltage, early R wave progression.    Assessment/Plan Principal Problem:   Sepsis ([place]) Active Problems:   ESRD on dialysis ([place])  Depression   Encephalopathy, metabolic   Nausea and vomiting   Thrombocytopenia ([name])   Type II diabetes mellitus with renal manifestations ([place])   Hypocalcemia   Hypokalemia   Anemia   Hypomagnesemia   Sepsis ([place]): pt is septic with hypotension, fever, tachypnea.  Lactic acid is elevated at 2.13, trending up to 4.0. Bp responded to IV fluid resuscitation, improved to SBP 90s after 2L of NS bolus.  Source of infection is not clear.  CT abdomen/pelvis showed bibasilar infiltration, concerning for pneumonia.  Since patient has altered mental status, aspiration pneumonia is possible.  Pending urinalysis to rule out UTI. - will admit to SDU as inpt - IV vanco and zosyn - prn Albuterol Nebs for SOB - Urine legionella and S. pneumococcal antigen - Follow up blood culture x2, sputum culture, UA and UA  - will get Procalcitonin and trend lactic acid level per sepsis protocol - IVF: 2L plus 500 cc of NS bolus, followed by 50 mL per hour of NS (patient has ESRD limiting aggressive IV fluids treatment). Will give prn NS bolus as needed  Encephalopathy, metabolic: CT head is negative for acute intracranial abnormalities.  Likely due to sepsis.  Patient  had thrombocytopenia, we need to rule out TTP -Frequent neuro check -Treat sepsis as above -Check LDH, peripheral smear  Thrombocytopenia: Platelet 102, possibly due to sepsis and ongoing infection.  Will also need to rule out TTP as above -f/u by CBC  ESRD on dialysis (TTS): potassium 2.3, bicarbonate 18, creatinine 4.56, BUN 25. -please call renal for HD  Depression: -hold oral meds  HTN: -hold Bp meds due to hypotension -IV hydralazine as needed  Type II diabetes mellitus with renal manifestations ([place]): Last A1c 6.0 on 07/02/14, well controled. Patient is taking novolin at home -SSI  Electrolytes disturbance: Hypocalcemia, Hypokalemia and hypomagnesemia -repleted all.  -repeated BMP showed normal K and Ca.  Anemia: Hemoglobin dropped from 11.8 on 05/03/2017 to 8.2.  FOBT negative. Likely due to ESRD. -check anemia panel  Nausea, vomiting: Etiology is not clear.  Possibly due to sepsis.  CT abdomen/pelvis is not impressive for intra abdominal issues. -PRN Zofran for nausea and vomiting. -IV fluid as above    DVT ppx: SCD Code Status: Full code Family Communication: None at bed side.   Disposition Plan:  Anticipate discharge back to previous home environment Consults called:  none Admission status:   SDU/inpation       Date of Service 02/08/2018    Ivor Costa [facility] Pager [phone]  If 7PM-7AM, please contact night-coverage [email or web address] Password [facility] 02/08/2018, 4:54 AM

## 2018-02-07 NOTE — ED Notes (Signed)
MD made aware of pt decreasing BP despite IV fluid resuscitation

## 2018-02-08 ENCOUNTER — Other Ambulatory Visit: Payer: Self-pay

## 2018-02-08 ENCOUNTER — Inpatient Hospital Stay (HOSPITAL_COMMUNITY): Payer: Medicare Other

## 2018-02-08 ENCOUNTER — Encounter (HOSPITAL_COMMUNITY): Payer: Self-pay | Admitting: General Practice

## 2018-02-08 DIAGNOSIS — A419 Sepsis, unspecified organism: Principal | ICD-10-CM

## 2018-02-08 DIAGNOSIS — E1122 Type 2 diabetes mellitus with diabetic chronic kidney disease: Secondary | ICD-10-CM | POA: Diagnosis not present

## 2018-02-08 DIAGNOSIS — N400 Enlarged prostate without lower urinary tract symptoms: Secondary | ICD-10-CM | POA: Diagnosis present

## 2018-02-08 DIAGNOSIS — M6281 Muscle weakness (generalized): Secondary | ICD-10-CM | POA: Diagnosis not present

## 2018-02-08 DIAGNOSIS — N186 End stage renal disease: Secondary | ICD-10-CM | POA: Diagnosis not present

## 2018-02-08 DIAGNOSIS — G9341 Metabolic encephalopathy: Secondary | ICD-10-CM

## 2018-02-08 DIAGNOSIS — I5032 Chronic diastolic (congestive) heart failure: Secondary | ICD-10-CM | POA: Diagnosis present

## 2018-02-08 DIAGNOSIS — I5030 Unspecified diastolic (congestive) heart failure: Secondary | ICD-10-CM | POA: Diagnosis not present

## 2018-02-08 DIAGNOSIS — Z992 Dependence on renal dialysis: Secondary | ICD-10-CM

## 2018-02-08 DIAGNOSIS — R270 Ataxia, unspecified: Secondary | ICD-10-CM | POA: Diagnosis not present

## 2018-02-08 DIAGNOSIS — R1319 Other dysphagia: Secondary | ICD-10-CM | POA: Diagnosis not present

## 2018-02-08 DIAGNOSIS — D649 Anemia, unspecified: Secondary | ICD-10-CM | POA: Diagnosis not present

## 2018-02-08 DIAGNOSIS — R112 Nausea with vomiting, unspecified: Secondary | ICD-10-CM | POA: Diagnosis not present

## 2018-02-08 DIAGNOSIS — D696 Thrombocytopenia, unspecified: Secondary | ICD-10-CM

## 2018-02-08 DIAGNOSIS — I959 Hypotension, unspecified: Secondary | ICD-10-CM | POA: Diagnosis not present

## 2018-02-08 DIAGNOSIS — Z8249 Family history of ischemic heart disease and other diseases of the circulatory system: Secondary | ICD-10-CM | POA: Diagnosis not present

## 2018-02-08 DIAGNOSIS — G92 Toxic encephalopathy: Secondary | ICD-10-CM | POA: Diagnosis present

## 2018-02-08 DIAGNOSIS — E785 Hyperlipidemia, unspecified: Secondary | ICD-10-CM | POA: Diagnosis present

## 2018-02-08 DIAGNOSIS — F329 Major depressive disorder, single episode, unspecified: Secondary | ICD-10-CM | POA: Diagnosis present

## 2018-02-08 DIAGNOSIS — K219 Gastro-esophageal reflux disease without esophagitis: Secondary | ICD-10-CM | POA: Diagnosis not present

## 2018-02-08 DIAGNOSIS — I132 Hypertensive heart and chronic kidney disease with heart failure and with stage 5 chronic kidney disease, or end stage renal disease: Secondary | ICD-10-CM | POA: Diagnosis present

## 2018-02-08 DIAGNOSIS — I120 Hypertensive chronic kidney disease with stage 5 chronic kidney disease or end stage renal disease: Secondary | ICD-10-CM | POA: Diagnosis not present

## 2018-02-08 DIAGNOSIS — I2510 Atherosclerotic heart disease of native coronary artery without angina pectoris: Secondary | ICD-10-CM | POA: Diagnosis present

## 2018-02-08 DIAGNOSIS — N2581 Secondary hyperparathyroidism of renal origin: Secondary | ICD-10-CM | POA: Diagnosis present

## 2018-02-08 DIAGNOSIS — Z833 Family history of diabetes mellitus: Secondary | ICD-10-CM | POA: Diagnosis not present

## 2018-02-08 DIAGNOSIS — D509 Iron deficiency anemia, unspecified: Secondary | ICD-10-CM | POA: Diagnosis present

## 2018-02-08 DIAGNOSIS — R1111 Vomiting without nausea: Secondary | ICD-10-CM | POA: Diagnosis not present

## 2018-02-08 DIAGNOSIS — J189 Pneumonia, unspecified organism: Secondary | ICD-10-CM

## 2018-02-08 DIAGNOSIS — R531 Weakness: Secondary | ICD-10-CM | POA: Diagnosis not present

## 2018-02-08 DIAGNOSIS — Z89421 Acquired absence of other right toe(s): Secondary | ICD-10-CM | POA: Diagnosis not present

## 2018-02-08 DIAGNOSIS — E876 Hypokalemia: Secondary | ICD-10-CM | POA: Diagnosis not present

## 2018-02-08 DIAGNOSIS — J9601 Acute respiratory failure with hypoxia: Secondary | ICD-10-CM | POA: Diagnosis present

## 2018-02-08 DIAGNOSIS — D631 Anemia in chronic kidney disease: Secondary | ICD-10-CM | POA: Diagnosis present

## 2018-02-08 DIAGNOSIS — N281 Cyst of kidney, acquired: Secondary | ICD-10-CM | POA: Diagnosis not present

## 2018-02-08 DIAGNOSIS — R6889 Other general symptoms and signs: Secondary | ICD-10-CM | POA: Diagnosis not present

## 2018-02-08 DIAGNOSIS — Z794 Long term (current) use of insulin: Secondary | ICD-10-CM | POA: Diagnosis not present

## 2018-02-08 LAB — LACTIC ACID, PLASMA
Lactic Acid, Venous: 3.9 mmol/L (ref 0.5–1.9)
Lactic Acid, Venous: 4.1 mmol/L (ref 0.5–1.9)

## 2018-02-08 LAB — CBC WITH DIFFERENTIAL/PLATELET
BASOS ABS: 0 10*3/uL (ref 0.0–0.1)
Basophils Relative: 0 %
EOS ABS: 0.2 10*3/uL (ref 0.0–0.7)
Eosinophils Relative: 2 %
HCT: 32.1 % — ABNORMAL LOW (ref 39.0–52.0)
HEMOGLOBIN: 9.9 g/dL — AB (ref 13.0–17.0)
LYMPHS PCT: 4 %
Lymphs Abs: 0.4 10*3/uL — ABNORMAL LOW (ref 0.7–4.0)
MCH: 28.9 pg (ref 26.0–34.0)
MCHC: 30.8 g/dL (ref 30.0–36.0)
MCV: 93.9 fL (ref 78.0–100.0)
MONOS PCT: 14 %
Monocytes Absolute: 1.5 10*3/uL — ABNORMAL HIGH (ref 0.1–1.0)
NEUTROS PCT: 80 %
Neutro Abs: 8.7 10*3/uL — ABNORMAL HIGH (ref 1.7–7.7)
Platelets: 136 10*3/uL — ABNORMAL LOW (ref 150–400)
RBC: 3.42 MIL/uL — AB (ref 4.22–5.81)
RDW: 14 % (ref 11.5–15.5)
WBC: 10.8 10*3/uL — AB (ref 4.0–10.5)

## 2018-02-08 LAB — COMPREHENSIVE METABOLIC PANEL
ALBUMIN: 2.7 g/dL — AB (ref 3.5–5.0)
ALT: 13 U/L — ABNORMAL LOW (ref 17–63)
ALT: 17 U/L (ref 17–63)
ANION GAP: 13 (ref 5–15)
ANION GAP: 9 (ref 5–15)
AST: 18 U/L (ref 15–41)
AST: 25 U/L (ref 15–41)
Albumin: 2 g/dL — ABNORMAL LOW (ref 3.5–5.0)
Alkaline Phosphatase: 70 U/L (ref 38–126)
Alkaline Phosphatase: 52 U/L (ref 38–126)
BUN: 25 mg/dL — ABNORMAL HIGH (ref 6–20)
BUN: 35 mg/dL — AB (ref 6–20)
CHLORIDE: 118 mmol/L — AB (ref 101–111)
CO2: 18 mmol/L — ABNORMAL LOW (ref 22–32)
CO2: 24 mmol/L (ref 22–32)
Calcium: 9.1 mg/dL (ref 8.9–10.3)
Calcium: 5.9 mg/dL — CL (ref 8.9–10.3)
Chloride: 103 mmol/L (ref 101–111)
Creatinine, Ser: 6.98 mg/dL — ABNORMAL HIGH (ref 0.61–1.24)
Creatinine, Ser: 4.56 mg/dL — ABNORMAL HIGH (ref 0.61–1.24)
GFR calc Af Amer: 8 mL/min — ABNORMAL LOW (ref >60)
GFR calc non Af Amer: 7 mL/min — ABNORMAL LOW (ref >60)
GFR calc non Af Amer: 12 mL/min — ABNORMAL LOW (ref >60)
GFR, EST AFRICAN AMERICAN: 14 mL/min — AB (ref >60)
GLUCOSE: 150 mg/dL — AB (ref 65–99)
GLUCOSE: 185 mg/dL — AB (ref 65–99)
POTASSIUM: 2.3 mmol/L — AB (ref 3.5–5.1)
Potassium: 5 mmol/L (ref 3.5–5.1)
SODIUM: 140 mmol/L (ref 135–145)
SODIUM: 145 mmol/L (ref 135–145)
Total Bilirubin: 1.2 mg/dL (ref 0.3–1.2)
Total Bilirubin: 0.6 mg/dL (ref 0.3–1.2)
Total Protein: 6 g/dL — ABNORMAL LOW (ref 6.5–8.1)
Total Protein: 4.2 g/dL — ABNORMAL LOW (ref 6.5–8.1)

## 2018-02-08 LAB — CBC
HEMATOCRIT: 35.1 % — AB (ref 39.0–52.0)
HEMOGLOBIN: 10.8 g/dL — AB (ref 13.0–17.0)
MCH: 28.7 pg (ref 26.0–34.0)
MCHC: 30.8 g/dL (ref 30.0–36.0)
MCV: 93.4 fL (ref 78.0–100.0)
Platelets: 131 10*3/uL — ABNORMAL LOW (ref 150–400)
RBC: 3.76 MIL/uL — AB (ref 4.22–5.81)
RDW: 14 % (ref 11.5–15.5)
WBC: 13 10*3/uL — ABNORMAL HIGH (ref 4.0–10.5)

## 2018-02-08 LAB — IRON AND TIBC
Iron: 10 ug/dL — ABNORMAL LOW (ref 45–182)
SATURATION RATIOS: 7 % — AB (ref 17.9–39.5)
TIBC: 144 ug/dL — ABNORMAL LOW (ref 250–450)
UIBC: 134 ug/dL

## 2018-02-08 LAB — HIV ANTIBODY (ROUTINE TESTING W REFLEX): HIV Screen 4th Generation wRfx: NONREACTIVE

## 2018-02-08 LAB — BASIC METABOLIC PANEL
ANION GAP: 14 (ref 5–15)
BUN: 40 mg/dL — ABNORMAL HIGH (ref 6–20)
CHLORIDE: 102 mmol/L (ref 101–111)
CO2: 23 mmol/L (ref 22–32)
Calcium: 9 mg/dL (ref 8.9–10.3)
Creatinine, Ser: 7.25 mg/dL — ABNORMAL HIGH (ref 0.61–1.24)
GFR calc non Af Amer: 7 mL/min — ABNORMAL LOW (ref >60)
GFR, EST AFRICAN AMERICAN: 8 mL/min — AB (ref >60)
Glucose, Bld: 190 mg/dL — ABNORMAL HIGH (ref 65–99)
POTASSIUM: 4.4 mmol/L (ref 3.5–5.1)
Sodium: 139 mmol/L (ref 135–145)

## 2018-02-08 LAB — GLUCOSE, CAPILLARY: GLUCOSE-CAPILLARY: 102 mg/dL — AB (ref 65–99)

## 2018-02-08 LAB — RETICULOCYTES
RBC.: 3.42 MIL/uL — AB (ref 4.22–5.81)
RETIC CT PCT: 2.4 % (ref 0.4–3.1)
Retic Count, Absolute: 81.6 10*3/uL (ref 19.0–186.0)

## 2018-02-08 LAB — TROPONIN I

## 2018-02-08 LAB — LACTATE DEHYDROGENASE: LDH: 138 U/L (ref 98–192)

## 2018-02-08 LAB — SAVE SMEAR

## 2018-02-08 LAB — ABO/RH: ABO/RH(D): A POS

## 2018-02-08 LAB — TYPE AND SCREEN
ABO/RH(D): A POS
ANTIBODY SCREEN: NEGATIVE

## 2018-02-08 LAB — MAGNESIUM
MAGNESIUM: 1 mg/dL — AB (ref 1.7–2.4)
MAGNESIUM: 1.6 mg/dL — AB (ref 1.7–2.4)

## 2018-02-08 LAB — FERRITIN: Ferritin: 1090 ng/mL — ABNORMAL HIGH (ref 24–336)

## 2018-02-08 LAB — PROCALCITONIN: Procalcitonin: 51.6 ng/mL

## 2018-02-08 LAB — CBG MONITORING, ED
GLUCOSE-CAPILLARY: 117 mg/dL — AB (ref 65–99)
Glucose-Capillary: 169 mg/dL — ABNORMAL HIGH (ref 65–99)

## 2018-02-08 LAB — FOLATE: FOLATE: 13.6 ng/mL (ref >5.9)

## 2018-02-08 LAB — VITAMIN B12: VITAMIN B 12: 658 pg/mL (ref 180–914)

## 2018-02-08 LAB — PATHOLOGIST SMEAR REVIEW

## 2018-02-08 MED ORDER — MAGNESIUM SULFATE IN D5W 1-5 GM/100ML-% IV SOLN
1.0000 g | Freq: Once | INTRAVENOUS | Status: AC
  Administered 2018-02-08: 1 g via INTRAVENOUS
  Filled 2018-02-08: qty 100

## 2018-02-08 MED ORDER — HEPARIN SODIUM (PORCINE) 1000 UNIT/ML DIALYSIS
3000.0000 [IU] | INTRAMUSCULAR | Status: DC | PRN
  Filled 2018-02-08: qty 3

## 2018-02-08 MED ORDER — PENTAFLUOROPROP-TETRAFLUOROETH EX AERO
1.0000 "application " | INHALATION_SPRAY | CUTANEOUS | Status: DC | PRN
  Filled 2018-02-08: qty 103.5

## 2018-02-08 MED ORDER — SODIUM CHLORIDE 0.9 % IV BOLUS
500.0000 mL | Freq: Once | INTRAVENOUS | Status: AC
  Administered 2018-02-08: 500 mL via INTRAVENOUS

## 2018-02-08 MED ORDER — SODIUM CHLORIDE 0.9 % IV SOLN: 100.0000 mL | INTRAVENOUS | Status: DC | PRN

## 2018-02-08 MED ORDER — LIDOCAINE-PRILOCAINE 2.5-2.5 % EX CREA
1.0000 "application " | TOPICAL_CREAM | CUTANEOUS | Status: DC | PRN
  Filled 2018-02-08: qty 5

## 2018-02-08 MED ORDER — VANCOMYCIN HCL IN DEXTROSE 1-5 GM/200ML-% IV SOLN
1000.0000 mg | INTRAVENOUS | Status: DC
  Filled 2018-02-08: qty 200

## 2018-02-08 MED ORDER — CHLORHEXIDINE GLUCONATE CLOTH 2 % EX PADS
6.0000 | MEDICATED_PAD | Freq: Every day | CUTANEOUS | Status: DC
  Administered 2018-02-09: 6 via TOPICAL

## 2018-02-08 MED ORDER — ALBUTEROL SULFATE (2.5 MG/3ML) 0.083% IN NEBU: 2.5000 mg | INHALATION_SOLUTION | RESPIRATORY_TRACT | Status: DC | PRN

## 2018-02-08 MED ORDER — MAGNESIUM SULFATE 2 GM/50ML IV SOLN: 2.0000 g | Freq: Once | INTRAVENOUS | Status: DC

## 2018-02-08 NOTE — ED Notes (Signed)
MD at bedside to round

## 2018-02-08 NOTE — ED Notes (Signed)
Repeat labs recollected at [facility]. See chart for follow-up results.

## 2018-02-08 NOTE — ED Notes (Signed)
Writer bladder scanned pt, zero output for bladder scan

## 2018-02-08 NOTE — Consult Note (Addendum)
Renal Service Consult Note [facility]  Blake [name] 02/08/2018 [place] Requesting Physician:  Dr Algis Liming  Reason for Consult:  ESRD pt with AMS, n/v, gen weakness and hypotension HPI: The patient is a 72 y.o. year-old with hx of dm2 hypertension gerd depression chf and esrd on hd presented to ed by ems from home for n/v lethargy.  In ed bp's were low and pt slightly confused very weak and febrile w/ temps 100 deg F. cxr showed interstitial changes and ct abdomen showed bibasilar pneumonia and no other acute findings. Patient rec'd 2L NS bolus in ed with improved bp's from 70's to 98/50.  Pt was admitted w/ dx of sepsis. Lactic acid was 2.1 > 4.0. Suspected pna and r/o UTI, started on IV abx vanc/ zosyn w/ prn nebs for SOB and blood / sputum cx's ordered.  Asked to see for esrd.   Patient denies any cp or sob at this time, poor historian but O x3. Very weak.  No abd pain did have n/v at home x 1-2.  No headaceh or visual changes.    ROS  denies CP  no joint pain   no HA  no blurry vision  no rash    Past Medical History  Past Medical History:  Diagnosis Date  . Acute osteomyelitis of toe of right foot ([place]) 08/02/2015  . Diabetes mellitus without complication ([place])   . Hyperlipidemia   . Hypertension   . Renal disorder    Past Surgical History  Past Surgical History:  Procedure Laterality Date  . AMPUTATION TOE Right 08/04/2015   Procedure: RIGHT GREAT TOE AMPUTATION;  Surgeon: [name], MD;  Location: [place];  Service: Orthopedics;  Laterality: Right;  . AV FISTULA PLACEMENT Right 07/05/2014   Procedure: ARTERIOVENOUS BRACHIALCEPHALIC (AV) FISTULA CREATION;  Surgeon: Conrad Owings, MD;  Location: [place];  Service: Vascular;  Laterality: Right;  . BACK SURGERY    . ESOPHAGOGASTRODUODENOSCOPY (EGD) WITH PROPOFOL N/A 11/28/2015   Procedure: ESOPHAGOGASTRODUODENOSCOPY (EGD) WITH PROPOFOL;  Surgeon: [name], MD;  Location: [facility];   Service: Endoscopy;  Laterality: N/A;  . INSERTION OF DIALYSIS CATHETER Left 07/05/2014   Procedure: INSERTION OF DIALYSIS CATHETER-LEFT INTERNAL JUGULAR PLACEMENT;  Surgeon: Conrad Richwood, MD;  Location: [place];  Service: Vascular;  Laterality: Left;  . REMOVAL OF A DIALYSIS CATHETER Right 07/05/2014   Procedure: REMOVAL OF A DIALYSIS CATHETER;  Surgeon: Conrad Quinn, MD;  Location: [place];  Service: Vascular;  Laterality: Right;   Family History  Family History  Problem Relation Age of Onset  . Diabetes Father   . Heart attack Father    Social History  reports that he quit smoking about 38 years ago. His smoking use included cigarettes. He quit after 5.00 years of use. He has never used smokeless tobacco. He reports that he does not drink alcohol or use drugs. Allergies No Known Allergies Home medications Prior to Admission medications   Medication Sig Start Date End Date Taking? Authorizing Provider  amLODipine (NORVASC) 10 MG tablet Take 10 mg by mouth daily.   Yes [provider]  aspirin EC 81 MG tablet Take 81 mg by mouth daily.   Yes [provider]  atorvastatin (LIPITOR) 20 MG tablet Take 20 mg by mouth daily.   Yes [provider]  Calcium Acetate, Phos Binder, (CALCIUM ACETATE PO) Take by mouth.   Yes [provider]  multivitamin (RENA-VIT) TABS tablet Take 1  tablet by mouth daily.   Yes [provider]  pantoprazole (PROTONIX) 20 MG tablet Take 1 tablet (20 mg total) by mouth daily. Patient taking differently: Take 40 mg by mouth 2 (two) times daily.  10/25/15  Yes Patel-Mills, Orvil Feil, PA-C  PARoxetine (PAXIL) 10 MG tablet Take 10 mg by mouth daily.   Yes [provider]  ranitidine (ZANTAC) 150 MG tablet Take 150 mg by mouth at bedtime.   Yes [provider]  bisacodyl (DULCOLAX) 10 MG suppository Place 1 suppository (10 mg total) rectally daily as needed for moderate constipation. 04/22/16   Mikhail, Velta Addison, DO   buPROPion (WELLBUTRIN XL) 150 MG 24 hr tablet Take 300 mg by mouth daily.    [provider]  DULoxetine (CYMBALTA) 20 MG capsule Take 1 capsule (20 mg total) by mouth daily. Patient taking differently: Take 20 mg by mouth daily after supper.  07/09/14   Elgergawy, Silver Huguenin, MD  NOVOLIN R RELION 100 UNIT/ML injection Inject 3-13 Units into the skin 3 (three) times daily with meals as needed for high blood sugar.  06/13/14   [provider]  SENSIPAR 30 MG tablet Take 1 tablet by mouth daily. 07/23/15   [provider]  sevelamer carbonate (RENVELA) 800 MG tablet Take 1 tablet (800 mg total) by mouth 3 (three) times daily with meals. 08/23/14   Conrad Brewer, MD   Liver Function Tests Recent Labs  Lab 02/07/18 2145 02/08/18 0022  AST 18 25  ALT 13* 17  ALKPHOS 52 70  BILITOT 0.6 1.2  PROT 4.2* 6.0*  ALBUMIN 2.0* 2.7*   Recent Labs  Lab 02/07/18 2145  LIPASE 25   CBC Recent Labs  Lab 02/07/18 2145 02/08/18 0022 02/08/18 0255  WBC 7.2 10.8* 13.0*  NEUTROABS 6.1 8.7*  --   HGB 8.2* 9.9* 10.8*  HCT 26.5* 32.1* 35.1*  MCV 94.6 93.9 93.4  PLT 108* 136* 332*   Basic Metabolic Panel Recent Labs  Lab 02/07/18 2145 02/08/18 0022 02/08/18 0255  NA 145 140 139  K 2.3* 5.0 4.4  CL 118* 103 102  CO2 18* 24 23  GLUCOSE 150* 185* 190*  BUN 25* 35* 40*  CREATININE 4.56* 6.98* 7.25*  CALCIUM 5.9* 9.1 9.0   Iron/TIBC/Ferritin/ %Sat    Component Value Date/Time   IRON 10 (L) 02/08/2018 0022   TIBC 144 (L) 02/08/2018 0022   FERRITIN 1,090 (H) 02/08/2018 0022   IRONPCTSAT 7 (L) 02/08/2018 0022    Vitals:   02/08/18 0815 02/08/18 0828 02/08/18 0900 02/08/18 1100  BP: 121/70  (!) 105/54 (!) 95/52  Pulse: 83  78 78  Resp: 19  19 18   Temp:  98.3 F (36.8 C)    TempSrc:  Oral    SpO2: 97%  92% 97%  Weight:      Height:       Exam Gen appears tired lethargic ox 3 sluggish responses No rash, cyanosis or gangrene Sclera anicteric, throat clear  and moist  +mild jvd lying flat Chest fine harsh rales L base, R side clear no wheezing or bronchial bs RRR no MRG Abd soft ntnd no mass or ascites +bs GU normal male MS no joint effusions or deformity Ext no leg or arm edema, no wounds or ulcers Neuro is alert, Ox 3 , nf no asterixis   na 139 k 4.4  Bun 40 creat 7.25 co2 23  Alb 2.7  lfts ok  Mag 1.0 (low)  nh3 9 Trop ,  0.03  tsat 7%  Ferritin 1090  wbc 13k hb 10.8 plt 131   cxr 5/28 > diffuse interstitial pattern same as last study, question edema vs scarring    home meds:  -norvasc 10  -paxil 10/ cymbalta 20/ wellbutrin xl 300  -ecasa/ statin/ phoslo/ mvi/ ppi/ zantca/ sensipar/ renvela  -novolin insulin tid sq  Dialysis: nw tts  94.5kg  2k/3.5 bath  rua avf heparin 6000  - venofer 50/ thurs  - mircera 50ug  last 5/18    Impression: 1 fevers/ hypotension - prob sepsis w basilar pna per ct scan 2 esrd on hd - no missed hd, plan hd tomorrow hopefully bp's will be better by tomorrow 3 volume - no gross excess, interstitial pattern on cxr not sure old or fluid or infection. Dc ivf's 4 depression - on mult meds 5 hypertension - hold norvasc for now 6 dm on insulin 7 mbd ckd cont meds 8 anemia of ckd -  Hb stable > 10 no need esa for now   Plan - low bp's so will avoid hd for today, dc ivf's and plan dialysis tomorrow hopefully bp's will be better  Kelly Splinter MD [place] pager [phone]   02/08/2018, 1:26 PM

## 2018-02-08 NOTE — ED Notes (Signed)
Pt states still unable to urinate.

## 2018-02-08 NOTE — ED Notes (Signed)
Patient bladder scanned, 0 mL. RN notified.

## 2018-02-08 NOTE — Progress Notes (Signed)
PROGRESS NOTE   Blake [name]  QJF:[id number]    DOB: 09-17-1945    DOA: 02/07/2018  PCP: [name], MD   I have briefly reviewed patients previous medical records in [facility].  Brief Narrative:  72 year old male, lives with family, PMH of ESRD on TTS HD, HTN, HLD, DM 2, GERD, depression, chronic diastolic CHF, presented to ED with altered mental status, nausea, vomiting and generalized weakness.  As per EMS, hypoxic at 87% on room air, initial blood pressure 72/44 which improved to 98/50 after 2 L normal saline IV.  Admitted for suspected sepsis.   Assessment & Plan:   Principal Problem:   Sepsis ([place]) Active Problems:   ESRD on dialysis ([place])   Depression   Encephalopathy, metabolic   Nausea and vomiting   Thrombocytopenia ([name])   Type II diabetes mellitus with renal manifestations ([place])   Hypocalcemia   Hypokalemia   Anemia   Hypomagnesemia   Suspected sepsis from HCAP: Met sepsis criteria on admission with hypotension, fever and tachypnea.  Lactate elevated at 2.13 and trended up to 4.  Hypotension responded to IV fluids.  Source of infection not clear.  CT abdomen and pelvis showed bibasilar infiltration concerning for pneumonia, aspiration pneumonia possible due to altered mental status.  Does make intermittent urine.  Empirically started on IV vancomycin and Zosyn.  Blood cultures x2: Negative to date.  Procalcitonin elevated: 51.6.  Urine microscopy not indicative of UTI.  Status post IV fluid resuscitation, now off due to ESRD.  Acute toxic metabolic encephalopathy: CT head negative for acute findings.  No focal neurological deficits.  Likely related to acute infection.  Improved or even resolved on my evaluation.  Thrombocytopenia: May be related to infection.  Improved compared to initial arrival.  Low index of suspicion for TTP.  ESRD on TTS HD: Reported completing HD on 5/28.  Nephrology consulted.  Depression: Holding oral medications due to  recent altered mental status.  Consider resuming in a.m.  Essential hypertension: Antihypertensives held due to initial hypotension.  Hypotension improved.  Type II DM with renal complications: Last S9H 6 on 07/02/2014.  Mildly uncontrolled and fluctuating.  Continue SSI.  Hypokalemia: Replaced.  Hypomagnesemia: Replace and follow.  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  Stable.  Follow CBCs periodically.  Nausea and vomiting: Unclear etiology.  CT abdomen and pelvis without acute findings.  Seems to have improved.   DVT prophylaxis: SCDs Code Status: Full Family Communication: None at bedside Disposition: DC home pending clinical improvement   Consultants:  Nephrology  Procedures:  None  Antimicrobials:  IV vancomycin and Zosyn   Subjective: Seen this morning in the ED.  Denied complaints.  No fever, chills, cough, headache, earache, nausea, vomiting, abdominal pain, diarrhea reported.  Stated that his last BM was on 5/28.  Reported that he completed dialysis on day prior to admission.  As per RN, no acute issues noted.  ROS: As above.  Objective:  Vitals:   02/08/18 1600 02/08/18 1700 02/08/18 1800 02/08/18 1900  BP: 99/60 (!) 121/92 (!) 98/50 96/73  Pulse: 73 83 83 87  Resp: _0 Temp:      TempSrc:      SpO2: 95% 100% 96% 98%  Weight:      Height:        Examination:  General exam: Does not elderly male, moderately built and nourished lying comfortably propped up in bed.  Oral mucosa with borderline hydration. Respiratory system: Slightly diminished breath  sounds in the bases but otherwise clear to auscultation. Respiratory effort normal. Cardiovascular system: S1 & S2 heard, RRR. No JVD, murmurs, rubs, gallops or clicks. No pedal edema.  Telemetry personally reviewed: Sinus rhythm. Gastrointestinal system: Abdomen is nondistended, soft and nontender. No organomegaly or masses felt. Normal bowel sounds heard. Central nervous system: Alert and oriented x3.  No focal neurological deficits. Extremities: Symmetric 5 x 5 power.  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  Right great toe amputated. Skin: No rashes, lesions or ulcers Psychiatry: Judgement and insight appear normal. Mood & affect appropriate.     Data Reviewed: I have personally reviewed following labs and imaging studies  CBC: Recent Labs  Lab 02/07/18 2145 02/08/18 0022 02/08/18 0255  WBC 7.2 10.8* 13.0*  NEUTROABS 6.1 8.7*  --   HGB 8.2* 9.9* 10.8*  HCT 26.5* 32.1* 35.1*  MCV 94.6 93.9 93.4  PLT 108* 136* 511*   Basic Metabolic Panel: Recent Labs  Lab 02/07/18 2145 02/07/18 2345 02/08/18 0022 02/08/18 0255  NA 145  --  140 139  K 2.3*  --  5.0 4.4  CL 118*  --  103 102  CO2 18*  --  24 23  GLUCOSE 150*  --  185* 190*  BUN 25*  --  35* 40*  CREATININE 4.56*  --  6.98* 7.25*  CALCIUM 5.9*  --  9.1 9.0  MG  --  1.0*  --  1.6*   Liver Function Tests: Recent Labs  Lab 02/07/18 2145 02/08/18 0022  AST 18 25  ALT 13* 17  ALKPHOS 52 70  BILITOT 0.6 1.2  PROT 4.2* 6.0*  ALBUMIN 2.0* 2.7*   Coagulation Profile: Recent Labs  Lab 02/07/18 2145  INR 1.47   Cardiac Enzymes: Recent Labs  Lab 02/08/18 0255  TROPONINI <0.03   HbA1C: No results for input(s): HGBA1C in the last 72 hours. CBG: Recent Labs  Lab 02/07/18 2128 02/08/18 0138 02/08/18 1344  GLUCAP 204* 169* 117*    Recent Results (from the past 240 hour(s))  Blood culture (routine x 2)     Status: None (Preliminary result)   Collection Time: 02/07/18  9:35 PM  Result Value Ref Range Status   Specimen Description BLOOD LEFT ANTECUBITAL  Final   Special Requests   Final    BOTTLES DRAWN AEROBIC AND ANAEROBIC Blood Culture adequate volume   Culture   Final    NO GROWTH < 24 HOURS Performed at [facility], [place] [address] [place], [place] [address]    Report Status PENDING  Incomplete  Blood culture (routine x 2)     Status: None (Preliminary result)   Collection Time:  02/07/18  9:50 PM  Result Value Ref Range Status   Specimen Description BLOOD LEFT HAND  Final   Special Requests   Final    BOTTLES DRAWN AEROBIC AND ANAEROBIC Blood Culture adequate volume   Culture   Final    NO GROWTH < 24 HOURS Performed at [facility], [place] [address], [place],  [address]    Report Status PENDING  Incomplete         Radiology Studies: Ct Abdomen Pelvis Wo Contrast  Result Date: 02/08/2018 CLINICAL DATA:  Acute onset of generalized weakness, nausea and vomiting. EXAM: CT ABDOMEN AND PELVIS WITHOUT CONTRAST TECHNIQUE: Multidetector CT imaging of the abdomen and pelvis was performed following the standard protocol without IV contrast. COMPARISON:  Right upper quadrant ultrasound performed 04/21/2016 FINDINGS: Lower chest:  Bibasilar airspace opacities raise concern for pneumonia, possibly atypical in nature. Underlying mild fibrotic change is noted. The visualized portions of the mediastinum are grossly unremarkable. Scattered coronary artery calcifications are seen. Hepatobiliary: The liver is unremarkable in appearance. The gallbladder is unremarkable in appearance. The common bile duct remains normal in caliber. Pancreas: The pancreas is within normal limits. Spleen: The spleen is unremarkable in appearance. Adrenals/Urinary Tract: The adrenal glands are unremarkable in appearance. Moderate bilateral renal atrophy is noted. Scattered bilateral renal cysts are seen, hypodense and hyperdense in nature. Nonspecific perinephric stranding is noted bilaterally. There is no evidence of hydronephrosis. No renal or ureteral stones are identified Stomach/Bowel: The stomach is unremarkable in appearance. The small bowel is within normal limits. The appendix is normal in caliber, without evidence of appendicitis. The colon is unremarkable in appearance. Vascular/Lymphatic: The abdominal aorta is unremarkable in appearance. The inferior vena cava is grossly unremarkable. No  retroperitoneal lymphadenopathy is seen. No pelvic sidewall lymphadenopathy is identified. Reproductive: The bladder is decompressed. Soft tissue inflammation about the bladder could reflect cystitis. The prostate is enlarged, measuring 5.2 cm in transverse dimension. Other: No additional soft tissue abnormalities are seen. Musculoskeletal: No acute osseous abnormalities are identified. Vacuum phenomenon is noted at L5-S1. The visualized musculature is unremarkable in appearance. IMPRESSION: 1. Bibasilar airspace opacities raise concern for pneumonia, possibly atypical in nature. Underlying mild fibrotic change noted. 2. Soft tissue inflammation about the bladder could reflect cystitis. 3. Moderate bilateral renal atrophy noted. Scattered bilateral renal cysts, hypodense and hyperdense in nature. 4. Scattered coronary artery calcifications seen. 5. Enlarged prostate noted. Electronically Signed   By: Garald Balding M.D.   On: 02/08/2018 01:24   Ct Head Wo Contrast  Result Date: 02/08/2018 CLINICAL DATA:  Acute onset of generalized weakness and lethargy. EXAM: CT HEAD WITHOUT CONTRAST TECHNIQUE: Contiguous axial images were obtained from the base of the skull through the vertex without intravenous contrast. COMPARISON:  CT of the head performed 08/01/2015 FINDINGS: Brain: No evidence of acute infarction, hemorrhage, hydrocephalus, extra-axial collection or mass lesion / mass effect. Prominence of the ventricles and sulci reflects mild cortical volume loss. Scattered periventricular and subcortical white matter change likely reflects small vessel ischemic microangiopathy. The brainstem and fourth ventricle are within normal limits. The basal ganglia are unremarkable in appearance. The cerebral hemispheres demonstrate grossly normal gray-white differentiation. No mass effect or midline shift is seen. Vascular: No hyperdense vessel or unexpected calcification. Skull: There is no evidence of fracture; visualized  osseous structures are unremarkable in appearance. Sinuses/Orbits: The orbits are within normal limits. The paranasal sinuses and mastoid air cells are well-aerated. Other: No significant soft tissue abnormalities are seen. IMPRESSION: 1. No acute intracranial pathology seen on CT. 2. Mild cortical volume loss and scattered small vessel ischemic microangiopathy. Electronically Signed   By: Garald Balding M.D.   On: 02/08/2018 01:17   Dg Chest Portable 1 View  Result Date: 02/07/2018 CLINICAL DATA:  Weakness.  Vomiting. EXAM: PORTABLE CHEST 1 VIEW COMPARISON:  March 02, 2017 FINDINGS: Stable cardiomegaly. The hila and mediastinum are unchanged. No pneumothorax. Diffuse interstitial opacities in the lungs are similar to the previous study. No nodule or mass. No focal infiltrate. IMPRESSION: Cardiomegaly. Persistent interstitial opacities could represent recurrent edema versus interstitial lung disease. Recommend clinical correlation. Electronically Signed   By: Dorise Bullion III M.D   On: 02/07/2018 22:14        Scheduled Meds: . Derrill Memo ON 02/09/2018] Chlorhexidine Gluconate Cloth  6 each Topical  Q0600  . insulin aspart  0-5 Units Subcutaneous QHS  . insulin aspart  0-9 Units Subcutaneous TID WC   Continuous Infusions: . sodium chloride    . famotidine (PEPCID) IV Stopped (02/08/18 1734)  . piperacillin-tazobactam (ZOSYN)  IV Stopped (02/08/18 1239)  . [START ON 02/09/2018] vancomycin       LOS: 0 days     Vernell Leep, MD, FACP, [facility]. [facility] Pager [phone]  If 7PM-7AM, please contact night-coverage [email or web address] Password [email or web address] 02/08/2018, 7:20 PM

## 2018-02-09 LAB — CBC
HEMATOCRIT: 31.4 % — AB (ref 39.0–52.0)
HEMOGLOBIN: 10 g/dL — AB (ref 13.0–17.0)
MCH: 29 pg (ref 26.0–34.0)
MCHC: 31.8 g/dL (ref 30.0–36.0)
MCV: 91 fL (ref 78.0–100.0)
Platelets: 134 10*3/uL — ABNORMAL LOW (ref 150–400)
RBC: 3.45 MIL/uL — ABNORMAL LOW (ref 4.22–5.81)
RDW: 13.7 % (ref 11.5–15.5)
WBC: 13.6 10*3/uL — ABNORMAL HIGH (ref 4.0–10.5)

## 2018-02-09 LAB — RENAL FUNCTION PANEL
ALBUMIN: 2.7 g/dL — AB (ref 3.5–5.0)
Anion gap: 12 (ref 5–15)
BUN: 62 mg/dL — AB (ref 6–20)
CHLORIDE: 104 mmol/L (ref 101–111)
CO2: 23 mmol/L (ref 22–32)
CREATININE: 9.42 mg/dL — AB (ref 0.61–1.24)
Calcium: 8.4 mg/dL — ABNORMAL LOW (ref 8.9–10.3)
GFR calc Af Amer: 6 mL/min — ABNORMAL LOW (ref >60)
GFR, EST NON AFRICAN AMERICAN: 5 mL/min — AB (ref >60)
Glucose, Bld: 97 mg/dL (ref 65–99)
PHOSPHORUS: 4.4 mg/dL (ref 2.5–4.6)
Potassium: 4.6 mmol/L (ref 3.5–5.1)
Sodium: 139 mmol/L (ref 135–145)

## 2018-02-09 LAB — GLUCOSE, CAPILLARY
GLUCOSE-CAPILLARY: 126 mg/dL — AB (ref 65–99)
Glucose-Capillary: 122 mg/dL — ABNORMAL HIGH (ref 65–99)
Glucose-Capillary: 107 mg/dL — ABNORMAL HIGH (ref 65–99)

## 2018-02-09 LAB — MRSA PCR SCREENING: MRSA by PCR: NEGATIVE

## 2018-02-09 LAB — MAGNESIUM: Magnesium: 1.8 mg/dL (ref 1.7–2.4)

## 2018-02-09 MED ORDER — ASPIRIN EC 81 MG PO TBEC
81.0000 mg | DELAYED_RELEASE_TABLET | Freq: Every day | ORAL | Status: DC
  Administered 2018-02-09 – 2018-02-13 (×5): 81 mg via ORAL
  Filled 2018-02-09 (×5): qty 1

## 2018-02-09 MED ORDER — ATORVASTATIN CALCIUM 20 MG PO TABS
20.0000 mg | ORAL_TABLET | Freq: Every day | ORAL | Status: DC
  Administered 2018-02-09 – 2018-02-13 (×5): 20 mg via ORAL
  Filled 2018-02-09 (×5): qty 1

## 2018-02-09 MED ORDER — PANTOPRAZOLE SODIUM 20 MG PO TBEC
20.0000 mg | DELAYED_RELEASE_TABLET | Freq: Every day | ORAL | Status: DC
  Administered 2018-02-09 – 2018-02-13 (×5): 20 mg via ORAL
  Filled 2018-02-09 (×5): qty 1

## 2018-02-09 MED ORDER — RENA-VITE PO TABS
1.0000 | ORAL_TABLET | Freq: Every day | ORAL | Status: DC
  Administered 2018-02-09 – 2018-02-13 (×5): 1 via ORAL
  Filled 2018-02-09 (×5): qty 1

## 2018-02-09 NOTE — Progress Notes (Signed)
Patient arrived into the unit at the beginning of shift 7pm. Patient is alert and oriented, able to answer questions. Assessment completed with the ongoing nurse and documented in epic. No acute distress noted. Will continue to monitor. Order clarification obtained for telemetry order.

## 2018-02-09 NOTE — Progress Notes (Signed)
Attempted to wean oxygen, patient desaturates to 85% on room air, oxygen reapplied at 2L.

## 2018-02-09 NOTE — Progress Notes (Signed)
PROGRESS NOTE   Blake [name]  SUP:[id number]    DOB: 03/09/1946    DOA: 02/07/2018  PCP: [name], MD   I have briefly reviewed patients previous medical records in [facility].  Brief Narrative:  72 year old male, lives with family, PMH of ESRD on TTS HD, HTN, HLD, DM 2, GERD, depression, chronic diastolic CHF, presented to ED with altered mental status, nausea, vomiting and generalized weakness.  As per EMS, hypoxic at 87% on room air, initial blood pressure 72/44 which improved to 98/50 after 2 L normal saline IV.  Admitted for suspected sepsis.   Assessment & Plan:   Principal Problem:   Sepsis ([place]) Active Problems:   ESRD on dialysis ([place])   Depression   Encephalopathy, metabolic   Nausea and vomiting   Thrombocytopenia ([name])   Type II diabetes mellitus with renal manifestations ([place])   Hypocalcemia   Hypokalemia   Anemia   Hypomagnesemia   Suspected sepsis from HCAP: Met sepsis criteria on admission with hypotension, fever and tachypnea.  Lactate elevated at 2.13 and trended up to 4.  Hypotension responded to IV fluids.  CT abdomen and pelvis showed bibasilar infiltration concerning for pneumonia, aspiration pneumonia possible due to altered mental status.  Does make intermittent urine.  Empirically started on IV vancomycin and Zosyn.  Blood cultures x2: Negative to date.  Procalcitonin elevated: 51.6.  Urine microscopy not indicative of UTI.  MRSA PCR negative.  Discontinued vancomycin.  Improved.  Acute toxic metabolic encephalopathy: CT head negative for acute findings.  No focal neurological deficits.  Likely related to acute infection.  Resolved and mental status likely at or close to baseline.  Thrombocytopenia: May be related to infection.  Improved compared to initial arrival.  Low index of suspicion for TTP.  Platelets have been stable for the last 3 days.  ESRD on TTS HD: Nephrology follow-up appreciated.  Patient underwent hemodialysis on  5/30.  Depression: Holding oral medications due to recent altered mental status.  Some concern that these medications may also be contributing to altered mental status.  Will reassess in a.m. or at discharge to determine resuming with changes.  Essential hypertension: Antihypertensives held due to initial hypotension.  Hypotension improved but still has soft blood pressures.  Type II DM with renal complications: Last P9Y 6 on 07/02/2014.  Mildly uncontrolled and fluctuating.  Continue SSI.  No changes.  Hypokalemia: Replaced.  Hypomagnesemia: Replaced.  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  Stable.  Follow CBCs periodically.  Nausea and vomiting: Unclear etiology.  CT abdomen and pelvis without acute findings.  Resolved.  Diet was ordered since 5/29 night but apparently has not received any diet yet.  Discussed with HD nursing to start diet.   DVT prophylaxis: SCDs Code Status: Full Family Communication: None at bedside Disposition: DC home pending clinical improvement   Consultants:  Nephrology  Procedures:  HD  Antimicrobials:  Discontinued IV vancomycin. IV Zosyn.   Subjective: Patient seen this morning at HD.  Reports chronic right distal upper extremity pain and bilateral feet pain which he states are not new.  Denies nausea, vomiting, abdominal pain.  Denies issues with swallowing solids or liquids.  No cough, chest pain or dyspnea reported.  ROS: As above.  Objective:  Vitals:   02/09/18 0646 02/09/18 0800 02/09/18 1423 02/09/18 1430  BP: (!) 105/54 (!) 110/59 (!) 108/56   Pulse: 85 85 73   Resp:  18 18   Temp:  98.6 F (37 C) 98.1 F (  36.7 C)   TempSrc:  Oral Oral   SpO2:  97% 99% 95%  Weight:  99.7 kg (219 lb 12.8 oz) 96.7 kg (213 lb 3 oz)   Height:   _0  (1.727 m)     Examination:  General exam: Pleasant elderly male, moderately built and nourished lying comfortably propped up in bed.  Oral mucosa moist.  Throat exam without acute findings. Respiratory  system: Occasional basal crackles but otherwise clear to auscultation.  No increased work of breathing. Cardiovascular system: S1 & S2 heard, RRR. No JVD, murmurs, rubs, gallops or clicks. No pedal edema.  Stable. Gastrointestinal system: Abdomen is nondistended, soft and nontender. No organomegaly or masses felt. Normal bowel sounds heard.  Stable. Central nervous system: Alert and oriented x3. No focal neurological deficits.  Stable. Extremities: Symmetric 5 x 5 power.  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  Right great toe amputated.  No acute findings noted to explain patient's pain symptoms. Skin: No rashes, lesions or ulcers Psychiatry: Judgement and insight appear normal. Mood & affect appropriate.     Data Reviewed: I have personally reviewed following labs and imaging studies  CBC: Recent Labs  Lab 02/07/18 2145 02/08/18 0022 02/08/18 0255 02/09/18 0642  WBC 7.2 10.8* 13.0* 13.6*  NEUTROABS 6.1 8.7*  --   --   HGB 8.2* 9.9* 10.8* 10.0*  HCT 26.5* 32.1* 35.1* 31.4*  MCV 94.6 93.9 93.4 91.0  PLT 108* 136* 131* 517*   Basic Metabolic Panel: Recent Labs  Lab 02/07/18 2145 02/07/18 2345 02/08/18 0022 02/08/18 0255 02/09/18 0642  NA 145  --  140 139 139  K 2.3*  --  5.0 4.4 4.6  CL 118*  --  103 102 104  CO2 18*  --  _1 GLUCOSE 150*  --  185* 190* 97  BUN 25*  --  35* 40* 62*  CREATININE 4.56*  --  6.98* 7.25* 9.42*  CALCIUM 5.9*  --  9.1 9.0 8.4*  MG  --  1.0*  --  1.6* 1.8  PHOS  --   --   --   --  4.4   Liver Function Tests: Recent Labs  Lab 02/07/18 2145 02/08/18 0022 02/09/18 0642  AST 18 25  --   ALT 13* 17  --   ALKPHOS 52 70  --   BILITOT 0.6 1.2  --   PROT 4.2* 6.0*  --   ALBUMIN 2.0* 2.7* 2.7*   Coagulation Profile: Recent Labs  Lab 02/07/18 2145  INR 1.47   Cardiac Enzymes: Recent Labs  Lab 02/08/18 0255  TROPONINI <0.03   HbA1C: No results for input(s): HGBA1C in the last 72 hours. CBG: Recent Labs  Lab  02/07/18 2128 02/08/18 0138 02/08/18 1344 02/08/18 2154 02/09/18 1421  GLUCAP 204* 169* 117* 102* 126*    Recent Results (from the past 240 hour(s))  Blood culture (routine x 2)     Status: None (Preliminary result)   Collection Time: 02/07/18  9:35 PM  Result Value Ref Range Status   Specimen Description BLOOD LEFT ANTECUBITAL  Final   Special Requests   Final    BOTTLES DRAWN AEROBIC AND ANAEROBIC Blood Culture adequate volume   Culture   Final    NO GROWTH 2 DAYS Performed at [facility], [place] [address], [place], [place] [address]    Report Status PENDING  Incomplete  Blood culture (routine x 2)     Status: None (Preliminary result)  Collection Time: 02/07/18  9:50 PM  Result Value Ref Range Status   Specimen Description BLOOD LEFT HAND  Final   Special Requests   Final    BOTTLES DRAWN AEROBIC AND ANAEROBIC Blood Culture adequate volume   Culture   Final    NO GROWTH 2 DAYS Performed at [facility], [address], [place], [place] [address]    Report Status PENDING  Incomplete  MRSA PCR Screening     Status: None   Collection Time: 02/08/18 11:23 PM  Result Value Ref Range Status   MRSA by PCR NEGATIVE NEGATIVE Final    Comment:        The GeneXpert MRSA Assay (FDA approved for NASAL specimens only), is one component of a comprehensive MRSA colonization surveillance program. It is not intended to diagnose MRSA infection nor to guide or monitor treatment for MRSA infections. Performed at [facility], [place] [address], [place], [place] [address]          Radiology Studies: Ct Abdomen Pelvis Wo Contrast  Result Date: 02/08/2018 CLINICAL DATA:  Acute onset of generalized weakness, nausea and vomiting. EXAM: CT ABDOMEN AND PELVIS WITHOUT CONTRAST TECHNIQUE: Multidetector CT imaging of the abdomen and pelvis was performed following the standard protocol without IV contrast. COMPARISON:  Right upper quadrant ultrasound performed 04/21/2016  FINDINGS: Lower chest: Bibasilar airspace opacities raise concern for pneumonia, possibly atypical in nature. Underlying mild fibrotic change is noted. The visualized portions of the mediastinum are grossly unremarkable. Scattered coronary artery calcifications are seen. Hepatobiliary: The liver is unremarkable in appearance. The gallbladder is unremarkable in appearance. The common bile duct remains normal in caliber. Pancreas: The pancreas is within normal limits. Spleen: The spleen is unremarkable in appearance. Adrenals/Urinary Tract: The adrenal glands are unremarkable in appearance. Moderate bilateral renal atrophy is noted. Scattered bilateral renal cysts are seen, hypodense and hyperdense in nature. Nonspecific perinephric stranding is noted bilaterally. There is no evidence of hydronephrosis. No renal or ureteral stones are identified Stomach/Bowel: The stomach is unremarkable in appearance. The small bowel is within normal limits. The appendix is normal in caliber, without evidence of appendicitis. The colon is unremarkable in appearance. Vascular/Lymphatic: The abdominal aorta is unremarkable in appearance. The inferior vena cava is grossly unremarkable. No retroperitoneal lymphadenopathy is seen. No pelvic sidewall lymphadenopathy is identified. Reproductive: The bladder is decompressed. Soft tissue inflammation about the bladder could reflect cystitis. The prostate is enlarged, measuring 5.2 cm in transverse dimension. Other: No additional soft tissue abnormalities are seen. Musculoskeletal: No acute osseous abnormalities are identified. Vacuum phenomenon is noted at L5-S1. The visualized musculature is unremarkable in appearance. IMPRESSION: 1. Bibasilar airspace opacities raise concern for pneumonia, possibly atypical in nature. Underlying mild fibrotic change noted. 2. Soft tissue inflammation about the bladder could reflect cystitis. 3. Moderate bilateral renal atrophy noted. Scattered bilateral  renal cysts, hypodense and hyperdense in nature. 4. Scattered coronary artery calcifications seen. 5. Enlarged prostate noted. Electronically Signed   By: Garald Balding M.D.   On: 02/08/2018 01:24   Ct Head Wo Contrast  Result Date: 02/08/2018 CLINICAL DATA:  Acute onset of generalized weakness and lethargy. EXAM: CT HEAD WITHOUT CONTRAST TECHNIQUE: Contiguous axial images were obtained from the base of the skull through the vertex without intravenous contrast. COMPARISON:  CT of the head performed 08/01/2015 FINDINGS: Brain: No evidence of acute infarction, hemorrhage, hydrocephalus, extra-axial collection or mass lesion / mass effect. Prominence of the ventricles and sulci reflects mild cortical volume  loss. Scattered periventricular and subcortical white matter change likely reflects small vessel ischemic microangiopathy. The brainstem and fourth ventricle are within normal limits. The basal ganglia are unremarkable in appearance. The cerebral hemispheres demonstrate grossly normal gray-white differentiation. No mass effect or midline shift is seen. Vascular: No hyperdense vessel or unexpected calcification. Skull: There is no evidence of fracture; visualized osseous structures are unremarkable in appearance. Sinuses/Orbits: The orbits are within normal limits. The paranasal sinuses and mastoid air cells are well-aerated. Other: No significant soft tissue abnormalities are seen. IMPRESSION: 1. No acute intracranial pathology seen on CT. 2. Mild cortical volume loss and scattered small vessel ischemic microangiopathy. Electronically Signed   By: Garald Balding M.D.   On: 02/08/2018 01:17   Dg Chest Portable 1 View  Result Date: 02/07/2018 CLINICAL DATA:  Weakness.  Vomiting. EXAM: PORTABLE CHEST 1 VIEW COMPARISON:  March 02, 2017 FINDINGS: Stable cardiomegaly. The hila and mediastinum are unchanged. No pneumothorax. Diffuse interstitial opacities in the lungs are similar to the previous study. No nodule or  mass. No focal infiltrate. IMPRESSION: Cardiomegaly. Persistent interstitial opacities could represent recurrent edema versus interstitial lung disease. Recommend clinical correlation. Electronically Signed   By: Dorise Bullion III M.D   On: 02/07/2018 22:14        Scheduled Meds: . Chlorhexidine Gluconate Cloth  6 each Topical Q0600  . insulin aspart  0-5 Units Subcutaneous QHS  . insulin aspart  0-9 Units Subcutaneous TID WC   Continuous Infusions: . famotidine (PEPCID) IV 20 mg (02/09/18 1416)  . piperacillin-tazobactam (ZOSYN)  IV Stopped (02/09/18 0211)  . vancomycin       LOS: 1 day     Vernell Leep, MD, FACP, [facility]. [facility] Pager [phone]  If 7PM-7AM, please contact night-coverage [email or web address] Password [facility] 02/09/2018, 4:49 PM

## 2018-02-09 NOTE — Progress Notes (Addendum)
Subjective:  On hd and tolerating . Feels better   Objective Vital signs in last 24 hours: Vitals:   02/08/18 1947 02/09/18 0443 02/09/18 0646 02/09/18 0800  BP: (!) 119/54 (!) 99/41 (!) 105/54 (!) 110/59  Pulse: 78 90 85 85  Resp: 18 20  18   Temp: 99 F (37.2 C) 98.5 F (36.9 C)  98.6 F (37 C)  TempSrc:    Oral  SpO2: 92% (!) 84%  97%  Weight:    99.7 kg (219 lb 12.8 oz)  Height:       Weight change:   Physical Exam: General: alert , lethargic and more alert than yesterdays  Heart: RRR no mrg Lungs:L coarse BS  Non labored breathing  Abdomen: BS pos soft , Nt,nD Extremities: No pedal edema  Dialysis Access: patent  on hd  RUA AVF with  2 aneurysmal areas   Dialysis: nw tts  94.5kg  2k/3.5 bath  rua avf heparin 6000  - venofer 50/ thurs  - mircera 50ug  last 5/18  Problem/Plan: 1. ESRD - HD TTS  2. Fevers/ Hypotension / sepsis /Pna on ct scan  - bp stable on Hd .on zosyn /vanc   3. Volume - bp stable on hd ( norvasc on hold ) 4. Anemia - HGB 10 no esa for trend  5. Secondary hyperparathyroidism -  Not  Binders currently/  phos= 4.4  6. Depression- on multiple meds as op listed / Luiz Iron of lethargy ??  Cymbalata, Paxil,.wellbutin, on hold   7. DM type 2   Ernest Haber, PA-C [facility] [phone] 02/09/2018,12:20 PM  LOS: 1 day   Pt seen, examined and agree w A/P as above.  Kelly Splinter MD [facility] pager [phone]   02/09/2018, 1:22 PM    Labs: Basic Metabolic Panel: Recent Labs  Lab 02/08/18 0022 02/08/18 0255 02/09/18 0642  NA 140 139 139  K 5.0 4.4 4.6  CL 103 102 104  CO2 24 23 23   GLUCOSE 185* 190* 97  BUN 35* 40* 62*  CREATININE 6.98* 7.25* 9.42*  CALCIUM 9.1 9.0 8.4*  PHOS  --   --  4.4   Liver Function Tests: Recent Labs  Lab 02/07/18 2145 02/08/18 0022 02/09/18 0642  AST 18 25  --   ALT 13* 17  --   ALKPHOS 52 70  --   BILITOT 0.6 1.2  --   PROT 4.2* 6.0*  --   ALBUMIN 2.0* 2.7* 2.7*    Recent Labs  Lab 02/07/18 2145  LIPASE 25   Recent Labs  Lab 02/07/18 2150  AMMONIA 9   CBC: Recent Labs  Lab 02/07/18 2145 02/08/18 0022 02/08/18 0255 02/09/18 0642  WBC 7.2 10.8* 13.0* 13.6*  NEUTROABS 6.1 8.7*  --   --   HGB 8.2* 9.9* 10.8* 10.0*  HCT 26.5* 32.1* 35.1* 31.4*  MCV 94.6 93.9 93.4 91.0  PLT 108* 136* 131* 134*   Cardiac Enzymes: Recent Labs  Lab 02/08/18 0255  TROPONINI <0.03   CBG: Recent Labs  Lab 02/07/18 2128 02/08/18 0138 02/08/18 1344 02/08/18 2154  GLUCAP 204* 169* 117* 102*     Medications: . sodium chloride    . famotidine (PEPCID) IV Stopped (02/08/18 2215)  . piperacillin-tazobactam (ZOSYN)  IV Stopped (02/09/18 0211)  . vancomycin     . Chlorhexidine Gluconate Cloth  6 each Topical Q0600  . insulin aspart  0-5 Units Subcutaneous QHS  . insulin aspart  0-9 Units Subcutaneous TID  WC

## 2018-02-09 NOTE — Care Management Note (Addendum)
Case Management Note  Patient Details  Name: EWEL LONA MRN: [id number] Date of Birth: [birth year]  Subjective/Objective:            Admitted with Sepsis, unknown origin. Hx of DM,HTN, CHF,ESRD on HD.      Audry Riles  (Niece) Theron Arista (Sister)      [phone]        PCP: [name]  Action/Plan:  NCM following for disposition needs  Expected Discharge Date:                  Expected Discharge Plan:  Home/Self Care  In-House Referral:     Discharge planning Services  CM Consult  Post Acute Care Choice:    Choice offered to:     DME Arranged:    DME Agency:     HH Arranged:    HH Agency:     Status of Service:  In process, will continue to follow  If discussed at Long Length of Stay Meetings, dates discussed:    Additional Comments:  Sharin Mons, RN 02/09/2018, 3:48 PM

## 2018-02-09 NOTE — Progress Notes (Signed)
Patient remains alert and oriented x4. No acute distress noted. Report given to dialysis nurse. Patient has been transported to dialysis.

## 2018-02-10 DIAGNOSIS — N186 End stage renal disease: Secondary | ICD-10-CM | POA: Diagnosis not present

## 2018-02-10 DIAGNOSIS — Z992 Dependence on renal dialysis: Secondary | ICD-10-CM | POA: Diagnosis not present

## 2018-02-10 DIAGNOSIS — E1122 Type 2 diabetes mellitus with diabetic chronic kidney disease: Secondary | ICD-10-CM | POA: Diagnosis not present

## 2018-02-10 LAB — GLUCOSE, CAPILLARY
GLUCOSE-CAPILLARY: 253 mg/dL — AB (ref 65–99)
GLUCOSE-CAPILLARY: 133 mg/dL — AB (ref 65–99)
Glucose-Capillary: 183 mg/dL — ABNORMAL HIGH (ref 65–99)
Glucose-Capillary: 159 mg/dL — ABNORMAL HIGH (ref 65–99)

## 2018-02-10 MED ORDER — PAROXETINE HCL 10 MG PO TABS
10.0000 mg | ORAL_TABLET | Freq: Every day | ORAL | Status: DC
Start: 2018-02-10 — End: 2018-02-13
  Administered 2018-02-10 – 2018-02-13 (×4): 10 mg via ORAL
  Filled 2018-02-10 (×5): qty 1

## 2018-02-10 MED ORDER — SODIUM CHLORIDE 0.9 % IV SOLN
125.0000 mg | INTRAVENOUS | Status: DC
  Administered 2018-02-11: 125 mg via INTRAVENOUS
  Filled 2018-02-10 (×2): qty 10

## 2018-02-10 MED ORDER — NEPRO/CARBSTEADY PO LIQD
237.0000 mL | Freq: Two times a day (BID) | ORAL | Status: DC
  Administered 2018-02-10: 237 mL via ORAL

## 2018-02-10 MED ORDER — DOXERCALCIFEROL 4 MCG/2ML IV SOLN
1.0000 ug | INTRAVENOUS | Status: DC
  Administered 2018-02-11: 1 ug via INTRAVENOUS
  Filled 2018-02-10: qty 2

## 2018-02-10 MED ORDER — CHLORHEXIDINE GLUCONATE CLOTH 2 % EX PADS
6.0000 | MEDICATED_PAD | Freq: Every day | CUTANEOUS | Status: DC
  Administered 2018-02-12 – 2018-02-13 (×2): 6 via TOPICAL

## 2018-02-10 MED ORDER — DARBEPOETIN ALFA 60 MCG/0.3ML IJ SOSY
60.0000 ug | PREFILLED_SYRINGE | INTRAMUSCULAR | Status: DC
Start: 2018-02-11 — End: 2018-02-13
  Administered 2018-02-11: 60 ug via INTRAVENOUS
  Filled 2018-02-10: qty 0.3

## 2018-02-10 NOTE — Progress Notes (Signed)
PROGRESS NOTE   Blake [name]  ZDG:[id number]    DOB: 1945/10/02    DOA: 02/07/2018  PCP: [name], MD   I have briefly reviewed patients previous medical records in [facility].  Brief Narrative:  72 year old male, lives with family, PMH of ESRD on TTS HD, HTN, HLD, DM 2, GERD, depression, chronic diastolic CHF, presented to ED with altered mental status, nausea, vomiting and generalized weakness.  As per EMS, hypoxic at 87% on room air, initial blood pressure 72/44 which improved to 98/50 after 2 L normal saline IV.  Admitted for suspected sepsis.  Improved.   Assessment & Plan:   Principal Problem:   Sepsis ([place]) Active Problems:   ESRD on dialysis ([place])   Depression   Encephalopathy, metabolic   Nausea and vomiting   Thrombocytopenia ([name])   Type II diabetes mellitus with renal manifestations ([place])   Hypocalcemia   Hypokalemia   Anemia   Hypomagnesemia   Suspected sepsis from HCAP: Met sepsis criteria on admission with hypotension, fever and tachypnea.  Lactate elevated at 2.13 and trended up to 4.  Hypotension responded to IV fluids.  CT abdomen and pelvis showed bibasilar infiltration concerning for pneumonia, aspiration pneumonia possible due to altered mental status.  Does make intermittent urine.  Empirically started on IV vancomycin and Zosyn.  Blood cultures x2: Negative to date.  Procalcitonin elevated: 51.6.  Urine microscopy not indicative of UTI.  MRSA PCR negative.  Discontinued vancomycin.  Improved.  Consider switching to oral Augmentin in a.m.  Acute respiratory failure with hypoxia: Likely related to pneumonia.  Incentive spirometry.  Wean oxygen as tolerated.  Advised him that he may need to even go home on short.  Of home oxygen.  He verbalized understanding.  Acute toxic metabolic encephalopathy: CT head negative for acute findings.  No focal neurological deficits.  Likely related to acute infection.  Resolved and mental status likely at or  close to baseline.  Thrombocytopenia: May be related to infection.  Improved compared to initial arrival.  Low index of suspicion for TTP.  Platelets have been stable for the last 3 days.  ESRD on TTS HD: Nephrology follow-up appreciated.  Patient underwent hemodialysis on 5/30.  Plans for next dialysis on 6/1.  Depression: Home medications were initially held.  Pharmacy verified medications again and resumed prior home dose of Paxil.  Monitor closely.  Essential hypertension: Antihypertensives held due to initial hypotension.  Hypotension improved but still has soft blood pressures.  Type II DM with renal complications: Last R5J 6 on 07/02/2014.  Mildly uncontrolled and fluctuating.  Continue SSI.  No changes.  Hypokalemia: Replaced.  Hypomagnesemia: Replaced.  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  Stable.  Follow CBCs periodically.  Nausea and vomiting: Unclear etiology.  CT abdomen and pelvis without acute findings.  Resolved.  Tolerating diet.   DVT prophylaxis: SCDs Code Status: Full Family Communication: None at bedside Disposition: DC home pending clinical improvement, possibly tomorrow pending PT evaluation.   Consultants:  Nephrology  Procedures:  HD  Antimicrobials:  Discontinued IV vancomycin. IV Zosyn.   Subjective: Patient denies complaints.  No cough, dyspnea or chest pain reported.  Reports that he stays with his knees and ambulates with the help of cane.  Denies smoking.  Not on oxygen at home.  ROS: As above.  Objective:  Vitals:   02/09/18 2107 02/09/18 2200 02/10/18 0637 02/10/18 1353  BP: (!) 118/50  97/60 108/68  Pulse: 74  67 63  Resp: 18  18 18  Temp: 98.8 F (37.1 C)  99 F (37.2 C) 98.6 F (37 C)  TempSrc: Oral   Oral  SpO2: 90% 98% 91%   Weight:      Height:        Examination:  General exam: Pleasant elderly male, moderately built and nourished lying comfortably propped up in bed.  Oral mucosa moist.  Throat exam without acute  findings.  Patient sitting up in eating breakfast by himself. Respiratory system: Slightly diminished breath sounds in the bases with occasional basal crackles but otherwise clear to auscultation. Cardiovascular system: S1 & S2 heard, RRR. No JVD, murmurs, rubs, gallops or clicks. No pedal edema.  Telemetry personally reviewed: Sinus rhythm with occasional PACs. Gastrointestinal system: Abdomen is nondistended, soft and nontender. No organomegaly or masses felt. Normal bowel sounds heard.  Stable. Central nervous system: Alert and oriented x3. No focal neurological deficits.  Stable. Extremities: Symmetric 5 x 5 power.  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  Right great toe amputated.  No acute findings noted to explain patient's pain symptoms. Skin: No rashes, lesions or ulcers Psychiatry: Judgement and insight appear normal. Mood & affect appropriate.     Data Reviewed: I have personally reviewed following labs and imaging studies  CBC: Recent Labs  Lab 02/07/18 2145 02/08/18 0022 02/08/18 0255 02/09/18 0642  WBC 7.2 10.8* 13.0* 13.6*  NEUTROABS 6.1 8.7*  --   --   HGB 8.2* 9.9* 10.8* 10.0*  HCT 26.5* 32.1* 35.1* 31.4*  MCV 94.6 93.9 93.4 91.0  PLT 108* 136* 131* 629*   Basic Metabolic Panel: Recent Labs  Lab 02/07/18 2145 02/07/18 2345 02/08/18 0022 02/08/18 0255 02/09/18 0642  NA 145  --  140 139 139  K 2.3*  --  5.0 4.4 4.6  CL 118*  --  103 102 104  CO2 18*  --  '24 23 23  ' GLUCOSE 150*  --  185* 190* 97  BUN 25*  --  35* 40* 62*  CREATININE 4.56*  --  6.98* 7.25* 9.42*  CALCIUM 5.9*  --  9.1 9.0 8.4*  MG  --  1.0*  --  1.6* 1.8  PHOS  --   --   --   --  4.4   Liver Function Tests: Recent Labs  Lab 02/07/18 2145 02/08/18 0022 02/09/18 0642  AST 18 25  --   ALT 13* 17  --   ALKPHOS 52 70  --   BILITOT 0.6 1.2  --   PROT 4.2* 6.0*  --   ALBUMIN 2.0* 2.7* 2.7*   Coagulation Profile: Recent Labs  Lab 02/07/18 2145  INR 1.47   Cardiac  Enzymes: Recent Labs  Lab 02/08/18 0255  TROPONINI <0.03   HbA1C: No results for input(s): HGBA1C in the last 72 hours. CBG: Recent Labs  Lab 02/09/18 1714 02/09/18 2104 02/10/18 0823 02/10/18 1145 02/10/18 1651  GLUCAP 122* 107* 183* 159* 253*    Recent Results (from the past 240 hour(s))  Blood culture (routine x 2)     Status: None (Preliminary result)   Collection Time: 02/07/18  9:35 PM  Result Value Ref Range Status   Specimen Description BLOOD LEFT ANTECUBITAL  Final   Special Requests   Final    BOTTLES DRAWN AEROBIC AND ANAEROBIC Blood Culture adequate volume   Culture   Final    NO GROWTH 3 DAYS Performed at [facility], [place] [address], [place], [place] [address]  Report Status PENDING  Incomplete  Blood culture (routine x 2)     Status: None (Preliminary result)   Collection Time: 02/07/18  9:50 PM  Result Value Ref Range Status   Specimen Description BLOOD LEFT HAND  Final   Special Requests   Final    BOTTLES DRAWN AEROBIC AND ANAEROBIC Blood Culture adequate volume   Culture   Final    NO GROWTH 3 DAYS Performed at [facility], [address] [place], [place] [address]    Report Status PENDING  Incomplete  MRSA PCR Screening     Status: None   Collection Time: 02/08/18 11:23 PM  Result Value Ref Range Status   MRSA by PCR NEGATIVE NEGATIVE Final    Comment:        The GeneXpert MRSA Assay (FDA approved for NASAL specimens only), is one component of a comprehensive MRSA colonization surveillance program. It is not intended to diagnose MRSA infection nor to guide or monitor treatment for MRSA infections. Performed at [facility], [place] [address] [place], [place] [address]          Radiology Studies: No results found.      Scheduled Meds: . aspirin EC  81 mg Oral Daily  . atorvastatin  20 mg Oral Daily  . [START ON 02/11/2018] Chlorhexidine Gluconate Cloth  6 each Topical Q0600  . [START ON 02/11/2018]  darbepoetin (ARANESP) injection - DIALYSIS  60 mcg Intravenous Q Sat-HD  . [START ON 02/11/2018] doxercalciferol  1 mcg Intravenous Q T,Th,Sa-HD  . feeding supplement (NEPRO CARB STEADY)  237 mL Oral BID BM  . insulin aspart  0-5 Units Subcutaneous QHS  . insulin aspart  0-9 Units Subcutaneous TID WC  . multivitamin  1 tablet Oral Daily  . pantoprazole  20 mg Oral Daily  . PARoxetine  10 mg Oral Daily   Continuous Infusions: . [START ON 02/11/2018] ferric gluconate (FERRLECIT/NULECIT) IV    . piperacillin-tazobactam (ZOSYN)  IV 3.375 g (02/10/18 1634)     LOS: 2 days     Vernell Leep, MD, FACP, [facility]. [facility] Pager [phone]  If 7PM-7AM, please contact night-coverage [email or web address] Password [facility] 02/10/2018, 7:18 PM

## 2018-02-10 NOTE — Progress Notes (Addendum)
[facility] Progress Note   Dialysis Orders: nwtts 94.5kg 2k/3.5 bath rua avf heparin 6000 - venofer 50/ thurs - mircera 50ug q 2 weeks last 5/18  - hectorol 1  Assessment/Plan: 1. Sepsis secondary to HCAP -  empiric abtx - no + BC, WBC has actually increased since admission 2. ESRD -TTS - HD tomorrow - 3. Anemia - hgb 10  Down from last outpt hgb of 11.2  7% sat  Fe 10 ferritin 1090 - severe Fe def - replete-FOBT ne (tsat was 39% in April on weekly Fe)  hold if bacteremia - due for redose of ESA start weekly ESA 4. Secondary hyperparathyroidism - Ca/P ok- iPTH 300s - had been on an odd combination of low dose hectorol - sensipar (recently stopped) and added Ca bath - resume hectorol 5. HTN/volume - net UF 1.8 Thursday with post wt 96.4 - generally doesn't get to outpt EDW due to low BP - aver net UF ~3 L -  6. Nutrition - alb 2.7 - add nepro 7. Depression - per primary  - meds held temporarily due to acute toxic metabolic encephalopathy  Myriam Jacobson, PA-C [employer] [phone] 02/10/2018,12:12 PM  LOS: 2 days   Pt seen, examined and agree w A/P as above.  Kelly Splinter MD [facility] pager [phone]   02/10/2018, 2:41 PM    Subjective:   No c/o no problems with HD Thursday  Objective Vitals:   02/09/18 1828 02/09/18 2107 02/09/18 2200 02/10/18 0637  BP:  (!) 118/50  97/60  Pulse:  74  67  Resp:  18  18  Temp:  98.8 F (37.1 C)  99 F (37.2 C)  TempSrc:  Oral    SpO2: 97% 90% 98% 91%  Weight:      Height:       Physical Exam General: Heart: Lungs: Abdomen: Extremities: Dialysis Access:    Additional Objective Labs: Basic Metabolic Panel: Recent Labs  Lab 02/08/18 0022 02/08/18 0255 02/09/18 0642  NA 140 139 139  K 5.0 4.4 4.6  CL 103 102 104  CO2 24 23 23   GLUCOSE 185* 190* 97  BUN 35* 40* 62*  CREATININE 6.98* 7.25* 9.42*  CALCIUM 9.1 9.0 8.4*  PHOS  --   --  4.4   Liver  Function Tests: Recent Labs  Lab 02/07/18 2145 02/08/18 0022 02/09/18 0642  AST 18 25  --   ALT 13* 17  --   ALKPHOS 52 70  --   BILITOT 0.6 1.2  --   PROT 4.2* 6.0*  --   ALBUMIN 2.0* 2.7* 2.7*   Recent Labs  Lab 02/07/18 2145  LIPASE 25   CBC: Recent Labs  Lab 02/07/18 2145 02/08/18 0022 02/08/18 0255 02/09/18 0642  WBC 7.2 10.8* 13.0* 13.6*  NEUTROABS 6.1 8.7*  --   --   HGB 8.2* 9.9* 10.8* 10.0*  HCT 26.5* 32.1* 35.1* 31.4*  MCV 94.6 93.9 93.4 91.0  PLT 108* 136* 131* 134*   Blood Culture    Component Value Date/Time   SDES BLOOD LEFT HAND 02/07/2018 2150   SPECREQUEST  02/07/2018 2150    BOTTLES DRAWN AEROBIC AND ANAEROBIC Blood Culture adequate volume   CULT  02/07/2018 2150    NO GROWTH 3 DAYS Performed at [facility] [address] [place], [place] [address]    REPTSTATUS PENDING 02/07/2018 2150    Cardiac Enzymes: Recent Labs  Lab 02/08/18 0255  TROPONINI <0.03  CBG: Recent Labs  Lab 02/09/18 1421 02/09/18 1714 02/09/18 2104 02/10/18 0823 02/10/18 1145  GLUCAP 126* 122* 107* 183* 159*   Iron Studies:  Recent Labs    02/08/18 0022  IRON 10*  TIBC 144*  FERRITIN 1,090*   Lab Results  Component Value Date   INR 1.47 02/07/2018   INR 1.33 07/02/2014   Studies/Results: No results found. Medications: . piperacillin-tazobactam (ZOSYN)  IV 3.375 g (02/10/18 0707)   . aspirin EC  81 mg Oral Daily  . atorvastatin  20 mg Oral Daily  . Chlorhexidine Gluconate Cloth  6 each Topical Q0600  . insulin aspart  0-5 Units Subcutaneous QHS  . insulin aspart  0-9 Units Subcutaneous TID WC  . multivitamin  1 tablet Oral Daily  . pantoprazole  20 mg Oral Daily  . PARoxetine  10 mg Oral Daily      [facility] Progress Note   Dialysis Orders:  Assessment/Plan: 1.  2. ESRD - 3. Anemia -  4. Secondary hyperparathyroidism -  5. HTN/volume -  6. Nutrition -   Myriam Jacobson, PA-C [facility] [phone] 02/10/2018,12:12 PM  LOS: 2 days   Subjective:     Objective Vitals:   02/09/18 1828 02/09/18 2107 02/09/18 2200 02/10/18 0637  BP:  (!) 118/50  97/60  Pulse:  74  67  Resp:  18  18  Temp:  98.8 F (37.1 C)  99 F (37.2 C)  TempSrc:  Oral    SpO2: 97% 90% 98% 91%  Weight:      Height:       Physical Exam General:sleeping, rouses easily Heart: RRR Lungs: grossly clear Abdomen: soft NT Extremities: no LED SCDs off Dialysis Access:  Right upper AVF + bruit   Additional Objective Labs: Basic Metabolic Panel: Recent Labs  Lab 02/08/18 0022 02/08/18 0255 02/09/18 0642  NA 140 139 139  K 5.0 4.4 4.6  CL 103 102 104  CO2 24 23 23   GLUCOSE 185* 190* 97  BUN 35* 40* 62*  CREATININE 6.98* 7.25* 9.42*  CALCIUM 9.1 9.0 8.4*  PHOS  --   --  4.4   Liver Function Tests: Recent Labs  Lab 02/07/18 2145 02/08/18 0022 02/09/18 0642  AST 18 25  --   ALT 13* 17  --   ALKPHOS 52 70  --   BILITOT 0.6 1.2  --   PROT 4.2* 6.0*  --   ALBUMIN 2.0* 2.7* 2.7*   Recent Labs  Lab 02/07/18 2145  LIPASE 25   CBC: Recent Labs  Lab 02/07/18 2145 02/08/18 0022 02/08/18 0255 02/09/18 0642  WBC 7.2 10.8* 13.0* 13.6*  NEUTROABS 6.1 8.7*  --   --   HGB 8.2* 9.9* 10.8* 10.0*  HCT 26.5* 32.1* 35.1* 31.4*  MCV 94.6 93.9 93.4 91.0  PLT 108* 136* 131* 134*   Blood Culture    Component Value Date/Time   SDES BLOOD LEFT HAND 02/07/2018 2150   SPECREQUEST  02/07/2018 2150    BOTTLES DRAWN AEROBIC AND ANAEROBIC Blood Culture adequate volume   CULT  02/07/2018 2150    NO GROWTH 3 DAYS Performed at [facility] [address] [place], [place] [address]    REPTSTATUS PENDING 02/07/2018 2150    Cardiac Enzymes: Recent Labs  Lab 02/08/18 0255  TROPONINI <0.03   CBG: Recent Labs  Lab 02/09/18 1421 02/09/18 1714 02/09/18 2104 02/10/18 0823 02/10/18 1145  GLUCAP 126* 122* 107* 183* 159*  Iron Studies:  Recent Labs     02/08/18 0022  IRON 10*  TIBC 144*  FERRITIN 1,090*   Lab Results  Component Value Date   INR 1.47 02/07/2018   INR 1.33 07/02/2014   Studies/Results: No results found. Medications: . piperacillin-tazobactam (ZOSYN)  IV 3.375 g (02/10/18 0707)   . aspirin EC  81 mg Oral Daily  . atorvastatin  20 mg Oral Daily  . Chlorhexidine Gluconate Cloth  6 each Topical Q0600  . insulin aspart  0-5 Units Subcutaneous QHS  . insulin aspart  0-9 Units Subcutaneous TID WC  . multivitamin  1 tablet Oral Daily  . pantoprazole  20 mg Oral Daily  . PARoxetine  10 mg Oral Daily

## 2018-02-10 NOTE — Progress Notes (Signed)
Pt noted to desat into lower 80's whenever he removed oxygen this shift. Advised patient to keep it on, he remained compliant. Sats >95% on 2L.

## 2018-02-11 LAB — BLOOD CULTURE ID PANEL (REFLEXED)

## 2018-02-11 LAB — RENAL FUNCTION PANEL
ALBUMIN: 2.6 g/dL — AB (ref 3.5–5.0)
ANION GAP: 14 (ref 5–15)
BUN: 45 mg/dL — AB (ref 6–20)
CALCIUM: 8.6 mg/dL — AB (ref 8.9–10.3)
CO2: 24 mmol/L (ref 22–32)
Chloride: 102 mmol/L (ref 101–111)
Creatinine, Ser: 8.24 mg/dL — ABNORMAL HIGH (ref 0.61–1.24)
GFR calc Af Amer: 7 mL/min — ABNORMAL LOW (ref >60)
GFR, EST NON AFRICAN AMERICAN: 6 mL/min — AB (ref >60)
Glucose, Bld: 165 mg/dL — ABNORMAL HIGH (ref 65–99)
PHOSPHORUS: 5.8 mg/dL — AB (ref 2.5–4.6)
POTASSIUM: 4.1 mmol/L (ref 3.5–5.1)
SODIUM: 140 mmol/L (ref 135–145)

## 2018-02-11 LAB — GLUCOSE, CAPILLARY
GLUCOSE-CAPILLARY: 102 mg/dL — AB (ref 65–99)
GLUCOSE-CAPILLARY: 186 mg/dL — AB (ref 65–99)
GLUCOSE-CAPILLARY: 214 mg/dL — AB (ref 65–99)

## 2018-02-11 LAB — CBC
HEMATOCRIT: 34.1 % — AB (ref 39.0–52.0)
Hemoglobin: 10.9 g/dL — ABNORMAL LOW (ref 13.0–17.0)
MCH: 28.7 pg (ref 26.0–34.0)
MCHC: 32 g/dL (ref 30.0–36.0)
MCV: 89.7 fL (ref 78.0–100.0)
Platelets: 157 10*3/uL (ref 150–400)
RBC: 3.8 MIL/uL — ABNORMAL LOW (ref 4.22–5.81)
RDW: 13.2 % (ref 11.5–15.5)
WBC: 7 10*3/uL (ref 4.0–10.5)

## 2018-02-11 MED ORDER — DOXERCALCIFEROL 4 MCG/2ML IV SOLN
INTRAVENOUS | Status: AC
  Administered 2018-02-11: 1 ug via INTRAVENOUS
  Filled 2018-02-11: qty 2

## 2018-02-11 MED ORDER — LIDOCAINE-PRILOCAINE 2.5-2.5 % EX CREA
1.0000 "application " | TOPICAL_CREAM | CUTANEOUS | Status: DC | PRN
  Filled 2018-02-11: qty 5

## 2018-02-11 MED ORDER — SODIUM CHLORIDE 0.9 % IV SOLN: 100.0000 mL | INTRAVENOUS | Status: DC | PRN

## 2018-02-11 MED ORDER — PIPERACILLIN-TAZOBACTAM 3.375 G IVPB
3.3750 g | Freq: Two times a day (BID) | INTRAVENOUS | Status: DC
  Administered 2018-02-11 – 2018-02-13 (×4): 3.375 g via INTRAVENOUS
  Filled 2018-02-11 (×5): qty 50

## 2018-02-11 MED ORDER — LIDOCAINE HCL (PF) 1 % IJ SOLN
5.0000 mL | INTRAMUSCULAR | Status: DC | PRN
  Filled 2018-02-11: qty 5

## 2018-02-11 MED ORDER — HEPARIN SODIUM (PORCINE) 1000 UNIT/ML DIALYSIS: 1000.0000 [IU] | INTRAMUSCULAR | Status: DC | PRN

## 2018-02-11 MED ORDER — DARBEPOETIN ALFA 60 MCG/0.3ML IJ SOSY
PREFILLED_SYRINGE | INTRAMUSCULAR | Status: AC
  Administered 2018-02-11: 60 ug via INTRAVENOUS
  Filled 2018-02-11: qty 0.3

## 2018-02-11 MED ORDER — HEPARIN SODIUM (PORCINE) 1000 UNIT/ML DIALYSIS
20.0000 [IU]/kg | INTRAMUSCULAR | Status: DC | PRN
  Administered 2018-02-11: 1900 [IU] via INTRAVENOUS_CENTRAL
  Filled 2018-02-11: qty 1.9

## 2018-02-11 MED ORDER — PENTAFLUOROPROP-TETRAFLUOROETH EX AERO: 1.0000 "application " | INHALATION_SPRAY | CUTANEOUS | Status: DC | PRN

## 2018-02-11 NOTE — Progress Notes (Signed)
Patient tolerated HD trmt without difficulty.  Stand up scale weight obtained post trmt.  O2 sats decreased to 78% without oxygen.  O2 @ 3lpm via nasal cannula resumed, O2 sats returned to 98 %

## 2018-02-11 NOTE — Progress Notes (Signed)
SATURATION QUALIFICATIONS: (This note is used to comply with regulatory documentation for home oxygen)  Patient Saturations on Room Air at Rest = 86%  Patient Saturations on 2 L at Rest = 90%  Patient Saturations on 3 Liters of oxygen while Ambulating = 92%

## 2018-02-11 NOTE — Evaluation (Signed)
Physical Therapy Evaluation Patient Details Name: Blake [name] MRN: [id number] DOB: Apr 13, 1946 Today's Date: 02/11/2018   History of Present Illness  Pt is a 72 y/o male admitted secondary to AMS, sepsis and HCAP. PMH including but not limited to ESRD-HD (TTS), hypertension, hyperlipidemia, diabetes mellitus, GERD, depression and CHF.    Clinical Impression  Pt presented supine in bed with HOB elevated, awake and willing to participate in therapy session. Prior to admission, pt reported that he ambulated with use of SPC and was independent with ADLs. Pt currently very limited secondary to fatigue. Of note, pt's SPO2 decreased to as low as 75% on RA with activity, requiring sitting rest break and reapplication of 2L of O2 via [place] for SPO2 to increase to >90%. Pt would continue to benefit from skilled physical therapy services at this time while admitted and after d/c to address the below listed limitations in order to improve overall safety and independence with functional mobility.     Follow Up Recommendations SNF;Other (comment)(ST rehab; if pt refuses will need HHPT)    Equipment Recommendations  None recommended by PT    Recommendations for Other Services       Precautions / Restrictions Precautions Precautions: Fall Precaution Comments: watch SPO2 Restrictions Weight Bearing Restrictions: No      Mobility  Bed Mobility Overal bed mobility: Needs Assistance Bed Mobility: Supine to Sit;Sit to Supine     Supine to sit: Supervision Sit to supine: Supervision   General bed mobility comments: increased time and effort  Transfers Overall transfer level: Needs assistance Equipment used: Rolling walker (2 wheeled) Transfers: Sit to/from Stand Sit to Stand: Min assist         General transfer comment: assist to power into standing from EOB with RW  Ambulation/Gait Ambulation/Gait assistance: Min guard Ambulation Distance (Feet): 40 Feet Assistive device: Rolling  walker (2 wheeled) Gait Pattern/deviations: Step-through pattern;Decreased stride length Gait velocity: decreased Gait velocity interpretation: <1.31 ft/sec, indicative of household ambulator General Gait Details: pt with mild instability but no overt LOB or need for physical assistance; pt on RA with SPO2 decreasing as low as 75% with slow recovery to >90% with sitting rest break and reapplication of 2L of O2 via [place]  Stairs            Wheelchair Mobility    Modified Rankin (Stroke Patients Only)       Balance Overall balance assessment: Needs assistance Sitting-balance support: Feet supported Sitting balance-Leahy Scale: Good     Standing balance support: During functional activity;Bilateral upper extremity supported Standing balance-Leahy Scale: Poor                               Pertinent Vitals/Pain Pain Assessment: No/denies pain    Home Living Family/patient expects to be discharged to:: Private residence Living Arrangements: Other relatives Available Help at Discharge: Friend(s);Family;Available PRN/intermittently Type of Home: House Home Access: Stairs to enter Entrance Stairs-Rails: Psychiatric nurse of Steps: 3 Home Layout: One level Home Equipment: Cane - single point;Shower seat      Prior Function Level of Independence: Independent with assistive device(s)         Comments: pt ambulates with SPC when outside of the house     Hand Dominance        Extremity/Trunk Assessment   Upper Extremity Assessment Upper Extremity Assessment: Overall WFL for tasks assessed    Lower Extremity Assessment Lower Extremity Assessment: Generalized  weakness       Communication   Communication: No difficulties  Cognition Arousal/Alertness: Awake/alert Behavior During Therapy: WFL for tasks assessed/performed;Flat affect Overall Cognitive Status: No family/caregiver present to determine baseline cognitive functioning Area of  Impairment: Safety/judgement;Problem solving                         Safety/Judgement: Decreased awareness of deficits;Decreased awareness of safety   Problem Solving: Difficulty sequencing        General Comments      Exercises     Assessment/Plan    PT Assessment Patient needs continued PT services  PT Problem List Decreased activity tolerance;Decreased balance;Decreased mobility;Decreased coordination;Decreased knowledge of use of DME;Decreased safety awareness;Decreased knowledge of precautions;Cardiopulmonary status limiting activity       PT Treatment Interventions DME instruction;Gait training;Stair training;Functional mobility training;Therapeutic activities;Therapeutic exercise;Balance training;Neuromuscular re-education;Patient/family education    PT Goals (Current goals can be found in the Care Plan section)  Acute Rehab PT Goals Patient Stated Goal: return home PT Goal Formulation: With patient Time For Goal Achievement: 02/25/18 Potential to Achieve Goals: Good    Frequency Min 3X/week   Barriers to discharge        Co-evaluation               AM-PAC PT "6 Clicks" Daily Activity  Outcome Measure Difficulty turning over in bed (including adjusting bedclothes, sheets and blankets)?: None Difficulty moving from lying on back to sitting on the side of the bed? : None Difficulty sitting down on and standing up from a chair with arms (e.g., wheelchair, bedside commode, etc,.)?: Unable Help needed moving to and from a bed to chair (including a wheelchair)?: A Little Help needed walking in hospital room?: A Little Help needed climbing 3-5 steps with a railing? : A Little 6 Click Score: 18    End of Session Equipment Utilized During Treatment: Gait belt Activity Tolerance: Patient limited by fatigue Patient left: in bed;with call bell/phone within reach Nurse Communication: Mobility status PT Visit Diagnosis: Other abnormalities of gait and  mobility (R26.89)    Time: 8546-2703 PT Time Calculation (min) (ACUTE ONLY): 19 min   Charges:   PT Evaluation $PT Eval Moderate Complexity: 1 Mod     PT G Codes:        [place], PT, DPT [place] 02/11/2018, 5:24 PM

## 2018-02-11 NOTE — Progress Notes (Signed)
PROGRESS NOTE   Blake [name]  ZOX:[id number]    DOB: 20-Apr-1946    DOA: 02/07/2018  PCP: [name], MD   I have briefly reviewed patients previous medical records in [facility].  Brief Narrative:  72 year old male, lives with family, PMH of ESRD on TTS HD, HTN, HLD, DM 2, GERD, depression, chronic diastolic CHF, presented to ED with altered mental status, nausea, vomiting and generalized weakness.  As per EMS, hypoxic at 87% on room air, initial blood pressure 72/44 which improved to 98/50 after 2 L normal saline IV.  Admitted for suspected sepsis.  Improved.   Assessment & Plan:   Principal Problem:   Sepsis ([place]) Active Problems:   ESRD on dialysis ([place])   Depression   Encephalopathy, metabolic   Nausea and vomiting   Thrombocytopenia ([name])   Type II diabetes mellitus with renal manifestations ([place])   Hypocalcemia   Hypokalemia   Anemia   Hypomagnesemia   Suspected sepsis from HCAP: Met sepsis criteria on admission with hypotension, fever and tachypnea.  Lactate elevated at 2.13 and trended up to 4.  Hypotension responded to IV fluids.  CT abdomen and pelvis showed bibasilar infiltration concerning for pneumonia, aspiration pneumonia possible due to altered mental status.  Does make intermittent urine.  Empirically started on IV vancomycin and Zosyn.  Blood cultures x2: Negative to date.  Procalcitonin elevated: 51.6.  Urine microscopy not indicative of UTI.  MRSA PCR negative.  Discontinued vancomycin.  Improved.  Consider switching to oral Augmentin in a.m. 1 of 2 blood cultures returned positive for gram-positive rods, could be a contaminant but will follow final culture results.  Acute respiratory failure with hypoxia: Likely related to pneumonia.  Incentive spirometry.  Wean oxygen as tolerated.  Advised him that he may need to even go home on short.  Of home oxygen.  He verbalized understanding.  Discussed in detail with RN to try and wean off oxygen as  tolerated.  Acute toxic metabolic encephalopathy: CT head negative for acute findings.  No focal neurological deficits.  Likely related to acute infection.  Resolved and mental status likely at or close to baseline.  Thrombocytopenia: May be related to infection.  Improved compared to initial arrival.  Low index of suspicion for TTP.  Resolved on today's labs.  ESRD on TTS HD: Nephrology follow-up appreciated.  Patient underwent hemodialysis on 5/30.  Underwent HD on 6/1.  Depression: Home medications were initially held.  Pharmacy verified medications again and resumed prior home dose of Paxil on 5/31.  Stable.  Essential hypertension: Antihypertensives held due to initial hypotension.  Hypotension resolved and blood pressures normal at this time.  Type II DM with renal complications: Last W1X 6 on 07/02/2014.  Mildly uncontrolled and fluctuating.  Continue SSI.  No changes.  Hypokalemia: Replaced.  Hypomagnesemia: Replaced.  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  Stable.  Follow CBCs periodically.  Nausea and vomiting: Unclear etiology.  CT abdomen and pelvis without acute findings.  Resolved.  Tolerating diet.   DVT prophylaxis: SCDs Code Status: Full Family Communication: None at bedside Disposition: DC home pending clinical improvement, possibly tomorrow pending PT evaluation.  Possible discharge home 6/2.   Consultants:  Nephrology  Procedures:  HD  Antimicrobials:  Discontinued IV vancomycin. IV Zosyn.   Subjective: Patient seen this morning at HD.  Denied complaints.  No cough, dyspnea, chest pain or pain elsewhere.  ROS: As above.  Objective:  Vitals:   02/11/18 1220 02/11/18 1243 02/11/18 1540 02/11/18  1542  BP: (!) 120/57 (!) 123/55    Pulse: 68 69    Resp: 16 18    Temp: 97.9 F (36.6 C) 98 F (36.7 C)    TempSrc: Oral Oral    SpO2: 99% 96% 98% 95%  Weight: 94.8 kg (208 lb 15.9 oz) 95.2 kg (209 lb 14.4 oz)    Height:        Examination:  General  exam: Pleasant elderly male, moderately built and nourished lying comfortably propped up in bed.  Oral mucosa moist.  Throat exam without acute findings.  Patient seen this morning at HD. Respiratory system: Clear to auscultation.  No increased work of breathing. Cardiovascular system: S1 & S2 heard, RRR. No JVD, murmurs, rubs, gallops or clicks. No pedal edema.  Telemetry personally reviewed: Sinus rhythm. Gastrointestinal system: Abdomen is nondistended, soft and nontender. No organomegaly or masses felt. Normal bowel sounds heard.  Stable Central nervous system: Alert and oriented x3. No focal neurological deficits.  Stable. Extremities: Symmetric 5 x 5 power.  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  Right great toe amputated.  No acute findings noted to explain patient's pain symptoms. Skin: No rashes, lesions or ulcers Psychiatry: Judgement and insight appear normal. Mood & affect appropriate.     Data Reviewed: I have personally reviewed following labs and imaging studies  CBC: Recent Labs  Lab 02/07/18 2145 02/08/18 0022 02/08/18 0255 02/09/18 0642 02/11/18 0739  WBC 7.2 10.8* 13.0* 13.6* 7.0  NEUTROABS 6.1 8.7*  --   --   --   HGB 8.2* 9.9* 10.8* 10.0* 10.9*  HCT 26.5* 32.1* 35.1* 31.4* 34.1*  MCV 94.6 93.9 93.4 91.0 89.7  PLT 108* 136* 131* 134* 461   Basic Metabolic Panel: Recent Labs  Lab 02/07/18 2145 02/07/18 2345 02/08/18 0022 02/08/18 0255 02/09/18 0642 02/11/18 0739  NA 145  --  140 139 139 140  K 2.3*  --  5.0 4.4 4.6 4.1  CL 118*  --  103 102 104 102  CO2 18*  --  '24 23 23 24  ' GLUCOSE 150*  --  185* 190* 97 165*  BUN 25*  --  35* 40* 62* 45*  CREATININE 4.56*  --  6.98* 7.25* 9.42* 8.24*  CALCIUM 5.9*  --  9.1 9.0 8.4* 8.6*  MG  --  1.0*  --  1.6* 1.8  --   PHOS  --   --   --   --  4.4 5.8*   Liver Function Tests: Recent Labs  Lab 02/07/18 2145 02/08/18 0022 02/09/18 0642 02/11/18 0739  AST 18 25  --   --   ALT 13* 17  --   --     ALKPHOS 52 70  --   --   BILITOT 0.6 1.2  --   --   PROT 4.2* 6.0*  --   --   ALBUMIN 2.0* 2.7* 2.7* 2.6*   Coagulation Profile: Recent Labs  Lab 02/07/18 2145  INR 1.47   Cardiac Enzymes: Recent Labs  Lab 02/08/18 0255  TROPONINI <0.03   HbA1C: No results for input(s): HGBA1C in the last 72 hours. CBG: Recent Labs  Lab 02/10/18 1145 02/10/18 1651 02/10/18 2300 02/11/18 1235 02/11/18 1648  GLUCAP 159* 253* 133* 102* 186*    Recent Results (from the past 240 hour(s))  Blood culture (routine x 2)     Status: None (Preliminary result)   Collection Time: 02/07/18  9:35 PM  Result Value Ref Range Status  Specimen Description BLOOD LEFT ANTECUBITAL  Final   Special Requests   Final    BOTTLES DRAWN AEROBIC AND ANAEROBIC Blood Culture adequate volume   Culture  Setup Time   Final    ANAEROBIC BOTTLE ONLY GRAM POSITIVE RODS CRITICAL RESULT CALLED TO, READ BACK BY AND VERIFIED WITH: V BRYK PHARMD 02/11/18 0101 JDW    Culture   Final    GRAM POSITIVE RODS CULTURE REINCUBATED FOR BETTER GROWTH Performed at [facility], [place] [address], [place], [place] [address]    Report Status PENDING  Incomplete  Blood Culture ID Panel (Reflexed)     Status: None   Collection Time: 02/07/18  9:35 PM  Result Value Ref Range Status   Enterococcus species NOT DETECTED NOT DETECTED Final   Listeria monocytogenes NOT DETECTED NOT DETECTED Final   Staphylococcus species NOT DETECTED NOT DETECTED Final    Comment: CRITICAL RESULT CALLED TO, READ BACK BY AND VERIFIED WITH: V BRYK PHARMD 02/11/18 0101 JDW    Staphylococcus aureus NOT DETECTED NOT DETECTED Final   Streptococcus species NOT DETECTED NOT DETECTED Final   Streptococcus agalactiae NOT DETECTED NOT DETECTED Final   Streptococcus pneumoniae NOT DETECTED NOT DETECTED Final   Streptococcus pyogenes NOT DETECTED NOT DETECTED Final   Acinetobacter baumannii NOT DETECTED NOT DETECTED Final   Enterobacteriaceae species NOT  DETECTED NOT DETECTED Final   Enterobacter cloacae complex NOT DETECTED NOT DETECTED Final   Escherichia coli NOT DETECTED NOT DETECTED Final   Klebsiella oxytoca NOT DETECTED NOT DETECTED Final   Klebsiella pneumoniae NOT DETECTED NOT DETECTED Final   Proteus species NOT DETECTED NOT DETECTED Final   Serratia marcescens NOT DETECTED NOT DETECTED Final   Haemophilus influenzae NOT DETECTED NOT DETECTED Final   Neisseria meningitidis NOT DETECTED NOT DETECTED Final   Pseudomonas aeruginosa NOT DETECTED NOT DETECTED Final   Candida albicans NOT DETECTED NOT DETECTED Final   Candida glabrata NOT DETECTED NOT DETECTED Final   Candida krusei NOT DETECTED NOT DETECTED Final   Candida parapsilosis NOT DETECTED NOT DETECTED Final   Candida tropicalis NOT DETECTED NOT DETECTED Final  Blood culture (routine x 2)     Status: None (Preliminary result)   Collection Time: 02/07/18  9:50 PM  Result Value Ref Range Status   Specimen Description BLOOD LEFT HAND  Final   Special Requests   Final    BOTTLES DRAWN AEROBIC AND ANAEROBIC Blood Culture adequate volume   Culture   Final    NO GROWTH 4 DAYS Performed at [facility], [address], [place], [place] [address]    Report Status PENDING  Incomplete  MRSA PCR Screening     Status: None   Collection Time: 02/08/18 11:23 PM  Result Value Ref Range Status   MRSA by PCR NEGATIVE NEGATIVE Final    Comment:        The GeneXpert MRSA Assay (FDA approved for NASAL specimens only), is one component of a comprehensive MRSA colonization surveillance program. It is not intended to diagnose MRSA infection nor to guide or monitor treatment for MRSA infections. Performed at [facility], [place] [address], [place], [place] [address]          Radiology Studies: No results found.      Scheduled Meds: . aspirin EC  81 mg Oral Daily  . atorvastatin  20 mg Oral Daily  . Chlorhexidine Gluconate Cloth  6 each Topical Q0600  .  darbepoetin (ARANESP) injection -  DIALYSIS  60 mcg Intravenous Q Sat-HD  . doxercalciferol  1 mcg Intravenous Q T,Th,Sa-HD  . feeding supplement (NEPRO CARB STEADY)  237 mL Oral BID BM  . insulin aspart  0-5 Units Subcutaneous QHS  . insulin aspart  0-9 Units Subcutaneous TID WC  . multivitamin  1 tablet Oral Daily  . pantoprazole  20 mg Oral Daily  . PARoxetine  10 mg Oral Daily   Continuous Infusions: . ferric gluconate (FERRLECIT/NULECIT) IV Stopped (02/11/18 1155)  . piperacillin-tazobactam (ZOSYN)  IV 3.375 g (02/11/18 1500)     LOS: 3 days     Vernell Leep, MD, FACP, [facility]. [facility] Pager [phone]  If 7PM-7AM, please contact night-coverage [email or web address] Password [facility] 02/11/2018, 4:51 PM

## 2018-02-11 NOTE — Progress Notes (Signed)
PT Cancellation Note  Patient Details Name: Blake [name] MRN: [id number] DOB: 01/04/1946   Cancelled Treatment:    Reason Eval/Treat Not Completed: Patient at procedure or test/unavailable. Pt and pt's bed out of room. PT will continue to follow acutely.   [place] 02/11/2018, 8:35 AM

## 2018-02-11 NOTE — Progress Notes (Signed)
PHARMACY - PHYSICIAN COMMUNICATION CRITICAL VALUE ALERT - BLOOD CULTURE IDENTIFICATION (BCID)  Blake [name] is an 72 y.o. male who presented to [facility] on 02/07/2018.  Assessment:  Gram stain GPR in 1/4 bottles.  Name of physician (or Provider) Contacted: [facility]  Current antibiotics: Zosyn, considering change to Augmentin in am.  Changes to prescribed antibiotics recommended:  If any change would need to wait for full report.  Results for orders placed or performed during the hospital encounter of 02/07/18  Blood Culture ID Panel (Reflexed) (Collected: 02/07/2018  9:35 PM)  Result Value Ref Range   Enterococcus species NOT DETECTED NOT DETECTED   Listeria monocytogenes NOT DETECTED NOT DETECTED   Staphylococcus species NOT DETECTED NOT DETECTED   Staphylococcus aureus NOT DETECTED NOT DETECTED   Streptococcus species NOT DETECTED NOT DETECTED   Streptococcus agalactiae NOT DETECTED NOT DETECTED   Streptococcus pneumoniae NOT DETECTED NOT DETECTED   Streptococcus pyogenes NOT DETECTED NOT DETECTED   Acinetobacter baumannii NOT DETECTED NOT DETECTED   Enterobacteriaceae species NOT DETECTED NOT DETECTED   Enterobacter cloacae complex NOT DETECTED NOT DETECTED   Escherichia coli NOT DETECTED NOT DETECTED   Klebsiella oxytoca NOT DETECTED NOT DETECTED   Klebsiella pneumoniae NOT DETECTED NOT DETECTED   Proteus species NOT DETECTED NOT DETECTED   Serratia marcescens NOT DETECTED NOT DETECTED   Haemophilus influenzae NOT DETECTED NOT DETECTED   Neisseria meningitidis NOT DETECTED NOT DETECTED   Pseudomonas aeruginosa NOT DETECTED NOT DETECTED   Candida albicans NOT DETECTED NOT DETECTED   Candida glabrata NOT DETECTED NOT DETECTED   Candida krusei NOT DETECTED NOT DETECTED   Candida parapsilosis NOT DETECTED NOT DETECTED   Candida tropicalis NOT DETECTED NOT DETECTED    Wynona Neat, PharmD, BCPS  02/11/2018  1:16 AM

## 2018-02-11 NOTE — Progress Notes (Addendum)
[facility] Progress Note   Dialysis Orders: nwtts 94.5kg 2k/3.5 bath rua avf heparin 6000 - venofer 50/ thurs - mircera 50ug q 2 weeks last 5/18  - hectorol 1  Assessment/Plan: 1. Sepsis secondary to HCAP -  empiric abtx - no + BC, WBC has actually increased since admission- labs pending  2. ESRD -TTS on HD goal 4 L - pre wt 97.9 - bed scale- labs pending 3. Anemia - hgb 10 5/30  Down from last outpt hgb of 11.2  7% sat  Fe 10 ferritin 1090 - severe Fe def - replete-FOBT ne (tsat was 39% in April on weekly Fe)  hold if bacteremia - due for redose of ESA start weekly ESA- CBC pending 4. Secondary hyperparathyroidism - Ca/P ok- iPTH 300s - had been on an odd combination of low dose hectorol - sensipar (recently stopped) and added Ca bath - resume hectorol- chemistries pending 5. HTN/volume - net UF 1.8 Thursday with post wt 96.4 - generally doesn't get to outpt EDW due to low BP - aver net UF ~3 L - trying for more today - BP coming down with minimal removal, so reduced goal and stressed to patient that he needed to stand post HD (he refused pre HD but normal walks and tells me he drives a car) 6. Nutrition - alb 2.7 - added nepro 7. Depression - per primary  - paxil resumed 8. Mild thrombocytopenia -stable CBC pending  Myriam Jacobson, PA-C [place] 02/11/2018,7:54 AM  LOS: 3 days   Pt seen, examined and agree w A/P as above. Kelly Splinter MD [facility] pager [phone]   02/11/2018, 2:22 PM    Subjective:   No c/o   Objective Vitals:   02/10/18 1353 02/10/18 2207 02/11/18 0500 02/11/18 0642  BP: 108/68 (!) 119/50  (!) 152/68  Pulse: 63 (!) 58  61  Resp: 18 18  16   Temp: 98.6 F (37 C) 98 F (36.7 C)  98 F (36.7 C)  TempSrc: Oral Oral  Oral  SpO2:  99%  97%  Weight:   95.9 kg (211 lb 6.7 oz)   Height:       Physical Exam General: NAD supine on HD wearing O2 - somewhat depressed affect Heart:  RRR no rub Lungs: dim BS poor expansions Abdomen: soft NT throughtout + BS Extremities: no LE edema absent right great toe no wounds Dialysis Access:  Right upper AVF Qb 400    Additional Objective Labs: Basic Metabolic Panel: Recent Labs  Lab 02/08/18 0022 02/08/18 0255 02/09/18 0642  NA 140 139 139  K 5.0 4.4 4.6  CL 103 102 104  CO2 24 23 23   GLUCOSE 185* 190* 97  BUN 35* 40* 62*  CREATININE 6.98* 7.25* 9.42*  CALCIUM 9.1 9.0 8.4*  PHOS  --   --  4.4   Liver Function Tests: Recent Labs  Lab 02/07/18 2145 02/08/18 0022 02/09/18 0642  AST 18 25  --   ALT 13* 17  --   ALKPHOS 52 70  --   BILITOT 0.6 1.2  --   PROT 4.2* 6.0*  --   ALBUMIN 2.0* 2.7* 2.7*   Recent Labs  Lab 02/07/18 2145  LIPASE 25   CBC: Recent Labs  Lab 02/07/18 2145 02/08/18 0022 02/08/18 0255 02/09/18 0642  WBC 7.2 10.8* 13.0* 13.6*  NEUTROABS 6.1 8.7*  --   --   HGB 8.2* 9.9* 10.8* 10.0*  HCT 26.5* 32.1*  35.1* 31.4*  MCV 94.6 93.9 93.4 91.0  PLT 108* 136* 131* 134*   Blood Culture    Component Value Date/Time   SDES BLOOD LEFT HAND 02/07/2018 2150   SPECREQUEST  02/07/2018 2150    BOTTLES DRAWN AEROBIC AND ANAEROBIC Blood Culture adequate volume   CULT  02/07/2018 2150    NO GROWTH 3 DAYS Performed at [facility], [place] [address] [place], [place] [address]    REPTSTATUS PENDING 02/07/2018 2150    Cardiac Enzymes: Recent Labs  Lab 02/08/18 0255  TROPONINI <0.03   CBG: Recent Labs  Lab 02/09/18 2104 02/10/18 0823 02/10/18 1145 02/10/18 1651 02/10/18 2300  GLUCAP 107* 183* 159* 253* 133*   Iron Studies: No results for input(s): IRON, TIBC, TRANSFERRIN, FERRITIN in the last 72 hours. Lab Results  Component Value Date   INR 1.47 02/07/2018   INR 1.33 07/02/2014   Studies/Results: No results found. Medications: . sodium chloride    . sodium chloride    . ferric gluconate (FERRLECIT/NULECIT) IV    . piperacillin-tazobactam (ZOSYN)  IV 3.375 g  (02/11/18 0528)   . aspirin EC  81 mg Oral Daily  . atorvastatin  20 mg Oral Daily  . Chlorhexidine Gluconate Cloth  6 each Topical Q0600  . darbepoetin (ARANESP) injection - DIALYSIS  60 mcg Intravenous Q Sat-HD  . doxercalciferol  1 mcg Intravenous Q T,Th,Sa-HD  . feeding supplement (NEPRO CARB STEADY)  237 mL Oral BID BM  . insulin aspart  0-5 Units Subcutaneous QHS  . insulin aspart  0-9 Units Subcutaneous TID WC  . multivitamin  1 tablet Oral Daily  . pantoprazole  20 mg Oral Daily  . PARoxetine  10 mg Oral Daily

## 2018-02-12 LAB — CULTURE, BLOOD (ROUTINE X 2)
Culture: NO GROWTH
Special Requests: ADEQUATE

## 2018-02-12 LAB — GLUCOSE, CAPILLARY
GLUCOSE-CAPILLARY: 153 mg/dL — AB (ref 65–99)
Glucose-Capillary: 177 mg/dL — ABNORMAL HIGH (ref 65–99)
Glucose-Capillary: 188 mg/dL — ABNORMAL HIGH (ref 65–99)
Glucose-Capillary: 163 mg/dL — ABNORMAL HIGH (ref 65–99)

## 2018-02-12 MED ORDER — SODIUM CHLORIDE 0.9 % IV SOLN: 125.0000 mg | INTRAVENOUS | Status: DC

## 2018-02-12 NOTE — Progress Notes (Signed)
Pharmacy Antibiotic Note  Blake [name] is a 72 y.o. male admitted on 02/07/2018 with sepsis from HCAP. Pharmacy has been consulted for zosyn dosing.   Plan: Zosyn 3.375gm IV Q12H (4 hr inf) F/u renal fxn, C&S, clinical status and pre-HD vanc level  Height: 5\' 8"  (172.7 cm) Weight: 209 lb 14.4 oz (95.2 kg) IBW/kg (Calculated) : 68.4  Temp (24hrs), Avg:98.4 F (36.9 C), Min:98 F (36.7 C), Max:99.1 F (37.3 C)  Recent Labs  Lab 02/07/18 2145 02/07/18 2150 02/08/18 0022 02/08/18 0255 02/09/18 0642 02/11/18 0739  WBC 7.2  --  10.8* 13.0* 13.6* 7.0  CREATININE 4.56*  --  6.98* 7.25* 9.42* 8.24*  LATICACIDVEN  --  3.13* 3.9* 4.1*  --   --     Estimated Creatinine Clearance: 9.3 mL/min (A) (by C-G formula based on SCr of 8.24 mg/dL (H)).    No Known Allergies  Antimicrobials this admission: Vanc 5/28>>6/1 Zosyn 5/28>>  Thank you for allowing pharmacy to be a part of this patient's care.  Pharmacy will sign off. Please consult again if needed.  Blaine Hamper Dayana Dalporto 02/12/2018 12:32 PM

## 2018-02-12 NOTE — Progress Notes (Signed)
PROGRESS NOTE   Blake [name]  MRN:[id number]    DOB: 05/30/1946    DOA: 02/07/2018  PCP: Ramachandran, Ajith, MD   I have briefly reviewed patients previous medical records in [facility].  Brief Narrative:  71-year-old male, lives with family, PMH of ESRD on TTS HD, HTN, HLD, DM 2, GERD, depression, chronic diastolic CHF, presented to ED with altered mental status, nausea, vomiting and generalized weakness.  As per EMS, hypoxic at 87% on room air, initial blood pressure 72/44 which improved to 98/50 after 2 L normal saline IV.  Admitted for suspected sepsis.  Improved.   Assessment & Plan:   Principal Problem:   Sepsis ([name]) Active Problems:   ESRD on dialysis ([name])   Depression   Encephalopathy, metabolic   Nausea and vomiting   Thrombocytopenia ([name])   Type II diabetes mellitus with renal manifestations ([name])   Hypocalcemia   Hypokalemia   Anemia   Hypomagnesemia   Suspected sepsis from HCAP: Met sepsis criteria on admission with hypotension, fever and tachypnea.  Lactate elevated at 2.13 and trended up to 4.  Hypotension responded to IV fluids.  CT abdomen and pelvis showed bibasilar infiltration concerning for pneumonia, aspiration pneumonia possible due to altered mental status.  Does make intermittent urine.  Empirically started on IV vancomycin and Zosyn.  Blood cultures x2: Negative to date.  Procalcitonin elevated: 51.6.  Urine microscopy not indicative of UTI.  MRSA PCR negative.  Discontinued vancomycin.  Improved.  Consider switching to oral Augmentin at discharge. 1 of 2 blood cultures returned positive for gram-positive rods, could be a contaminant but will follow final culture results- still pending  Acute respiratory failure with hypoxia: Likely related to pneumonia.  Incentive spirometry.  Wean oxygen as tolerated.  Advised him that he may need to even go home on short.  Of home oxygen.  He verbalized understanding.  Remained hypoxic even at rest on room  air on 6/1.  Acute toxic metabolic encephalopathy: CT head negative for acute findings.  No focal neurological deficits.  Likely related to acute infection.  Seems to have resolved.  Thrombocytopenia: May be related to infection.  Improved compared to initial arrival.  Low index of suspicion for TTP.  Resolved on labs 6/1.  ESRD on TTS HD: Nephrology follow-up appreciated.  Patient underwent hemodialysis on 5/30.  Underwent HD on 6/1.  Nephrology following and next HD 6/4.  Depression: Home medications were initially held.  Pharmacy verified medications again and resumed prior home dose of Paxil on 5/31.  Stable.  Essential hypertension: Antihypertensives held due to initial hypotension.  Hypotension resolved and blood pressures normal at this time.  Type II DM with renal complications: Last A1c 6 on 07/02/2014.  Mildly uncontrolled and fluctuating.  Continue SSI.  No changes.  Hypokalemia: Replaced.  Hypomagnesemia: Replaced.  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  Stable in the 10 g range.  Nausea and vomiting: Unclear etiology.  CT abdomen and pelvis without acute findings.  Resolved.  Tolerating diet.  1 of 2 blood cultures positive for gram-positive rods: Possibly contaminant.  Follow final results.  No change in antibiotic regimen.  Discussed with pharmacy 6/1.   DVT prophylaxis: SCDs Code Status: Full Family Communication: None at bedside Disposition: DC pending final blood culture results.  PT recommends SNF and patient agreeable.  Social work consulted.   Consultants:  Nephrology  Procedures:  HD  Antimicrobials:  Discontinued IV vancomycin. IV Zosyn.   Subjective: Patient interviewed and examined with   RN this morning.  No complaints reported.  Denies dyspnea or cough.  Agreeable to SNF.  ROS: As above.  Objective:  Vitals:   02/11/18 1540 02/11/18 1542 02/11/18 2042 02/12/18 0440  BP:   (!) 104/45 (!) 128/54  Pulse:   79 62  Resp:   18   Temp:   98 F (36.7  C) 99.1 F (37.3 C)  TempSrc:   Oral Oral  SpO2: 98% 95% 96% 98%  Weight:      Height:        Examination: No significant change in exam compared to 6/1.  General exam: Pleasant elderly male, moderately built and nourished lying comfortably propped up in bed.  Oral mucosa moist.   Respiratory system: Clear to auscultation.  No increased work of breathing. Cardiovascular system: S1 & S2 heard, RRR. No JVD, murmurs, rubs, gallops or clicks. No pedal edema.  Telemetry personally reviewed: Sinus rhythm. Gastrointestinal system: Abdomen is nondistended, soft and nontender. No organomegaly or masses felt. Normal bowel sounds heard.  Stable Central nervous system: Alert and oriented x3. No focal neurological deficits.  Stable. Extremities: Symmetric 5 x 5 power.  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  Right great toe amputated.  No acute findings noted to explain patient's pain symptoms. Skin: No rashes, lesions or ulcers Psychiatry: Judgement and insight appear normal. Mood & affect appropriate.     Data Reviewed: I have personally reviewed following labs and imaging studies  CBC: Recent Labs  Lab 02/07/18 2145 02/08/18 0022 02/08/18 0255 02/09/18 0642 02/11/18 0739  WBC 7.2 10.8* 13.0* 13.6* 7.0  NEUTROABS 6.1 8.7*  --   --   --   HGB 8.2* 9.9* 10.8* 10.0* 10.9*  HCT 26.5* 32.1* 35.1* 31.4* 34.1*  MCV 94.6 93.9 93.4 91.0 89.7  PLT 108* 136* 131* 134* 829   Basic Metabolic Panel: Recent Labs  Lab 02/07/18 2145 02/07/18 2345 02/08/18 0022 02/08/18 0255 02/09/18 0642 02/11/18 0739  NA 145  --  140 139 139 140  K 2.3*  --  5.0 4.4 4.6 4.1  CL 118*  --  103 102 104 102  CO2 18*  --  _0 GLUCOSE 150*  --  185* 190* 97 165*  BUN 25*  --  35* 40* 62* 45*  CREATININE 4.56*  --  6.98* 7.25* 9.42* 8.24*  CALCIUM 5.9*  --  9.1 9.0 8.4* 8.6*  MG  --  1.0*  --  1.6* 1.8  --   PHOS  --   --   --   --  4.4 5.8*   Liver Function Tests: Recent Labs  Lab  02/07/18 2145 02/08/18 0022 02/09/18 0642 02/11/18 0739  AST 18 25  --   --   ALT 13* 17  --   --   ALKPHOS 52 70  --   --   BILITOT 0.6 1.2  --   --   PROT 4.2* 6.0*  --   --   ALBUMIN 2.0* 2.7* 2.7* 2.6*   Coagulation Profile: Recent Labs  Lab 02/07/18 2145  INR 1.47   Cardiac Enzymes: Recent Labs  Lab 02/08/18 0255  TROPONINI <0.03   HbA1C: No results for input(s): HGBA1C in the last 72 hours. CBG: Recent Labs  Lab 02/10/18 2300 02/11/18 1235 02/11/18 1648 02/11/18 2135 02/12/18 0750  GLUCAP 133* 102* 186* 214* 153*    Recent Results (from the past 240 hour(s))  Blood culture (routine x 2)  Status: None (Preliminary result)   Collection Time: 02/07/18  9:35 PM  Result Value Ref Range Status   Specimen Description BLOOD LEFT ANTECUBITAL  Final   Special Requests   Final    BOTTLES DRAWN AEROBIC AND ANAEROBIC Blood Culture adequate volume   Culture  Setup Time   Final    ANAEROBIC BOTTLE ONLY GRAM POSITIVE RODS CRITICAL RESULT CALLED TO, READ BACK BY AND VERIFIED WITH: V BRYK PHARMD 02/11/18 0101 JDW    Culture   Final    GRAM POSITIVE RODS IDENTIFICATION TO FOLLOW Performed at [facility], [place] [address] [place], [place] [address]    Report Status PENDING  Incomplete  Blood Culture ID Panel (Reflexed)     Status: None   Collection Time: 02/07/18  9:35 PM  Result Value Ref Range Status   Enterococcus species NOT DETECTED NOT DETECTED Final   Listeria monocytogenes NOT DETECTED NOT DETECTED Final   Staphylococcus species NOT DETECTED NOT DETECTED Final    Comment: CRITICAL RESULT CALLED TO, READ BACK BY AND VERIFIED WITH: V BRYK PHARMD 02/11/18 0101 JDW    Staphylococcus aureus NOT DETECTED NOT DETECTED Final   Streptococcus species NOT DETECTED NOT DETECTED Final   Streptococcus agalactiae NOT DETECTED NOT DETECTED Final   Streptococcus pneumoniae NOT DETECTED NOT DETECTED Final   Streptococcus pyogenes NOT DETECTED NOT DETECTED Final    Acinetobacter baumannii NOT DETECTED NOT DETECTED Final   Enterobacteriaceae species NOT DETECTED NOT DETECTED Final   Enterobacter cloacae complex NOT DETECTED NOT DETECTED Final   Escherichia coli NOT DETECTED NOT DETECTED Final   Klebsiella oxytoca NOT DETECTED NOT DETECTED Final   Klebsiella pneumoniae NOT DETECTED NOT DETECTED Final   Proteus species NOT DETECTED NOT DETECTED Final   Serratia marcescens NOT DETECTED NOT DETECTED Final   Haemophilus influenzae NOT DETECTED NOT DETECTED Final   Neisseria meningitidis NOT DETECTED NOT DETECTED Final   Pseudomonas aeruginosa NOT DETECTED NOT DETECTED Final   Candida albicans NOT DETECTED NOT DETECTED Final   Candida glabrata NOT DETECTED NOT DETECTED Final   Candida krusei NOT DETECTED NOT DETECTED Final   Candida parapsilosis NOT DETECTED NOT DETECTED Final   Candida tropicalis NOT DETECTED NOT DETECTED Final  Blood culture (routine x 2)     Status: None   Collection Time: 02/07/18  9:50 PM  Result Value Ref Range Status   Specimen Description BLOOD LEFT HAND  Final   Special Requests   Final    BOTTLES DRAWN AEROBIC AND ANAEROBIC Blood Culture adequate volume   Culture   Final    NO GROWTH 5 DAYS Performed at [facility], [address], [place], [place] [address]    Report Status 02/12/2018 FINAL  Final  MRSA PCR Screening     Status: None   Collection Time: 02/08/18 11:23 PM  Result Value Ref Range Status   MRSA by PCR NEGATIVE NEGATIVE Final    Comment:        The GeneXpert MRSA Assay (FDA approved for NASAL specimens only), is one component of a comprehensive MRSA colonization surveillance program. It is not intended to diagnose MRSA infection nor to guide or monitor treatment for MRSA infections. Performed at [facility], [place] [address], [place], [place] [address]          Radiology Studies: No results found.      Scheduled Meds: . aspirin EC  81 mg Oral Daily  . atorvastatin  20 mg Oral  Daily  . Chlorhexidine Gluconate Cloth  6 each Topical Q0600  . darbepoetin (ARANESP) injection - DIALYSIS  60 mcg Intravenous Q Sat-HD  . doxercalciferol  1 mcg Intravenous Q T,Th,Sa-HD  . feeding supplement (NEPRO CARB STEADY)  237 mL Oral BID BM  . insulin aspart  0-5 Units Subcutaneous QHS  . insulin aspart  0-9 Units Subcutaneous TID WC  . multivitamin  1 tablet Oral Daily  . pantoprazole  20 mg Oral Daily  . PARoxetine  10 mg Oral Daily   Continuous Infusions: . [START ON 02/14/2018] ferric gluconate (FERRLECIT/NULECIT) IV    . piperacillin-tazobactam (ZOSYN)  IV Stopped (02/12/18 0443)     LOS: 4 days     Vernell Leep, MD, FACP, [facility]. [facility] Pager [phone]  If 7PM-7AM, please contact night-coverage [email or web address] Password [email or web address] 02/12/2018, 12:10 PM

## 2018-02-12 NOTE — NC FL2 (Signed)
[place] MEDICAID FL2 LEVEL OF CARE SCREENING TOOL     IDENTIFICATION  Patient Name: Blake [name] Birthdate: Jan 29, 1946 Sex: male Admission Date (Current Location): 02/07/2018  [facility] and [place] Number:  Herbalist and Address:  [facility] [place], [place] [address] [place], [place] [address]      Provider Number: [id number]  Attending Physician Name and Address:  [name], MD  Relative Name and Phone Number:       Current Level of Care: Hospital Recommended Level of Care: [place] Prior Approval Number:    Date Approved/Denied:   PASRR Number: [id number]  Discharge Plan: SNF    Current Diagnoses: Patient Active Problem List   Diagnosis Date Noted  . Anemia 02/08/2018  . Hypomagnesemia 02/08/2018  . Thrombocytopenia ([place]) 02/07/2018  . Type II diabetes mellitus with renal manifestations ([place]) 02/07/2018  . Sepsis ([place]) 02/07/2018  . Hypocalcemia 02/07/2018  . Hypokalemia 02/07/2018  . Nausea and vomiting 04/21/2016  . Noncompliance with renal dialysis ([place]) 04/21/2016  . Hyperkalemia, diminished renal excretion 04/21/2016  . Nausea & vomiting 04/21/2016  . Encephalopathy, metabolic 34/19/3790  . Acute osteomyelitis of toe of right foot ([place]) 08/02/2015  . Ulcer of toe of right foot ([place]) 08/01/2015  . Hypothermia 08/01/2015  . Fall 08/01/2015  . Hypoglycemia associated with diabetes ([place]) 08/01/2015  . Occasional numbness/prickling/tingling of fingers  08/23/2014  . 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 Acute pulmonary edema ([name])   . ESRD on dialysis ([place]) 07/07/2014  . Depression 07/07/2014  . Neuropathic pain, leg, bilateral 07/07/2014  . Diabetic retinopathy ([place]) 07/07/2014  . Advance directive discussed with patient 07/07/2014  . Acute respiratory failure ([place]) 06/28/2014  . Scrotal pain 10/19/2012  . 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 Proteinuria 10/17/2012   . Hypoxia 10/17/2012  . Hypertension     Orientation RESPIRATION BLADDER Height & Weight     Self, Time, Situation, Place  O2([place] 1L) Continent Weight: 209 lb 14.4 oz (95.2 kg) Height:  5\' 8"  (172.7 cm)  BEHAVIORAL SYMPTOMS/MOOD NEUROLOGICAL BOWEL NUTRITION STATUS      Continent Diet(carb modified, renal with fluid restriction to 1200 mL)  AMBULATORY STATUS COMMUNICATION OF NEEDS Skin   Limited Assist Verbally Normal                       Personal Care Assistance Level of Assistance  Bathing, Feeding, Dressing Bathing Assistance: Limited assistance Feeding assistance: Limited assistance Dressing Assistance: Limited assistance     Functional Limitations Info  Sight, Hearing, Speech Sight Info: Adequate Hearing Info: Adequate Speech Info: Adequate    SPECIAL CARE FACTORS FREQUENCY  PT (By licensed PT), OT (By licensed OT)     PT Frequency: 5x/wk OT Frequency: 5x/wk            Contractures Contractures Info: Not present    Additional Factors Info  Code Status, Allergies, Psychotropic, Insulin Sliding Scale Code Status Info: Full Allergies Info: NKA Psychotropic Info: Paxil 10mg  daily Insulin Sliding Scale Info: 0-9 units 3x/day with meals; 0-5 units daily at bedtime       Current Medications (02/12/2018):  This is the current hospital active medication list Current Facility-Administered Medications  Medication Dose Route Frequency Provider Last Rate Last Dose  . acetaminophen (TYLENOL) suppository 650 mg  650 mg Rectal Q6H PRN Ivor Costa, MD      . albuterol (PROVENTIL) (2.5 MG/3ML)  0.083% nebulizer solution 2.5 mg  2.5 mg Nebulization Q4H PRN Ivor Costa, MD      . aspirin EC tablet 81 mg  81 mg Oral Daily [name], MD   81 mg at 02/12/18 0934  . atorvastatin (LIPITOR) tablet 20 mg  20 mg Oral Daily [name], MD   20 mg at 02/12/18 0935  . Chlorhexidine Gluconate Cloth 2 % PADS 6 each  6 each Topical Q0600 Alric Seton, PA-C   6 each at  02/12/18 0557  . Darbepoetin Alfa (ARANESP) injection 60 mcg  60 mcg Intravenous Q Sat-HD Alric Seton, PA-C   60 mcg at 02/11/18 1119  . doxercalciferol (HECTOROL) injection 1 mcg  1 mcg Intravenous Q T,Th,Sa-HD Alric Seton, PA-C   1 mcg at 02/11/18 1120  . feeding supplement (NEPRO CARB STEADY) liquid 237 mL  237 mL Oral BID BM Alric Seton, PA-C   237 mL at 02/10/18 1400  . [START ON 02/14/2018] ferric gluconate (NULECIT) 125 mg in sodium chloride 0.9 % 100 mL IVPB  125 mg Intravenous Q T,Th,Sa-HD Alric Seton, PA-C      . insulin aspart (novoLOG) injection 0-5 Units  0-5 Units Subcutaneous QHS Ivor Costa, MD   2 Units at 02/11/18 2140  . insulin aspart (novoLOG) injection 0-9 Units  0-9 Units Subcutaneous TID WC Ivor Costa, MD   2 Units at 02/12/18 1252  . multivitamin (RENA-VIT) tablet 1 tablet  1 tablet Oral Daily [name], MD   1 tablet at 02/12/18 0935  . ondansetron (ZOFRAN) injection 4 mg  4 mg Intravenous Q8H PRN Ivor Costa, MD      . pantoprazole (PROTONIX) EC tablet 20 mg  20 mg Oral Daily [name], MD   20 mg at 02/12/18 0935  . PARoxetine (PAXIL) tablet 10 mg  10 mg Oral Daily [name], MD   10 mg at 02/12/18 0935  . piperacillin-tazobactam (ZOSYN) IVPB 3.375 g  3.375 g Intravenous Q12H Vernell Leep D, MD 12.5 mL/hr at 02/12/18 1246 3.375 g at 02/12/18 1246     Discharge Medications: Please see discharge summary for a list of discharge medications.  Relevant Imaging Results:  Relevant Lab Results:   Additional Information SS#: [id number]; Will need HD, awaiting clip  Geralynn Ochs, LCSW

## 2018-02-12 NOTE — Progress Notes (Addendum)
[facility] Progress Note   Dialysis Orders: nwtts 94.5kg 2k/3.5 bath rua avf heparin 6000 - venofer 50/ thurs - mircera 50ug q 2 weekslast 5/18  - hectorol 1  Assessment/Plan: 1.Sepsis secondary to HCAP -empiric abtx - no + BC, WBC improved 2. ESRD-TTS  next HD Tuesday- need to get standing wts next HD 3. Anemia- hgb 10.9 stable 7% sat Fe 10 ferritin 1090 - severe Fe def not sure why so low- FOBT neg (tsat was 39% in April on weekly Fe) Aranesp 60 given - replete Fe- 125 x 4   4. Secondary hyperparathyroidism- Ca/P ok- iPTH 300s - had been on an odd combination of low dose hectorol - sensipar(recently stopped) and added Ca bath - resumed hectorol Ca 8.6 -re-eval Ca bath at discharge - resume calcium acetate 1 ac tid 5.HTN/volume- net UF 1.8 Thursday with post wt 96.4 - generally doesn't get to outpt EDW due to low BP - averages net UF ~3 L -net UF 2.2 L Sat with post standing weight of 94.8 - near EDW 6. Nutrition- alb 2.7 - added nepro 7. Depression -per primary - paxil resumed 8. Mild thrombocytopenia -improved 9. Debilitation - seems more debilitated than prior to admission when he said he could drive to dialysis and walk without O2 and go shopping   Myriam Jacobson, PA-C [place] [phone] 02/12/2018,10:36 AM  LOS: 4 days    Pt seen, examined and agree w A/P as above.  Kelly Splinter MD [facility] pager [phone]   02/12/2018, 1:23 PM     Subjective:   Given incentive spirometry -low O2 levels new for him as far as he knows  Objective Vitals:   02/11/18 1540 02/11/18 1542 02/11/18 2042 02/12/18 0440  BP:   (!) 104/45 (!) 128/54  Pulse:   79 62  Resp:   18   Temp:   98 F (36.7 C) 99.1 F (37.3 C)  TempSrc:   Oral Oral  SpO2: 98% 95% 96% 98%  Weight:      Height:       Physical Exam General: NAD  Heart: RRR Lungs: few crackles at bases-  Abdomen: soft NTND + BS Extremities: no LE  edema Dialysis Access: right upper AVF + bruit   Additional Objective Labs: Basic Metabolic Panel: Recent Labs  Lab 02/08/18 0255 02/09/18 0642 02/11/18 0739  NA 139 139 140  K 4.4 4.6 4.1  CL 102 104 102  CO2 23 23 24   GLUCOSE 190* 97 165*  BUN 40* 62* 45*  CREATININE 7.25* 9.42* 8.24*  CALCIUM 9.0 8.4* 8.6*  PHOS  --  4.4 5.8*   Liver Function Tests: Recent Labs  Lab 02/07/18 2145 02/08/18 0022 02/09/18 0642 02/11/18 0739  AST 18 25  --   --   ALT 13* 17  --   --   ALKPHOS 52 70  --   --   BILITOT 0.6 1.2  --   --   PROT 4.2* 6.0*  --   --   ALBUMIN 2.0* 2.7* 2.7* 2.6*   Recent Labs  Lab 02/07/18 2145  LIPASE 25   CBC: Recent Labs  Lab 02/07/18 2145 02/08/18 0022 02/08/18 0255 02/09/18 0642 02/11/18 0739  WBC 7.2 10.8* 13.0* 13.6* 7.0  NEUTROABS 6.1 8.7*  --   --   --   HGB 8.2* 9.9* 10.8* 10.0* 10.9*  HCT 26.5* 32.1* 35.1* 31.4* 34.1*  MCV 94.6 93.9 93.4 91.0 89.7  PLT 108*  136* 131* 134* 157   Blood Culture    Component Value Date/Time   SDES BLOOD LEFT HAND 02/07/2018 2150   SPECREQUEST  02/07/2018 2150    BOTTLES DRAWN AEROBIC AND ANAEROBIC Blood Culture adequate volume   CULT  02/07/2018 2150    NO GROWTH 4 DAYS Performed at [facility], [place] [address], [place], [place] [address]    REPTSTATUS PENDING 02/07/2018 2150    Cardiac Enzymes: Recent Labs  Lab 02/08/18 0255  TROPONINI <0.03   CBG: Recent Labs  Lab 02/10/18 2300 02/11/18 1235 02/11/18 1648 02/11/18 2135 02/12/18 0750  GLUCAP 133* 102* 186* 214* 153*   Iron Studies: No results for input(s): IRON, TIBC, TRANSFERRIN, FERRITIN in the last 72 hours. Lab Results  Component Value Date   INR 1.47 02/07/2018   INR 1.33 07/02/2014   Studies/Results: No results found. Medications: . ferric gluconate (FERRLECIT/NULECIT) IV Stopped (02/11/18 1155)  . piperacillin-tazobactam (ZOSYN)  IV Stopped (02/12/18 0443)   . aspirin EC  81 mg Oral Daily  . atorvastatin   20 mg Oral Daily  . Chlorhexidine Gluconate Cloth  6 each Topical Q0600  . darbepoetin (ARANESP) injection - DIALYSIS  60 mcg Intravenous Q Sat-HD  . doxercalciferol  1 mcg Intravenous Q T,Th,Sa-HD  . feeding supplement (NEPRO CARB STEADY)  237 mL Oral BID BM  . insulin aspart  0-5 Units Subcutaneous QHS  . insulin aspart  0-9 Units Subcutaneous TID WC  . multivitamin  1 tablet Oral Daily  . pantoprazole  20 mg Oral Daily  . PARoxetine  10 mg Oral Daily

## 2018-02-12 NOTE — Clinical Social Work Note (Signed)
Clinical Social Work Assessment  Patient Details  Name: Blake [name] MRN: [id number] Date of Birth: 1946/05/02  Date of referral:  02/12/18               Reason for consult:  Facility Placement, Discharge Planning                Permission sought to share information with:  Family Supports Permission granted to share information::  Yes, Verbal Permission Granted  Name::        Agency::     Relationship::     Contact Information:     Housing/Transportation Living arrangements for the past 2 months:  Single Family Home Source of Information:  Patient Patient Interpreter Needed:  None Criminal Activity/Legal Involvement Pertinent to Current Situation/Hospitalization:  No - Comment as needed Significant Relationships:  Other Family Members Lives with:  Other (Comment)(pt lives with niece) Do you feel safe going back to the place where you live?  Yes Need for family participation in patient care:  Yes (Comment)  Care giving concerns:  No family at bedside. Patient stated he lives with niece and niece works during the day and is in the home at night. Patient stated he does HD at [facility] located at [place]. Patient stated facility is near his home so he is able to drive himself to dialysis every TThS.    Social Worker assessment / plan:  CSW met patient at bedside to discuss discharge plan. Patient stated he is agreeable to discharge to a rehab facility and stated he has been to one in the past. Patient stated he would like to go to rehab that's close to his dialysis clinic. CSW to follow up with patient once bed offers are available.  Employment status:  Retired Nurse, adult PT Recommendations:  [place] / Referral to community resources:  [place]  Patient/Family's Response to care:  Patient willing to go to rehab for a couple of day to get back to baseline Patient/Family's  Understanding of and Emotional Response to Diagnosis, Current Treatment, and Prognosis:  CSW to follow up with patient once bed offers are available   Emotional Assessment Appearance:  Appears stated age Attitude/Demeanor/Rapport:  Charismatic Affect (typically observed):  Accepting, Pleasant Orientation:  Oriented to Self, Oriented to Situation, Oriented to Place, Oriented to  Time Alcohol / Substance use:  Not Applicable Psych involvement (Current and /or in the community):  No (Comment)  Discharge Needs  Concerns to be addressed:  No discharge needs identified Readmission within the last 30 days:  No Current discharge risk:  None Barriers to Discharge:  No SNF bed, Continued Medical Work up   [employer], LCSW 02/12/2018, 5:05 PM

## 2018-02-12 NOTE — [place] FL2 (Signed)
[place] MEDICAID FL2 LEVEL OF CARE SCREENING TOOL     IDENTIFICATION  Patient Name: Blake [name] Birthdate: [birth year] Sex: male Admission Date (Current Location): 02/07/2018  [facility] and [place] Number:  Herbalist and Address:  [facility], [place] [address], [place], [place] [address]      Provider Number: [id number]  Attending Physician Name and Address:  [name], MD  Relative Name and Phone Number:       Current Level of Care: Hospital Recommended Level of Care: [place] Prior Approval Number:    Date Approved/Denied:   PASRR Number: [id number]  Discharge Plan: SNF    Current Diagnoses: Patient Active Problem List   Diagnosis Date Noted  . Anemia 02/08/2018  . Hypomagnesemia 02/08/2018  . Thrombocytopenia ([place]) 02/07/2018  . Type II diabetes mellitus with renal manifestations ([place]) 02/07/2018  . Sepsis ([place]) 02/07/2018  . Hypocalcemia 02/07/2018  . Hypokalemia 02/07/2018  . Nausea and vomiting 04/21/2016  . Noncompliance with renal dialysis ([place]) 04/21/2016  . Hyperkalemia, diminished renal excretion 04/21/2016  . Nausea & vomiting 04/21/2016  . Encephalopathy, metabolic 02/54/2706  . Acute osteomyelitis of toe of right foot ([place]) 08/02/2015  . Ulcer of toe of right foot ([place]) 08/01/2015  . Hypothermia 08/01/2015  . Fall 08/01/2015  . Hypoglycemia associated with diabetes ([place]) 08/01/2015  . Occasional numbness/prickling/tingling of fingers  08/23/2014  . 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 Acute pulmonary edema ([name])   . ESRD on dialysis ([place]) 07/07/2014  . Depression 07/07/2014  . Neuropathic pain, leg, bilateral 07/07/2014  . Diabetic retinopathy ([place]) 07/07/2014  . Advance directive discussed with patient 07/07/2014  . Acute respiratory failure ([place]) 06/28/2014  . Scrotal pain 10/19/2012  . 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 Proteinuria 10/17/2012   . Hypoxia 10/17/2012  . Hypertension     Orientation RESPIRATION BLADDER Height & Weight     Self, Time, Situation, Place  O2([place] 1L) Continent Weight: 209 lb 14.4 oz (95.2 kg) Height:  5\' 8"  (172.7 cm)  BEHAVIORAL SYMPTOMS/MOOD NEUROLOGICAL BOWEL NUTRITION STATUS      Continent Diet(carb modified, renal with fluid restriction to 1200 mL)  AMBULATORY STATUS COMMUNICATION OF NEEDS Skin   Limited Assist Verbally Normal                       Personal Care Assistance Level of Assistance  Bathing, Feeding, Dressing Bathing Assistance: Limited assistance Feeding assistance: Limited assistance Dressing Assistance: Limited assistance     Functional Limitations Info  Sight, Hearing, Speech Sight Info: Adequate Hearing Info: Adequate Speech Info: Adequate    SPECIAL CARE FACTORS FREQUENCY  PT (By licensed PT), OT (By licensed OT)     PT Frequency: 5x/wk OT Frequency: 5x/wk            Contractures Contractures Info: Not present    Additional Factors Info  Code Status, Allergies, Psychotropic, Insulin Sliding Scale Code Status Info: Full Allergies Info: NKA Psychotropic Info: Paxil 10mg  daily Insulin Sliding Scale Info: 0-9 units 3x/day with meals; 0-5 units daily at bedtime       Current Medications (02/12/2018):  This is the current hospital active medication list Current Facility-Administered Medications  Medication Dose Route Frequency Provider Last Rate Last Dose  . acetaminophen (TYLENOL) suppository 650 mg  650 mg Rectal Q6H PRN Ivor Costa, MD      . albuterol (PROVENTIL) (2.5 MG/3ML)  0.083% nebulizer solution 2.5 mg  2.5 mg Nebulization Q4H PRN Ivor Costa, MD      . aspirin EC tablet 81 mg  81 mg Oral Daily [name], MD   81 mg at 02/12/18 0934  . atorvastatin (LIPITOR) tablet 20 mg  20 mg Oral Daily [name], MD   20 mg at 02/12/18 0935  . Chlorhexidine Gluconate Cloth 2 % PADS 6 each  6 each Topical Q0600 Alric Seton, PA-C   6 each at  02/12/18 0557  . Darbepoetin Alfa (ARANESP) injection 60 mcg  60 mcg Intravenous Q Sat-HD Alric Seton, PA-C   60 mcg at 02/11/18 1119  . doxercalciferol (HECTOROL) injection 1 mcg  1 mcg Intravenous Q T,Th,Sa-HD Alric Seton, PA-C   1 mcg at 02/11/18 1120  . feeding supplement (NEPRO CARB STEADY) liquid 237 mL  237 mL Oral BID BM Alric Seton, PA-C   237 mL at 02/10/18 1400  . [START ON 02/14/2018] ferric gluconate (NULECIT) 125 mg in sodium chloride 0.9 % 100 mL IVPB  125 mg Intravenous Q T,Th,Sa-HD Alric Seton, PA-C      . insulin aspart (novoLOG) injection 0-5 Units  0-5 Units Subcutaneous QHS Ivor Costa, MD   2 Units at 02/11/18 2140  . insulin aspart (novoLOG) injection 0-9 Units  0-9 Units Subcutaneous TID WC Ivor Costa, MD   2 Units at 02/12/18 1252  . multivitamin (RENA-VIT) tablet 1 tablet  1 tablet Oral Daily [name], MD   1 tablet at 02/12/18 0935  . ondansetron (ZOFRAN) injection 4 mg  4 mg Intravenous Q8H PRN Ivor Costa, MD      . pantoprazole (PROTONIX) EC tablet 20 mg  20 mg Oral Daily [name], MD   20 mg at 02/12/18 0935  . PARoxetine (PAXIL) tablet 10 mg  10 mg Oral Daily [name], MD   10 mg at 02/12/18 0935  . piperacillin-tazobactam (ZOSYN) IVPB 3.375 g  3.375 g Intravenous Q12H [name], MD   Stopped at 02/12/18 1631     Discharge Medications: Please see discharge summary for a list of discharge medications.  Relevant Imaging Results:  Relevant Lab Results:   Additional Information SS#: [id number]; HD at [facility] [place], [place]

## 2018-02-13 DIAGNOSIS — F3289 Other specified depressive episodes: Secondary | ICD-10-CM | POA: Diagnosis not present

## 2018-02-13 DIAGNOSIS — J9611 Chronic respiratory failure with hypoxia: Secondary | ICD-10-CM | POA: Diagnosis not present

## 2018-02-13 DIAGNOSIS — G9341 Metabolic encephalopathy: Secondary | ICD-10-CM | POA: Diagnosis not present

## 2018-02-13 DIAGNOSIS — D696 Thrombocytopenia, unspecified: Secondary | ICD-10-CM | POA: Diagnosis not present

## 2018-02-13 DIAGNOSIS — E1129 Type 2 diabetes mellitus with other diabetic kidney complication: Secondary | ICD-10-CM | POA: Diagnosis not present

## 2018-02-13 DIAGNOSIS — E876 Hypokalemia: Secondary | ICD-10-CM | POA: Diagnosis not present

## 2018-02-13 DIAGNOSIS — I959 Hypotension, unspecified: Secondary | ICD-10-CM | POA: Diagnosis not present

## 2018-02-13 DIAGNOSIS — Z89421 Acquired absence of other right toe(s): Secondary | ICD-10-CM | POA: Diagnosis not present

## 2018-02-13 DIAGNOSIS — M6281 Muscle weakness (generalized): Secondary | ICD-10-CM | POA: Diagnosis not present

## 2018-02-13 DIAGNOSIS — A419 Sepsis, unspecified organism: Secondary | ICD-10-CM | POA: Diagnosis not present

## 2018-02-13 DIAGNOSIS — R112 Nausea with vomiting, unspecified: Secondary | ICD-10-CM

## 2018-02-13 DIAGNOSIS — R1319 Other dysphagia: Secondary | ICD-10-CM | POA: Diagnosis not present

## 2018-02-13 DIAGNOSIS — E1169 Type 2 diabetes mellitus with other specified complication: Secondary | ICD-10-CM | POA: Diagnosis not present

## 2018-02-13 DIAGNOSIS — R6889 Other general symptoms and signs: Secondary | ICD-10-CM | POA: Diagnosis not present

## 2018-02-13 DIAGNOSIS — I120 Hypertensive chronic kidney disease with stage 5 chronic kidney disease or end stage renal disease: Secondary | ICD-10-CM | POA: Diagnosis not present

## 2018-02-13 DIAGNOSIS — J9601 Acute respiratory failure with hypoxia: Secondary | ICD-10-CM

## 2018-02-13 DIAGNOSIS — N186 End stage renal disease: Secondary | ICD-10-CM | POA: Diagnosis not present

## 2018-02-13 DIAGNOSIS — Z992 Dependence on renal dialysis: Secondary | ICD-10-CM | POA: Diagnosis not present

## 2018-02-13 DIAGNOSIS — R270 Ataxia, unspecified: Secondary | ICD-10-CM | POA: Diagnosis not present

## 2018-02-13 DIAGNOSIS — E785 Hyperlipidemia, unspecified: Secondary | ICD-10-CM | POA: Diagnosis not present

## 2018-02-13 DIAGNOSIS — N2581 Secondary hyperparathyroidism of renal origin: Secondary | ICD-10-CM | POA: Diagnosis not present

## 2018-02-13 DIAGNOSIS — K219 Gastro-esophageal reflux disease without esophagitis: Secondary | ICD-10-CM | POA: Diagnosis not present

## 2018-02-13 DIAGNOSIS — R5381 Other malaise: Secondary | ICD-10-CM | POA: Diagnosis not present

## 2018-02-13 DIAGNOSIS — D631 Anemia in chronic kidney disease: Secondary | ICD-10-CM | POA: Diagnosis not present

## 2018-02-13 DIAGNOSIS — D649 Anemia, unspecified: Secondary | ICD-10-CM | POA: Diagnosis not present

## 2018-02-13 DIAGNOSIS — D689 Coagulation defect, unspecified: Secondary | ICD-10-CM | POA: Diagnosis not present

## 2018-02-13 DIAGNOSIS — E1122 Type 2 diabetes mellitus with diabetic chronic kidney disease: Secondary | ICD-10-CM | POA: Diagnosis not present

## 2018-02-13 DIAGNOSIS — I5030 Unspecified diastolic (congestive) heart failure: Secondary | ICD-10-CM | POA: Diagnosis not present

## 2018-02-13 DIAGNOSIS — J189 Pneumonia, unspecified organism: Secondary | ICD-10-CM | POA: Diagnosis not present

## 2018-02-13 LAB — GLUCOSE, CAPILLARY
GLUCOSE-CAPILLARY: 147 mg/dL — AB (ref 65–99)
GLUCOSE-CAPILLARY: 168 mg/dL — AB (ref 65–99)
Glucose-Capillary: 176 mg/dL — ABNORMAL HIGH (ref 65–99)

## 2018-02-13 LAB — CULTURE, BLOOD (ROUTINE X 2): SPECIAL REQUESTS: ADEQUATE

## 2018-02-13 MED ORDER — AMOXICILLIN-POT CLAVULANATE 500-125 MG PO TABS
1.0000 | ORAL_TABLET | Freq: Every day | ORAL | Status: DC
  Administered 2018-02-13: 500 mg via ORAL
  Filled 2018-02-13: qty 1

## 2018-02-13 MED ORDER — CHLORHEXIDINE GLUCONATE CLOTH 2 % EX PADS
6.0000 | MEDICATED_PAD | Freq: Every day | CUTANEOUS | Status: DC
  Administered 2018-02-13: 6 via TOPICAL

## 2018-02-13 MED ORDER — INSULIN REGULAR HUMAN 100 UNIT/ML IJ SOLN: 0.0000 [IU] | Freq: Three times a day (TID) | INTRAMUSCULAR | Status: DC

## 2018-02-13 MED ORDER — AMOXICILLIN-POT CLAVULANATE 500-125 MG PO TABS: 1.0000 | ORAL_TABLET | Freq: Every day | ORAL | Status: DC

## 2018-02-13 NOTE — Progress Notes (Signed)
Patient will DC to: [place] Anticipated DC date: 02/13/18 Family notified: Patient declined Transport by: Domenica Reamer   Per MD patient ready for DC to [facility]. RN, patient, patient's family, and facility notified of DC. Discharge Summary sent to facility. RN given number for report [phone] Room 118). DC packet on chart. Ambulance transport requested for patient.   CSW signing off.  Cedric Fishman, LCSW Clinical Social Worker [phone]

## 2018-02-13 NOTE — Discharge Instructions (Signed)
Please get your medications reviewed and adjusted by your Primary MD. ° °Please request your Primary MD to go over all Hospital Tests and Procedure/Radiological results at the follow up, please get all Hospital records sent to your Prim MD by signing hospital release before you go home. ° °If you had Pneumonia of Lung problems at the Hospital: °Please get a 2 view Chest X ray done in 6-8 weeks after hospital discharge or sooner if instructed by your Primary MD. ° °If you have Congestive Heart Failure: °Please call your Cardiologist or Primary MD anytime you have any of the following symptoms:  °1) 3 pound weight gain in 24 hours or 5 pounds in 1 week  °2) shortness of breath, with or without a dry hacking cough  °3) swelling in the hands, feet or stomach  °4) if you have to sleep on extra pillows at night in order to breathe ° °Follow cardiac low salt diet and 1.5 lit/day fluid restriction. ° °If you have diabetes °Accuchecks 4 times/day, Once in AM empty stomach and then before each meal. °Log in all results and show them to your primary doctor at your next visit. °If any glucose reading is under 80 or above 300 call your primary MD immediately. ° °If you have Seizure/Convulsions/Epilepsy: °Please do not drive, operate heavy machinery, participate in activities at heights or participate in high speed sports until you have seen by Primary MD or a Neurologist and advised to do so again. ° °If you had Gastrointestinal Bleeding: °Please ask your Primary MD to check a complete blood count within one week of discharge or at your next visit. Your endoscopic/colonoscopic biopsies that are pending at the time of discharge, will also need to followed by your Primary MD. ° °Get Medicines reviewed and adjusted. °Please take all your medications with you for your next visit with your Primary MD ° °Please request your Primary MD to go over all hospital tests and procedure/radiological results at the follow up, please ask your  Primary MD to get all Hospital records sent to his/her office. ° °If you experience worsening of your admission symptoms, develop shortness of breath, life threatening emergency, suicidal or homicidal thoughts you must seek medical attention immediately by calling 911 or calling your MD immediately  if symptoms less severe. ° °You must read complete instructions/literature along with all the possible adverse reactions/side effects for all the Medicines you take and that have been prescribed to you. Take any new Medicines after you have completely understood and accpet all the possible adverse reactions/side effects.  ° °Do not drive or operate heavy machinery when taking Pain medications.  ° °Do not take more than prescribed Pain, Sleep and Anxiety Medications ° °Special Instructions: If you have smoked or chewed Tobacco  in the last 2 yrs please stop smoking, stop any regular Alcohol  and or any Recreational drug use. ° °Wear Seat belts while driving. ° °Please note °You were cared for by a hospitalist during your hospital stay. If you have any questions about your discharge medications or the care you received while you were in the hospital after you are discharged, you can call the unit and asked to speak with the hospitalist on call if the hospitalist that took care of you is not available. Once you are discharged, your primary care physician will handle any further medical issues. Please note that NO REFILLS for any discharge medications will be authorized once you are discharged, as it is imperative that you   return to your primary care physician (or establish a relationship with a primary care physician if you do not have one) for your aftercare needs so that they can reassess your need for medications and monitor your lab values.  You can reach the hospitalist office at phone [phone] or fax [phone]   If you do not have a primary care physician, you can call [phone] for a physician  referral.   Healthcare-Associated Pneumonia Healthcare-associated pneumonia is a lung infection that a person can get when in a health care setting or during certain procedures. The infection causes air sacs inside the lungs to fill with pus or fluid. Healthcare-associated pneumonia is usually caused by bacteria that are common in health care settings. These bacteria may be resistant to some antibiotic medicines. What are the causes? This condition is caused by bacteria that get into your lungs. You can get this condition if you:  Breathe in droplets from an infected person's cough or sneeze.  Touch something that an infected person coughed or sneezed on and then touch your mouth, nose, or eyes.  Have a bacterial infection somewhere else in your body, if the bacteria spread to your lungs through your blood.  What increases the risk? This condition is more likely to develop in people who:  Have a disease that weakens their body's defense system (immune system) or their ability to cough out germs.  Are older than age 89.  Having trouble swallowing.  Use a feeding or breathing tube.  Have a cold or the flu.  Have an IV tube inserted in a vein.  Have surgery.  Have a bed sore.  Live in a long-term care facility, such as a nursing home.  Were in the hospital for two or more days in the past 3 months.  Received hemodialysis in the past 30 days.  What are the signs or symptoms? Symptoms of this condition include:  Fever.  Chills.  Cough.  Shortness of breath.  Wheezing or crackling sounds when breathing.  How is this diagnosed? This condition may be diagnosed based on:  Your symptoms.  A chest X-ray.  A measurement of the amount of oxygen in your blood.  How is this treated? This condition is treated with antibiotics. Your health care provider may take a sample of cells (culture) from your throat to determine what type of bacteria is in your lungs and change  your antibiotic based on the results. If you have bacteria in your blood, trouble breathing, or a low oxygen level, you may need to be treated at the hospital. At the hospital, you will be given antibiotics through an IV tube. You may also be given oxygen or breathing treatments. Follow these instructions at home: Medicine  Take your antibiotic medicine as told by your health care provider. Do not stop taking the antibiotic even if you start to feel better.  Take over-the-counter and other prescription medicines only as told by your health care provider. Activity  Rest at home until you feel better.  Return to your normal activities as told by your health care provider. Ask your health care provider what activities are safe for you. General instructions  Drink enough fluid to keep your urine clear or pale yellow.  Do not use any products that contain nicotine or tobacco, such as cigarettes and e-cigarettes. If you need help quitting, ask your health care provider.  Limit alcohol intake to no more than 1 drink per day for nonpregnant women and 2 drinks  per day for men. One drink equals 12 oz of beer, 5 oz of wine, or 1 oz of hard liquor.  Keep all follow-up visits as told by your health care provider. This is important. How is this prevented? Actions that I can take To lower your risk of getting this condition again:  Do not smoke. This includes e-cigarettes.  Do not drink too much alcohol.  Keep your immune system healthy by eating well and getting enough sleep.  Get a flu shot every year (annually).  Get a pneumonia vaccination if: ? You are older than age 52. ? You smoke. ? You have a long-lasting condition like lung disease.  Exercise your lungs by taking deep breaths, walking, and using an incentive spirometer as directed.  Wash your hands often with soap and water. If you cannot get to a sink to wash your hands, use an alcohol-based hand cleaner.  Make sure your health  care providers are washing their hands. If you do not see them wash their hands, ask them to do so.  When you are in a health care facility, avoid touching your eyes, nose, and mouth.  Avoid touching any surface near where people have coughed or sneezed.  Stand away from sick people when they are coughing or sneezing.  Wear a mask if you cannot avoid exposure to people who are sick.  Clean all surfaces often with a disinfectant cleaner, especially if someone is sick at home or work.  Precautions of my health care team Hospitals, nursing homes, and other health care facilities take special care to try to prevent healthcare-associated pneumonia. To do this, your health care team may:  Clean their hands with soap and water or with alcohol-based hand sanitizer before and after seeing patients.  Wear gloves or masks during treatment.  Sanitize medical instruments, tubes, other equipment, and surfaces in patient rooms.  Raise (elevate) the head of your hospital bed so you are not lying flat. The head of the bed may be elevated 30 degrees or more.  Have you sit up and move around as soon as possible after surgery.  Only insert a breathing tube if needed.  Do these things for you if you have a breathing tube: ? Clean the inside of your mouth regularly. ? Remove the breathing tube as soon as it is no longer needed.  Contact a health care provider if:  Your symptoms do not get better or they get worse.  Your symptoms come back after you have finished taking your antibiotics. Get help right away if:  You have trouble breathing.  You have confusion or difficulty thinking. This information is not intended to replace advice given to you by your health care provider. Make sure you discuss any questions you have with your health care provider. Document Released: 01/20/2016 Document Revised: 06/15/2016 Document Reviewed: 05/28/2016 Elsevier Interactive Patient Education  [employer].

## 2018-02-13 NOTE — Consult Note (Signed)
[facility] Primary Care Navigator  02/13/2018  Blake [name] 05/23/1946 [id number]   Met withpatient at the bedsidetoidentify possible discharge needs.  Patientreportsthathe had"nausea, vomiting, weakness and coughing that resultedto this admission. (sepsis from healthcare acquired pneumonia, acute respiratory failure with hypoxia, acute toxic metabolic encephalopathy)  PatientendorsesDr.Ajith Ashby [name] with [place] care provider.   [facility] [employer] [facility]. He also mentioned using Mail Order service starting last week but unable to remember what company it is with.   [facility] use of "pill box" system filled every week.  Patient has been [place] [place] prior to admission but niece Blake [name]) will be able to provide transportationto hisdoctors' appointments if needed after discharge.  Patient lives at home with his niece (works). Patient reports that he was independent with self care prior to admission, but niece will provide assistance with his needs at home when needed upon discharge.  Anticipated plan for discharge isskilled nursing facility (SNF) per therapy recommendation.  Patientvoiced understandingto callprimarycareprovider'soffice when hereturns backhome,for a post discharge follow-upvisitwithin1- 2 weeksor sooner if needs arise.Patient letter (with PCP's contact number) was provided asareminder.   Explained topatientregarding [facility] CM services available for health management/ resourcesat home but he denies any current needs or concerns for now.He reports being capable of managing diabetes so far. Patientverbalizedunderstandingto seekreferral from primary care provider to [facility] care management ifdeemed necessary and appropriatefor anyservicesin the  nearfuture,once he returnsbackhome.   [facility] care management information was provided for futureneeds thathe may have.  Primary care provider's office is listed as providing transition of care (TOC) follow-up.    For additional questions please contact:  Edwena Felty A. Laure Leone, BSN, RN-BC [facility] PRIMARY CARE Navigator Cell: [phone]

## 2018-02-13 NOTE — Care Management Important Message (Signed)
Important Message  Patient Details  Name: Blake [name] MRN: [id number] Date of Birth: April 01, 1946   Medicare Important Message Given:  Yes    Orbie Pyo 02/13/2018, 3:39 PM

## 2018-02-13 NOTE — Progress Notes (Signed)
Pt prepared for d/c to SNF. IV d/c'd. Skin intact except as charted in most recent assessments. Vitals are stable. Report called to receiving facility. Pt to be transported by ambulance service. 

## 2018-02-13 NOTE — Discharge Summary (Signed)
Physician Discharge Summary  Blake [name] ZOX:[id number] DOB: March 30, 1946  PCP: [name], MD  Admit date: 02/07/2018 Discharge date: 02/13/2018  Recommendations for Outpatient Follow-up:  1. MD at SNF in 2-3 days.  Please follow periodic labs (CBC & renal panel) that can be drawn across dialysis. 2. Hemodialysis: Continue regular hemodialysis appointments on Tuesdays, Thursdays and Saturdays.  Next appointment on 02/14/2018. 3. Dr. [name], PCP upon discharge from SNF. 4. Recommend follow-up chest x-ray in 4 weeks to ensure resolution of pneumonia findings. 5. Wean off oxygen as tolerated for oxygen saturations >92%.  Home Health: N/A. Patient being discharged to [place], [place]. Equipment/Devices: Oxygen via nasal cannula at 2 L/min.  Discharge Condition: Improved and stable. CODE STATUS: Full Diet recommendation: Heart healthy & diabetic diet.  Discharge Diagnoses:  Principal Problem:   Sepsis ([place]) Active Problems:   ESRD on dialysis ([place])   Depression   Encephalopathy, metabolic   Nausea and vomiting   Thrombocytopenia ([name])   Type II diabetes mellitus with renal manifestations ([name])   Hypocalcemia   Hypokalemia   Anemia   Hypomagnesemia   Brief Summary: 72 year old male, lives with family, PMH of ESRD on TTS HD, HTN, HLD, DM 2, GERD, depression, chronic diastolic CHF, presented to ED with altered mental status, nausea, vomiting and generalized weakness.  As per EMS, hypoxic at 87% on room air, initial blood pressure 72/44 which improved to 98/50 after 2 L normal saline IV.  Admitted for suspected sepsis.    Assessment & Plan:   Suspected sepsis from HCAP: Met sepsis criteria on admission with hypotension, fever and tachypnea.  Lactate elevated at 2.13 and trended up to 4.  Hypotension responded to IV fluids.  CT abdomen and pelvis showed bibasilar infiltration concerning for pneumonia, aspiration pneumonia possible due to  altered mental status.  Does make intermittent urine.  Empirically started on IV vancomycin and Zosyn. Procalcitonin elevated: 51.6.  Urine microscopy not indicative of UTI.  MRSA PCR negative.  Discontinued vancomycin.  Patient has completed approximately 6 days of IV antibiotics/Zosyn.  Transitioned to oral Augmentin to complete total 8 days of antibiotics after 02/14/2018 doses. 1 of 2 blood cultures returned positive for Clostridium Clostridioforme which I discussed with infectious disease MD on call today who indicated that this is likely a contaminant and treatment will not be pursued for this.  Second blood culture was negative, final report.  Improved.  Recommend repeating chest x-ray in 4 weeks to ensure resolution of pneumonia findings.  Acute respiratory failure with hypoxia: Likely related to pneumonia.  Incentive spirometry.  Wean oxygen as tolerated.    Remains on 2 L of oxygen via nasal cannula.?  Underlying ILD, outpatient follow-up and evaluation as deemed necessary.  Acute toxic metabolic encephalopathy: CT head negative for acute findings.  No focal neurological deficits.  Likely related to acute infection.    Resolved.  Thrombocytopenia: May be related to infection. Low index of suspicion for TTP.  Resolved on labs 6/1.  ESRD on TTS HD: Nephrology follow-up appreciated.  Patient underwent hemodialysis on 5/30.  Underwent inpatient HD on 6/1.    Nephrology follow-up appreciated.  I discussed with nephrology and they have cleared him for discharge to SNF to continue outpatient dialysis beginning 6/4.  They recommended resuming calcium acetate and Renvela at discharge.  Patient to get calcitriol, Aranesp, IV iron across dialysis as deemed necessary.  Depression: Home medications were initially held.  Pharmacy verified medications again and resumed prior home dose  of Paxil on 5/31.  Stable.  Essential hypertension: Antihypertensives held due to initial hypotension.  Hypotension  resolved and blood pressures normal at this time and hence amlodipine not resumed at discharge.  Close outpatient follow-up.  Type II DM with renal complications: Last R7V 6 on 07/02/2014.  Mildly uncontrolled and fluctuating.  Continue SSI.  Stable.  Hypokalemia: Replaced.  Hypomagnesemia: Replaced.  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  Stable in the 10 g range.  Nausea and vomiting: Unclear etiology.  CT abdomen and pelvis without acute findings.  Resolved.  Tolerating diet.  1 of 2 blood cultures positive Clostridium Clostridioforme:  Most likely contaminant.  Please see discussion above.   Consultants:  Nephrology  Procedures:  HD    Discharge Instructions  Discharge Instructions    (HEART FAILURE PATIENTS) Call MD:  Anytime you have any of the following symptoms: 1) 3 pound weight gain in 24 hours or 5 pounds in 1 week 2) shortness of breath, with or without a dry hacking cough 3) swelling in the hands, feet or stomach 4) if you have to sleep on extra pillows at night in order to breathe.   Complete by:  As directed    Call MD for:   Complete by:  As directed    Recurrent confusion or altered mental status.   Call MD for:  difficulty breathing, headache or visual disturbances   Complete by:  As directed    Call MD for:  extreme fatigue   Complete by:  As directed    Call MD for:  persistant dizziness or light-headedness   Complete by:  As directed    Call MD for:  persistant nausea and vomiting   Complete by:  As directed    Call MD for:  severe uncontrolled pain   Complete by:  As directed    Call MD for:  temperature >100.4   Complete by:  As directed    Diet - low sodium heart healthy   Complete by:  As directed    Diet Carb Modified   Complete by:  As directed    Increase activity slowly   Complete by:  As directed        Medication List    STOP taking these medications   amLODipine 10 MG tablet Commonly known as:  NORVASC   buPROPion 150 MG 24  hr tablet Commonly known as:  WELLBUTRIN XL     TAKE these medications   amoxicillin-clavulanate 500-125 MG tablet Commonly known as:  AUGMENTIN Take 1 tablet (500 mg total) by mouth daily. Discontinue after 02/14/2018 dose.   aspirin EC 81 MG tablet Take 81 mg by mouth daily.   atorvastatin 20 MG tablet Commonly known as:  LIPITOR Take 20 mg by mouth daily.   CALCIUM ACETATE PO Take 167 mg by mouth 3 (three) times daily.   insulin regular 100 units/mL injection Commonly known as:  NOVOLIN R RELION Inject 0-0.09 mLs (0-9 Units total) into the skin 3 (three) times daily with meals. CBG < 70: implement hypoglycemia protocol CBG 70 - 120: 0 units CBG 121 - 150: 1 unit CBG 151 - 200: 2 units CBG 201 - 250: 3 units CBG 251 - 300: 5 units CBG 301 - 350: 7 units CBG 351 - 400: 9 units CBG > 400: call MD. What changed:    medication strength  how much to take  when to take this  reasons to take this  additional instructions   multivitamin Tabs  tablet Take 1 tablet by mouth daily.   NEPHRO VITAMINS 0.8 MG Tabs Take 0.8 mg by mouth. Dialysis Tuesday, Thursday and Saturday   pantoprazole 20 MG tablet Commonly known as:  PROTONIX Take 1 tablet (20 mg total) by mouth daily. What changed:    how much to take  when to take this   PARoxetine 10 MG tablet Commonly known as:  PAXIL Take 10 mg by mouth daily.   ranitidine 150 MG tablet Commonly known as:  ZANTAC Take 150 mg by mouth daily.   sevelamer carbonate 800 MG tablet Commonly known as:  RENVELA Take 1 tablet (800 mg total) by mouth 3 (three) times daily with meals.      Follow-up Information    [name], MD. Schedule an appointment as soon as possible for a visit.   Specialty:  Internal Medicine Why:  Upon discharge from SNF. Contact information: [address] [place] [address] [phone]        MD at SNF. Schedule an appointment as soon as possible for a visit.    Why:  To be seen in 2 to 3 days.  Follow-up on labs (CBC & renal panel) which should be periodically drawn across hemodialysis.       Hemodialysis Center Follow up.   Why:  Continue regular hemodialysis on Tuesdays, Thursdays and Saturdays.  Next appointment on 02/14/2018.         No Known Allergies    Procedures/Studies: Ct Abdomen Pelvis Wo Contrast  Result Date: 02/08/2018 CLINICAL DATA:  Acute onset of generalized weakness, nausea and vomiting. EXAM: CT ABDOMEN AND PELVIS WITHOUT CONTRAST TECHNIQUE: Multidetector CT imaging of the abdomen and pelvis was performed following the standard protocol without IV contrast. COMPARISON:  Right upper quadrant ultrasound performed 04/21/2016 FINDINGS: Lower chest: Bibasilar airspace opacities raise concern for pneumonia, possibly atypical in nature. Underlying mild fibrotic change is noted. The visualized portions of the mediastinum are grossly unremarkable. Scattered coronary artery calcifications are seen. Hepatobiliary: The liver is unremarkable in appearance. The gallbladder is unremarkable in appearance. The common bile duct remains normal in caliber. Pancreas: The pancreas is within normal limits. Spleen: The spleen is unremarkable in appearance. Adrenals/Urinary Tract: The adrenal glands are unremarkable in appearance. Moderate bilateral renal atrophy is noted. Scattered bilateral renal cysts are seen, hypodense and hyperdense in nature. Nonspecific perinephric stranding is noted bilaterally. There is no evidence of hydronephrosis. No renal or ureteral stones are identified Stomach/Bowel: The stomach is unremarkable in appearance. The small bowel is within normal limits. The appendix is normal in caliber, without evidence of appendicitis. The colon is unremarkable in appearance. Vascular/Lymphatic: The abdominal aorta is unremarkable in appearance. The inferior vena cava is grossly unremarkable. No retroperitoneal lymphadenopathy is seen. No pelvic  sidewall lymphadenopathy is identified. Reproductive: The bladder is decompressed. Soft tissue inflammation about the bladder could reflect cystitis. The prostate is enlarged, measuring 5.2 cm in transverse dimension. Other: No additional soft tissue abnormalities are seen. Musculoskeletal: No acute osseous abnormalities are identified. Vacuum phenomenon is noted at L5-S1. The visualized musculature is unremarkable in appearance. IMPRESSION: 1. Bibasilar airspace opacities raise concern for pneumonia, possibly atypical in nature. Underlying mild fibrotic change noted. 2. Soft tissue inflammation about the bladder could reflect cystitis. 3. Moderate bilateral renal atrophy noted. Scattered bilateral renal cysts, hypodense and hyperdense in nature. 4. Scattered coronary artery calcifications seen. 5. Enlarged prostate noted. Electronically Signed   By: Garald Balding M.D.   On: 02/08/2018 01:24  Ct Head Wo Contrast  Result Date: 02/08/2018 CLINICAL DATA:  Acute onset of generalized weakness and lethargy. EXAM: CT HEAD WITHOUT CONTRAST TECHNIQUE: Contiguous axial images were obtained from the base of the skull through the vertex without intravenous contrast. COMPARISON:  CT of the head performed 08/01/2015 FINDINGS: Brain: No evidence of acute infarction, hemorrhage, hydrocephalus, extra-axial collection or mass lesion / mass effect. Prominence of the ventricles and sulci reflects mild cortical volume loss. Scattered periventricular and subcortical white matter change likely reflects small vessel ischemic microangiopathy. The brainstem and fourth ventricle are within normal limits. The basal ganglia are unremarkable in appearance. The cerebral hemispheres demonstrate grossly normal gray-white differentiation. No mass effect or midline shift is seen. Vascular: No hyperdense vessel or unexpected calcification. Skull: There is no evidence of fracture; visualized osseous structures are unremarkable in appearance.  Sinuses/Orbits: The orbits are within normal limits. The paranasal sinuses and mastoid air cells are well-aerated. Other: No significant soft tissue abnormalities are seen. IMPRESSION: 1. No acute intracranial pathology seen on CT. 2. Mild cortical volume loss and scattered small vessel ischemic microangiopathy. Electronically Signed   By: Garald Balding M.D.   On: 02/08/2018 01:17   Dg Chest Portable 1 View  Result Date: 02/07/2018 CLINICAL DATA:  Weakness.  Vomiting. EXAM: PORTABLE CHEST 1 VIEW COMPARISON:  March 02, 2017 FINDINGS: Stable cardiomegaly. The hila and mediastinum are unchanged. No pneumothorax. Diffuse interstitial opacities in the lungs are similar to the previous study. No nodule or mass. No focal infiltrate. IMPRESSION: Cardiomegaly. Persistent interstitial opacities could represent recurrent edema versus interstitial lung disease. Recommend clinical correlation. Electronically Signed   By: Dorise Bullion III M.D   On: 02/07/2018 22:14      Subjective: Seen this morning.  Denies complaints.  No cough, fever, chills, dyspnea or chest pain.  Denies any other complaints.  Declined offer to speak with family to update care.  As per RN, no acute issues noted.  Discharge Exam:  Vitals:   02/12/18 2047 02/13/18 0619 02/13/18 1312 02/13/18 1313  BP: (!) 131/58 (!) 130/55 (!) 134/54 (!) 134/54  Pulse: 67 (!) 113 60 63  Resp: '16 18 18   ' Temp: 97.9 F (36.6 C) 97.8 F (36.6 C) 98.1 F (36.7 C) 98.1 F (36.7 C)  TempSrc: Oral Oral  Oral  SpO2: 97% 96%  95%  Weight:      Height:        General exam: Pleasant elderly male, moderately built and nourished lying comfortably propped up in bed.  Oral mucosa moist.   Respiratory system: Slightly diminished breath sounds in the bases but otherwise clear to auscultation.  No wheezing, rhonchi or crackles. No increased work of breathing. Cardiovascular system: S1 & S2 heard, RRR. No JVD, murmurs, rubs, gallops or clicks. No pedal edema.    Gastrointestinal system: Abdomen is nondistended, soft and nontender. No organomegaly or masses felt. Normal bowel sounds heard.  Central nervous system: Alert and oriented x3. No focal neurological deficits.   Extremities: Symmetric 5 x 5 power.  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  Right great toe amputated.   Skin: No rashes, lesions or ulcers Psychiatry: Judgement and insight appear normal. Mood & affect appropriate.       The results of significant diagnostics from this hospitalization (including imaging, microbiology, ancillary and laboratory) are listed below for reference.     Microbiology: Recent Results (from the past 240 hour(s))  Blood culture (routine x 2)     Status: Abnormal  Collection Time: 02/07/18  9:35 PM  Result Value Ref Range Status   Specimen Description BLOOD LEFT ANTECUBITAL  Final   Special Requests   Final    BOTTLES DRAWN AEROBIC AND ANAEROBIC Blood Culture adequate volume   Culture  Setup Time   Final    ANAEROBIC BOTTLE ONLY GRAM POSITIVE RODS CRITICAL RESULT CALLED TO, READ BACK BY AND VERIFIED WITHAlona Bene PHARMD 02/11/18 0101 JDW Performed at [facility], [address] [place], [place] [address]    Culture CLOSTRIDIUM CLOSTRIDIOFORME (A)  Final   Report Status 02/13/2018 FINAL  Final  Blood Culture ID Panel (Reflexed)     Status: None   Collection Time: 02/07/18  9:35 PM  Result Value Ref Range Status   Enterococcus species NOT DETECTED NOT DETECTED Final   Listeria monocytogenes NOT DETECTED NOT DETECTED Final   Staphylococcus species NOT DETECTED NOT DETECTED Final    Comment: CRITICAL RESULT CALLED TO, READ BACK BY AND VERIFIED WITH: V BRYK PHARMD 02/11/18 0101 JDW    Staphylococcus aureus NOT DETECTED NOT DETECTED Final   Streptococcus species NOT DETECTED NOT DETECTED Final   Streptococcus agalactiae NOT DETECTED NOT DETECTED Final   Streptococcus pneumoniae NOT DETECTED NOT DETECTED Final   Streptococcus pyogenes NOT  DETECTED NOT DETECTED Final   Acinetobacter baumannii NOT DETECTED NOT DETECTED Final   Enterobacteriaceae species NOT DETECTED NOT DETECTED Final   Enterobacter cloacae complex NOT DETECTED NOT DETECTED Final   Escherichia coli NOT DETECTED NOT DETECTED Final   Klebsiella oxytoca NOT DETECTED NOT DETECTED Final   Klebsiella pneumoniae NOT DETECTED NOT DETECTED Final   Proteus species NOT DETECTED NOT DETECTED Final   Serratia marcescens NOT DETECTED NOT DETECTED Final   Haemophilus influenzae NOT DETECTED NOT DETECTED Final   Neisseria meningitidis NOT DETECTED NOT DETECTED Final   Pseudomonas aeruginosa NOT DETECTED NOT DETECTED Final   Candida albicans NOT DETECTED NOT DETECTED Final   Candida glabrata NOT DETECTED NOT DETECTED Final   Candida krusei NOT DETECTED NOT DETECTED Final   Candida parapsilosis NOT DETECTED NOT DETECTED Final   Candida tropicalis NOT DETECTED NOT DETECTED Final  Blood culture (routine x 2)     Status: None   Collection Time: 02/07/18  9:50 PM  Result Value Ref Range Status   Specimen Description BLOOD LEFT HAND  Final   Special Requests   Final    BOTTLES DRAWN AEROBIC AND ANAEROBIC Blood Culture adequate volume   Culture   Final    NO GROWTH 5 DAYS Performed at [facility], [address], [place], [place] [address]    Report Status 02/12/2018 FINAL  Final  MRSA PCR Screening     Status: None   Collection Time: 02/08/18 11:23 PM  Result Value Ref Range Status   MRSA by PCR NEGATIVE NEGATIVE Final    Comment:        The GeneXpert MRSA Assay (FDA approved for NASAL specimens only), is one component of a comprehensive MRSA colonization surveillance program. It is not intended to diagnose MRSA infection nor to guide or monitor treatment for MRSA infections. Performed at [facility], [place] [address], [place], [place] [address]      Labs: CBC: Recent Labs  Lab 02/07/18 2145 02/08/18 0022 02/08/18 0255 02/09/18 0642  02/11/18 0739  WBC 7.2 10.8* 13.0* 13.6* 7.0  NEUTROABS 6.1 8.7*  --   --   --   HGB 8.2* 9.9* 10.8* 10.0* 10.9*  HCT 26.5* 32.1* 35.1* 31.4* 34.1*  MCV 94.6 93.9 93.4 91.0 89.7  PLT 108* 136* 131* 134* 062   Basic Metabolic Panel: Recent Labs  Lab 02/07/18 2145 02/07/18 2345 02/08/18 0022 02/08/18 0255 02/09/18 0642 02/11/18 0739  NA 145  --  140 139 139 140  K 2.3*  --  5.0 4.4 4.6 4.1  CL 118*  --  103 102 104 102  CO2 18*  --  '24 23 23 24  ' GLUCOSE 150*  --  185* 190* 97 165*  BUN 25*  --  35* 40* 62* 45*  CREATININE 4.56*  --  6.98* 7.25* 9.42* 8.24*  CALCIUM 5.9*  --  9.1 9.0 8.4* 8.6*  MG  --  1.0*  --  1.6* 1.8  --   PHOS  --   --   --   --  4.4 5.8*   Liver Function Tests: Recent Labs  Lab 02/07/18 2145 02/08/18 0022 02/09/18 0642 02/11/18 0739  AST 18 25  --   --   ALT 13* 17  --   --   ALKPHOS 52 70  --   --   BILITOT 0.6 1.2  --   --   PROT 4.2* 6.0*  --   --   ALBUMIN 2.0* 2.7* 2.7* 2.6*   Cardiac Enzymes: Recent Labs  Lab 02/08/18 0255  TROPONINI <0.03   CBG: Recent Labs  Lab 02/12/18 1237 02/12/18 1713 02/12/18 2215 02/13/18 0752 02/13/18 1208  GLUCAP 177* 188* 163* 176* 147*     Time coordinating discharge: 40 minutes  SIGNED:  Vernell Leep, MD, FACP, [facility]. [facility] Pager [phone]  If 7PM-7AM, please contact night-coverage [email or web address] Password [email or web address] 02/13/2018, 1:32 PM

## 2018-02-13 NOTE — Progress Notes (Signed)
Subjective: Interval History: no c/o  Objective: Vital signs in last 24 hours: Temp:  [97.8 F (36.6 C)-98.5 F (36.9 C)] 97.8 F (36.6 C) (06/03 0619) Pulse Rate:  [67-113] 113 (06/03 0619) Resp:  [16-18] 18 (06/03 0619) BP: (110-131)/(55-73) 130/55 (06/03 0619) SpO2:  [96 %-97 %] 96 % (06/03 0619) Weight change:   Intake/Output from previous day: 06/02 0701 - 06/03 0700 In: 350 [P.O.:350] Out: 50 [Urine:50] Intake/Output this shift: No intake/output data recorded.  General appearance: no distress, moderately obese and difficult to arouse Resp: diminished breath sounds RLL and rales RLL Cardio: S1, S2 normal and systolic murmur: systolic ejection 2/6, decrescendo at 2nd left intercostal space GI: soft, non-tender; bowel sounds normal; no masses,  no organomegaly Extremities: AVF RUA  Lab Results: Recent Labs    02/11/18 0739  WBC 7.0  HGB 10.9*  HCT 34.1*  PLT 157   BMET:  Recent Labs    02/11/18 0739  NA 140  K 4.1  CL 102  CO2 24  GLUCOSE 165*  BUN 45*  CREATININE 8.24*  CALCIUM 8.6*   No results for input(s): PTH in the last 72 hours. Iron Studies: No results for input(s): IRON, TIBC, TRANSFERRIN, FERRITIN in the last 72 hours.  Studies/Results: No results found.  I have reviewed the patient's current medications.  Assessment/Plan: 1 ESRD for HD tomorrow 2 Pneu AB 3 Anemia esa 4 HPTH vi t D 5 DM  P HD, esa, ab, ? When d/c   LOS: 5 days   Jeneen Rinks Shanquita Ronning 02/13/2018,9:33 AM

## 2018-02-13 NOTE — Clinical Social Work Placement (Signed)
   CLINICAL SOCIAL WORK PLACEMENT  NOTE  Date:  02/13/2018  Patient Details  Name: Blake [name] MRN: [id number] Date of Birth: [birth year]  Clinical Social Work is seeking post-discharge placement for this patient at the [place] level of care (*CSW will initial, date and re-position this form in  chart as items are completed):  Yes   Patient/family provided with [place] Work Department's list of facilities offering this level of care within the geographic area requested by the patient (or if unable, by the patient's family).  Yes   Patient/family informed of their freedom to choose among providers that offer the needed level of care, that participate in Medicare, Medicaid or managed care program needed by the patient, have an available bed and are willing to accept the patient.  Yes   Patient/family informed of [facility]'s ownership interest in [facility] and [facility], as well as of the fact that they are under no obligation to receive care at these facilities.  PASRR submitted to EDS on       PASRR number received on       Existing PASRR number confirmed on 02/13/18     FL2 transmitted to all facilities in geographic area requested by pt/family on 02/13/18     FL2 transmitted to all facilities within larger geographic area on       Patient informed that his/her managed care company has contracts with or will negotiate with certain facilities, including the following:        Yes   Patient/family informed of bed offers received.  Patient chooses bed at [place] [facility]     Physician recommends and patient chooses bed at      Patient to be transferred to [facility] on 02/13/18.  Patient to be transferred to facility by [facility]     Patient family notified on 02/13/18 of transfer.  Name of family member notified:  Patient declined     PHYSICIAN       Additional Comment:     _______________________________________________ Benard Halsted, LCSWA 02/13/2018, 1:09 PM

## 2018-02-13 NOTE — Progress Notes (Signed)
CSW provided bed offers to patient. He has selected [facility]. 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 Facility will start insurance authorization process.   Percell Locus Jessamine Barcia LCSW [phone]

## 2018-02-14 ENCOUNTER — Non-Acute Institutional Stay (SKILLED_NURSING_FACILITY): Payer: Medicare Other | Admitting: Adult Health

## 2018-02-14 ENCOUNTER — Encounter: Payer: Self-pay | Admitting: Adult Health

## 2018-02-14 DIAGNOSIS — I120 Hypertensive chronic kidney disease with stage 5 chronic kidney disease or end stage renal disease: Secondary | ICD-10-CM | POA: Diagnosis not present

## 2018-02-14 DIAGNOSIS — N186 End stage renal disease: Secondary | ICD-10-CM | POA: Diagnosis not present

## 2018-02-14 DIAGNOSIS — E785 Hyperlipidemia, unspecified: Secondary | ICD-10-CM

## 2018-02-14 DIAGNOSIS — F3289 Other specified depressive episodes: Secondary | ICD-10-CM

## 2018-02-14 DIAGNOSIS — E1122 Type 2 diabetes mellitus with diabetic chronic kidney disease: Secondary | ICD-10-CM | POA: Diagnosis not present

## 2018-02-14 DIAGNOSIS — E1169 Type 2 diabetes mellitus with other specified complication: Secondary | ICD-10-CM

## 2018-02-14 DIAGNOSIS — D689 Coagulation defect, unspecified: Secondary | ICD-10-CM | POA: Diagnosis not present

## 2018-02-14 DIAGNOSIS — N2581 Secondary hyperparathyroidism of renal origin: Secondary | ICD-10-CM | POA: Diagnosis not present

## 2018-02-14 DIAGNOSIS — K219 Gastro-esophageal reflux disease without esophagitis: Secondary | ICD-10-CM | POA: Diagnosis not present

## 2018-02-14 DIAGNOSIS — D631 Anemia in chronic kidney disease: Secondary | ICD-10-CM | POA: Diagnosis not present

## 2018-02-14 DIAGNOSIS — Z992 Dependence on renal dialysis: Secondary | ICD-10-CM

## 2018-02-14 DIAGNOSIS — E1129 Type 2 diabetes mellitus with other diabetic kidney complication: Secondary | ICD-10-CM | POA: Diagnosis not present

## 2018-02-14 NOTE — Progress Notes (Signed)
Location:   [facility] Room Number: 118 A Place of Service:  SNF (31)   CODE STATUS: Full Code  No Known Allergies  Chief Complaint  Patient presents with  . Hospitalization Follow-up    Hospital follow up    HPI:  He is a 72 year old who has been hospitalized from 02-07-18 through 02-13-18 for suspect sepsis from HCAP. He was treated with IV vanc and zosyn and transitioned to augmentin. He has completed his abt. One of two blodd cultures were positive felt to be contaminant. He did have acute respiratory failure with hypoxia more than likely related to pneumonia. He will need to be weaned off 02 as tolerated. He continues on hemodialysis three times weekly. He is here for short term rehab with his goal to return back home. He denies any cough; shortness of breath or fatigue. He will continue to be followed for his chronic illnesses including:  Diabetes; dyslipidemia; gerd. There are no nursing concerns at this time.    Past Medical History:  Diagnosis Date  . Acute osteomyelitis of toe of right foot ([place]) 08/02/2015  . Arthritis    "maybe RUE/hand" (02/08/2018)  . ESRD (end stage renal disease) on dialysis [facility])    "TTS; [place]" (02/08/2018)  . Hyperlipidemia   . Hypertension   . Type II diabetes mellitus ([place])     Past Surgical History:  Procedure Laterality Date  . AMPUTATION TOE Right 08/04/2015   Procedure: RIGHT GREAT TOE AMPUTATION;  Surgeon: [name], MD;  Location: [place];  Service: Orthopedics;  Laterality: Right;  . AV FISTULA PLACEMENT Right 07/05/2014   Procedure: ARTERIOVENOUS BRACHIALCEPHALIC (AV) FISTULA CREATION;  Surgeon: Conrad [place], MD;  Location: [place];  Service: Vascular;  Laterality: Right;  . BACK SURGERY    . ESOPHAGOGASTRODUODENOSCOPY (EGD) WITH PROPOFOL N/A 11/28/2015   Procedure: ESOPHAGOGASTRODUODENOSCOPY (EGD) WITH PROPOFOL;  Surgeon: [name], MD;  [facility];  Service: Endoscopy;  Laterality: N/A;  .  INSERTION OF DIALYSIS CATHETER Left 07/05/2014   Procedure: INSERTION OF DIALYSIS CATHETER-LEFT INTERNAL JUGULAR PLACEMENT;  Surgeon: Conrad Arnold Line, MD;  Location: [place];  Service: Vascular;  Laterality: Left;  . LUMBAR DISC SURGERY    . REMOVAL OF A DIALYSIS CATHETER Right 07/05/2014   Procedure: REMOVAL OF A DIALYSIS CATHETER;  Surgeon: Conrad Courtland, MD;  Location: [place];  Service: Vascular;  Laterality: Right;    Social History   Socioeconomic History  . Marital status: Single    Spouse name: Not on file  . Number of children: Not on file  . Years of education: Not on file  . Highest education level: Not on file  Occupational History  . Not on file  Social Needs  . Financial resource strain: Not on file  . Food insecurity:    Worry: Not on file    Inability: Not on file  . Transportation needs:    Medical: Not on file    Non-medical: Not on file  Tobacco Use  . Smoking status: Former Smoker    Packs/day: 0.50    Years: 5.00    Pack years: 2.50    Types: Cigarettes    Last attempt to quit: 08/24/1979    Years since quitting: 38.5  . Smokeless tobacco: Never Used  Substance and Sexual Activity  . Alcohol use: Not Currently    Alcohol/week: 0.0 oz    Comment: 02/08/2018 "stopped in my 50's"  . Drug use: Never  .  Sexual activity: Not Currently  Lifestyle  . Physical activity:    Days per week: Not on file    Minutes per session: Not on file  . Stress: Not on file  Relationships  . Social connections:    Talks on phone: Not on file    Gets together: Not on file    Attends religious service: Not on file    Active member of club or organization: Not on file    Attends meetings of clubs or organizations: Not on file    Relationship status: Not on file  . Intimate partner violence:    Fear of current or ex partner: Not on file    Emotionally abused: Not on file    Physically abused: Not on file    Forced sexual activity: Not on file  Other Topics Concern  . Not on  file  Social History Narrative  . Not on file   Family History  Problem Relation Age of Onset  . Diabetes Father   . Heart attack Father       VITAL SIGNS BP 118/70   Pulse 67   Temp 97.6 F (36.4 C)   Resp 20   Ht 5\' 8"  (1.727 m)   Wt 209 lb 14 oz (95.2 kg)   BMI 31.91 kg/m   Outpatient Encounter Medications as of 02/14/2018  Medication Sig  . aspirin EC 81 MG tablet Take 81 mg by mouth daily.  Marland Kitchen atorvastatin (LIPITOR) 20 MG tablet Take 20 mg by mouth daily.  . B Complex-C-Folic Acid (NEPHRO VITAMINS) 0.8 MG TABS Take 0.8 mg by mouth. Dialysis Tuesday, Thursday and Saturday  . Calcium Acetate, Phos Binder, (CALCIUM ACETATE PO) Take 167 mg by mouth 3 (three) times daily.   . insulin regular (NOVOLIN R RELION) 100 units/mL injection Inject 0-0.09 mLs (0-9 Units total) into the skin 3 (three) times daily with meals. CBG < 70: implement hypoglycemia protocol CBG 70 - 120: 0 units CBG 121 - 150: 1 unit CBG 151 - 200: 2 units CBG 201 - 250: 3 units CBG 251 - 300: 5 units CBG 301 - 350: 7 units CBG 351 - 400: 9 units CBG > 400: call MD.  . multivitamin (RENA-VIT) TABS tablet Take 1 tablet by mouth daily.  . pantoprazole (PROTONIX) 20 MG tablet Take 20 mg by mouth daily.  Marland Kitchen PARoxetine (PAXIL) 10 MG tablet Take 10 mg by mouth daily.  . ranitidine (ZANTAC) 150 MG tablet Take 150 mg by mouth daily.   . sevelamer carbonate (RENVELA) 800 MG tablet Take 1 tablet (800 mg total) by mouth 3 (three) times daily with meals.  . [DISCONTINUED] amoxicillin-clavulanate (AUGMENTIN) 500-125 MG tablet Take 1 tablet (500 mg total) by mouth daily. Discontinue after 02/14/2018 dose. (Patient not taking: Reported on 02/14/2018)  . [DISCONTINUED] pantoprazole (PROTONIX) 20 MG tablet Take 1 tablet (20 mg total) by mouth daily. (Patient not taking: Reported on 02/14/2018)   No facility-administered encounter medications on file as of 02/14/2018.      SIGNIFICANT DIAGNOSTIC EXAMS  TODAY:   02-07-18: chest  x-ray: Cardiomegaly. Persistent interstitial opacities could represent recurrent edema versus interstitial lung disease. Recommend clinical Correlation.  02-08-18: ct of head: 1. No acute intracranial pathology seen on CT. 2. Mild cortical volume loss and scattered small vessel ischemic Microangiopathy.  02-08-18: ct of abdomen and pelvis: 1. Bibasilar airspace opacities raise concern for pneumonia, possibly atypical in nature. Underlying mild fibrotic change noted. 2. Soft tissue inflammation about the bladder  could reflect cystitis. 3. Moderate bilateral renal atrophy noted. Scattered bilateral renal cysts, hypodense and hyperdense in nature. 4. Scattered coronary artery calcifications seen. 5. Enlarged prostate noted.    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  Respiratory: Negative for cough and shortness of breath.   Cardiovascular: Negative for chest pain, palpitations and leg swelling.  Gastrointestinal: Negative for abdominal pain, constipation and heartburn.  Musculoskeletal: Negative for back pain, joint pain and myalgias.  Skin: Negative.   Neurological: Negative for dizziness.  Psychiatric/Behavioral: The patient is not nervous/anxious.      Physical Exam  Constitutional: He is oriented to person, place, and time. He appears well-developed and well-nourished. No distress.  Neck: No thyromegaly present.  Cardiovascular: Normal rate, regular rhythm and intact distal pulses.  Murmur heard. 1/6    Pulmonary/Chest: Effort normal and breath sounds normal. No respiratory distress.  02 dependent at this time   Abdominal: Soft. Bowel sounds are normal. He exhibits no distension. There is no tenderness.  Musculoskeletal: Normal range of motion. He exhibits no edema.  Status post right great toe amputation   Lymphadenopathy:    He has no cervical adenopathy.  Neurological: He is alert and oriented to person, place, and time.  Skin: Skin is warm and dry. He is not diaphoretic.  Right upper extremity A/V fistula + thrill + bruitt   Psychiatric: He has a normal mood and affect.    ASSESSMENT/ PLAN:  TODAY:   1. Dyslipidemia associated with type 2 diabetes mellitus: stable will continue lipitor 20 mg daily   2. End stage renal disease on dialysis due to type diabetes mellitus: stable; is followed by nephrology; is on hemodialysis three times weekly; will continue calcium acetate 667 mg three times daily and renvela 800 mg three times daily   3. Type 2 diabetes mellitus with hypertension and end stage renal disease on dialysis: is stable will continue Novolin R SSI: 121-150: 1 unit; 151-200: 2 units; 201-250: 3 units; 251-300: 5 units: 301-350: 7 units; 351-400: 9 units; asa 81 mg daily   4. GERD without esophagitis: stable will continue zantac 150 mg daily and protonix 20 mg daily   5. Depression: stable will continue paxil 10 mg daily   6. HCAP: stable has completed augmentin today; has hypoxia: is stable will continue 02 at 2L/[place] and will wean as tolerated.   MD is aware of resident's narcotic use and is in agreement with current plan of care. We will attempt to wean resident as apropriate   Ok Edwards NP [facility]  Contact [phone] Monday through Friday 8am- 5pm  After hours call [phone]

## 2018-02-15 ENCOUNTER — Encounter: Payer: Self-pay | Admitting: Adult Health

## 2018-02-15 ENCOUNTER — Non-Acute Institutional Stay (SKILLED_NURSING_FACILITY): Payer: Medicare Other | Admitting: Adult Health

## 2018-02-15 DIAGNOSIS — N186 End stage renal disease: Secondary | ICD-10-CM | POA: Diagnosis not present

## 2018-02-15 DIAGNOSIS — E1122 Type 2 diabetes mellitus with diabetic chronic kidney disease: Secondary | ICD-10-CM | POA: Diagnosis not present

## 2018-02-15 DIAGNOSIS — Z992 Dependence on renal dialysis: Secondary | ICD-10-CM

## 2018-02-15 DIAGNOSIS — A419 Sepsis, unspecified organism: Secondary | ICD-10-CM | POA: Diagnosis not present

## 2018-02-15 NOTE — Progress Notes (Signed)
Location:   [facility] Room Number: 118 A Place of Service:  SNF (31)   CODE STATUS: Full Code  No Known Allergies  Chief Complaint  Patient presents with  . Acute Visit    48 Hour Care Plan Meeting    HPI:  We have come together for his care plan meeting. He states that he lives at home with family and the goal of his care is to return back home. He denies any chest pain; he continues to need 02 at this time. He denies any uncontrolled pain; no cough. There are no nursing concerns at this time.    Past Medical History:  Diagnosis Date  . Acute osteomyelitis of toe of right foot ([place]) 08/02/2015  . Arthritis    "maybe RUE/hand" (02/08/2018)  . ESRD (end stage renal disease) on dialysis [facility])    "TTS; [place]" (02/08/2018)  . Hyperlipidemia   . Hypertension   . Type II diabetes mellitus ([place])     Past Surgical History:  Procedure Laterality Date  . AMPUTATION TOE Right 08/04/2015   Procedure: RIGHT GREAT TOE AMPUTATION;  Surgeon: [name], MD;  Location: [place];  Service: Orthopedics;  Laterality: Right;  . AV FISTULA PLACEMENT Right 07/05/2014   Procedure: ARTERIOVENOUS BRACHIALCEPHALIC (AV) FISTULA CREATION;  Surgeon: Conrad [place], MD;  Location: [place];  Service: Vascular;  Laterality: Right;  . BACK SURGERY    . ESOPHAGOGASTRODUODENOSCOPY (EGD) WITH PROPOFOL N/A 11/28/2015   Procedure: ESOPHAGOGASTRODUODENOSCOPY (EGD) WITH PROPOFOL;  Surgeon: [name], MD;  [facility];  Service: Endoscopy;  Laterality: N/A;  . INSERTION OF DIALYSIS CATHETER Left 07/05/2014   Procedure: INSERTION OF DIALYSIS CATHETER-LEFT INTERNAL JUGULAR PLACEMENT;  Surgeon: Conrad [place], MD;  Location: [place];  Service: Vascular;  Laterality: Left;  . LUMBAR DISC SURGERY    . REMOVAL OF A DIALYSIS CATHETER Right 07/05/2014   Procedure: REMOVAL OF A DIALYSIS CATHETER;  Surgeon: Conrad [place]ora, MD;  Location: [place];  Service: Vascular;  Laterality: Right;    Social  History   Socioeconomic History  . Marital status: Single    Spouse name: Not on file  . Number of children: Not on file  . Years of education: Not on file  . Highest education level: Not on file  Occupational History  . Not on file  Social Needs  . Financial resource strain: Not on file  . Food insecurity:    Worry: Not on file    Inability: Not on file  . Transportation needs:    Medical: Not on file    Non-medical: Not on file  Tobacco Use  . Smoking status: Former Smoker    Packs/day: 0.50    Years: 5.00    Pack years: 2.50    Types: Cigarettes    Last attempt to quit: 08/24/1979    Years since quitting: 38.5  . Smokeless tobacco: Never Used  Substance and Sexual Activity  . Alcohol use: Not Currently    Alcohol/week: 0.0 oz    Comment: 02/08/2018 "stopped in my 50's"  . Drug use: Never  . Sexual activity: Not Currently  Lifestyle  . Physical activity:    Days per week: Not on file    Minutes per session: Not on file  . Stress: Not on file  Relationships  . Social connections:    Talks on phone: Not on file    Gets together: Not on file    Attends religious service: Not  on file    Active member of club or organization: Not on file    Attends meetings of clubs or organizations: Not on file    Relationship status: Not on file  . Intimate partner violence:    Fear of current or ex partner: Not on file    Emotionally abused: Not on file    Physically abused: Not on file    Forced sexual activity: Not on file  Other Topics Concern  . Not on file  Social History Narrative  . Not on file   Family History  Problem Relation Age of Onset  . Diabetes Father   . Heart attack Father       VITAL SIGNS BP 118/70   Pulse 67   Temp 97.6 F (36.4 C)   Resp 20   Ht 5\' 8"  (1.727 m)   Wt 209 lb 14 oz (95.2 kg)   SpO2 96%   BMI 31.91 kg/m   Outpatient Encounter Medications as of 02/15/2018  Medication Sig  . aspirin EC 81 MG tablet Take 81 mg by mouth daily.    Marland Kitchen atorvastatin (LIPITOR) 20 MG tablet Take 20 mg by mouth daily.  . B Complex-C-Folic Acid (NEPHRO VITAMINS) 0.8 MG TABS Take 0.8 mg by mouth. Dialysis Tuesday, Thursday and Saturday  . calcium acetate (PHOSLO) 667 MG capsule Take 667 mg by mouth 3 (three) times daily with meals.  . insulin regular (NOVOLIN R RELION) 100 units/mL injection Inject 0-0.09 mLs (0-9 Units total) into the skin 3 (three) times daily with meals. CBG < 70: implement hypoglycemia protocol CBG 70 - 120: 0 units CBG 121 - 150: 1 unit CBG 151 - 200: 2 units CBG 201 - 250: 3 units CBG 251 - 300: 5 units CBG 301 - 350: 7 units CBG 351 - 400: 9 units CBG > 400: call MD.  . Multiple Vitamin (MULTIVITAMIN) tablet Take 1 tablet by mouth daily.  . Nutritional Supplements (NUTRITIONAL SUPPLEMENT PO) Liberalized renal Diet - Regular texture, Regular / Thin consistency  . pantoprazole (PROTONIX) 20 MG tablet Take 20 mg by mouth daily.  Marland Kitchen PARoxetine (PAXIL) 10 MG tablet Take 10 mg by mouth daily.  . ranitidine (ZANTAC) 150 MG tablet Take 150 mg by mouth daily.   . sevelamer carbonate (RENVELA) 800 MG tablet Take 1 tablet (800 mg total) by mouth 3 (three) times daily with meals.  . [DISCONTINUED] Calcium Acetate, Phos Binder, (CALCIUM ACETATE PO) Take 167 mg by mouth 3 (three) times daily.   . [DISCONTINUED] multivitamin (RENA-VIT) TABS tablet Take 1 tablet by mouth daily.   No facility-administered encounter medications on file as of 02/15/2018.      SIGNIFICANT DIAGNOSTIC EXAMS  PREVIOUS:   02-07-18: chest x-ray: Cardiomegaly. Persistent interstitial opacities could represent recurrent edema versus interstitial lung disease. Recommend clinical Correlation.  02-08-18: ct of head: 1. No acute intracranial pathology seen on CT. 2. Mild cortical volume loss and scattered small vessel ischemic Microangiopathy.  02-08-18: ct of abdomen and pelvis: 1. Bibasilar airspace opacities raise concern for pneumonia, possibly atypical in  nature. Underlying mild fibrotic change noted. 2. Soft tissue inflammation about the bladder could reflect cystitis. 3. Moderate bilateral renal atrophy noted. Scattered bilateral renal cysts, hypodense and hyperdense in nature. 4. Scattered coronary artery calcifications seen. 5. Enlarged prostate noted.  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    Review of Systems  Constitutional: Negative for malaise/fatigue.  Respiratory: Negative for cough and shortness of breath.   Cardiovascular: Negative for chest pain, palpitations and leg swelling.  Gastrointestinal: Negative for abdominal pain, constipation and heartburn.  Musculoskeletal: Negative for back pain, joint pain and myalgias.  Skin: Negative.   Neurological: Negative for dizziness.  Psychiatric/Behavioral: The patient is not nervous/anxious.      Physical Exam  Constitutional: He is oriented to person, place, and time. He appears well-developed and well-nourished. No distress.  Neck: No thyromegaly present.  Cardiovascular: Normal rate, regular rhythm and intact distal pulses.  Murmur heard. 1/6  Pulmonary/Chest: Effort normal and breath sounds normal. No respiratory distress.  Abdominal: Soft. Bowel sounds are normal. He exhibits no distension. There is no tenderness.  Musculoskeletal: Normal range of motion. He exhibits no edema.  Status post right great toe amputation    Lymphadenopathy:    He has no cervical adenopathy.  Neurological: He is alert and oriented to person, place, and  time.  Skin: Skin is warm and dry. He is not diaphoretic.  Right upper extremity A/V fistula + thrill + bruitt      ASSESSMENT/ PLAN:  TODAY:   1. Sepsis due to HCAP 2. 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  Will continue his current plan of care Will conitnue therapy as directed  Time spent with patient 30 minutes: discussed current plan of care; goals and home health needs. Verbalized understanding.   MD is aware of resident's narcotic use and is in agreement with current plan of care. We will attempt to wean resident as apropriate   Ok Edwards NP [facility]  Contact [phone] Monday through Friday 8am- 5pm  After hours call [phone]

## 2018-02-16 ENCOUNTER — Non-Acute Institutional Stay (SKILLED_NURSING_FACILITY): Payer: Medicare Other | Admitting: Internal Medicine

## 2018-02-16 ENCOUNTER — Encounter: Payer: Self-pay | Admitting: Internal Medicine

## 2018-02-16 DIAGNOSIS — E1122 Type 2 diabetes mellitus with diabetic chronic kidney disease: Secondary | ICD-10-CM | POA: Diagnosis not present

## 2018-02-16 DIAGNOSIS — E1169 Type 2 diabetes mellitus with other specified complication: Secondary | ICD-10-CM

## 2018-02-16 DIAGNOSIS — R5381 Other malaise: Secondary | ICD-10-CM | POA: Diagnosis not present

## 2018-02-16 DIAGNOSIS — Z992 Dependence on renal dialysis: Secondary | ICD-10-CM

## 2018-02-16 DIAGNOSIS — D689 Coagulation defect, unspecified: Secondary | ICD-10-CM | POA: Diagnosis not present

## 2018-02-16 DIAGNOSIS — N2581 Secondary hyperparathyroidism of renal origin: Secondary | ICD-10-CM | POA: Diagnosis not present

## 2018-02-16 DIAGNOSIS — F3289 Other specified depressive episodes: Secondary | ICD-10-CM

## 2018-02-16 DIAGNOSIS — N186 End stage renal disease: Secondary | ICD-10-CM

## 2018-02-16 DIAGNOSIS — E785 Hyperlipidemia, unspecified: Secondary | ICD-10-CM

## 2018-02-16 DIAGNOSIS — D631 Anemia in chronic kidney disease: Secondary | ICD-10-CM | POA: Diagnosis not present

## 2018-02-16 DIAGNOSIS — K219 Gastro-esophageal reflux disease without esophagitis: Secondary | ICD-10-CM

## 2018-02-16 DIAGNOSIS — I120 Hypertensive chronic kidney disease with stage 5 chronic kidney disease or end stage renal disease: Secondary | ICD-10-CM

## 2018-02-16 DIAGNOSIS — E1129 Type 2 diabetes mellitus with other diabetic kidney complication: Secondary | ICD-10-CM | POA: Diagnosis not present

## 2018-02-16 NOTE — Progress Notes (Signed)
Patient ID: RAYHAAN HUSTER, male   DOB: 1946-02-17, 72 y.o.   MRN: [id number]  Provider:  DR Arletha Grippe Location:  [address] Room Number: 118 A Place of Service:  SNF (31)  PCP: [name], MD Patient Care Team: [name], MD as PCP - General (Internal Medicine) Corliss Parish, MD as Consulting Physician (Nephrology) Jacelyn Pi, MD as Consulting Physician (Endocrinology)  Extended Emergency Contact Information Primary Emergency Contact: Grandville Silos [place] Phone: [phone] Relation: Niece Secondary Emergency Contact: Ignacia Palma [place] of [place] Phone: [phone] Relation: Sister  Code Status: Full Code Goals of Care: Advanced Directive information Advanced Directives 02/16/2018  Does Patient Have a Medical Advance Directive? No  Would patient like information on creating a medical advance directive? No - Patient declined  Pre-existing out of facility DNR order (yellow form or pink MOST form) -      Chief Complaint  Patient presents with  . New Admit To SNF    Admission    HPI: Patient is a 72 y.o. male seen today for admission to SNF following hospital stay for sepsis, metabolic encephalopathy, electrolyte disturbance, acute respiratory failure with hypoxia, ESRD/HD TThSa, depression, N/V, DM, anemia. He was tx for suspected bacteremia/sepsis with IV vanco/zosyn >>zosyn alone. procalcitonin was elevated at 51.6. He was placed on [place] O2. CT head neg for acute process. 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 He presents to SNF for short term rehab.  Today he reports no concerns. He makes intermittent urine. CBGs 260-270s; occas <200. No low BS reactions. No nursing issues. No falls. Appetite reduced. Sleeps well.  Hyperlipidemia - takes lipitor 20 mg daily. No recent LDL in EPIC  ESRD - on HD TThSa via right  arm AVF; followed by nephrology. He takes calcium acetate 667 mg three times daily and renvela 800 mg three times daily   DM - CBGs elevated at SNF. He is currently on Novolin R SSI (for CBG 121-150: 1 unit; 151-200: 2 units; 201-250: 3 units; 251-300: 5 units: 301-350: 7 units; 351-400: 9 units>; he takes ASA 81 mg daily. He has ESRD and is on HD. He takes statin. No recent a1c in EPIC  GERD - stable on zantac150 mg daily and protonix 20 mg daily   Depression - mood stable on paxil 10 mg daily   HTN - diet controlled  Past Medical History:  Diagnosis Date  . Acute osteomyelitis of toe of right foot ([place]) 08/02/2015  . Arthritis    "maybe RUE/hand" (02/08/2018)  . ESRD (end stage renal disease) on dialysis [facility])    "TTS; [place]" (02/08/2018)  . Hyperlipidemia   . Hypertension   . Type II diabetes mellitus ([place])    Past Surgical History:  Procedure Laterality Date  . AMPUTATION TOE Right 08/04/2015   Procedure: RIGHT GREAT TOE AMPUTATION;  Surgeon: [name], MD;  Location: [facility] [place];  Service: Orthopedics;  Laterality: Right;  . AV FISTULA PLACEMENT Right 07/05/2014   Procedure: ARTERIOVENOUS BRACHIALCEPHALIC (AV) FISTULA CREATION;  Surgeon: Conrad [place], MD;  Location: [place];  Service: Vascular;  Laterality: Right;  . BACK SURGERY    . ESOPHAGOGASTRODUODENOSCOPY (EGD) WITH PROPOFOL N/A 11/28/2015   Procedure: ESOPHAGOGASTRODUODENOSCOPY (EGD) WITH PROPOFOL;  Surgeon: [name], MD;  Location: [facility];  Service: Endoscopy;  Laterality: N/A;  . INSERTION OF DIALYSIS CATHETER Left  07/05/2014   Procedure: INSERTION OF DIALYSIS CATHETER-LEFT INTERNAL JUGULAR PLACEMENT;  Surgeon: Conrad Hebron, MD;  Location: [facility];  Service: Vascular;  Laterality: Left;  . LUMBAR DISC SURGERY    . REMOVAL OF A DIALYSIS CATHETER Right 07/05/2014   Procedure: REMOVAL OF A DIALYSIS CATHETER;  Surgeon: Conrad [place], MD;  Location: [place];  Service: Vascular;  Laterality: Right;    reports that he  quit smoking about 38 years ago. His smoking use included cigarettes. He has a 2.50 pack-year smoking history. He has never used smokeless tobacco. He reports that he drank alcohol. He reports that he does not use drugs. Social History   Socioeconomic History  . Marital status: Single    Spouse name: Not on file  . Number of children: Not on file  . Years of education: Not on file  . Highest education level: Not on file  Occupational History  . Not on file  Social Needs  . Financial resource strain: Not on file  . Food insecurity:    Worry: Not on file    Inability: Not on file  . Transportation needs:    Medical: Not on file    Non-medical: Not on file  Tobacco Use  . Smoking status: Former Smoker    Packs/day: 0.50    Years: 5.00    Pack years: 2.50    Types: Cigarettes    Last attempt to quit: 08/24/1979    Years since quitting: 38.5  . Smokeless tobacco: Never Used  Substance and Sexual Activity  . Alcohol use: Not Currently    Alcohol/week: 0.0 oz    Comment: 02/08/2018 "stopped in my 50's"  . Drug use: Never  . Sexual activity: Not Currently  Lifestyle  . Physical activity:    Days per week: Not on file    Minutes per session: Not on file  . Stress: Not on file  Relationships  . Social connections:    Talks on phone: Not on file    Gets together: Not on file    Attends religious service: Not on file    Active member of club or organization: Not on file    Attends meetings of clubs or organizations: Not on file    Relationship status: Not on file  . Intimate partner violence:    Fear of current or ex partner: Not on file    Emotionally abused: Not on file    Physically abused: Not on file    Forced sexual activity: Not on file  Other Topics Concern  . Not on file  Social History Narrative  . Not on file    Functional Status Survey:    Family History  Problem Relation Age of Onset  . Diabetes Father   . Heart attack Father     Health Maintenance    Topic Date Due  . HEMOGLOBIN A1C  03/16/2018 (Originally 01/01/2015)  . FOOT EXAM  02/15/2019 (Originally 08/23/1956)  . OPHTHALMOLOGY EXAM  02/15/2019 (Originally 08/23/1956)  . URINE MICROALBUMIN  02/15/2019 (Originally 08/23/1956)  . COLONOSCOPY  02/15/2019 (Originally 08/23/1997)  . Hepatitis C Screening  02/15/2019 (Originally [birth year])  . INFLUENZA VACCINE  04/13/2018  . TETANUS/TDAP  Discontinued  . PNA vac Low Risk Adult  Discontinued    No Known Allergies  Outpatient Encounter Medications as of 02/16/2018  Medication Sig  . aspirin EC 81 MG tablet Take 81 mg by mouth daily.  Marland Kitchen atorvastatin (LIPITOR) 20 MG tablet Take 20 mg by mouth  daily.  . B Complex-C-Folic Acid (NEPHRO VITAMINS) 0.8 MG TABS Take 0.8 mg by mouth. Dialysis Tuesday, Thursday and Saturday  . calcium acetate (PHOSLO) 667 MG capsule Take 667 mg by mouth 3 (three) times daily with meals.  . insulin regular (NOVOLIN R RELION) 100 units/mL injection Inject 0-0.09 mLs (0-9 Units total) into the skin 3 (three) times daily with meals. CBG < 70: implement hypoglycemia protocol CBG 70 - 120: 0 units CBG 121 - 150: 1 unit CBG 151 - 200: 2 units CBG 201 - 250: 3 units CBG 251 - 300: 5 units CBG 301 - 350: 7 units CBG 351 - 400: 9 units CBG > 400: call MD.  . Multiple Vitamin (MULTIVITAMIN) tablet Take 1 tablet by mouth daily.  . Nutritional Supplements (NUTRITIONAL SUPPLEMENT PO) Liberalized renal Diet - Regular texture, Regular / Thin consistency  . pantoprazole (PROTONIX) 20 MG tablet Take 20 mg by mouth daily.  Marland Kitchen PARoxetine (PAXIL) 10 MG tablet Take 10 mg by mouth daily.  . ranitidine (ZANTAC) 150 MG tablet Take 150 mg by mouth daily.   . sevelamer carbonate (RENVELA) 800 MG tablet Take 1 tablet (800 mg total) by mouth 3 (three) times daily with meals.   No facility-administered encounter medications on file as of 02/16/2018.     Review of Systems  Constitutional: Positive for appetite change.  All other  systems reviewed and are negative.   Vitals:   02/16/18 0847  BP: 118/70  Pulse: 67  Resp: 20  Temp: 97.6 F (36.4 C)  SpO2: 96%  Weight: 208 lb 3.2 oz (94.4 kg)  Height: 5\' 8"  (1.727 m)   Body mass index is 31.66 kg/m. Physical Exam  Constitutional: He is oriented to person, place, and time. He appears well-developed and well-nourished.  Looks well in NAD, sitting up in bed  HENT:  Mouth/Throat: Oropharynx is clear and moist.  MMM; no oral thrush  Eyes: Pupils are equal, round, and reactive to light. No scleral icterus.  Neck: Neck supple. Carotid bruit is present (b/l ). No thyromegaly present.  Cardiovascular: Normal rate, regular rhythm and intact distal pulses. Exam reveals no gallop and no friction rub.  Murmur heard.  Systolic (-->carotid b/l) murmur is present. Right arm AVF with palpable thrill/audible bruit; trace LE edema b/l; no calf TTP  Pulmonary/Chest: Effort normal and breath sounds normal. He has no wheezes. He has no rales. He exhibits no tenderness.  Abdominal: Soft. Bowel sounds are normal. He exhibits no distension, no abdominal bruit, no pulsatile midline mass and no mass. There is no hepatomegaly. There is no tenderness. There is no rebound and no guarding.  obese  Lymphadenopathy:    He has no cervical adenopathy.  Neurological: He is alert and oriented to person, place, and time. He has normal reflexes.  Skin: Skin is warm and dry. No rash noted.  Psychiatric: He has a normal mood and affect. His behavior is normal. Judgment and thought content normal.    Labs reviewed: Basic Metabolic Panel: Recent Labs    02/07/18 2345  02/08/18 0255 02/09/18 0642 02/11/18 0739  NA  --    < > 139 139 140  K  --    < > 4.4 4.6 4.1  CL  --    < > 102 104 102  CO2  --    < > 23 23 24   GLUCOSE  --    < > 190* 97 165*  BUN  --    < >  40* 62* 45*  CREATININE  --    < > 7.25* 9.42* 8.24*  CALCIUM  --    < > 9.0 8.4* 8.6*  MG 1.0*  --  1.6* 1.8  --   PHOS  --    --   --  4.4 5.8*   < > = values in this interval not displayed.   Liver Function Tests: Recent Labs    02/07/18 2145 02/08/18 0022 02/09/18 0642 02/11/18 0739  AST 18 25  --   --   ALT 13* 17  --   --   ALKPHOS 52 70  --   --   BILITOT 0.6 1.2  --   --   PROT 4.2* 6.0*  --   --   ALBUMIN 2.0* 2.7* 2.7* 2.6*   Recent Labs    02/07/18 2145  LIPASE 25   Recent Labs    02/07/18 2150  AMMONIA 9   CBC: Recent Labs    02/07/18 2145 02/08/18 0022 02/08/18 0255 02/09/18 0642 02/11/18 0739  WBC 7.2 10.8* 13.0* 13.6* 7.0  NEUTROABS 6.1 8.7*  --   --   --   HGB 8.2* 9.9* 10.8* 10.0* 10.9*  HCT 26.5* 32.1* 35.1* 31.4* 34.1*  MCV 94.6 93.9 93.4 91.0 89.7  PLT 108* 136* 131* 134* 157   Cardiac Enzymes: Recent Labs    02/08/18 0255  TROPONINI <0.03   BNP: Invalid input(s): POCBNP Lab Results  Component Value Date   HGBA1C 6.0 (H) 07/02/2014   Lab Results  Component Value Date   TSH 0.706 02/07/2018   Lab Results  Component Value Date   VITAMINB12 658 02/08/2018   Lab Results  Component Value Date   FOLATE 13.6 02/08/2018   Lab Results  Component Value Date   IRON 10 (L) 02/08/2018   TIBC 144 (L) 02/08/2018   FERRITIN 1,090 (H) 02/08/2018    Imaging and Procedures obtained prior to SNF admission: Ct Abdomen Pelvis Wo Contrast  Result Date: 02/08/2018 CLINICAL DATA:  Acute onset of generalized weakness, nausea and vomiting. EXAM: CT ABDOMEN AND PELVIS WITHOUT CONTRAST TECHNIQUE: Multidetector CT imaging of the abdomen and pelvis was performed following the standard protocol without IV contrast. COMPARISON:  Right upper quadrant ultrasound performed 04/21/2016 FINDINGS: Lower chest: Bibasilar airspace opacities raise concern for pneumonia, possibly atypical in nature. Underlying mild fibrotic change is noted. The visualized portions of the mediastinum are grossly unremarkable. Scattered coronary artery calcifications are seen. Hepatobiliary: The liver is  unremarkable in appearance. The gallbladder is unremarkable in appearance. The common bile duct remains normal in caliber. Pancreas: The pancreas is within normal limits. Spleen: The spleen is unremarkable in appearance. Adrenals/Urinary Tract: The adrenal glands are unremarkable in appearance. Moderate bilateral renal atrophy is noted. Scattered bilateral renal cysts are seen, hypodense and hyperdense in nature. Nonspecific perinephric stranding is noted bilaterally. There is no evidence of hydronephrosis. No renal or ureteral stones are identified Stomach/Bowel: The stomach is unremarkable in appearance. The small bowel is within normal limits. The appendix is normal in caliber, without evidence of appendicitis. The colon is unremarkable in appearance. Vascular/Lymphatic: The abdominal aorta is unremarkable in appearance. The inferior vena cava is grossly unremarkable. No retroperitoneal lymphadenopathy is seen. No pelvic sidewall lymphadenopathy is identified. Reproductive: The bladder is decompressed. Soft tissue inflammation about the bladder could reflect cystitis. The prostate is enlarged, measuring 5.2 cm in transverse dimension. Other: No additional soft tissue abnormalities are seen. Musculoskeletal: No acute osseous abnormalities are identified. Vacuum  phenomenon is noted at L5-S1. The visualized musculature is unremarkable in appearance. IMPRESSION: 1. Bibasilar airspace opacities raise concern for pneumonia, possibly atypical in nature. Underlying mild fibrotic change noted. 2. Soft tissue inflammation about the bladder could reflect cystitis. 3. Moderate bilateral renal atrophy noted. Scattered bilateral renal cysts, hypodense and hyperdense in nature. 4. Scattered coronary artery calcifications seen. 5. Enlarged prostate noted. Electronically Signed   By: Garald Balding M.D.   On: 02/08/2018 01:24   Ct Head Wo Contrast  Result Date: 02/08/2018 CLINICAL DATA:  Acute onset of generalized weakness  and lethargy. EXAM: CT HEAD WITHOUT CONTRAST TECHNIQUE: Contiguous axial images were obtained from the base of the skull through the vertex without intravenous contrast. COMPARISON:  CT of the head performed 08/01/2015 FINDINGS: Brain: No evidence of acute infarction, hemorrhage, hydrocephalus, extra-axial collection or mass lesion / mass effect. Prominence of the ventricles and sulci reflects mild cortical volume loss. Scattered periventricular and subcortical white matter change likely reflects small vessel ischemic microangiopathy. The brainstem and fourth ventricle are within normal limits. The basal ganglia are unremarkable in appearance. The cerebral hemispheres demonstrate grossly normal gray-white differentiation. No mass effect or midline shift is seen. Vascular: No hyperdense vessel or unexpected calcification. Skull: There is no evidence of fracture; visualized osseous structures are unremarkable in appearance. Sinuses/Orbits: The orbits are within normal limits. The paranasal sinuses and mastoid air cells are well-aerated. Other: No significant soft tissue abnormalities are seen. IMPRESSION: 1. No acute intracranial pathology seen on CT. 2. Mild cortical volume loss and scattered small vessel ischemic microangiopathy. Electronically Signed   By: Garald Balding M.D.   On: 02/08/2018 01:17   Dg Chest Portable 1 View  Result Date: 02/07/2018 CLINICAL DATA:  Weakness.  Vomiting. EXAM: PORTABLE CHEST 1 VIEW COMPARISON:  March 02, 2017 FINDINGS: Stable cardiomegaly. The hila and mediastinum are unchanged. No pneumothorax. Diffuse interstitial opacities in the lungs are similar to the previous study. No nodule or mass. No focal infiltrate. IMPRESSION: Cardiomegaly. Persistent interstitial opacities could represent recurrent edema versus interstitial lung disease. Recommend clinical correlation. Electronically Signed   By: Dorise Bullion III M.D   On: 02/07/2018 22:14    Assessment/Plan   ICD-10-CM   1.  Type 2 diabetes mellitus with hypertension and end stage renal disease on dialysis ([place]) E11.22    I12.0    Z99.2    N18.6   2. Physical deconditioning R53.81   3. 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 ([name]) E11.22    N18.6    Z99.2   4. Dyslipidemia associated with type 2 diabetes mellitus ([place]) E11.69    E78.5   5. GERD without esophagitis K21.9   6. Other depression F32.89     prevnar vaccine given today  Cont current meds as ordered  PT/OT/ST as ordered  F/u with HD as scheduled  F/u with specialists as scheduled  GOAL: short term rehab and d/c home when medically appropriate. Communicated with pt and nursing.  Will follow  Labs/tests ordered: a1c    Makayia Duplessis S. Perlie Gold  [facility] [address] [place], [place] [address] [phone] Cell (Monday-Friday 8 AM - 5 PM) [phone] After 5 PM and follow prompts

## 2018-02-17 LAB — HEMOGLOBIN A1C: Hemoglobin A1C: 7.6

## 2018-02-18 DIAGNOSIS — E1129 Type 2 diabetes mellitus with other diabetic kidney complication: Secondary | ICD-10-CM | POA: Diagnosis not present

## 2018-02-18 DIAGNOSIS — D689 Coagulation defect, unspecified: Secondary | ICD-10-CM | POA: Diagnosis not present

## 2018-02-18 DIAGNOSIS — D631 Anemia in chronic kidney disease: Secondary | ICD-10-CM | POA: Diagnosis not present

## 2018-02-18 DIAGNOSIS — N2581 Secondary hyperparathyroidism of renal origin: Secondary | ICD-10-CM | POA: Diagnosis not present

## 2018-02-18 DIAGNOSIS — N186 End stage renal disease: Secondary | ICD-10-CM | POA: Diagnosis not present

## 2018-02-21 DIAGNOSIS — D631 Anemia in chronic kidney disease: Secondary | ICD-10-CM | POA: Diagnosis not present

## 2018-02-21 DIAGNOSIS — E1129 Type 2 diabetes mellitus with other diabetic kidney complication: Secondary | ICD-10-CM | POA: Diagnosis not present

## 2018-02-21 DIAGNOSIS — N2581 Secondary hyperparathyroidism of renal origin: Secondary | ICD-10-CM | POA: Diagnosis not present

## 2018-02-21 DIAGNOSIS — N186 End stage renal disease: Secondary | ICD-10-CM | POA: Diagnosis not present

## 2018-02-21 DIAGNOSIS — D689 Coagulation defect, unspecified: Secondary | ICD-10-CM | POA: Diagnosis not present

## 2018-02-22 ENCOUNTER — Encounter: Payer: Self-pay | Admitting: Adult Health

## 2018-02-22 ENCOUNTER — Non-Acute Institutional Stay (SKILLED_NURSING_FACILITY): Payer: Medicare Other | Admitting: Adult Health

## 2018-02-22 DIAGNOSIS — E785 Hyperlipidemia, unspecified: Secondary | ICD-10-CM

## 2018-02-22 DIAGNOSIS — N186 End stage renal disease: Secondary | ICD-10-CM

## 2018-02-22 DIAGNOSIS — I120 Hypertensive chronic kidney disease with stage 5 chronic kidney disease or end stage renal disease: Secondary | ICD-10-CM | POA: Diagnosis not present

## 2018-02-22 DIAGNOSIS — E1122 Type 2 diabetes mellitus with diabetic chronic kidney disease: Secondary | ICD-10-CM

## 2018-02-22 DIAGNOSIS — Z992 Dependence on renal dialysis: Secondary | ICD-10-CM | POA: Diagnosis not present

## 2018-02-22 DIAGNOSIS — E1169 Type 2 diabetes mellitus with other specified complication: Secondary | ICD-10-CM | POA: Diagnosis not present

## 2018-02-22 NOTE — Progress Notes (Signed)
Location:   [facility] Room Number: 118 A Place of Service:  SNF (31)   CODE STATUS: Full Code  No Known Allergies  Chief Complaint  Patient presents with  . Medical Management of Chronic Issues    Dyslipidemia; esrd; diabetes. Weekly follow up for the first 30 days post hospitalization     HPI:  He is a 72 year old short term resident of this facility being seen for the management of his chronic illnesses: dyslipidemia; esrd; diabetes. He denies any cough or shortness of breath; or uncontrolled pain. There are no nursing concerns at this time.   Past Medical History:  Diagnosis Date  . Acute osteomyelitis of toe of right foot ([place]) 08/02/2015  . Arthritis    "maybe RUE/hand" (02/08/2018)  . ESRD (end stage renal disease) on dialysis [facility])    "TTS; [place]" (02/08/2018)  . Hyperlipidemia   . Hypertension   . Type II diabetes mellitus ([place])     Past Surgical History:  Procedure Laterality Date  . AMPUTATION TOE Right 08/04/2015   Procedure: RIGHT GREAT TOE AMPUTATION;  Surgeon: [name], MD;  Location: [place];  Service: Orthopedics;  Laterality: Right;  . AV FISTULA PLACEMENT Right 07/05/2014   Procedure: ARTERIOVENOUS BRACHIALCEPHALIC (AV) FISTULA CREATION;  Surgeon: Conrad Ham Lake, MD;  Location: [place];  Service: Vascular;  Laterality: Right;  . BACK SURGERY    . ESOPHAGOGASTRODUODENOSCOPY (EGD) WITH PROPOFOL N/A 11/28/2015   Procedure: ESOPHAGOGASTRODUODENOSCOPY (EGD) WITH PROPOFOL;  Surgeon: [name], MD;  Location: [facility];  Service: Endoscopy;  Laterality: N/A;  . INSERTION OF DIALYSIS CATHETER Left 07/05/2014   Procedure: INSERTION OF DIALYSIS CATHETER-LEFT INTERNAL JUGULAR PLACEMENT;  Surgeon: Conrad Decatur, MD;  Location: [place];  Service: Vascular;  Laterality: Left;  . LUMBAR DISC SURGERY    . REMOVAL OF A DIALYSIS CATHETER Right 07/05/2014   Procedure: REMOVAL OF A DIALYSIS CATHETER;  Surgeon: Conrad Wagram, MD;  Location: [place];   Service: Vascular;  Laterality: Right;    Social History   Socioeconomic History  . Marital status: Single    Spouse name: Not on file  . Number of children: Not on file  . Years of education: Not on file  . Highest education level: Not on file  Occupational History  . Not on file  Social Needs  . Financial resource strain: Not on file  . Food insecurity:    Worry: Not on file    Inability: Not on file  . Transportation needs:    Medical: Not on file    Non-medical: Not on file  Tobacco Use  . Smoking status: Former Smoker    Packs/day: 0.50    Years: 5.00    Pack years: 2.50    Types: Cigarettes    Last attempt to quit: 08/24/1979    Years since quitting: 38.5  . Smokeless tobacco: Never Used  Substance and Sexual Activity  . Alcohol use: Not Currently    Alcohol/week: 0.0 oz    Comment: 02/08/2018 "stopped in my 50's"  . Drug use: Never  . Sexual activity: Not Currently  Lifestyle  . Physical activity:    Days per week: Not on file    Minutes per session: Not on file  . Stress: Not on file  Relationships  . Social connections:    Talks on phone: Not on file    Gets together: Not on file    Attends religious service: Not on file  Active member of club or organization: Not on file    Attends meetings of clubs or organizations: Not on file    Relationship status: Not on file  . Intimate partner violence:    Fear of current or ex partner: Not on file    Emotionally abused: Not on file    Physically abused: Not on file    Forced sexual activity: Not on file  Other Topics Concern  . Not on file  Social History Narrative  . Not on file   Family History  Problem Relation Age of Onset  . Diabetes Father   . Heart attack Father       VITAL SIGNS BP 126/74   Pulse 72   Temp 98 F (36.7 C)   Resp 18   Ht 5\' 8"  (1.727 m)   Wt 208 lb 3.2 oz (94.4 kg)   SpO2 97%   BMI 31.66 kg/m   Outpatient Encounter Medications as of 02/22/2018  Medication Sig  .  aspirin EC 81 MG tablet Take 81 mg by mouth daily.  Marland Kitchen atorvastatin (LIPITOR) 20 MG tablet Take 20 mg by mouth daily.  . B Complex-C-Folic Acid (NEPHRO VITAMINS) 0.8 MG TABS Take 0.8 mg by mouth. Dialysis Tuesday, Thursday and Saturday  . calcium acetate (PHOSLO) 667 MG capsule Take 667 mg by mouth 3 (three) times daily with meals.  . insulin regular (NOVOLIN R RELION) 100 units/mL injection Inject 0-0.09 mLs (0-9 Units total) into the skin 3 (three) times daily with meals. CBG < 70: implement hypoglycemia protocol CBG 70 - 120: 0 units CBG 121 - 150: 1 unit CBG 151 - 200: 2 units CBG 201 - 250: 3 units CBG 251 - 300: 5 units CBG 301 - 350: 7 units CBG 351 - 400: 9 units CBG > 400: call MD.  . Multiple Vitamin (MULTIVITAMIN) tablet Take 1 tablet by mouth daily.  . Nutritional Supplements (NUTRITIONAL SUPPLEMENT PO) Liberalized renal Diet - Regular texture, Regular / Thin consistency  . pantoprazole (PROTONIX) 20 MG tablet Take 20 mg by mouth daily.  Marland Kitchen PARoxetine (PAXIL) 10 MG tablet Take 10 mg by mouth daily.  . ranitidine (ZANTAC) 150 MG tablet Take 150 mg by mouth daily.   . sevelamer carbonate (RENVELA) 800 MG tablet Take 1 tablet (800 mg total) by mouth 3 (three) times daily with meals.   No facility-administered encounter medications on file as of 02/22/2018.      SIGNIFICANT DIAGNOSTIC EXAMS   PREVIOUS:   02-07-18: chest x-ray: Cardiomegaly. Persistent interstitial opacities could represent recurrent edema versus interstitial lung disease. Recommend clinical Correlation.  02-08-18: ct of head: 1. No acute intracranial pathology seen on CT. 2. Mild cortical volume loss and scattered small vessel ischemic Microangiopathy.  02-08-18: ct of abdomen and pelvis: 1. Bibasilar airspace opacities raise concern for pneumonia, possibly atypical in nature. Underlying mild fibrotic change noted. 2. Soft tissue inflammation about the bladder could reflect cystitis. 3. Moderate bilateral renal  atrophy noted. Scattered bilateral renal cysts, hypodense and hyperdense in nature. 4. Scattered coronary artery calcifications seen. 5. Enlarged prostate noted.  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   Review of Systems  Constitutional: Negative for malaise/fatigue.  Respiratory: Negative for cough and shortness of breath.   Cardiovascular: Negative for chest pain, palpitations and leg swelling.  Gastrointestinal: Negative for abdominal pain, constipation and heartburn.  Musculoskeletal: Negative for back pain, joint pain and myalgias.  Skin: Negative.   Neurological: Negative for dizziness.  Psychiatric/Behavioral: The patient is not nervous/anxious.     Physical Exam  Constitutional: He is oriented to person, place, and time. He appears well-developed and well-nourished. No distress.  Neck: No thyromegaly present.  Cardiovascular: Normal rate, regular rhythm and intact distal pulses.  Murmur heard. 1/6  Pulmonary/Chest: Effort normal and breath sounds normal. No respiratory distress.  Requires 02   Abdominal: Soft. Bowel sounds are normal. He exhibits no distension. There is no tenderness.  Musculoskeletal: Normal range of motion. He exhibits no edema.  Status post right great toe amputation     Lymphadenopathy:    He has no cervical adenopathy.  Neurological: He is alert and oriented to person, place, and time.  Skin: Skin is warm and dry. He is not diaphoretic.  Right upper extremity A/V fistula + thrill + bruitt     Psychiatric: He  has a normal mood and affect.     ASSESSMENT/ PLAN:  TODAY:   1. Dyslipidemia associated with type 2 diabetes mellitus: stable will continue lipitor 20 mg daily   2. End stage renal disease on dialysis due to type diabetes mellitus: stable; is followed by nephrology; is on hemodialysis three times weekly; will continue calcium acetate 667 mg three times daily and renvela 800 mg three times daily   3. Type 2 diabetes mellitus with hypertension and end stage renal disease on dialysis: is stable will continue Novolin R SSI: 121-150: 1 unit; 151-200: 2 units; 201-250: 3 units; 251-300: 5 units: 301-350: 7 units; 351-400: 9 units; asa 81 mg daily   PREVIOUS  4. GERD without esophagitis: stable will continue zantac 150 mg daily and protonix 20 mg daily   5. Depression: stable will continue paxil 10 mg daily   6. HCAP: stable  will continue 02 at 2L/[place] and will wean as tolerated.    MD is aware of resident's narcotic use and is in agreement with current plan of care. We will attempt to wean resident as apropriate   Ok Edwards NP [facility]  Contact [phone] Monday through Friday 8am- 5pm  After hours call [phone]

## 2018-02-23 DIAGNOSIS — E1129 Type 2 diabetes mellitus with other diabetic kidney complication: Secondary | ICD-10-CM | POA: Diagnosis not present

## 2018-02-23 DIAGNOSIS — D631 Anemia in chronic kidney disease: Secondary | ICD-10-CM | POA: Diagnosis not present

## 2018-02-23 DIAGNOSIS — N186 End stage renal disease: Secondary | ICD-10-CM | POA: Diagnosis not present

## 2018-02-23 DIAGNOSIS — N2581 Secondary hyperparathyroidism of renal origin: Secondary | ICD-10-CM | POA: Diagnosis not present

## 2018-02-23 DIAGNOSIS — D689 Coagulation defect, unspecified: Secondary | ICD-10-CM | POA: Diagnosis not present

## 2018-02-24 ENCOUNTER — Encounter: Payer: Self-pay | Admitting: Adult Health

## 2018-02-24 ENCOUNTER — Non-Acute Institutional Stay (SKILLED_NURSING_FACILITY): Payer: Medicare Other | Admitting: Adult Health

## 2018-02-24 DIAGNOSIS — N186 End stage renal disease: Secondary | ICD-10-CM

## 2018-02-24 DIAGNOSIS — A419 Sepsis, unspecified organism: Secondary | ICD-10-CM

## 2018-02-24 DIAGNOSIS — E1122 Type 2 diabetes mellitus with diabetic chronic kidney disease: Secondary | ICD-10-CM

## 2018-02-24 DIAGNOSIS — J9611 Chronic respiratory failure with hypoxia: Secondary | ICD-10-CM

## 2018-02-24 DIAGNOSIS — Z992 Dependence on renal dialysis: Secondary | ICD-10-CM | POA: Diagnosis not present

## 2018-02-24 NOTE — Progress Notes (Signed)
Location:   [facility] Room Number: 118 A Place of Service:  SNF (31)    CODE STATUS: Full Code  No Known Allergies  Chief Complaint  Patient presents with  . Discharge Note    Discharging    HPI:  He is being discharged to home with home health for pt/ot/rn/cna. He will need a front wheel walker and home 02. He requires 02 due to chronic respiratory failure with acute respiratory failure and pneumonia. Saturation test conducted and his 25 sate were at 85% during exertion on room air and 92 % with 02 at 2 L/[place]. He will need his prescriptions written and will need to follow up with his medical provider.  He had been hospitalized for sepsis due to pneumonia. He was admitted to this facility for short term rehab and is now ready for discharge to home.     Past Medical History:  Diagnosis Date  . Acute osteomyelitis of toe of right foot ([place]) 08/02/2015  . Arthritis    "maybe RUE/hand" (02/08/2018)  . ESRD (end stage renal disease) on dialysis [facility])    "TTS; [place]" (02/08/2018)  . Hyperlipidemia   . Hypertension   . Type II diabetes mellitus ([place])     Past Surgical History:  Procedure Laterality Date  . AMPUTATION TOE Right 08/04/2015   Procedure: RIGHT GREAT TOE AMPUTATION;  Surgeon: [name], MD;  Location: [place];  Service: Orthopedics;  Laterality: Right;  . AV FISTULA PLACEMENT Right 07/05/2014   Procedure: ARTERIOVENOUS BRACHIALCEPHALIC (AV) FISTULA CREATION;  Surgeon: Conrad Lake Lorraine, MD;  Location: [place];  Service: Vascular;  Laterality: Right;  . BACK SURGERY    . ESOPHAGOGASTRODUODENOSCOPY (EGD) WITH PROPOFOL N/A 11/28/2015   Procedure: ESOPHAGOGASTRODUODENOSCOPY (EGD) WITH PROPOFOL;  Surgeon: [name], MD;  [facility];  Service: Endoscopy;  Laterality: N/A;  . INSERTION OF DIALYSIS CATHETER Left 07/05/2014   Procedure: INSERTION OF DIALYSIS CATHETER-LEFT INTERNAL JUGULAR PLACEMENT;  Surgeon: Conrad Fergus, MD;  Location: [place];   Service: Vascular;  Laterality: Left;  . LUMBAR DISC SURGERY    . REMOVAL OF A DIALYSIS CATHETER Right 07/05/2014   Procedure: REMOVAL OF A DIALYSIS CATHETER;  Surgeon: Conrad Henning, MD;  Location: [place];  Service: Vascular;  Laterality: Right;    Social History   Socioeconomic History  . Marital status: Single    Spouse name: Not on file  . Number of children: Not on file  . Years of education: Not on file  . Highest education level: Not on file  Occupational History  . Not on file  Social Needs  . Financial resource strain: Not on file  . Food insecurity:    Worry: Not on file    Inability: Not on file  . Transportation needs:    Medical: Not on file    Non-medical: Not on file  Tobacco Use  . Smoking status: Former Smoker    Packs/day: 0.50    Years: 5.00    Pack years: 2.50    Types: Cigarettes    Last attempt to quit: 08/24/1979    Years since quitting: 38.5  . Smokeless tobacco: Never Used  Substance and Sexual Activity  . Alcohol use: Not Currently    Alcohol/week: 0.0 oz    Comment: 02/08/2018 "stopped in my 50's"  . Drug use: Never  . Sexual activity: Not Currently  Lifestyle  . Physical activity:    Days per week: Not on file  Minutes per session: Not on file  . Stress: Not on file  Relationships  . Social connections:    Talks on phone: Not on file    Gets together: Not on file    Attends religious service: Not on file    Active member of club or organization: Not on file    Attends meetings of clubs or organizations: Not on file    Relationship status: Not on file  . Intimate partner violence:    Fear of current or ex partner: Not on file    Emotionally abused: Not on file    Physically abused: Not on file    Forced sexual activity: Not on file  Other Topics Concern  . Not on file  Social History Narrative  . Not on file   Family History  Problem Relation Age of Onset  . Diabetes Father   . Heart attack Father     VITAL SIGNS BP  132/63   Pulse 69   Temp (!) 96.2 F (35.7 C)   Resp (!) 22   Ht 5\' 8"  (1.727 m)   Wt 208 lb 3.2 oz (94.4 kg)   SpO2 97%   BMI 31.66 kg/m   Patient's Medications  New Prescriptions   No medications on file  Previous Medications   ASPIRIN EC 81 MG TABLET    Take 81 mg by mouth daily.   ATORVASTATIN (LIPITOR) 20 MG TABLET    Take 20 mg by mouth daily.   B COMPLEX-C-FOLIC ACID (NEPHRO VITAMINS) 0.8 MG TABS    Take 0.8 mg by mouth. Dialysis Tuesday, Thursday and Saturday   CALCIUM ACETATE (PHOSLO) 667 MG CAPSULE    Take 667 mg by mouth 3 (three) times daily with meals.   INSULIN REGULAR (NOVOLIN R RELION) 100 UNITS/ML INJECTION    Inject 0-0.09 mLs (0-9 Units total) into the skin 3 (three) times daily with meals. CBG < 70: implement hypoglycemia protocol CBG 70 - 120: 0 units CBG 121 - 150: 1 unit CBG 151 - 200: 2 units CBG 201 - 250: 3 units CBG 251 - 300: 5 units CBG 301 - 350: 7 units CBG 351 - 400: 9 units CBG > 400: call MD.   MULTIPLE VITAMIN (MULTIVITAMIN) TABLET    Take 1 tablet by mouth daily.   NUTRITIONAL SUPPLEMENTS (NUTRITIONAL SUPPLEMENT PO)    Liberalized renal Diet - Regular texture, Regular / Thin consistency   PANTOPRAZOLE (PROTONIX) 20 MG TABLET    Take 20 mg by mouth daily.   PAROXETINE (PAXIL) 10 MG TABLET    Take 10 mg by mouth daily.   RANITIDINE (ZANTAC) 150 MG TABLET    Take 150 mg by mouth daily.    SEVELAMER CARBONATE (RENVELA) 800 MG TABLET    Take 1 tablet (800 mg total) by mouth 3 (three) times daily with meals.  Modified Medications   No medications on file  Discontinued Medications   No medications on file     SIGNIFICANT DIAGNOSTIC EXAMS  PREVIOUS:   02-07-18: chest x-ray: Cardiomegaly. Persistent interstitial opacities could represent recurrent edema versus interstitial lung disease. Recommend clinical Correlation.  02-08-18: ct of head: 1. No acute intracranial pathology seen on CT. 2. Mild cortical volume loss and scattered small vessel  ischemic Microangiopathy.  02-08-18: ct of abdomen and pelvis: 1. Bibasilar airspace opacities raise concern for pneumonia, possibly atypical in nature. Underlying mild fibrotic change noted. 2. Soft tissue inflammation about the bladder could reflect cystitis. 3. Moderate bilateral renal  atrophy noted. Scattered bilateral renal cysts, hypodense and hyperdense in nature. 4. Scattered coronary artery calcifications seen. 5. Enlarged prostate noted.  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   Review of Systems  Constitutional: Negative for malaise/fatigue.  Respiratory: Negative for cough and shortness of breath.   Cardiovascular: Negative for chest pain, palpitations and leg swelling.  Gastrointestinal: Negative for abdominal pain, constipation and heartburn.  Musculoskeletal: Negative for back pain, joint pain and myalgias.  Skin: Negative.   Neurological: Negative for dizziness.  Psychiatric/Behavioral: The patient is not nervous/anxious.     Physical Exam  Constitutional: He is oriented to person, place, and time. He appears well-developed and well-nourished. No distress.  Neck: No thyromegaly present.  Cardiovascular: Normal rate, regular rhythm and intact distal pulses.  Murmur heard. 1/6  Pulmonary/Chest: Effort normal and breath sounds normal. No respiratory distress.  02 dependent   Abdominal: Soft. Bowel sounds are normal. He exhibits no distension. There is no tenderness.  Musculoskeletal: Normal range of motion. He exhibits no edema.    Lymphadenopathy:    He has no cervical adenopathy.  Neurological: He is alert and oriented to person, place, and time.  Skin: Skin is warm and dry. He is not diaphoretic.  Right upper extremity A/V fistula + thrill + bruitt      Psychiatric: He has a normal mood and affect.      ASSESSMENT/ PLAN:  Patient is being discharged with the following home health services:  Pt/ot/rn/cna: to evaluate and treat as indicated for gait balance strength adl training medication management and adl care.   Patient is being discharged with the following durable medical equipment:  Front wheel walker to allow him to maintain his current level of independence with his adls.  Patient needs 02 due to chronic respiratory failure saturation test conducted and his 02 sat were 85% during exertion on room air and 92% with 02 at 2L/[place].   Patient has been advised to f/u with their PCP in 1-2 weeks to bring them up to date on their rehab stay.  Social services at facility was responsible for arranging this appointment.  Pt was provided with a 30 day supply of prescriptions for medications and refills must be obtained from their PCP.  For controlled substances, a more limited supply may be provided adequate until PCP appointment only.  A 30 day supply of his prescription medications as listed above  Time spent with patient: 40 minutes: discussed home health needs; dme needed; home 02 medications. Verbalized understanding.   Ok Edwards NP [facility]  Contact [phone] Monday through Friday 8am- 5pm  After hours call [phone]

## 2018-02-28 DIAGNOSIS — E1129 Type 2 diabetes mellitus with other diabetic kidney complication: Secondary | ICD-10-CM | POA: Diagnosis not present

## 2018-02-28 DIAGNOSIS — D631 Anemia in chronic kidney disease: Secondary | ICD-10-CM | POA: Diagnosis not present

## 2018-02-28 DIAGNOSIS — D689 Coagulation defect, unspecified: Secondary | ICD-10-CM | POA: Diagnosis not present

## 2018-02-28 DIAGNOSIS — N2581 Secondary hyperparathyroidism of renal origin: Secondary | ICD-10-CM | POA: Diagnosis not present

## 2018-02-28 DIAGNOSIS — N186 End stage renal disease: Secondary | ICD-10-CM | POA: Diagnosis not present

## 2018-03-02 DIAGNOSIS — N186 End stage renal disease: Secondary | ICD-10-CM | POA: Diagnosis not present

## 2018-03-02 DIAGNOSIS — D689 Coagulation defect, unspecified: Secondary | ICD-10-CM | POA: Diagnosis not present

## 2018-03-02 DIAGNOSIS — E1129 Type 2 diabetes mellitus with other diabetic kidney complication: Secondary | ICD-10-CM | POA: Diagnosis not present

## 2018-03-02 DIAGNOSIS — D631 Anemia in chronic kidney disease: Secondary | ICD-10-CM | POA: Diagnosis not present

## 2018-03-02 DIAGNOSIS — N2581 Secondary hyperparathyroidism of renal origin: Secondary | ICD-10-CM | POA: Diagnosis not present

## 2018-03-04 DIAGNOSIS — N186 End stage renal disease: Secondary | ICD-10-CM | POA: Diagnosis not present

## 2018-03-04 DIAGNOSIS — D631 Anemia in chronic kidney disease: Secondary | ICD-10-CM | POA: Diagnosis not present

## 2018-03-04 DIAGNOSIS — D689 Coagulation defect, unspecified: Secondary | ICD-10-CM | POA: Diagnosis not present

## 2018-03-04 DIAGNOSIS — N2581 Secondary hyperparathyroidism of renal origin: Secondary | ICD-10-CM | POA: Diagnosis not present

## 2018-03-04 DIAGNOSIS — E1129 Type 2 diabetes mellitus with other diabetic kidney complication: Secondary | ICD-10-CM | POA: Diagnosis not present

## 2018-03-07 DIAGNOSIS — D631 Anemia in chronic kidney disease: Secondary | ICD-10-CM | POA: Diagnosis not present

## 2018-03-07 DIAGNOSIS — D689 Coagulation defect, unspecified: Secondary | ICD-10-CM | POA: Diagnosis not present

## 2018-03-07 DIAGNOSIS — N2581 Secondary hyperparathyroidism of renal origin: Secondary | ICD-10-CM | POA: Diagnosis not present

## 2018-03-07 DIAGNOSIS — N186 End stage renal disease: Secondary | ICD-10-CM | POA: Diagnosis not present

## 2018-03-07 DIAGNOSIS — E1129 Type 2 diabetes mellitus with other diabetic kidney complication: Secondary | ICD-10-CM | POA: Diagnosis not present

## 2018-03-09 DIAGNOSIS — N2581 Secondary hyperparathyroidism of renal origin: Secondary | ICD-10-CM | POA: Diagnosis not present

## 2018-03-09 DIAGNOSIS — D689 Coagulation defect, unspecified: Secondary | ICD-10-CM | POA: Diagnosis not present

## 2018-03-09 DIAGNOSIS — N186 End stage renal disease: Secondary | ICD-10-CM | POA: Diagnosis not present

## 2018-03-09 DIAGNOSIS — D631 Anemia in chronic kidney disease: Secondary | ICD-10-CM | POA: Diagnosis not present

## 2018-03-09 DIAGNOSIS — E1129 Type 2 diabetes mellitus with other diabetic kidney complication: Secondary | ICD-10-CM | POA: Diagnosis not present

## 2018-03-11 DIAGNOSIS — E1129 Type 2 diabetes mellitus with other diabetic kidney complication: Secondary | ICD-10-CM | POA: Diagnosis not present

## 2018-03-11 DIAGNOSIS — N2581 Secondary hyperparathyroidism of renal origin: Secondary | ICD-10-CM | POA: Diagnosis not present

## 2018-03-11 DIAGNOSIS — N186 End stage renal disease: Secondary | ICD-10-CM | POA: Diagnosis not present

## 2018-03-11 DIAGNOSIS — D689 Coagulation defect, unspecified: Secondary | ICD-10-CM | POA: Diagnosis not present

## 2018-03-11 DIAGNOSIS — D631 Anemia in chronic kidney disease: Secondary | ICD-10-CM | POA: Diagnosis not present

## 2018-03-12 DIAGNOSIS — Z992 Dependence on renal dialysis: Secondary | ICD-10-CM | POA: Diagnosis not present

## 2018-03-12 DIAGNOSIS — E1122 Type 2 diabetes mellitus with diabetic chronic kidney disease: Secondary | ICD-10-CM | POA: Diagnosis not present

## 2018-03-12 DIAGNOSIS — N186 End stage renal disease: Secondary | ICD-10-CM | POA: Diagnosis not present

## 2018-03-14 DIAGNOSIS — D631 Anemia in chronic kidney disease: Secondary | ICD-10-CM | POA: Diagnosis not present

## 2018-03-14 DIAGNOSIS — D689 Coagulation defect, unspecified: Secondary | ICD-10-CM | POA: Diagnosis not present

## 2018-03-14 DIAGNOSIS — N186 End stage renal disease: Secondary | ICD-10-CM | POA: Diagnosis not present

## 2018-03-14 DIAGNOSIS — E1129 Type 2 diabetes mellitus with other diabetic kidney complication: Secondary | ICD-10-CM | POA: Diagnosis not present

## 2018-03-14 DIAGNOSIS — N2581 Secondary hyperparathyroidism of renal origin: Secondary | ICD-10-CM | POA: Diagnosis not present

## 2018-03-18 DIAGNOSIS — D689 Coagulation defect, unspecified: Secondary | ICD-10-CM | POA: Diagnosis not present

## 2018-03-18 DIAGNOSIS — N186 End stage renal disease: Secondary | ICD-10-CM | POA: Diagnosis not present

## 2018-03-18 DIAGNOSIS — E1129 Type 2 diabetes mellitus with other diabetic kidney complication: Secondary | ICD-10-CM | POA: Diagnosis not present

## 2018-03-18 DIAGNOSIS — N2581 Secondary hyperparathyroidism of renal origin: Secondary | ICD-10-CM | POA: Diagnosis not present

## 2018-03-18 DIAGNOSIS — D631 Anemia in chronic kidney disease: Secondary | ICD-10-CM | POA: Diagnosis not present

## 2018-03-21 DIAGNOSIS — E1129 Type 2 diabetes mellitus with other diabetic kidney complication: Secondary | ICD-10-CM | POA: Diagnosis not present

## 2018-03-21 DIAGNOSIS — N186 End stage renal disease: Secondary | ICD-10-CM | POA: Diagnosis not present

## 2018-03-21 DIAGNOSIS — D689 Coagulation defect, unspecified: Secondary | ICD-10-CM | POA: Diagnosis not present

## 2018-03-21 DIAGNOSIS — D631 Anemia in chronic kidney disease: Secondary | ICD-10-CM | POA: Diagnosis not present

## 2018-03-21 DIAGNOSIS — N2581 Secondary hyperparathyroidism of renal origin: Secondary | ICD-10-CM | POA: Diagnosis not present

## 2018-03-23 DIAGNOSIS — N2581 Secondary hyperparathyroidism of renal origin: Secondary | ICD-10-CM | POA: Diagnosis not present

## 2018-03-23 DIAGNOSIS — D689 Coagulation defect, unspecified: Secondary | ICD-10-CM | POA: Diagnosis not present

## 2018-03-23 DIAGNOSIS — N186 End stage renal disease: Secondary | ICD-10-CM | POA: Diagnosis not present

## 2018-03-23 DIAGNOSIS — D631 Anemia in chronic kidney disease: Secondary | ICD-10-CM | POA: Diagnosis not present

## 2018-03-23 DIAGNOSIS — E1129 Type 2 diabetes mellitus with other diabetic kidney complication: Secondary | ICD-10-CM | POA: Diagnosis not present

## 2018-03-26 DIAGNOSIS — J9611 Chronic respiratory failure with hypoxia: Secondary | ICD-10-CM | POA: Diagnosis not present

## 2018-03-26 DIAGNOSIS — J189 Pneumonia, unspecified organism: Secondary | ICD-10-CM | POA: Diagnosis not present

## 2018-03-28 DIAGNOSIS — N2581 Secondary hyperparathyroidism of renal origin: Secondary | ICD-10-CM | POA: Diagnosis not present

## 2018-03-28 DIAGNOSIS — D631 Anemia in chronic kidney disease: Secondary | ICD-10-CM | POA: Diagnosis not present

## 2018-03-28 DIAGNOSIS — N186 End stage renal disease: Secondary | ICD-10-CM | POA: Diagnosis not present

## 2018-03-28 DIAGNOSIS — E1129 Type 2 diabetes mellitus with other diabetic kidney complication: Secondary | ICD-10-CM | POA: Diagnosis not present

## 2018-03-28 DIAGNOSIS — D689 Coagulation defect, unspecified: Secondary | ICD-10-CM | POA: Diagnosis not present

## 2018-04-01 DIAGNOSIS — D689 Coagulation defect, unspecified: Secondary | ICD-10-CM | POA: Diagnosis not present

## 2018-04-01 DIAGNOSIS — D631 Anemia in chronic kidney disease: Secondary | ICD-10-CM | POA: Diagnosis not present

## 2018-04-01 DIAGNOSIS — N186 End stage renal disease: Secondary | ICD-10-CM | POA: Diagnosis not present

## 2018-04-01 DIAGNOSIS — E1129 Type 2 diabetes mellitus with other diabetic kidney complication: Secondary | ICD-10-CM | POA: Diagnosis not present

## 2018-04-01 DIAGNOSIS — N2581 Secondary hyperparathyroidism of renal origin: Secondary | ICD-10-CM | POA: Diagnosis not present

## 2018-04-04 DIAGNOSIS — N2581 Secondary hyperparathyroidism of renal origin: Secondary | ICD-10-CM | POA: Diagnosis not present

## 2018-04-04 DIAGNOSIS — D631 Anemia in chronic kidney disease: Secondary | ICD-10-CM | POA: Diagnosis not present

## 2018-04-04 DIAGNOSIS — N186 End stage renal disease: Secondary | ICD-10-CM | POA: Diagnosis not present

## 2018-04-04 DIAGNOSIS — E1129 Type 2 diabetes mellitus with other diabetic kidney complication: Secondary | ICD-10-CM | POA: Diagnosis not present

## 2018-04-04 DIAGNOSIS — D689 Coagulation defect, unspecified: Secondary | ICD-10-CM | POA: Diagnosis not present

## 2018-04-06 DIAGNOSIS — E1129 Type 2 diabetes mellitus with other diabetic kidney complication: Secondary | ICD-10-CM | POA: Diagnosis not present

## 2018-04-06 DIAGNOSIS — D631 Anemia in chronic kidney disease: Secondary | ICD-10-CM | POA: Diagnosis not present

## 2018-04-06 DIAGNOSIS — N186 End stage renal disease: Secondary | ICD-10-CM | POA: Diagnosis not present

## 2018-04-06 DIAGNOSIS — D689 Coagulation defect, unspecified: Secondary | ICD-10-CM | POA: Diagnosis not present

## 2018-04-06 DIAGNOSIS — N2581 Secondary hyperparathyroidism of renal origin: Secondary | ICD-10-CM | POA: Diagnosis not present

## 2018-04-08 DIAGNOSIS — E1129 Type 2 diabetes mellitus with other diabetic kidney complication: Secondary | ICD-10-CM | POA: Diagnosis not present

## 2018-04-08 DIAGNOSIS — D689 Coagulation defect, unspecified: Secondary | ICD-10-CM | POA: Diagnosis not present

## 2018-04-08 DIAGNOSIS — D631 Anemia in chronic kidney disease: Secondary | ICD-10-CM | POA: Diagnosis not present

## 2018-04-08 DIAGNOSIS — N186 End stage renal disease: Secondary | ICD-10-CM | POA: Diagnosis not present

## 2018-04-08 DIAGNOSIS — N2581 Secondary hyperparathyroidism of renal origin: Secondary | ICD-10-CM | POA: Diagnosis not present

## 2018-04-11 DIAGNOSIS — N186 End stage renal disease: Secondary | ICD-10-CM | POA: Diagnosis not present

## 2018-04-11 DIAGNOSIS — D689 Coagulation defect, unspecified: Secondary | ICD-10-CM | POA: Diagnosis not present

## 2018-04-11 DIAGNOSIS — D631 Anemia in chronic kidney disease: Secondary | ICD-10-CM | POA: Diagnosis not present

## 2018-04-11 DIAGNOSIS — N2581 Secondary hyperparathyroidism of renal origin: Secondary | ICD-10-CM | POA: Diagnosis not present

## 2018-04-11 DIAGNOSIS — E1129 Type 2 diabetes mellitus with other diabetic kidney complication: Secondary | ICD-10-CM | POA: Diagnosis not present

## 2018-04-12 DIAGNOSIS — N186 End stage renal disease: Secondary | ICD-10-CM | POA: Diagnosis not present

## 2018-04-12 DIAGNOSIS — Z992 Dependence on renal dialysis: Secondary | ICD-10-CM | POA: Diagnosis not present

## 2018-04-12 DIAGNOSIS — E1122 Type 2 diabetes mellitus with diabetic chronic kidney disease: Secondary | ICD-10-CM | POA: Diagnosis not present

## 2018-04-13 DIAGNOSIS — D631 Anemia in chronic kidney disease: Secondary | ICD-10-CM | POA: Diagnosis not present

## 2018-04-13 DIAGNOSIS — N2581 Secondary hyperparathyroidism of renal origin: Secondary | ICD-10-CM | POA: Diagnosis not present

## 2018-04-13 DIAGNOSIS — N186 End stage renal disease: Secondary | ICD-10-CM | POA: Diagnosis not present

## 2018-04-13 DIAGNOSIS — D689 Coagulation defect, unspecified: Secondary | ICD-10-CM | POA: Diagnosis not present

## 2018-04-15 DIAGNOSIS — D689 Coagulation defect, unspecified: Secondary | ICD-10-CM | POA: Diagnosis not present

## 2018-04-15 DIAGNOSIS — D631 Anemia in chronic kidney disease: Secondary | ICD-10-CM | POA: Diagnosis not present

## 2018-04-15 DIAGNOSIS — N186 End stage renal disease: Secondary | ICD-10-CM | POA: Diagnosis not present

## 2018-04-15 DIAGNOSIS — N2581 Secondary hyperparathyroidism of renal origin: Secondary | ICD-10-CM | POA: Diagnosis not present

## 2018-04-18 DIAGNOSIS — D631 Anemia in chronic kidney disease: Secondary | ICD-10-CM | POA: Diagnosis not present

## 2018-04-18 DIAGNOSIS — N2581 Secondary hyperparathyroidism of renal origin: Secondary | ICD-10-CM | POA: Diagnosis not present

## 2018-04-18 DIAGNOSIS — N186 End stage renal disease: Secondary | ICD-10-CM | POA: Diagnosis not present

## 2018-04-18 DIAGNOSIS — D689 Coagulation defect, unspecified: Secondary | ICD-10-CM | POA: Diagnosis not present

## 2018-04-19 DIAGNOSIS — D689 Coagulation defect, unspecified: Secondary | ICD-10-CM | POA: Diagnosis not present

## 2018-04-19 DIAGNOSIS — D631 Anemia in chronic kidney disease: Secondary | ICD-10-CM | POA: Diagnosis not present

## 2018-04-19 DIAGNOSIS — N2581 Secondary hyperparathyroidism of renal origin: Secondary | ICD-10-CM | POA: Diagnosis not present

## 2018-04-19 DIAGNOSIS — N186 End stage renal disease: Secondary | ICD-10-CM | POA: Diagnosis not present

## 2018-04-20 DIAGNOSIS — N2581 Secondary hyperparathyroidism of renal origin: Secondary | ICD-10-CM | POA: Diagnosis not present

## 2018-04-20 DIAGNOSIS — N186 End stage renal disease: Secondary | ICD-10-CM | POA: Diagnosis not present

## 2018-04-20 DIAGNOSIS — D689 Coagulation defect, unspecified: Secondary | ICD-10-CM | POA: Diagnosis not present

## 2018-04-20 DIAGNOSIS — D631 Anemia in chronic kidney disease: Secondary | ICD-10-CM | POA: Diagnosis not present

## 2018-04-22 DIAGNOSIS — D631 Anemia in chronic kidney disease: Secondary | ICD-10-CM | POA: Diagnosis not present

## 2018-04-22 DIAGNOSIS — D689 Coagulation defect, unspecified: Secondary | ICD-10-CM | POA: Diagnosis not present

## 2018-04-22 DIAGNOSIS — N186 End stage renal disease: Secondary | ICD-10-CM | POA: Diagnosis not present

## 2018-04-22 DIAGNOSIS — N2581 Secondary hyperparathyroidism of renal origin: Secondary | ICD-10-CM | POA: Diagnosis not present

## 2018-04-25 DIAGNOSIS — N2581 Secondary hyperparathyroidism of renal origin: Secondary | ICD-10-CM | POA: Diagnosis not present

## 2018-04-25 DIAGNOSIS — D689 Coagulation defect, unspecified: Secondary | ICD-10-CM | POA: Diagnosis not present

## 2018-04-25 DIAGNOSIS — N186 End stage renal disease: Secondary | ICD-10-CM | POA: Diagnosis not present

## 2018-04-25 DIAGNOSIS — D631 Anemia in chronic kidney disease: Secondary | ICD-10-CM | POA: Diagnosis not present

## 2018-04-26 DIAGNOSIS — J9611 Chronic respiratory failure with hypoxia: Secondary | ICD-10-CM | POA: Diagnosis not present

## 2018-04-26 DIAGNOSIS — J189 Pneumonia, unspecified organism: Secondary | ICD-10-CM | POA: Diagnosis not present

## 2018-04-27 DIAGNOSIS — N186 End stage renal disease: Secondary | ICD-10-CM | POA: Diagnosis not present

## 2018-04-27 DIAGNOSIS — D631 Anemia in chronic kidney disease: Secondary | ICD-10-CM | POA: Diagnosis not present

## 2018-04-27 DIAGNOSIS — N2581 Secondary hyperparathyroidism of renal origin: Secondary | ICD-10-CM | POA: Diagnosis not present

## 2018-04-27 DIAGNOSIS — D689 Coagulation defect, unspecified: Secondary | ICD-10-CM | POA: Diagnosis not present

## 2018-04-29 ENCOUNTER — Encounter: Payer: Self-pay | Admitting: Internal Medicine

## 2018-04-29 DIAGNOSIS — D631 Anemia in chronic kidney disease: Secondary | ICD-10-CM | POA: Diagnosis not present

## 2018-04-29 DIAGNOSIS — N2581 Secondary hyperparathyroidism of renal origin: Secondary | ICD-10-CM | POA: Diagnosis not present

## 2018-04-29 DIAGNOSIS — N186 End stage renal disease: Secondary | ICD-10-CM | POA: Diagnosis not present

## 2018-04-29 DIAGNOSIS — D689 Coagulation defect, unspecified: Secondary | ICD-10-CM | POA: Diagnosis not present

## 2018-05-02 DIAGNOSIS — N2581 Secondary hyperparathyroidism of renal origin: Secondary | ICD-10-CM | POA: Diagnosis not present

## 2018-05-02 DIAGNOSIS — D689 Coagulation defect, unspecified: Secondary | ICD-10-CM | POA: Diagnosis not present

## 2018-05-02 DIAGNOSIS — N186 End stage renal disease: Secondary | ICD-10-CM | POA: Diagnosis not present

## 2018-05-02 DIAGNOSIS — D631 Anemia in chronic kidney disease: Secondary | ICD-10-CM | POA: Diagnosis not present

## 2018-05-04 DIAGNOSIS — N2581 Secondary hyperparathyroidism of renal origin: Secondary | ICD-10-CM | POA: Diagnosis not present

## 2018-05-04 DIAGNOSIS — D631 Anemia in chronic kidney disease: Secondary | ICD-10-CM | POA: Diagnosis not present

## 2018-05-04 DIAGNOSIS — D689 Coagulation defect, unspecified: Secondary | ICD-10-CM | POA: Diagnosis not present

## 2018-05-04 DIAGNOSIS — N186 End stage renal disease: Secondary | ICD-10-CM | POA: Diagnosis not present

## 2018-05-06 DIAGNOSIS — D689 Coagulation defect, unspecified: Secondary | ICD-10-CM | POA: Diagnosis not present

## 2018-05-06 DIAGNOSIS — N2581 Secondary hyperparathyroidism of renal origin: Secondary | ICD-10-CM | POA: Diagnosis not present

## 2018-05-06 DIAGNOSIS — N186 End stage renal disease: Secondary | ICD-10-CM | POA: Diagnosis not present

## 2018-05-06 DIAGNOSIS — D631 Anemia in chronic kidney disease: Secondary | ICD-10-CM | POA: Diagnosis not present

## 2018-05-09 DIAGNOSIS — N186 End stage renal disease: Secondary | ICD-10-CM | POA: Diagnosis not present

## 2018-05-09 DIAGNOSIS — D631 Anemia in chronic kidney disease: Secondary | ICD-10-CM | POA: Diagnosis not present

## 2018-05-09 DIAGNOSIS — N2581 Secondary hyperparathyroidism of renal origin: Secondary | ICD-10-CM | POA: Diagnosis not present

## 2018-05-09 DIAGNOSIS — D689 Coagulation defect, unspecified: Secondary | ICD-10-CM | POA: Diagnosis not present

## 2018-05-11 DIAGNOSIS — D631 Anemia in chronic kidney disease: Secondary | ICD-10-CM | POA: Diagnosis not present

## 2018-05-11 DIAGNOSIS — D689 Coagulation defect, unspecified: Secondary | ICD-10-CM | POA: Diagnosis not present

## 2018-05-11 DIAGNOSIS — N2581 Secondary hyperparathyroidism of renal origin: Secondary | ICD-10-CM | POA: Diagnosis not present

## 2018-05-11 DIAGNOSIS — N186 End stage renal disease: Secondary | ICD-10-CM | POA: Diagnosis not present

## 2018-05-13 DIAGNOSIS — D631 Anemia in chronic kidney disease: Secondary | ICD-10-CM | POA: Diagnosis not present

## 2018-05-13 DIAGNOSIS — E1122 Type 2 diabetes mellitus with diabetic chronic kidney disease: Secondary | ICD-10-CM | POA: Diagnosis not present

## 2018-05-13 DIAGNOSIS — D689 Coagulation defect, unspecified: Secondary | ICD-10-CM | POA: Diagnosis not present

## 2018-05-13 DIAGNOSIS — N186 End stage renal disease: Secondary | ICD-10-CM | POA: Diagnosis not present

## 2018-05-13 DIAGNOSIS — Z992 Dependence on renal dialysis: Secondary | ICD-10-CM | POA: Diagnosis not present

## 2018-05-13 DIAGNOSIS — N2581 Secondary hyperparathyroidism of renal origin: Secondary | ICD-10-CM | POA: Diagnosis not present

## 2018-05-16 DIAGNOSIS — D631 Anemia in chronic kidney disease: Secondary | ICD-10-CM | POA: Diagnosis not present

## 2018-05-16 DIAGNOSIS — Z23 Encounter for immunization: Secondary | ICD-10-CM | POA: Diagnosis not present

## 2018-05-16 DIAGNOSIS — D689 Coagulation defect, unspecified: Secondary | ICD-10-CM | POA: Diagnosis not present

## 2018-05-16 DIAGNOSIS — N186 End stage renal disease: Secondary | ICD-10-CM | POA: Diagnosis not present

## 2018-05-16 DIAGNOSIS — N2581 Secondary hyperparathyroidism of renal origin: Secondary | ICD-10-CM | POA: Diagnosis not present

## 2018-05-18 DIAGNOSIS — D689 Coagulation defect, unspecified: Secondary | ICD-10-CM | POA: Diagnosis not present

## 2018-05-18 DIAGNOSIS — N186 End stage renal disease: Secondary | ICD-10-CM | POA: Diagnosis not present

## 2018-05-18 DIAGNOSIS — Z23 Encounter for immunization: Secondary | ICD-10-CM | POA: Diagnosis not present

## 2018-05-18 DIAGNOSIS — D631 Anemia in chronic kidney disease: Secondary | ICD-10-CM | POA: Diagnosis not present

## 2018-05-18 DIAGNOSIS — N2581 Secondary hyperparathyroidism of renal origin: Secondary | ICD-10-CM | POA: Diagnosis not present

## 2018-05-20 DIAGNOSIS — D631 Anemia in chronic kidney disease: Secondary | ICD-10-CM | POA: Diagnosis not present

## 2018-05-20 DIAGNOSIS — D689 Coagulation defect, unspecified: Secondary | ICD-10-CM | POA: Diagnosis not present

## 2018-05-20 DIAGNOSIS — Z23 Encounter for immunization: Secondary | ICD-10-CM | POA: Diagnosis not present

## 2018-05-20 DIAGNOSIS — N186 End stage renal disease: Secondary | ICD-10-CM | POA: Diagnosis not present

## 2018-05-20 DIAGNOSIS — N2581 Secondary hyperparathyroidism of renal origin: Secondary | ICD-10-CM | POA: Diagnosis not present

## 2018-05-23 DIAGNOSIS — N186 End stage renal disease: Secondary | ICD-10-CM | POA: Diagnosis not present

## 2018-05-23 DIAGNOSIS — Z23 Encounter for immunization: Secondary | ICD-10-CM | POA: Diagnosis not present

## 2018-05-23 DIAGNOSIS — D631 Anemia in chronic kidney disease: Secondary | ICD-10-CM | POA: Diagnosis not present

## 2018-05-23 DIAGNOSIS — N2581 Secondary hyperparathyroidism of renal origin: Secondary | ICD-10-CM | POA: Diagnosis not present

## 2018-05-23 DIAGNOSIS — D689 Coagulation defect, unspecified: Secondary | ICD-10-CM | POA: Diagnosis not present

## 2018-05-25 DIAGNOSIS — Z23 Encounter for immunization: Secondary | ICD-10-CM | POA: Diagnosis not present

## 2018-05-25 DIAGNOSIS — D689 Coagulation defect, unspecified: Secondary | ICD-10-CM | POA: Diagnosis not present

## 2018-05-25 DIAGNOSIS — D631 Anemia in chronic kidney disease: Secondary | ICD-10-CM | POA: Diagnosis not present

## 2018-05-25 DIAGNOSIS — N186 End stage renal disease: Secondary | ICD-10-CM | POA: Diagnosis not present

## 2018-05-25 DIAGNOSIS — N2581 Secondary hyperparathyroidism of renal origin: Secondary | ICD-10-CM | POA: Diagnosis not present

## 2018-05-27 DIAGNOSIS — J9611 Chronic respiratory failure with hypoxia: Secondary | ICD-10-CM | POA: Diagnosis not present

## 2018-05-27 DIAGNOSIS — J189 Pneumonia, unspecified organism: Secondary | ICD-10-CM | POA: Diagnosis not present

## 2018-05-30 DIAGNOSIS — N186 End stage renal disease: Secondary | ICD-10-CM | POA: Diagnosis not present

## 2018-05-30 DIAGNOSIS — D689 Coagulation defect, unspecified: Secondary | ICD-10-CM | POA: Diagnosis not present

## 2018-05-30 DIAGNOSIS — Z23 Encounter for immunization: Secondary | ICD-10-CM | POA: Diagnosis not present

## 2018-05-30 DIAGNOSIS — N2581 Secondary hyperparathyroidism of renal origin: Secondary | ICD-10-CM | POA: Diagnosis not present

## 2018-05-30 DIAGNOSIS — D631 Anemia in chronic kidney disease: Secondary | ICD-10-CM | POA: Diagnosis not present

## 2018-06-01 DIAGNOSIS — Z23 Encounter for immunization: Secondary | ICD-10-CM | POA: Diagnosis not present

## 2018-06-01 DIAGNOSIS — D689 Coagulation defect, unspecified: Secondary | ICD-10-CM | POA: Diagnosis not present

## 2018-06-01 DIAGNOSIS — D631 Anemia in chronic kidney disease: Secondary | ICD-10-CM | POA: Diagnosis not present

## 2018-06-01 DIAGNOSIS — N2581 Secondary hyperparathyroidism of renal origin: Secondary | ICD-10-CM | POA: Diagnosis not present

## 2018-06-01 DIAGNOSIS — N186 End stage renal disease: Secondary | ICD-10-CM | POA: Diagnosis not present

## 2018-06-06 DIAGNOSIS — N186 End stage renal disease: Secondary | ICD-10-CM | POA: Diagnosis not present

## 2018-06-06 DIAGNOSIS — N2581 Secondary hyperparathyroidism of renal origin: Secondary | ICD-10-CM | POA: Diagnosis not present

## 2018-06-06 DIAGNOSIS — Z23 Encounter for immunization: Secondary | ICD-10-CM | POA: Diagnosis not present

## 2018-06-06 DIAGNOSIS — D631 Anemia in chronic kidney disease: Secondary | ICD-10-CM | POA: Diagnosis not present

## 2018-06-06 DIAGNOSIS — D689 Coagulation defect, unspecified: Secondary | ICD-10-CM | POA: Diagnosis not present

## 2018-06-08 DIAGNOSIS — N2581 Secondary hyperparathyroidism of renal origin: Secondary | ICD-10-CM | POA: Diagnosis not present

## 2018-06-08 DIAGNOSIS — D689 Coagulation defect, unspecified: Secondary | ICD-10-CM | POA: Diagnosis not present

## 2018-06-08 DIAGNOSIS — Z23 Encounter for immunization: Secondary | ICD-10-CM | POA: Diagnosis not present

## 2018-06-08 DIAGNOSIS — N186 End stage renal disease: Secondary | ICD-10-CM | POA: Diagnosis not present

## 2018-06-08 DIAGNOSIS — D631 Anemia in chronic kidney disease: Secondary | ICD-10-CM | POA: Diagnosis not present

## 2018-06-10 DIAGNOSIS — N2581 Secondary hyperparathyroidism of renal origin: Secondary | ICD-10-CM | POA: Diagnosis not present

## 2018-06-10 DIAGNOSIS — N186 End stage renal disease: Secondary | ICD-10-CM | POA: Diagnosis not present

## 2018-06-10 DIAGNOSIS — D631 Anemia in chronic kidney disease: Secondary | ICD-10-CM | POA: Diagnosis not present

## 2018-06-10 DIAGNOSIS — D689 Coagulation defect, unspecified: Secondary | ICD-10-CM | POA: Diagnosis not present

## 2018-06-10 DIAGNOSIS — Z23 Encounter for immunization: Secondary | ICD-10-CM | POA: Diagnosis not present

## 2018-06-12 DIAGNOSIS — Z992 Dependence on renal dialysis: Secondary | ICD-10-CM | POA: Diagnosis not present

## 2018-06-12 DIAGNOSIS — N186 End stage renal disease: Secondary | ICD-10-CM | POA: Diagnosis not present

## 2018-06-12 DIAGNOSIS — E1122 Type 2 diabetes mellitus with diabetic chronic kidney disease: Secondary | ICD-10-CM | POA: Diagnosis not present

## 2018-06-13 DIAGNOSIS — D631 Anemia in chronic kidney disease: Secondary | ICD-10-CM | POA: Diagnosis not present

## 2018-06-13 DIAGNOSIS — E1129 Type 2 diabetes mellitus with other diabetic kidney complication: Secondary | ICD-10-CM | POA: Diagnosis not present

## 2018-06-13 DIAGNOSIS — D689 Coagulation defect, unspecified: Secondary | ICD-10-CM | POA: Diagnosis not present

## 2018-06-13 DIAGNOSIS — N2581 Secondary hyperparathyroidism of renal origin: Secondary | ICD-10-CM | POA: Diagnosis not present

## 2018-06-13 DIAGNOSIS — N186 End stage renal disease: Secondary | ICD-10-CM | POA: Diagnosis not present

## 2018-06-15 DIAGNOSIS — D631 Anemia in chronic kidney disease: Secondary | ICD-10-CM | POA: Diagnosis not present

## 2018-06-15 DIAGNOSIS — N186 End stage renal disease: Secondary | ICD-10-CM | POA: Diagnosis not present

## 2018-06-15 DIAGNOSIS — E1129 Type 2 diabetes mellitus with other diabetic kidney complication: Secondary | ICD-10-CM | POA: Diagnosis not present

## 2018-06-15 DIAGNOSIS — N2581 Secondary hyperparathyroidism of renal origin: Secondary | ICD-10-CM | POA: Diagnosis not present

## 2018-06-15 DIAGNOSIS — D689 Coagulation defect, unspecified: Secondary | ICD-10-CM | POA: Diagnosis not present

## 2018-06-17 DIAGNOSIS — D631 Anemia in chronic kidney disease: Secondary | ICD-10-CM | POA: Diagnosis not present

## 2018-06-17 DIAGNOSIS — D689 Coagulation defect, unspecified: Secondary | ICD-10-CM | POA: Diagnosis not present

## 2018-06-17 DIAGNOSIS — N186 End stage renal disease: Secondary | ICD-10-CM | POA: Diagnosis not present

## 2018-06-17 DIAGNOSIS — N2581 Secondary hyperparathyroidism of renal origin: Secondary | ICD-10-CM | POA: Diagnosis not present

## 2018-06-17 DIAGNOSIS — E1129 Type 2 diabetes mellitus with other diabetic kidney complication: Secondary | ICD-10-CM | POA: Diagnosis not present

## 2018-06-22 DIAGNOSIS — D689 Coagulation defect, unspecified: Secondary | ICD-10-CM | POA: Diagnosis not present

## 2018-06-22 DIAGNOSIS — D631 Anemia in chronic kidney disease: Secondary | ICD-10-CM | POA: Diagnosis not present

## 2018-06-22 DIAGNOSIS — E1129 Type 2 diabetes mellitus with other diabetic kidney complication: Secondary | ICD-10-CM | POA: Diagnosis not present

## 2018-06-22 DIAGNOSIS — N186 End stage renal disease: Secondary | ICD-10-CM | POA: Diagnosis not present

## 2018-06-22 DIAGNOSIS — N2581 Secondary hyperparathyroidism of renal origin: Secondary | ICD-10-CM | POA: Diagnosis not present

## 2018-06-24 DIAGNOSIS — D631 Anemia in chronic kidney disease: Secondary | ICD-10-CM | POA: Diagnosis not present

## 2018-06-24 DIAGNOSIS — E1129 Type 2 diabetes mellitus with other diabetic kidney complication: Secondary | ICD-10-CM | POA: Diagnosis not present

## 2018-06-24 DIAGNOSIS — D689 Coagulation defect, unspecified: Secondary | ICD-10-CM | POA: Diagnosis not present

## 2018-06-24 DIAGNOSIS — N186 End stage renal disease: Secondary | ICD-10-CM | POA: Diagnosis not present

## 2018-06-24 DIAGNOSIS — N2581 Secondary hyperparathyroidism of renal origin: Secondary | ICD-10-CM | POA: Diagnosis not present

## 2018-06-26 DIAGNOSIS — J189 Pneumonia, unspecified organism: Secondary | ICD-10-CM | POA: Diagnosis not present

## 2018-06-26 DIAGNOSIS — J9611 Chronic respiratory failure with hypoxia: Secondary | ICD-10-CM | POA: Diagnosis not present

## 2018-06-27 DIAGNOSIS — N2581 Secondary hyperparathyroidism of renal origin: Secondary | ICD-10-CM | POA: Diagnosis not present

## 2018-06-27 DIAGNOSIS — D689 Coagulation defect, unspecified: Secondary | ICD-10-CM | POA: Diagnosis not present

## 2018-06-27 DIAGNOSIS — D631 Anemia in chronic kidney disease: Secondary | ICD-10-CM | POA: Diagnosis not present

## 2018-06-27 DIAGNOSIS — E1129 Type 2 diabetes mellitus with other diabetic kidney complication: Secondary | ICD-10-CM | POA: Diagnosis not present

## 2018-06-27 DIAGNOSIS — N186 End stage renal disease: Secondary | ICD-10-CM | POA: Diagnosis not present

## 2018-07-01 DIAGNOSIS — D689 Coagulation defect, unspecified: Secondary | ICD-10-CM | POA: Diagnosis not present

## 2018-07-01 DIAGNOSIS — D631 Anemia in chronic kidney disease: Secondary | ICD-10-CM | POA: Diagnosis not present

## 2018-07-01 DIAGNOSIS — N186 End stage renal disease: Secondary | ICD-10-CM | POA: Diagnosis not present

## 2018-07-01 DIAGNOSIS — N2581 Secondary hyperparathyroidism of renal origin: Secondary | ICD-10-CM | POA: Diagnosis not present

## 2018-07-01 DIAGNOSIS — E1129 Type 2 diabetes mellitus with other diabetic kidney complication: Secondary | ICD-10-CM | POA: Diagnosis not present

## 2018-07-04 DIAGNOSIS — D631 Anemia in chronic kidney disease: Secondary | ICD-10-CM | POA: Diagnosis not present

## 2018-07-04 DIAGNOSIS — E1129 Type 2 diabetes mellitus with other diabetic kidney complication: Secondary | ICD-10-CM | POA: Diagnosis not present

## 2018-07-04 DIAGNOSIS — D689 Coagulation defect, unspecified: Secondary | ICD-10-CM | POA: Diagnosis not present

## 2018-07-04 DIAGNOSIS — N2581 Secondary hyperparathyroidism of renal origin: Secondary | ICD-10-CM | POA: Diagnosis not present

## 2018-07-04 DIAGNOSIS — N186 End stage renal disease: Secondary | ICD-10-CM | POA: Diagnosis not present

## 2018-07-06 DIAGNOSIS — E1129 Type 2 diabetes mellitus with other diabetic kidney complication: Secondary | ICD-10-CM | POA: Diagnosis not present

## 2018-07-06 DIAGNOSIS — D631 Anemia in chronic kidney disease: Secondary | ICD-10-CM | POA: Diagnosis not present

## 2018-07-06 DIAGNOSIS — D689 Coagulation defect, unspecified: Secondary | ICD-10-CM | POA: Diagnosis not present

## 2018-07-06 DIAGNOSIS — N2581 Secondary hyperparathyroidism of renal origin: Secondary | ICD-10-CM | POA: Diagnosis not present

## 2018-07-06 DIAGNOSIS — N186 End stage renal disease: Secondary | ICD-10-CM | POA: Diagnosis not present

## 2018-07-08 DIAGNOSIS — E1129 Type 2 diabetes mellitus with other diabetic kidney complication: Secondary | ICD-10-CM | POA: Diagnosis not present

## 2018-07-08 DIAGNOSIS — N186 End stage renal disease: Secondary | ICD-10-CM | POA: Diagnosis not present

## 2018-07-08 DIAGNOSIS — N2581 Secondary hyperparathyroidism of renal origin: Secondary | ICD-10-CM | POA: Diagnosis not present

## 2018-07-08 DIAGNOSIS — D631 Anemia in chronic kidney disease: Secondary | ICD-10-CM | POA: Diagnosis not present

## 2018-07-08 DIAGNOSIS — D689 Coagulation defect, unspecified: Secondary | ICD-10-CM | POA: Diagnosis not present

## 2018-07-11 DIAGNOSIS — E1129 Type 2 diabetes mellitus with other diabetic kidney complication: Secondary | ICD-10-CM | POA: Diagnosis not present

## 2018-07-11 DIAGNOSIS — D631 Anemia in chronic kidney disease: Secondary | ICD-10-CM | POA: Diagnosis not present

## 2018-07-11 DIAGNOSIS — N186 End stage renal disease: Secondary | ICD-10-CM | POA: Diagnosis not present

## 2018-07-11 DIAGNOSIS — N2581 Secondary hyperparathyroidism of renal origin: Secondary | ICD-10-CM | POA: Diagnosis not present

## 2018-07-11 DIAGNOSIS — D689 Coagulation defect, unspecified: Secondary | ICD-10-CM | POA: Diagnosis not present

## 2018-07-13 DIAGNOSIS — Z992 Dependence on renal dialysis: Secondary | ICD-10-CM | POA: Diagnosis not present

## 2018-07-13 DIAGNOSIS — E1122 Type 2 diabetes mellitus with diabetic chronic kidney disease: Secondary | ICD-10-CM | POA: Diagnosis not present

## 2018-07-13 DIAGNOSIS — N186 End stage renal disease: Secondary | ICD-10-CM | POA: Diagnosis not present

## 2018-07-15 DIAGNOSIS — E1129 Type 2 diabetes mellitus with other diabetic kidney complication: Secondary | ICD-10-CM | POA: Diagnosis not present

## 2018-07-15 DIAGNOSIS — D689 Coagulation defect, unspecified: Secondary | ICD-10-CM | POA: Diagnosis not present

## 2018-07-15 DIAGNOSIS — N2581 Secondary hyperparathyroidism of renal origin: Secondary | ICD-10-CM | POA: Diagnosis not present

## 2018-07-15 DIAGNOSIS — N186 End stage renal disease: Secondary | ICD-10-CM | POA: Diagnosis not present

## 2018-07-18 DIAGNOSIS — N186 End stage renal disease: Secondary | ICD-10-CM | POA: Diagnosis not present

## 2018-07-18 DIAGNOSIS — N2581 Secondary hyperparathyroidism of renal origin: Secondary | ICD-10-CM | POA: Diagnosis not present

## 2018-07-18 DIAGNOSIS — E1129 Type 2 diabetes mellitus with other diabetic kidney complication: Secondary | ICD-10-CM | POA: Diagnosis not present

## 2018-07-18 DIAGNOSIS — D689 Coagulation defect, unspecified: Secondary | ICD-10-CM | POA: Diagnosis not present

## 2018-07-20 DIAGNOSIS — D689 Coagulation defect, unspecified: Secondary | ICD-10-CM | POA: Diagnosis not present

## 2018-07-20 DIAGNOSIS — N2581 Secondary hyperparathyroidism of renal origin: Secondary | ICD-10-CM | POA: Diagnosis not present

## 2018-07-20 DIAGNOSIS — N186 End stage renal disease: Secondary | ICD-10-CM | POA: Diagnosis not present

## 2018-07-20 DIAGNOSIS — E1129 Type 2 diabetes mellitus with other diabetic kidney complication: Secondary | ICD-10-CM | POA: Diagnosis not present

## 2018-07-22 DIAGNOSIS — D689 Coagulation defect, unspecified: Secondary | ICD-10-CM | POA: Diagnosis not present

## 2018-07-22 DIAGNOSIS — E1129 Type 2 diabetes mellitus with other diabetic kidney complication: Secondary | ICD-10-CM | POA: Diagnosis not present

## 2018-07-22 DIAGNOSIS — N2581 Secondary hyperparathyroidism of renal origin: Secondary | ICD-10-CM | POA: Diagnosis not present

## 2018-07-22 DIAGNOSIS — N186 End stage renal disease: Secondary | ICD-10-CM | POA: Diagnosis not present

## 2018-07-25 DIAGNOSIS — D689 Coagulation defect, unspecified: Secondary | ICD-10-CM | POA: Diagnosis not present

## 2018-07-25 DIAGNOSIS — N2581 Secondary hyperparathyroidism of renal origin: Secondary | ICD-10-CM | POA: Diagnosis not present

## 2018-07-25 DIAGNOSIS — N186 End stage renal disease: Secondary | ICD-10-CM | POA: Diagnosis not present

## 2018-07-25 DIAGNOSIS — E1129 Type 2 diabetes mellitus with other diabetic kidney complication: Secondary | ICD-10-CM | POA: Diagnosis not present

## 2018-07-27 DIAGNOSIS — N2581 Secondary hyperparathyroidism of renal origin: Secondary | ICD-10-CM | POA: Diagnosis not present

## 2018-07-27 DIAGNOSIS — E1129 Type 2 diabetes mellitus with other diabetic kidney complication: Secondary | ICD-10-CM | POA: Diagnosis not present

## 2018-07-27 DIAGNOSIS — J189 Pneumonia, unspecified organism: Secondary | ICD-10-CM | POA: Diagnosis not present

## 2018-07-27 DIAGNOSIS — J9611 Chronic respiratory failure with hypoxia: Secondary | ICD-10-CM | POA: Diagnosis not present

## 2018-07-27 DIAGNOSIS — N186 End stage renal disease: Secondary | ICD-10-CM | POA: Diagnosis not present

## 2018-07-27 DIAGNOSIS — D689 Coagulation defect, unspecified: Secondary | ICD-10-CM | POA: Diagnosis not present

## 2018-07-29 DIAGNOSIS — E1129 Type 2 diabetes mellitus with other diabetic kidney complication: Secondary | ICD-10-CM | POA: Diagnosis not present

## 2018-07-29 DIAGNOSIS — D689 Coagulation defect, unspecified: Secondary | ICD-10-CM | POA: Diagnosis not present

## 2018-07-29 DIAGNOSIS — N2581 Secondary hyperparathyroidism of renal origin: Secondary | ICD-10-CM | POA: Diagnosis not present

## 2018-07-29 DIAGNOSIS — N186 End stage renal disease: Secondary | ICD-10-CM | POA: Diagnosis not present

## 2018-08-01 DIAGNOSIS — D689 Coagulation defect, unspecified: Secondary | ICD-10-CM | POA: Diagnosis not present

## 2018-08-01 DIAGNOSIS — E1129 Type 2 diabetes mellitus with other diabetic kidney complication: Secondary | ICD-10-CM | POA: Diagnosis not present

## 2018-08-01 DIAGNOSIS — N186 End stage renal disease: Secondary | ICD-10-CM | POA: Diagnosis not present

## 2018-08-01 DIAGNOSIS — N2581 Secondary hyperparathyroidism of renal origin: Secondary | ICD-10-CM | POA: Diagnosis not present

## 2018-08-03 DIAGNOSIS — E1129 Type 2 diabetes mellitus with other diabetic kidney complication: Secondary | ICD-10-CM | POA: Diagnosis not present

## 2018-08-03 DIAGNOSIS — D689 Coagulation defect, unspecified: Secondary | ICD-10-CM | POA: Diagnosis not present

## 2018-08-03 DIAGNOSIS — N2581 Secondary hyperparathyroidism of renal origin: Secondary | ICD-10-CM | POA: Diagnosis not present

## 2018-08-03 DIAGNOSIS — N186 End stage renal disease: Secondary | ICD-10-CM | POA: Diagnosis not present

## 2018-08-07 DIAGNOSIS — E1129 Type 2 diabetes mellitus with other diabetic kidney complication: Secondary | ICD-10-CM | POA: Diagnosis not present

## 2018-08-07 DIAGNOSIS — D689 Coagulation defect, unspecified: Secondary | ICD-10-CM | POA: Diagnosis not present

## 2018-08-07 DIAGNOSIS — N186 End stage renal disease: Secondary | ICD-10-CM | POA: Diagnosis not present

## 2018-08-07 DIAGNOSIS — N2581 Secondary hyperparathyroidism of renal origin: Secondary | ICD-10-CM | POA: Diagnosis not present

## 2018-08-09 DIAGNOSIS — N186 End stage renal disease: Secondary | ICD-10-CM | POA: Diagnosis not present

## 2018-08-09 DIAGNOSIS — N2581 Secondary hyperparathyroidism of renal origin: Secondary | ICD-10-CM | POA: Diagnosis not present

## 2018-08-09 DIAGNOSIS — D689 Coagulation defect, unspecified: Secondary | ICD-10-CM | POA: Diagnosis not present

## 2018-08-09 DIAGNOSIS — E1129 Type 2 diabetes mellitus with other diabetic kidney complication: Secondary | ICD-10-CM | POA: Diagnosis not present

## 2018-08-12 DIAGNOSIS — Z992 Dependence on renal dialysis: Secondary | ICD-10-CM | POA: Diagnosis not present

## 2018-08-12 DIAGNOSIS — E1122 Type 2 diabetes mellitus with diabetic chronic kidney disease: Secondary | ICD-10-CM | POA: Diagnosis not present

## 2018-08-12 DIAGNOSIS — E1129 Type 2 diabetes mellitus with other diabetic kidney complication: Secondary | ICD-10-CM | POA: Diagnosis not present

## 2018-08-12 DIAGNOSIS — D689 Coagulation defect, unspecified: Secondary | ICD-10-CM | POA: Diagnosis not present

## 2018-08-12 DIAGNOSIS — N2581 Secondary hyperparathyroidism of renal origin: Secondary | ICD-10-CM | POA: Diagnosis not present

## 2018-08-12 DIAGNOSIS — N186 End stage renal disease: Secondary | ICD-10-CM | POA: Diagnosis not present

## 2018-08-14 DIAGNOSIS — E1129 Type 2 diabetes mellitus with other diabetic kidney complication: Secondary | ICD-10-CM | POA: Diagnosis not present

## 2018-08-14 DIAGNOSIS — N2581 Secondary hyperparathyroidism of renal origin: Secondary | ICD-10-CM | POA: Diagnosis not present

## 2018-08-14 DIAGNOSIS — N186 End stage renal disease: Secondary | ICD-10-CM | POA: Diagnosis not present

## 2018-08-14 DIAGNOSIS — D689 Coagulation defect, unspecified: Secondary | ICD-10-CM | POA: Diagnosis not present

## 2018-08-15 DIAGNOSIS — E1129 Type 2 diabetes mellitus with other diabetic kidney complication: Secondary | ICD-10-CM | POA: Diagnosis not present

## 2018-08-15 DIAGNOSIS — N186 End stage renal disease: Secondary | ICD-10-CM | POA: Diagnosis not present

## 2018-08-15 DIAGNOSIS — N2581 Secondary hyperparathyroidism of renal origin: Secondary | ICD-10-CM | POA: Diagnosis not present

## 2018-08-15 DIAGNOSIS — D689 Coagulation defect, unspecified: Secondary | ICD-10-CM | POA: Diagnosis not present

## 2018-08-17 DIAGNOSIS — D689 Coagulation defect, unspecified: Secondary | ICD-10-CM | POA: Diagnosis not present

## 2018-08-17 DIAGNOSIS — N186 End stage renal disease: Secondary | ICD-10-CM | POA: Diagnosis not present

## 2018-08-17 DIAGNOSIS — E1129 Type 2 diabetes mellitus with other diabetic kidney complication: Secondary | ICD-10-CM | POA: Diagnosis not present

## 2018-08-17 DIAGNOSIS — N2581 Secondary hyperparathyroidism of renal origin: Secondary | ICD-10-CM | POA: Diagnosis not present

## 2018-08-19 DIAGNOSIS — N2581 Secondary hyperparathyroidism of renal origin: Secondary | ICD-10-CM | POA: Diagnosis not present

## 2018-08-19 DIAGNOSIS — N186 End stage renal disease: Secondary | ICD-10-CM | POA: Diagnosis not present

## 2018-08-19 DIAGNOSIS — E1129 Type 2 diabetes mellitus with other diabetic kidney complication: Secondary | ICD-10-CM | POA: Diagnosis not present

## 2018-08-19 DIAGNOSIS — D689 Coagulation defect, unspecified: Secondary | ICD-10-CM | POA: Diagnosis not present

## 2018-08-22 DIAGNOSIS — N2581 Secondary hyperparathyroidism of renal origin: Secondary | ICD-10-CM | POA: Diagnosis not present

## 2018-08-22 DIAGNOSIS — N186 End stage renal disease: Secondary | ICD-10-CM | POA: Diagnosis not present

## 2018-08-22 DIAGNOSIS — D689 Coagulation defect, unspecified: Secondary | ICD-10-CM | POA: Diagnosis not present

## 2018-08-22 DIAGNOSIS — E1129 Type 2 diabetes mellitus with other diabetic kidney complication: Secondary | ICD-10-CM | POA: Diagnosis not present

## 2018-08-24 DIAGNOSIS — N2581 Secondary hyperparathyroidism of renal origin: Secondary | ICD-10-CM | POA: Diagnosis not present

## 2018-08-24 DIAGNOSIS — E1129 Type 2 diabetes mellitus with other diabetic kidney complication: Secondary | ICD-10-CM | POA: Diagnosis not present

## 2018-08-24 DIAGNOSIS — N186 End stage renal disease: Secondary | ICD-10-CM | POA: Diagnosis not present

## 2018-08-24 DIAGNOSIS — D689 Coagulation defect, unspecified: Secondary | ICD-10-CM | POA: Diagnosis not present

## 2018-08-29 DIAGNOSIS — N2581 Secondary hyperparathyroidism of renal origin: Secondary | ICD-10-CM | POA: Diagnosis not present

## 2018-08-29 DIAGNOSIS — N186 End stage renal disease: Secondary | ICD-10-CM | POA: Diagnosis not present

## 2018-08-29 DIAGNOSIS — E1129 Type 2 diabetes mellitus with other diabetic kidney complication: Secondary | ICD-10-CM | POA: Diagnosis not present

## 2018-08-29 DIAGNOSIS — D689 Coagulation defect, unspecified: Secondary | ICD-10-CM | POA: Diagnosis not present

## 2018-08-31 DIAGNOSIS — E1129 Type 2 diabetes mellitus with other diabetic kidney complication: Secondary | ICD-10-CM | POA: Diagnosis not present

## 2018-08-31 DIAGNOSIS — N2581 Secondary hyperparathyroidism of renal origin: Secondary | ICD-10-CM | POA: Diagnosis not present

## 2018-08-31 DIAGNOSIS — N186 End stage renal disease: Secondary | ICD-10-CM | POA: Diagnosis not present

## 2018-08-31 DIAGNOSIS — D689 Coagulation defect, unspecified: Secondary | ICD-10-CM | POA: Diagnosis not present

## 2018-09-02 DIAGNOSIS — N2581 Secondary hyperparathyroidism of renal origin: Secondary | ICD-10-CM | POA: Diagnosis not present

## 2018-09-02 DIAGNOSIS — N186 End stage renal disease: Secondary | ICD-10-CM | POA: Diagnosis not present

## 2018-09-02 DIAGNOSIS — E1129 Type 2 diabetes mellitus with other diabetic kidney complication: Secondary | ICD-10-CM | POA: Diagnosis not present

## 2018-09-02 DIAGNOSIS — D689 Coagulation defect, unspecified: Secondary | ICD-10-CM | POA: Diagnosis not present

## 2018-09-07 DIAGNOSIS — E1129 Type 2 diabetes mellitus with other diabetic kidney complication: Secondary | ICD-10-CM | POA: Diagnosis not present

## 2018-09-07 DIAGNOSIS — N2581 Secondary hyperparathyroidism of renal origin: Secondary | ICD-10-CM | POA: Diagnosis not present

## 2018-09-07 DIAGNOSIS — N186 End stage renal disease: Secondary | ICD-10-CM | POA: Diagnosis not present

## 2018-09-07 DIAGNOSIS — D689 Coagulation defect, unspecified: Secondary | ICD-10-CM | POA: Diagnosis not present

## 2018-09-09 DIAGNOSIS — D689 Coagulation defect, unspecified: Secondary | ICD-10-CM | POA: Diagnosis not present

## 2018-09-09 DIAGNOSIS — N2581 Secondary hyperparathyroidism of renal origin: Secondary | ICD-10-CM | POA: Diagnosis not present

## 2018-09-09 DIAGNOSIS — E1129 Type 2 diabetes mellitus with other diabetic kidney complication: Secondary | ICD-10-CM | POA: Diagnosis not present

## 2018-09-09 DIAGNOSIS — N186 End stage renal disease: Secondary | ICD-10-CM | POA: Diagnosis not present

## 2018-09-11 DIAGNOSIS — E1129 Type 2 diabetes mellitus with other diabetic kidney complication: Secondary | ICD-10-CM | POA: Diagnosis not present

## 2018-09-11 DIAGNOSIS — D689 Coagulation defect, unspecified: Secondary | ICD-10-CM | POA: Diagnosis not present

## 2018-09-11 DIAGNOSIS — N186 End stage renal disease: Secondary | ICD-10-CM | POA: Diagnosis not present

## 2018-09-11 DIAGNOSIS — N2581 Secondary hyperparathyroidism of renal origin: Secondary | ICD-10-CM | POA: Diagnosis not present

## 2018-09-12 DIAGNOSIS — Z992 Dependence on renal dialysis: Secondary | ICD-10-CM | POA: Diagnosis not present

## 2018-09-12 DIAGNOSIS — N186 End stage renal disease: Secondary | ICD-10-CM | POA: Diagnosis not present

## 2018-09-12 DIAGNOSIS — E1122 Type 2 diabetes mellitus with diabetic chronic kidney disease: Secondary | ICD-10-CM | POA: Diagnosis not present

## 2018-09-16 DIAGNOSIS — N2581 Secondary hyperparathyroidism of renal origin: Secondary | ICD-10-CM | POA: Diagnosis not present

## 2018-09-16 DIAGNOSIS — D689 Coagulation defect, unspecified: Secondary | ICD-10-CM | POA: Diagnosis not present

## 2018-09-16 DIAGNOSIS — E1129 Type 2 diabetes mellitus with other diabetic kidney complication: Secondary | ICD-10-CM | POA: Diagnosis not present

## 2018-09-16 DIAGNOSIS — N186 End stage renal disease: Secondary | ICD-10-CM | POA: Diagnosis not present

## 2018-09-18 DIAGNOSIS — D689 Coagulation defect, unspecified: Secondary | ICD-10-CM | POA: Diagnosis not present

## 2018-09-18 DIAGNOSIS — N186 End stage renal disease: Secondary | ICD-10-CM | POA: Diagnosis not present

## 2018-09-18 DIAGNOSIS — N2581 Secondary hyperparathyroidism of renal origin: Secondary | ICD-10-CM | POA: Diagnosis not present

## 2018-09-18 DIAGNOSIS — E1129 Type 2 diabetes mellitus with other diabetic kidney complication: Secondary | ICD-10-CM | POA: Diagnosis not present

## 2018-09-19 DIAGNOSIS — D689 Coagulation defect, unspecified: Secondary | ICD-10-CM | POA: Diagnosis not present

## 2018-09-19 DIAGNOSIS — N2581 Secondary hyperparathyroidism of renal origin: Secondary | ICD-10-CM | POA: Diagnosis not present

## 2018-09-19 DIAGNOSIS — N186 End stage renal disease: Secondary | ICD-10-CM | POA: Diagnosis not present

## 2018-09-19 DIAGNOSIS — E1129 Type 2 diabetes mellitus with other diabetic kidney complication: Secondary | ICD-10-CM | POA: Diagnosis not present

## 2018-09-21 DIAGNOSIS — N2581 Secondary hyperparathyroidism of renal origin: Secondary | ICD-10-CM | POA: Diagnosis not present

## 2018-09-21 DIAGNOSIS — N186 End stage renal disease: Secondary | ICD-10-CM | POA: Diagnosis not present

## 2018-09-21 DIAGNOSIS — E1129 Type 2 diabetes mellitus with other diabetic kidney complication: Secondary | ICD-10-CM | POA: Diagnosis not present

## 2018-09-21 DIAGNOSIS — D689 Coagulation defect, unspecified: Secondary | ICD-10-CM | POA: Diagnosis not present

## 2018-09-23 DIAGNOSIS — D689 Coagulation defect, unspecified: Secondary | ICD-10-CM | POA: Diagnosis not present

## 2018-09-23 DIAGNOSIS — N186 End stage renal disease: Secondary | ICD-10-CM | POA: Diagnosis not present

## 2018-09-23 DIAGNOSIS — N2581 Secondary hyperparathyroidism of renal origin: Secondary | ICD-10-CM | POA: Diagnosis not present

## 2018-09-23 DIAGNOSIS — E1129 Type 2 diabetes mellitus with other diabetic kidney complication: Secondary | ICD-10-CM | POA: Diagnosis not present

## 2018-09-28 DIAGNOSIS — E1129 Type 2 diabetes mellitus with other diabetic kidney complication: Secondary | ICD-10-CM | POA: Diagnosis not present

## 2018-09-28 DIAGNOSIS — N2581 Secondary hyperparathyroidism of renal origin: Secondary | ICD-10-CM | POA: Diagnosis not present

## 2018-09-28 DIAGNOSIS — N186 End stage renal disease: Secondary | ICD-10-CM | POA: Diagnosis not present

## 2018-09-28 DIAGNOSIS — D689 Coagulation defect, unspecified: Secondary | ICD-10-CM | POA: Diagnosis not present

## 2018-09-30 DIAGNOSIS — N2581 Secondary hyperparathyroidism of renal origin: Secondary | ICD-10-CM | POA: Diagnosis not present

## 2018-09-30 DIAGNOSIS — E1129 Type 2 diabetes mellitus with other diabetic kidney complication: Secondary | ICD-10-CM | POA: Diagnosis not present

## 2018-09-30 DIAGNOSIS — D689 Coagulation defect, unspecified: Secondary | ICD-10-CM | POA: Diagnosis not present

## 2018-09-30 DIAGNOSIS — N186 End stage renal disease: Secondary | ICD-10-CM | POA: Diagnosis not present

## 2018-10-03 DIAGNOSIS — N186 End stage renal disease: Secondary | ICD-10-CM | POA: Diagnosis not present

## 2018-10-03 DIAGNOSIS — N2581 Secondary hyperparathyroidism of renal origin: Secondary | ICD-10-CM | POA: Diagnosis not present

## 2018-10-03 DIAGNOSIS — D689 Coagulation defect, unspecified: Secondary | ICD-10-CM | POA: Diagnosis not present

## 2018-10-03 DIAGNOSIS — E1129 Type 2 diabetes mellitus with other diabetic kidney complication: Secondary | ICD-10-CM | POA: Diagnosis not present

## 2018-10-05 DIAGNOSIS — E1129 Type 2 diabetes mellitus with other diabetic kidney complication: Secondary | ICD-10-CM | POA: Diagnosis not present

## 2018-10-05 DIAGNOSIS — D689 Coagulation defect, unspecified: Secondary | ICD-10-CM | POA: Diagnosis not present

## 2018-10-05 DIAGNOSIS — N2581 Secondary hyperparathyroidism of renal origin: Secondary | ICD-10-CM | POA: Diagnosis not present

## 2018-10-05 DIAGNOSIS — N186 End stage renal disease: Secondary | ICD-10-CM | POA: Diagnosis not present

## 2018-10-07 DIAGNOSIS — E1129 Type 2 diabetes mellitus with other diabetic kidney complication: Secondary | ICD-10-CM | POA: Diagnosis not present

## 2018-10-07 DIAGNOSIS — N186 End stage renal disease: Secondary | ICD-10-CM | POA: Diagnosis not present

## 2018-10-07 DIAGNOSIS — N2581 Secondary hyperparathyroidism of renal origin: Secondary | ICD-10-CM | POA: Diagnosis not present

## 2018-10-07 DIAGNOSIS — D689 Coagulation defect, unspecified: Secondary | ICD-10-CM | POA: Diagnosis not present

## 2018-10-10 DIAGNOSIS — D689 Coagulation defect, unspecified: Secondary | ICD-10-CM | POA: Diagnosis not present

## 2018-10-10 DIAGNOSIS — N186 End stage renal disease: Secondary | ICD-10-CM | POA: Diagnosis not present

## 2018-10-10 DIAGNOSIS — E1129 Type 2 diabetes mellitus with other diabetic kidney complication: Secondary | ICD-10-CM | POA: Diagnosis not present

## 2018-10-10 DIAGNOSIS — N2581 Secondary hyperparathyroidism of renal origin: Secondary | ICD-10-CM | POA: Diagnosis not present

## 2018-10-12 DIAGNOSIS — N2581 Secondary hyperparathyroidism of renal origin: Secondary | ICD-10-CM | POA: Diagnosis not present

## 2018-10-12 DIAGNOSIS — D689 Coagulation defect, unspecified: Secondary | ICD-10-CM | POA: Diagnosis not present

## 2018-10-12 DIAGNOSIS — N186 End stage renal disease: Secondary | ICD-10-CM | POA: Diagnosis not present

## 2018-10-12 DIAGNOSIS — E1129 Type 2 diabetes mellitus with other diabetic kidney complication: Secondary | ICD-10-CM | POA: Diagnosis not present

## 2018-10-13 DIAGNOSIS — N186 End stage renal disease: Secondary | ICD-10-CM | POA: Diagnosis not present

## 2018-10-13 DIAGNOSIS — E1122 Type 2 diabetes mellitus with diabetic chronic kidney disease: Secondary | ICD-10-CM | POA: Diagnosis not present

## 2018-10-13 DIAGNOSIS — Z992 Dependence on renal dialysis: Secondary | ICD-10-CM | POA: Diagnosis not present

## 2018-10-14 DIAGNOSIS — E1129 Type 2 diabetes mellitus with other diabetic kidney complication: Secondary | ICD-10-CM | POA: Diagnosis not present

## 2018-10-14 DIAGNOSIS — D689 Coagulation defect, unspecified: Secondary | ICD-10-CM | POA: Diagnosis not present

## 2018-10-14 DIAGNOSIS — N186 End stage renal disease: Secondary | ICD-10-CM | POA: Diagnosis not present

## 2018-10-14 DIAGNOSIS — N2581 Secondary hyperparathyroidism of renal origin: Secondary | ICD-10-CM | POA: Diagnosis not present

## 2018-10-17 DIAGNOSIS — E1129 Type 2 diabetes mellitus with other diabetic kidney complication: Secondary | ICD-10-CM | POA: Diagnosis not present

## 2018-10-17 DIAGNOSIS — N186 End stage renal disease: Secondary | ICD-10-CM | POA: Diagnosis not present

## 2018-10-17 DIAGNOSIS — N2581 Secondary hyperparathyroidism of renal origin: Secondary | ICD-10-CM | POA: Diagnosis not present

## 2018-10-17 DIAGNOSIS — D689 Coagulation defect, unspecified: Secondary | ICD-10-CM | POA: Diagnosis not present

## 2018-10-21 DIAGNOSIS — D689 Coagulation defect, unspecified: Secondary | ICD-10-CM | POA: Diagnosis not present

## 2018-10-21 DIAGNOSIS — N2581 Secondary hyperparathyroidism of renal origin: Secondary | ICD-10-CM | POA: Diagnosis not present

## 2018-10-21 DIAGNOSIS — N186 End stage renal disease: Secondary | ICD-10-CM | POA: Diagnosis not present

## 2018-10-21 DIAGNOSIS — E1129 Type 2 diabetes mellitus with other diabetic kidney complication: Secondary | ICD-10-CM | POA: Diagnosis not present

## 2018-10-24 DIAGNOSIS — N186 End stage renal disease: Secondary | ICD-10-CM | POA: Diagnosis not present

## 2018-10-24 DIAGNOSIS — N2581 Secondary hyperparathyroidism of renal origin: Secondary | ICD-10-CM | POA: Diagnosis not present

## 2018-10-24 DIAGNOSIS — E1129 Type 2 diabetes mellitus with other diabetic kidney complication: Secondary | ICD-10-CM | POA: Diagnosis not present

## 2018-10-24 DIAGNOSIS — D689 Coagulation defect, unspecified: Secondary | ICD-10-CM | POA: Diagnosis not present

## 2018-10-25 DIAGNOSIS — N186 End stage renal disease: Secondary | ICD-10-CM | POA: Diagnosis not present

## 2018-10-25 DIAGNOSIS — D689 Coagulation defect, unspecified: Secondary | ICD-10-CM | POA: Diagnosis not present

## 2018-10-25 DIAGNOSIS — E1129 Type 2 diabetes mellitus with other diabetic kidney complication: Secondary | ICD-10-CM | POA: Diagnosis not present

## 2018-10-25 DIAGNOSIS — N2581 Secondary hyperparathyroidism of renal origin: Secondary | ICD-10-CM | POA: Diagnosis not present

## 2018-10-26 DIAGNOSIS — E1129 Type 2 diabetes mellitus with other diabetic kidney complication: Secondary | ICD-10-CM | POA: Diagnosis not present

## 2018-10-26 DIAGNOSIS — D689 Coagulation defect, unspecified: Secondary | ICD-10-CM | POA: Diagnosis not present

## 2018-10-26 DIAGNOSIS — N186 End stage renal disease: Secondary | ICD-10-CM | POA: Diagnosis not present

## 2018-10-26 DIAGNOSIS — N2581 Secondary hyperparathyroidism of renal origin: Secondary | ICD-10-CM | POA: Diagnosis not present

## 2018-10-28 DIAGNOSIS — D689 Coagulation defect, unspecified: Secondary | ICD-10-CM | POA: Diagnosis not present

## 2018-10-28 DIAGNOSIS — N186 End stage renal disease: Secondary | ICD-10-CM | POA: Diagnosis not present

## 2018-10-28 DIAGNOSIS — E1129 Type 2 diabetes mellitus with other diabetic kidney complication: Secondary | ICD-10-CM | POA: Diagnosis not present

## 2018-10-28 DIAGNOSIS — N2581 Secondary hyperparathyroidism of renal origin: Secondary | ICD-10-CM | POA: Diagnosis not present

## 2018-10-31 DIAGNOSIS — N186 End stage renal disease: Secondary | ICD-10-CM | POA: Diagnosis not present

## 2018-10-31 DIAGNOSIS — D689 Coagulation defect, unspecified: Secondary | ICD-10-CM | POA: Diagnosis not present

## 2018-10-31 DIAGNOSIS — E1129 Type 2 diabetes mellitus with other diabetic kidney complication: Secondary | ICD-10-CM | POA: Diagnosis not present

## 2018-10-31 DIAGNOSIS — N2581 Secondary hyperparathyroidism of renal origin: Secondary | ICD-10-CM | POA: Diagnosis not present

## 2018-11-02 DIAGNOSIS — N2581 Secondary hyperparathyroidism of renal origin: Secondary | ICD-10-CM | POA: Diagnosis not present

## 2018-11-02 DIAGNOSIS — N186 End stage renal disease: Secondary | ICD-10-CM | POA: Diagnosis not present

## 2018-11-02 DIAGNOSIS — D689 Coagulation defect, unspecified: Secondary | ICD-10-CM | POA: Diagnosis not present

## 2018-11-02 DIAGNOSIS — E1129 Type 2 diabetes mellitus with other diabetic kidney complication: Secondary | ICD-10-CM | POA: Diagnosis not present

## 2018-11-09 DIAGNOSIS — N2581 Secondary hyperparathyroidism of renal origin: Secondary | ICD-10-CM | POA: Diagnosis not present

## 2018-11-09 DIAGNOSIS — D689 Coagulation defect, unspecified: Secondary | ICD-10-CM | POA: Diagnosis not present

## 2018-11-09 DIAGNOSIS — E1129 Type 2 diabetes mellitus with other diabetic kidney complication: Secondary | ICD-10-CM | POA: Diagnosis not present

## 2018-11-09 DIAGNOSIS — N186 End stage renal disease: Secondary | ICD-10-CM | POA: Diagnosis not present

## 2018-11-11 DIAGNOSIS — E1122 Type 2 diabetes mellitus with diabetic chronic kidney disease: Secondary | ICD-10-CM | POA: Diagnosis not present

## 2018-11-11 DIAGNOSIS — Z992 Dependence on renal dialysis: Secondary | ICD-10-CM | POA: Diagnosis not present

## 2018-11-11 DIAGNOSIS — N2581 Secondary hyperparathyroidism of renal origin: Secondary | ICD-10-CM | POA: Diagnosis not present

## 2018-11-11 DIAGNOSIS — E1129 Type 2 diabetes mellitus with other diabetic kidney complication: Secondary | ICD-10-CM | POA: Diagnosis not present

## 2018-11-11 DIAGNOSIS — N186 End stage renal disease: Secondary | ICD-10-CM | POA: Diagnosis not present

## 2018-11-11 DIAGNOSIS — D689 Coagulation defect, unspecified: Secondary | ICD-10-CM | POA: Diagnosis not present

## 2018-11-14 DIAGNOSIS — N2581 Secondary hyperparathyroidism of renal origin: Secondary | ICD-10-CM | POA: Diagnosis not present

## 2018-11-14 DIAGNOSIS — D689 Coagulation defect, unspecified: Secondary | ICD-10-CM | POA: Diagnosis not present

## 2018-11-14 DIAGNOSIS — N186 End stage renal disease: Secondary | ICD-10-CM | POA: Diagnosis not present

## 2018-11-16 DIAGNOSIS — N186 End stage renal disease: Secondary | ICD-10-CM | POA: Diagnosis not present

## 2018-11-16 DIAGNOSIS — D689 Coagulation defect, unspecified: Secondary | ICD-10-CM | POA: Diagnosis not present

## 2018-11-16 DIAGNOSIS — N2581 Secondary hyperparathyroidism of renal origin: Secondary | ICD-10-CM | POA: Diagnosis not present

## 2018-11-18 DIAGNOSIS — N2581 Secondary hyperparathyroidism of renal origin: Secondary | ICD-10-CM | POA: Diagnosis not present

## 2018-11-18 DIAGNOSIS — N186 End stage renal disease: Secondary | ICD-10-CM | POA: Diagnosis not present

## 2018-11-18 DIAGNOSIS — D689 Coagulation defect, unspecified: Secondary | ICD-10-CM | POA: Diagnosis not present

## 2018-11-23 DIAGNOSIS — N186 End stage renal disease: Secondary | ICD-10-CM | POA: Diagnosis not present

## 2018-11-23 DIAGNOSIS — D689 Coagulation defect, unspecified: Secondary | ICD-10-CM | POA: Diagnosis not present

## 2018-11-23 DIAGNOSIS — N2581 Secondary hyperparathyroidism of renal origin: Secondary | ICD-10-CM | POA: Diagnosis not present

## 2018-11-25 DIAGNOSIS — N2581 Secondary hyperparathyroidism of renal origin: Secondary | ICD-10-CM | POA: Diagnosis not present

## 2018-11-25 DIAGNOSIS — D689 Coagulation defect, unspecified: Secondary | ICD-10-CM | POA: Diagnosis not present

## 2018-11-25 DIAGNOSIS — N186 End stage renal disease: Secondary | ICD-10-CM | POA: Diagnosis not present

## 2018-11-28 DIAGNOSIS — N2581 Secondary hyperparathyroidism of renal origin: Secondary | ICD-10-CM | POA: Diagnosis not present

## 2018-11-28 DIAGNOSIS — N186 End stage renal disease: Secondary | ICD-10-CM | POA: Diagnosis not present

## 2018-11-28 DIAGNOSIS — D689 Coagulation defect, unspecified: Secondary | ICD-10-CM | POA: Diagnosis not present

## 2018-11-30 DIAGNOSIS — N186 End stage renal disease: Secondary | ICD-10-CM | POA: Diagnosis not present

## 2018-11-30 DIAGNOSIS — D689 Coagulation defect, unspecified: Secondary | ICD-10-CM | POA: Diagnosis not present

## 2018-11-30 DIAGNOSIS — N2581 Secondary hyperparathyroidism of renal origin: Secondary | ICD-10-CM | POA: Diagnosis not present

## 2018-12-07 DIAGNOSIS — E7800 Pure hypercholesterolemia, unspecified: Secondary | ICD-10-CM | POA: Diagnosis not present

## 2018-12-07 DIAGNOSIS — Z0000 Encounter for general adult medical examination without abnormal findings: Secondary | ICD-10-CM | POA: Diagnosis not present

## 2018-12-07 DIAGNOSIS — E1165 Type 2 diabetes mellitus with hyperglycemia: Secondary | ICD-10-CM | POA: Diagnosis not present

## 2018-12-07 DIAGNOSIS — N186 End stage renal disease: Secondary | ICD-10-CM | POA: Diagnosis not present

## 2018-12-07 DIAGNOSIS — E11311 Type 2 diabetes mellitus with unspecified diabetic retinopathy with macular edema: Secondary | ICD-10-CM | POA: Diagnosis not present

## 2018-12-07 DIAGNOSIS — E1042 Type 1 diabetes mellitus with diabetic polyneuropathy: Secondary | ICD-10-CM | POA: Diagnosis not present

## 2018-12-08 ENCOUNTER — Other Ambulatory Visit: Payer: Self-pay

## 2018-12-08 ENCOUNTER — Emergency Department (HOSPITAL_COMMUNITY): Payer: Medicare Other

## 2018-12-08 ENCOUNTER — Encounter (HOSPITAL_COMMUNITY): Payer: Self-pay

## 2018-12-08 ENCOUNTER — Inpatient Hospital Stay (HOSPITAL_COMMUNITY)
Admission: EM | Admit: 2018-12-08 | Discharge: 2018-12-13 | DRG: 189 | Disposition: A | Discharge status: Home / Self Care | Payer: Medicare Other | Attending: Internal Medicine | Admitting: Internal Medicine

## 2018-12-08 DIAGNOSIS — Z9115 Patient's noncompliance with renal dialysis: Secondary | ICD-10-CM | POA: Diagnosis not present

## 2018-12-08 DIAGNOSIS — D649 Anemia, unspecified: Secondary | ICD-10-CM | POA: Diagnosis not present

## 2018-12-08 DIAGNOSIS — E875 Hyperkalemia: Secondary | ICD-10-CM | POA: Diagnosis not present

## 2018-12-08 DIAGNOSIS — Z79899 Other long term (current) drug therapy: Secondary | ICD-10-CM

## 2018-12-08 DIAGNOSIS — Z794 Long term (current) use of insulin: Secondary | ICD-10-CM

## 2018-12-08 DIAGNOSIS — Z7982 Long term (current) use of aspirin: Secondary | ICD-10-CM

## 2018-12-08 DIAGNOSIS — E8779 Other fluid overload: Secondary | ICD-10-CM

## 2018-12-08 DIAGNOSIS — E1122 Type 2 diabetes mellitus with diabetic chronic kidney disease: Secondary | ICD-10-CM | POA: Diagnosis not present

## 2018-12-08 DIAGNOSIS — K219 Gastro-esophageal reflux disease without esophagitis: Secondary | ICD-10-CM | POA: Diagnosis not present

## 2018-12-08 DIAGNOSIS — R0602 Shortness of breath: Secondary | ICD-10-CM | POA: Diagnosis not present

## 2018-12-08 DIAGNOSIS — E119 Type 2 diabetes mellitus without complications: Secondary | ICD-10-CM | POA: Diagnosis present

## 2018-12-08 DIAGNOSIS — I951 Orthostatic hypotension: Secondary | ICD-10-CM | POA: Diagnosis not present

## 2018-12-08 DIAGNOSIS — D696 Thrombocytopenia, unspecified: Secondary | ICD-10-CM | POA: Diagnosis present

## 2018-12-08 DIAGNOSIS — W19XXXA Unspecified fall, initial encounter: Secondary | ICD-10-CM | POA: Diagnosis not present

## 2018-12-08 DIAGNOSIS — I120 Hypertensive chronic kidney disease with stage 5 chronic kidney disease or end stage renal disease: Secondary | ICD-10-CM | POA: Diagnosis present

## 2018-12-08 DIAGNOSIS — R0902 Hypoxemia: Secondary | ICD-10-CM | POA: Diagnosis not present

## 2018-12-08 DIAGNOSIS — J810 Acute pulmonary edema: Secondary | ICD-10-CM | POA: Diagnosis not present

## 2018-12-08 DIAGNOSIS — Z87891 Personal history of nicotine dependence: Secondary | ICD-10-CM

## 2018-12-08 DIAGNOSIS — I10 Essential (primary) hypertension: Secondary | ICD-10-CM | POA: Diagnosis not present

## 2018-12-08 DIAGNOSIS — Z833 Family history of diabetes mellitus: Secondary | ICD-10-CM

## 2018-12-08 DIAGNOSIS — N2581 Secondary hyperparathyroidism of renal origin: Secondary | ICD-10-CM | POA: Diagnosis present

## 2018-12-08 DIAGNOSIS — E785 Hyperlipidemia, unspecified: Secondary | ICD-10-CM | POA: Diagnosis not present

## 2018-12-08 DIAGNOSIS — I959 Hypotension, unspecified: Secondary | ICD-10-CM | POA: Diagnosis not present

## 2018-12-08 DIAGNOSIS — Z8249 Family history of ischemic heart disease and other diseases of the circulatory system: Secondary | ICD-10-CM

## 2018-12-08 DIAGNOSIS — Z992 Dependence on renal dialysis: Secondary | ICD-10-CM | POA: Diagnosis not present

## 2018-12-08 DIAGNOSIS — N186 End stage renal disease: Secondary | ICD-10-CM | POA: Diagnosis present

## 2018-12-08 DIAGNOSIS — Z9119 Patient's noncompliance with other medical treatment and regimen: Secondary | ICD-10-CM | POA: Diagnosis not present

## 2018-12-08 DIAGNOSIS — E8770 Fluid overload, unspecified: Secondary | ICD-10-CM | POA: Diagnosis present

## 2018-12-08 DIAGNOSIS — R531 Weakness: Secondary | ICD-10-CM | POA: Diagnosis not present

## 2018-12-08 DIAGNOSIS — J9601 Acute respiratory failure with hypoxia: Secondary | ICD-10-CM | POA: Diagnosis present

## 2018-12-08 DIAGNOSIS — N185 Chronic kidney disease, stage 5: Secondary | ICD-10-CM

## 2018-12-08 DIAGNOSIS — M898X9 Other specified disorders of bone, unspecified site: Secondary | ICD-10-CM | POA: Diagnosis not present

## 2018-12-08 DIAGNOSIS — R0600 Dyspnea, unspecified: Secondary | ICD-10-CM

## 2018-12-08 DIAGNOSIS — R0609 Other forms of dyspnea: Secondary | ICD-10-CM | POA: Diagnosis not present

## 2018-12-08 DIAGNOSIS — Z89411 Acquired absence of right great toe: Secondary | ICD-10-CM | POA: Diagnosis not present

## 2018-12-08 LAB — CBC WITH DIFFERENTIAL/PLATELET
Abs Immature Granulocytes: 0.04 10*3/uL (ref 0.00–0.07)
BASOS ABS: 0 10*3/uL (ref 0.0–0.1)
Basophils Relative: 0 %
EOS PCT: 1 %
Eosinophils Absolute: 0.1 10*3/uL (ref 0.0–0.5)
HCT: 30 % — ABNORMAL LOW (ref 39.0–52.0)
Hemoglobin: 9.3 g/dL — ABNORMAL LOW (ref 13.0–17.0)
Immature Granulocytes: 0 %
Lymphocytes Relative: 7 %
Lymphs Abs: 0.6 10*3/uL — ABNORMAL LOW (ref 0.7–4.0)
MCH: 28.3 pg (ref 26.0–34.0)
MCHC: 31 g/dL (ref 30.0–36.0)
MCV: 91.2 fL (ref 80.0–100.0)
Monocytes Absolute: 1.1 10*3/uL — ABNORMAL HIGH (ref 0.1–1.0)
Monocytes Relative: 12 %
Neutro Abs: 7.2 10*3/uL (ref 1.7–7.7)
Neutrophils Relative %: 80 %
Platelets: 163 10*3/uL (ref 150–400)
RBC: 3.29 MIL/uL — ABNORMAL LOW (ref 4.22–5.81)
RDW: 13.4 % (ref 11.5–15.5)
WBC: 9 10*3/uL (ref 4.0–10.5)
nRBC: 0 % (ref 0.0–0.2)

## 2018-12-08 LAB — I-STAT TROPONIN, ED: Troponin i, poc: 0.05 ng/mL (ref 0.00–0.08)

## 2018-12-08 LAB — LACTIC ACID, PLASMA: Lactic Acid, Venous: 1.1 mmol/L (ref 0.5–1.9)

## 2018-12-08 LAB — COMPREHENSIVE METABOLIC PANEL
ALBUMIN: 3 g/dL — AB (ref 3.5–5.0)
ALT: 14 U/L (ref 0–44)
AST: 23 U/L (ref 15–41)
Alkaline Phosphatase: 77 U/L (ref 38–126)
Anion gap: 18 — ABNORMAL HIGH (ref 5–15)
BUN: 146 mg/dL — ABNORMAL HIGH (ref 8–23)
CO2: 16 mmol/L — AB (ref 22–32)
Calcium: 8.7 mg/dL — ABNORMAL LOW (ref 8.9–10.3)
Chloride: 106 mmol/L (ref 98–111)
Creatinine, Ser: 18.63 mg/dL — ABNORMAL HIGH (ref 0.61–1.24)
GFR calc Af Amer: 3 mL/min — ABNORMAL LOW (ref >60)
GFR calc non Af Amer: 2 mL/min — ABNORMAL LOW (ref >60)
GLUCOSE: 114 mg/dL — AB (ref 70–99)
Potassium: 7.1 mmol/L (ref 3.5–5.1)
SODIUM: 140 mmol/L (ref 135–145)
Total Bilirubin: 0.7 mg/dL (ref 0.3–1.2)
Total Protein: 7 g/dL (ref 6.5–8.1)

## 2018-12-08 MED ORDER — ALBUTEROL SULFATE HFA 108 (90 BASE) MCG/ACT IN AERS
2.0000 | INHALATION_SPRAY | Freq: Once | RESPIRATORY_TRACT | Status: AC
  Administered 2018-12-08: 2 via RESPIRATORY_TRACT
  Filled 2018-12-08: qty 6.7

## 2018-12-08 MED ORDER — HEPARIN SODIUM (PORCINE) 1000 UNIT/ML IJ SOLN
INTRAMUSCULAR | Status: AC
  Filled 2018-12-08: qty 6

## 2018-12-08 MED ORDER — CALCIUM GLUCONATE-NACL 1-0.675 GM/50ML-% IV SOLN: 1.0000 g | Freq: Once | INTRAVENOUS | Status: DC

## 2018-12-08 MED ORDER — DEXTROSE 50 % IV SOLN: 1.0000 | Freq: Once | INTRAVENOUS | Status: DC

## 2018-12-08 MED ORDER — INSULIN ASPART 100 UNIT/ML IV SOLN: 5.0000 [IU] | Freq: Once | INTRAVENOUS | Status: DC

## 2018-12-08 MED ORDER — HEPARIN SODIUM (PORCINE) 1000 UNIT/ML DIALYSIS
6000.0000 [IU] | Freq: Once | INTRAMUSCULAR | Status: AC
  Administered 2018-12-08: 6000 [IU] via INTRAVENOUS_CENTRAL
  Filled 2018-12-08: qty 6

## 2018-12-08 MED ORDER — CHLORHEXIDINE GLUCONATE CLOTH 2 % EX PADS
6.0000 | MEDICATED_PAD | Freq: Every day | CUTANEOUS | Status: DC
  Administered 2018-12-10: 6 via TOPICAL

## 2018-12-08 NOTE — H&P (Signed)
[facility]                                                                                    Patient Demographics  Blake [name], is a 73 y.o. male  CSN: [id number]  MRN: [id number]  DOB - Jan 30, 1946  Admit Date - 12/08/2018  Outpatient Primary MD for the patient is [name], MD   With History of -  Past Medical History:  Diagnosis Date  . Acute osteomyelitis of toe of right foot ([place]) 08/02/2015  . Arthritis    "maybe RUE/hand" (02/08/2018)  . ESRD (end stage renal disease) on dialysis [facility])    "TTS; [place]" (02/08/2018)  . Hyperlipidemia   . Hypertension   . Type II diabetes mellitus ([place])       Past Surgical History:  Procedure Laterality Date  . AMPUTATION TOE Right 08/04/2015   Procedure: RIGHT GREAT TOE AMPUTATION;  Surgeon: [name], MD;  Location: [place];  Service: Orthopedics;  Laterality: Right;  . AV FISTULA PLACEMENT Right 07/05/2014   Procedure: ARTERIOVENOUS BRACHIALCEPHALIC (AV) FISTULA CREATION;  Surgeon: Conrad Middleton, MD;  Location: [place];  Service: Vascular;  Laterality: Right;  . BACK SURGERY    . ESOPHAGOGASTRODUODENOSCOPY (EGD) WITH PROPOFOL N/A 11/28/2015   Procedure: ESOPHAGOGASTRODUODENOSCOPY (EGD) WITH PROPOFOL;  Surgeon: [name], MD;  Location: [facility];  Service: Endoscopy;  Laterality: N/A;  . INSERTION OF DIALYSIS CATHETER Left 07/05/2014   Procedure: INSERTION OF DIALYSIS CATHETER-LEFT INTERNAL JUGULAR PLACEMENT;  Surgeon: Conrad Douglassville, MD;  Location: [place];  Service: Vascular;  Laterality: Left;  . LUMBAR DISC SURGERY    . REMOVAL OF A DIALYSIS CATHETER Right 07/05/2014   Procedure: REMOVAL OF A DIALYSIS CATHETER;  Surgeon: Conrad , MD;  Location: [place];  Service: Vascular;  Laterality: Right;    in for   Chief Complaint  Patient presents with  . Weakness  . Shortness of Breath     HPI  Blake [name]  is a 73 y.o. male, with past medical history significant for ESRD,  hyperlipidemia hypertension and diabetes mellitus type 2 presenting today with worsening weakness and shortness of breath after missing 3 dialysis sessions.  Patient denies any chest pains, fever, or chills.  Denies any nausea vomiting or diarrhea.  He missed his dialysis because he could not get his medications straight.  Nephrology was consulted. Work-up in the emergency room showed interstitial edema by chest x-ray and blood work showed worsening uremia with normal WBC.    Review of Systems    In addition to the HPI above,  No Fever-chills, No Headache, No changes with Vision or hearing, No problems swallowing food or Liquids, No Abdominal pain, No Nausea or Vommitting, Bowel movements are regular, No Blood in stool or Urine, No dysuria, No new skin rashes or bruises, No new joints pains-aches,  No new weakness, tingling, numbness in any extremity, No recent weight gain or loss, No polyuria, polydypsia or polyphagia, No significant Mental Stressors.  A full 10 point Review of Systems was done, except as stated above, all other Review of Systems were negative.   Social History Social History  Tobacco Use  . Smoking status: Former Smoker    Packs/day: 0.50    Years: 5.00    Pack years: 2.50    Types: Cigarettes    Last attempt to quit: 08/24/1979    Years since quitting: 39.3  . Smokeless tobacco: Never Used  Substance Use Topics  . Alcohol use: Not Currently    Alcohol/week: 0.0 standard drinks    Comment: 02/08/2018 "stopped in my 48's"     Family History Family History  Problem Relation Age of Onset  . Diabetes Father   . Heart attack Father      Prior to Admission medications   Medication Sig Start Date End Date Taking? Authorizing Provider  aspirin EC 81 MG tablet Take 81 mg by mouth daily.    [provider]  atorvastatin (LIPITOR) 20 MG tablet Take 20 mg by mouth daily.    [provider]  B Complex-C-Folic Acid (NEPHRO VITAMINS) 0.8 MG  TABS Take 0.8 mg by mouth. Dialysis Tuesday, Thursday and Saturday    [provider]  calcium acetate (PHOSLO) 667 MG capsule Take 667 mg by mouth 3 (three) times daily with meals. 02/14/18   [provider]  insulin regular (NOVOLIN R RELION) 100 units/mL injection Inject 0-0.09 mLs (0-9 Units total) into the skin 3 (three) times daily with meals. CBG < 70: implement hypoglycemia protocol CBG 70 - 120: 0 units CBG 121 - 150: 1 unit CBG 151 - 200: 2 units CBG 201 - 250: 3 units CBG 251 - 300: 5 units CBG 301 - 350: 7 units CBG 351 - 400: 9 units CBG > 400: call MD. 02/13/18   [name], MD  Multiple Vitamin (MULTIVITAMIN) tablet Take 1 tablet by mouth daily. 02/14/18   [provider]  Nutritional Supplements (NUTRITIONAL SUPPLEMENT PO) Liberalized renal Diet - Regular texture, Regular / Thin consistency    [provider]  pantoprazole (PROTONIX) 20 MG tablet Take 20 mg by mouth daily.    [provider]  PARoxetine (PAXIL) 10 MG tablet Take 10 mg by mouth daily.    [provider]  ranitidine (ZANTAC) 150 MG tablet Take 150 mg by mouth daily.     [provider]  sevelamer carbonate (RENVELA) 800 MG tablet Take 1 tablet (800 mg total) by mouth 3 (three) times daily with meals. 08/23/14   Conrad George West, MD    No Known Allergies  Physical Exam  Vitals  Blood pressure (!) 128/49, pulse 73, temperature 99.4 F (37.4 C), temperature source Oral, resp. rate 14, height 5\' 8"  (1.727 m), weight 92.5 kg, SpO2 100 %.   1. General chronically ill, looks tired  2. Normal affect and insight, Not Suicidal or Homicidal, Awake Alert, Oriented X 3.  3. No F.N deficits, ALL C.Nerves Intact, Strength 5/5 all 4 extremities, Sensation intact all 4 extremities, Plantars down going.  4. Ears and Eyes appear Normal, Conjunctivae clear, PERRLA. Moist Oral Mucosa.  5. Supple Neck, No JVD, No cervical lymphadenopathy appriciated, No Carotid  Bruits.  6. Symmetrical Chest wall movement, decreased breath sounds at the bases.  7. RRR, No Gallops, Rubs or Murmurs, No Parasternal Heave.  8. Positive Bowel Sounds, Abdomen Soft, Non tender, No organomegaly appriciated,No rebound -guarding or rigidity.  9.  No Cyanosis, Normal Skin Turgor, No Skin Rash or Bruise.  10. Good muscle tone,  joints appear normal , no effusions, Normal ROM.    Data Review  CBC Recent Labs  Lab  12/08/18 1945  WBC 9.0  HGB 9.3*  HCT 30.0*  PLT 163  MCV 91.2  MCH 28.3  MCHC 31.0  RDW 13.4  LYMPHSABS 0.6*  MONOABS 1.1*  EOSABS 0.1  BASOSABS 0.0   ------------------------------------------------------------------------------------------------------------------  Chemistries  Recent Labs  Lab 12/08/18 1945  NA 140  K 7.1*  CL 106  CO2 16*  GLUCOSE 114*  BUN 146*  CREATININE 18.63*  CALCIUM 8.7*  AST 23  ALT 14  ALKPHOS 77  BILITOT 0.7   ------------------------------------------------------------------------------------------------------------------ estimated creatinine clearance is 4 mL/min (A) (by C-G formula based on SCr of 18.63 mg/dL (H)). ------------------------------------------------------------------------------------------------------------------ No results for input(s): TSH, T4TOTAL, T3FREE, THYROIDAB in the last 72 hours.  Invalid input(s): FREET3   Coagulation profile No results for input(s): INR, PROTIME in the last 168 hours. ------------------------------------------------------------------------------------------------------------------- No results for input(s): DDIMER in the last 72 hours. -------------------------------------------------------------------------------------------------------------------  Cardiac Enzymes No results for input(s): CKMB, TROPONINI, MYOGLOBIN in the last 168 hours.  Invalid input(s):  CK ------------------------------------------------------------------------------------------------------------------ Invalid input(s): POCBNP   ---------------------------------------------------------------------------------------------------------------  Urinalysis    Component Value Date/Time   COLORURINE YELLOW 08/01/2015 [place] 08/01/2015 0642   LABSPEC 1.013 08/01/2015 0642   PHURINE 8.0 08/01/2015 0642   GLUCOSEU 100 (A) 08/01/2015 0642   HGBUR MODERATE (A) 08/01/2015 0642   BILIRUBINUR NEGATIVE 08/01/2015 [place] 08/01/2015 0642   PROTEINUR 100 (A) 08/01/2015 0642   UROBILINOGEN 0.2 06/28/2014 1417   NITRITE NEGATIVE 08/01/2015 0642   LEUKOCYTESUR NEGATIVE 08/01/2015 0642    ----------------------------------------------------------------------------------------------------------------   Imaging results:   Dg Chest Portable 1 View  Result Date: 12/08/2018 CLINICAL DATA:  Shortness of breath. Hypoxia. Chills. Missed 2 dialysis appointments. Off meds for 2 weeks. History of end-stage renal disease. Crackles on exam. EXAM: PORTABLE CHEST 1 VIEW COMPARISON:  02/07/2018 FINDINGS: Cardiomegaly is similar to prior exam. Diffuse interstitial opacities with slight progression from 2019. Small bilateral pleural effusions and bibasilar opacities. No pneumothorax. Probable calcified granuloma in the right lung. IMPRESSION: 1. Cardiomegaly and small pleural effusions. 2. Diffusely increased interstitial opacities, progressed from 2019 radiograph. Differential considerations include pulmonary edema versus interstitial lung disease. Electronically Signed   By: Keith Rake M.D.   On: 12/08/2018 20:16    My personal review of EKG: Rhythm NSR, 85 bpm with nonspecific T wave changes  Assessment & Plan  Pulmonary edema with acute uremia in a patient with history of ESRD Dialysis pending tonight Nephrology at bedside  ESRD Versus Tuesday  Thursday and Saturday  Hyperkalemia Status post calcium chloride and IV insulin with D50 water/continue with neb treatments. Hemodialysis pending  Diabetes mellitus type 2 ISS  Noncompliance and poor insight into medical problems Counseled at bedside   DVT Prophylaxis Heparin   AM Labs Ordered, also please review Full Orders    Code Status full  Disposition Plan: Home  Time spent in minutes : 42 minutes  Condition GUARDED   @SIGNATURE @

## 2018-12-08 NOTE — ED Provider Notes (Addendum)
[facility] Provider Note   CSN: [id number] Arrival date & time: 12/08/18  1859    History   Chief Complaint Chief Complaint  Patient presents with   Weakness   Shortness of Breath    HPI [name] is a 73 y.o. male.     The history is provided by the patient and medical records. No language interpreter was used.  Weakness  Severity:  Moderate Onset quality:  Gradual Timing:  Constant Progression:  Unchanged Chronicity:  Recurrent Relieved by:  Nothing Worsened by:  Nothing Ineffective treatments:  None tried Associated symptoms: shortness of breath   Associated symptoms: no abdominal pain, no chest pain, no cough, no diarrhea, no dysuria, no fever, no frequency, no headaches, no nausea and no vomiting   Shortness of Breath  Severity:  Severe Onset quality:  Gradual Duration:  3 days Timing:  Constant Progression:  Waxing and waning Chronicity:  Recurrent Relieved by:  Nothing Worsened by:  Exertion Associated symptoms: no abdominal pain, no chest pain, no cough, no diaphoresis, no fever, no headaches, no neck pain, no rash, no vomiting and no wheezing     Past Medical History:  Diagnosis Date   Acute osteomyelitis of toe of right foot ([name]) 08/02/2015   Arthritis    "maybe RUE/hand" (02/08/2018)   ESRD (end stage renal disease) on dialysis ([name])    "TTS; [place]" (02/08/2018)   Hyperlipidemia    Hypertension    Type II diabetes mellitus ([place])     Patient Active Problem List   Diagnosis Date Noted   Chronic respiratory failure with hypoxia ([name]) 02/24/2018   Dyslipidemia associated with type 2 diabetes mellitus ([name]) 02/14/2018   Type 2 diabetes mellitus with hypertension and end stage renal disease on dialysis ([name]) 02/14/2018   GERD without esophagitis 02/14/2018   Hypomagnesemia 02/08/2018   Thrombocytopenia ([place]) 02/07/2018   Sepsis ([place]) 02/07/2018   Hypocalcemia 02/07/2018    Hypokalemia 02/07/2018   Nausea and vomiting 04/21/2016   Encephalopathy, metabolic 57/84/6962   Acute pulmonary edema ([name])    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 ([name]) 07/07/2014   Depression 07/07/2014   Neuropathic pain, leg, bilateral 07/07/2014   Diabetic retinopathy ([name]) 07/07/2014   Acute respiratory failure ([name]) 06/28/2014   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 ([name]) 10/17/2012   Hypoxia 10/17/2012   Hypertension     Past Surgical History:  Procedure Laterality Date   AMPUTATION TOE Right 08/04/2015   Procedure: RIGHT GREAT TOE AMPUTATION;  Surgeon: [facility] Killings, MD;  Location: [facility];  Service: Orthopedics;  Laterality: Right;   AV FISTULA PLACEMENT Right 07/05/2014   Procedure: ARTERIOVENOUS BRACHIALCEPHALIC (AV) FISTULA CREATION;  Surgeon: [facility] Derby, MD;  Location: [facility];  Service: Vascular;  Laterality: Right;   BACK SURGERY     ESOPHAGOGASTRODUODENOSCOPY (EGD) WITH PROPOFOL N/A 3/[name]/2017   Procedure: ESOPHAGOGASTRODUODENOSCOPY (EGD) WITH PROPOFOL;  Surgeon: [facility] Banister, MD;  Location: [facility];  Service: Endoscopy;  Laterality: N/A;   INSERTION OF DIALYSIS CATHETER Left 07/05/2014   Procedure: INSERTION OF DIALYSIS CATHETER-LEFT INTERNAL JUGULAR PLACEMENT;  Surgeon: [facility] Woodworth, MD;  [facility]: [name];  Service: Vascular;  Laterality: Left;   LUMBAR DISC SURGERY     REMOVAL OF A DIALYSIS CATHETER Right 07/05/2014   Procedure: REMOVAL OF A DIALYSIS CATHETER;  Surgeon: [facility] , MD;  [facility]: [name];  Service: Vascular;  Laterality: Right;  Home Medications    Prior to Admission medications   Medication Sig Start Date End Date Taking? Authorizing Provider  aspirin EC 81 MG tablet Take 81 mg by mouth daily.    [provider]  atorvastatin (LIPITOR) 20 MG tablet Take 20 mg by mouth daily.    [provider]  B Complex-C-Folic Acid (NEPHRO VITAMINS) 0.8 MG TABS Take 0.8 mg by mouth.  Dialysis Tuesday, Thursday and Saturday    [provider]  calcium acetate (PHOSLO) 667 MG capsule Take 667 mg by mouth 3 (three) times daily with meals. 02/14/18   [provider]  insulin regular (NOVOLIN R RELION) 100 units/mL injection Inject 0-0.09 mLs (0-9 Units total) into the skin 3 (three) times daily with meals. CBG < 70: implement hypoglycemia protocol CBG 70 - 120: 0 units CBG 121 - 150: 1 unit CBG 151 - 200: 2 units CBG 201 - 250: 3 units CBG 251 - 300: 5 units CBG 301 - 350: 7 units CBG 351 - 400: 9 units CBG > 400: call MD. 02/13/18   [facility] Jansky, MD  Multiple Vitamin (MULTIVITAMIN) tablet Take 1 tablet by mouth daily. 02/14/18   [provider]  Nutritional Supplements (NUTRITIONAL SUPPLEMENT PO) Liberalized renal Diet - Regular texture, Regular / Thin consistency    [provider]  pantoprazole (PROTONIX) 20 MG tablet Take 20 mg by mouth daily.    [provider]  PARoxetine (PAXIL) 10 MG tablet Take 10 mg by mouth daily.    [provider]  ranitidine (ZANTAC) 150 MG tablet Take 150 mg by mouth daily.     [provider]  sevelamer carbonate (RENVELA) 800 MG tablet Take 1 tablet (800 mg total) by mouth 3 (three) times daily with meals. 08/23/14   [facility] Magalia, MD    Family History Family History  Problem Relation Age of Onset   Diabetes Father    Heart attack Father     Social History Social History   Tobacco Use   Smoking status: Former Smoker    Packs/day: 0.50    Years: 5.00    Pack years: 2.50    Types: Cigarettes    Last attempt to quit: 08/24/1979    Years since quitting: 39.3   Smokeless tobacco: Never Used  Substance Use Topics   Alcohol use: Not Currently    Alcohol/week: 0.0 standard drinks    Comment: 02/08/2018 "stopped in my 22's"   Drug use: Never     Allergies   Patient has no known allergies.   Review of Systems Review of Systems  Constitutional: Positive for  chills and fatigue. Negative for diaphoresis and fever.  HENT: Negative for congestion.   Eyes: Negative for visual disturbance.  Respiratory: Positive for shortness of breath. Negative for cough, chest tightness, wheezing and stridor.   Cardiovascular: Negative for chest pain, palpitations and leg swelling.  Gastrointestinal: Negative for abdominal pain, constipation, diarrhea, nausea and vomiting.  Genitourinary: Negative for dysuria, flank pain and frequency.  Musculoskeletal: Negative for back pain, neck pain and neck stiffness.  Skin: Negative for rash and wound.  Neurological: Positive for weakness. Negative for light-headedness and headaches.  Psychiatric/Behavioral: Negative for agitation.  All other systems reviewed and are negative.    Physical Exam Updated Vital Signs BP (!) 123/59    Pulse 77    Temp 99.4 F (37.4 C) (Oral)    Resp [name]    Ht 5\' 8"  (1.727 m)    Wt  92.5 kg    SpO2 100%    BMI 31.02 kg/m   Physical Exam Vitals signs and nursing note reviewed.  Constitutional:      General: He is not in acute distress.    Appearance: He is well-developed. He is not ill-appearing, toxic-appearing or diaphoretic.  HENT:     Head: Normocephalic and atraumatic.  Eyes:     Extraocular Movements: Extraocular movements intact.     Conjunctiva/sclera: Conjunctivae normal.     Pupils: Pupils are equal, round, and reactive to light.  Neck:     Musculoskeletal: Neck supple.  Cardiovascular:     Rate and Rhythm: Normal rate and regular rhythm.     Heart sounds: No murmur.  Pulmonary:     Effort: Pulmonary effort is normal. Tachypnea present. No respiratory distress.     Breath sounds: Examination of the right-middle field reveals rales. Examination of the left-middle field reveals rales. Examination of the right-lower field reveals rales. Examination of the left-lower field reveals rales. Rales present. No decreased breath sounds, wheezing or rhonchi.  Chest:     Chest wall: No  tenderness.  Abdominal:     Palpations: Abdomen is soft.     Tenderness: There is no abdominal tenderness.  Musculoskeletal:     Right lower leg: He exhibits no tenderness. No edema.     Left lower leg: He exhibits no tenderness. No edema.  Skin:    General: Skin is warm and dry.     Capillary Refill: Capillary refill takes less than 2 seconds.  Neurological:     Mental Status: He is alert.  Psychiatric:        Mood and Affect: Mood normal.      ED Treatments / Results  Labs (all labs ordered are listed, but only abnormal results are displayed) Labs Reviewed  CBC WITH DIFFERENTIAL/PLATELET - Abnormal; Notable for the following components:      Result Value   RBC 3.29 (*)    Hemoglobin 9.3 (*)    HCT 30.0 (*)    Lymphs Abs 0.6 (*)    Monocytes Absolute 1.1 (*)    All other components within normal limits  COMPREHENSIVE METABOLIC PANEL - Abnormal; Notable for the following components:   Potassium 7.1 (*)    CO2 16 (*)    Glucose, Bld 114 (*)    BUN 146 (*)    Creatinine, Ser 18.63 (*)    Calcium 8.7 (*)    Albumin 3.0 (*)    GFR calc non Af Amer 2 (*)    GFR calc Af Amer 3 (*)    Anion gap 18 (*)    All other components within normal limits  LACTIC ACID, PLASMA  LACTIC ACID, PLASMA  I-STAT TROPONIN, ED     EKG EKG Interpretation  Date/Time:  Friday December 08 2018 19:05:51 EDT Ventricular Rate:  85 PR Interval:    QRS Duration: 86 QT Interval:  360 QTC Calculation: 428 R Axis:   57 Text Interpretation:  Sinus rhythm Abnormal R-wave progression, early transition When comapred to prior, no signifiacnt changes seen.  No sTEMI Confirmed by Antony Blackbird [phone]) on 12/08/2018 7:10:46 PM   Radiology Dg Chest Portable 1 View  Result Date: 12/08/2018 CLINICAL DATA:  Shortness of breath. Hypoxia. Chills. Missed 2 dialysis appointments. Off meds for 2 weeks. History of end-stage renal disease. Crackles on exam. EXAM: PORTABLE CHEST 1 VIEW COMPARISON:  02/07/2018  FINDINGS: Cardiomegaly is similar to prior exam. Diffuse interstitial opacities with  slight progression from 2019. Small bilateral pleural effusions and bibasilar opacities. No pneumothorax. Probable calcified granuloma in the right lung. IMPRESSION: 1. Cardiomegaly and small pleural effusions. 2. Diffusely increased interstitial opacities, progressed from 2019 radiograph. Differential considerations include pulmonary edema versus interstitial lung disease. Electronically Signed   By: Keith Rake M.D.   On: 12/08/2018 20:16    Procedures Procedures (including critical care time)  CRITICAL CARE Performed by: Gwenyth Allegra Fidelia Cathers Total critical care time: 35 minutes Critical care time was exclusive of separately billable procedures and treating other patients. Fluid overload, hypoxia requiring nonrebreather, taken to emergent dialysis. K of 7.1 requiring medications.  Critical care was necessary to treat or prevent imminent or life-threatening deterioration. Critical care was time spent personally by me on the following activities: development of treatment plan with patient and/or surrogate as well as nursing, discussions with consultants, evaluation of patient's response to treatment, examination of patient, obtaining history from patient or surrogate, ordering and performing treatments and interventions, ordering and review of laboratory studies, ordering and review of radiographic studies, pulse oximetry and re-evaluation of patient's condition.   Medications Ordered in ED Medications  calcium gluconate 1 g/ 50 mL sodium chloride IVPB (has no administration in time range)  insulin aspart (novoLOG) injection 5 Units (has no administration in time range)    And  dextrose 50 % solution 50 mL (has no administration in time range)  Chlorhexidine Gluconate Cloth 2 % PADS 6 each (has no administration in time range)  heparin injection 6,000 Units (has no administration in time range)    albuterol (PROVENTIL HFA;VENTOLIN HFA) 108 (90 Base) MCG/ACT inhaler 2 puff (2 puffs Inhalation Given 12/08/18 2142)     Initial Impression / Assessment and Plan / ED Course  I have reviewed the triage vital signs and the nursing notes.  Pertinent labs & imaging results that were available during my care of the patient were reviewed by me and considered in my medical decision making (see chart for details).     JERMOND BURKEMPER was evaluated in Emergency Department on 12/08/2018 for the symptoms described in the history of present illness. He was evaluated in the context of the global COVID-19 pandemic, which necessitated consideration that the patient might be at risk for infection with the SARS-CoV-2 virus that causes COVID-19. Institutional protocols and algorithms that pertain to the evaluation of patients at risk for COVID-19 are in a state of rapid change based on information released by regulatory bodies including the CDC and federal and state organizations. These policies and algorithms were followed during the patient's care in the ED.       SCOUT GUMBS is a 73 y.o. male with a past medical history significant for hypertension, diabetes, ESRD on dialysis TTS, GERD, and hyperlipidemia who presents with shortness of breath, fatigue, and chills.  Patient reports that for the last several days he has been having worsening shortness of breath and fatigue.  He feels tired all over with no focal weakness reported in specific extremities.  He says that he has missed 3 dialysis treatments in the last week and a half.  He reports that his last treatment was the Tuesday of last week.  He missed Thursday, Saturday, and Tuesday because he says that he has not had his home medications filled.  He reports that he did not want to go to dialysis center when he did not have his home medications.  He reports that shortness breath worsened over the last several  days leading to him being hypoxic at  home today.  Patient documentation shows that oxygen saturations were in the mid 80s on room air.  He does not use oxygen at home.  He was placed on nonrebreather and transferred to the emergency department.  He denies any chest pain, palpitations, nausea, vomiting, urinary symptoms or GI symptoms.  He does report some chills but no fevers at home.  He reports unknown sick contacts at his dialysis center.  On exam, patient has crackles in the base of both of his lungs.  It sounds fluid overloaded.  Chest and back are nontender.  No murmur.  Abdomen nontender.  Legs are nonedematous and nontender.  Patient oxygen saturations are 100% on nonrebreather.  This will be escalated.  Clinically I suspect patient has missed dialysis [facility] times and has fluid overload in his lungs.  Patient will have chest x-ray and laboratory testing.  Patient's oral temperature was 99 on arrival, this was checked rectally and was still 99.  He is technically afebrile.  Given his lack of documented fever or cough, have low suspicion for an infectious etiology.  Will obtain screen laboratory testing.  Due to patient's new hypoxia and shortness of breath after missing dialysis, anticipate he will need admission and dialysis.  Anticipate reassessment after work-up.  Patient's laboratory testing began to return.  Patient was found to have potassium of 7.1.  Creatinine had also increased to 18.63 and BUN elevated at 146.  Mild anemia similar to prior and no leukocytosis.  Troponin not elevated initially.  Lactic acid not elevated initially.  Chest x-ray is consistent with effusions and fluid overload.  Nephrology was called who will dialyze patient tonight.  They feel he require admission.  Hospitalist team called for admission for further management.  Patient taken to dialysis without further changes.   Final Clinical Impressions(s) / ED Diagnoses   Final diagnoses:  Hypervolemia, unspecified hypervolemia type  Hypoxia   Shortness of breath  Hyperkalemia     Clinical Impression: 1. Hypervolemia, unspecified hypervolemia type   2. Hypoxia   3. Shortness of breath   4. Hyperkalemia     Disposition: Admit  This note was prepared with assistance of Dragon voice recognition software. Occasional wrong-word or sound-a-like substitutions may have occurred due to the inherent limitations of voice recognition software.      Yalena Colon, Gwenyth Allegra, MD 12/08/18 2333    Shailyn Weyandt, Gwenyth Allegra, MD 12/08/18 [phone]

## 2018-12-08 NOTE — Procedures (Signed)
I was present at this dialysis session. I have reviewed the session itself and made appropriate changes.   Vital signs in last 24 hours:  Temp:  [98.6 F (37 C)-99.4 F (37.4 C)] 98.6 F (37 C) (03/27 2155) Pulse Rate:  [70-93] 79 (03/27 2230) Resp:  [13-23] 20 (03/27 2208) BP: (122-146)/(49-67) 141/67 (03/27 2230) SpO2:  [73 %-100 %] 99 % (03/27 2155) Weight:  [92.5 kg] 92.5 kg (03/27 1912) Weight change:  Filed Weights   12/08/18 1912  Weight: 92.5 kg    Recent Labs  Lab 12/08/18 1945  NA 140  K 7.1*  CL 106  CO2 16*  GLUCOSE 114*  BUN 146*  CREATININE 18.63*  CALCIUM 8.7*    Recent Labs  Lab 12/08/18 1945  WBC 9.0  NEUTROABS 7.2  HGB 9.3*  HCT 30.0*  MCV 91.2  PLT 163    Scheduled Meds: . [START ON 12/09/2018] Chlorhexidine Gluconate Cloth  6 each Topical Q0600  . insulin aspart  5 Units Intravenous Once   And  . dextrose  1 ampule Intravenous Once  . [START ON 12/09/2018] heparin  6,000 Units Dialysis Once in dialysis   Continuous Infusions: . calcium gluconate     PRN Meds:.   Donetta Potts,  MD 12/08/2018, 10:45 PM

## 2018-12-08 NOTE — ED Notes (Signed)
Rectal temp 99. On 5L [place]

## 2018-12-08 NOTE — Consult Note (Signed)
[facility] Renal Consultation Note    Indication for Consultation:  Management of ESRD/hemodialysis; anemia, hypertension/volume and secondary hyperparathyroidism  HPI: Blake [name] is a 73 y.o. male.  Blake [name] has a PMH significant for HTN, DM, medical noncompliance, and ESRD normally TTS at [facility], however he missed his last 3 treatments because "I had to get my medications straightened out. He has a history of skipping dialysis but not this many in a row.  He presented to [facility] ED with worsening SOB and s/p fall.  Admits to being weak.  He was noted to be hypoxic and labs were notable for a potassium of 7.1, CO2 16, BUN 146, Cr 18.63.  CXR revealed cardiomegaly, small pleural effusions, and diffuse interstitial edema.  We were consulted to provide HD for his pulmonary edema and hyperkalemia.  Pt is a poor historian.   Past Medical History:  Diagnosis Date  . Acute osteomyelitis of toe of right foot ([place]) 08/02/2015  . Arthritis    "maybe RUE/hand" (02/08/2018)  . ESRD (end stage renal disease) on dialysis [facility])    "TTS; [place]" (02/08/2018)  . Hyperlipidemia   . Hypertension   . Type II diabetes mellitus ([place])    Past Surgical History:  Procedure Laterality Date  . AMPUTATION TOE Right 08/04/2015   Procedure: RIGHT GREAT TOE AMPUTATION;  Surgeon: [name], MD;  Location: [place];  Service: Orthopedics;  Laterality: Right;  . AV FISTULA PLACEMENT Right 07/05/2014   Procedure: ARTERIOVENOUS BRACHIALCEPHALIC (AV) FISTULA CREATION;  Surgeon: Conrad Concho, MD;  Location: [place];  Service: Vascular;  Laterality: Right;  . BACK SURGERY    . ESOPHAGOGASTRODUODENOSCOPY (EGD) WITH PROPOFOL N/A 11/28/2015   Procedure: ESOPHAGOGASTRODUODENOSCOPY (EGD) WITH PROPOFOL;  Surgeon: [name], MD;  Location: [facility];  Service: Endoscopy;  Laterality: N/A;  . INSERTION OF DIALYSIS CATHETER Left 07/05/2014   Procedure: INSERTION OF DIALYSIS CATHETER-LEFT INTERNAL  JUGULAR PLACEMENT;  Surgeon: Conrad [place], MD;  Location: [place];  Service: Vascular;  Laterality: Left;  . LUMBAR DISC SURGERY    . REMOVAL OF A DIALYSIS CATHETER Right 07/05/2014   Procedure: REMOVAL OF A DIALYSIS CATHETER;  Surgeon: Conrad [place], MD;  Location: [facility] OR;  Service: Vascular;  Laterality: Right;   Family History:   Family History  Problem Relation Age of Onset  . Diabetes Father   . Heart attack Father    Social History:  reports that he quit smoking about 39 years ago. His smoking use included cigarettes. He has a 2.50 pack-year smoking history. He has never used smokeless tobacco. He reports previous alcohol use. He reports that he does not use drugs. No Known Allergies Prior to Admission medications   Medication Sig Start Date End Date Taking? Authorizing Provider  aspirin EC 81 MG tablet Take 81 mg by mouth daily.    [provider]  atorvastatin (LIPITOR) 20 MG tablet Take 20 mg by mouth daily.    [provider]  B Complex-C-Folic Acid (NEPHRO VITAMINS) 0.8 MG TABS Take 0.8 mg by mouth. Dialysis Tuesday, Thursday and Saturday    [provider]  calcium acetate (PHOSLO) 667 MG capsule Take 667 mg by mouth 3 (three) times daily with meals. 02/14/18   [provider]  insulin regular (NOVOLIN R RELION) 100 units/mL injection Inject 0-0.09 mLs (0-9 Units total) into the skin 3 (three) times daily with meals. CBG < 70: implement hypoglycemia protocol CBG 70 - 120: 0 units CBG 121 -  150: 1 unit CBG 151 - 200: 2 units CBG 201 - 250: 3 units CBG 251 - 300: 5 units CBG 301 - 350: 7 units CBG 351 - 400: 9 units CBG > 400: call MD. 02/13/18   [name], MD  Multiple Vitamin (MULTIVITAMIN) tablet Take 1 tablet by mouth daily. 02/14/18   [provider]  Nutritional Supplements (NUTRITIONAL SUPPLEMENT PO) Liberalized renal Diet - Regular texture, Regular / Thin consistency    [provider]  pantoprazole (PROTONIX) 20 MG  tablet Take 20 mg by mouth daily.    [provider]  PARoxetine (PAXIL) 10 MG tablet Take 10 mg by mouth daily.    [provider]  ranitidine (ZANTAC) 150 MG tablet Take 150 mg by mouth daily.     [provider]  sevelamer carbonate (RENVELA) 800 MG tablet Take 1 tablet (800 mg total) by mouth 3 (three) times daily with meals. 08/23/14   Conrad Muscogee, MD   Current Facility-Administered Medications  Medication Dose Route Frequency Provider Last Rate Last Dose  . albuterol (PROVENTIL HFA;VENTOLIN HFA) 108 (90 Base) MCG/ACT inhaler 2 puff  2 puff Inhalation Once Tegeler, Gwenyth Allegra, MD      . calcium gluconate 1 g/ 50 mL sodium chloride IVPB  1 g Intravenous Once Tegeler, Gwenyth Allegra, MD      . Derrill Memo ON 12/09/2018] Chlorhexidine Gluconate Cloth 2 % PADS 6 each  6 each Topical Q0600 Donato Heinz, MD      . insulin aspart (novoLOG) injection 5 Units  5 Units Intravenous Once Tegeler, Gwenyth Allegra, MD       And  . dextrose 50 % solution 50 mL  1 ampule Intravenous Once Tegeler, Gwenyth Allegra, MD       Current Outpatient Medications  Medication Sig Dispense Refill  . aspirin EC 81 MG tablet Take 81 mg by mouth daily.    Marland Kitchen atorvastatin (LIPITOR) 20 MG tablet Take 20 mg by mouth daily.    . B Complex-C-Folic Acid (NEPHRO VITAMINS) 0.8 MG TABS Take 0.8 mg by mouth. Dialysis Tuesday, Thursday and Saturday    . calcium acetate (PHOSLO) 667 MG capsule Take 667 mg by mouth 3 (three) times daily with meals.    . insulin regular (NOVOLIN R RELION) 100 units/mL injection Inject 0-0.09 mLs (0-9 Units total) into the skin 3 (three) times daily with meals. CBG < 70: implement hypoglycemia protocol CBG 70 - 120: 0 units CBG 121 - 150: 1 unit CBG 151 - 200: 2 units CBG 201 - 250: 3 units CBG 251 - 300: 5 units CBG 301 - 350: 7 units CBG 351 - 400: 9 units CBG > 400: call MD.    . Multiple Vitamin (MULTIVITAMIN) tablet Take 1 tablet by mouth daily.    . Nutritional  Supplements (NUTRITIONAL SUPPLEMENT PO) Liberalized renal Diet - Regular texture, Regular / Thin consistency    . pantoprazole (PROTONIX) 20 MG tablet Take 20 mg by mouth daily.    Marland Kitchen PARoxetine (PAXIL) 10 MG tablet Take 10 mg by mouth daily.    . ranitidine (ZANTAC) 150 MG tablet Take 150 mg by mouth daily.     . sevelamer carbonate (RENVELA) 800 MG tablet Take 1 tablet (800 mg total) by mouth 3 (three) times daily with meals. 90 tablet 11   Labs: Basic Metabolic Panel: Recent Labs  Lab 12/08/18 1945  NA 140  K 7.1*  CL 106  CO2 16*  GLUCOSE 114*  BUN  146*  CREATININE 18.63*  CALCIUM 8.7*   Liver Function Tests: Recent Labs  Lab 12/08/18 1945  AST 23  ALT 14  ALKPHOS 77  BILITOT 0.7  PROT 7.0  ALBUMIN 3.0*   No results for input(s): LIPASE, AMYLASE in the last 168 hours. No results for input(s): AMMONIA in the last 168 hours. CBC: Recent Labs  Lab 12/08/18 1945  WBC 9.0  NEUTROABS 7.2  HGB 9.3*  HCT 30.0*  MCV 91.2  PLT 163   Cardiac Enzymes: No results for input(s): CKTOTAL, CKMB, CKMBINDEX, TROPONINI in the last 168 hours. CBG: No results for input(s): GLUCAP in the last 168 hours. Iron Studies: No results for input(s): IRON, TIBC, TRANSFERRIN, FERRITIN in the last 72 hours. Studies/Results: Dg Chest Portable 1 View  Result Date: 12/08/2018 CLINICAL DATA:  Shortness of breath. Hypoxia. Chills. Missed 2 dialysis appointments. Off meds for 2 weeks. History of end-stage renal disease. Crackles on exam. EXAM: PORTABLE CHEST 1 VIEW COMPARISON:  02/07/2018 FINDINGS: Cardiomegaly is similar to prior exam. Diffuse interstitial opacities with slight progression from 2019. Small bilateral pleural effusions and bibasilar opacities. No pneumothorax. Probable calcified granuloma in the right lung. IMPRESSION: 1. Cardiomegaly and small pleural effusions. 2. Diffusely increased interstitial opacities, progressed from 2019 radiograph. Differential considerations include  pulmonary edema versus interstitial lung disease. Electronically Signed   By: Keith Rake M.D.   On: 12/08/2018 20:16    ROS: Pertinent items are noted in HPI. Physical Exam: Vitals:   12/08/18 1911 12/08/18 1912 12/08/18 1915 12/08/18 1945  BP:   (!) 123/59 (!) 128/49  Pulse:   77 73  Resp:   17 14  Temp: 99.4 F (37.4 C)     TempSrc: Oral     SpO2: 100%  100% 100%  Weight:  92.5 kg    Height:  5\' 8"  (1.727 m)        Weight change:  No intake or output data in the 24 hours ending 12/08/18 2121 BP (!) 128/49   Pulse 73   Temp 99.4 F (37.4 C) (Oral)   Resp 14   Ht 5\' 8"  (1.727 m)   Wt 92.5 kg   SpO2 100%   BMI 31.02 kg/m  General appearance: fatigued, no distress, slowed mentation and wearing oxygen via [place] Head: Normocephalic, without obvious abnormality, atraumatic Resp: rales bilaterally Cardio: regular rate and rhythm and no rub GI: soft, non-tender; bowel sounds normal; no masses,  no organomegaly Extremities: extremities normal, atraumatic, no cyanosis or edema and RUE AVF +T/B with aneurysmal changes and a small ulceration over venout portion of avf, no bleeding, erythema, or drainage Dialysis Access:  Dialysis Orders: Center: [facility]  on TTS . EDW 88kg HD Bath 2K/3.5Ca  Time 4 hours Heparin 6000. Access RUE AVF BFR 400 DFR 800     Assessment/Plan: 1.  acute hypoxic respiratory failure- presumably due to noncompliance with HD and volume overload. 2. Life-threatening hyperkalemia- plan for urgent HD with low K bath and follow 3.  ESRD -  Normally TTS.  Plan for HD today and again tomorrow 4.  Hypertension/volume  - markedly volume overloaded and as above plan for serial HD 5. Weakness- presumably due to uremia and hyperkalemia.  Follow with HD.  6. Anemia  - drop from his baseline and has not been on ESA.  Will follow after UF. 7.  Metabolic bone disease -  Cont with home meds, renvela and sensipar 8.  Nutrition - renal diet. 9. Low grade fever-  he denies  cough.  No sick exposures 10. Noncompliance- concern over his ability to care for himself.  Will follow mental status after serial HD.   Donetta Potts, MD [place] Pager [phone] 12/08/2018, 9:21 PM

## 2018-12-08 NOTE — ED Triage Notes (Signed)
[facility] reports pt had fall/trip without injuries no loc, injury, pt weak, has missed 3 dialysis treatments. Pt A&O x4. 85% or lower on RA, placed on NRB sat 100%, denies cough. 140 cbg

## 2018-12-08 NOTE — ED Notes (Signed)
Linna Hoff, Dialysis RN, K+ will be corrected with dialysis, IV meds not needed.

## 2018-12-09 ENCOUNTER — Encounter (HOSPITAL_COMMUNITY): Payer: Self-pay | Admitting: *Deleted

## 2018-12-09 ENCOUNTER — Other Ambulatory Visit: Payer: Self-pay

## 2018-12-09 DIAGNOSIS — Z992 Dependence on renal dialysis: Secondary | ICD-10-CM

## 2018-12-09 DIAGNOSIS — N185 Chronic kidney disease, stage 5: Secondary | ICD-10-CM

## 2018-12-09 DIAGNOSIS — E1122 Type 2 diabetes mellitus with diabetic chronic kidney disease: Secondary | ICD-10-CM

## 2018-12-09 DIAGNOSIS — I120 Hypertensive chronic kidney disease with stage 5 chronic kidney disease or end stage renal disease: Secondary | ICD-10-CM

## 2018-12-09 DIAGNOSIS — J810 Acute pulmonary edema: Principal | ICD-10-CM

## 2018-12-09 LAB — CBC
HCT: 30.4 % — ABNORMAL LOW (ref 39.0–52.0)
Hemoglobin: 9.7 g/dL — ABNORMAL LOW (ref 13.0–17.0)
MCH: 29 pg (ref 26.0–34.0)
MCHC: 31.9 g/dL (ref 30.0–36.0)
MCV: 91 fL (ref 80.0–100.0)
Platelets: 161 K/uL (ref 150–400)
RBC: 3.34 MIL/uL — ABNORMAL LOW (ref 4.22–5.81)
RDW: 13.4 % (ref 11.5–15.5)
WBC: 9.1 K/uL (ref 4.0–10.5)
nRBC: 0 % (ref 0.0–0.2)

## 2018-12-09 LAB — GLUCOSE, CAPILLARY
Glucose-Capillary: 74 mg/dL (ref 70–99)
Glucose-Capillary: 71 mg/dL (ref 70–99)
Glucose-Capillary: 140 mg/dL — ABNORMAL HIGH (ref 70–99)
Glucose-Capillary: 89 mg/dL (ref 70–99)
Glucose-Capillary: 121 mg/dL — ABNORMAL HIGH (ref 70–99)

## 2018-12-09 LAB — MRSA PCR SCREENING: MRSA by PCR: NEGATIVE

## 2018-12-09 LAB — HEMOGLOBIN A1C
Hgb A1c MFr Bld: 6.5 % — ABNORMAL HIGH (ref 4.8–5.6)
Mean Plasma Glucose: 139.85 mg/dL

## 2018-12-09 LAB — LACTIC ACID, PLASMA: Lactic Acid, Venous: 1.5 mmol/L (ref 0.5–1.9)

## 2018-12-09 MED ORDER — FAMOTIDINE 20 MG PO TABS
10.0000 mg | ORAL_TABLET | Freq: Every day | ORAL | Status: DC
  Administered 2018-12-09 – 2018-12-13 (×5): 10 mg via ORAL
  Filled 2018-12-09 (×5): qty 1

## 2018-12-09 MED ORDER — ACETAMINOPHEN 325 MG PO TABS: 650.0000 mg | ORAL_TABLET | Freq: Four times a day (QID) | ORAL | Status: DC | PRN

## 2018-12-09 MED ORDER — ALBUTEROL SULFATE (2.5 MG/3ML) 0.083% IN NEBU
2.5000 mg | INHALATION_SOLUTION | Freq: Four times a day (QID) | RESPIRATORY_TRACT | Status: DC
  Filled 2018-12-09: qty 3

## 2018-12-09 MED ORDER — DARBEPOETIN ALFA 60 MCG/0.3ML IJ SOSY
60.0000 ug | PREFILLED_SYRINGE | INTRAMUSCULAR | Status: DC
  Administered 2018-12-09: 60 ug via INTRAVENOUS
  Filled 2018-12-09: qty 0.3

## 2018-12-09 MED ORDER — SEVELAMER CARBONATE 800 MG PO TABS
800.0000 mg | ORAL_TABLET | Freq: Three times a day (TID) | ORAL | Status: DC
  Administered 2018-12-09 – 2018-12-13 (×13): 800 mg via ORAL
  Filled 2018-12-09 (×13): qty 1

## 2018-12-09 MED ORDER — ACETAMINOPHEN 650 MG RE SUPP: 650.0000 mg | Freq: Four times a day (QID) | RECTAL | Status: DC | PRN

## 2018-12-09 MED ORDER — PANTOPRAZOLE SODIUM 20 MG PO TBEC
20.0000 mg | DELAYED_RELEASE_TABLET | Freq: Every day | ORAL | Status: DC
  Administered 2018-12-09 – 2018-12-11 (×3): 20 mg via ORAL
  Filled 2018-12-09 (×3): qty 1

## 2018-12-09 MED ORDER — ONDANSETRON HCL 4 MG/2ML IJ SOLN
4.0000 mg | Freq: Four times a day (QID) | INTRAMUSCULAR | Status: DC | PRN
  Administered 2018-12-09 – 2018-12-10 (×3): 4 mg via INTRAVENOUS
  Filled 2018-12-09 (×2): qty 2

## 2018-12-09 MED ORDER — SODIUM CHLORIDE 0.9 % IV SOLN: 250.0000 mL | INTRAVENOUS | Status: DC | PRN

## 2018-12-09 MED ORDER — RENA-VITE PO TABS
1.0000 | ORAL_TABLET | ORAL | Status: DC
  Administered 2018-12-09: 1 via ORAL
  Filled 2018-12-09 (×2): qty 1

## 2018-12-09 MED ORDER — INSULIN ASPART 100 UNIT/ML ~~LOC~~ SOLN
0.0000 [IU] | Freq: Three times a day (TID) | SUBCUTANEOUS | Status: DC
  Administered 2018-12-09: 1 [IU] via SUBCUTANEOUS
  Administered 2018-12-10 – 2018-12-12 (×4): 2 [IU] via SUBCUTANEOUS
  Administered 2018-12-12 – 2018-12-13 (×2): 1 [IU] via SUBCUTANEOUS

## 2018-12-09 MED ORDER — ASPIRIN EC 81 MG PO TBEC
81.0000 mg | DELAYED_RELEASE_TABLET | Freq: Every day | ORAL | Status: DC
  Administered 2018-12-09 – 2018-12-13 (×5): 81 mg via ORAL
  Filled 2018-12-09 (×5): qty 1

## 2018-12-09 MED ORDER — CALCIUM ACETATE (PHOS BINDER) 667 MG PO CAPS
667.0000 mg | ORAL_CAPSULE | Freq: Three times a day (TID) | ORAL | Status: DC
  Administered 2018-12-09 – 2018-12-13 (×13): 667 mg via ORAL
  Filled 2018-12-09 (×13): qty 1

## 2018-12-09 MED ORDER — ADULT MULTIVITAMIN W/MINERALS CH
1.0000 | ORAL_TABLET | Freq: Every day | ORAL | Status: DC
  Administered 2018-12-09 – 2018-12-11 (×3): 1 via ORAL
  Filled 2018-12-09 (×3): qty 1

## 2018-12-09 MED ORDER — ATORVASTATIN CALCIUM 10 MG PO TABS
20.0000 mg | ORAL_TABLET | Freq: Every day | ORAL | Status: DC
  Administered 2018-12-09 – 2018-12-13 (×5): 20 mg via ORAL
  Filled 2018-12-09 (×5): qty 2

## 2018-12-09 MED ORDER — ALBUTEROL SULFATE (2.5 MG/3ML) 0.083% IN NEBU: 2.5000 mg | INHALATION_SOLUTION | RESPIRATORY_TRACT | Status: DC | PRN

## 2018-12-09 MED ORDER — HEPARIN SODIUM (PORCINE) 5000 UNIT/ML IJ SOLN: 5000.0000 [IU] | Freq: Two times a day (BID) | INTRAMUSCULAR | Status: DC

## 2018-12-09 MED ORDER — PAROXETINE HCL 10 MG PO TABS
10.0000 mg | ORAL_TABLET | Freq: Every day | ORAL | Status: DC
  Administered 2018-12-09 – 2018-12-13 (×5): 10 mg via ORAL
  Filled 2018-12-09 (×5): qty 1

## 2018-12-09 MED ORDER — INSULIN ASPART 100 UNIT/ML ~~LOC~~ SOLN
0.0000 [IU] | Freq: Every day | SUBCUTANEOUS | Status: DC
  Administered 2018-12-10: 2 [IU] via SUBCUTANEOUS

## 2018-12-09 MED ORDER — SODIUM CHLORIDE 0.9% FLUSH: 3.0000 mL | INTRAVENOUS | Status: DC | PRN

## 2018-12-09 MED ORDER — HEPARIN SODIUM (PORCINE) 5000 UNIT/ML IJ SOLN
5000.0000 [IU] | Freq: Two times a day (BID) | INTRAMUSCULAR | Status: DC
  Administered 2018-12-09 – 2018-12-11 (×6): 5000 [IU] via SUBCUTANEOUS
  Filled 2018-12-09 (×6): qty 1

## 2018-12-09 MED ORDER — SODIUM CHLORIDE 0.9% FLUSH
3.0000 mL | Freq: Two times a day (BID) | INTRAVENOUS | Status: DC
  Administered 2018-12-09 – 2018-12-13 (×7): 3 mL via INTRAVENOUS

## 2018-12-09 NOTE — Progress Notes (Signed)
[facility] PROGRESS NOTE    Progress Note  REASON HELZER  LZJ:[id number] DOB: [birth year] DOA: 12/08/2018 PCP: [name], MD     Brief Narrative:   Blake [name] is an 73 y.o. male past medical history of end-stage renal disease, hypertension diabetes mellitus type 2 presents today with worsening weakness and shortness of breath after missing 3 dialysis session.  Assessment/Plan:   Acute pulmonary edema with uremia/end-stage renal disease/Hyperkalemia/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 [facility]): Nephrology was consulted for emergent dialysis. Status post dialysis, appreciate nephrology's assistance. Plan for dialysis again on 12/09/2018.  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 A1c was 6.5.  Noncompliance and poor insight on his medical condition: I spent about 15 minutes with the patient counseling him.  About his condition and how he should be compliant.  DVT prophylaxis: heparin Family Communication:none Disposition Plan/Barrier to D/C: home in am Code Status:     Code Status Orders  (From admission, onward)         Start     Ordered   12/09/18 0251  Full code  Continuous     12/09/18 0250        Code Status History    Date Active Date Inactive Code Status Order ID Comments User Context   12/09/2018 0250 12/09/2018 0250 Full Code [id number]  Merton Border, MD Inpatient   02/08/2018 0001 02/13/2018 2031 Full Code [id number]  Ivor Costa, MD ED   04/21/2016 0611 04/22/2016 1933 Full Code [id number]  Etta Quill, DO ED   08/04/2015 1912 08/06/2015 1857 Full Code [id number]  Lanae Crumbly, PA-C Inpatient   08/01/2015 1339 08/04/2015 1912 Full Code [id number]  Willia Craze, NP ED   06/28/2014 0924 07/09/2014 2045 Full Code [id number]  Donita Brooks, NP ED   10/17/2012 1623 10/20/2012 2058 Full Code [id number]  Chauncy Lean, RN Inpatient        IV Access:     Peripheral IV   Procedures and diagnostic studies:   Dg Chest Portable 1 View  Result Date: 12/08/2018 CLINICAL DATA:  Shortness of breath. Hypoxia. Chills. Missed 2 dialysis appointments. Off meds for 2 weeks. History of end-stage renal disease. Crackles on exam. EXAM: PORTABLE CHEST 1 VIEW COMPARISON:  02/07/2018 FINDINGS: Cardiomegaly is similar to prior exam. Diffuse interstitial opacities with slight progression from 2019. Small bilateral pleural effusions and bibasilar opacities. No pneumothorax. Probable calcified granuloma in the right lung. IMPRESSION: 1. Cardiomegaly and small pleural effusions. 2. Diffusely increased interstitial opacities, progressed from 2019 radiograph. Differential considerations include pulmonary edema versus interstitial lung disease. Electronically Signed   By: Keith Rake M.D.   On: 12/08/2018 20:16     Medical Consultants:    None.  Anti-Infectives:   None  Subjective:    [name] no complaints feels great.  Objective:    Vitals:   12/09/18 0203 12/09/18 0254 12/09/18 0400 12/09/18 0918  BP: (!) 126/58 (!) 140/54 (!) 107/50 (!) 117/53  Pulse: 71 73 69 70  Resp: 13 20 18    Temp: 97.7 F (36.5 C) 98.4 F (36.9 C) 98.3 F (36.8 C) (!) 97.5 F (36.4 C)  TempSrc: Oral Oral Oral Oral  SpO2: 96% 95% 94% 94%  Weight:      Height:        Intake/Output Summary (Last 24 hours) at 12/09/2018 0941 Last data filed at 12/09/2018 0921 Gross per 24  hour  Intake 0 ml  Output 3539 ml  Net -3539 ml   Filed Weights   12/08/18 1912  Weight: 92.5 kg    Exam: General exam: In no acute distress. Respiratory system: Good air movement and clear to auscultation. Cardiovascular system: S1 & S2 heard, RRR.  Gastrointestinal system: Abdomen is nondistended, soft and nontender.  Central nervous system: Alert and oriented. No focal neurological deficits. Extremities: No pedal edema. Skin: No rashes, lesions or ulcers Psychiatry:  Judgement and insight appear normal. Mood & affect appropriate.    Data Reviewed:    Labs: Basic Metabolic Panel: Recent Labs  Lab 12/08/18 1945  NA 140  K 7.1*  CL 106  CO2 16*  GLUCOSE 114*  BUN 146*  CREATININE 18.63*  CALCIUM 8.7*   GFR Estimated Creatinine Clearance: 4 mL/min (A) (by C-G formula based on SCr of 18.63 mg/dL (H)). Liver Function Tests: Recent Labs  Lab 12/08/18 1945  AST 23  ALT 14  ALKPHOS 77  BILITOT 0.7  PROT 7.0  ALBUMIN 3.0*   No results for input(s): LIPASE, AMYLASE in the last 168 hours. No results for input(s): AMMONIA in the last 168 hours. Coagulation profile No results for input(s): INR, PROTIME in the last 168 hours.  CBC: Recent Labs  Lab 12/08/18 1945  WBC 9.0  NEUTROABS 7.2  HGB 9.3*  HCT 30.0*  MCV 91.2  PLT 163   Cardiac Enzymes: No results for input(s): CKTOTAL, CKMB, CKMBINDEX, TROPONINI in the last 168 hours. BNP (last 3 results) No results for input(s): PROBNP in the last 8760 hours. CBG: Recent Labs  Lab 12/09/18 0325 12/09/18 0704  GLUCAP 74 71   D-Dimer: No results for input(s): DDIMER in the last 72 hours. Hgb A1c: Recent Labs    12/09/18 0337  HGBA1C 6.5*   Lipid Profile: No results for input(s): CHOL, HDL, LDLCALC, TRIG, CHOLHDL, LDLDIRECT in the last 72 hours. Thyroid function studies: No results for input(s): TSH, T4TOTAL, T3FREE, THYROIDAB in the last 72 hours.  Invalid input(s): FREET3 Anemia work up: No results for input(s): VITAMINB12, FOLATE, FERRITIN, TIBC, IRON, RETICCTPCT in the last 72 hours. Sepsis Labs: Recent Labs  Lab 12/08/18 1945 12/08/18 2010 12/09/18 0337  WBC 9.0  --   --   LATICACIDVEN  --  1.1 1.5   Microbiology Recent Results (from the past 240 hour(s))  MRSA PCR Screening     Status: None   Collection Time: 12/09/18  2:58 AM  Result Value Ref Range Status   MRSA by PCR NEGATIVE NEGATIVE Final    Comment:        The GeneXpert MRSA Assay (FDA approved for  NASAL specimens only), is one component of a comprehensive MRSA colonization surveillance program. It is not intended to diagnose MRSA infection nor to guide or monitor treatment for MRSA infections. Performed at [facility], [place] [address], [place], [place] [address]      Medications:   . albuterol  2.5 mg Nebulization Q6H  . aspirin EC  81 mg Oral Daily  . atorvastatin  20 mg Oral Daily  . calcium acetate  667 mg Oral TID WC  . Chlorhexidine Gluconate Cloth  6 each Topical Q0600  . darbepoetin (ARANESP) injection - DIALYSIS  60 mcg Intravenous Q Sat-HD  . insulin aspart  5 Units Intravenous Once   And  . dextrose  1 ampule Intravenous Once  . famotidine  10 mg Oral Daily  . heparin      .  heparin  5,000 Units Subcutaneous BID  . insulin aspart  0-5 Units Subcutaneous QHS  . insulin aspart  0-9 Units Subcutaneous TID WC  . multivitamin  1 tablet Oral Q T,Th,Sa-HD  . multivitamin with minerals  1 tablet Oral Daily  . pantoprazole  20 mg Oral Daily  . PARoxetine  10 mg Oral Daily  . sevelamer carbonate  800 mg Oral TID WC  . sodium chloride flush  3 mL Intravenous Q12H   Continuous Infusions: . sodium chloride    . calcium gluconate        LOS: 1 day   Charlynne Cousins  [facility]  12/09/2018, 9:41 AM

## 2018-12-09 NOTE — Progress Notes (Addendum)
[facility] Progress Note   Subjective:  Seen in room. Dyspnea improved. S/p HD yesterday with plan for another session today. No CP or other concerns.  Objective Vitals:   12/09/18 0200 12/09/18 0203 12/09/18 0254 12/09/18 0400  BP: (!) 105/51 (!) 126/58 (!) 140/54 (!) 107/50  Pulse: 73 71 73 69  Resp:  13 20 18   Temp:  97.7 F (36.5 C) 98.4 F (36.9 C) 98.3 F (36.8 C)  TempSrc:  Oral Oral Oral  SpO2:  96% 95% 94%  Weight:      Height:       Physical Exam General: Well appearing man, NAD Heart: RRR; no murmur Lungs: CTAB Abdomen: soft, non-tender Extremities: No LE edema Dialysis Access:  RUE AVF + bruit  Additional Objective Labs: Basic Metabolic Panel: Recent Labs  Lab 12/08/18 1945  NA 140  K 7.1*  CL 106  CO2 16*  GLUCOSE 114*  BUN 146*  CREATININE 18.63*  CALCIUM 8.7*   Liver Function Tests: Recent Labs  Lab 12/08/18 1945  AST 23  ALT 14  ALKPHOS 77  BILITOT 0.7  PROT 7.0  ALBUMIN 3.0*   CBC: Recent Labs  Lab 12/08/18 1945  WBC 9.0  NEUTROABS 7.2  HGB 9.3*  HCT 30.0*  MCV 91.2  PLT 163   Studies/Results: Dg Chest Portable 1 View  Result Date: 12/08/2018 CLINICAL DATA:  Shortness of breath. Hypoxia. Chills. Missed 2 dialysis appointments. Off meds for 2 weeks. History of end-stage renal disease. Crackles on exam. EXAM: PORTABLE CHEST 1 VIEW COMPARISON:  02/07/2018 FINDINGS: Cardiomegaly is similar to prior exam. Diffuse interstitial opacities with slight progression from 2019. Small bilateral pleural effusions and bibasilar opacities. No pneumothorax. Probable calcified granuloma in the right lung. IMPRESSION: 1. Cardiomegaly and small pleural effusions. 2. Diffusely increased interstitial opacities, progressed from 2019 radiograph. Differential considerations include pulmonary edema versus interstitial lung disease. Electronically Signed   By: Keith Rake M.D.   On: 12/08/2018 20:16   Medications: . sodium chloride     . calcium gluconate     . albuterol  2.5 mg Nebulization Q6H  . aspirin EC  81 mg Oral Daily  . atorvastatin  20 mg Oral Daily  . calcium acetate  667 mg Oral TID WC  . Chlorhexidine Gluconate Cloth  6 each Topical Q0600  . insulin aspart  5 Units Intravenous Once   And  . dextrose  1 ampule Intravenous Once  . famotidine  10 mg Oral Daily  . heparin      . heparin  5,000 Units Subcutaneous BID  . insulin aspart  0-5 Units Subcutaneous QHS  . insulin aspart  0-9 Units Subcutaneous TID WC  . multivitamin  1 tablet Oral Q T,Th,Sa-HD  . multivitamin with minerals  1 tablet Oral Daily  . pantoprazole  20 mg Oral Daily  . PARoxetine  10 mg Oral Daily  . sevelamer carbonate  800 mg Oral TID WC  . sodium chloride flush  3 mL Intravenous Q12H    Dialysis Orders: Center: [facility]  on TTS . EDW 88kg HD Bath 2K/3.5Ca  Time 4 hours Heparin 6000. Access RUE AVF BFR 400 DFR 800     Assessment/Plan: 1. Acute hypoxic respiratory failure: In setting of missed HD and pulm edema. Serial HD planned. 2. Hyperkalemia: Improved s/p urgent HD 3/27. 3.  ESRD: Extra HD 3/27, now back to usual TTS schedule. HD today. 4.  Hypertension/volume: BP ok, but pulm edema present on imaging -  UF as tolerated. 5. Anemia: Hgb 9.3, start ESA weekly. 6.  Metabolic bone disease: Corr Ca ok. Continue renvela and sensipar 7.  Nutrition - renal diet. 8. Low grade fever- he denies cough.  No sick exposures 9. Noncompliance- concern over his ability to care for himself.  Will follow mental status after serial HD.    Veneta Penton, PA-C 12/09/2018, 9:15 AM  [facility] Pager: [phone]  I have seen and examined this patient and agree with plan and assessment in the above note with renal recommendations/intervention highlighted.  Pt still with volume overload and significant azotemia.  Plan for HD again today.  Stressed importance of compliance with HD.  Broadus John A Eldana Isip,MD 12/09/2018 12:05  PM

## 2018-12-10 ENCOUNTER — Inpatient Hospital Stay (HOSPITAL_COMMUNITY): Payer: Medicare Other

## 2018-12-10 LAB — CBC WITH DIFFERENTIAL/PLATELET
Abs Immature Granulocytes: 0.02 10*3/uL (ref 0.00–0.07)
BASOS ABS: 0 10*3/uL (ref 0.0–0.1)
Basophils Relative: 0 %
Eosinophils Absolute: 0.2 10*3/uL (ref 0.0–0.5)
Eosinophils Relative: 2 %
HCT: 34.5 % — ABNORMAL LOW (ref 39.0–52.0)
Hemoglobin: 11.1 g/dL — ABNORMAL LOW (ref 13.0–17.0)
Immature Granulocytes: 0 %
Lymphocytes Relative: 16 %
Lymphs Abs: 1.1 10*3/uL (ref 0.7–4.0)
MCH: 29.6 pg (ref 26.0–34.0)
MCHC: 32.2 g/dL (ref 30.0–36.0)
MCV: 92 fL (ref 80.0–100.0)
Monocytes Absolute: 1.1 10*3/uL — ABNORMAL HIGH (ref 0.1–1.0)
Monocytes Relative: 15 %
Neutro Abs: 4.7 10*3/uL (ref 1.7–7.7)
Neutrophils Relative %: 67 %
Platelets: 168 10*3/uL (ref 150–400)
RBC: 3.75 MIL/uL — ABNORMAL LOW (ref 4.22–5.81)
RDW: 13.3 % (ref 11.5–15.5)
WBC: 7.1 10*3/uL (ref 4.0–10.5)
nRBC: 0 % (ref 0.0–0.2)

## 2018-12-10 LAB — BASIC METABOLIC PANEL
Anion gap: 16 — ABNORMAL HIGH (ref 5–15)
BUN: 26 mg/dL — ABNORMAL HIGH (ref 8–23)
CHLORIDE: 93 mmol/L — AB (ref 98–111)
CO2: 26 mmol/L (ref 22–32)
Calcium: 8.8 mg/dL — ABNORMAL LOW (ref 8.9–10.3)
Creatinine, Ser: 6.4 mg/dL — ABNORMAL HIGH (ref 0.61–1.24)
GFR calc Af Amer: 9 mL/min — ABNORMAL LOW (ref >60)
GFR calc non Af Amer: 8 mL/min — ABNORMAL LOW (ref >60)
Glucose, Bld: 105 mg/dL — ABNORMAL HIGH (ref 70–99)
Potassium: 4.6 mmol/L (ref 3.5–5.1)
SODIUM: 135 mmol/L (ref 135–145)

## 2018-12-10 LAB — RENAL FUNCTION PANEL
Albumin: 2.9 g/dL — ABNORMAL LOW (ref 3.5–5.0)
Anion gap: 11 (ref 5–15)
BUN: 51 mg/dL — ABNORMAL HIGH (ref 8–23)
CO2: 26 mmol/L (ref 22–32)
Calcium: 8.2 mg/dL — ABNORMAL LOW (ref 8.9–10.3)
Chloride: 97 mmol/L — ABNORMAL LOW (ref 98–111)
Creatinine, Ser: 9.81 mg/dL — ABNORMAL HIGH (ref 0.61–1.24)
GFR calc Af Amer: 6 mL/min — ABNORMAL LOW (ref >60)
GFR calc non Af Amer: 5 mL/min — ABNORMAL LOW (ref >60)
Glucose, Bld: 203 mg/dL — ABNORMAL HIGH (ref 70–99)
Phosphorus: 5.2 mg/dL — ABNORMAL HIGH (ref 2.5–4.6)
Potassium: 4.8 mmol/L (ref 3.5–5.1)
Sodium: 134 mmol/L — ABNORMAL LOW (ref 135–145)

## 2018-12-10 LAB — GLUCOSE, CAPILLARY
GLUCOSE-CAPILLARY: 90 mg/dL (ref 70–99)
GLUCOSE-CAPILLARY: 155 mg/dL — AB (ref 70–99)
Glucose-Capillary: 95 mg/dL (ref 70–99)
Glucose-Capillary: 159 mg/dL — ABNORMAL HIGH (ref 70–99)
Glucose-Capillary: 212 mg/dL — ABNORMAL HIGH (ref 70–99)

## 2018-12-10 NOTE — Progress Notes (Addendum)
[facility] Progress Note   Subjective:  Seen in room. Still with dyspnea - no CP currently. Had a rough time with HD yesterday - BP dropped early in session with significant vomiting - only got down to 92.4kg (prior EDW 88kg as outpatient).   Objective Vitals:   12/09/18 1743 12/09/18 2242 12/10/18 0444 12/10/18 0933  BP: (!) 110/59 (!) 94/57 (!) 110/57 (!) 118/41  Pulse: 76 76 75 (!) 45  Resp:  16 20 18   Temp: 98.2 F (36.8 C) 97.7 F (36.5 C) 97.8 F (36.6 C) 98.3 F (36.8 C)  TempSrc: Oral Oral  Oral  SpO2:  93% 98% 99%  Weight:  92.3 kg    Height:       Physical Exam General: Well appearing man, NAD. Wearing nasal oxygen. Heart: RRR; no murmur Lungs: CTAB Abdomen: soft, non-tender Extremities: No LE edema Dialysis Access:  RUE AVF + bruit  Additional Objective Labs: Basic Metabolic Panel: Recent Labs  Lab 12/08/18 1945 12/09/18 1346 12/10/18 0515  NA 140 134* 135  K 7.1* 4.8 4.6  CL 106 97* 93*  CO2 16* 26 26  GLUCOSE 114* 203* 105*  BUN 146* 51* 26*  CREATININE 18.63* 9.81* 6.40*  CALCIUM 8.7* 8.2* 8.8*  PHOS  --  5.2*  --    Liver Function Tests: Recent Labs  Lab 12/08/18 1945 12/09/18 1346  AST 23  --   ALT 14  --   ALKPHOS 77  --   BILITOT 0.7  --   PROT 7.0  --   ALBUMIN 3.0* 2.9*   CBC: Recent Labs  Lab 12/08/18 1945 12/09/18 1347 12/10/18 0515  WBC 9.0 9.1 7.1  NEUTROABS 7.2  --  4.7  HGB 9.3* 9.7* 11.1*  HCT 30.0* 30.4* 34.5*  MCV 91.2 91.0 92.0  PLT 163 161 168   Studies/Results: Dg Chest Portable 1 View  Result Date: 12/08/2018 CLINICAL DATA:  Shortness of breath. Hypoxia. Chills. Missed 2 dialysis appointments. Off meds for 2 weeks. History of end-stage renal disease. Crackles on exam. EXAM: PORTABLE CHEST 1 VIEW COMPARISON:  02/07/2018 FINDINGS: Cardiomegaly is similar to prior exam. Diffuse interstitial opacities with slight progression from 2019. Small bilateral pleural effusions and bibasilar opacities. No  pneumothorax. Probable calcified granuloma in the right lung. IMPRESSION: 1. Cardiomegaly and small pleural effusions. 2. Diffusely increased interstitial opacities, progressed from 2019 radiograph. Differential considerations include pulmonary edema versus interstitial lung disease. Electronically Signed   By: Keith Rake M.D.   On: 12/08/2018 20:16   Medications: . sodium chloride    . calcium gluconate     . aspirin EC  81 mg Oral Daily  . atorvastatin  20 mg Oral Daily  . calcium acetate  667 mg Oral TID WC  . Chlorhexidine Gluconate Cloth  6 each Topical Q0600  . darbepoetin (ARANESP) injection - DIALYSIS  60 mcg Intravenous Q Sat-HD  . insulin aspart  5 Units Intravenous Once   And  . dextrose  1 ampule Intravenous Once  . famotidine  10 mg Oral Daily  . heparin  5,000 Units Subcutaneous BID  . insulin aspart  0-5 Units Subcutaneous QHS  . insulin aspart  0-9 Units Subcutaneous TID WC  . multivitamin  1 tablet Oral Q T,Th,Sa-HD  . multivitamin with minerals  1 tablet Oral Daily  . pantoprazole  20 mg Oral Daily  . PARoxetine  10 mg Oral Daily  . sevelamer carbonate  800 mg Oral TID WC  . sodium  chloride flush  3 mL Intravenous Q12H    Dialysis Orders: [facility]. EDW88kgHD Bath 2K/3.5CaTime 4 hoursHeparin 6000. AccessRUE AVFBFR 400DFR 800  Assessment/Plan: 1. Acute hypoxic respiratory failure: In setting of missed HD and pulm edema. Improved slightly - still feels orthopneic, but issues with UF during last HD. Will get CXR to see if pulm edema improved. 2. Hyperkalemia: Improved s/p urgent HD 3/27. 3. ESRD: Extra HD 3/27, then HD 3/28 (back to TTS schedule). Will get CXR to see if pulm edema still present, if so will plan on another HD on Monday 3/30. 4. Hypertension/volume: BP ok, dropped with last HD - no LE edema. Chest imaging pending. 5. Anemia: Hgb 11.1 s/p Aranesp 60 - follow. 6. Metabolic bone disease: Corr Ca/Phos ok. Continue  renvela and sensipar 7. Nutrition- renal diet. 8. Low grade fever on admit: Denied cough or sick contacts, resolved.  9. Noncompliance- concern over his ability to care for himself. MS improved. 10. T2DM: On SSI, per primary.  Veneta Penton, PA-C 12/10/2018, 9:41 AM  [facility] Pager: [phone]  I have seen and examined this patient and agree with plan and assessment in the above note with renal recommendations/intervention highlighted.  Will plan for HD again tomorrow for UF as tolerated.  Concerning that he developed hypotension with N/V with HD.  Bradycardia also concerning.  Continue to follow. [place],MD 12/10/2018 11:53 AM

## 2018-12-10 NOTE — Progress Notes (Signed)
[facility] PROGRESS NOTE    Progress Note  Blake [name]  WUJ:[id number] DOB: 26-Apr-1946 DOA: 12/08/2018 PCP: [name], MD     Brief Narrative:   Blake [name] is an 73 y.o. male past medical history of end-stage renal disease, hypertension diabetes mellitus type 2 presents today with worsening weakness and shortness of breath after missing 3 dialysis session.  Assessment/Plan:   Acute pulmonary edema with uremia/end-stage renal disease/Hyperkalemia/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 [facility]): Continue dialysis per nephrology.  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 A1c was 6.5.  Noncompliance and poor insight on his medical condition: No sling was performed.  Orthostatic hypotension: Discontinue oral antihypertensive medication, will discuss with renal the need for IV fluid. Recheck orthostatics, or when he is lying down is 118/45 with a pulse of 45. He did some dizziness when he sat up to eat.   DVT prophylaxis: heparin Family Communication:none Disposition Plan/Barrier to D/C: home in am Code Status:     Code Status Orders  (From admission, onward)         Start     Ordered   12/09/18 0251  Full code  Continuous     12/09/18 0250        Code Status History    Date Active Date Inactive Code Status Order ID Comments User Context   12/09/2018 0250 12/09/2018 0250 Full Code [id number]  Merton Border, MD Inpatient   02/08/2018 0001 02/13/2018 2031 Full Code [id number]  Ivor Costa, MD ED   04/21/2016 0611 04/22/2016 1933 Full Code [id number]  Etta Quill, DO ED   08/04/2015 1912 08/06/2015 1857 Full Code [id number]  Lanae Crumbly, PA-C Inpatient   08/01/2015 1339 08/04/2015 1912 Full Code [id number]  Willia Craze, NP ED   06/28/2014 0924 07/09/2014 2045 Full Code [id number]  Donita Brooks, NP ED   10/17/2012 1623 10/20/2012 2058 Full Code [id number]   Chauncy Lean, RN Inpatient        IV Access:    Peripheral IV   Procedures and diagnostic studies:   Dg Chest Portable 1 View  Result Date: 12/08/2018 CLINICAL DATA:  Shortness of breath. Hypoxia. Chills. Missed 2 dialysis appointments. Off meds for 2 weeks. History of end-stage renal disease. Crackles on exam. EXAM: PORTABLE CHEST 1 VIEW COMPARISON:  02/07/2018 FINDINGS: Cardiomegaly is similar to prior exam. Diffuse interstitial opacities with slight progression from 2019. Small bilateral pleural effusions and bibasilar opacities. No pneumothorax. Probable calcified granuloma in the right lung. IMPRESSION: 1. Cardiomegaly and small pleural effusions. 2. Diffusely increased interstitial opacities, progressed from 2019 radiograph. Differential considerations include pulmonary edema versus interstitial lung disease. Electronically Signed   By: Keith Rake M.D.   On: 12/08/2018 20:16     Medical Consultants:    None.  Anti-Infectives:   None  Subjective:    Blake [name] relates dizziness upon standing.  Objective:    Vitals:   12/09/18 1743 12/09/18 2242 12/10/18 0444 12/10/18 0933  BP: (!) 110/59 (!) 94/57 (!) 110/57 (!) 118/41  Pulse: 76 76 75 (!) 45  Resp:  16 20 18   Temp: 98.2 F (36.8 C) 97.7 F (36.5 C) 97.8 F (36.6 C) 98.3 F (36.8 C)  TempSrc: Oral Oral  Oral  SpO2:  93% 98% 99%  Weight:  92.3 kg    Height:        Intake/Output Summary (Last 24  hours) at 12/10/2018 0955 Last data filed at 12/10/2018 0600 Gross per 24 hour  Intake 3 ml  Output 3226 ml  Net -3223 ml   Filed Weights   12/09/18 1318 12/09/18 1731 12/09/18 2242  Weight: 94.9 kg 92.4 kg 92.3 kg    Exam: General exam: In no acute distress. Respiratory system: Good air movement and clear to auscultation. Cardiovascular system: S1 & S2 heard, RRR.  Gastrointestinal system: Abdomen is nondistended, soft and nontender.  Central nervous system: Alert and oriented. No focal  neurological deficits. Extremities: No pedal edema. Skin: No rashes, lesions or ulcers Psychiatry: Judgement and insight appear normal. Mood & affect appropriate.    Data Reviewed:    Labs: Basic Metabolic Panel: Recent Labs  Lab 12/08/18 1945 12/09/18 1346 12/10/18 0515  NA 140 134* 135  K 7.1* 4.8 4.6  CL 106 97* 93*  CO2 16* 26 26  GLUCOSE 114* 203* 105*  BUN 146* 51* 26*  CREATININE 18.63* 9.81* 6.40*  CALCIUM 8.7* 8.2* 8.8*  PHOS  --  5.2*  --    GFR Estimated Creatinine Clearance: 11.7 mL/min (A) (by C-G formula based on SCr of 6.4 mg/dL (H)). Liver Function Tests: Recent Labs  Lab 12/08/18 1945 12/09/18 1346  AST 23  --   ALT 14  --   ALKPHOS 77  --   BILITOT 0.7  --   PROT 7.0  --   ALBUMIN 3.0* 2.9*   No results for input(s): LIPASE, AMYLASE in the last 168 hours. No results for input(s): AMMONIA in the last 168 hours. Coagulation profile No results for input(s): INR, PROTIME in the last 168 hours.  CBC: Recent Labs  Lab 12/08/18 1945 12/09/18 1347 12/10/18 0515  WBC 9.0 9.1 7.1  NEUTROABS 7.2  --  4.7  HGB 9.3* 9.7* 11.1*  HCT 30.0* 30.4* 34.5*  MCV 91.2 91.0 92.0  PLT 163 161 168   Cardiac Enzymes: No results for input(s): CKTOTAL, CKMB, CKMBINDEX, TROPONINI in the last 168 hours. BNP (last 3 results) No results for input(s): PROBNP in the last 8760 hours. CBG: Recent Labs  Lab 12/09/18 0704 12/09/18 1135 12/09/18 1738 12/09/18 2239 12/10/18 0721  GLUCAP 71 140* 89 121* 90   D-Dimer: No results for input(s): DDIMER in the last 72 hours. Hgb A1c: Recent Labs    12/09/18 0337  HGBA1C 6.5*   Lipid Profile: No results for input(s): CHOL, HDL, LDLCALC, TRIG, CHOLHDL, LDLDIRECT in the last 72 hours. Thyroid function studies: No results for input(s): TSH, T4TOTAL, T3FREE, THYROIDAB in the last 72 hours.  Invalid input(s): FREET3 Anemia work up: No results for input(s): VITAMINB12, FOLATE, FERRITIN, TIBC, IRON, RETICCTPCT in  the last 72 hours. Sepsis Labs: Recent Labs  Lab 12/08/18 1945 12/08/18 2010 12/09/18 0337 12/09/18 1347 12/10/18 0515  WBC 9.0  --   --  9.1 7.1  LATICACIDVEN  --  1.1 1.5  --   --    Microbiology Recent Results (from the past 240 hour(s))  MRSA PCR Screening     Status: None   Collection Time: 12/09/18  2:58 AM  Result Value Ref Range Status   MRSA by PCR NEGATIVE NEGATIVE Final    Comment:        The GeneXpert MRSA Assay (FDA approved for NASAL specimens only), is one component of a comprehensive MRSA colonization surveillance program. It is not intended to diagnose MRSA infection nor to guide or monitor treatment for MRSA infections. Performed at [facility],  [address], [place], [place] [address]      Medications:   . aspirin EC  81 mg Oral Daily  . atorvastatin  20 mg Oral Daily  . calcium acetate  667 mg Oral TID WC  . Chlorhexidine Gluconate Cloth  6 each Topical Q0600  . darbepoetin (ARANESP) injection - DIALYSIS  60 mcg Intravenous Q Sat-HD  . insulin aspart  5 Units Intravenous Once   And  . dextrose  1 ampule Intravenous Once  . famotidine  10 mg Oral Daily  . heparin  5,000 Units Subcutaneous BID  . insulin aspart  0-5 Units Subcutaneous QHS  . insulin aspart  0-9 Units Subcutaneous TID WC  . multivitamin  1 tablet Oral Q T,Th,Sa-HD  . multivitamin with minerals  1 tablet Oral Daily  . pantoprazole  20 mg Oral Daily  . PARoxetine  10 mg Oral Daily  . sevelamer carbonate  800 mg Oral TID WC  . sodium chloride flush  3 mL Intravenous Q12H   Continuous Infusions: . sodium chloride    . calcium gluconate        LOS: 2 days   Charlynne Cousins  [facility]  12/10/2018, 9:55 AM

## 2018-12-10 NOTE — Progress Notes (Signed)
Called by [employer], pt with ventricular bigeminy. Not seen before on this admission. Patient asymptomatic. [place] paged to inform.

## 2018-12-11 LAB — GLUCOSE, CAPILLARY
GLUCOSE-CAPILLARY: 106 mg/dL — AB (ref 70–99)
GLUCOSE-CAPILLARY: 169 mg/dL — AB (ref 70–99)
Glucose-Capillary: 188 mg/dL — ABNORMAL HIGH (ref 70–99)
Glucose-Capillary: 161 mg/dL — ABNORMAL HIGH (ref 70–99)

## 2018-12-11 MED ORDER — CHLORHEXIDINE GLUCONATE CLOTH 2 % EX PADS
6.0000 | MEDICATED_PAD | Freq: Every day | CUTANEOUS | Status: DC
  Administered 2018-12-11 – 2018-12-13 (×2): 6 via TOPICAL

## 2018-12-11 NOTE — Progress Notes (Signed)
[facility] PROGRESS NOTE    Progress Note  Blake [name]  JGG:[id number] DOB: 1946/05/15 DOA: 12/08/2018 PCP: [name], MD     Brief Narrative:   Blake [name] is an 73 y.o. male past medical history of end-stage renal disease, hypertension diabetes mellitus type 2 presents today with worsening weakness and shortness of breath after missing 3 dialysis session.  Assessment/Plan:   Acute pulmonary edema with uremia/end-stage renal disease/Hyperkalemia/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 [facility]): Continue dialysis per nephrology.  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 A1c was 6.5.  Noncompliance and poor insight on his medical condition: No sling was performed.  Orthostatic hypotension: Partial ortho negative, no further nausea. Recheck Orhtostatic's. If negative home today after HD.   DVT prophylaxis: heparin Family Communication:none Disposition Plan/Barrier to D/C: home after HD if orthostatics negative. Code Status:     Code Status Orders  (From admission, onward)         Start     Ordered   12/09/18 0251  Full code  Continuous     12/09/18 0250        Code Status History    Date Active Date Inactive Code Status Order ID Comments User Context   12/09/2018 0250 12/09/2018 0250 Full Code [id number]  Merton Border, MD Inpatient   02/08/2018 0001 02/13/2018 2031 Full Code [id number]  Ivor Costa, MD ED   04/21/2016 0611 04/22/2016 1933 Full Code [id number]  Etta Quill, DO ED   08/04/2015 1912 08/06/2015 1857 Full Code [id number]  Lanae Crumbly, PA-C Inpatient   08/01/2015 1339 08/04/2015 1912 Full Code [id number]  Willia Craze, NP ED   06/28/2014 0924 07/09/2014 2045 Full Code [id number]  Donita Brooks, NP ED   10/17/2012 1623 10/20/2012 2058 Full Code [id number]  Chauncy Lean, RN Inpatient        IV Access:    Peripheral IV   Procedures  and diagnostic studies:   Dg Chest 2 View  Result Date: 12/10/2018 CLINICAL DATA:  Dyspnea. EXAM: CHEST - 2 VIEW COMPARISON:  Radiograph of December 08, 2018. FINDINGS: Stable cardiomegaly. No pneumothorax or pleural effusion is noted. Significantly decreased bilateral diffuse interstitial densities are noted suggesting significantly improved edema or inflammation. Bony thorax is unremarkable. IMPRESSION: Significantly decreased bilateral diffuse interstitial densities are noted suggesting significantly improved bilateral edema or inflammation. Electronically Signed   By: Marijo Conception, M.D.   On: 12/10/2018 10:58     Medical Consultants:    None.  Anti-Infectives:   None  Subjective:    Blake [name] he relates no further nausea or vomiting with sitting up.  Objective:    Vitals:   12/10/18 2040 12/10/18 2302 12/11/18 0517 12/11/18 0844  BP: (!) 113/46 126/60 (!) 113/50 (!) 116/51  Pulse: (!) 46 74 66 63  Resp: 20  20 18   Temp: 98.3 F (36.8 C) 97.7 F (36.5 C) 98.4 F (36.9 C) (!) 97.5 F (36.4 C)  TempSrc:  Oral  Oral  SpO2: 98% 100% 100% 100%  Weight:      Height:        Intake/Output Summary (Last 24 hours) at 12/11/2018 0951 Last data filed at 12/11/2018 0900 Gross per 24 hour  Intake 370 ml  Output 0 ml  Net 370 ml   Filed Weights   12/09/18 1318 12/09/18 1731 12/09/18 2242  Weight: 94.9 kg 92.4 kg 92.3  kg    Exam: General exam: In no acute distress. Respiratory system: Good air movement and clear to auscultation. Cardiovascular system: S1 & S2 heard, RRR.  Gastrointestinal system: Abdomen is nondistended, soft and nontender.  Central nervous system: Alert and oriented. No focal neurological deficits. Extremities: No pedal edema. Skin: No rashes, lesions or ulcers Psychiatry: Judgement and insight appear normal. Mood & affect appropriate.    Data Reviewed:    Labs: Basic Metabolic Panel: Recent Labs  Lab 12/08/18 1945 12/09/18 1346  12/10/18 0515  NA 140 134* 135  K 7.1* 4.8 4.6  CL 106 97* 93*  CO2 16* 26 26  GLUCOSE 114* 203* 105*  BUN 146* 51* 26*  CREATININE 18.63* 9.81* 6.40*  CALCIUM 8.7* 8.2* 8.8*  PHOS  --  5.2*  --    GFR Estimated Creatinine Clearance: 11.7 mL/min (A) (by C-G formula based on SCr of 6.4 mg/dL (H)). Liver Function Tests: Recent Labs  Lab 12/08/18 1945 12/09/18 1346  AST 23  --   ALT 14  --   ALKPHOS 77  --   BILITOT 0.7  --   PROT 7.0  --   ALBUMIN 3.0* 2.9*   No results for input(s): LIPASE, AMYLASE in the last 168 hours. No results for input(s): AMMONIA in the last 168 hours. Coagulation profile No results for input(s): INR, PROTIME in the last 168 hours.  CBC: Recent Labs  Lab 12/08/18 1945 12/09/18 1347 12/10/18 0515  WBC 9.0 9.1 7.1  NEUTROABS 7.2  --  4.7  HGB 9.3* 9.7* 11.1*  HCT 30.0* 30.4* 34.5*  MCV 91.2 91.0 92.0  PLT 163 161 168   Cardiac Enzymes: No results for input(s): CKTOTAL, CKMB, CKMBINDEX, TROPONINI in the last 168 hours. BNP (last 3 results) No results for input(s): PROBNP in the last 8760 hours. CBG: Recent Labs  Lab 12/10/18 1125 12/10/18 1623 12/10/18 2040 12/10/18 2302 12/11/18 0729  GLUCAP 95 159* 212* 155* 106*   D-Dimer: No results for input(s): DDIMER in the last 72 hours. Hgb A1c: Recent Labs    12/09/18 0337  HGBA1C 6.5*   Lipid Profile: No results for input(s): CHOL, HDL, LDLCALC, TRIG, CHOLHDL, LDLDIRECT in the last 72 hours. Thyroid function studies: No results for input(s): TSH, T4TOTAL, T3FREE, THYROIDAB in the last 72 hours.  Invalid input(s): FREET3 Anemia work up: No results for input(s): VITAMINB12, FOLATE, FERRITIN, TIBC, IRON, RETICCTPCT in the last 72 hours. Sepsis Labs: Recent Labs  Lab 12/08/18 1945 12/08/18 2010 12/09/18 0337 12/09/18 1347 12/10/18 0515  WBC 9.0  --   --  9.1 7.1  LATICACIDVEN  --  1.1 1.5  --   --    Microbiology Recent Results (from the past 240 hour(s))  MRSA PCR  Screening     Status: None   Collection Time: 12/09/18  2:58 AM  Result Value Ref Range Status   MRSA by PCR NEGATIVE NEGATIVE Final    Comment:        The GeneXpert MRSA Assay (FDA approved for NASAL specimens only), is one component of a comprehensive MRSA colonization surveillance program. It is not intended to diagnose MRSA infection nor to guide or monitor treatment for MRSA infections. Performed at [facility], [place] [address], [place], [place] [address]      Medications:   . aspirin EC  81 mg Oral Daily  . atorvastatin  20 mg Oral Daily  . calcium acetate  667 mg Oral TID WC  . Chlorhexidine Gluconate  Cloth  6 each Topical [id number]  . darbepoetin (ARANESP) injection - DIALYSIS  60 mcg Intravenous Q Sat-HD  . insulin aspart  5 Units Intravenous Once   And  . dextrose  1 ampule Intravenous Once  . famotidine  10 mg Oral Daily  . heparin  5,000 Units Subcutaneous BID  . insulin aspart  0-5 Units Subcutaneous QHS  . insulin aspart  0-9 Units Subcutaneous TID WC  . multivitamin  1 tablet Oral Q T,Th,Sa-HD  . multivitamin with minerals  1 tablet Oral Daily  . pantoprazole  20 mg Oral Daily  . PARoxetine  10 mg Oral Daily  . sevelamer carbonate  800 mg Oral TID WC  . sodium chloride flush  3 mL Intravenous Q12H   Continuous Infusions: . sodium chloride    . calcium gluconate        LOS: 3 days   Charlynne Cousins  [facility]  12/11/2018, 9:51 AM

## 2018-12-11 NOTE — Care Management Important Message (Signed)
Important Message  Patient Details  Name: Blake [name] MRN: [id number] Date of Birth: Oct 16, 1945   Medicare Important Message Given:  Yes    Orbie Pyo 12/11/2018, 2:28 PM

## 2018-12-11 NOTE — Progress Notes (Addendum)
[facility] Progress Note   Dialysis Orders: Center:[facility]. EDW88kgHD Bath 2K/3.5CaTime 4 hoursHeparin 6000. AccessRUE AVFBFR 400DFR 800  Assessment/Plan: 1. Acute hypoxic respiratory failure: In setting of missed HD and pulm edema. CXR 3/29 sig decrease in bilateral diffuse edema 3,5 3/27 and 2.7 3/28 removed post wt 92.4- still above EDW - gets to EDW at outpatient unit when last there 3/21  2. Hyperkalemia: Improved s/p urgent HD 3/27. 3. ESRD: Extra HD 3/27, then HD 3/28 (back to TTS schedule).Tuesday 4. Hypertension/volume: BP lower than outpatient BP- BP meds d/c - no LE edema 5. Anemia: Hgb 11.1 s/p Aranesp 60 - follow. 6. Metabolic bone disease: Corr Ca/Phos ok. Continuerenvela and sensipar 7. Nutrition- renal diet. 8. Low grade fever on admit: Denied cough or sick contacts, resolved.  9. Noncompliance- concern over his ability to care for himself. MS improved. 10. T2DM: On SSI, per primary.  Myriam Jacobson, PA-C [place] 12/11/2018,9:43 AM  LOS: 3 days   Pt seen, examined and agree w A/P as above.  [facility] 12/11/2018, 5:25 PM    Subjective:   Very drowsy. Does not home O2  Objective Vitals:   12/10/18 2040 12/10/18 2302 12/11/18 0517 12/11/18 0844  BP: (!) 113/46 126/60 (!) 113/50 (!) 116/51  Pulse: (!) 46 74 66 63  Resp: 20  20 18   Temp: 98.3 F (36.8 C) 97.7 F (36.5 C) 98.4 F (36.9 C) (!) 97.5 F (36.4 C)  TempSrc:  Oral  Oral  SpO2: 98% 100% 100% 100%  Weight:      Height:       Physical Exam General: drowsy. NAD Heart: RRR Lungs: dim BS poor effort Abdomen: obese soft NT Extremities: no sig LE edema Dialysis Access: right upper AVF  + bruit   Additional Objective Labs: Basic Metabolic Panel: Recent Labs  Lab 12/08/18 1945 12/09/18 1346 12/10/18 0515  NA 140 134* 135  K 7.1* 4.8 4.6  CL 106 97* 93*  CO2 16* 26 26  GLUCOSE 114* 203*  105*  BUN 146* 51* 26*  CREATININE 18.63* 9.81* 6.40*  CALCIUM 8.7* 8.2* 8.8*  PHOS  --  5.2*  --    Liver Function Tests: Recent Labs  Lab 12/08/18 1945 12/09/18 1346  AST 23  --   ALT 14  --   ALKPHOS 77  --   BILITOT 0.7  --   PROT 7.0  --   ALBUMIN 3.0* 2.9*   No results for input(s): LIPASE, AMYLASE in the last 168 hours. CBC: Recent Labs  Lab 12/08/18 1945 12/09/18 1347 12/10/18 0515  WBC 9.0 9.1 7.1  NEUTROABS 7.2  --  4.7  HGB 9.3* 9.7* 11.1*  HCT 30.0* 30.4* 34.5*  MCV 91.2 91.0 92.0  PLT 163 161 168   Blood Culture    Component Value Date/Time   SDES BLOOD LEFT HAND 02/07/2018 2150   SPECREQUEST  02/07/2018 2150    BOTTLES DRAWN AEROBIC AND ANAEROBIC Blood Culture adequate volume   CULT  02/07/2018 2150    NO GROWTH 5 DAYS Performed at [facility], [place] [address], [place], [place] [address]    REPTSTATUS 02/12/2018 FINAL 02/07/2018 2150    Cardiac Enzymes: No results for input(s): CKTOTAL, CKMB, CKMBINDEX, TROPONINI in the last 168 hours. CBG: Recent Labs  Lab 12/10/18 1125 12/10/18 1623 12/10/18 2040 12/10/18 2302 12/11/18 0729  GLUCAP 95 159* 212* 155* 106*   Iron Studies: No results  for input(s): IRON, TIBC, TRANSFERRIN, FERRITIN in the last 72 hours. Lab Results  Component Value Date   INR 1.47 02/07/2018   INR 1.33 07/02/2014   Studies/Results: Dg Chest 2 View  Result Date: 12/10/2018 CLINICAL DATA:  Dyspnea. EXAM: CHEST - 2 VIEW COMPARISON:  Radiograph of December 08, 2018. FINDINGS: Stable cardiomegaly. No pneumothorax or pleural effusion is noted. Significantly decreased bilateral diffuse interstitial densities are noted suggesting significantly improved edema or inflammation. Bony thorax is unremarkable. IMPRESSION: Significantly decreased bilateral diffuse interstitial densities are noted suggesting significantly improved bilateral edema or inflammation. Electronically Signed   By: Marijo Conception, M.D.   On: 12/10/2018 10:58    Medications: . sodium chloride    . calcium gluconate     . aspirin EC  81 mg Oral Daily  . atorvastatin  20 mg Oral Daily  . calcium acetate  667 mg Oral TID WC  . Chlorhexidine Gluconate Cloth  6 each Topical Q0600  . darbepoetin (ARANESP) injection - DIALYSIS  60 mcg Intravenous Q Sat-HD  . insulin aspart  5 Units Intravenous Once   And  . dextrose  1 ampule Intravenous Once  . famotidine  10 mg Oral Daily  . heparin  5,000 Units Subcutaneous BID  . insulin aspart  0-5 Units Subcutaneous QHS  . insulin aspart  0-9 Units Subcutaneous TID WC  . multivitamin  1 tablet Oral Q T,Th,Sa-HD  . multivitamin with minerals  1 tablet Oral Daily  . pantoprazole  20 mg Oral Daily  . PARoxetine  10 mg Oral Daily  . sevelamer carbonate  800 mg Oral TID WC  . sodium chloride flush  3 mL Intravenous Q12H

## 2018-12-12 DIAGNOSIS — E1122 Type 2 diabetes mellitus with diabetic chronic kidney disease: Secondary | ICD-10-CM | POA: Diagnosis not present

## 2018-12-12 DIAGNOSIS — N186 End stage renal disease: Secondary | ICD-10-CM | POA: Diagnosis not present

## 2018-12-12 DIAGNOSIS — Z992 Dependence on renal dialysis: Secondary | ICD-10-CM | POA: Diagnosis not present

## 2018-12-12 LAB — RENAL FUNCTION PANEL
Albumin: 2.9 g/dL — ABNORMAL LOW (ref 3.5–5.0)
Anion gap: 18 — ABNORMAL HIGH (ref 5–15)
BUN: 69 mg/dL — ABNORMAL HIGH (ref 8–23)
CO2: 24 mmol/L (ref 22–32)
Calcium: 8.5 mg/dL — ABNORMAL LOW (ref 8.9–10.3)
Chloride: 95 mmol/L — ABNORMAL LOW (ref 98–111)
Creatinine, Ser: 10.81 mg/dL — ABNORMAL HIGH (ref 0.61–1.24)
GFR calc Af Amer: 5 mL/min — ABNORMAL LOW (ref >60)
GFR calc non Af Amer: 4 mL/min — ABNORMAL LOW (ref >60)
Glucose, Bld: 158 mg/dL — ABNORMAL HIGH (ref 70–99)
Phosphorus: 5.9 mg/dL — ABNORMAL HIGH (ref 2.5–4.6)
Potassium: 5.3 mmol/L — ABNORMAL HIGH (ref 3.5–5.1)
Sodium: 137 mmol/L (ref 135–145)

## 2018-12-12 LAB — CBC
HCT: 34.1 % — ABNORMAL LOW (ref 39.0–52.0)
Hemoglobin: 10.3 g/dL — ABNORMAL LOW (ref 13.0–17.0)
MCH: 27.8 pg (ref 26.0–34.0)
MCHC: 30.2 g/dL (ref 30.0–36.0)
MCV: 92.2 fL (ref 80.0–100.0)
Platelets: 209 10*3/uL (ref 150–400)
RBC: 3.7 MIL/uL — ABNORMAL LOW (ref 4.22–5.81)
RDW: 12.6 % (ref 11.5–15.5)
WBC: 7 10*3/uL (ref 4.0–10.5)
nRBC: 0 % (ref 0.0–0.2)

## 2018-12-12 LAB — GLUCOSE, CAPILLARY
Glucose-Capillary: 172 mg/dL — ABNORMAL HIGH (ref 70–99)
Glucose-Capillary: 139 mg/dL — ABNORMAL HIGH (ref 70–99)
Glucose-Capillary: 176 mg/dL — ABNORMAL HIGH (ref 70–99)

## 2018-12-12 MED ORDER — RENA-VITE PO TABS
1.0000 | ORAL_TABLET | Freq: Every day | ORAL | Status: DC
  Administered 2018-12-12: 1 via ORAL
  Filled 2018-12-12 (×2): qty 1

## 2018-12-12 MED ORDER — PANTOPRAZOLE SODIUM 40 MG PO TBEC
40.0000 mg | DELAYED_RELEASE_TABLET | Freq: Every day | ORAL | Status: DC
  Administered 2018-12-12 – 2018-12-13 (×2): 40 mg via ORAL
  Filled 2018-12-12 (×3): qty 1

## 2018-12-12 MED ORDER — HEPARIN SODIUM (PORCINE) 5000 UNIT/ML IJ SOLN
5000.0000 [IU] | Freq: Three times a day (TID) | INTRAMUSCULAR | Status: DC
  Administered 2018-12-12 – 2018-12-13 (×3): 5000 [IU] via SUBCUTANEOUS
  Filled 2018-12-12 (×3): qty 1

## 2018-12-12 MED ORDER — SODIUM CHLORIDE 0.9 % IV SOLN: 100.0000 mL | INTRAVENOUS | Status: DC | PRN

## 2018-12-12 MED ORDER — LIDOCAINE HCL (PF) 1 % IJ SOLN: 5.0000 mL | INTRAMUSCULAR | Status: DC | PRN

## 2018-12-12 MED ORDER — PENTAFLUOROPROP-TETRAFLUOROETH EX AERO: 1.0000 "application " | INHALATION_SPRAY | CUTANEOUS | Status: DC | PRN

## 2018-12-12 MED ORDER — ALTEPLASE 2 MG IJ SOLR: 2.0000 mg | Freq: Once | INTRAMUSCULAR | Status: DC | PRN

## 2018-12-12 MED ORDER — LIDOCAINE-PRILOCAINE 2.5-2.5 % EX CREA: 1.0000 "application " | TOPICAL_CREAM | CUTANEOUS | Status: DC | PRN

## 2018-12-12 MED ORDER — HEPARIN SODIUM (PORCINE) 1000 UNIT/ML DIALYSIS: 1000.0000 [IU] | INTRAMUSCULAR | Status: DC | PRN

## 2018-12-12 MED ORDER — HEPARIN SODIUM (PORCINE) 1000 UNIT/ML IJ SOLN
INTRAMUSCULAR | Status: AC
  Administered 2018-12-12: 1800 [IU] via INTRAVENOUS_CENTRAL
  Filled 2018-12-12: qty 2

## 2018-12-12 MED ORDER — HEPARIN SODIUM (PORCINE) 1000 UNIT/ML DIALYSIS
20.0000 [IU]/kg | INTRAMUSCULAR | Status: DC | PRN
  Administered 2018-12-12: 1800 [IU] via INTRAVENOUS_CENTRAL
  Filled 2018-12-12: qty 1.8

## 2018-12-12 NOTE — Progress Notes (Addendum)
[facility] Progress Note   Dialysis Orders: Center:[facility] T[facility]. EDW88kgHD Bath 2K/3.5CaTime 4 hoursHeparin 6000. AccessRUE AVFBFR 400DFR 800  Assessment/Plan: 1. Acute hypoxic respiratory failure: In setting of missed HD and pulm edema. CXR 3/29 sig decrease in bilateral diffuse edema 3,5 3/27 and 2.7 3/28 removed post wt 92.4- still above EDW - gets to EDW at outpatient unit when last there 3/21 - needs stand up weight post HD - not stood pre HD - 2. Hyperkalemia: Improved s/p urgent HD 3/27.K 5.3 today - 3. ESRD: TTS on schedule - noted to have scabbed area on AVF - cannulating away from this - needs close f/u after d/c  4. Hypertension/volume: BP much  lower than outpatient BP/not clear why - BP meds d/c - no LE edema- pre HD wt 89.4 - BP 90s - keep out of UF - check orthstatics post HD before pulling needles - will add fluid if necessary 5. Anemia: Hgb 10.3 s/p Aranesp 60 - follow. 6. Metabolic bone disease: Corr Ca/Phos ok. Continuerenvela and sensipar 7. Nutrition- renal diet. 8. Low grade fever on admit: Denied cough or sick contacts, resolved.  9. Noncompliance- concern over his ability to care for himself. MS improved. 10. T2DM: On SSI, per primary.  Myriam Jacobson, PA-C [place] [phone] 12/12/2018,8:59 AM  LOS: 4 days   Pt seen, examined and agree w assess/plan as above with additions as indicated. Unable to pull any fluid off today. Dispo per primary.  [facility] 12/12/2018, 5:39 PM     Subjective:   No c/o - hopes to go home today.  Objective Vitals:   12/12/18 0730 12/12/18 0800 12/12/18 0830 12/12/18 0845  BP: (!) 90/43 (!) 77/48 (!) 88/49 (!) 96/51  Pulse: 74 73 74 75  Resp:      Temp:      TempSrc:      SpO2:      Weight:      Height:       Physical Exam General:alert  NAD Heart: RRR Lungs: dim BS poor effort Abdomen: obese soft NT Extremities: no sig LE  edema Dialysis Access: right upper AVF  Scabbed area - upper AVF - non draining   Additional Objective Labs: Basic Metabolic Panel: Recent Labs  Lab 12/09/18 1346 12/10/18 0515 12/12/18 0733  NA 134* 135 137  K 4.8 4.6 5.3*  CL 97* 93* 95*  CO2 26 26 24   GLUCOSE 203* 105* 158*  BUN 51* 26* 69*  CREATININE 9.81* 6.40* 10.81*  CALCIUM 8.2* 8.8* 8.5*  PHOS 5.2*  --  5.9*   Liver Function Tests: Recent Labs  Lab 12/08/18 1945 12/09/18 1346 12/12/18 0733  AST 23  --   --   ALT 14  --   --   ALKPHOS 77  --   --   BILITOT 0.7  --   --   PROT 7.0  --   --   ALBUMIN 3.0* 2.9* 2.9*   No results for input(s): LIPASE, AMYLASE in the last 168 hours. CBC: Recent Labs  Lab 12/08/18 1945 12/09/18 1347 12/10/18 0515 12/12/18 0734  WBC 9.0 9.1 7.1 7.0  NEUTROABS 7.2  --  4.7  --   HGB 9.3* 9.7* 11.1* 10.3*  HCT 30.0* 30.4* 34.5* 34.1*  MCV 91.2 91.0 92.0 92.2  PLT 163 161 168 209   Blood Culture    Component Value Date/Time   SDES BLOOD LEFT HAND 02/07/2018 2150   SPECREQUEST  02/07/2018 2150    BOTTLES DRAWN AEROBIC AND ANAEROBIC Blood Culture adequate volume   CULT  02/07/2018 2150    NO GROWTH 5 DAYS Performed at [facility], [place] [address] [place], [place] [address]    REPTSTATUS 02/12/2018 FINAL 02/07/2018 2150    Cardiac Enzymes: No results for input(s): CKTOTAL, CKMB, CKMBINDEX, TROPONINI in the last 168 hours. CBG: Recent Labs  Lab 12/10/18 2302 12/11/18 0729 12/11/18 1144 12/11/18 1633 12/11/18 2207  GLUCAP 155* 106* 188* 161* 169*   Iron Studies: No results for input(s): IRON, TIBC, TRANSFERRIN, FERRITIN in the last 72 hours. Lab Results  Component Value Date   INR 1.47 02/07/2018   INR 1.33 07/02/2014   Studies/Results: Dg Chest 2 View  Result Date: 12/10/2018 CLINICAL DATA:  Dyspnea. EXAM: CHEST - 2 VIEW COMPARISON:  Radiograph of December 08, 2018. FINDINGS: Stable cardiomegaly. No pneumothorax or pleural effusion is noted.  Significantly decreased bilateral diffuse interstitial densities are noted suggesting significantly improved edema or inflammation. Bony thorax is unremarkable. IMPRESSION: Significantly decreased bilateral diffuse interstitial densities are noted suggesting significantly improved bilateral edema or inflammation. Electronically Signed   By: Marijo Conception, M.D.   On: 12/10/2018 10:58   Medications: . sodium chloride    . sodium chloride    . sodium chloride    . calcium gluconate     . aspirin EC  81 mg Oral Daily  . atorvastatin  20 mg Oral Daily  . calcium acetate  667 mg Oral TID WC  . Chlorhexidine Gluconate Cloth  6 each Topical Q0600  . Chlorhexidine Gluconate Cloth  6 each Topical Q0600  . darbepoetin (ARANESP) injection - DIALYSIS  60 mcg Intravenous Q Sat-HD  . insulin aspart  5 Units Intravenous Once   And  . dextrose  1 ampule Intravenous Once  . famotidine  10 mg Oral Daily  . heparin      . heparin  5,000 Units Subcutaneous BID  . insulin aspart  0-5 Units Subcutaneous QHS  . insulin aspart  0-9 Units Subcutaneous TID WC  . multivitamin  1 tablet Oral Q T,Th,Sa-HD  . multivitamin with minerals  1 tablet Oral Daily  . pantoprazole  20 mg Oral Daily  . PARoxetine  10 mg Oral Daily  . sevelamer carbonate  800 mg Oral TID WC  . sodium chloride flush  3 mL Intravenous Q12H

## 2018-12-12 NOTE — Progress Notes (Addendum)
[facility] PROGRESS NOTE    Progress Note  Blake [name]  TFT:[id number] DOB: April 14, 1946 DOA: 12/08/2018 PCP: [name], MD     Brief Narrative:   Blake [name] is an 73 y.o. male past medical history of end-stage renal disease, hypertension diabetes mellitus type 2 presents today with worsening weakness and shortness of breath after missing 3 dialysis session.  Assessment/Plan:   Acute pulmonary edema with uremia/end-stage renal disease/Hyperkalemia/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 [facility]): Continue dialysis per nephrology.  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 A1c was 6.5.  Noncompliance and poor insight on his medical condition: Counseling was performed.  Orthostatic hypotension: He is orthostatic, he continues to have nausea every time he sits up. Will discussed with renal to see if midodrine  needed. This morning his blood pressure was 97/46 and dialysis. Currently off antihypertensive medication.   DVT prophylaxis: heparin Family Communication:none Disposition Plan/Barrier to D/C: Unable to determine Code Status:     Code Status Orders  (From admission, onward)         Start     Ordered   12/09/18 0251  Full code  Continuous     12/09/18 0250        Code Status History    Date Active Date Inactive Code Status Order ID Comments User Context   12/09/2018 0250 12/09/2018 0250 Full Code [id number]  Merton Border, MD Inpatient   02/08/2018 0001 02/13/2018 2031 Full Code [id number]  Ivor Costa, MD ED   04/21/2016 0611 04/22/2016 1933 Full Code [id number]  Etta Quill, DO ED   08/04/2015 1912 08/06/2015 1857 Full Code [id number]  Lanae Crumbly, PA-C Inpatient   08/01/2015 1339 08/04/2015 1912 Full Code [id number]  Willia Craze, NP ED   06/28/2014 0924 07/09/2014 2045 Full Code [id number]  Donita Brooks, NP ED   10/17/2012 1623 10/20/2012 2058 Full  Code [id number]  Chauncy Lean, RN Inpatient        IV Access:    Peripheral IV   Procedures and diagnostic studies:   Dg Chest 2 View  Result Date: 12/10/2018 CLINICAL DATA:  Dyspnea. EXAM: CHEST - 2 VIEW COMPARISON:  Radiograph of December 08, 2018. FINDINGS: Stable cardiomegaly. No pneumothorax or pleural effusion is noted. Significantly decreased bilateral diffuse interstitial densities are noted suggesting significantly improved edema or inflammation. Bony thorax is unremarkable. IMPRESSION: Significantly decreased bilateral diffuse interstitial densities are noted suggesting significantly improved bilateral edema or inflammation. Electronically Signed   By: Marijo Conception, M.D.   On: 12/10/2018 10:58     Medical Consultants:    None.  Anti-Infectives:   None  Subjective:    Blake [name] He relates he cont to be dizzy with just sitting up  Objective:    Vitals:   12/12/18 0800 12/12/18 0830 12/12/18 0845 12/12/18 0900  BP: (!) 77/48 (!) 88/49 (!) 96/51 (!) 90/50  Pulse: 73 74 75 73  Resp:      Temp:      TempSrc:      SpO2:      Weight:      Height:        Intake/Output Summary (Last 24 hours) at 12/12/2018 0933 Last data filed at 12/12/2018 0843 Gross per 24 hour  Intake 240 ml  Output 1 ml  Net 239 ml   Filed Weights   12/09/18 1731 12/09/18 2242 12/12/18 5102  Weight: 92.4 kg 92.3 kg 89.4 kg    Exam: General exam: In no acute distress. Respiratory system: Good air movement and clear to auscultation. Cardiovascular system: S1 & S2 heard, RRR.  Gastrointestinal system: Abdomen is nondistended, soft and nontender.  Central nervous system: Alert and oriented. No focal neurological deficits. Extremities: No pedal edema. Skin: No rashes, lesions or ulcers Psychiatry: Judgement and insight appear normal. Mood & affect appropriate.    Data Reviewed:    Labs: Basic Metabolic Panel: Recent Labs  Lab 12/08/18 1945 12/09/18 1346 12/10/18  0515 12/12/18 0733  NA 140 134* 135 137  K 7.1* 4.8 4.6 5.3*  CL 106 97* 93* 95*  CO2 16* 26 26 24   GLUCOSE 114* 203* 105* 158*  BUN 146* 51* 26* 69*  CREATININE 18.63* 9.81* 6.40* 10.81*  CALCIUM 8.7* 8.2* 8.8* 8.5*  PHOS  --  5.2*  --  5.9*   GFR Estimated Creatinine Clearance: 6.8 mL/min (A) (by C-G formula based on SCr of 10.81 mg/dL (H)). Liver Function Tests: Recent Labs  Lab 12/08/18 1945 12/09/18 1346 12/12/18 0733  AST 23  --   --   ALT 14  --   --   ALKPHOS 77  --   --   BILITOT 0.7  --   --   PROT 7.0  --   --   ALBUMIN 3.0* 2.9* 2.9*   No results for input(s): LIPASE, AMYLASE in the last 168 hours. No results for input(s): AMMONIA in the last 168 hours. Coagulation profile No results for input(s): INR, PROTIME in the last 168 hours.  CBC: Recent Labs  Lab 12/08/18 1945 12/09/18 1347 12/10/18 0515 12/12/18 0734  WBC 9.0 9.1 7.1 7.0  NEUTROABS 7.2  --  4.7  --   HGB 9.3* 9.7* 11.1* 10.3*  HCT 30.0* 30.4* 34.5* 34.1*  MCV 91.2 91.0 92.0 92.2  PLT 163 161 168 209   Cardiac Enzymes: No results for input(s): CKTOTAL, CKMB, CKMBINDEX, TROPONINI in the last 168 hours. BNP (last 3 results) No results for input(s): PROBNP in the last 8760 hours. CBG: Recent Labs  Lab 12/10/18 2302 12/11/18 0729 12/11/18 1144 12/11/18 1633 12/11/18 2207  GLUCAP 155* 106* 188* 161* 169*   D-Dimer: No results for input(s): DDIMER in the last 72 hours. Hgb A1c: No results for input(s): HGBA1C in the last 72 hours. Lipid Profile: No results for input(s): CHOL, HDL, LDLCALC, TRIG, CHOLHDL, LDLDIRECT in the last 72 hours. Thyroid function studies: No results for input(s): TSH, T4TOTAL, T3FREE, THYROIDAB in the last 72 hours.  Invalid input(s): FREET3 Anemia work up: No results for input(s): VITAMINB12, FOLATE, FERRITIN, TIBC, IRON, RETICCTPCT in the last 72 hours. Sepsis Labs: Recent Labs  Lab 12/08/18 1945 12/08/18 2010 12/09/18 0337 12/09/18 1347 12/10/18  0515 12/12/18 0734  WBC 9.0  --   --  9.1 7.1 7.0  LATICACIDVEN  --  1.1 1.5  --   --   --    Microbiology Recent Results (from the past 240 hour(s))  MRSA PCR Screening     Status: None   Collection Time: 12/09/18  2:58 AM  Result Value Ref Range Status   MRSA by PCR NEGATIVE NEGATIVE Final    Comment:        The GeneXpert MRSA Assay (FDA approved for NASAL specimens only), is one component of a comprehensive MRSA colonization surveillance program. It is not intended to diagnose MRSA infection nor to guide or monitor treatment for MRSA infections. Performed at  [facility], [place] [address] [place], [place] [id number]      Medications:   . aspirin EC  81 mg Oral Daily  . atorvastatin  20 mg Oral Daily  . calcium acetate  667 mg Oral TID WC  . Chlorhexidine Gluconate Cloth  6 each Topical Q0600  . Chlorhexidine Gluconate Cloth  6 each Topical Q0600  . darbepoetin (ARANESP) injection - DIALYSIS  60 mcg Intravenous Q Sat-HD  . insulin aspart  5 Units Intravenous Once   And  . dextrose  1 ampule Intravenous Once  . famotidine  10 mg Oral Daily  . heparin      . heparin  5,000 Units Subcutaneous Q8H  . insulin aspart  0-5 Units Subcutaneous QHS  . insulin aspart  0-9 Units Subcutaneous TID WC  . multivitamin  1 tablet Oral QHS  . pantoprazole  40 mg Oral Daily  . PARoxetine  10 mg Oral Daily  . sevelamer carbonate  800 mg Oral TID WC  . sodium chloride flush  3 mL Intravenous Q12H   Continuous Infusions: . sodium chloride    . sodium chloride    . sodium chloride    . calcium gluconate        LOS: 4 days   Charlynne Cousins  [facility]  12/12/2018, 9:33 AM

## 2018-12-13 DIAGNOSIS — E8770 Fluid overload, unspecified: Secondary | ICD-10-CM

## 2018-12-13 DIAGNOSIS — R0602 Shortness of breath: Secondary | ICD-10-CM

## 2018-12-13 LAB — GLUCOSE, CAPILLARY
Glucose-Capillary: 132 mg/dL — ABNORMAL HIGH (ref 70–99)
Glucose-Capillary: 150 mg/dL — ABNORMAL HIGH (ref 70–99)

## 2018-12-13 NOTE — Progress Notes (Addendum)
[facility] Progress Note   Dialysis Orders: Center:[facility] TTS. EDW88kgHD Bath 2K/3.5CaTime 4 hoursHeparin 6000. AccessRUE AVFBFR 400DFR 800  Assessment/Plan: 1. Acute hypoxic respiratory failure/ vol overload/ lean body wt loss: In setting of missed HD and pulm edema. CXR 3/29 sig decrease in bilateral diffuse edema 3,5 3/27 and 2.7 3/28 removed post wt 92.4- still above EDW - gets to EDW at outpatient unit when last there 3/21 --Below prior edw not - weights lower with standing scale 85.2 yesterday non orthostatic.  Off BP meds - let EDW drift up to 86 for d/  2. Hyperkalemia: Improved s/p urgent HD 3/27.K 5.3 3/31  3. ESRD: TTS on schedule - noted to have scabbed area on AVF - cannulating away from this - needs close f/u after d/c - will inform HD unit- will inform outpatient HD unit that they must cannulate away from scabbed ares 4. Hypertension/volume: BP much  lower than outpatient BP/not clear why - BP meds d/c - no LE edema- pre HD wt 89.4 - post HD wt standing 85.2  marked lower than pre HD wt with net UF of 216 cc reduced goal due to low BP yesterday 5. Anemia: Hgb 10.3 s/p Aranesp 60 - follow. 6. Metabolic bone disease: Corr Ca/Phos ok. Continuerenvela and sensipar 7. Nutrition- renal diet. 8. Low grade fever on admit: Denied cough or sick contacts, resolved.  9. Noncompliance- concern over his ability to care for himself. MS improved. 10. T2DM: On SSI, per primary.  Myriam Jacobson, PA-C [place] 12/13/2018,9:20 AM  LOS: 5 days   Pt seen, examined and agree w A/P as above.  [facility] 12/13/2018, 3:29 PM     Subjective:   No c/o -feels great this am.  Looking forward to going home and back to home HD unit. Thinks scabbed areas on access due to staff not rotating sites.  Objective Vitals:   12/12/18 1703 12/12/18 2024 12/13/18 0507 12/13/18 0600  BP: (!) 114/50 (!) 122/47 (!)  126/50   Pulse: 88 85 73   Resp: 18 18 18    Temp: 98.3 F (36.8 C) 99.5 F (37.5 C) 98.2 F (36.8 C)   TempSrc: Oral Oral Oral   SpO2: 92% 96% (!) 89% 92%  Weight:  86.2 kg    Height:       Physical Exam General:alert  NAD Heart: RRR Lungs: no rales Abdomen: obese soft NT Extremities: no sig LE edema Dialysis Access: right upper AVF  Bilateral scabbed area upper moreso+ bruit   Additional Objective Labs: Basic Metabolic Panel: Recent Labs  Lab 12/09/18 1346 12/10/18 0515 12/12/18 0733  NA 134* 135 137  K 4.8 4.6 5.3*  CL 97* 93* 95*  CO2 26 26 24   GLUCOSE 203* 105* 158*  BUN 51* 26* 69*  CREATININE 9.81* 6.40* 10.81*  CALCIUM 8.2* 8.8* 8.5*  PHOS 5.2*  --  5.9*   Liver Function Tests: Recent Labs  Lab 12/08/18 1945 12/09/18 1346 12/12/18 0733  AST 23  --   --   ALT 14  --   --   ALKPHOS 77  --   --   BILITOT 0.7  --   --   PROT 7.0  --   --   ALBUMIN 3.0* 2.9* 2.9*   No results for input(s): LIPASE, AMYLASE in the last 168 hours. CBC: Recent Labs  Lab 12/08/18 1945 12/09/18 1347 12/10/18 0515 12/12/18 0734  WBC 9.0 9.1 7.1 7.0  NEUTROABS 7.2  --  4.7  --   HGB 9.3* 9.7* 11.1* 10.3*  HCT 30.0* 30.4* 34.5* 34.1*  MCV 91.2 91.0 92.0 92.2  PLT 163 161 168 209   Blood Culture    Component Value Date/Time   SDES BLOOD LEFT HAND 02/07/2018 2150   SPECREQUEST  02/07/2018 2150    BOTTLES DRAWN AEROBIC AND ANAEROBIC Blood Culture adequate volume   CULT  02/07/2018 2150    NO GROWTH 5 DAYS Performed at [facility], [place] [address] [place], [place] [address]    REPTSTATUS 02/12/2018 FINAL 02/07/2018 2150    Cardiac Enzymes: No results for input(s): CKTOTAL, CKMB, CKMBINDEX, TROPONINI in the last 168 hours. CBG: Recent Labs  Lab 12/11/18 2207 12/12/18 1220 12/12/18 1705 12/12/18 2023 12/13/18 0634  GLUCAP 169* 172* 139* 176* 132*   Iron Studies: No results for input(s): IRON, TIBC, TRANSFERRIN, FERRITIN in the last 72 hours. Lab  Results  Component Value Date   INR 1.47 02/07/2018   INR 1.33 07/02/2014   Studies/Results: No results found. Medications: . sodium chloride    . calcium gluconate     . aspirin EC  81 mg Oral Daily  . atorvastatin  20 mg Oral Daily  . calcium acetate  667 mg Oral TID WC  . Chlorhexidine Gluconate Cloth  6 each Topical Q0600  . Chlorhexidine Gluconate Cloth  6 each Topical Q0600  . darbepoetin (ARANESP) injection - DIALYSIS  60 mcg Intravenous Q Sat-HD  . insulin aspart  5 Units Intravenous Once   And  . dextrose  1 ampule Intravenous Once  . famotidine  10 mg Oral Daily  . heparin  5,000 Units Subcutaneous Q8H  . insulin aspart  0-5 Units Subcutaneous QHS  . insulin aspart  0-9 Units Subcutaneous TID WC  . multivitamin  1 tablet Oral QHS  . pantoprazole  40 mg Oral Daily  . PARoxetine  10 mg Oral Daily  . sevelamer carbonate  800 mg Oral TID WC  . sodium chloride flush  3 mL Intravenous Q12H

## 2018-12-13 NOTE — Progress Notes (Signed)
[name] to be D/C'd Home per MD order.  Discussed prescriptions and follow up appointments with the patient. Prescriptions given to patient, medication list explained in detail. Pt verbalized understanding.  Allergies as of 12/13/2018   No Known Allergies     Medication List    STOP taking these medications   amLODipine 10 MG tablet Commonly known as:  NORVASC     TAKE these medications   aspirin EC 81 MG tablet Take 81 mg by mouth daily.   atorvastatin 20 MG tablet Commonly known as:  LIPITOR Take 20 mg by mouth at bedtime.   Calcium Acetate 667 MG Tabs Take 667 mg by mouth 3 (three) times daily with meals.   cinacalcet 30 MG tablet Commonly known as:  SENSIPAR Take 30 mg by mouth See admin instructions. Take one tablet (30 mg) by mouth on Tuesday and Saturday with supper   insulin regular 100 units/mL injection Commonly known as:  NovoLIN R ReliOn Inject 0-0.09 mLs (0-9 Units total) into the skin 3 (three) times daily with meals. CBG < 70: implement hypoglycemia protocol CBG 70 - 120: 0 units CBG 121 - 150: 1 unit CBG 151 - 200: 2 units CBG 201 - 250: 3 units CBG 251 - 300: 5 units CBG 301 - 350: 7 units CBG 351 - 400: 9 units CBG > 400: call MD.   Nephro Vitamins 0.8 MG Tabs Take 0.8 mg by mouth daily.   pantoprazole 40 MG tablet Commonly known as:  PROTONIX Take 40 mg by mouth daily.   sevelamer carbonate 800 MG tablet Commonly known as:  RENVELA Take 1 tablet (800 mg total) by mouth 3 (three) times daily with meals. What changed:    when to take this  additional instructions       Vitals:   12/13/18 0600 12/13/18 0855  BP:  115/61  Pulse:  79  Resp:  19  Temp:  98 F (36.7 C)  SpO2: 92% 95%    Skin clean, dry and intact without evidence of skin break down, no evidence of skin tears noted. IV catheter discontinued intact. Site without signs and symptoms of complications. Dressing and pressure applied. Pt denies pain at this time. No  complaints noted.  An After Visit Summary was printed and given to the patient. Patient escorted via [place], and D/C home via private auto.

## 2018-12-13 NOTE — Discharge Instructions (Signed)
Follow with [name], MD in 5-7 days  Please get a complete blood count and chemistry panel checked by your Primary MD at your next visit, and again as instructed by your Primary MD. Please get your medications reviewed and adjusted by your Primary MD.  Please request your Primary MD to go over all Hospital Tests and Procedure/Radiological results at the follow up, please get all Hospital records sent to your Prim MD by signing hospital release before you go home.  In some cases, there will be blood work, cultures and biopsy results pending at the time of your discharge. Please request that your primary care M.D. goes through all the records of your hospital data and follows up on these results.  If you had Pneumonia of Lung problems at the Hospital: Please get a 2 view Chest X ray done in 6-8 weeks after hospital discharge or sooner if instructed by your Primary MD.  If you have Congestive Heart Failure: Please call your Cardiologist or Primary MD anytime you have any of the following symptoms:  1) 3 pound weight gain in 24 hours or 5 pounds in 1 week  2) shortness of breath, with or without a dry hacking cough  3) swelling in the hands, feet or stomach  4) if you have to sleep on extra pillows at night in order to breathe  Follow cardiac low salt diet and 1.5 lit/day fluid restriction.  If you have diabetes Accuchecks 4 times/day, Once in AM empty stomach and then before each meal. Log in all results and show them to your primary doctor at your next visit. If any glucose reading is under 80 or above 300 call your primary MD immediately.  If you have Seizure/Convulsions/Epilepsy: Please do not drive, operate heavy machinery, participate in activities at heights or participate in high speed sports until you have seen by Primary MD or a Neurologist and advised to do so again.  If you had Gastrointestinal Bleeding: Please ask your Primary MD to check a complete blood count  within one week of discharge or at your next visit. Your endoscopic/colonoscopic biopsies that are pending at the time of discharge, will also need to followed by your Primary MD.  Get Medicines reviewed and adjusted. Please take all your medications with you for your next visit with your Primary MD  Please request your Primary MD to go over all hospital tests and procedure/radiological results at the follow up, please ask your Primary MD to get all Hospital records sent to his/her office.  If you experience worsening of your admission symptoms, develop shortness of breath, life threatening emergency, suicidal or homicidal thoughts you must seek medical attention immediately by calling 911 or calling your MD immediately  if symptoms less severe.  You must read complete instructions/literature along with all the possible adverse reactions/side effects for all the Medicines you take and that have been prescribed to you. Take any new Medicines after you have completely understood and accpet all the possible adverse reactions/side effects.   Do not drive or operate heavy machinery when taking Pain medications.   Do not take more than prescribed Pain, Sleep and Anxiety Medications  Special Instructions: If you have smoked or chewed Tobacco  in the last 2 yrs please stop smoking, stop any regular Alcohol  and or any Recreational drug use.  Wear Seat belts while driving.  Please note You were cared for by a hospitalist during your hospital stay. If you have any questions about your discharge  medications or the care you received while you were in the hospital after you are discharged, you can call the unit and asked to speak with the hospitalist on call if the hospitalist that took care of you is not available. Once you are discharged, your primary care physician will handle any further medical issues. Please note that NO REFILLS for any discharge medications will be authorized once you are discharged,  as it is imperative that you return to your primary care physician (or establish a relationship with a primary care physician if you do not have one) for your aftercare needs so that they can reassess your need for medications and monitor your lab values.  You can reach the hospitalist office at phone [phone] or fax [phone]   If you do not have a primary care physician, you can call [phone] for a physician referral.  Activity: As tolerated with Full fall precautions use walker/cane & assistance as needed    Diet: renal  Disposition Home

## 2018-12-13 NOTE — Discharge Summary (Signed)
Physician Discharge Summary  Blake [name] WFU:[id number] DOB: 1946/03/19 DOA: 12/08/2018  PCP: [name], MD  Admit date: 12/08/2018 Discharge date: 12/13/2018  Admitted From: home Disposition:  home  Recommendations for Outpatient Follow-up:  1. Follow up with PCP in 1-2 weeks 2. Continue HD as scheduled   Home Health: none Equipment/Devices: none  Discharge Condition: stable CODE STATUS: Full code Diet recommendation: renal, diabetic   HPI: Per admitting MD, Blake [name]  is a 73 y.o. male, with past medical history significant for ESRD, hyperlipidemia hypertension and diabetes mellitus type 2 presenting today with worsening weakness and shortness of breath after missing 3 dialysis sessions.  Patient denies any chest pains, fever, or chills.  Denies any nausea vomiting or diarrhea.  He missed his dialysis because he could not get his medications straight.  Nephrology was consulted. Work-up in the emergency room showed interstitial edema by chest x-ray and blood work showed worsening uremia with normal WBC.  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  He is back to baseline, he is on room air, ambulatory and will be discharged home in stable condition. Continue dialysis per nephrology.  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   atorvastatin 20 MG tablet Commonly known as:  LIPITOR Take 20 mg by mouth at bedtime.   Calcium Acetate 667 MG Tabs Take 667 mg by mouth 3 (three) times daily with meals.   cinacalcet 30 MG tablet Commonly known as:  SENSIPAR Take 30 mg by mouth See admin instructions. Take one tablet (30 mg) by mouth on Tuesday and Saturday with supper   insulin regular 100 units/mL injection Commonly known as:  NovoLIN R ReliOn Inject 0-0.09 mLs (0-9 Units total) into the skin 3 (three) times daily with meals. CBG < 70: implement hypoglycemia protocol CBG 70 - 120: 0 units CBG 121 - 150: 1 unit CBG 151 - 200: 2 units CBG 201 - 250: 3 units CBG 251 - 300: 5 units CBG 301 - 350: 7 units CBG 351 - 400: 9 units CBG > 400: call MD.   Nephro Vitamins 0.8 MG Tabs Take 0.8 mg by mouth daily.   pantoprazole 40 MG tablet Commonly known as:  PROTONIX Take 40 mg by mouth daily.   sevelamer carbonate 800 MG tablet Commonly known as:  RENVELA Take 1 tablet (800 mg total) by mouth 3 (three) times daily with meals. What changed:    when  to take this  additional instructions      Follow-up Information    [name], MD. Schedule an appointment as soon as possible for a visit in 4 week(s).   Specialty:  Internal Medicine Contact information: [address] [place] [address] [phone]           Consultations:  Nephrology   Procedures/Studies:  HD  Dg Chest 2 View  Result Date: 12/10/2018 CLINICAL DATA:  Dyspnea. EXAM: CHEST - 2 VIEW COMPARISON:  Radiograph of December 08, 2018.  FINDINGS: Stable cardiomegaly. No pneumothorax or pleural effusion is noted. Significantly decreased bilateral diffuse interstitial densities are noted suggesting significantly improved edema or inflammation. Bony thorax is unremarkable. IMPRESSION: Significantly decreased bilateral diffuse interstitial densities are noted suggesting significantly improved bilateral edema or inflammation. Electronically Signed   By: Marijo Conception, M.D.   On: 12/10/2018 10:58   Dg Chest Portable 1 View  Result Date: 12/08/2018 CLINICAL DATA:  Shortness of breath. Hypoxia. Chills. Missed 2 dialysis appointments. Off meds for 2 weeks. History of end-stage renal disease. Crackles on exam. EXAM: PORTABLE CHEST 1 VIEW COMPARISON:  02/07/2018 FINDINGS: Cardiomegaly is similar to prior exam. Diffuse interstitial opacities with slight progression from 2019. Small bilateral pleural effusions and bibasilar opacities. No pneumothorax. Probable calcified granuloma in the right lung. IMPRESSION: 1. Cardiomegaly and small pleural effusions. 2. Diffusely increased interstitial opacities, progressed from 2019 radiograph. Differential considerations include pulmonary edema versus interstitial lung disease. Electronically Signed   By: Keith Rake M.D.   On: 12/08/2018 20:16      Subjective: - no chest pain, shortness of breath, no abdominal pain, nausea or vomiting.   Discharge Exam: BP (!) 126/50 (BP Location: Left Arm)   Pulse 73   Temp 98.2 F (36.8 C) (Oral)   Resp 18   Ht 5\' 8"  (1.727 m)   Wt 86.2 kg   SpO2 92%   BMI 28.89 kg/m   General: Pt is alert, awake, not in acute distress Cardiovascular: RRR, S1/S2 +, no rubs, no gallops Respiratory: CTA bilaterally, no wheezing, no rhonchi Abdominal: Soft, NT, ND, bowel sounds + Extremities: no edema, no cyanosis    The results of significant diagnostics from this hospitalization (including imaging, microbiology, ancillary and laboratory) are listed below for  reference.     Microbiology: Recent Results (from the past 240 hour(s))  MRSA PCR Screening     Status: None   Collection Time: 12/09/18  2:58 AM  Result Value Ref Range Status   MRSA by PCR NEGATIVE NEGATIVE Final    Comment:        The GeneXpert MRSA Assay (FDA approved for NASAL specimens only), is one component of a comprehensive MRSA colonization surveillance program. It is not intended to diagnose MRSA infection nor to guide or monitor treatment for MRSA infections. Performed at [facility], [place] [address], [place], [place] [address]      Labs: BNP (last 3 results) No results for input(s): BNP in the last 8760 hours. Basic Metabolic Panel: Recent Labs  Lab 12/08/18 1945 12/09/18 1346 12/10/18 0515 12/12/18 0733  NA 140 134* 135 137  K 7.1* 4.8 4.6 5.3*  CL 106 97* 93* 95*  CO2 16* 26 26 24   GLUCOSE 114* 203* 105* 158*  BUN 146* 51* 26* 69*  CREATININE 18.63* 9.81* 6.40* 10.81*  CALCIUM 8.7* 8.2* 8.8* 8.5*  PHOS  --  5.2*  --  5.9*   Liver Function Tests: Recent Labs  Lab 12/08/18 1945 12/09/18 1346 12/12/18 0733  AST 23  --   --   ALT 14  --   --   ALKPHOS 77  --   --   BILITOT 0.7  --   --   PROT 7.0  --   --   ALBUMIN 3.0* 2.9* 2.9*   No results for input(s): LIPASE, AMYLASE in the last 168 hours. No results for input(s): AMMONIA in the last 168 hours. CBC: Recent Labs  Lab 12/08/18 1945 12/09/18 1347 12/10/18 0515 12/12/18 0734  WBC 9.0 9.1 7.1 7.0  NEUTROABS 7.2  --  4.7  --   HGB 9.3* 9.7* 11.1* 10.3*  HCT 30.0* 30.4* 34.5* 34.1*  MCV 91.2 91.0 92.0 92.2  PLT 163 161 168 209   Cardiac Enzymes: No results for input(s): CKTOTAL, CKMB, CKMBINDEX, TROPONINI in the last 168 hours. BNP: Invalid input(s): POCBNP CBG: Recent Labs  Lab 12/11/18 2207 12/12/18 1220 12/12/18 1705 12/12/18 2023 12/13/18 0634  GLUCAP 169* 172* 139* 176* 132*   D-Dimer No results for input(s): DDIMER in the last 72 hours. Hgb A1c No results  for input(s): HGBA1C in the last 72 hours. Lipid Profile No results for input(s): CHOL, HDL, LDLCALC, TRIG, CHOLHDL, LDLDIRECT in the last 72 hours. Thyroid function studies No results for input(s): TSH, T4TOTAL, T3FREE, THYROIDAB in the last 72 hours.  Invalid input(s): FREET3 Anemia work up No results for input(s): VITAMINB12, FOLATE, FERRITIN, TIBC, IRON, RETICCTPCT in the last 72 hours. Urinalysis    Component Value Date/Time   COLORURINE YELLOW 08/01/2015 0642   APPEARANCEUR CLEAR 08/01/2015 0642   LABSPEC 1.013 08/01/2015 0642   PHURINE 8.0 08/01/2015 0642   GLUCOSEU 100 (A) 08/01/2015 0642   HGBUR MODERATE (A) 08/01/2015 0642   BILIRUBINUR NEGATIVE 08/01/2015 0642   KETONESUR NEGATIVE 08/01/2015 0642   PROTEINUR 100 (A) 08/01/2015 0642   UROBILINOGEN 0.2 06/28/2014 1417   NITRITE NEGATIVE 08/01/2015 0642   LEUKOCYTESUR NEGATIVE 08/01/2015 0642   Sepsis Labs Invalid input(s): PROCALCITONIN,  WBC,  LACTICIDVEN  FURTHER DISCHARGE INSTRUCTIONS:   Get Medicines reviewed and adjusted: Please take all your medications with you for your next visit with your Primary MD   Laboratory/radiological data: Please request your Primary MD to go over all hospital tests and procedure/radiological results at the follow up, please ask your Primary MD to get all Hospital records sent to his/her office.   In some cases, they will be blood work, cultures and biopsy results pending at the time of your discharge. Please request that your primary care M.D. goes through all the records of your hospital data and follows up on these results.   Also Note the following: If you experience worsening of your admission symptoms, develop shortness of breath, life threatening emergency, suicidal or homicidal thoughts you must seek medical attention immediately by calling 911 or calling your MD immediately  if symptoms less severe.   You must read complete instructions/literature along with all the possible  adverse reactions/side effects for all the Medicines you take and that have been prescribed to you. Take any new Medicines after you have completely understood and accpet all the possible adverse reactions/side effects.    Do not drive when taking Pain medications or sleeping medications (Benzodaizepines)   Do not take more than prescribed Pain, Sleep and Anxiety Medications. It is not advisable to combine anxiety,sleep and pain medications without talking with your primary care practitioner   Special Instructions: If you have smoked or chewed Tobacco  in the last 2 yrs please stop smoking, stop any regular Alcohol  and or any Recreational drug use.   Wear Seat belts while driving.   Please note: You were cared for by a hospitalist during your hospital stay. Once you are discharged, your primary care physician will handle any further medical issues. Please note that NO REFILLS for any discharge medications will be authorized once you are discharged, as it is imperative that you return to your primary care physician (or establish a relationship with a primary care physician if you do not have one) for your post hospital discharge needs so that they can reassess your need for medications and monitor your lab values.  Time coordinating discharge: 35 minutes  SIGNED:  Marzetta Board, PA-S 12/13/2018, 8:54 AM

## 2018-12-16 DIAGNOSIS — N186 End stage renal disease: Secondary | ICD-10-CM | POA: Diagnosis not present

## 2018-12-16 DIAGNOSIS — E1129 Type 2 diabetes mellitus with other diabetic kidney complication: Secondary | ICD-10-CM | POA: Diagnosis not present

## 2018-12-16 DIAGNOSIS — D689 Coagulation defect, unspecified: Secondary | ICD-10-CM | POA: Diagnosis not present

## 2018-12-16 DIAGNOSIS — D631 Anemia in chronic kidney disease: Secondary | ICD-10-CM | POA: Diagnosis not present

## 2018-12-16 DIAGNOSIS — N2581 Secondary hyperparathyroidism of renal origin: Secondary | ICD-10-CM | POA: Diagnosis not present

## 2018-12-19 DIAGNOSIS — D631 Anemia in chronic kidney disease: Secondary | ICD-10-CM | POA: Diagnosis not present

## 2018-12-19 DIAGNOSIS — N2581 Secondary hyperparathyroidism of renal origin: Secondary | ICD-10-CM | POA: Diagnosis not present

## 2018-12-19 DIAGNOSIS — N186 End stage renal disease: Secondary | ICD-10-CM | POA: Diagnosis not present

## 2018-12-19 DIAGNOSIS — D689 Coagulation defect, unspecified: Secondary | ICD-10-CM | POA: Diagnosis not present

## 2018-12-19 DIAGNOSIS — E1129 Type 2 diabetes mellitus with other diabetic kidney complication: Secondary | ICD-10-CM | POA: Diagnosis not present

## 2018-12-21 DIAGNOSIS — E1129 Type 2 diabetes mellitus with other diabetic kidney complication: Secondary | ICD-10-CM | POA: Diagnosis not present

## 2018-12-21 DIAGNOSIS — N2581 Secondary hyperparathyroidism of renal origin: Secondary | ICD-10-CM | POA: Diagnosis not present

## 2018-12-21 DIAGNOSIS — N186 End stage renal disease: Secondary | ICD-10-CM | POA: Diagnosis not present

## 2018-12-21 DIAGNOSIS — D689 Coagulation defect, unspecified: Secondary | ICD-10-CM | POA: Diagnosis not present

## 2018-12-21 DIAGNOSIS — D631 Anemia in chronic kidney disease: Secondary | ICD-10-CM | POA: Diagnosis not present

## 2018-12-22 ENCOUNTER — Other Ambulatory Visit: Payer: Self-pay

## 2018-12-22 ENCOUNTER — Emergency Department (HOSPITAL_COMMUNITY)
Admission: EM | Admit: 2018-12-22 | Discharge: 2018-12-22 | Disposition: A | Discharge status: Home / Self Care | Payer: Medicare Other | Attending: Emergency Medicine | Admitting: Emergency Medicine

## 2018-12-22 ENCOUNTER — Emergency Department (HOSPITAL_COMMUNITY): Payer: Medicare Other

## 2018-12-22 ENCOUNTER — Encounter (HOSPITAL_COMMUNITY): Payer: Self-pay | Admitting: Emergency Medicine

## 2018-12-22 DIAGNOSIS — E119 Type 2 diabetes mellitus without complications: Secondary | ICD-10-CM | POA: Insufficient documentation

## 2018-12-22 DIAGNOSIS — N186 End stage renal disease: Secondary | ICD-10-CM | POA: Diagnosis not present

## 2018-12-22 DIAGNOSIS — I120 Hypertensive chronic kidney disease with stage 5 chronic kidney disease or end stage renal disease: Secondary | ICD-10-CM | POA: Diagnosis not present

## 2018-12-22 DIAGNOSIS — I10 Essential (primary) hypertension: Secondary | ICD-10-CM | POA: Diagnosis not present

## 2018-12-22 DIAGNOSIS — Z87891 Personal history of nicotine dependence: Secondary | ICD-10-CM | POA: Insufficient documentation

## 2018-12-22 DIAGNOSIS — R112 Nausea with vomiting, unspecified: Secondary | ICD-10-CM | POA: Diagnosis not present

## 2018-12-22 DIAGNOSIS — Z79899 Other long term (current) drug therapy: Secondary | ICD-10-CM | POA: Insufficient documentation

## 2018-12-22 DIAGNOSIS — Z7982 Long term (current) use of aspirin: Secondary | ICD-10-CM | POA: Diagnosis not present

## 2018-12-22 DIAGNOSIS — R1111 Vomiting without nausea: Secondary | ICD-10-CM | POA: Diagnosis not present

## 2018-12-22 DIAGNOSIS — R1110 Vomiting, unspecified: Secondary | ICD-10-CM | POA: Diagnosis not present

## 2018-12-22 DIAGNOSIS — Z794 Long term (current) use of insulin: Secondary | ICD-10-CM | POA: Diagnosis not present

## 2018-12-22 DIAGNOSIS — R0602 Shortness of breath: Secondary | ICD-10-CM | POA: Diagnosis not present

## 2018-12-22 DIAGNOSIS — R001 Bradycardia, unspecified: Secondary | ICD-10-CM | POA: Diagnosis not present

## 2018-12-22 LAB — CBC WITH DIFFERENTIAL/PLATELET
Abs Immature Granulocytes: 0.04 10*3/uL (ref 0.00–0.07)
Basophils Absolute: 0 10*3/uL (ref 0.0–0.1)
Basophils Relative: 0 %
Eosinophils Absolute: 0.2 10*3/uL (ref 0.0–0.5)
Eosinophils Relative: 2 %
HCT: 35.5 % — ABNORMAL LOW (ref 39.0–52.0)
Hemoglobin: 10.9 g/dL — ABNORMAL LOW (ref 13.0–17.0)
Immature Granulocytes: 0 %
Lymphocytes Relative: 10 %
Lymphs Abs: 0.9 10*3/uL (ref 0.7–4.0)
MCH: 28.4 pg (ref 26.0–34.0)
MCHC: 30.7 g/dL (ref 30.0–36.0)
MCV: 92.4 fL (ref 80.0–100.0)
Monocytes Absolute: 1 10*3/uL (ref 0.1–1.0)
Monocytes Relative: 12 %
Neutro Abs: 6.7 10*3/uL (ref 1.7–7.7)
Neutrophils Relative %: 76 %
Platelets: 218 10*3/uL (ref 150–400)
RBC: 3.84 MIL/uL — ABNORMAL LOW (ref 4.22–5.81)
RDW: 12.9 % (ref 11.5–15.5)
WBC: 8.9 10*3/uL (ref 4.0–10.5)
nRBC: 0 % (ref 0.0–0.2)

## 2018-12-22 LAB — COMPREHENSIVE METABOLIC PANEL
ALT: 27 U/L (ref 0–44)
AST: 22 U/L (ref 15–41)
Albumin: 3.3 g/dL — ABNORMAL LOW (ref 3.5–5.0)
Alkaline Phosphatase: 90 U/L (ref 38–126)
Anion gap: 18 — ABNORMAL HIGH (ref 5–15)
BUN: 29 mg/dL — ABNORMAL HIGH (ref 8–23)
CO2: 26 mmol/L (ref 22–32)
Calcium: 10.1 mg/dL (ref 8.9–10.3)
Chloride: 95 mmol/L — ABNORMAL LOW (ref 98–111)
Creatinine, Ser: 6.19 mg/dL — ABNORMAL HIGH (ref 0.61–1.24)
GFR calc Af Amer: 10 mL/min — ABNORMAL LOW (ref >60)
GFR calc non Af Amer: 8 mL/min — ABNORMAL LOW (ref >60)
Glucose, Bld: 198 mg/dL — ABNORMAL HIGH (ref 70–99)
Potassium: 4.4 mmol/L (ref 3.5–5.1)
Sodium: 139 mmol/L (ref 135–145)
Total Bilirubin: 0.7 mg/dL (ref 0.3–1.2)
Total Protein: 8 g/dL (ref 6.5–8.1)

## 2018-12-22 LAB — TROPONIN I: Troponin I: <0.03 ng/mL (ref <0.03)

## 2018-12-22 LAB — LIPASE, BLOOD: Lipase: 49 U/L (ref 11–51)

## 2018-12-22 MED ORDER — SODIUM CHLORIDE 0.9 % IV BOLUS
250.0000 mL | Freq: Once | INTRAVENOUS | Status: AC
  Administered 2018-12-22: 250 mL via INTRAVENOUS

## 2018-12-22 MED ORDER — ONDANSETRON HCL 4 MG PO TABS: 4.0000 mg | ORAL_TABLET | Freq: Four times a day (QID) | ORAL | 0 refills | Status: DC

## 2018-12-22 MED ORDER — IOHEXOL 300 MG/ML  SOLN
100.0000 mL | Freq: Once | INTRAMUSCULAR | Status: AC | PRN
  Administered 2018-12-22: 100 mL via INTRAVENOUS

## 2018-12-22 MED ORDER — ONDANSETRON HCL 4 MG/2ML IJ SOLN
4.0000 mg | Freq: Once | INTRAMUSCULAR | Status: AC
  Administered 2018-12-22: 4 mg via INTRAVENOUS
  Filled 2018-12-22: qty 2

## 2018-12-22 MED ORDER — METOCLOPRAMIDE HCL 5 MG/ML IJ SOLN
5.0000 mg | Freq: Once | INTRAMUSCULAR | Status: AC
  Administered 2018-12-22: 5 mg via INTRAVENOUS
  Filled 2018-12-22: qty 2

## 2018-12-22 NOTE — ED Provider Notes (Signed)
[facility] EMERGENCY DEPARTMENT Provider Note   CSN: [id number] Arrival date & time: 12/22/18  0023    History   Chief Complaint Chief Complaint  Patient presents with   Nausea   Emesis    HPI Blake [name] is a 73 y.o. male.     Patient presents to the emergency department from home for evaluation of nausea and vomiting.  Symptoms began this evening.  Patient reports that he felt well through the course of the day.  He went to dialysis and had a normal dialysis session.  He has had at least 5 episodes of vomiting at home tonight.  No hematemesis.  He has not had any diarrhea, rectal bleeding or melena.  Patient denies fever, chest pain, cough, shortness of breath.     Past Medical History:  Diagnosis Date   Acute osteomyelitis of toe of right foot ([place]) 08/02/2015   Arthritis    "maybe RUE/hand" (02/08/2018)   ESRD (end stage renal disease) on dialysis ([place])    "TTS; [place]" (02/08/2018)   Hyperlipidemia    Hypertension    Type II diabetes mellitus ([place])     Patient Active Problem List   Diagnosis Date Noted   Fluid overload 12/08/2018   Chronic respiratory failure with hypoxia ([place]) 02/24/2018   Dyslipidemia associated with type 2 diabetes mellitus ([place]) 02/14/2018   Type 2 diabetes mellitus with hypertension and end stage renal disease on dialysis ([place]) 02/14/2018   GERD without esophagitis 02/14/2018   Hypomagnesemia 02/08/2018   Thrombocytopenia ([place]) 02/07/2018   Sepsis ([place]) 02/07/2018   Hypocalcemia 02/07/2018   Hypokalemia 02/07/2018   Nausea and vomiting 04/21/2016   Hyperkalemia 04/21/2016   Encephalopathy, metabolic 16/38/4665   Acute pulmonary edema ([place])    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 ([place]) 07/07/2014   Depression 07/07/2014   Neuropathic pain, leg, bilateral 07/07/2014   Diabetic retinopathy ([place]) 07/07/2014   Acute respiratory failure ([place]) 06/28/2014     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 ([place]) 10/17/2012   Hypoxia 10/17/2012   Hypertension     Past Surgical History:  Procedure Laterality Date   AMPUTATION TOE Right 08/04/2015   Procedure: RIGHT GREAT TOE AMPUTATION;  Surgeon: [name], MD;  Location: [place];  Service: Orthopedics;  Laterality: Right;   AV FISTULA PLACEMENT Right 07/05/2014   Procedure: ARTERIOVENOUS BRACHIALCEPHALIC (AV) FISTULA CREATION;  Surgeon: Conrad Bairoa La Ve[place], MD;  Location: [place];  Service: Vascular;  Laterality: Right;   BACK SURGERY     ESOPHAGOGASTRODUODENOSCOPY (EGD) WITH PROPOFOL N/A 11/28/2015   Procedure: ESOPHAGOGASTRODUODENOSCOPY (EGD) WITH PROPOFOL;  Surgeon: [name], MD;  Location: [facility];  Service: Endoscopy;  Laterality: N/A;   INSERTION OF DIALYSIS CATHETER Left 07/05/2014   Procedure: INSERTION OF DIALYSIS CATHETER-LEFT INTERNAL JUGULAR PLACEMENT;  Surgeon: Conrad Speed, MD;  Location: [place] [facility];  Service: Vascular;  Laterality: Left;   LUMBAR DISC SURGERY     REMOVAL OF A DIALYSIS CATHETER Right 07/05/2014   Procedure: REMOVAL OF A DIALYSIS CATHETER;  Surgeon: Conrad Warsaw, MD;  Location: [place];  Service: Vascular;  Laterality: Right;        Home Medications    Prior to Admission medications   Medication Sig Start Date End Date Taking? Authorizing Provider  aspirin EC 81 MG tablet Take 81 mg by mouth daily.    [provider]  atorvastatin (LIPITOR) 20 MG tablet Take 20 mg by mouth at  bedtime.     [provider]  B Complex-C-Folic Acid (NEPHRO VITAMINS) 0.8 MG TABS Take 0.8 mg by mouth daily.     [provider]  Calcium Acetate 667 MG TABS Take 667 mg by mouth 3 (three) times daily with meals.  02/14/18   [provider]  cinacalcet (SENSIPAR) 30 MG tablet Take 30 mg by mouth See admin instructions. Take one tablet (30 mg) by mouth on Tuesday and Saturday with supper 11/23/18   [provider]  insulin regular (NOVOLIN R RELION) 100  units/mL injection Inject 0-0.09 mLs (0-9 Units total) into the skin 3 (three) times daily with meals. CBG < 70: implement hypoglycemia protocol CBG 70 - 120: 0 units CBG 121 - 150: 1 unit CBG 151 - 200: 2 units CBG 201 - 250: 3 units CBG 251 - 300: 5 units CBG 301 - 350: 7 units CBG 351 - 400: 9 units CBG > 400: call MD. 02/13/18   [name], MD  pantoprazole (PROTONIX) 40 MG tablet Take 40 mg by mouth daily.     [provider]  sevelamer carbonate (RENVELA) 800 MG tablet Take 1 tablet (800 mg total) by mouth 3 (three) times daily with meals. Patient taking differently: Take 800 mg by mouth 5 (five) times daily. 1 tablet three times daily with meals and two times daily with snacks 08/23/14   Conrad [place], MD    Family History Family History  Problem Relation Age of Onset   Diabetes Father    Heart attack Father     Social History Social History   Tobacco Use   Smoking status: Former Smoker    Packs/day: 0.50    Years: 5.00    Pack years: 2.50    Types: Cigarettes    Last attempt to quit: 08/24/1979    Years since quitting: 39.3   Smokeless tobacco: Never Used  Substance Use Topics   Alcohol use: Not Currently    Alcohol/week: 0.0 standard drinks    Comment: 02/08/2018 "stopped in my 67's"   Drug use: Never     Allergies   Patient has no known allergies.   Review of Systems Review of Systems  Gastrointestinal: Positive for nausea and vomiting.  All other systems reviewed and are negative.    Physical Exam Updated Vital Signs BP (!) 122/59    Pulse 73    Temp 98.7 F (37.1 C) (Oral)    Resp 16    Ht 5\' 8"  (1.727 m)    Wt 92.5 kg    SpO2 100%    BMI 31.02 kg/m   Physical Exam Vitals signs and nursing note reviewed.  Constitutional:      General: He is not in acute distress.    Appearance: Normal appearance. He is well-developed.  HENT:     Head: Normocephalic and atraumatic.     Right Ear: Hearing normal.     Left Ear: Hearing  normal.     Nose: Nose normal.  Eyes:     Conjunctiva/sclera: Conjunctivae normal.     Pupils: Pupils are equal, round, and reactive to light.  Neck:     Musculoskeletal: Normal range of motion and neck supple.  Cardiovascular:     Rate and Rhythm: Regular rhythm.     Heart sounds: S1 normal and S2 normal. No murmur. No friction rub. No gallop.   Pulmonary:     Effort: Pulmonary effort is normal. No respiratory distress.     Breath  sounds: Normal breath sounds.  Chest:     Chest wall: No tenderness.  Abdominal:     General: Bowel sounds are normal.     Palpations: Abdomen is soft.     Tenderness: There is no abdominal tenderness. There is no guarding or rebound. Negative signs include Murphy's sign and McBurney's sign.     Hernia: No hernia is present.  Musculoskeletal: Normal range of motion.  Skin:    General: Skin is warm and dry.     Findings: No rash.  Neurological:     Mental Status: He is alert and oriented to person, place, and time.     GCS: GCS eye subscore is 4. GCS verbal subscore is 5. GCS motor subscore is 6.     Cranial Nerves: No cranial nerve deficit.     Sensory: No sensory deficit.     Coordination: Coordination normal.  Psychiatric:        Speech: Speech normal.        Behavior: Behavior normal.        Thought Content: Thought content normal.      ED Treatments / Results  Labs (all labs ordered are listed, but only abnormal results are displayed) Labs Reviewed  CBC WITH DIFFERENTIAL/PLATELET - Abnormal; Notable for the following components:      Result Value   RBC 3.84 (*)    Hemoglobin 10.9 (*)    HCT 35.5 (*)    All other components within normal limits  COMPREHENSIVE METABOLIC PANEL - Abnormal; Notable for the following components:   Chloride 95 (*)    Glucose, Bld 198 (*)    BUN 29 (*)    Creatinine, Ser 6.19 (*)    Albumin 3.3 (*)    GFR calc non Af Amer 8 (*)    GFR calc Af Amer 10 (*)    Anion gap 18 (*)    All other components  within normal limits  TROPONIN I  LIPASE, BLOOD    EKG None   ED ECG REPORT   Date: 12/22/2018  Rate: 75  Rhythm: normal sinus rhythm  QRS Axis: normal  Intervals: normal  ST/T Wave abnormalities: normal  Conduction Disutrbances:none  Narrative Interpretation:   Old EKG Reviewed: unchanged  I have personally reviewed the EKG tracing and agree with the computerized printout as noted.   Radiology Ct Abdomen Pelvis W Contrast  Result Date: 12/22/2018 CLINICAL DATA:  Initial evaluation for acute nausea, vomiting. EXAM: CT ABDOMEN AND PELVIS WITH CONTRAST TECHNIQUE: Multidetector CT imaging of the abdomen and pelvis was performed using the standard protocol following bolus administration of intravenous contrast. CONTRAST:  142mL OMNIPAQUE IOHEXOL 300 MG/ML  SOLN COMPARISON:  Prior CT from 02/08/2018. FINDINGS: Lower chest: Scattered fibrotic lung changes present within the visualized lung bases, right greater than left. No superimposed active cardiopulmonary disease. Hepatobiliary: Liver demonstrates a normal contrast enhanced appearance. Gallbladder within normal limits. No biliary dilatation. Pancreas: Pancreas within normal limits. Spleen: Spleen within normal limits. Adrenals/Urinary Tract: Adrenal glands are normal. Kidneys markedly atrophic bilaterally. Few scattered small renal cysts noted, a few of which demonstrate intermediate density, likely proteinaceous and/or hemorrhagic cysts, similar to previous. These are too small the characterize. No nephrolithiasis or hydronephrosis. No hydroureter. Bladder decompressed without acute finding. Stomach/Bowel: Mild circumferential wall thickening about the distal esophagus. Probable superimposed small hiatal hernia. Stomach decompressed without acute finding. No evidence for bowel obstruction. Normal appendix. Mild colonic diverticulosis without evidence for acute diverticulitis. No acute inflammatory changes seen about  the bowels.  Vascular/Lymphatic: Normal intravascular enhancement seen throughout the intra-abdominal aorta. Moderate atherosclerotic change. No aneurysm. Mesenteric vessels patent proximally. No adenopathy. Reproductive: Prostate enlarged measuring 5.1 cm in diameter. Other: No free air or fluid. Musculoskeletal: No acute osseous abnormality. No discrete lytic or blastic osseous lesions. Degenerative spondylolysis at L5-S1 noted. IMPRESSION: 1. Circumferential wall thickening about the distal esophagus, which may reflect sequelae of reflux disease, vomiting, possibly acute esophagitis. 2. No other acute intra-abdominal or pelvic process. 3. Mild colonic diverticulosis without evidence for acute diverticulitis. 4. Bibasilar fibrotic lung disease, right greater than left. Electronically Signed   By: Jeannine Boga M.D.   On: 12/22/2018 04:07   Dg Abd Acute 2+v W 1v Chest  Result Date: 12/22/2018 CLINICAL DATA:  Vomiting. EXAM: DG ABDOMEN ACUTE W/ 1V CHEST COMPARISON:  Chest radiograph 12/10/2018 FINDINGS: Improved cardiomegaly. Persistent interstitial opacities in both lungs, right greater than left, not significantly changed from prior exam. No pleural fluid. No evidence of free intra-abdominal air. No bowel dilatation to suggest obstruction. Generalized paucity of small bowel gas. Small to moderate colonic stool burden. There are vascular calcifications. No acute osseous abnormalities. IMPRESSION: 1. No evidence of obstruction. Generalized paucity of small bowel gas is nonspecific. Small to moderate colonic stool burden. 2. Unchanged interstitial opacities in both lungs, right greater than left, likely representing pulmonary edema. Electronically Signed   By: Keith Rake M.D.   On: 12/22/2018 01:26    Procedures Procedures (including critical care time)  Medications Ordered in ED Medications  ondansetron (ZOFRAN) injection 4 mg (4 mg Intravenous Given 12/22/18 0047)  metoCLOPramide (REGLAN) injection 5 mg  (5 mg Intravenous Given 12/22/18 0118)  sodium chloride 0.9 % bolus 250 mL (0 mLs Intravenous Stopped 12/22/18 0405)  iohexol (OMNIPAQUE) 300 MG/ML solution 100 mL (100 mLs Intravenous Contrast Given 12/22/18 0328)     Initial Impression / Assessment and Plan / ED Course  I have reviewed the triage vital signs and the nursing notes.  Pertinent labs & imaging results that were available during my care of the patient were reviewed by me and considered in my medical decision making (see chart for details).        Patient presents to the emergency department for evaluation of nausea and vomiting.  Symptoms began this evening, had previously been doing well.  Patient reports multiple episodes of emesis prior to arrival in the ER.  Patient does have a history of end-stage renal disease, on dialysis.  Had successful dialysis earlier today.  He does not appear to be volume overloaded on examination.  Lab work was unremarkable.  Patient administered Zofran followed by Reglan with improvement of his nausea and vomiting.  Blood pressure dropped when he was sleeping after Reglan was administered, but this improved with a small fluid bolus.  Patient underwent CT scan of abdomen and pelvis to further evaluate.  He does have some circumferential wall thickening of the distal esophagus, felt to be possibly acute esophagitis versus sequela of recent vomiting.  No other pathology noted.  Final Clinical Impressions(s) / ED Diagnoses   Final diagnoses:  Non-intractable vomiting with nausea, unspecified vomiting type    ED Discharge Orders    None       Karthikeya Funke, Gwenyth Allegra, MD 12/22/18 0422

## 2018-12-22 NOTE — ED Notes (Signed)
Patient transported to X-ray 

## 2018-12-22 NOTE — ED Triage Notes (Signed)
BIB EMS from home. Pt does dialysis on Tue-Thur-Sat, with a full tx today without problem. Around noon, pt began feeling nauseous with 5 episodes of emesis since that time. Unable to keep any food or fluids down. SOB with exertion. Per pt, told he's supposed to be on home O2 at 2L, but has been going without.

## 2018-12-22 NOTE — ED Notes (Signed)
Patient transported to CT 

## 2018-12-26 DIAGNOSIS — N2581 Secondary hyperparathyroidism of renal origin: Secondary | ICD-10-CM | POA: Diagnosis not present

## 2018-12-26 DIAGNOSIS — D689 Coagulation defect, unspecified: Secondary | ICD-10-CM | POA: Diagnosis not present

## 2018-12-26 DIAGNOSIS — D631 Anemia in chronic kidney disease: Secondary | ICD-10-CM | POA: Diagnosis not present

## 2018-12-26 DIAGNOSIS — E1129 Type 2 diabetes mellitus with other diabetic kidney complication: Secondary | ICD-10-CM | POA: Diagnosis not present

## 2018-12-26 DIAGNOSIS — N186 End stage renal disease: Secondary | ICD-10-CM | POA: Diagnosis not present

## 2018-12-28 DIAGNOSIS — E1129 Type 2 diabetes mellitus with other diabetic kidney complication: Secondary | ICD-10-CM | POA: Diagnosis not present

## 2018-12-28 DIAGNOSIS — N2581 Secondary hyperparathyroidism of renal origin: Secondary | ICD-10-CM | POA: Diagnosis not present

## 2018-12-28 DIAGNOSIS — N186 End stage renal disease: Secondary | ICD-10-CM | POA: Diagnosis not present

## 2018-12-28 DIAGNOSIS — D631 Anemia in chronic kidney disease: Secondary | ICD-10-CM | POA: Diagnosis not present

## 2018-12-28 DIAGNOSIS — D689 Coagulation defect, unspecified: Secondary | ICD-10-CM | POA: Diagnosis not present

## 2018-12-30 DIAGNOSIS — D689 Coagulation defect, unspecified: Secondary | ICD-10-CM | POA: Diagnosis not present

## 2018-12-30 DIAGNOSIS — D631 Anemia in chronic kidney disease: Secondary | ICD-10-CM | POA: Diagnosis not present

## 2018-12-30 DIAGNOSIS — E1129 Type 2 diabetes mellitus with other diabetic kidney complication: Secondary | ICD-10-CM | POA: Diagnosis not present

## 2018-12-30 DIAGNOSIS — N2581 Secondary hyperparathyroidism of renal origin: Secondary | ICD-10-CM | POA: Diagnosis not present

## 2018-12-30 DIAGNOSIS — N186 End stage renal disease: Secondary | ICD-10-CM | POA: Diagnosis not present

## 2019-01-02 DIAGNOSIS — E1129 Type 2 diabetes mellitus with other diabetic kidney complication: Secondary | ICD-10-CM | POA: Diagnosis not present

## 2019-01-02 DIAGNOSIS — D689 Coagulation defect, unspecified: Secondary | ICD-10-CM | POA: Diagnosis not present

## 2019-01-02 DIAGNOSIS — N2581 Secondary hyperparathyroidism of renal origin: Secondary | ICD-10-CM | POA: Diagnosis not present

## 2019-01-02 DIAGNOSIS — D631 Anemia in chronic kidney disease: Secondary | ICD-10-CM | POA: Diagnosis not present

## 2019-01-02 DIAGNOSIS — N186 End stage renal disease: Secondary | ICD-10-CM | POA: Diagnosis not present

## 2019-01-04 DIAGNOSIS — N186 End stage renal disease: Secondary | ICD-10-CM | POA: Diagnosis not present

## 2019-01-04 DIAGNOSIS — N2581 Secondary hyperparathyroidism of renal origin: Secondary | ICD-10-CM | POA: Diagnosis not present

## 2019-01-04 DIAGNOSIS — E1129 Type 2 diabetes mellitus with other diabetic kidney complication: Secondary | ICD-10-CM | POA: Diagnosis not present

## 2019-01-04 DIAGNOSIS — D631 Anemia in chronic kidney disease: Secondary | ICD-10-CM | POA: Diagnosis not present

## 2019-01-04 DIAGNOSIS — D689 Coagulation defect, unspecified: Secondary | ICD-10-CM | POA: Diagnosis not present

## 2019-01-06 DIAGNOSIS — N186 End stage renal disease: Secondary | ICD-10-CM | POA: Diagnosis not present

## 2019-01-06 DIAGNOSIS — E1129 Type 2 diabetes mellitus with other diabetic kidney complication: Secondary | ICD-10-CM | POA: Diagnosis not present

## 2019-01-06 DIAGNOSIS — D631 Anemia in chronic kidney disease: Secondary | ICD-10-CM | POA: Diagnosis not present

## 2019-01-06 DIAGNOSIS — D689 Coagulation defect, unspecified: Secondary | ICD-10-CM | POA: Diagnosis not present

## 2019-01-06 DIAGNOSIS — N2581 Secondary hyperparathyroidism of renal origin: Secondary | ICD-10-CM | POA: Diagnosis not present

## 2019-01-09 DIAGNOSIS — N186 End stage renal disease: Secondary | ICD-10-CM | POA: Diagnosis not present

## 2019-01-09 DIAGNOSIS — E1129 Type 2 diabetes mellitus with other diabetic kidney complication: Secondary | ICD-10-CM | POA: Diagnosis not present

## 2019-01-09 DIAGNOSIS — D631 Anemia in chronic kidney disease: Secondary | ICD-10-CM | POA: Diagnosis not present

## 2019-01-09 DIAGNOSIS — N2581 Secondary hyperparathyroidism of renal origin: Secondary | ICD-10-CM | POA: Diagnosis not present

## 2019-01-09 DIAGNOSIS — D689 Coagulation defect, unspecified: Secondary | ICD-10-CM | POA: Diagnosis not present

## 2019-01-11 DIAGNOSIS — D631 Anemia in chronic kidney disease: Secondary | ICD-10-CM | POA: Diagnosis not present

## 2019-01-11 DIAGNOSIS — N2581 Secondary hyperparathyroidism of renal origin: Secondary | ICD-10-CM | POA: Diagnosis not present

## 2019-01-11 DIAGNOSIS — E1122 Type 2 diabetes mellitus with diabetic chronic kidney disease: Secondary | ICD-10-CM | POA: Diagnosis not present

## 2019-01-11 DIAGNOSIS — E1129 Type 2 diabetes mellitus with other diabetic kidney complication: Secondary | ICD-10-CM | POA: Diagnosis not present

## 2019-01-11 DIAGNOSIS — Z992 Dependence on renal dialysis: Secondary | ICD-10-CM | POA: Diagnosis not present

## 2019-01-11 DIAGNOSIS — D689 Coagulation defect, unspecified: Secondary | ICD-10-CM | POA: Diagnosis not present

## 2019-01-11 DIAGNOSIS — N186 End stage renal disease: Secondary | ICD-10-CM | POA: Diagnosis not present

## 2019-01-13 DIAGNOSIS — N2581 Secondary hyperparathyroidism of renal origin: Secondary | ICD-10-CM | POA: Diagnosis not present

## 2019-01-13 DIAGNOSIS — D689 Coagulation defect, unspecified: Secondary | ICD-10-CM | POA: Diagnosis not present

## 2019-01-13 DIAGNOSIS — N186 End stage renal disease: Secondary | ICD-10-CM | POA: Diagnosis not present

## 2019-01-13 DIAGNOSIS — E1129 Type 2 diabetes mellitus with other diabetic kidney complication: Secondary | ICD-10-CM | POA: Diagnosis not present

## 2019-01-16 DIAGNOSIS — N186 End stage renal disease: Secondary | ICD-10-CM | POA: Diagnosis not present

## 2019-01-16 DIAGNOSIS — E1129 Type 2 diabetes mellitus with other diabetic kidney complication: Secondary | ICD-10-CM | POA: Diagnosis not present

## 2019-01-16 DIAGNOSIS — D689 Coagulation defect, unspecified: Secondary | ICD-10-CM | POA: Diagnosis not present

## 2019-01-16 DIAGNOSIS — N2581 Secondary hyperparathyroidism of renal origin: Secondary | ICD-10-CM | POA: Diagnosis not present

## 2019-01-18 DIAGNOSIS — D689 Coagulation defect, unspecified: Secondary | ICD-10-CM | POA: Diagnosis not present

## 2019-01-18 DIAGNOSIS — N2581 Secondary hyperparathyroidism of renal origin: Secondary | ICD-10-CM | POA: Diagnosis not present

## 2019-01-18 DIAGNOSIS — E1129 Type 2 diabetes mellitus with other diabetic kidney complication: Secondary | ICD-10-CM | POA: Diagnosis not present

## 2019-01-18 DIAGNOSIS — N186 End stage renal disease: Secondary | ICD-10-CM | POA: Diagnosis not present

## 2019-01-20 DIAGNOSIS — N2581 Secondary hyperparathyroidism of renal origin: Secondary | ICD-10-CM | POA: Diagnosis not present

## 2019-01-20 DIAGNOSIS — D689 Coagulation defect, unspecified: Secondary | ICD-10-CM | POA: Diagnosis not present

## 2019-01-20 DIAGNOSIS — N186 End stage renal disease: Secondary | ICD-10-CM | POA: Diagnosis not present

## 2019-01-20 DIAGNOSIS — E1129 Type 2 diabetes mellitus with other diabetic kidney complication: Secondary | ICD-10-CM | POA: Diagnosis not present

## 2019-01-23 DIAGNOSIS — N186 End stage renal disease: Secondary | ICD-10-CM | POA: Diagnosis not present

## 2019-01-23 DIAGNOSIS — N2581 Secondary hyperparathyroidism of renal origin: Secondary | ICD-10-CM | POA: Diagnosis not present

## 2019-01-23 DIAGNOSIS — E1129 Type 2 diabetes mellitus with other diabetic kidney complication: Secondary | ICD-10-CM | POA: Diagnosis not present

## 2019-01-23 DIAGNOSIS — D689 Coagulation defect, unspecified: Secondary | ICD-10-CM | POA: Diagnosis not present

## 2019-01-25 DIAGNOSIS — E1129 Type 2 diabetes mellitus with other diabetic kidney complication: Secondary | ICD-10-CM | POA: Diagnosis not present

## 2019-01-25 DIAGNOSIS — D689 Coagulation defect, unspecified: Secondary | ICD-10-CM | POA: Diagnosis not present

## 2019-01-25 DIAGNOSIS — N2581 Secondary hyperparathyroidism of renal origin: Secondary | ICD-10-CM | POA: Diagnosis not present

## 2019-01-25 DIAGNOSIS — N186 End stage renal disease: Secondary | ICD-10-CM | POA: Diagnosis not present

## 2019-01-27 DIAGNOSIS — N2581 Secondary hyperparathyroidism of renal origin: Secondary | ICD-10-CM | POA: Diagnosis not present

## 2019-01-27 DIAGNOSIS — E1129 Type 2 diabetes mellitus with other diabetic kidney complication: Secondary | ICD-10-CM | POA: Diagnosis not present

## 2019-01-27 DIAGNOSIS — D689 Coagulation defect, unspecified: Secondary | ICD-10-CM | POA: Diagnosis not present

## 2019-01-27 DIAGNOSIS — N186 End stage renal disease: Secondary | ICD-10-CM | POA: Diagnosis not present

## 2019-02-01 DIAGNOSIS — D689 Coagulation defect, unspecified: Secondary | ICD-10-CM | POA: Diagnosis not present

## 2019-02-01 DIAGNOSIS — N186 End stage renal disease: Secondary | ICD-10-CM | POA: Diagnosis not present

## 2019-02-01 DIAGNOSIS — N2581 Secondary hyperparathyroidism of renal origin: Secondary | ICD-10-CM | POA: Diagnosis not present

## 2019-02-01 DIAGNOSIS — E1129 Type 2 diabetes mellitus with other diabetic kidney complication: Secondary | ICD-10-CM | POA: Diagnosis not present

## 2019-02-03 DIAGNOSIS — N2581 Secondary hyperparathyroidism of renal origin: Secondary | ICD-10-CM | POA: Diagnosis not present

## 2019-02-03 DIAGNOSIS — N186 End stage renal disease: Secondary | ICD-10-CM | POA: Diagnosis not present

## 2019-02-03 DIAGNOSIS — D689 Coagulation defect, unspecified: Secondary | ICD-10-CM | POA: Diagnosis not present

## 2019-02-03 DIAGNOSIS — E1129 Type 2 diabetes mellitus with other diabetic kidney complication: Secondary | ICD-10-CM | POA: Diagnosis not present

## 2019-02-06 DIAGNOSIS — E1129 Type 2 diabetes mellitus with other diabetic kidney complication: Secondary | ICD-10-CM | POA: Diagnosis not present

## 2019-02-06 DIAGNOSIS — N186 End stage renal disease: Secondary | ICD-10-CM | POA: Diagnosis not present

## 2019-02-06 DIAGNOSIS — D689 Coagulation defect, unspecified: Secondary | ICD-10-CM | POA: Diagnosis not present

## 2019-02-06 DIAGNOSIS — N2581 Secondary hyperparathyroidism of renal origin: Secondary | ICD-10-CM | POA: Diagnosis not present

## 2019-02-10 DIAGNOSIS — N186 End stage renal disease: Secondary | ICD-10-CM | POA: Diagnosis not present

## 2019-02-10 DIAGNOSIS — N2581 Secondary hyperparathyroidism of renal origin: Secondary | ICD-10-CM | POA: Diagnosis not present

## 2019-02-10 DIAGNOSIS — E1129 Type 2 diabetes mellitus with other diabetic kidney complication: Secondary | ICD-10-CM | POA: Diagnosis not present

## 2019-02-10 DIAGNOSIS — D689 Coagulation defect, unspecified: Secondary | ICD-10-CM | POA: Diagnosis not present

## 2019-02-11 DIAGNOSIS — E1122 Type 2 diabetes mellitus with diabetic chronic kidney disease: Secondary | ICD-10-CM | POA: Diagnosis not present

## 2019-02-11 DIAGNOSIS — Z992 Dependence on renal dialysis: Secondary | ICD-10-CM | POA: Diagnosis not present

## 2019-02-11 DIAGNOSIS — N186 End stage renal disease: Secondary | ICD-10-CM | POA: Diagnosis not present

## 2019-02-13 DIAGNOSIS — D689 Coagulation defect, unspecified: Secondary | ICD-10-CM | POA: Diagnosis not present

## 2019-02-13 DIAGNOSIS — E1129 Type 2 diabetes mellitus with other diabetic kidney complication: Secondary | ICD-10-CM | POA: Diagnosis not present

## 2019-02-13 DIAGNOSIS — N186 End stage renal disease: Secondary | ICD-10-CM | POA: Diagnosis not present

## 2019-02-13 DIAGNOSIS — N2581 Secondary hyperparathyroidism of renal origin: Secondary | ICD-10-CM | POA: Diagnosis not present

## 2019-02-15 DIAGNOSIS — N2581 Secondary hyperparathyroidism of renal origin: Secondary | ICD-10-CM | POA: Diagnosis not present

## 2019-02-15 DIAGNOSIS — D689 Coagulation defect, unspecified: Secondary | ICD-10-CM | POA: Diagnosis not present

## 2019-02-15 DIAGNOSIS — E1129 Type 2 diabetes mellitus with other diabetic kidney complication: Secondary | ICD-10-CM | POA: Diagnosis not present

## 2019-02-15 DIAGNOSIS — N186 End stage renal disease: Secondary | ICD-10-CM | POA: Diagnosis not present

## 2019-02-17 DIAGNOSIS — N2581 Secondary hyperparathyroidism of renal origin: Secondary | ICD-10-CM | POA: Diagnosis not present

## 2019-02-17 DIAGNOSIS — D689 Coagulation defect, unspecified: Secondary | ICD-10-CM | POA: Diagnosis not present

## 2019-02-17 DIAGNOSIS — E1129 Type 2 diabetes mellitus with other diabetic kidney complication: Secondary | ICD-10-CM | POA: Diagnosis not present

## 2019-02-17 DIAGNOSIS — N186 End stage renal disease: Secondary | ICD-10-CM | POA: Diagnosis not present

## 2019-02-22 DIAGNOSIS — N186 End stage renal disease: Secondary | ICD-10-CM | POA: Diagnosis not present

## 2019-02-22 DIAGNOSIS — D689 Coagulation defect, unspecified: Secondary | ICD-10-CM | POA: Diagnosis not present

## 2019-02-22 DIAGNOSIS — E1129 Type 2 diabetes mellitus with other diabetic kidney complication: Secondary | ICD-10-CM | POA: Diagnosis not present

## 2019-02-22 DIAGNOSIS — N2581 Secondary hyperparathyroidism of renal origin: Secondary | ICD-10-CM | POA: Diagnosis not present

## 2019-02-24 DIAGNOSIS — E1129 Type 2 diabetes mellitus with other diabetic kidney complication: Secondary | ICD-10-CM | POA: Diagnosis not present

## 2019-02-24 DIAGNOSIS — N2581 Secondary hyperparathyroidism of renal origin: Secondary | ICD-10-CM | POA: Diagnosis not present

## 2019-02-24 DIAGNOSIS — D689 Coagulation defect, unspecified: Secondary | ICD-10-CM | POA: Diagnosis not present

## 2019-02-24 DIAGNOSIS — N186 End stage renal disease: Secondary | ICD-10-CM | POA: Diagnosis not present

## 2019-03-01 DIAGNOSIS — E1129 Type 2 diabetes mellitus with other diabetic kidney complication: Secondary | ICD-10-CM | POA: Diagnosis not present

## 2019-03-01 DIAGNOSIS — N2581 Secondary hyperparathyroidism of renal origin: Secondary | ICD-10-CM | POA: Diagnosis not present

## 2019-03-01 DIAGNOSIS — D689 Coagulation defect, unspecified: Secondary | ICD-10-CM | POA: Diagnosis not present

## 2019-03-01 DIAGNOSIS — N186 End stage renal disease: Secondary | ICD-10-CM | POA: Diagnosis not present

## 2019-03-03 DIAGNOSIS — D689 Coagulation defect, unspecified: Secondary | ICD-10-CM | POA: Diagnosis not present

## 2019-03-03 DIAGNOSIS — N186 End stage renal disease: Secondary | ICD-10-CM | POA: Diagnosis not present

## 2019-03-03 DIAGNOSIS — E1129 Type 2 diabetes mellitus with other diabetic kidney complication: Secondary | ICD-10-CM | POA: Diagnosis not present

## 2019-03-03 DIAGNOSIS — N2581 Secondary hyperparathyroidism of renal origin: Secondary | ICD-10-CM | POA: Diagnosis not present

## 2019-03-05 DIAGNOSIS — R0902 Hypoxemia: Secondary | ICD-10-CM | POA: Diagnosis not present

## 2019-03-05 DIAGNOSIS — R531 Weakness: Secondary | ICD-10-CM | POA: Diagnosis not present

## 2019-03-06 DIAGNOSIS — N2581 Secondary hyperparathyroidism of renal origin: Secondary | ICD-10-CM | POA: Diagnosis not present

## 2019-03-06 DIAGNOSIS — D689 Coagulation defect, unspecified: Secondary | ICD-10-CM | POA: Diagnosis not present

## 2019-03-06 DIAGNOSIS — N186 End stage renal disease: Secondary | ICD-10-CM | POA: Diagnosis not present

## 2019-03-06 DIAGNOSIS — E1129 Type 2 diabetes mellitus with other diabetic kidney complication: Secondary | ICD-10-CM | POA: Diagnosis not present

## 2019-03-08 DIAGNOSIS — E1129 Type 2 diabetes mellitus with other diabetic kidney complication: Secondary | ICD-10-CM | POA: Diagnosis not present

## 2019-03-08 DIAGNOSIS — N2581 Secondary hyperparathyroidism of renal origin: Secondary | ICD-10-CM | POA: Diagnosis not present

## 2019-03-08 DIAGNOSIS — D689 Coagulation defect, unspecified: Secondary | ICD-10-CM | POA: Diagnosis not present

## 2019-03-08 DIAGNOSIS — N186 End stage renal disease: Secondary | ICD-10-CM | POA: Diagnosis not present

## 2019-03-10 DIAGNOSIS — N2581 Secondary hyperparathyroidism of renal origin: Secondary | ICD-10-CM | POA: Diagnosis not present

## 2019-03-10 DIAGNOSIS — E1129 Type 2 diabetes mellitus with other diabetic kidney complication: Secondary | ICD-10-CM | POA: Diagnosis not present

## 2019-03-10 DIAGNOSIS — D689 Coagulation defect, unspecified: Secondary | ICD-10-CM | POA: Diagnosis not present

## 2019-03-10 DIAGNOSIS — N186 End stage renal disease: Secondary | ICD-10-CM | POA: Diagnosis not present

## 2019-03-13 DIAGNOSIS — E1122 Type 2 diabetes mellitus with diabetic chronic kidney disease: Secondary | ICD-10-CM | POA: Diagnosis not present

## 2019-03-13 DIAGNOSIS — E1129 Type 2 diabetes mellitus with other diabetic kidney complication: Secondary | ICD-10-CM | POA: Diagnosis not present

## 2019-03-13 DIAGNOSIS — N2581 Secondary hyperparathyroidism of renal origin: Secondary | ICD-10-CM | POA: Diagnosis not present

## 2019-03-13 DIAGNOSIS — Z992 Dependence on renal dialysis: Secondary | ICD-10-CM | POA: Diagnosis not present

## 2019-03-13 DIAGNOSIS — D689 Coagulation defect, unspecified: Secondary | ICD-10-CM | POA: Diagnosis not present

## 2019-03-13 DIAGNOSIS — N186 End stage renal disease: Secondary | ICD-10-CM | POA: Diagnosis not present

## 2019-03-15 DIAGNOSIS — D631 Anemia in chronic kidney disease: Secondary | ICD-10-CM | POA: Diagnosis not present

## 2019-03-15 DIAGNOSIS — D509 Iron deficiency anemia, unspecified: Secondary | ICD-10-CM | POA: Diagnosis not present

## 2019-03-15 DIAGNOSIS — N2581 Secondary hyperparathyroidism of renal origin: Secondary | ICD-10-CM | POA: Diagnosis not present

## 2019-03-15 DIAGNOSIS — N186 End stage renal disease: Secondary | ICD-10-CM | POA: Diagnosis not present

## 2019-03-15 DIAGNOSIS — D689 Coagulation defect, unspecified: Secondary | ICD-10-CM | POA: Diagnosis not present

## 2019-03-15 DIAGNOSIS — E1129 Type 2 diabetes mellitus with other diabetic kidney complication: Secondary | ICD-10-CM | POA: Diagnosis not present

## 2019-03-17 DIAGNOSIS — N186 End stage renal disease: Secondary | ICD-10-CM | POA: Diagnosis not present

## 2019-03-17 DIAGNOSIS — E1129 Type 2 diabetes mellitus with other diabetic kidney complication: Secondary | ICD-10-CM | POA: Diagnosis not present

## 2019-03-17 DIAGNOSIS — N2581 Secondary hyperparathyroidism of renal origin: Secondary | ICD-10-CM | POA: Diagnosis not present

## 2019-03-17 DIAGNOSIS — D509 Iron deficiency anemia, unspecified: Secondary | ICD-10-CM | POA: Diagnosis not present

## 2019-03-17 DIAGNOSIS — D631 Anemia in chronic kidney disease: Secondary | ICD-10-CM | POA: Diagnosis not present

## 2019-03-17 DIAGNOSIS — D689 Coagulation defect, unspecified: Secondary | ICD-10-CM | POA: Diagnosis not present

## 2019-03-22 DIAGNOSIS — D509 Iron deficiency anemia, unspecified: Secondary | ICD-10-CM | POA: Diagnosis not present

## 2019-03-22 DIAGNOSIS — N186 End stage renal disease: Secondary | ICD-10-CM | POA: Diagnosis not present

## 2019-03-22 DIAGNOSIS — D631 Anemia in chronic kidney disease: Secondary | ICD-10-CM | POA: Diagnosis not present

## 2019-03-22 DIAGNOSIS — E1129 Type 2 diabetes mellitus with other diabetic kidney complication: Secondary | ICD-10-CM | POA: Diagnosis not present

## 2019-03-22 DIAGNOSIS — D689 Coagulation defect, unspecified: Secondary | ICD-10-CM | POA: Diagnosis not present

## 2019-03-22 DIAGNOSIS — N2581 Secondary hyperparathyroidism of renal origin: Secondary | ICD-10-CM | POA: Diagnosis not present

## 2019-03-24 DIAGNOSIS — E1129 Type 2 diabetes mellitus with other diabetic kidney complication: Secondary | ICD-10-CM | POA: Diagnosis not present

## 2019-03-24 DIAGNOSIS — N186 End stage renal disease: Secondary | ICD-10-CM | POA: Diagnosis not present

## 2019-03-24 DIAGNOSIS — D631 Anemia in chronic kidney disease: Secondary | ICD-10-CM | POA: Diagnosis not present

## 2019-03-24 DIAGNOSIS — N2581 Secondary hyperparathyroidism of renal origin: Secondary | ICD-10-CM | POA: Diagnosis not present

## 2019-03-24 DIAGNOSIS — D689 Coagulation defect, unspecified: Secondary | ICD-10-CM | POA: Diagnosis not present

## 2019-03-24 DIAGNOSIS — D509 Iron deficiency anemia, unspecified: Secondary | ICD-10-CM | POA: Diagnosis not present

## 2019-03-27 DIAGNOSIS — D689 Coagulation defect, unspecified: Secondary | ICD-10-CM | POA: Diagnosis not present

## 2019-03-27 DIAGNOSIS — N186 End stage renal disease: Secondary | ICD-10-CM | POA: Diagnosis not present

## 2019-03-27 DIAGNOSIS — D509 Iron deficiency anemia, unspecified: Secondary | ICD-10-CM | POA: Diagnosis not present

## 2019-03-27 DIAGNOSIS — N2581 Secondary hyperparathyroidism of renal origin: Secondary | ICD-10-CM | POA: Diagnosis not present

## 2019-03-27 DIAGNOSIS — E1129 Type 2 diabetes mellitus with other diabetic kidney complication: Secondary | ICD-10-CM | POA: Diagnosis not present

## 2019-03-27 DIAGNOSIS — D631 Anemia in chronic kidney disease: Secondary | ICD-10-CM | POA: Diagnosis not present

## 2019-03-29 DIAGNOSIS — N186 End stage renal disease: Secondary | ICD-10-CM | POA: Diagnosis not present

## 2019-03-29 DIAGNOSIS — E1129 Type 2 diabetes mellitus with other diabetic kidney complication: Secondary | ICD-10-CM | POA: Diagnosis not present

## 2019-03-29 DIAGNOSIS — N2581 Secondary hyperparathyroidism of renal origin: Secondary | ICD-10-CM | POA: Diagnosis not present

## 2019-03-29 DIAGNOSIS — D509 Iron deficiency anemia, unspecified: Secondary | ICD-10-CM | POA: Diagnosis not present

## 2019-03-29 DIAGNOSIS — D631 Anemia in chronic kidney disease: Secondary | ICD-10-CM | POA: Diagnosis not present

## 2019-03-29 DIAGNOSIS — D689 Coagulation defect, unspecified: Secondary | ICD-10-CM | POA: Diagnosis not present

## 2019-03-31 DIAGNOSIS — E1129 Type 2 diabetes mellitus with other diabetic kidney complication: Secondary | ICD-10-CM | POA: Diagnosis not present

## 2019-03-31 DIAGNOSIS — D689 Coagulation defect, unspecified: Secondary | ICD-10-CM | POA: Diagnosis not present

## 2019-03-31 DIAGNOSIS — N186 End stage renal disease: Secondary | ICD-10-CM | POA: Diagnosis not present

## 2019-03-31 DIAGNOSIS — D509 Iron deficiency anemia, unspecified: Secondary | ICD-10-CM | POA: Diagnosis not present

## 2019-03-31 DIAGNOSIS — N2581 Secondary hyperparathyroidism of renal origin: Secondary | ICD-10-CM | POA: Diagnosis not present

## 2019-03-31 DIAGNOSIS — D631 Anemia in chronic kidney disease: Secondary | ICD-10-CM | POA: Diagnosis not present

## 2019-04-03 DIAGNOSIS — E1129 Type 2 diabetes mellitus with other diabetic kidney complication: Secondary | ICD-10-CM | POA: Diagnosis not present

## 2019-04-03 DIAGNOSIS — N186 End stage renal disease: Secondary | ICD-10-CM | POA: Diagnosis not present

## 2019-04-03 DIAGNOSIS — D689 Coagulation defect, unspecified: Secondary | ICD-10-CM | POA: Diagnosis not present

## 2019-04-03 DIAGNOSIS — D509 Iron deficiency anemia, unspecified: Secondary | ICD-10-CM | POA: Diagnosis not present

## 2019-04-03 DIAGNOSIS — D631 Anemia in chronic kidney disease: Secondary | ICD-10-CM | POA: Diagnosis not present

## 2019-04-03 DIAGNOSIS — N2581 Secondary hyperparathyroidism of renal origin: Secondary | ICD-10-CM | POA: Diagnosis not present

## 2019-04-05 DIAGNOSIS — N2581 Secondary hyperparathyroidism of renal origin: Secondary | ICD-10-CM | POA: Diagnosis not present

## 2019-04-05 DIAGNOSIS — N186 End stage renal disease: Secondary | ICD-10-CM | POA: Diagnosis not present

## 2019-04-05 DIAGNOSIS — D689 Coagulation defect, unspecified: Secondary | ICD-10-CM | POA: Diagnosis not present

## 2019-04-05 DIAGNOSIS — D509 Iron deficiency anemia, unspecified: Secondary | ICD-10-CM | POA: Diagnosis not present

## 2019-04-05 DIAGNOSIS — D631 Anemia in chronic kidney disease: Secondary | ICD-10-CM | POA: Diagnosis not present

## 2019-04-05 DIAGNOSIS — E1129 Type 2 diabetes mellitus with other diabetic kidney complication: Secondary | ICD-10-CM | POA: Diagnosis not present

## 2019-04-10 DIAGNOSIS — D689 Coagulation defect, unspecified: Secondary | ICD-10-CM | POA: Diagnosis not present

## 2019-04-10 DIAGNOSIS — D631 Anemia in chronic kidney disease: Secondary | ICD-10-CM | POA: Diagnosis not present

## 2019-04-10 DIAGNOSIS — N2581 Secondary hyperparathyroidism of renal origin: Secondary | ICD-10-CM | POA: Diagnosis not present

## 2019-04-10 DIAGNOSIS — D509 Iron deficiency anemia, unspecified: Secondary | ICD-10-CM | POA: Diagnosis not present

## 2019-04-10 DIAGNOSIS — E1129 Type 2 diabetes mellitus with other diabetic kidney complication: Secondary | ICD-10-CM | POA: Diagnosis not present

## 2019-04-10 DIAGNOSIS — N186 End stage renal disease: Secondary | ICD-10-CM | POA: Diagnosis not present

## 2019-04-12 DIAGNOSIS — D689 Coagulation defect, unspecified: Secondary | ICD-10-CM | POA: Diagnosis not present

## 2019-04-12 DIAGNOSIS — N186 End stage renal disease: Secondary | ICD-10-CM | POA: Diagnosis not present

## 2019-04-12 DIAGNOSIS — E1129 Type 2 diabetes mellitus with other diabetic kidney complication: Secondary | ICD-10-CM | POA: Diagnosis not present

## 2019-04-12 DIAGNOSIS — D509 Iron deficiency anemia, unspecified: Secondary | ICD-10-CM | POA: Diagnosis not present

## 2019-04-12 DIAGNOSIS — D631 Anemia in chronic kidney disease: Secondary | ICD-10-CM | POA: Diagnosis not present

## 2019-04-12 DIAGNOSIS — N2581 Secondary hyperparathyroidism of renal origin: Secondary | ICD-10-CM | POA: Diagnosis not present

## 2019-04-13 DIAGNOSIS — N186 End stage renal disease: Secondary | ICD-10-CM | POA: Diagnosis not present

## 2019-04-13 DIAGNOSIS — Z992 Dependence on renal dialysis: Secondary | ICD-10-CM | POA: Diagnosis not present

## 2019-04-13 DIAGNOSIS — E1122 Type 2 diabetes mellitus with diabetic chronic kidney disease: Secondary | ICD-10-CM | POA: Diagnosis not present

## 2019-04-14 DIAGNOSIS — N186 End stage renal disease: Secondary | ICD-10-CM | POA: Diagnosis not present

## 2019-04-14 DIAGNOSIS — Z992 Dependence on renal dialysis: Secondary | ICD-10-CM | POA: Diagnosis not present

## 2019-04-14 DIAGNOSIS — N2581 Secondary hyperparathyroidism of renal origin: Secondary | ICD-10-CM | POA: Diagnosis not present

## 2019-04-14 DIAGNOSIS — D689 Coagulation defect, unspecified: Secondary | ICD-10-CM | POA: Diagnosis not present

## 2019-04-14 DIAGNOSIS — D509 Iron deficiency anemia, unspecified: Secondary | ICD-10-CM | POA: Diagnosis not present

## 2019-04-17 DIAGNOSIS — D689 Coagulation defect, unspecified: Secondary | ICD-10-CM | POA: Diagnosis not present

## 2019-04-17 DIAGNOSIS — D509 Iron deficiency anemia, unspecified: Secondary | ICD-10-CM | POA: Diagnosis not present

## 2019-04-17 DIAGNOSIS — Z992 Dependence on renal dialysis: Secondary | ICD-10-CM | POA: Diagnosis not present

## 2019-04-17 DIAGNOSIS — N186 End stage renal disease: Secondary | ICD-10-CM | POA: Diagnosis not present

## 2019-04-17 DIAGNOSIS — N2581 Secondary hyperparathyroidism of renal origin: Secondary | ICD-10-CM | POA: Diagnosis not present

## 2019-04-21 DIAGNOSIS — Z992 Dependence on renal dialysis: Secondary | ICD-10-CM | POA: Diagnosis not present

## 2019-04-21 DIAGNOSIS — D509 Iron deficiency anemia, unspecified: Secondary | ICD-10-CM | POA: Diagnosis not present

## 2019-04-21 DIAGNOSIS — D689 Coagulation defect, unspecified: Secondary | ICD-10-CM | POA: Diagnosis not present

## 2019-04-21 DIAGNOSIS — N186 End stage renal disease: Secondary | ICD-10-CM | POA: Diagnosis not present

## 2019-04-21 DIAGNOSIS — N2581 Secondary hyperparathyroidism of renal origin: Secondary | ICD-10-CM | POA: Diagnosis not present

## 2019-04-24 ENCOUNTER — Telehealth: Payer: Self-pay

## 2019-04-24 ENCOUNTER — Emergency Department (HOSPITAL_COMMUNITY): Payer: Medicare Other | Admitting: Anesthesiology

## 2019-04-24 ENCOUNTER — Ambulatory Visit (HOSPITAL_COMMUNITY)
Admission: EM | Admit: 2019-04-24 | Discharge: 2019-04-24 | Disposition: A | Discharge status: Home / Self Care | Payer: Medicare Other | Attending: Emergency Medicine | Admitting: Emergency Medicine

## 2019-04-24 ENCOUNTER — Other Ambulatory Visit: Payer: Self-pay

## 2019-04-24 ENCOUNTER — Encounter (HOSPITAL_COMMUNITY): Payer: Self-pay | Admitting: Emergency Medicine

## 2019-04-24 ENCOUNTER — Encounter (HOSPITAL_COMMUNITY)
Admission: EM | Disposition: A | Discharge status: Home / Self Care | Payer: Self-pay | Source: Home / Self Care | Attending: Emergency Medicine

## 2019-04-24 DIAGNOSIS — M1990 Unspecified osteoarthritis, unspecified site: Secondary | ICD-10-CM | POA: Insufficient documentation

## 2019-04-24 DIAGNOSIS — K219 Gastro-esophageal reflux disease without esophagitis: Secondary | ICD-10-CM | POA: Diagnosis not present

## 2019-04-24 DIAGNOSIS — E11319 Type 2 diabetes mellitus with unspecified diabetic retinopathy without macular edema: Secondary | ICD-10-CM | POA: Diagnosis not present

## 2019-04-24 DIAGNOSIS — Z8249 Family history of ischemic heart disease and other diseases of the circulatory system: Secondary | ICD-10-CM | POA: Insufficient documentation

## 2019-04-24 DIAGNOSIS — Z7982 Long term (current) use of aspirin: Secondary | ICD-10-CM | POA: Insufficient documentation

## 2019-04-24 DIAGNOSIS — E1122 Type 2 diabetes mellitus with diabetic chronic kidney disease: Secondary | ICD-10-CM | POA: Diagnosis not present

## 2019-04-24 DIAGNOSIS — D689 Coagulation defect, unspecified: Secondary | ICD-10-CM | POA: Diagnosis not present

## 2019-04-24 DIAGNOSIS — Z79899 Other long term (current) drug therapy: Secondary | ICD-10-CM | POA: Diagnosis not present

## 2019-04-24 DIAGNOSIS — J9611 Chronic respiratory failure with hypoxia: Secondary | ICD-10-CM | POA: Diagnosis not present

## 2019-04-24 DIAGNOSIS — I120 Hypertensive chronic kidney disease with stage 5 chronic kidney disease or end stage renal disease: Secondary | ICD-10-CM | POA: Insufficient documentation

## 2019-04-24 DIAGNOSIS — N186 End stage renal disease: Secondary | ICD-10-CM | POA: Diagnosis not present

## 2019-04-24 DIAGNOSIS — E785 Hyperlipidemia, unspecified: Secondary | ICD-10-CM | POA: Diagnosis not present

## 2019-04-24 DIAGNOSIS — Z794 Long term (current) use of insulin: Secondary | ICD-10-CM | POA: Insufficient documentation

## 2019-04-24 DIAGNOSIS — T82898A Other specified complication of vascular prosthetic devices, implants and grafts, initial encounter: Secondary | ICD-10-CM | POA: Insufficient documentation

## 2019-04-24 DIAGNOSIS — Z89411 Acquired absence of right great toe: Secondary | ICD-10-CM | POA: Insufficient documentation

## 2019-04-24 DIAGNOSIS — T829XXA Unspecified complication of cardiac and vascular prosthetic device, implant and graft, initial encounter: Secondary | ICD-10-CM | POA: Diagnosis present

## 2019-04-24 DIAGNOSIS — I728 Aneurysm of other specified arteries: Secondary | ICD-10-CM | POA: Insufficient documentation

## 2019-04-24 DIAGNOSIS — I770 Arteriovenous fistula, acquired: Secondary | ICD-10-CM | POA: Diagnosis present

## 2019-04-24 DIAGNOSIS — N2581 Secondary hyperparathyroidism of renal origin: Secondary | ICD-10-CM | POA: Diagnosis not present

## 2019-04-24 DIAGNOSIS — M79606 Pain in leg, unspecified: Secondary | ICD-10-CM | POA: Insufficient documentation

## 2019-04-24 DIAGNOSIS — L98499 Non-pressure chronic ulcer of skin of other sites with unspecified severity: Secondary | ICD-10-CM | POA: Insufficient documentation

## 2019-04-24 DIAGNOSIS — G9341 Metabolic encephalopathy: Secondary | ICD-10-CM | POA: Diagnosis not present

## 2019-04-24 DIAGNOSIS — D509 Iron deficiency anemia, unspecified: Secondary | ICD-10-CM | POA: Diagnosis not present

## 2019-04-24 DIAGNOSIS — D696 Thrombocytopenia, unspecified: Secondary | ICD-10-CM | POA: Insufficient documentation

## 2019-04-24 DIAGNOSIS — E7800 Pure hypercholesterolemia, unspecified: Secondary | ICD-10-CM | POA: Diagnosis not present

## 2019-04-24 DIAGNOSIS — Z833 Family history of diabetes mellitus: Secondary | ICD-10-CM | POA: Insufficient documentation

## 2019-04-24 DIAGNOSIS — R202 Paresthesia of skin: Secondary | ICD-10-CM | POA: Insufficient documentation

## 2019-04-24 DIAGNOSIS — Z87891 Personal history of nicotine dependence: Secondary | ICD-10-CM | POA: Insufficient documentation

## 2019-04-24 DIAGNOSIS — Z992 Dependence on renal dialysis: Secondary | ICD-10-CM | POA: Diagnosis not present

## 2019-04-24 DIAGNOSIS — R200 Anesthesia of skin: Secondary | ICD-10-CM | POA: Insufficient documentation

## 2019-04-24 DIAGNOSIS — X58XXXA Exposure to other specified factors, initial encounter: Secondary | ICD-10-CM | POA: Diagnosis not present

## 2019-04-24 DIAGNOSIS — E119 Type 2 diabetes mellitus without complications: Secondary | ICD-10-CM | POA: Diagnosis not present

## 2019-04-24 DIAGNOSIS — F329 Major depressive disorder, single episode, unspecified: Secondary | ICD-10-CM | POA: Insufficient documentation

## 2019-04-24 DIAGNOSIS — R5381 Other malaise: Secondary | ICD-10-CM | POA: Diagnosis not present

## 2019-04-24 DIAGNOSIS — Z20828 Contact with and (suspected) exposure to other viral communicable diseases: Secondary | ICD-10-CM | POA: Insufficient documentation

## 2019-04-24 DIAGNOSIS — Q2731 Arteriovenous malformation of vessel of upper limb: Secondary | ICD-10-CM | POA: Diagnosis not present

## 2019-04-24 DIAGNOSIS — T82590A Other mechanical complication of surgically created arteriovenous fistula, initial encounter: Secondary | ICD-10-CM | POA: Diagnosis not present

## 2019-04-24 DIAGNOSIS — Z03818 Encounter for observation for suspected exposure to other biological agents ruled out: Secondary | ICD-10-CM | POA: Diagnosis not present

## 2019-04-24 HISTORY — PX: AV FISTULA PLACEMENT: SHX1204

## 2019-04-24 LAB — POCT I-STAT 4, (NA,K, GLUC, HGB,HCT)
Glucose, Bld: 103 mg/dL — ABNORMAL HIGH (ref 70–99)
HCT: 47 % (ref 39.0–52.0)
Hemoglobin: 16 g/dL (ref 13.0–17.0)
Potassium: 4.8 mmol/L (ref 3.5–5.1)
Sodium: 140 mmol/L (ref 135–145)

## 2019-04-24 LAB — GLUCOSE, CAPILLARY: Glucose-Capillary: 79 mg/dL (ref 70–99)

## 2019-04-24 LAB — CBG MONITORING, ED: Glucose-Capillary: 93 mg/dL (ref 70–99)

## 2019-04-24 LAB — SARS CORONAVIRUS 2 BY RT PCR (HOSPITAL ORDER, PERFORMED IN ~~LOC~~ HOSPITAL LAB): SARS Coronavirus 2: NEGATIVE

## 2019-04-24 SURGERY — ARTERIOVENOUS (AV) FISTULA CREATION
Anesthesia: General | Laterality: Right

## 2019-04-24 MED ORDER — FENTANYL CITRATE (PF) 100 MCG/2ML IJ SOLN
INTRAMUSCULAR | Status: DC | PRN
  Administered 2019-04-24 (×2): 50 ug via INTRAVENOUS

## 2019-04-24 MED ORDER — SODIUM CHLORIDE 0.9 % IV SOLN
INTRAVENOUS | Status: AC
  Filled 2019-04-24: qty 1.2

## 2019-04-24 MED ORDER — CEFAZOLIN SODIUM-DEXTROSE 2-3 GM-%(50ML) IV SOLR
INTRAVENOUS | Status: DC | PRN
  Administered 2019-04-24: 2 g via INTRAVENOUS

## 2019-04-24 MED ORDER — PHENYLEPHRINE HCL (PRESSORS) 10 MG/ML IV SOLN
INTRAVENOUS | Status: DC | PRN
  Administered 2019-04-24 (×2): 160 ug via INTRAVENOUS
  Administered 2019-04-24: 80 ug via INTRAVENOUS

## 2019-04-24 MED ORDER — SODIUM CHLORIDE 0.9 % IV SOLN
INTRAVENOUS | Status: DC
  Administered 2019-04-24 (×2): via INTRAVENOUS

## 2019-04-24 MED ORDER — PROMETHAZINE HCL 25 MG/ML IJ SOLN: 6.2500 mg | INTRAMUSCULAR | Status: DC | PRN

## 2019-04-24 MED ORDER — HYDROCODONE-ACETAMINOPHEN 5-325 MG PO TABS: 1.0000 | ORAL_TABLET | Freq: Four times a day (QID) | ORAL | 0 refills | Status: DC | PRN

## 2019-04-24 MED ORDER — LIDOCAINE-EPINEPHRINE (PF) 1 %-1:200000 IJ SOLN
INTRAMUSCULAR | Status: DC | PRN
  Administered 2019-04-24: 5 mL

## 2019-04-24 MED ORDER — LIDOCAINE-EPINEPHRINE (PF) 1 %-1:200000 IJ SOLN
INTRAMUSCULAR | Status: AC
  Filled 2019-04-24: qty 30

## 2019-04-24 MED ORDER — SODIUM CHLORIDE 0.9 % IV SOLN
INTRAVENOUS | Status: DC | PRN
  Administered 2019-04-24: 500 mL

## 2019-04-24 MED ORDER — PROPOFOL 10 MG/ML IV BOLUS
INTRAVENOUS | Status: DC | PRN
  Administered 2019-04-24: 150 mg via INTRAVENOUS
  Administered 2019-04-24: 50 mg via INTRAVENOUS

## 2019-04-24 MED ORDER — HEMOSTATIC AGENTS (NO CHARGE) OPTIME
TOPICAL | Status: DC | PRN
  Administered 2019-04-24: 1 via TOPICAL

## 2019-04-24 MED ORDER — FENTANYL CITRATE (PF) 250 MCG/5ML IJ SOLN
INTRAMUSCULAR | Status: AC
  Filled 2019-04-24: qty 5

## 2019-04-24 MED ORDER — MEPERIDINE HCL 25 MG/ML IJ SOLN: 6.2500 mg | INTRAMUSCULAR | Status: DC | PRN

## 2019-04-24 MED ORDER — FENTANYL CITRATE (PF) 100 MCG/2ML IJ SOLN: 25.0000 ug | INTRAMUSCULAR | Status: DC | PRN

## 2019-04-24 MED ORDER — LIDOCAINE HCL (CARDIAC) PF 100 MG/5ML IV SOSY
PREFILLED_SYRINGE | INTRAVENOUS | Status: DC | PRN
  Administered 2019-04-24: 100 mg via INTRAVENOUS

## 2019-04-24 MED ORDER — ONDANSETRON HCL 4 MG/2ML IJ SOLN
INTRAMUSCULAR | Status: AC
  Filled 2019-04-24: qty 2

## 2019-04-24 MED ORDER — SODIUM CHLORIDE 0.9 % IV SOLN
INTRAVENOUS | Status: DC | PRN
  Administered 2019-04-24: 15:00:00 80 ug/min via INTRAVENOUS

## 2019-04-24 MED ORDER — 0.9 % SODIUM CHLORIDE (POUR BTL) OPTIME
TOPICAL | Status: DC | PRN
  Administered 2019-04-24: 1000 mL

## 2019-04-24 MED ORDER — ALBUMIN HUMAN 5 % IV SOLN
INTRAVENOUS | Status: DC | PRN
  Administered 2019-04-24: 16:00:00 via INTRAVENOUS

## 2019-04-24 MED ORDER — ONDANSETRON HCL 4 MG/2ML IJ SOLN
INTRAMUSCULAR | Status: DC | PRN
  Administered 2019-04-24: 4 mg via INTRAVENOUS

## 2019-04-24 SURGICAL SUPPLY — 34 items
ARMBAND PINK RESTRICT EXTREMIT (MISCELLANEOUS) ×6 IMPLANT
CANISTER SUCT 3000ML PPV (MISCELLANEOUS) ×3 IMPLANT
CLIP VESOCCLUDE MED 6/CT (CLIP) ×3 IMPLANT
CLIP VESOCCLUDE SM WIDE 6/CT (CLIP) ×3 IMPLANT
COVER PROBE W GEL 5X96 (DRAPES) ×3 IMPLANT
COVER WAND RF STERILE (DRAPES) ×3 IMPLANT
DERMABOND ADVANCED (GAUZE/BANDAGES/DRESSINGS) ×2
DERMABOND ADVANCED .7 DNX12 (GAUZE/BANDAGES/DRESSINGS) ×1 IMPLANT
ELECT REM PT RETURN 9FT ADLT (ELECTROSURGICAL) ×3
ELECTRODE REM PT RTRN 9FT ADLT (ELECTROSURGICAL) ×1 IMPLANT
GLOVE BIO SURGEON STRL SZ7.5 (GLOVE) ×3 IMPLANT
GLOVE BIOGEL PI IND STRL 6.5 (GLOVE) ×1 IMPLANT
GLOVE BIOGEL PI IND STRL 7.5 (GLOVE) ×1 IMPLANT
GLOVE BIOGEL PI INDICATOR 6.5 (GLOVE) ×2
GLOVE BIOGEL PI INDICATOR 7.5 (GLOVE) ×2
GLOVE SURG SS PI 7.5 STRL IVOR (GLOVE) ×3 IMPLANT
GOWN STRL REUS W/ TWL LRG LVL3 (GOWN DISPOSABLE) ×3 IMPLANT
GOWN STRL REUS W/ TWL XL LVL3 (GOWN DISPOSABLE) ×2 IMPLANT
GOWN STRL REUS W/TWL LRG LVL3 (GOWN DISPOSABLE) ×6
GOWN STRL REUS W/TWL XL LVL3 (GOWN DISPOSABLE) ×4
HEMOSTAT SNOW SURGICEL 2X4 (HEMOSTASIS) ×3 IMPLANT
KIT BASIN OR (CUSTOM PROCEDURE TRAY) ×3 IMPLANT
KIT TURNOVER KIT B (KITS) ×3 IMPLANT
NS IRRIG 1000ML POUR BTL (IV SOLUTION) ×3 IMPLANT
PACK CV ACCESS (CUSTOM PROCEDURE TRAY) ×3 IMPLANT
PAD ARMBOARD 7.5X6 YLW CONV (MISCELLANEOUS) ×6 IMPLANT
SUT PROLENE 5 0 C 1 24 (SUTURE) ×6 IMPLANT
SUT PROLENE 6 0 CC (SUTURE) ×3 IMPLANT
SUT VIC AB 3-0 SH 27 (SUTURE) ×2
SUT VIC AB 3-0 SH 27X BRD (SUTURE) ×1 IMPLANT
SUT VICRYL 4-0 PS2 18IN ABS (SUTURE) ×3 IMPLANT
TOWEL GREEN STERILE (TOWEL DISPOSABLE) ×3 IMPLANT
UNDERPAD 30X30 (UNDERPADS AND DIAPERS) ×3 IMPLANT
WATER STERILE IRR 1000ML POUR (IV SOLUTION) ×3 IMPLANT

## 2019-04-24 NOTE — Anesthesia Procedure Notes (Signed)
Procedure Name: LMA Insertion Date/Time: 04/24/2019 3:18 PM Performed by: Lakima Dona T, CRNA Pre-anesthesia Checklist: Patient identified, Emergency Drugs available, Suction available and Patient being monitored Patient Re-evaluated:Patient Re-evaluated prior to induction Oxygen Delivery Method: Circle system utilized Preoxygenation: Pre-oxygenation with 100% oxygen Induction Type: IV induction LMA: LMA with gastric port inserted LMA Size: 4.0 Number of attempts: 1 Airway Equipment and Method: Patient positioned with wedge pillow Placement Confirmation: positive ETCO2 and breath sounds checked- equal and bilateral Tube secured with: Tape Dental Injury: Teeth and Oropharynx as per pre-operative assessment

## 2019-04-24 NOTE — Anesthesia Preprocedure Evaluation (Addendum)
Anesthesia Evaluation  Patient identified by MRN, date of birth, ID band Patient awake    Reviewed: Allergy & Precautions, NPO status , Patient's Chart, lab work & pertinent test results  Airway Mallampati: II  TM Distance: >3 FB Neck ROM: Full    Dental  (+) Dental Advisory Given, Teeth Intact   Pulmonary former smoker,    Pulmonary exam normal breath sounds clear to auscultation       Cardiovascular hypertension, Pt. on medications Normal cardiovascular exam Rhythm:Regular Rate:Normal     Neuro/Psych PSYCHIATRIC DISORDERS Depression negative neurological ROS     GI/Hepatic Neg liver ROS, GERD  ,  Endo/Other  diabetes, Type 2  Renal/GU CRFRenal disease  negative genitourinary   Musculoskeletal negative musculoskeletal ROS (+)   Abdominal   Peds negative pediatric ROS (+)  Hematology negative hematology ROS (+)   Anesthesia Other Findings   Reproductive/Obstetrics negative OB ROS                            Anesthesia Physical  Anesthesia Plan  ASA: III  Anesthesia Plan: General   Post-op Pain Management:    Induction: Intravenous  PONV Risk Score and Plan: 3 and Ondansetron, Dexamethasone and Treatment may vary due to age or medical condition  Airway Management Planned: LMA  Additional Equipment: None  Intra-op Plan:   Post-operative Plan: Extubation in OR  Informed Consent: I have reviewed the patients History and Physical, chart, labs and discussed the procedure including the risks, benefits and alternatives for the proposed anesthesia with the patient or authorized representative who has indicated his/her understanding and acceptance.     Dental advisory given  Plan Discussed with: CRNA  Anesthesia Plan Comments:        Anesthesia Quick Evaluation

## 2019-04-24 NOTE — Anesthesia Postprocedure Evaluation (Signed)
Anesthesia Post Note  Patient: Blake [name]  Procedure(s) Performed: REVISION RIGHT ARM ARTERIOVENOUS (AV) FISTULA (Right )     Patient location during evaluation: PACU Anesthesia Type: General Level of consciousness: sedated and patient cooperative Pain management: pain level controlled Vital Signs Assessment: post-procedure vital signs reviewed and stable Respiratory status: spontaneous breathing Cardiovascular status: stable Anesthetic complications: no    Last Vitals:  Vitals:   04/24/19 1630 04/24/19 1645  BP: (!) 147/61   Pulse: 70 69  Resp: 14 17  Temp: 36.7 C   SpO2: 100% 100%    Last Pain:  Vitals:   04/24/19 1645  TempSrc:   PainSc: 0-No pain                 Nolon Nations

## 2019-04-24 NOTE — ED Provider Notes (Signed)
[facility] EMERGENCY DEPARTMENT Provider Note   CSN: [id number] Arrival date & time: 04/24/19  1046    History   Chief Complaint Chief Complaint  Patient presents with  . Vascular Access Problem    HPI Blake [name] is a 73 y.o. male with history of ESRD on dialysis Tuesday Thursday Saturday, hyperlipidemia, hypertension, type 2 diabetes mellitus presents sent from dialysis facility for evaluation of progressively worsening right AV fistula site problems.  He reports that for the last 2 or 3 months he has had lesions that will scab over.  He denies any pain to the sites.  Reports some worsening dilatation to the area.  Telephone encounter from today shows that the patient had a routine referral from his dialysis center which was updated to urgent today.  The medical assistant spoke with someone at the patient's dialysis facility who states that the patient has "aneurysms within the skin on arterial and venous sites" and "it looks bad, it looks like it could burst at any moment ".  Patient reports he was last dialyzed earlier today, received full treatment.  Notes occasional numbness and tingling to his right hand and sometimes feels as though his hand can become cool to touch.     The history is provided by the patient.    Past Medical History:  Diagnosis Date  . Acute osteomyelitis of toe of right foot ([place]) 08/02/2015  . Arthritis    "maybe RUE/hand" (02/08/2018)  . ESRD (end stage renal disease) on dialysis [facility])    "TTS; [place]" (02/08/2018)  . Hyperlipidemia   . Hypertension   . Type II diabetes mellitus [facility])     Patient Active Problem List   Diagnosis Date Noted  . Fluid overload 12/08/2018  . Chronic respiratory failure with hypoxia ([place]) 02/24/2018  . Dyslipidemia associated with type 2 diabetes mellitus ([place]) 02/14/2018  . Type 2 diabetes mellitus with hypertension and end stage renal disease on dialysis ([place]) 02/14/2018  . GERD without  esophagitis 02/14/2018  . Hypomagnesemia 02/08/2018  . Thrombocytopenia ([employer]) 02/07/2018  . Sepsis ([place]) 02/07/2018  . Hypocalcemia 02/07/2018  . Hypokalemia 02/07/2018  . Nausea and vomiting 04/21/2016  . Hyperkalemia 04/21/2016  . Encephalopathy, metabolic [id number]  . Acute pulmonary edema ([name])   . 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 ([place]) 07/07/2014  . Depression 07/07/2014  . Neuropathic pain, leg, bilateral 07/07/2014  . Diabetic retinopathy ([place]) 07/07/2014  . Acute respiratory failure ([place]) 06/28/2014  . 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 ([place]) 10/17/2012  . Hypoxia 10/17/2012  . Hypertension     Past Surgical History:  Procedure Laterality Date  . AMPUTATION TOE Right 08/04/2015   Procedure: RIGHT GREAT TOE AMPUTATION;  Surgeon: [name], MD;  Location: [place];  Service: Orthopedics;  Laterality: Right;  . AV FISTULA PLACEMENT Right 07/05/2014   Procedure: ARTERIOVENOUS BRACHIALCEPHALIC (AV) FISTULA CREATION;  Surgeon: Conrad [place], MD;  Location: [place];  Service: Vascular;  Laterality: Right;  . BACK SURGERY    . ESOPHAGOGASTRODUODENOSCOPY (EGD) WITH PROPOFOL N/A 11/28/2015   Procedure: ESOPHAGOGASTRODUODENOSCOPY (EGD) WITH PROPOFOL;  Surgeon: [name], MD;  Location: [facility];  Service: Endoscopy;  Laterality: N/A;  . INSERTION OF DIALYSIS CATHETER Left 07/05/2014   Procedure: INSERTION OF DIALYSIS CATHETER-LEFT INTERNAL JUGULAR PLACEMENT;  Surgeon: Conrad [place], MD;  Location: [place];  Service: Vascular;  Laterality: Left;  . LUMBAR DISC SURGERY    . REMOVAL  OF A DIALYSIS CATHETER Right 07/05/2014   Procedure: REMOVAL OF A DIALYSIS CATHETER;  Surgeon: Conrad [place], MD;  Location: [place];  Service: Vascular;  Laterality: Right;        Home Medications    Prior to Admission medications   Medication Sig Start Date End Date Taking? Authorizing Provider  aspirin EC 81 MG tablet Take 81 mg by mouth daily.   Yes [provider]  atorvastatin (LIPITOR) 20 MG tablet Take 20 mg by mouth at bedtime.    Yes [provider]  B Complex-C-Folic Acid (NEPHRO VITAMINS) 0.8 MG TABS Take 0.8 mg by mouth daily.    Yes [provider]  Calcium Acetate 667 MG TABS Take 667 mg by mouth 3 (three) times daily with meals.  02/14/18  Yes [provider]  cinacalcet (SENSIPAR) 30 MG tablet Take 30 mg by mouth See admin instructions. Take one tablet (30 mg) by mouth on Tuesday and Saturday with supper 11/23/18  Yes [provider]  insulin regular (NOVOLIN R RELION) 100 units/mL injection Inject 0-0.09 mLs (0-9 Units total) into the skin 3 (three) times daily with meals. CBG < 70: implement hypoglycemia protocol CBG 70 - 120: 0 units CBG 121 - 150: 1 unit CBG 151 - 200: 2 units CBG 201 - 250: 3 units CBG 251 - 300: 5 units CBG 301 - 350: 7 units CBG 351 - 400: 9 units CBG > 400: call MD. 02/13/18  Yes Hongalgi, Lenis Dickinson, MD  ondansetron (ZOFRAN) 4 MG tablet Take 1 tablet (4 mg total) by mouth every 6 (six) hours. Patient taking differently: Take 4 mg by mouth every 8 (eight) hours as needed for nausea.  12/22/18  Yes Pollina, Gwenyth Allegra, MD  pantoprazole (PROTONIX) 40 MG tablet Take 40 mg by mouth 2 (two) times daily.    Yes [provider]  PARoxetine (PAXIL) 10 MG tablet Take 10 mg by mouth daily.   Yes [provider]    Family History Family History  Problem Relation Age of Onset  . Diabetes Father   . Heart attack Father     Social History Social History   Tobacco Use  . Smoking status: Former Smoker    Packs/day: 0.50    Years: 5.00    Pack years: 2.50    Types: Cigarettes    Quit date: 08/24/1979    Years since quitting: 39.6  . Smokeless tobacco: Never Used  Substance Use Topics  . Alcohol use: Not Currently    Alcohol/week: 0.0 standard drinks    Comment: 02/08/2018 "stopped in my 50's"  . Drug use: Never     Allergies   Patient has no known  allergies.   Review of Systems Review of Systems  Constitutional: Negative for chills and fever.  Respiratory: Negative for shortness of breath.   Cardiovascular: Negative for chest pain.  Skin: Positive for wound.  Hematological: Bruises/bleeds easily.  All other systems reviewed and are negative.    Physical Exam Updated Vital Signs BP (!) 141/71   Pulse 74   Temp 98 F (36.7 C)   Resp 16   Ht 5\' 8"  (1.727 m)   Wt 85.7 kg   SpO2 98%   BMI 28.73 kg/m   Physical Exam Vitals signs and nursing note reviewed.  Constitutional:      General: He is not in acute distress.    Appearance: He is well-developed.     Comments: Resting comfortably in bed  HENT:     Head: Normocephalic and atraumatic.  Eyes:     General:        Right eye: No discharge.        Left eye: No discharge.     Conjunctiva/sclera: Conjunctivae normal.  Neck:     Vascular: No JVD.     Trachea: No tracheal deviation.  Cardiovascular:     Rate and Rhythm: Normal rate.     Comments: See below image.  Patient with 2 ulcerations to his right AV fistula with centralized eschar which has lifted from the more distal lesion. Palpable thrill distally.  Diminished right radial pulse, right hand slightly cool to touch. Small amount of oozing blood from puncture sites consistent with earlier hemodialysis Pulmonary:     Effort: Pulmonary effort is normal.  Abdominal:     General: There is no distension.  Musculoskeletal: Normal range of motion.  Skin:    General: Skin is warm and dry.     Capillary Refill: Capillary refill takes less than 2 seconds.     Findings: No erythema.  Neurological:     Mental Status: He is alert.     Comments: Sensation intact to light touch of bilateral upper extremities.  Psychiatric:        Behavior: Behavior normal.        ED Treatments / Results  Labs (all labs ordered are listed, but only abnormal results are displayed) Labs Reviewed  POCT I-STAT 4, (NA,K, GLUC,  HGB,HCT) - Abnormal; Notable for the following components:      Result Value   Potassium 8.5 (*)    Glucose, Bld 106 (*)    All other components within normal limits  POCT I-STAT 4, (NA,K, GLUC, HGB,HCT) - Abnormal; Notable for the following components:   Glucose, Bld 103 (*)    All other components within normal limits  SARS CORONAVIRUS 2 (HOSPITAL ORDER, [facility])  CBC  BASIC METABOLIC PANEL  CBG MONITORING, ED    EKG None  Radiology No results found.  Procedures Procedures (including critical care time)  Medications Ordered in ED Medications  0.9 %  sodium chloride infusion ( Intravenous New Bag/Given 04/24/19 1323)  heparin 6,000 Units in sodium chloride 0.9 % 500 mL irrigation (500 mLs Irrigation Given 04/24/19 1439)  fentaNYL (SUBLIMAZE) 250 MCG/5ML injection (has no administration in time range)  ondansetron (ZOFRAN) 4 MG/2ML injection (has no administration in time range)  lidocaine-EPINEPHrine (XYLOCAINE-EPINEPHrine) 1 %-1:200000 (PF) injection (has no administration in time range)  fentaNYL (SUBLIMAZE) 250 MCG/5ML injection (has no administration in time range)  lidocaine-EPINEPHrine (XYLOCAINE-EPINEPHrine) 1 %-1:200000 (PF) injection (has no administration in time range)  sodium chloride 0.9 % with heparin ADS Med (has no administration in time range)     Initial Impression / Assessment and Plan / ED Course  I have reviewed the triage vital signs and the nursing notes.  Pertinent labs & imaging results that were available during my care of the patient were reviewed by me and considered in my medical decision making (see chart for details).        Sent from dialysis for evaluation of ulcerations to his right AV fistula.  Receive full treatment today.  He is afebrile, vital signs are stable.  He is nontoxic in appearance.  He is neurovascularly intact.  No signs of secondary skin infection on examination. Doubt DVT of the upper  extremity.   11:38 AM CONSULT: Spoke with Dr. Trula Slade with vascular surgery  who will see the patient emergently in the ED.  11:50AM Patient seen and evaluated by Dr. Trula Slade who recommends repair in the OR today and anticipates discharge home afterwards.  Will obtain rapid COVID swab in anticipation of operative procedure.  Patient resting comfortably on reassessment  Final Clinical Impressions(s) / ED Diagnoses   Final diagnoses:  AV fistula ([place])  ESRD (end stage renal disease) ([place])  Complication of vascular access for dialysis, initial encounter    ED Discharge Orders    None       Renita Papa, PA-C 04/24/19 [place], [place], DO 04/24/19 1934

## 2019-04-24 NOTE — Consult Note (Signed)
[facility] [place]  Patient name: Blake [name] MRN: [id number] DOB: [birth year] Sex: male   REQUESTING PROVIDER:    ER   REASON FOR CONSULT:    ESRD   HISTORY OF PRESENT ILLNESS:   Blake [name] is a 72 y.o. male, who was sent over after dialysis for evaluation not a ulcer on his fistula.  He states it has been there for a month.  He has not had any bleeding.  He suffers from diabetes.  He is medically managed for hypertension.  He is on a statin for hypercholesterolemia.  PAST MEDICAL HISTORY    Past Medical History:  Diagnosis Date  . Acute osteomyelitis of toe of right foot ([place]) 08/02/2015  . Arthritis    "maybe RUE/hand" (02/08/2018)  . ESRD (end stage renal disease) on dialysis [facility])    "TTS; [place]" (02/08/2018)  . Hyperlipidemia   . Hypertension   . Type II diabetes mellitus ([place])      FAMILY HISTORY   Family History  Problem Relation Age of Onset  . Diabetes Father   . Heart attack Father     SOCIAL HISTORY:   Social History   Socioeconomic History  . Marital status: Single    Spouse name: Not on file  . Number of children: Not on file  . Years of education: Not on file  . Highest education level: Not on file  Occupational History  . Not on file  Social Needs  . Financial resource strain: Not on file  . Food insecurity    Worry: Not on file    Inability: Not on file  . Transportation needs    Medical: Not on file    Non-medical: Not on file  Tobacco Use  . Smoking status: Former Smoker    Packs/day: 0.50    Years: 5.00    Pack years: 2.50    Types: Cigarettes    Quit date: 08/24/1979    Years since quitting: 39.6  . Smokeless tobacco: Never Used  Substance and Sexual Activity  . Alcohol use: Not Currently    Alcohol/week: 0.0 standard drinks    Comment: 02/08/2018 "stopped in my 50's"  . Drug use: Never  . Sexual activity: Not Currently  Lifestyle  . Physical  activity    Days per week: Not on file    Minutes per session: Not on file  . Stress: Not on file  Relationships  . Social Herbalist on phone: Not on file    Gets together: Not on file    Attends religious service: Not on file    Active member of club or organization: Not on file    Attends meetings of clubs or organizations: Not on file    Relationship status: Not on file  . Intimate partner violence    Fear of current or ex partner: Not on file    Emotionally abused: Not on file    Physically abused: Not on file    Forced sexual activity: Not on file  Other Topics Concern  . Not on file  Social History Narrative  . Not on file    ALLERGIES:    No Known Allergies  CURRENT MEDICATIONS:    No current facility-administered medications for this encounter.    Current Outpatient Medications  Medication Sig Dispense Refill  . aspirin EC 81 MG tablet Take 81 mg by mouth daily.    Marland Kitchen atorvastatin (LIPITOR) 20 MG tablet Take 20  mg by mouth at bedtime.     . B Complex-C-Folic Acid (NEPHRO VITAMINS) 0.8 MG TABS Take 0.8 mg by mouth daily.     . Calcium Acetate 667 MG TABS Take 667 mg by mouth 3 (three) times daily with meals.     . cinacalcet (SENSIPAR) 30 MG tablet Take 30 mg by mouth See admin instructions. Take one tablet (30 mg) by mouth on Tuesday and Saturday with supper    . insulin regular (NOVOLIN R RELION) 100 units/mL injection Inject 0-0.09 mLs (0-9 Units total) into the skin 3 (three) times daily with meals. CBG < 70: implement hypoglycemia protocol CBG 70 - 120: 0 units CBG 121 - 150: 1 unit CBG 151 - 200: 2 units CBG 201 - 250: 3 units CBG 251 - 300: 5 units CBG 301 - 350: 7 units CBG 351 - 400: 9 units CBG > 400: call MD.    . ondansetron (ZOFRAN) 4 MG tablet Take 1 tablet (4 mg total) by mouth every 6 (six) hours. 20 tablet 0  . pantoprazole (PROTONIX) 40 MG tablet Take 40 mg by mouth daily.     . sevelamer carbonate (RENVELA) 800 MG tablet Take 1  tablet (800 mg total) by mouth 3 (three) times daily with meals. (Patient taking differently: Take 800 mg by mouth 5 (five) times daily. 1 tablet three times daily with meals and two times daily with snacks) 90 tablet 11    REVIEW OF SYSTEMS:   [X]  denotes positive finding, [ ]  denotes negative finding Cardiac  Comments:  Chest pain or chest pressure:    Shortness of breath upon exertion:    Short of breath when lying flat:    Irregular heart rhythm:        Vascular    Pain in calf, thigh, or hip brought on by ambulation:    Pain in feet at night that wakes you up from your sleep:     Blood clot in your veins:    Leg swelling:         Pulmonary    Oxygen at home:    Productive cough:     Wheezing:         Neurologic    Sudden weakness in arms or legs:     Sudden numbness in arms or legs:     Sudden onset of difficulty speaking or slurred speech:    Temporary loss of vision in one eye:     Problems with dizziness:         Gastrointestinal    Blood in stool:      Vomited blood:         Genitourinary    Burning when urinating:     Blood in urine:        Psychiatric    Major depression:         Hematologic    Bleeding problems:    Problems with blood clotting too easily:        Skin    Rashes or ulcers:        Constitutional    Fever or chills:     PHYSICAL EXAM:   Vitals:   04/24/19 1049 04/24/19 1051  BP:  138/64  Pulse:  76  Resp:  19  Temp:  97.8 F (36.6 C)  TempSrc:  Oral  SpO2:  98%  Weight: 85.7 kg   Height: 5\' 8"  (1.727 m)     GENERAL: The patient is a well-nourished male,  in no acute distress. The vital signs are documented above. CARDIAC: There is a regular rate and rhythm.  VASCULAR: ulcer x 2 over fistula on right arm PULMONARY: Nonlabored respirations MUSCULOSKELETAL: There are no major deformities or cyanosis. NEUROLOGIC: No focal weakness or paresthesias are detected. SKIN: see photo. PSYCHIATRIC: The patient has a normal affect.     STUDIES:   none  ASSESSMENT and PLAN   Right arm fistula ulceration:  This needs to be repaired in the OR to prevent bleeding.  I will schedule it for this afternoon.  I doubt he will need a catheter afterwards   Leia Alf, MD, FACS [facility] [phone] Pager [phone]

## 2019-04-24 NOTE — ED Triage Notes (Signed)
Pt finished his dialysis today and was here from the center for evaluation of his fistula. Per EMS, it has been "scabbing over and has a possible aneurysm." Denies pain or bleeding at the site. Pt denies CP/SOB. BP 127/65, HR 72, 95% on room air. CBG 121

## 2019-04-24 NOTE — Op Note (Signed)
    Patient name: Blake [name] MRN: [id number] DOB: 1945-10-26 Sex: male  04/24/2019 Pre-operative Diagnosis: Ulcerated, aneurysmal right brachiocephalic fistula Post-operative diagnosis:  Same Surgeon:  Annamarie Major Assistants: [facility] Procedure:   Resection of ulcerated skin and plication of right brachiocephalic fistula Anesthesia: General Blood Loss: Minimal Specimens: None  Findings: 2 aneurysms or underlying the 2 large ulcers on the fistula.  I resected the skin and approximately one third of the aneurysmal tissue and then plicated the remaining portion of the fistula  Indications: The patient was sent to the emergency department by his dialysis center due to concerns over 2 large ulcers over his fistula.  I felt that these need to be intervened on urgently to prevent rupture.  Procedure:  The patient was identified in the holding area and taken to [place] [facility]  The patient was then placed supine on the table. general anesthesia was administered.  The patient was prepped and draped in the usual sterile fashion.  A time out was called and antibiotics were administered.  An elliptical incision was made incorporating the 2 ulcerated areas over top of the 2 aneurysmal areas of his right brachiocephalic fistula.  Cautery was used about subcutaneous tissue.  The fistula including the 2 aneurysms were circumferentially dissected free.  Once I had proximal and distal control, I occluded the fistula with baby Gregory clamps.  I then used an 11 blade to remove the ulcerated skin and subcutaneous tissue.  I then resected approximately 1/3-1/2 of the aneurysmal tissue.  I then closed the vein longitudinally with a running 5-0 Prolene suture in 2 layers.  Prior to completion the appropriate flushing maneuvers were performed.  Once closure was complete the clamps were released.  There was excellent hemostasis.  The wound was then irrigated.  The incision was closed with 2 layers of Vicryl  followed by Dermabond.   Disposition: To PACU stable   V. Annamarie Major, M.D., [facility] of [place] Office: [phone] Pager:  [phone]

## 2019-04-24 NOTE — Telephone Encounter (Signed)
[facility] yesterday, that was updated to urgent on today. S/w Sherry at the center to determine urgency. Judeen Hammans stated that pt has aneurysms with thin skin on arterial and venous sites, stated "arterial has black spots with scabs as well as the venous area but somewhat larger". Also stated "I have been doing this for a long time and it looks bad, it looks like it could burst at any moment" Advised to send pt to nearest emergency room for evaluation. She verbalized understanding and agreed with this plan.

## 2019-04-24 NOTE — Discharge Instructions (Signed)
° °  [facility]  ° °Discharge Instructions ° °AV Fistula or Graft Surgery for Dialysis Access ° °Please refer to the following instructions for your post-procedure care. Your surgeon or physician assistant will discuss any changes with you. ° °Activity ° °You may drive the day following your surgery, if you are comfortable and no longer taking prescription pain medication. Resume full activity as the soreness in your incision resolves. ° °Bathing/Showering ° °You may shower after you go home. Keep your incision dry for 48 hours. Do not soak in a bathtub, hot tub, or swim until the incision heals completely. You may not shower if you have a hemodialysis catheter. ° °Incision Care ° °Clean your incision with mild soap and water after 48 hours. Pat the area dry with a clean towel. You do not need a bandage unless otherwise instructed. Do not apply any ointments or creams to your incision. You may have skin glue on your incision. Do not peel it off. It will come off on its own in about one week. Your arm may swell a bit after surgery. To reduce swelling use pillows to elevate your arm so it is above your heart. Your doctor will tell you if you need to lightly wrap your arm with an ACE bandage. ° °Diet ° °Resume your normal diet. There are not special food restrictions following this procedure. In order to heal from your surgery, it is CRITICAL to get adequate nutrition. Your body requires vitamins, minerals, and protein. Vegetables are the best source of vitamins and minerals. Vegetables also provide the perfect balance of protein. Processed food has little nutritional value, so try to avoid this. ° °Medications ° °Resume taking all of your medications. If your incision is causing pain, you may take over-the counter pain relievers such as acetaminophen (Tylenol). If you were prescribed a stronger pain medication, please be aware these medications can cause nausea and constipation. Prevent  nausea by taking the medication with a snack or meal. Avoid constipation by drinking plenty of fluids and eating foods with high amount of fiber, such as fruits, vegetables, and grains. Do not take Tylenol if you are taking prescription pain medications. ° ° ° ° °Follow up °Your surgeon may want to see you in the office following your access surgery. If so, this will be arranged at the time of your surgery. ° °Please call us immediately for any of the following conditions: ° °Increased pain, redness, drainage (pus) from your incision site °Fever of 101 degrees or higher °Severe or worsening pain at your incision site °Hand pain or numbness. ° °Reduce your risk of vascular disease: ° °Stop smoking. If you would like help, call [facility] at 1-800-QUIT-NOW (1-800-784-8669) or [facility] at 336-586-4000 ° °Manage your cholesterol °Maintain a desired weight °Control your diabetes °Keep your blood pressure down ° °Dialysis ° °It will take several weeks to several months for your new dialysis access to be ready for use. Your surgeon will determine when it is OK to use it. Your nephrologist will continue to direct your dialysis. You can continue to use your Permcath until your new access is ready for use. ° °If you have any questions, please call the office at [phone]. ° °

## 2019-04-24 NOTE — Transfer of Care (Signed)
Immediate Anesthesia Transfer of Care Note  Patient: Blake [name]  Procedure(s) Performed: REVISION RIGHT ARM ARTERIOVENOUS (AV) FISTULA (Right )  Patient Location: PACU  Anesthesia Type:General  Level of Consciousness: awake, alert  and oriented  Airway & Oxygen Therapy: Patient Spontanous Breathing and Patient connected to nasal cannula oxygen  Post-op Assessment: Report given to RN, Post -op Vital signs reviewed and stable and Patient moving all extremities  Post vital signs: Reviewed and stable  Last Vitals:  Vitals Value Taken Time  BP 147/61 04/24/19 1630  Temp 36.7 C 04/24/19 1630  Pulse 68 04/24/19 1638  Resp 15 04/24/19 1638  SpO2 100 % 04/24/19 1638  Vitals shown include unvalidated device data.  Last Pain:  Vitals:   04/24/19 1249  TempSrc:   PainSc: 0-No pain         Complications: No apparent anesthesia complications

## 2019-04-25 ENCOUNTER — Encounter (HOSPITAL_COMMUNITY): Payer: Self-pay | Admitting: Surgery

## 2019-04-26 LAB — POCT I-STAT 4, (NA,K, GLUC, HGB,HCT)
Glucose, Bld: 106 mg/dL — ABNORMAL HIGH (ref 70–99)
HCT: 43 % (ref 39.0–52.0)
Hemoglobin: 14.6 g/dL (ref 13.0–17.0)
Potassium: 8.5 mmol/L (ref 3.5–5.1)
Sodium: 135 mmol/L (ref 135–145)

## 2019-04-29 ENCOUNTER — Emergency Department (HOSPITAL_COMMUNITY): Payer: Medicare Other

## 2019-04-29 ENCOUNTER — Other Ambulatory Visit: Payer: Self-pay

## 2019-04-29 ENCOUNTER — Encounter (HOSPITAL_COMMUNITY): Payer: Self-pay

## 2019-04-29 ENCOUNTER — Inpatient Hospital Stay (HOSPITAL_COMMUNITY)
Admission: EM | Admit: 2019-04-29 | Discharge: 2019-05-06 | DRG: 246 | Disposition: A | Discharge status: Home Health | Payer: Medicare Other | Attending: Internal Medicine | Admitting: Internal Medicine

## 2019-04-29 ENCOUNTER — Inpatient Hospital Stay (HOSPITAL_COMMUNITY): Payer: Medicare Other

## 2019-04-29 DIAGNOSIS — E1165 Type 2 diabetes mellitus with hyperglycemia: Secondary | ICD-10-CM | POA: Diagnosis present

## 2019-04-29 DIAGNOSIS — E875 Hyperkalemia: Secondary | ICD-10-CM | POA: Diagnosis present

## 2019-04-29 DIAGNOSIS — L8990 Pressure ulcer of unspecified site, unspecified stage: Secondary | ICD-10-CM | POA: Insufficient documentation

## 2019-04-29 DIAGNOSIS — R0602 Shortness of breath: Secondary | ICD-10-CM | POA: Diagnosis not present

## 2019-04-29 DIAGNOSIS — G9341 Metabolic encephalopathy: Secondary | ICD-10-CM

## 2019-04-29 DIAGNOSIS — Z79899 Other long term (current) drug therapy: Secondary | ICD-10-CM | POA: Diagnosis not present

## 2019-04-29 DIAGNOSIS — R52 Pain, unspecified: Secondary | ICD-10-CM | POA: Diagnosis not present

## 2019-04-29 DIAGNOSIS — E785 Hyperlipidemia, unspecified: Secondary | ICD-10-CM | POA: Diagnosis not present

## 2019-04-29 DIAGNOSIS — I700 Atherosclerosis of aorta: Secondary | ICD-10-CM | POA: Diagnosis not present

## 2019-04-29 DIAGNOSIS — E8770 Fluid overload, unspecified: Secondary | ICD-10-CM | POA: Diagnosis present

## 2019-04-29 DIAGNOSIS — Z20828 Contact with and (suspected) exposure to other viral communicable diseases: Secondary | ICD-10-CM | POA: Diagnosis present

## 2019-04-29 DIAGNOSIS — Z7982 Long term (current) use of aspirin: Secondary | ICD-10-CM

## 2019-04-29 DIAGNOSIS — I10 Essential (primary) hypertension: Secondary | ICD-10-CM | POA: Diagnosis present

## 2019-04-29 DIAGNOSIS — Z794 Long term (current) use of insulin: Secondary | ICD-10-CM | POA: Diagnosis not present

## 2019-04-29 DIAGNOSIS — Z833 Family history of diabetes mellitus: Secondary | ICD-10-CM | POA: Diagnosis not present

## 2019-04-29 DIAGNOSIS — I4510 Unspecified right bundle-branch block: Secondary | ICD-10-CM | POA: Diagnosis not present

## 2019-04-29 DIAGNOSIS — I214 Non-ST elevation (NSTEMI) myocardial infarction: Secondary | ICD-10-CM

## 2019-04-29 DIAGNOSIS — G9340 Encephalopathy, unspecified: Secondary | ICD-10-CM | POA: Diagnosis present

## 2019-04-29 DIAGNOSIS — E1122 Type 2 diabetes mellitus with diabetic chronic kidney disease: Secondary | ICD-10-CM

## 2019-04-29 DIAGNOSIS — I517 Cardiomegaly: Secondary | ICD-10-CM | POA: Diagnosis not present

## 2019-04-29 DIAGNOSIS — Z955 Presence of coronary angioplasty implant and graft: Secondary | ICD-10-CM

## 2019-04-29 DIAGNOSIS — J810 Acute pulmonary edema: Secondary | ICD-10-CM | POA: Diagnosis present

## 2019-04-29 DIAGNOSIS — J9611 Chronic respiratory failure with hypoxia: Secondary | ICD-10-CM | POA: Diagnosis present

## 2019-04-29 DIAGNOSIS — J811 Chronic pulmonary edema: Secondary | ICD-10-CM | POA: Diagnosis not present

## 2019-04-29 DIAGNOSIS — R9431 Abnormal electrocardiogram [ECG] [EKG]: Secondary | ICD-10-CM | POA: Diagnosis not present

## 2019-04-29 DIAGNOSIS — R55 Syncope and collapse: Secondary | ICD-10-CM | POA: Diagnosis not present

## 2019-04-29 DIAGNOSIS — L89520 Pressure ulcer of left ankle, unstageable: Secondary | ICD-10-CM | POA: Diagnosis not present

## 2019-04-29 DIAGNOSIS — E8889 Other specified metabolic disorders: Secondary | ICD-10-CM | POA: Diagnosis present

## 2019-04-29 DIAGNOSIS — Z87891 Personal history of nicotine dependence: Secondary | ICD-10-CM

## 2019-04-29 DIAGNOSIS — R569 Unspecified convulsions: Secondary | ICD-10-CM | POA: Diagnosis not present

## 2019-04-29 DIAGNOSIS — I2510 Atherosclerotic heart disease of native coronary artery without angina pectoris: Secondary | ICD-10-CM | POA: Diagnosis not present

## 2019-04-29 DIAGNOSIS — I951 Orthostatic hypotension: Secondary | ICD-10-CM | POA: Diagnosis not present

## 2019-04-29 DIAGNOSIS — N186 End stage renal disease: Secondary | ICD-10-CM | POA: Diagnosis not present

## 2019-04-29 DIAGNOSIS — Z992 Dependence on renal dialysis: Secondary | ICD-10-CM

## 2019-04-29 DIAGNOSIS — D631 Anemia in chronic kidney disease: Secondary | ICD-10-CM | POA: Diagnosis present

## 2019-04-29 DIAGNOSIS — I120 Hypertensive chronic kidney disease with stage 5 chronic kidney disease or end stage renal disease: Secondary | ICD-10-CM | POA: Diagnosis present

## 2019-04-29 DIAGNOSIS — T82898A Other specified complication of vascular prosthetic devices, implants and grafts, initial encounter: Secondary | ICD-10-CM | POA: Diagnosis not present

## 2019-04-29 DIAGNOSIS — N2581 Secondary hyperparathyroidism of renal origin: Secondary | ICD-10-CM | POA: Diagnosis present

## 2019-04-29 DIAGNOSIS — J9601 Acute respiratory failure with hypoxia: Secondary | ICD-10-CM | POA: Diagnosis present

## 2019-04-29 DIAGNOSIS — K219 Gastro-esophageal reflux disease without esophagitis: Secondary | ICD-10-CM | POA: Diagnosis present

## 2019-04-29 DIAGNOSIS — R4020 Unspecified coma: Secondary | ICD-10-CM | POA: Diagnosis not present

## 2019-04-29 DIAGNOSIS — R404 Transient alteration of awareness: Secondary | ICD-10-CM | POA: Diagnosis not present

## 2019-04-29 DIAGNOSIS — Z8249 Family history of ischemic heart disease and other diseases of the circulatory system: Secondary | ICD-10-CM

## 2019-04-29 DIAGNOSIS — E8779 Other fluid overload: Secondary | ICD-10-CM | POA: Diagnosis not present

## 2019-04-29 DIAGNOSIS — R0989 Other specified symptoms and signs involving the circulatory and respiratory systems: Secondary | ICD-10-CM | POA: Diagnosis not present

## 2019-04-29 DIAGNOSIS — Z89411 Acquired absence of right great toe: Secondary | ICD-10-CM

## 2019-04-29 DIAGNOSIS — R918 Other nonspecific abnormal finding of lung field: Secondary | ICD-10-CM | POA: Diagnosis not present

## 2019-04-29 LAB — CBC WITH DIFFERENTIAL/PLATELET
Abs Immature Granulocytes: 0.05 10*3/uL (ref 0.00–0.07)
Basophils Absolute: 0 10*3/uL (ref 0.0–0.1)
Basophils Relative: 1 %
Eosinophils Absolute: 0.4 10*3/uL (ref 0.0–0.5)
Eosinophils Relative: 5 %
HCT: 37.5 % — ABNORMAL LOW (ref 39.0–52.0)
Hemoglobin: 11.4 g/dL — ABNORMAL LOW (ref 13.0–17.0)
Immature Granulocytes: 1 %
Lymphocytes Relative: 19 %
Lymphs Abs: 1.5 10*3/uL (ref 0.7–4.0)
MCH: 28.5 pg (ref 26.0–34.0)
MCHC: 30.4 g/dL (ref 30.0–36.0)
MCV: 93.8 fL (ref 80.0–100.0)
Monocytes Absolute: 0.5 10*3/uL (ref 0.1–1.0)
Monocytes Relative: 7 %
Neutro Abs: 5.3 10*3/uL (ref 1.7–7.7)
Neutrophils Relative %: 67 %
Platelets: 180 10*3/uL (ref 150–400)
RBC: 4 MIL/uL — ABNORMAL LOW (ref 4.22–5.81)
RDW: 14.6 % (ref 11.5–15.5)
WBC: 7.7 10*3/uL (ref 4.0–10.5)
nRBC: 0 % (ref 0.0–0.2)

## 2019-04-29 LAB — COMPREHENSIVE METABOLIC PANEL
ALT: 147 U/L — ABNORMAL HIGH (ref 0–44)
AST: 443 U/L — ABNORMAL HIGH (ref 15–41)
Albumin: 3.1 g/dL — ABNORMAL LOW (ref 3.5–5.0)
Alkaline Phosphatase: 168 U/L — ABNORMAL HIGH (ref 38–126)
Anion gap: 25 — ABNORMAL HIGH (ref 5–15)
BUN: 91 mg/dL — ABNORMAL HIGH (ref 8–23)
CO2: 15 mmol/L — ABNORMAL LOW (ref 22–32)
Calcium: 8.5 mg/dL — ABNORMAL LOW (ref 8.9–10.3)
Chloride: 103 mmol/L (ref 98–111)
Creatinine, Ser: 15.2 mg/dL — ABNORMAL HIGH (ref 0.61–1.24)
GFR calc Af Amer: 3 mL/min — ABNORMAL LOW (ref >60)
GFR calc non Af Amer: 3 mL/min — ABNORMAL LOW (ref >60)
Glucose, Bld: 211 mg/dL — ABNORMAL HIGH (ref 70–99)
Potassium: 5.7 mmol/L — ABNORMAL HIGH (ref 3.5–5.1)
Sodium: 143 mmol/L (ref 135–145)
Total Bilirubin: 0.4 mg/dL (ref 0.3–1.2)
Total Protein: 7.1 g/dL (ref 6.5–8.1)

## 2019-04-29 LAB — I-STAT CHEM 8, ED
BUN: 89 mg/dL — ABNORMAL HIGH (ref 8–23)
Calcium, Ion: 1.05 mmol/L — ABNORMAL LOW (ref 1.15–1.40)
Chloride: 110 mmol/L (ref 98–111)
Creatinine, Ser: 15.3 mg/dL — ABNORMAL HIGH (ref 0.61–1.24)
Glucose, Bld: 196 mg/dL — ABNORMAL HIGH (ref 70–99)
HCT: 37 % — ABNORMAL LOW (ref 39.0–52.0)
Hemoglobin: 12.6 g/dL — ABNORMAL LOW (ref 13.0–17.0)
Potassium: 6 mmol/L — ABNORMAL HIGH (ref 3.5–5.1)
Sodium: 143 mmol/L (ref 135–145)
TCO2: 17 mmol/L — ABNORMAL LOW (ref 22–32)

## 2019-04-29 LAB — TROPONIN I (HIGH SENSITIVITY)
Troponin I (High Sensitivity): 169 ng/L (ref <18)
Troponin I (High Sensitivity): 4723 ng/L (ref <18)

## 2019-04-29 LAB — CBG MONITORING, ED
Glucose-Capillary: 191 mg/dL — ABNORMAL HIGH (ref 70–99)
Glucose-Capillary: 129 mg/dL — ABNORMAL HIGH (ref 70–99)

## 2019-04-29 LAB — MAGNESIUM: Magnesium: 2.2 mg/dL (ref 1.7–2.4)

## 2019-04-29 LAB — SARS CORONAVIRUS 2 BY RT PCR (HOSPITAL ORDER, PERFORMED IN ~~LOC~~ HOSPITAL LAB): SARS Coronavirus 2: NEGATIVE

## 2019-04-29 MED ORDER — CALCIUM GLUCONATE-NACL 1-0.675 GM/50ML-% IV SOLN
1.0000 g | Freq: Once | INTRAVENOUS | Status: AC
  Administered 2019-04-29: 1000 mg via INTRAVENOUS
  Filled 2019-04-29: qty 50

## 2019-04-29 MED ORDER — SODIUM ZIRCONIUM CYCLOSILICATE 10 G PO PACK
10.0000 g | PACK | Freq: Four times a day (QID) | ORAL | Status: AC
  Administered 2019-04-29 – 2019-04-30 (×2): 10 g via ORAL
  Filled 2019-04-29 (×2): qty 1

## 2019-04-29 MED ORDER — IOHEXOL 350 MG/ML SOLN
75.0000 mL | Freq: Once | INTRAVENOUS | Status: AC | PRN
  Administered 2019-04-29: 75 mL via INTRAVENOUS

## 2019-04-29 MED ORDER — SODIUM CHLORIDE 0.9 % IV SOLN: 1.0000 g | Freq: Once | INTRAVENOUS | Status: DC

## 2019-04-29 NOTE — ED Provider Notes (Signed)
[facility] EMERGENCY DEPARTMENT Provider Note   CSN: [id number] Arrival date & time: 04/29/19  1910     History   Chief Complaint Chief Complaint  Patient presents with  . Altered Mental Status    HPI Blake [name] is a 73 y.o. male.     HPI  73 year old male presents with dyspnea and altered mental state.  History is from EMS and patient.  EMS reports they were called by family because the patient was altered and was found on the bathroom floor.  He was face down according to EMS.  Altered with a right-sided gaze preference.  No seizure-like activity.  Mental status has slowly improved with oxygen.  He vomited 3 times and had to be suctioned once.  The patient tells me he remembers going to the bathroom but then nothing else.  He did not have dialysis yesterday (Saturday), or Thursday.  He has been having issues with his fistula and had to have a procedure last week.  He started feeling short of breath today.  No fever, cough, chest pain.  Past Medical History:  Diagnosis Date  . Acute osteomyelitis of toe of right foot ([place]) 08/02/2015  . Arthritis    "maybe RUE/hand" (02/08/2018)  . ESRD (end stage renal disease) on dialysis [facility])    "TTS; [place]" (02/08/2018)  . Hyperlipidemia   . Hypertension   . Type II diabetes mellitus [facility])     Patient Active Problem List   Diagnosis Date Noted  . Fluid overload 12/08/2018  . Chronic respiratory failure with hypoxia ([place]) 02/24/2018  . Dyslipidemia associated with type 2 diabetes mellitus ([place]) 02/14/2018  . Type 2 diabetes mellitus with hypertension and end stage renal disease on dialysis ([place]) 02/14/2018  . GERD without esophagitis 02/14/2018  . Hypomagnesemia 02/08/2018  . Thrombocytopenia ([place]) 02/07/2018  . Sepsis ([place]) 02/07/2018  . Hypocalcemia 02/07/2018  . Hypokalemia 02/07/2018  . Nausea and vomiting 04/21/2016  . Hyperkalemia 04/21/2016  . Encephalopathy, metabolic [id number]  . Acute  pulmonary edema ([name])   . 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 ([place]) 07/07/2014  . Depression 07/07/2014  . Neuropathic pain, leg, bilateral 07/07/2014  . Diabetic retinopathy ([place]) 07/07/2014  . Acute respiratory failure ([place]) 06/28/2014  . 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 ([place]) 10/17/2012  . Hypoxia 10/17/2012  . Hypertension     Past Surgical History:  Procedure Laterality Date  . AMPUTATION TOE Right 08/04/2015   Procedure: RIGHT GREAT TOE AMPUTATION;  Surgeon: [name], MD;  Location: [facility];  Service: Orthopedics;  Laterality: Right;  . AV FISTULA PLACEMENT Right 07/05/2014   Procedure: ARTERIOVENOUS BRACHIALCEPHALIC (AV) FISTULA CREATION;  Surgeon: Conrad [place], MD;  Location: [facility];  Service: Vascular;  Laterality: Right;  . AV FISTULA PLACEMENT Right 04/24/2019   Procedure: REVISION RIGHT ARM ARTERIOVENOUS (AV) FISTULA;  Surgeon: Serafina Mitchell, MD;  Location: [facility] [place];  Service: Vascular;  Laterality: Right;  . BACK SURGERY    . ESOPHAGOGASTRODUODENOSCOPY (EGD) WITH PROPOFOL N/A 11/28/2015   Procedure: ESOPHAGOGASTRODUODENOSCOPY (EGD) WITH PROPOFOL;  Surgeon: [name], MD;  Location: [facility];  Service: Endoscopy;  Laterality: N/A;  . INSERTION OF DIALYSIS CATHETER Left 07/05/2014   Procedure: INSERTION OF DIALYSIS CATHETER-LEFT INTERNAL JUGULAR PLACEMENT;  Surgeon: Conrad Pierce, MD;  Location: [facility];  Service: Vascular;  Laterality: Left;  . LUMBAR DISC SURGERY    . REMOVAL OF A DIALYSIS CATHETER Right 07/05/2014  Procedure: REMOVAL OF A DIALYSIS CATHETER;  Surgeon: Conrad [place], MD;  Location: [facility];  Service: Vascular;  Laterality: Right;        Home Medications    Prior to Admission medications   Medication Sig Start Date End Date Taking? Authorizing Provider  aspirin EC 81 MG tablet Take 81 mg by mouth daily.    [provider]  atorvastatin (LIPITOR) 20 MG tablet Take 20 mg by mouth at bedtime.     [provider]  B Complex-C-Folic Acid (NEPHRO VITAMINS) 0.8 MG TABS Take 0.8 mg by mouth daily.     [provider]  Calcium Acetate 667 MG TABS Take 667 mg by mouth 3 (three) times daily with meals.  02/14/18   [provider]  cinacalcet (SENSIPAR) 30 MG tablet Take 30 mg by mouth See admin instructions. Take one tablet (30 mg) by mouth on Tuesday and Saturday with supper 11/23/18   [provider]  HYDROcodone-acetaminophen (NORCO) 5-325 MG tablet Take 1 tablet by mouth every 6 (six) hours as needed for moderate pain. 04/24/19   Dagoberto Ligas, PA-C  insulin regular (NOVOLIN R RELION) 100 units/mL injection Inject 0-0.09 mLs (0-9 Units total) into the skin 3 (three) times daily with meals. CBG < 70: implement hypoglycemia protocol CBG 70 - 120: 0 units CBG 121 - 150: 1 unit CBG 151 - 200: 2 units CBG 201 - 250: 3 units CBG 251 - 300: 5 units CBG 301 - 350: 7 units CBG 351 - 400: 9 units CBG > 400: call MD. 02/13/18   [name], MD  ondansetron (ZOFRAN) 4 MG tablet Take 1 tablet (4 mg total) by mouth every 6 (six) hours. Patient taking differently: Take 4 mg by mouth every 8 (eight) hours as needed for nausea.  12/22/18   Orpah Greek, MD  pantoprazole (PROTONIX) 40 MG tablet Take 40 mg by mouth 2 (two) times daily.     [provider]  PARoxetine (PAXIL) 10 MG tablet Take 10 mg by mouth daily.    [provider]    Family History Family History  Problem Relation Age of Onset  . Diabetes Father   . Heart attack Father     Social History Social History   Tobacco Use  . Smoking status: Former Smoker    Packs/day: 0.50    Years: 5.00    Pack years: 2.50    Types: Cigarettes    Quit date: 08/24/1979    Years since quitting: 39.7  . Smokeless tobacco: Never Used  Substance Use Topics  . Alcohol use: Not Currently    Alcohol/week: 0.0 standard drinks    Comment: 02/08/2018 "stopped in my 50's"  . Drug use: Never      Allergies   Patient has no known allergies.   Review of Systems Review of Systems  Constitutional: Negative for fever.  Respiratory: Positive for shortness of breath. Negative for cough.   Cardiovascular: Negative for chest pain and leg swelling.  Gastrointestinal: Positive for vomiting.  Neurological: Negative for headaches.  Psychiatric/Behavioral: Positive for confusion.  All other systems reviewed and are negative.    Physical Exam Updated Vital Signs BP 118/69   Pulse 90   Temp 98.6 F (37 C) (Oral)   Resp (!) 21   Ht 5\' 8"  (1.727 m)   Wt 86.2 kg   SpO2 100%   BMI 28.89 kg/m   Physical Exam Vitals signs and nursing note reviewed.  Constitutional:  General: He is not in acute distress.    Appearance: He is well-developed. He is not ill-appearing or diaphoretic.  HENT:     Head: Normocephalic and atraumatic.     Right Ear: External ear normal.     Left Ear: External ear normal.     Nose: Nose normal.  Eyes:     General:        Right eye: No discharge.        Left eye: No discharge.     Extraocular Movements: Extraocular movements intact.     Pupils: Pupils are equal, round, and reactive to light.  Neck:     Musculoskeletal: Neck supple.  Cardiovascular:     Rate and Rhythm: Normal rate and regular rhythm.     Heart sounds: Normal heart sounds.  Pulmonary:     Effort: Pulmonary effort is normal.     Breath sounds: Rales present.  Abdominal:     Palpations: Abdomen is soft.     Tenderness: There is no abdominal tenderness.  Musculoskeletal:     Comments: Right arm fistula with mild surrounding redness. Thrill at the inferior aspect only.  Skin:    General: Skin is warm and dry.  Neurological:     Mental Status: He is alert.  Psychiatric:        Mood and Affect: Mood is not anxious.      ED Treatments / Results  Labs (all labs ordered are listed, but only abnormal results are displayed) Labs Reviewed  CBC WITH DIFFERENTIAL/PLATELET -  Abnormal; Notable for the following components:      Result Value   RBC 4.00 (*)    Hemoglobin 11.4 (*)    HCT 37.5 (*)    All other components within normal limits  COMPREHENSIVE METABOLIC PANEL - Abnormal; Notable for the following components:   Potassium 5.7 (*)    CO2 15 (*)    Glucose, Bld 211 (*)    BUN 91 (*)    Creatinine, Ser 15.20 (*)    Calcium 8.5 (*)    Albumin 3.1 (*)    AST 443 (*)    ALT 147 (*)    Alkaline Phosphatase 168 (*)    GFR calc non Af Amer 3 (*)    GFR calc Af Amer 3 (*)    Anion gap 25 (*)    All other components within normal limits  I-STAT CHEM 8, ED - Abnormal; Notable for the following components:   Potassium 6.0 (*)    BUN 89 (*)    Creatinine, Ser 15.30 (*)    Glucose, Bld 196 (*)    Calcium, Ion 1.05 (*)    TCO2 17 (*)    Hemoglobin 12.6 (*)    HCT 37.0 (*)    All other components within normal limits  CBG MONITORING, ED - Abnormal; Notable for the following components:   Glucose-Capillary 191 (*)    All other components within normal limits  CBG MONITORING, ED - Abnormal; Notable for the following components:   Glucose-Capillary 129 (*)    All other components within normal limits  TROPONIN I (HIGH SENSITIVITY) - Abnormal; Notable for the following components:   Troponin I (High Sensitivity) 169 (*)    All other components within normal limits  TROPONIN I (HIGH SENSITIVITY) - Abnormal; Notable for the following components:   Troponin I (High Sensitivity) 4,723 (*)    All other components within normal limits  SARS CORONAVIRUS 2 (HOSPITAL ORDER, PERFORMED IN [facility])  MAGNESIUM  CBC WITH DIFFERENTIAL/PLATELET    EKG EKG Interpretation  Date/Time:  Sunday April 29 2019 19:14:11 EDT Ventricular Rate:  91 PR Interval:    QRS Duration: 164 QT Interval:  465 QTC Calculation: 573 R Axis:   -151 Text Interpretation:  Sinus rhythm RBBB and LPFB RBBB new since April 2020 Confirmed by Sherwood Gambler [id number]) on  04/29/2019 7:43:08 PM   EKG Interpretation  Date/Time:  Sunday April 29 2019 20:35:12 EDT Ventricular Rate:  79 PR Interval:    QRS Duration: 92 QT Interval:  419 QTC Calculation: 481 R Axis:   29 Text Interpretation:  Sinus rhythm Abnormal R-wave progression, early transition Inferior infarct, old ST depression V1-V3, improved compared to earlier RBBB gone compared to earlier in the day Confirmed by Sherwood Gambler [id number]) on 04/29/2019 10:19:33 PM        EKG Interpretation  Date/Time:  Sunday April 29 2019 23:16:45 EDT Ventricular Rate:  73 PR Interval:    QRS Duration: 81 QT Interval:  428 QTC Calculation: 472 R Axis:   42 Text Interpretation:  Sinus rhythm Abnormal R-wave progression, early transition ST depressions improved compared to earlier Confirmed by Sherwood Gambler [phone]) on 04/29/2019 11:19:06 PM       Radiology Ct Head Wo Contrast  Result Date: 04/29/2019 CLINICAL DATA:  Seizure, new, nontraumatic, >40 yrs EXAM: CT HEAD WITHOUT CONTRAST TECHNIQUE: Contiguous axial images were obtained from the base of the skull through the vertex without intravenous contrast. COMPARISON:  Head CT 02/08/2018 FINDINGS: Brain: No intracranial hemorrhage, mass effect, or midline shift. Generalized atrophy. Unchanged chronic small vessel ischemia, moderate for age. No hydrocephalus. The basilar cisterns are patent. No evidence of territorial infarct or acute ischemia. No extra-axial or intracranial fluid collection. Vascular: Atherosclerosis of skullbase vasculature without hyperdense vessel or abnormal calcification. Skull: No fracture or focal lesion. Sinuses/Orbits: Paranasal sinuses and mastoid air cells are clear. The visualized orbits are unremarkable. Left cataract resection. Other: None. IMPRESSION: 1. No acute intracranial abnormality. 2. Unchanged atrophy and chronic small vessel ischemia. Electronically Signed   By: Keith Rake M.D.   On: 04/29/2019 22:33   Dg Chest Portable  1 View  Result Date: 04/29/2019 CLINICAL DATA:  Dyspnea. Syncope. Found unresponsive in bathroom. Missed dialysis. EXAM: PORTABLE CHEST 1 VIEW COMPARISON:  Radiograph 12/10/2018 FINDINGS: Cardiomegaly is unchanged. Unchanged mediastinal contours. Fine reticular opacities throughout both lungs which are more prominent than on prior exam. Small right and possibly left pleural effusions. No confluent airspace disease. No pneumothorax. No acute osseous abnormalities. IMPRESSION: Cardiomegaly with pulmonary edema and small right and possibly left pleural effusions. Electronically Signed   By: Keith Rake M.D.   On: 04/29/2019 20:25    Procedures .Critical Care Performed by: Sherwood Gambler, MD Authorized by: Sherwood Gambler, MD   Critical care provider statement:    Critical care time (minutes):  45   Critical care time was exclusive of:  Separately billable procedures and treating other patients   Critical care was necessary to treat or prevent imminent or life-threatening deterioration of the following conditions:  Cardiac failure, respiratory failure and renal failure   Critical care was time spent personally by me on the following activities:  Discussions with consultants, evaluation of patient's response to treatment, examination of patient, ordering and performing treatments and interventions, ordering and review of laboratory studies, ordering and review of radiographic studies, pulse oximetry, re-evaluation of patient's condition, obtaining history from patient or surrogate and review of old charts   (  including critical care time)  Medications Ordered in ED Medications  sodium zirconium cyclosilicate (LOKELMA) packet 10 g (has no administration in time range)  calcium gluconate 1 g/ 50 mL sodium chloride IVPB (0 g Intravenous Stopped 04/29/19 2030)     Initial Impression / Assessment and Plan / ED Course  I have reviewed the triage vital signs and the nursing notes.  Pertinent labs  & imaging results that were available during my care of the patient were reviewed by me and considered in my medical decision making (see chart for details).        Patient presents after syncope/transient altered mental status.  Possibly a seizure.  At this point, he is appearing awake and alert and is not altered at all.  Nonrebreather was removed and he was placed on 2 L but likely has minimal to no hypoxia.  He does have some pulmonary edema and with 2 missed dialysis sessions this is likely causing his shortness of breath.  He has a new right bundle branch block and so he was given IV calcium while waiting on the potassium.  This did seem to narrow his complex again though his potassium is only 6.0.  I discussed with Dr. Posey Pronto who asked for 2 doses of 10 g of Lokelma, 6 hours apart.  They will dialyze in the morning.  His troponin originally is mildly elevated but probably from dialysis but his second is in the 4000 range.  Given the sharp increase, concerned this could be ACS.  I discussed with cardiology, Dr. Kalman Shan, who will see the patient but given the transient right bundle is also concern for PE in addition to his syncope.  I will order CT angiography.  Otherwise, admit to the hospitalist service.  Blake [name] was evaluated in Emergency Department on 04/29/2019 for the symptoms described in the history of present illness. He was evaluated in the context of the global COVID-19 pandemic, which necessitated consideration that the patient might be at risk for infection with the SARS-CoV-2 virus that causes COVID-19. Institutional protocols and algorithms that pertain to the evaluation of patients at risk for COVID-19 are in a state of rapid change based on information released by regulatory bodies including the CDC and federal and state organizations. These policies and algorithms were followed during the patient's care in the ED.   Final Clinical Impressions(s) / ED Diagnoses   Final  diagnoses:  Hyperkalemia    ED Discharge Orders    None       Sherwood Gambler, MD 04/29/19 2329

## 2019-04-29 NOTE — ED Notes (Signed)
Order was entered on the phillips monitor

## 2019-04-29 NOTE — ED Notes (Signed)
Pt requested CBG checked 129, Phillip informed

## 2019-04-29 NOTE — ED Triage Notes (Signed)
Ems reports that pt was found unresponsive in the bathroom. gcs of 5, then gcs of 9, ems reports missing dialysis 3x times. Last dialysis was 04/24/2019, pt is currently verbal AOx4 upon arrival. 3x episodes of vomiting per ems. Pt has new fistula places on the right arm, places 04/24/2019. New site is red ans warm to touch.

## 2019-04-29 NOTE — ED Notes (Signed)
Pt alert and oriented upon arrival.

## 2019-04-29 NOTE — ED Notes (Signed)
Patient transported to CT 

## 2019-04-29 NOTE — H&P (Signed)
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  Patient has missed 2 prior hemodialysis sessions due to possible vascular access problem.  Patient went to use the bathroom at home and passed out in the bathroom.  Patient could not tell me the events leading to the syncope, or, how long he was out for.  EMS was called and en route to the hospital, patient vomited.  As mentioned above, patient could not give account of events following the syncope and collapse.  Specifically, patient denied chest pain, shortness of breath, fever chills, headache or neck pain.  Sensation to the hospital, patient's was 5.7, BUN was 91, creatinine was 15.3, calcium was 8.5 with ionized calcium of 1.05, magnesium of 2.2.  High sensitive troponin of 169 and hemoglobin of 11.4 g/dL.  Initial EKG on presentation revealed right bundle branch block with ST segment depression V1 to V3, but these changes are resolved (within an hour).  Patient was given IV calcium on presentation.  ER provider has already discussed with nephrology team, patient will be dialyzed in the morning.  Cardiology team is also been consulted to see the patient.  Hospitalist team has been asked to admit patient for further assessment and management.  ED Course: On presentation to the hospital, patient was afebrile.  Blood pressure is 145/91, O2 99%.  The plan is for hemodialysis in the morning.  Altered mentation seems to have resolved significantly.  No further nausea or vomiting.  No asterixis.  ER  team has discussed case with nephrology.  Cardiology team has been consulted.  Pertinent labs: Sodium is 143, potassium of 5.7, chloride 100, CO2 15, BUN of 91, creatinine of 15.2, blood sugar of 211.  High sensitive troponin is as documented above.  CBC reveals WBC of 7.7, hemoglobin of 11.4, hematocrit of 37.5, MCV of 93.8 with platelet count of 180.  CT head has not shown any acute findings.  Chest x-ray revealed cardiomegaly with pulmonary edema and small right and possible left pleural effusions.  EKG: Independently reviewed.  2 EKGs reviewed.  Cardiology consulted.  Imaging: independently reviewed.   Review of Systems: Limited history due to initial altered mentation.  However, patient denied fever, visual changes, sore throat, rash, new muscle aches, chest pain, SOB, dysuria, bleeding, abdominal pain.  Past Medical History:  Diagnosis Date   Acute osteomyelitis of toe of right foot ([place]) 08/02/2015   Arthritis    "maybe RUE/hand" (02/08/2018)   ESRD (end stage renal disease) on dialysis ([place])    "TTS; [place]" (02/08/2018)   Hyperlipidemia    Hypertension    Type II diabetes mellitus ([place])     Past Surgical History:  Procedure Laterality Date   AMPUTATION TOE Right 08/04/2015   Procedure: RIGHT GREAT TOE AMPUTATION;  Surgeon: [name], MD;  Location: [facility];  Service: Orthopedics;  Laterality: Right;   AV FISTULA PLACEMENT Right 07/05/2014   Procedure: ARTERIOVENOUS BRACHIALCEPHALIC (AV) FISTULA CREATION;  Surgeon: Conrad Blacksburg, MD;  Location: [facility];  Service: Vascular;  Laterality: Right;   AV FISTULA PLACEMENT Right 04/24/2019   Procedure: REVISION RIGHT ARM ARTERIOVENOUS (AV) FISTULA;  Surgeon: Serafina Mitchell, MD;  [facility] OR;  Service: Vascular;  Laterality: Right;   BACK SURGERY     ESOPHAGOGASTRODUODENOSCOPY (EGD) WITH PROPOFOL N/A 11/28/2015   Procedure: ESOPHAGOGASTRODUODENOSCOPY (EGD) WITH PROPOFOL;  Surgeon: [name], MD;  Location: [facility]  ENDOSCOPY;  Service: Endoscopy;  Laterality: N/A;   INSERTION OF DIALYSIS CATHETER Left 07/05/2014   Procedure: INSERTION OF DIALYSIS CATHETER-LEFT INTERNAL JUGULAR PLACEMENT;  Surgeon: Conrad Branchville, MD;  Location: [facility];  Service: Vascular;  Laterality: Left;   LUMBAR DISC SURGERY     REMOVAL OF A DIALYSIS CATHETER Right 07/05/2014   Procedure: REMOVAL OF A DIALYSIS CATHETER;  Surgeon: Conrad Phillipstown, MD;  Location: [facility];  Service: Vascular;  Laterality: Right;     reports that he quit smoking about 39 years ago. His smoking use included cigarettes. He has a 2.50 pack-year smoking history. He has never used smokeless tobacco. He reports previous alcohol use. He reports that he does not use drugs.  No Known Allergies  Family History  Problem Relation Age of Onset   Diabetes Father    Heart attack Father      Prior to Admission medications   Medication Sig Start Date End Date Taking? Authorizing Provider  aspirin EC 81 MG tablet Take 81 mg by mouth daily.    [provider]  atorvastatin (LIPITOR) 20 MG tablet Take 20 mg by mouth at bedtime.     [provider]  B Complex-C-Folic Acid (NEPHRO VITAMINS) 0.8 MG TABS Take 0.8 mg by mouth daily.     [provider]  Calcium Acetate 667 MG TABS Take 667 mg by mouth 3 (three) times daily with meals.  02/14/18   [provider]  cinacalcet (SENSIPAR) 30 MG tablet Take 30 mg by mouth See admin instructions. Take one tablet (30 mg) by mouth on Tuesday and Saturday with supper 11/23/18   [provider]  HYDROcodone-acetaminophen (NORCO) 5-325 MG tablet Take 1 tablet by mouth every 6 (six) hours as needed for moderate pain. 04/24/19   Dagoberto Ligas, PA-C  insulin regular (NOVOLIN R RELION) 100 units/mL injection Inject 0-0.09 mLs (0-9 Units total) into the skin 3 (three) times daily with meals. CBG < 70: implement hypoglycemia protocol CBG 70 - 120: 0 units CBG 121 - 150: 1 unit CBG 151 - 200: 2  units CBG 201 - 250: 3 units CBG 251 - 300: 5 units CBG 301 - 350: 7 units CBG 351 - 400: 9 units CBG > 400: call MD. 02/13/18   [name], MD  ondansetron (ZOFRAN) 4 MG tablet Take 1 tablet (4 mg total) by mouth every 6 (six) hours. Patient taking differently: Take 4 mg by mouth every 8 (eight) hours as needed for nausea.  12/22/18   Orpah Greek, MD  pantoprazole (PROTONIX) 40 MG tablet Take 40 mg by mouth 2 (two) times daily.     [provider]  PARoxetine (PAXIL) 10 MG tablet Take 10 mg by mouth daily.    [provider]    Physical Exam: Vitals:   04/29/19 2100 04/29/19 2115 04/29/19 2145 04/29/19 2200  BP: 132/62 125/66  (!) 145/91  Pulse: 74 74 72 72  Resp: 14 15 17 16   Temp:      TempSrc:      SpO2: 100% 99% 98% 99%  Weight:      Height:        Constitutional:   Appears calm and comfortable Eyes:  Mild pallor. No jaundice.  ENMT:   external ears, nose appear normal Neck:   Neck is supple. No JVD Respiratory:   CTA bilaterally, no w/r/r.   Respiratory effort normal. No retractions or accessory muscle use Cardiovascular:   S1S2  No LE extremity edema.  Amputation of the right great toe Abdomen:   Abdomen is soft and non tender. Organs are difficult to assess. Neurologic:   Awake and alert.  Moves all limbs.  Wt Readings from Last 3 Encounters:  04/29/19 86.2 kg  04/24/19 85.7 kg  12/22/18 92.5 kg    I have personally reviewed following labs and imaging studies  Labs on Admission:  CBC: Recent Labs  Lab 04/24/19 1336 04/24/19 1353 04/29/19 1938 04/29/19 1950  WBC  --   --  7.7  --   NEUTROABS  --   --  5.3  --   HGB 14.6 16.0 11.4* 12.6*  HCT 43.0 47.0 37.5* 37.0*  MCV  --   --  93.8  --   PLT  --   --  180  --    Basic Metabolic Panel: Recent Labs  Lab 04/24/19 1336 04/24/19 1353 04/29/19 1938 04/29/19 1950  NA 135 140 143 143  K 8.5* 4.8 5.7* 6.0*  CL  --   --  103 110  CO2  --   --   15*  --   GLUCOSE 106* 103* 211* 196*  BUN  --   --  91* 89*  CREATININE  --   --  15.20* 15.30*  CALCIUM  --   --  8.5*  --   MG  --   --  2.2  --    Liver Function Tests: Recent Labs  Lab 04/29/19 1938  AST 443*  ALT 147*  ALKPHOS 168*  BILITOT 0.4  PROT 7.1  ALBUMIN 3.1*   No results for input(s): LIPASE, AMYLASE in the last 168 hours. No results for input(s): AMMONIA in the last 168 hours. Coagulation Profile: No results for input(s): INR, PROTIME in the last 168 hours. Cardiac Enzymes: No results for input(s): CKTOTAL, CKMB, CKMBINDEX, TROPONINI in the last 168 hours. BNP (last 3 results) No results for input(s): PROBNP in the last 8760 hours. HbA1C: No results for input(s): HGBA1C in the last 72 hours. CBG: Recent Labs  Lab 04/24/19 1245 04/24/19 1631 04/29/19 1937  GLUCAP 93 79 191*   Lipid Profile: No results for input(s): CHOL, HDL, LDLCALC, TRIG, CHOLHDL, LDLDIRECT in the last 72 hours. Thyroid Function Tests: No results for input(s): TSH, T4TOTAL, FREET4, T3FREE, THYROIDAB in the last 72 hours. Anemia Panel: No results for input(s): VITAMINB12, FOLATE, FERRITIN, TIBC, IRON, RETICCTPCT in the last 72 hours. Urine analysis:    Component Value Date/Time   COLORURINE YELLOW 08/01/2015 [place] 08/01/2015 0642   LABSPEC 1.013 08/01/2015 0642   PHURINE 8.0 08/01/2015 0642   GLUCOSEU 100 (A) 08/01/2015 0642   HGBUR MODERATE (A) 08/01/2015 0642   BILIRUBINUR NEGATIVE 08/01/2015 0642   KETONESUR NEGATIVE 08/01/2015 0642   PROTEINUR 100 (A) 08/01/2015 0642   UROBILINOGEN 0.2 06/28/2014 1417   NITRITE NEGATIVE 08/01/2015 0642   LEUKOCYTESUR NEGATIVE 08/01/2015 0642   Sepsis Labs: @LABRCNTIP (procalcitonin:4,lacticidven:4) ) Recent Results (from the past 240 hour(s))  SARS Coronavirus 2 [facility] order, Performed in [facility] hospital lab) Nasopharyngeal Nasopharyngeal Swab     Status: None   Collection Time: 04/24/19 12:08 PM    Specimen: Nasopharyngeal Swab  Result Value Ref Range Status  SARS Coronavirus 2 NEGATIVE NEGATIVE Final    Comment: (NOTE) If result is NEGATIVE SARS-CoV-2 target nucleic acids are NOT DETECTED. The SARS-CoV-2 RNA is generally detectable in upper and lower  respiratory specimens during the acute phase of infection. The lowest  concentration of SARS-CoV-2 viral copies this assay can detect is 250  copies / mL. A negative result does not preclude SARS-CoV-2 infection  and should not be used as the sole basis for treatment or other  patient management decisions.  A negative result may occur with  improper specimen collection / handling, submission of specimen other  than nasopharyngeal swab, presence of viral mutation(s) within the  areas targeted by this assay, and inadequate number of viral copies  (<250 copies / mL). A negative result must be combined with clinical  observations, patient history, and epidemiological information. If result is POSITIVE SARS-CoV-2 target nucleic acids are DETECTED. The SARS-CoV-2 RNA is generally detectable in upper and lower  respiratory specimens dur ing the acute phase of infection.  Positive  results are indicative of active infection with SARS-CoV-2.  Clinical  correlation with patient history and other diagnostic information is  necessary to determine patient infection status.  Positive results do  not rule out bacterial infection or co-infection with other viruses. If result is PRESUMPTIVE POSTIVE SARS-CoV-2 nucleic acids MAY BE PRESENT.   A presumptive positive result was obtained on the submitted specimen  and confirmed on repeat testing.  While 2019 novel coronavirus  (SARS-CoV-2) nucleic acids may be present in the submitted sample  additional confirmatory testing may be necessary for epidemiological  and / or clinical management purposes  to differentiate between  SARS-CoV-2 and other Sarbecovirus currently known to infect humans.  If  clinically indicated additional testing with an alternate test  methodology [phone]) is advised. The SARS-CoV-2 RNA is generally  detectable in upper and lower respiratory sp ecimens during the acute  phase of infection. The expected result is Negative. Fact Sheet for Patients:  StrictlyIdeas.no Fact Sheet for Healthcare Providers: BankingDealers.co.za This test is not yet approved or cleared by the [place] FDA and has been authorized for detection and/or diagnosis of SARS-CoV-2 by FDA under an Emergency Use Authorization (EUA).  This EUA will remain in effect (meaning this test can be used) for the duration of the COVID-19 declaration under Section 564(b)(1) of the Act, 21 U.S.C. section 360bbb-3(b)(1), unless the authorization is terminated or revoked sooner. Performed at [name], [place] [address] [place], [place] [address]   SARS Coronavirus 2 [facility] order, Performed in [facility] hospital lab) Nasopharyngeal Nasopharyngeal Swab     Status: None   Collection Time: 04/29/19  7:22 PM   Specimen: Nasopharyngeal Swab  Result Value Ref Range Status   SARS Coronavirus 2 NEGATIVE NEGATIVE Final    Comment: (NOTE) If result is NEGATIVE SARS-CoV-2 target nucleic acids are NOT DETECTED. The SARS-CoV-2 RNA is generally detectable in upper and lower  respiratory specimens during the acute phase of infection. The lowest  concentration of SARS-CoV-2 viral copies this assay can detect is 250  copies / mL. A negative result does not preclude SARS-CoV-2 infection  and should not be used as the sole basis for treatment or other  patient management decisions.  A negative result may occur with  improper specimen collection / handling, submission of specimen other  than nasopharyngeal swab, presence of viral mutation(s) within the  areas targeted by this assay, and inadequate number of viral copies  (<250 copies /  mL). A negative  result must be combined with clinical  observations, patient history, and epidemiological information. If result is POSITIVE SARS-CoV-2 target nucleic acids are DETECTED. The SARS-CoV-2 RNA is generally detectable in upper and lower  respiratory specimens dur ing the acute phase of infection.  Positive  results are indicative of active infection with SARS-CoV-2.  Clinical  correlation with patient history and other diagnostic information is  necessary to determine patient infection status.  Positive results do  not rule out bacterial infection or co-infection with other viruses. If result is PRESUMPTIVE POSTIVE SARS-CoV-2 nucleic acids MAY BE PRESENT.   A presumptive positive result was obtained on the submitted specimen  and confirmed on repeat testing.  While 2019 novel coronavirus  (SARS-CoV-2) nucleic acids may be present in the submitted sample  additional confirmatory testing may be necessary for epidemiological  and / or clinical management purposes  to differentiate between  SARS-CoV-2 and other Sarbecovirus currently known to infect humans.  If clinically indicated additional testing with an alternate test  methodology [phone]) is advised. The SARS-CoV-2 RNA is generally  detectable in upper and lower respiratory sp ecimens during the acute  phase of infection. The expected result is Negative. Fact Sheet for Patients:  StrictlyIdeas.no Fact Sheet for Healthcare Providers: BankingDealers.co.za This test is not yet approved or cleared by the [place] FDA and has been authorized for detection and/or diagnosis of SARS-CoV-2 by FDA under an Emergency Use Authorization (EUA).  This EUA will remain in effect (meaning this test can be used) for the duration of the COVID-19 declaration under Section 564(b)(1) of the Act, 21 U.S.C. section 360bbb-3(b)(1), unless the authorization is terminated or revoked sooner. Performed at [facility], [place] [address] [place], [place] [address]       Radiological Exams on Admission: Ct Head Wo Contrast  Result Date: 04/29/2019 CLINICAL DATA:  Seizure, new, nontraumatic, >40 yrs EXAM: CT HEAD WITHOUT CONTRAST TECHNIQUE: Contiguous axial images were obtained from the base of the skull through the vertex without intravenous contrast. COMPARISON:  Head CT 02/08/2018 FINDINGS: Brain: No intracranial hemorrhage, mass effect, or midline shift. Generalized atrophy. Unchanged chronic small vessel ischemia, moderate for age. No hydrocephalus. The basilar cisterns are patent. No evidence of territorial infarct or acute ischemia. No extra-axial or intracranial fluid collection. Vascular: Atherosclerosis of skullbase vasculature without hyperdense vessel or abnormal calcification. Skull: No fracture or focal lesion. Sinuses/Orbits: Paranasal sinuses and mastoid air cells are clear. The visualized orbits are unremarkable. Left cataract resection. Other: None. IMPRESSION: 1. No acute intracranial abnormality. 2. Unchanged atrophy and chronic small vessel ischemia. Electronically Signed   By: Keith Rake M.D.   On: 04/29/2019 22:33   Dg Chest Portable 1 View  Result Date: 04/29/2019 CLINICAL DATA:  Dyspnea. Syncope. Found unresponsive in bathroom. Missed dialysis. EXAM: PORTABLE CHEST 1 VIEW COMPARISON:  Radiograph 12/10/2018 FINDINGS: Cardiomegaly is unchanged. Unchanged mediastinal contours. Fine reticular opacities throughout both lungs which are more prominent than on prior exam. Small right and possibly left pleural effusions. No confluent airspace disease. No pneumothorax. No acute osseous abnormalities. IMPRESSION: Cardiomegaly with pulmonary edema and small right and possibly left pleural effusions. Electronically Signed   By: Keith Rake M.D.   On: 04/29/2019 20:25    EKG: Independently reviewed.   Active Problems:   * No active hospital problems. *   Assessment/Plan Syncope  and collapse/altered mental status (acute encephalopathy): Admit patient for further assessment and management. Etiology unclear, but likely multifactorial. Patient  has been his hemodialysis a couple of times. Patient has electrolyte abnormalities. Cannot entirely rule out uremia Cannot rule out cardiac etiology The plan is to proceed with hemodialysis in the morning Cardiology team will see patient and advise.  We will continue to cycle troponin Further management will depend on hospital course.  Guarded prognosis.  ESRD on hemodialysis: Patient has missed 2 prior hemodialysis. For hemodialysis in the morning. Continue to assess and monitor for possible urinary  Rule out acute coronary syndrome: EKG changes noted Elevated high sensitive troponin Cardiology consulted. Further management depend on hospital course  Hyperkalemia: Nephrology team is advised [place]. Continue to monitor electrolytes closely.  Diabetes mellitus: Continue to optimize.  Hypertension: Continue to optimize.  DVT prophylaxis: Subcutaneous heparin Code Status: Full code Family Communication:  Disposition Plan: Home eventually Consults called: Neurology and cardiology Admission status: Inpatient  Time spent: 65 minutes  Dana Allan, MD  [facility] Pager #: [phone] 7PM-7AM contact night coverage as above  04/29/2019, 10:45 PM

## 2019-04-30 ENCOUNTER — Inpatient Hospital Stay (HOSPITAL_COMMUNITY): Payer: Medicare Other

## 2019-04-30 ENCOUNTER — Inpatient Hospital Stay (HOSPITAL_COMMUNITY): Payer: Medicare Other | Admitting: Certified Registered Nurse Anesthetist

## 2019-04-30 ENCOUNTER — Encounter (HOSPITAL_COMMUNITY)
Admission: EM | Disposition: A | Discharge status: Home Health | Payer: Self-pay | Source: Home / Self Care | Attending: Internal Medicine

## 2019-04-30 DIAGNOSIS — T82898A Other specified complication of vascular prosthetic devices, implants and grafts, initial encounter: Secondary | ICD-10-CM

## 2019-04-30 DIAGNOSIS — R55 Syncope and collapse: Secondary | ICD-10-CM

## 2019-04-30 DIAGNOSIS — Z992 Dependence on renal dialysis: Secondary | ICD-10-CM

## 2019-04-30 DIAGNOSIS — R9431 Abnormal electrocardiogram [ECG] [EKG]: Secondary | ICD-10-CM

## 2019-04-30 DIAGNOSIS — J810 Acute pulmonary edema: Secondary | ICD-10-CM

## 2019-04-30 DIAGNOSIS — I214 Non-ST elevation (NSTEMI) myocardial infarction: Secondary | ICD-10-CM

## 2019-04-30 DIAGNOSIS — N186 End stage renal disease: Secondary | ICD-10-CM

## 2019-04-30 HISTORY — PX: ULTRASOUND GUIDANCE FOR VASCULAR ACCESS: SHX6516

## 2019-04-30 HISTORY — PX: INSERTION OF DIALYSIS CATHETER: SHX1324

## 2019-04-30 LAB — CBC
HCT: 34.8 % — ABNORMAL LOW (ref 39.0–52.0)
HCT: 36.3 % — ABNORMAL LOW (ref 39.0–52.0)
Hemoglobin: 10.9 g/dL — ABNORMAL LOW (ref 13.0–17.0)
Hemoglobin: 11.3 g/dL — ABNORMAL LOW (ref 13.0–17.0)
MCH: 28.6 pg (ref 26.0–34.0)
MCH: 28.6 pg (ref 26.0–34.0)
MCHC: 31.3 g/dL (ref 30.0–36.0)
MCHC: 31.1 g/dL (ref 30.0–36.0)
MCV: 91.3 fL (ref 80.0–100.0)
MCV: 91.9 fL (ref 80.0–100.0)
Platelets: 180 10*3/uL (ref 150–400)
Platelets: 147 10*3/uL — ABNORMAL LOW (ref 150–400)
RBC: 3.81 MIL/uL — ABNORMAL LOW (ref 4.22–5.81)
RBC: 3.95 MIL/uL — ABNORMAL LOW (ref 4.22–5.81)
RDW: 14.7 % (ref 11.5–15.5)
RDW: 15 % (ref 11.5–15.5)
WBC: 8.6 10*3/uL (ref 4.0–10.5)
WBC: 6.4 10*3/uL (ref 4.0–10.5)
nRBC: 0 % (ref 0.0–0.2)
nRBC: 0 % (ref 0.0–0.2)

## 2019-04-30 LAB — BASIC METABOLIC PANEL
Anion gap: 19 — ABNORMAL HIGH (ref 5–15)
BUN: 92 mg/dL — ABNORMAL HIGH (ref 8–23)
CO2: 16 mmol/L — ABNORMAL LOW (ref 22–32)
Calcium: 8.2 mg/dL — ABNORMAL LOW (ref 8.9–10.3)
Chloride: 106 mmol/L (ref 98–111)
Creatinine, Ser: 16.04 mg/dL — ABNORMAL HIGH (ref 0.61–1.24)
GFR calc Af Amer: 3 mL/min — ABNORMAL LOW (ref >60)
GFR calc non Af Amer: 3 mL/min — ABNORMAL LOW (ref >60)
Glucose, Bld: 125 mg/dL — ABNORMAL HIGH (ref 70–99)
Potassium: 4.8 mmol/L (ref 3.5–5.1)
Sodium: 141 mmol/L (ref 135–145)

## 2019-04-30 LAB — RENAL FUNCTION PANEL
Albumin: 3 g/dL — ABNORMAL LOW (ref 3.5–5.0)
Anion gap: 18 — ABNORMAL HIGH (ref 5–15)
BUN: 93 mg/dL — ABNORMAL HIGH (ref 8–23)
CO2: 19 mmol/L — ABNORMAL LOW (ref 22–32)
Calcium: 8.5 mg/dL — ABNORMAL LOW (ref 8.9–10.3)
Chloride: 107 mmol/L (ref 98–111)
Creatinine, Ser: 15.37 mg/dL — ABNORMAL HIGH (ref 0.61–1.24)
GFR calc Af Amer: 3 mL/min — ABNORMAL LOW (ref >60)
GFR calc non Af Amer: 3 mL/min — ABNORMAL LOW (ref >60)
Glucose, Bld: 125 mg/dL — ABNORMAL HIGH (ref 70–99)
Phosphorus: 8.6 mg/dL — ABNORMAL HIGH (ref 2.5–4.6)
Potassium: 5.6 mmol/L — ABNORMAL HIGH (ref 3.5–5.1)
Sodium: 144 mmol/L (ref 135–145)

## 2019-04-30 LAB — CBG MONITORING, ED
Glucose-Capillary: 112 mg/dL — ABNORMAL HIGH (ref 70–99)
Glucose-Capillary: 67 mg/dL — ABNORMAL LOW (ref 70–99)
Glucose-Capillary: 87 mg/dL (ref 70–99)
Glucose-Capillary: 70 mg/dL (ref 70–99)

## 2019-04-30 LAB — GLUCOSE, CAPILLARY
Glucose-Capillary: 116 mg/dL — ABNORMAL HIGH (ref 70–99)
Glucose-Capillary: 56 mg/dL — ABNORMAL LOW (ref 70–99)
Glucose-Capillary: 112 mg/dL — ABNORMAL HIGH (ref 70–99)
Glucose-Capillary: 129 mg/dL — ABNORMAL HIGH (ref 70–99)
Glucose-Capillary: 129 mg/dL — ABNORMAL HIGH (ref 70–99)
Glucose-Capillary: 94 mg/dL (ref 70–99)

## 2019-04-30 LAB — MAGNESIUM: Magnesium: 2.2 mg/dL (ref 1.7–2.4)

## 2019-04-30 LAB — HEPARIN LEVEL (UNFRACTIONATED): Heparin Unfractionated: 1.09 IU/mL — ABNORMAL HIGH (ref 0.30–0.70)

## 2019-04-30 LAB — ECHOCARDIOGRAM COMPLETE
Height: 68 in
Weight: 3040.58 oz

## 2019-04-30 LAB — TROPONIN I (HIGH SENSITIVITY)
Troponin I (High Sensitivity): 18435 ng/L (ref <18)
Troponin I (High Sensitivity): 8140 ng/L (ref <18)

## 2019-04-30 LAB — MRSA PCR SCREENING: MRSA by PCR: NEGATIVE

## 2019-04-30 LAB — TSH: TSH: 0.436 u[IU]/mL (ref 0.350–4.500)

## 2019-04-30 SURGERY — INSERTION OF DIALYSIS CATHETER
Anesthesia: Monitor Anesthesia Care

## 2019-04-30 MED ORDER — HEPARIN BOLUS VIA INFUSION
4000.0000 [IU] | Freq: Once | INTRAVENOUS | Status: AC
  Administered 2019-04-30: 4000 [IU] via INTRAVENOUS
  Filled 2019-04-30: qty 4000

## 2019-04-30 MED ORDER — RENA-VITE PO TABS
1.0000 | ORAL_TABLET | Freq: Every day | ORAL | Status: DC
  Administered 2019-04-30 – 2019-05-05 (×7): 1 via ORAL
  Filled 2019-04-30 (×8): qty 1

## 2019-04-30 MED ORDER — DEXTROSE 50 % IV SOLN
INTRAVENOUS | Status: AC
  Administered 2019-04-30: 50 mL via INTRAVENOUS
  Filled 2019-04-30: qty 50

## 2019-04-30 MED ORDER — FENTANYL CITRATE (PF) 100 MCG/2ML IJ SOLN
INTRAMUSCULAR | Status: DC | PRN
  Administered 2019-04-30 (×2): 25 ug via INTRAVENOUS

## 2019-04-30 MED ORDER — ASPIRIN 81 MG PO CHEW: 81.0000 mg | CHEWABLE_TABLET | ORAL | Status: DC

## 2019-04-30 MED ORDER — SODIUM BICARBONATE 650 MG PO TABS
1300.0000 mg | ORAL_TABLET | Freq: Two times a day (BID) | ORAL | Status: DC
  Administered 2019-04-30: 02:00:00 1300 mg via ORAL
  Filled 2019-04-30 (×2): qty 2

## 2019-04-30 MED ORDER — INSULIN ASPART 100 UNIT/ML ~~LOC~~ SOLN: 0.0000 [IU] | SUBCUTANEOUS | Status: DC

## 2019-04-30 MED ORDER — SODIUM CHLORIDE 0.9 % IV SOLN
INTRAVENOUS | Status: AC
  Filled 2019-04-30: qty 1.2

## 2019-04-30 MED ORDER — MIDODRINE HCL 5 MG PO TABS: 10.0000 mg | ORAL_TABLET | Freq: Once | ORAL | Status: DC

## 2019-04-30 MED ORDER — NALOXONE HCL 0.4 MG/ML IJ SOLN
INTRAMUSCULAR | Status: AC
  Administered 2019-04-30: 18:00:00
  Filled 2019-04-30: qty 1

## 2019-04-30 MED ORDER — PRISMASOL BGK 4/2.5 32-4-2.5 MEQ/L IV SOLN
INTRAVENOUS | Status: DC
  Administered 2019-04-30 – 2019-05-01 (×5): via INTRAVENOUS_CENTRAL
  Filled 2019-04-30 (×12): qty 5000

## 2019-04-30 MED ORDER — LIDOCAINE HCL 1 % IJ SOLN
INTRAMUSCULAR | Status: DC | PRN
  Administered 2019-04-30: 19 mL

## 2019-04-30 MED ORDER — CALCIUM ACETATE (PHOS BINDER) 667 MG PO CAPS
667.0000 mg | ORAL_CAPSULE | Freq: Three times a day (TID) | ORAL | Status: DC
  Administered 2019-04-30: 667 mg via ORAL
  Filled 2019-04-30 (×3): qty 1

## 2019-04-30 MED ORDER — PANTOPRAZOLE SODIUM 40 MG PO TBEC
40.0000 mg | DELAYED_RELEASE_TABLET | Freq: Two times a day (BID) | ORAL | Status: DC
  Administered 2019-04-30 – 2019-05-05 (×11): 40 mg via ORAL
  Filled 2019-04-30 (×11): qty 1

## 2019-04-30 MED ORDER — PRISMASOL BGK 4/2.5 32-4-2.5 MEQ/L REPLACEMENT SOLN
Status: DC
  Administered 2019-04-30 – 2019-05-01 (×2): via INTRAVENOUS_CENTRAL
  Filled 2019-04-30 (×4): qty 5000

## 2019-04-30 MED ORDER — FENTANYL CITRATE (PF) 100 MCG/2ML IJ SOLN: 25.0000 ug | INTRAMUSCULAR | Status: DC | PRN

## 2019-04-30 MED ORDER — NALOXONE HCL 4 MG/10ML IJ SOLN
0.2500 mg/h | INTRAVENOUS | Status: DC
  Filled 2019-04-30: qty 10

## 2019-04-30 MED ORDER — ATORVASTATIN CALCIUM 40 MG PO TABS
40.0000 mg | ORAL_TABLET | Freq: Every day | ORAL | Status: DC
  Administered 2019-04-30 – 2019-05-05 (×6): 40 mg via ORAL
  Filled 2019-04-30 (×6): qty 1

## 2019-04-30 MED ORDER — HEPARIN (PORCINE) 25000 UT/250ML-% IV SOLN
1000.0000 [IU]/h | INTRAVENOUS | Status: DC
  Administered 2019-04-30: 1000 [IU]/h via INTRAVENOUS
  Filled 2019-04-30: qty 250

## 2019-04-30 MED ORDER — PROPOFOL 1000 MG/100ML IV EMUL
INTRAVENOUS | Status: AC
  Filled 2019-04-30: qty 100

## 2019-04-30 MED ORDER — ASPIRIN EC 81 MG PO TBEC: 81.0000 mg | DELAYED_RELEASE_TABLET | Freq: Every day | ORAL | Status: DC

## 2019-04-30 MED ORDER — FENTANYL CITRATE (PF) 250 MCG/5ML IJ SOLN
INTRAMUSCULAR | Status: AC
  Filled 2019-04-30: qty 5

## 2019-04-30 MED ORDER — SODIUM CHLORIDE 0.9 % IV SOLN
INTRAVENOUS | Status: DC | PRN
  Administered 2019-04-30: 500 mL

## 2019-04-30 MED ORDER — LIDOCAINE HCL (PF) 1 % IJ SOLN
INTRAMUSCULAR | Status: AC
  Filled 2019-04-30: qty 30

## 2019-04-30 MED ORDER — INSULIN ASPART 100 UNIT/ML ~~LOC~~ SOLN: 0.0000 [IU] | Freq: Every day | SUBCUTANEOUS | Status: DC

## 2019-04-30 MED ORDER — SODIUM CHLORIDE 0.9 % IV SOLN: INTRAVENOUS | Status: DC

## 2019-04-30 MED ORDER — GLYCOPYRROLATE PF 0.2 MG/ML IJ SOSY
PREFILLED_SYRINGE | INTRAMUSCULAR | Status: AC
  Filled 2019-04-30: qty 1

## 2019-04-30 MED ORDER — ALBUMIN HUMAN 25 % IV SOLN: 50.0000 g | Freq: Once | INTRAVENOUS | Status: DC

## 2019-04-30 MED ORDER — HEPARIN SODIUM (PORCINE) 1000 UNIT/ML IJ SOLN
INTRAMUSCULAR | Status: DC | PRN
  Administered 2019-04-30: 3000 [IU] via INTRAVENOUS

## 2019-04-30 MED ORDER — SODIUM CHLORIDE 0.9 % IV SOLN
INTRAVENOUS | Status: DC | PRN
  Administered 2019-04-30: 15:00:00 via INTRAVENOUS

## 2019-04-30 MED ORDER — DEXTROSE 50 % IV SOLN
1.0000 | Freq: Once | INTRAVENOUS | Status: AC
  Administered 2019-04-30: 50 mL via INTRAVENOUS
  Filled 2019-04-30: qty 50

## 2019-04-30 MED ORDER — HEPARIN SODIUM (PORCINE) 5000 UNIT/ML IJ SOLN
5000.0000 [IU] | Freq: Three times a day (TID) | INTRAMUSCULAR | Status: DC
  Administered 2019-04-30: 5000 [IU] via SUBCUTANEOUS
  Filled 2019-04-30: qty 1

## 2019-04-30 MED ORDER — HEPARIN SODIUM (PORCINE) 1000 UNIT/ML IJ SOLN
INTRAMUSCULAR | Status: AC
  Filled 2019-04-30: qty 1

## 2019-04-30 MED ORDER — HEPARIN BOLUS VIA INFUSION (CRRT): 1000.0000 [IU] | INTRAVENOUS | Status: DC | PRN

## 2019-04-30 MED ORDER — SODIUM CHLORIDE 0.9 % IV SOLN: 250.0000 mL | INTRAVENOUS | Status: DC | PRN

## 2019-04-30 MED ORDER — METOPROLOL TARTRATE 12.5 MG HALF TABLET
12.5000 mg | ORAL_TABLET | Freq: Two times a day (BID) | ORAL | Status: DC
  Administered 2019-04-30: 12.5 mg via ORAL
  Filled 2019-04-30: qty 1

## 2019-04-30 MED ORDER — ASPIRIN 81 MG PO CHEW
324.0000 mg | CHEWABLE_TABLET | Freq: Once | ORAL | Status: AC
  Administered 2019-04-30: 324 mg via ORAL
  Filled 2019-04-30: qty 4

## 2019-04-30 MED ORDER — INSULIN ASPART 100 UNIT/ML ~~LOC~~ SOLN: 0.0000 [IU] | Freq: Three times a day (TID) | SUBCUTANEOUS | Status: DC

## 2019-04-30 MED ORDER — DEXAMETHASONE SODIUM PHOSPHATE 10 MG/ML IJ SOLN
INTRAMUSCULAR | Status: AC
  Filled 2019-04-30: qty 1

## 2019-04-30 MED ORDER — ONDANSETRON HCL 4 MG/2ML IJ SOLN
INTRAMUSCULAR | Status: AC
  Filled 2019-04-30: qty 2

## 2019-04-30 MED ORDER — SODIUM CHLORIDE 0.9 % IV SOLN: 250.0000 [IU]/h | INTRAVENOUS | Status: DC

## 2019-04-30 MED ORDER — SODIUM CHLORIDE 0.9% FLUSH: 3.0000 mL | INTRAVENOUS | Status: DC | PRN

## 2019-04-30 MED ORDER — SODIUM CHLORIDE 0.9% FLUSH: 3.0000 mL | Freq: Two times a day (BID) | INTRAVENOUS | Status: DC

## 2019-04-30 MED ORDER — HEPARIN SODIUM (PORCINE) 1000 UNIT/ML DIALYSIS
1000.0000 [IU] | INTRAMUSCULAR | Status: DC | PRN
  Administered 2019-05-01: 19:00:00 3000 [IU] via INTRAVENOUS_CENTRAL
  Filled 2019-04-30 (×2): qty 6

## 2019-04-30 MED ORDER — PROPOFOL 10 MG/ML IV BOLUS
INTRAVENOUS | Status: DC | PRN
  Administered 2019-04-30 (×4): 10 mg via INTRAVENOUS

## 2019-04-30 MED ORDER — PERFLUTREN LIPID MICROSPHERE
1.0000 mL | INTRAVENOUS | Status: AC | PRN
  Administered 2019-04-30: 4 mL via INTRAVENOUS
  Filled 2019-04-30: qty 10

## 2019-04-30 MED ORDER — CEFAZOLIN SODIUM-DEXTROSE 2-3 GM-%(50ML) IV SOLR
INTRAVENOUS | Status: DC | PRN
  Administered 2019-04-30: 2 g via INTRAVENOUS

## 2019-04-30 MED ORDER — ONDANSETRON HCL 4 MG/2ML IJ SOLN
INTRAMUSCULAR | Status: DC | PRN
  Administered 2019-04-30: 4 mg via INTRAVENOUS

## 2019-04-30 MED ORDER — ATORVASTATIN CALCIUM 10 MG PO TABS: 20.0000 mg | ORAL_TABLET | Freq: Every day | ORAL | Status: DC

## 2019-04-30 MED ORDER — HEPARIN SODIUM (PORCINE) 1000 UNIT/ML IJ SOLN
INTRAMUSCULAR | Status: AC
  Filled 2019-04-30: qty 3

## 2019-04-30 MED ORDER — HEPARIN (PORCINE) 25000 UT/250ML-% IV SOLN
1000.0000 [IU]/h | INTRAVENOUS | Status: DC
  Administered 2019-04-30: 900 [IU]/h via INTRAVENOUS
  Filled 2019-04-30: qty 250

## 2019-04-30 MED ORDER — PROPOFOL 10 MG/ML IV BOLUS
INTRAVENOUS | Status: AC
  Filled 2019-04-30: qty 20

## 2019-04-30 MED ORDER — CEFAZOLIN SODIUM 1 G IJ SOLR
INTRAMUSCULAR | Status: AC
  Filled 2019-04-30: qty 20

## 2019-04-30 SURGICAL SUPPLY — 48 items
BAG BANDED W/RUBBER/TAPE 36X54 (MISCELLANEOUS) ×2 IMPLANT
BAG DECANTER FOR FLEXI CONT (MISCELLANEOUS) ×4 IMPLANT
BIOPATCH RED 1 DISK 7.0 (GAUZE/BANDAGES/DRESSINGS) ×3 IMPLANT
BIOPATCH RED 1IN DISK 7.0MM (GAUZE/BANDAGES/DRESSINGS) ×1
CATH PALINDROME RT-P 15FX19CM (CATHETERS) ×2 IMPLANT
CATH PALINDROME RT-P 15FX23CM (CATHETERS) IMPLANT
CATH PALINDROME RT-P 15FX28CM (CATHETERS) IMPLANT
CATH PALINDROME RT-P 15FX55CM (CATHETERS) IMPLANT
COUNTER NEEDLE 20 DBL MAG RED (NEEDLE) ×2 IMPLANT
COVER DOME SNAP 22 D (MISCELLANEOUS) ×2 IMPLANT
COVER PROBE W GEL 5X96 (DRAPES) ×4 IMPLANT
COVER SURGICAL LIGHT HANDLE (MISCELLANEOUS) ×2 IMPLANT
COVER WAND RF STERILE (DRAPES) ×4 IMPLANT
DECANTER SPIKE VIAL GLASS SM (MISCELLANEOUS) ×4 IMPLANT
DERMABOND ADVANCED (GAUZE/BANDAGES/DRESSINGS) ×2
DERMABOND ADVANCED .7 DNX12 (GAUZE/BANDAGES/DRESSINGS) ×2 IMPLANT
DRAPE C-ARM 42X72 X-RAY (DRAPES) ×2 IMPLANT
DRAPE CHEST BREAST 15X10 FENES (DRAPES) ×4 IMPLANT
DRSG COVADERM 4X6 (GAUZE/BANDAGES/DRESSINGS) ×2 IMPLANT
GAUZE 4X4 16PLY RFD (DISPOSABLE) ×4 IMPLANT
GLOVE BIO SURGEON STRL SZ7.5 (GLOVE) ×2 IMPLANT
GLOVE SS BIOGEL STRL SZ 7.5 (GLOVE) ×2 IMPLANT
GLOVE SUPERSENSE BIOGEL SZ 7.5 (GLOVE) ×2
GOWN STRL REUS W/ TWL LRG LVL3 (GOWN DISPOSABLE) ×4 IMPLANT
GOWN STRL REUS W/TWL LRG LVL3 (GOWN DISPOSABLE) ×4
KIT BASIN OR (CUSTOM PROCEDURE TRAY) ×4 IMPLANT
KIT TURNOVER KIT B (KITS) ×4 IMPLANT
NDL 18GX1X1/2 (RX/OR ONLY) (NEEDLE) ×2 IMPLANT
NDL HYPO 25GX1X1/2 BEV (NEEDLE) ×2 IMPLANT
NEEDLE 18GX1X1/2 (RX/OR ONLY) (NEEDLE) ×4 IMPLANT
NEEDLE 22X1 1/2 (OR ONLY) (NEEDLE) ×2 IMPLANT
NEEDLE 25GAX1.5 (MISCELLANEOUS) ×2 IMPLANT
NEEDLE HYPO 25GX1X1/2 BEV (NEEDLE) ×4 IMPLANT
NS IRRIG 1000ML POUR BTL (IV SOLUTION) ×4 IMPLANT
PACK GENERAL/GYN (CUSTOM PROCEDURE TRAY) ×2 IMPLANT
PACK SURGICAL SETUP 50X90 (CUSTOM PROCEDURE TRAY) ×2 IMPLANT
PAD ARMBOARD 7.5X6 YLW CONV (MISCELLANEOUS) ×8 IMPLANT
SOAP 2 % CHG 4 OZ (WOUND CARE) ×4 IMPLANT
SUT ETHILON 3 0 PS 1 (SUTURE) ×4 IMPLANT
SUT MNCRL AB 4-0 PS2 18 (SUTURE) ×2 IMPLANT
SUT VICRYL 4-0 PS2 18IN ABS (SUTURE) ×4 IMPLANT
SYR 10ML LL (SYRINGE) ×4 IMPLANT
SYR 20ML LL LF (SYRINGE) ×6 IMPLANT
SYR 5ML LL (SYRINGE) ×8 IMPLANT
SYR CONTROL 10ML LL (SYRINGE) ×4 IMPLANT
TOWEL GREEN STERILE (TOWEL DISPOSABLE) ×8 IMPLANT
TOWEL GREEN STERILE FF (TOWEL DISPOSABLE) ×4 IMPLANT
WATER STERILE IRR 1000ML POUR (IV SOLUTION) ×4 IMPLANT

## 2019-04-30 NOTE — Op Note (Signed)
OPERATIVE NOTE  PROCEDURE: 1. Right internal jugular vein cannulation under ultrasound guidance 1. Right internal jugular vein tunneled dialysis catheter placement (19 cm Palindrome catheter)  PRE-OPERATIVE DIAGNOSIS: end-stage renal failure  POST-OPERATIVE DIAGNOSIS: same as above  SURGEON: Marty Heck, MD  ANESTHESIA: MAC  ESTIMATED BLOOD LOSS: 30 cc  FINDING(S): 1.  Tips of the catheter in the right atrium on fluoroscopy 2.  No obvious pneumothorax on fluoroscopy  SPECIMEN(S):  none  INDICATIONS:   Blake [name] is a 73 y.o. male who presents with end stage renal disease.  He previously underwent revision of a right brachiocephalic AV fistula last week with Dr. Trula Slade.  He presented to the ED today with evidence of an STEMI and also needed emergent dialysis.  They were unable to use his fistula.  He presents for urgent dialysis catheter placement.  The patient is aware the risks of tunneled dialysis catheter placement include but are not limited to: bleeding, infection, central venous injury, pneumothorax, possible venous stenosis, possible malpositioning in the venous system, and possible infections related to long-term catheter presence.  The patient was aware of these risks and agreed to proceed.  DESCRIPTION: After written full informed consent was obtained from the patient, the patient was taken back to the operating room.  Prior to induction, the patient was given IV antibiotics.  After obtaining adequate sedation, the patient was prepped and draped in the standard fashion for a chest or neck tunneled dialysis catheter placement.   The cannulation site, the catheter exit site, and tract for the subcutaneous tunnel were then anesthestized with a total of 19 cc of 1% lidocaine without epinephrine.  Under ultrasound guidance, the right internal jugular vein was cannulated with the 18 gauge needle.  A J-wire was then placed down into the inferior vena cava under  fluoroscopic guidance.  The wire was then secured in place with a clamp to the drapes.  I then made stab incisions at the neck and exit sites.   I dissected from the exit site to the cannulation site with a tunneler.    The back end of this catheter was transected, and docked onto the tunneler.  The distal catheter was delivered through the subcutaneous tunnel and transected a second time and clamped.  The wire was then unclamped and I removed the needle.  The skin tract and venotomy was dilated serially with dilators.  Finally, the dilator-sheath was placed under fluoroscopic guidance into the superior vena cava.  The dilator and wire were removed.  A 19 cm Diatek catheter was placed under fluoroscopic guidance down into the right atrium.  The sheath was broken and peeled away while holding the catheter cuff at the level of the skin. The ports were docked onto the two lumens of the catheter.  The catheter collar was then snapped into place.  Each port was tested by aspirating and flushing.  No resistance was noted.  Each port was then thoroughly flushed with heparinized saline.  The catheter was secured in placed with two interrupted stitches of 3-0 Nylon tied to the catheter.  The neck incision was closed with a U-stitch of 4-0 Monocryl.  The neck and chest incision were cleaned and sterile bandages applied.  Each port was then loaded with concentrated heparin (1000 Units/mL) at the manufacturer recommended volumes to each port.  Sterile caps were applied to each port.    On completion fluoroscopy, the tips of the catheter were in the right atrium, and there  was no evidence of pneumothorax.   COMPLICATIONS: None  CONDITION: Stable  Marty Heck, MD [facility] Office: [phone] Pager: [phone]   04/30/2019, 3:45 PM

## 2019-04-30 NOTE — Progress Notes (Signed)
Pt brought to 4E21 for continued care from PACU. At time, pt had been well appearing with stable vitals, tolerating POs, A/OX4, and talkative for the 2 hrs he remined in PACUs care. Pt was advised to "scoot" from stretcher to bed once in the room. Pt made half transition before becoming very dyspneic to the point to being unable to talk. Oxygen was placed 2 L [place], still no improvement. O2 increased to 4L, rapid called, floor nurse overseeing care in room as well. Pt placed on monitors, SR 60s, BP 64/48, O2 91 on 4L [place]. Pt became pale, diaphoretic, agonal breathing, unresponsive, and fixed gaze to right side with mouth puckered and arms postured. Code was called and medical team to room. Pt kept pulse, BP eventually returned to baseline, and pt responsive yet fatigued after 30 minutes of the episode.

## 2019-04-30 NOTE — Transfer of Care (Signed)
Immediate Anesthesia Transfer of Care Note  Patient: [name]  Procedure(s) Performed: INSERTION OF DIALYSIS CATHETER (N/A ) Ultrasound Guidance For Vascular Access  Patient Location: PACU  Anesthesia Type:MAC  Level of Consciousness: awake, alert  and oriented  Airway & Oxygen Therapy: Patient Spontanous Breathing and Patient connected to nasal cannula oxygen  Post-op Assessment: Report given to RN and Post -op Vital signs reviewed and stable  Post vital signs: Reviewed and stable  Last Vitals:  Vitals Value Taken Time  BP 161/75 04/30/19 1549  Temp    Pulse 67 04/30/19 1554  Resp 13 04/30/19 1554  SpO2 97 % 04/30/19 1554  Vitals shown include unvalidated device data.  Last Pain:  Vitals:   04/30/19 0630  TempSrc:   PainSc: 0-No pain         Complications: No apparent anesthesia complications

## 2019-04-30 NOTE — Anesthesia Postprocedure Evaluation (Signed)
Anesthesia Post Note  Patient: Blake [name]  Procedure(s) Performed: INSERTION OF DIALYSIS CATHETER (N/A ) Ultrasound Guidance For Vascular Access     Patient location during evaluation: PACU Anesthesia Type: MAC Level of consciousness: awake Pain management: pain level controlled Respiratory status: spontaneous breathing Cardiovascular status: stable Postop Assessment: no apparent nausea or vomiting Anesthetic complications: no    Last Vitals:  Vitals:   04/30/19 1720 04/30/19 1800  BP: 128/76 (!) 142/74  Pulse: 63 77  Resp: 16 (!) 21  Temp: 36.8 C   SpO2: 100% 99%    Last Pain:  Vitals:   04/30/19 1720  TempSrc:   PainSc: Asleep                 Sabiha Sura

## 2019-04-30 NOTE — ED Notes (Signed)
New orders for NPO at this time.  Pt updated.

## 2019-04-30 NOTE — ED Notes (Signed)
Pt room assignment removed at this time.

## 2019-04-30 NOTE — Progress Notes (Signed)
Chart reviewed.  Patient with syncopal episode with SBP 60s, had code blue earlier. He has acute MI.  Plan to start CRRT in ICU because of hemodynamic instability.  RIJ TDC placed by [facility] today. Already on IV heparin. D/w primary team, Dr. Lupita Leash.

## 2019-04-30 NOTE — ED Notes (Signed)
Heparin continues  At 1000units per hour

## 2019-04-30 NOTE — Progress Notes (Signed)
Progress Note  Patient Name: Blake [name] Date of Encounter: 04/30/2019  Primary Cardiologist: New; Dr Stanford Breed  Subjective   Mild dyspnea; no chest pain  Inpatient Medications    Scheduled Meds: . [START ON 05/01/2019] aspirin EC  81 mg Oral Daily  . atorvastatin  40 mg Oral QHS  . calcium acetate  667 mg Oral TID WC  . insulin aspart  0-5 Units Subcutaneous QHS  . insulin aspart  0-9 Units Subcutaneous TID WC  . multivitamin  1 tablet Oral QHS  . pantoprazole  40 mg Oral BID   Continuous Infusions: . heparin 1,000 Units/hr (04/30/19 0420)   PRN Meds: perflutren lipid microspheres (DEFINITY) IV suspension   Vital Signs    Vitals:   04/30/19 0915 04/30/19 0930 04/30/19 0945 04/30/19 1000  BP: (!) 165/77 (!) 176/82 (!) 162/75 (!) 157/75  Pulse: 71 72 70   Resp: 19 15 18 15   Temp:      TempSrc:      SpO2: 100% 100% 100%   Weight:      Height:       No intake or output data in the 24 hours ending 04/30/19 1021 Last 3 Weights 04/29/2019 04/24/2019 12/22/2018  Weight (lbs) 190 lb 0.6 oz 188 lb 15 oz 204 lb  Weight (kg) 86.2 kg 85.7 kg 92.534 kg      ECG    Sinus with no ST changes - Personally Reviewed  Physical Exam   GEN: No acute distress.   Neck: No JVD Cardiac: RRR, no murmurs, rubs, or gallops.  Respiratory: Basilar crackles GI: Soft, nontender, non-distended  MS: No edema; AV fistula RUE Neuro:  Nonfocal  Psych: Normal affect   Labs    High Sensitivity Troponin:   Recent Labs  Lab 04/29/19 1938 04/29/19 2220 04/30/19 0233  TROPONINIHS 169* 4,723* 18,435*       Chemistry Recent Labs  Lab 04/29/19 1938 04/29/19 1950 04/30/19 0233  NA 143 143 144  K 5.7* 6.0* 5.6*  CL 103 110 107  CO2 15*  --  19*  GLUCOSE 211* 196* 125*  BUN 91* 89* 93*  CREATININE 15.20* 15.30* 15.37*  CALCIUM 8.5*  --  8.5*  PROT 7.1  --   --   ALBUMIN 3.1*  --  3.0*  AST 443*  --   --   ALT 147*  --   --   ALKPHOS 168*  --   --   BILITOT 0.4  --   --    GFRNONAA 3*  --  3*  GFRAA 3*  --  3*  ANIONGAP 25*  --  18*     Hematology Recent Labs  Lab 04/29/19 1938 04/29/19 1950 04/30/19 0233  WBC 7.7  --  8.6  RBC 4.00*  --  3.81*  HGB 11.4* 12.6* 10.9*  HCT 37.5* 37.0* 34.8*  MCV 93.8  --  91.3  MCH 28.5  --  28.6  MCHC 30.4  --  31.3  RDW 14.6  --  14.7  PLT 180  --  180    Radiology    Ct Head Wo Contrast  Result Date: 04/29/2019 CLINICAL DATA:  Seizure, new, nontraumatic, >40 yrs EXAM: CT HEAD WITHOUT CONTRAST TECHNIQUE: Contiguous axial images were obtained from the base of the skull through the vertex without intravenous contrast. COMPARISON:  Head CT 02/08/2018 FINDINGS: Brain: No intracranial hemorrhage, mass effect, or midline shift. Generalized atrophy. Unchanged chronic small vessel ischemia, moderate for age. No hydrocephalus.  The basilar cisterns are patent. No evidence of territorial infarct or acute ischemia. No extra-axial or intracranial fluid collection. Vascular: Atherosclerosis of skullbase vasculature without hyperdense vessel or abnormal calcification. Skull: No fracture or focal lesion. Sinuses/Orbits: Paranasal sinuses and mastoid air cells are clear. The visualized orbits are unremarkable. Left cataract resection. Other: None. IMPRESSION: 1. No acute intracranial abnormality. 2. Unchanged atrophy and chronic small vessel ischemia. Electronically Signed   By: Keith Rake M.D.   On: 04/29/2019 22:33   Ct Angio Chest Pe W And/or Wo Contrast  Result Date: 04/30/2019 CLINICAL DATA:  PE suspected, high pretest prob Patient found unresponsive in bathroom, missed last 3 dialysis appointments. EXAM: CT ANGIOGRAPHY CHEST WITH CONTRAST TECHNIQUE: Multidetector CT imaging of the chest was performed using the standard protocol during bolus administration of intravenous contrast. Multiplanar CT image reconstructions and MIPs were obtained to evaluate the vascular anatomy. CONTRAST:  28mL OMNIPAQUE IOHEXOL 350 MG/ML SOLN  COMPARISON:  Radiograph yesterday. No prior chest CT. Lung bases from abdominal CT 02/08/2018 FINDINGS: Cardiovascular: There are no filling defects within the pulmonary arteries to suggest pulmonary embolus. Aortic atherosclerosis without aneurysm or dissection. Coronary artery calcifications. Mild cardiomegaly. No pericardial effusion. Mediastinum/Nodes: Enlarged lower paratracheal node at 14 mm, series 5, image 36. Enlarged right hilar node measuring 2.6 cm, image 50. Multiple additional mildly enlarged and prominent bilateral hilar and mediastinal nodes. Decompressed esophagus. Small hiatal hernia. High-density material in the distal esophagus and proximal stomach. No visualized thyroid nodule. Lungs/Pleura: Fine reticular and ground-glass opacities present in both lungs, right greater than left and more prominent at the lung bases. Similar reticular opacities seen on prior abdominal CT, however currently increased from that exam. Clustered calcified pulmonary nodules in the right lung. Mild irregular fissural thickening. No septal thickening or pleural fluid. Central bronchial thickening, trachea and mainstem bronchi are patent. Upper Abdomen: Small hiatal hernia. No acute findings. Musculoskeletal: There are no acute or suspicious osseous abnormalities. Review of the MIP images confirms the above findings. IMPRESSION: 1. No pulmonary embolus. 2. Fine reticular and ground-glass opacities in both lungs, right greater than left and more prominent at the lung bases. Similar reticular opacities seen on 2019 abdominal CT, however currently increased from that exam. Suspect pulmonary edema or acute infection superimposed on chronic interstitial lung disease. Consider nonemergent high resolution chest CT for characterization. 3. Mild mediastinal and right hilar adenopathy is likely reactive, but nonspecific. 4. Aortic Atherosclerosis (ICD10-I70.0). Coronary artery calcifications. 5. Small hiatal hernia. Electronically  Signed   By: Keith Rake M.D.   On: 04/30/2019 00:25   Dg Chest Portable 1 View  Result Date: 04/29/2019 CLINICAL DATA:  Dyspnea. Syncope. Found unresponsive in bathroom. Missed dialysis. EXAM: PORTABLE CHEST 1 VIEW COMPARISON:  Radiograph 12/10/2018 FINDINGS: Cardiomegaly is unchanged. Unchanged mediastinal contours. Fine reticular opacities throughout both lungs which are more prominent than on prior exam. Small right and possibly left pleural effusions. No confluent airspace disease. No pneumothorax. No acute osseous abnormalities. IMPRESSION: Cardiomegaly with pulmonary edema and small right and possibly left pleural effusions. Electronically Signed   By: Keith Rake M.D.   On: 04/29/2019 20:25    Patient Profile     73 y.o. male with past medical history of diabetes mellitus, hypertension, hyperlipidemia, end-stage renal disease dialysis dependent with non-ST elevation myocardial infarction and syncope.  Assessment & Plan    1 non-ST elevation myocardial infarction-patient has ruled in.  He is not having chest pain and electrocardiogram shows no acute ST changes.  We  will treat with aspirin, heparin, statin and add metoprolol 12.5 mg twice daily.  He will ultimately require cardiac catheterization.  However he has not been dialyzed in approximately 1 week and is volume overloaded on examination.  He also has hyperkalemia.  I would like for dialysis to be performed today and we will plan catheterization tomorrow or sooner if necessary.  2 syncope-occurred in the setting of having a bowel movement.  Description sounds like defecation syncope.  Follow telemetry.  Enzymes are abnormal and will plan catheterization as outlined above.  Await echocardiogram results.  3 end-stage renal disease-has not been dialyzed in 1 week.  Will contact nephrology as patient does have hyperkalemia and volume excess.  4 diabetes mellitus-Per primary care.  For questions or updates, please contact [place] Please consult [email or web address] for contact info under        Signed, Kirk Ruths, MD  04/30/2019, 10:21 AM

## 2019-04-30 NOTE — Consult Note (Signed)
Cardiology Consult    Patient ID: Blake [name] MRN: [id number], DOB/AGE: [birth year]   Admit date: 04/29/2019 Date of Consult: 04/30/2019  Primary Physician: [name], MD Primary Cardiologist: No primary care provider on file. Requesting Provider: Dana Allan, MD  Patient Profile    Blake [name] is a 73 y.o. male with a history of ESRD on HD, type 2 DM, HTN, dyslipidemia, and recent partial resection and plication of ulcerated, aneurysmal right brachiocephalic fistula. He is being seen today for the evaluation of ECG changes and troponin elevation in the setting of syncope.   History of Present Illness    He recalls walking to the bathroom and sitting down, then waking up on the floor. He did not have any symptoms preceding the event, but he does report feeling short of breath after for quite a while after waking up. Apparently family called EMS after finding him unresponsive who placed him on oxygen and gradually his mentation improved.   He endorses significant exertional dyspnea over the recent months to the point that he has difficulty carrying a bag of groceries from his car to his house. He denies ever having any chest pain however. He also denies any other syncopal events like this in the past. He has missed 2 or 3 dialysis sessions recently due to dysfunction of his fistula per his report. He states that there have been access issues due to it clotting since the surgery.   ECG in the ED showed new RBBB with right axis and anterior ST depression. Initial labs significant for troponin 169, AST/ALT 443/147, iCa 1.05, K 5.7, bicarb 15, Cr 15.2, BUN 91, glucose 211. He was given calcium 1g IV. Repeat ECGs showed resolution of RBBB and eventual resolution of ST depression. CTA was negative for PE and CT head negative for ICH. Repeat troponin was 4723. BP has been only mildly elevated. Mental status improved rapidly and seems to be back to baseline. He is planned  for dialysis in the morning.   Past Medical History   Past Medical History:  Diagnosis Date  . Acute osteomyelitis of toe of right foot ([place]) 08/02/2015  . Arthritis    "maybe RUE/hand" (02/08/2018)  . ESRD (end stage renal disease) on dialysis [facility])    "TTS; [place]" (02/08/2018)  . Hyperlipidemia   . Hypertension   . Type II diabetes mellitus ([place])     Past Surgical History:  Procedure Laterality Date  . AMPUTATION TOE Right 08/04/2015   Procedure: RIGHT GREAT TOE AMPUTATION;  Surgeon: [name], MD;  Location: [place];  Service: Orthopedics;  Laterality: Right;  . AV FISTULA PLACEMENT Right 07/05/2014   Procedure: ARTERIOVENOUS BRACHIALCEPHALIC (AV) FISTULA CREATION;  Surgeon: Conrad Excelsior, MD;  Location: [place];  Service: Vascular;  Laterality: Right;  . AV FISTULA PLACEMENT Right 04/24/2019   Procedure: REVISION RIGHT ARM ARTERIOVENOUS (AV) FISTULA;  Surgeon: Serafina Mitchell, MD;  Location: [place];  Service: Vascular;  Laterality: Right;  . BACK SURGERY    . ESOPHAGOGASTRODUODENOSCOPY (EGD) WITH PROPOFOL N/A 11/28/2015   Procedure: ESOPHAGOGASTRODUODENOSCOPY (EGD) WITH PROPOFOL;  Surgeon: [name], MD;  Location: [facility];  Service: Endoscopy;  Laterality: N/A;  . INSERTION OF DIALYSIS CATHETER Left 07/05/2014   Procedure: INSERTION OF DIALYSIS CATHETER-LEFT INTERNAL JUGULAR PLACEMENT;  Surgeon: Conrad Lakeshire, MD;  Location: [place];  Service: Vascular;  Laterality: Left;  . LUMBAR DISC SURGERY    . REMOVAL OF A DIALYSIS CATHETER Right 07/05/2014  Procedure: REMOVAL OF A DIALYSIS CATHETER;  Surgeon: Conrad [place], MD;  Location: [place];  Service: Vascular;  Laterality: Right;    No Known Allergies   Inpatient Medications    . aspirin EC  81 mg Oral Daily  . atorvastatin  20 mg Oral QHS  . calcium acetate  667 mg Oral TID WC  . heparin  5,000 Units Subcutaneous Q8H  . insulin aspart  0-5 Units Subcutaneous QHS  . insulin aspart  0-9 Units Subcutaneous TID WC  .  multivitamin  1 tablet Oral QHS  . pantoprazole  40 mg Oral BID  . sodium bicarbonate  1,300 mg Oral BID  . sodium zirconium cyclosilicate  10 g Oral [id number]    Family History    Family History  Problem Relation Age of Onset  . Diabetes Father   . Heart attack Father    He indicated that his mother is deceased. He indicated that his father is deceased.   Social History    Social History   Socioeconomic History  . Marital status: Single    Spouse name: Not on file  . Number of children: Not on file  . Years of education: Not on file  . Highest education level: Not on file  Occupational History  . Not on file  Social Needs  . Financial resource strain: Not on file  . Food insecurity    Worry: Not on file    Inability: Not on file  . Transportation needs    Medical: Not on file    Non-medical: Not on file  Tobacco Use  . Smoking status: Former Smoker    Packs/day: 0.50    Years: 5.00    Pack years: 2.50    Types: Cigarettes    Quit date: 08/24/1979    Years since quitting: 39.7  . Smokeless tobacco: Never Used  Substance and Sexual Activity  . Alcohol use: Not Currently    Alcohol/week: 0.0 standard drinks    Comment: 02/08/2018 "stopped in my 50's"  . Drug use: Never  . Sexual activity: Not Currently  Lifestyle  . Physical activity    Days per week: Not on file    Minutes per session: Not on file  . Stress: Not on file  Relationships  . Social Herbalist on phone: Not on file    Gets together: Not on file    Attends religious service: Not on file    Active member of club or organization: Not on file    Attends meetings of clubs or organizations: Not on file    Relationship status: Not on file  . Intimate partner violence    Fear of current or ex partner: Not on file    Emotionally abused: Not on file    Physically abused: Not on file    Forced sexual activity: Not on file  Other Topics Concern  . Not on file  Social History Narrative  . Not  on file     Review of Systems    General:  No chills, fever, night sweats or weight changes.  Cardiovascular:  See HPI Dermatological: No rash, lesions/masses Respiratory: +cough and dyspnea Urologic: No hematuria, dysuria Abdominal:   +nausea and vomiting (resolved). No diarrhea, hematochezia, melena, or hematemesis Neurologic:  Reported right gaze preference, encephalopathy that has resolved.  All other systems reviewed and are otherwise negative except as noted above.  Physical Exam    Blood pressure 130/76, pulse 72, temperature 98.6  F (37 C), temperature source Oral, resp. rate 18, height 5\' 8"  (1.727 m), weight 86.2 kg, SpO2 100 %.    No intake or output data in the 24 hours ending 04/30/19 0240 Wt Readings from Last 3 Encounters:  04/29/19 86.2 kg  04/24/19 85.7 kg  12/22/18 92.5 kg    CONSTITUTIONAL: alert and conversant, in no distress HEENT: conjunctiva normal, EOM intact, pupils equal. CARDIOVASCULAR: Regular rhythm. No gallop, murmur, or rub. Normal S1/S2. +JVD. Radial pulses 1+.  PULMONARY/CHEST WALL: no deformities, trace crackles, normal work of breathing ABDOMINAL: soft, non-tender, non-distended EXTREMITIES: RUE fistula without drainage. Right toe amputated. No edema, warm and well-perfused NEUROLOGIC: alert, no abnormal movements, cranial nerves grossly intact.   Labs    HS Troponin: 169 -> 4723  Lab Results  Component Value Date   WBC 7.7 04/29/2019   HGB 12.6 (L) 04/29/2019   HCT 37.0 (L) 04/29/2019   MCV 93.8 04/29/2019   PLT 180 04/29/2019    Recent Labs  Lab 04/29/19 1938 04/29/19 1950  NA 143 143  K 5.7* 6.0*  CL 103 110  CO2 15*  --   BUN 91* 89*  CREATININE 15.20* 15.30*  CALCIUM 8.5*  --   PROT 7.1  --   BILITOT 0.4  --   ALKPHOS 168*  --   ALT 147*  --   AST 443*  --   GLUCOSE 211* 196*   Lab Results  Component Value Date   CHOL 106 07/03/2014   HDL 32 (L) 07/03/2014   LDLCALC 49 07/03/2014   TRIG 126 07/03/2014   No  results found for: [facility]   Radiology Studies    Ct Head Wo Contrast  Result Date: 04/29/2019 CLINICAL DATA:  Seizure, new, nontraumatic, >41 yrs EXAM: CT HEAD WITHOUT CONTRAST TECHNIQUE: Contiguous axial images were obtained from the base of the skull through the vertex without intravenous contrast. COMPARISON:  Head CT 02/08/2018 FINDINGS: Brain: No intracranial hemorrhage, mass effect, or midline shift. Generalized atrophy. Unchanged chronic small vessel ischemia, moderate for age. No hydrocephalus. The basilar cisterns are patent. No evidence of territorial infarct or acute ischemia. No extra-axial or intracranial fluid collection. Vascular: Atherosclerosis of skullbase vasculature without hyperdense vessel or abnormal calcification. Skull: No fracture or focal lesion. Sinuses/Orbits: Paranasal sinuses and mastoid air cells are clear. The visualized orbits are unremarkable. Left cataract resection. Other: None. IMPRESSION: 1. No acute intracranial abnormality. 2. Unchanged atrophy and chronic small vessel ischemia. Electronically Signed   By: Keith Rake M.D.   On: 04/29/2019 22:33   Ct Angio Chest Pe W And/or Wo Contrast  Result Date: 04/30/2019 CLINICAL DATA:  PE suspected, high pretest prob Patient found unresponsive in bathroom, missed last 3 dialysis appointments. EXAM: CT ANGIOGRAPHY CHEST WITH CONTRAST TECHNIQUE: Multidetector CT imaging of the chest was performed using the standard protocol during bolus administration of intravenous contrast. Multiplanar CT image reconstructions and MIPs were obtained to evaluate the vascular anatomy. CONTRAST:  34mL OMNIPAQUE IOHEXOL 350 MG/ML SOLN COMPARISON:  Radiograph yesterday. No prior chest CT. Lung bases from abdominal CT 02/08/2018 FINDINGS: Cardiovascular: There are no filling defects within the pulmonary arteries to suggest pulmonary embolus. Aortic atherosclerosis without aneurysm or dissection. Coronary artery calcifications. Mild  cardiomegaly. No pericardial effusion. Mediastinum/Nodes: Enlarged lower paratracheal node at 14 mm, series 5, image 36. Enlarged right hilar node measuring 2.6 cm, image 50. Multiple additional mildly enlarged and prominent bilateral hilar and mediastinal nodes. Decompressed esophagus. Small hiatal hernia. High-density material in the distal  esophagus and proximal stomach. No visualized thyroid nodule. Lungs/Pleura: Fine reticular and ground-glass opacities present in both lungs, right greater than left and more prominent at the lung bases. Similar reticular opacities seen on prior abdominal CT, however currently increased from that exam. Clustered calcified pulmonary nodules in the right lung. Mild irregular fissural thickening. No septal thickening or pleural fluid. Central bronchial thickening, trachea and mainstem bronchi are patent. Upper Abdomen: Small hiatal hernia. No acute findings. Musculoskeletal: There are no acute or suspicious osseous abnormalities. Review of the MIP images confirms the above findings. IMPRESSION: 1. No pulmonary embolus. 2. Fine reticular and ground-glass opacities in both lungs, right greater than left and more prominent at the lung bases. Similar reticular opacities seen on 2019 abdominal CT, however currently increased from that exam. Suspect pulmonary edema or acute infection superimposed on chronic interstitial lung disease. Consider nonemergent high resolution chest CT for characterization. 3. Mild mediastinal and right hilar adenopathy is likely reactive, but nonspecific. 4. Aortic Atherosclerosis (ICD10-I70.0). Coronary artery calcifications. 5. Small hiatal hernia. Electronically Signed   By: Keith Rake M.D.   On: 04/30/2019 00:25   Dg Chest Portable 1 View  Result Date: 04/29/2019 CLINICAL DATA:  Dyspnea. Syncope. Found unresponsive in bathroom. Missed dialysis. EXAM: PORTABLE CHEST 1 VIEW COMPARISON:  Radiograph 12/10/2018 FINDINGS: Cardiomegaly is unchanged.  Unchanged mediastinal contours. Fine reticular opacities throughout both lungs which are more prominent than on prior exam. Small right and possibly left pleural effusions. No confluent airspace disease. No pneumothorax. No acute osseous abnormalities. IMPRESSION: Cardiomegaly with pulmonary edema and small right and possibly left pleural effusions. Electronically Signed   By: Keith Rake M.D.   On: 04/29/2019 20:25    ECG & Cardiac Imaging    ECG 1: NSR, new RBBB with right axis and anterior ST depression - personally reviewed. ECG 2: NSR, RBBB resolved, interval improvement in ST depression ECG 3: NSR, ST depression resolved  Assessment & Plan    Blake [name] is a 73 y.o. male with a history of ESRD on HD, type 2 DM, HTN, dyslipidemia, and recent partial resection and plication of ulcerated, aneurysmal right brachiocephalic fistula. He presented with syncope of unclear etiology and found to have transient RBBB and anterior ST depression associated with rising troponin up to 4700. No chest pain. Ruled out for PE. Suspect dyspnea may be more related to volume that coronary disease, but hard to say for sure. Priority for now should be dialysis. Will treat empirically with heparin in the interim given the degree of his troponin elevation, ECG changes, and risk factors, despite somewhat low suspicion for true ACS given his presentation.   - Continue to trend troponin every 4 hours - TTE in the morning - Start heparin infusion with bolus - Full dose aspirin then 81mg  daily - Increase atorvastatin to 40mg  - Will defer beta blocker to primary team and nephrology given prior discontinuation of antihypertensives for orthostatic hypotension.  - Agree with dialysis today as the priority - Will consider ischemic evaluation based on clinical course   Signed, Marykay Lex, MD 04/30/2019, 2:40 AM  For questions or updates, please contact   Please consult [email or web address] for contact info under  Cardiology/STEMI.

## 2019-04-30 NOTE — Progress Notes (Signed)
Inpatient Diabetes Program Recommendations  AACE/ADA: New Consensus Statement on Inpatient Glycemic Control (2015)  Target Ranges:  Prepandial:   less than 140 mg/dL      Peak postprandial:   less than 180 mg/dL (1-2 hours)      Critically ill patients:  140 - 180 mg/dL   Lab Results  Component Value Date   GLUCAP 67 (L) 04/30/2019   HGBA1C 6.5 (H) 12/09/2018    Review of Glycemic Control  Diabetes history: DM2 Outpatient Diabetes medications: Novolin R 0-9 units tidwc Current orders for Inpatient glycemic control: Novolog 0-9 units tidwc and hs  Needs updated HgbA1C Mild hypo this am of 67 mg/dL. Has received no insulin since hospitalization.   Inpatient Diabetes Program Recommendations:     HgbA1C to assess glycemic control prior to admission.  Will continue to follow.  Thank you. Lorenda Peck, RD, LDN, CDE Inpatient Diabetes Coordinator [phone]

## 2019-04-30 NOTE — ED Notes (Signed)
Paged [facility] to rn Chris--Sherilee Smotherman

## 2019-04-30 NOTE — Progress Notes (Signed)
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 In the ER, found to have positive troponin and abnormal EKG nephrology and cardiology consulted and was admitted. CT head has not shown any acute findings.  Chest x-ray revealed cardiomegaly with pulmonary edema and small right and possible left pleural effusions.  Subjective: Denies cp, sob, fever, chills. Has not eaten since yesterday -sugar borderline Felt like he needed oxygen this am but sat was okay.  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  Syncopal episode: Unclear etiology, likely multifactorial-has missed hemodialysis couple of times.  CT head unremarkable, follow-up cardiology recommendation/possible ischemic evaluation. He reports he passed out for few seconds. No chest pain, focal weakness or seizure like episodes or any body in jury. Monitor orthostatics.  Follow-up echocardiogram.  Elevated troponin/NSTEMI: He denies any chest pain since yesterday.  Troponin has increased significantly. Appreciate cardiology input continue full dose heparin infusion, aspirin 81, Lipitor 40, metoprolol. Further plan per cardiology with  cardiac cath tomorrow or sooner if needed.  F/u echo\  ESRD on HD TTHS- last HD on Tuesday. Missed 2 prior HD, nephrology on consult for dialysis.  Discharge nephrology planning for permacath access and dialysis today then TTS schedule.  Fluid overload/pulmonary edema A cute from missed dialysis planning for dialysis after line access today.  Keeping n.p.o.  HyperKalemia status post temporizing measure, potassium down to 5.6 from 6.0.  HD per nephrology.    Type 2 diabetes mellitus: Blood sugar controlled, Sugar borderline at 60s this am. Allow po. continue sliding scale insulin.  Hypertension: Blood pressure controlled not on antihypertensive due to previous orthostatic hypotension  Dyslipidemia: on Lipitor  Body mass index is 28.89 kg/m.   DVT prophylaxis:Heparin Code Status: full Family Communication: plan of care discussed with patient in detail.   Disposition Plan: Remains inpatient pending clinical improvement.   Consultants:  Nephrology  Cardiology  Procedures: HD today  CTA  1. No pulmonary embolus. 2. Fine reticular and ground-glass opacities in both lungs, right greater than left and more prominent at the lung bases. Similar reticular opacities seen on 2019 abdominal CT, however currently increased from that exam. Suspect pulmonary edema or acute infection superimposed on chronic interstitial lung disease. Consider nonemergent high resolution chest CT for characterization. 3. Mild mediastinal and right hilar adenopathy is likely reactive, but nonspecific. 4. Aortic Atherosclerosis (ICD10-I70.0). Coronary artery calcifications. 5. Small hiatal hernia. Microbiology:  Antimicrobials: Anti-infectives (From admission, onward)   None       Objective: Vitals:   04/30/19 0545 04/30/19 0615 04/30/19 0645 04/30/19 0715  BP: (!) 142/72 135/72 (!) 143/73 138/70  Pulse: 71 72 71 66  Resp: 17 15 14 13   Temp:      TempSrc:      SpO2: 94% 93% 96%  96%  Weight:       Height:       No intake or output data in the 24 hours ending 04/30/19 0737 Filed Weights   04/29/19 1930  Weight: 86.2 kg   Weight change:   Body mass index is 28.89 kg/m.  Intake/Output from previous day: No intake/output data recorded. Intake/Output this shift: No intake/output data recorded.  Examination:  General exam: Appears calm and comfortable,Not in distress, older fore the age HEENT:PERRL,Oral mucosa moist, Ear/Nose normal on gross exam Respiratory system: Bilateral equal air entry, normal vesicular breath sounds, no wheezes or crackles  Cardiovascular system: S1 & S2 heard,No JVD, murmurs. Gastrointestinal system: Abdomen is  soft, non tender, non distended, BS +  Nervous System:Alert and oriented. No focal neurological deficits/moving extremities, sensation intact. Extremities: RUE fistula w thrill, recent stitches at repair site. No edema, no clubbing, distal peripheral pulses palpable. Skin: No rashes, lesions, no icterus MSK: Normal muscle bulk,tone ,power  Medications:  Scheduled Meds: . [START ON 05/01/2019] aspirin EC  81 mg Oral Daily  . atorvastatin  40 mg Oral QHS  . calcium acetate  667 mg Oral TID WC  . insulin aspart  0-5 Units Subcutaneous QHS  . insulin aspart  0-9 Units Subcutaneous TID WC  . multivitamin  1 tablet Oral QHS  . pantoprazole  40 mg Oral BID  . sodium bicarbonate  1,300 mg Oral BID  . sodium zirconium cyclosilicate  10 g Oral [id number]   Continuous Infusions: . heparin 1,000 Units/hr (04/30/19 0420)    Data Reviewed: I have personally reviewed following labs and imaging studies  CBC: Recent Labs  Lab 04/24/19 1336 04/24/19 1353 04/29/19 1938 04/29/19 1950 04/30/19 0233  WBC  --   --  7.7  --  8.6  NEUTROABS  --   --  5.3  --   --   HGB 14.6 16.0 11.4* 12.6* 10.9*  HCT 43.0 47.0 37.5* 37.0* 34.8*  MCV  --   --  93.8  --  91.3  PLT  --   --  180  --  [id number]   Basic Metabolic Panel: Recent Labs  Lab 04/24/19 1336  04/24/19 1353 04/29/19 1938 04/29/19 1950 04/30/19 0233  NA 135 140 143 143 144  K 8.5* 4.8 5.7* 6.0* 5.6*  CL  --   --  103 110 107  CO2  --   --  15*  --  19*  GLUCOSE 106* 103* 211* 196* 125*  BUN  --   --  91* 89* 93*  CREATININE  --   --  15.20* 15.30* 15.37*  CALCIUM  --   --  8.5*  --  8.5*  MG  --   --  2.2  --  2.2  PHOS  --   --   --   --  8.6*   GFR: Estimated Creatinine Clearance: 4.7 mL/min (A) (by C-G formula based on SCr of 15.37 mg/dL (H)). Liver Function Tests: Recent Labs  Lab 04/29/19 1938 04/30/19 0233  AST 443*  --   ALT 147*  --   ALKPHOS 168*  --   BILITOT 0.4  --   PROT 7.1  --   ALBUMIN 3.1* 3.0*   No results for input(s): LIPASE, AMYLASE in the last 168 hours. No results for input(s): AMMONIA in the last 168 hours. Coagulation Profile: No results for input(s): INR, PROTIME in the last 168 hours. Cardiac Enzymes: No results for input(s): CKTOTAL, CKMB, CKMBINDEX, TROPONINI in  the last 168 hours. BNP (last 3 results) No results for input(s): PROBNP in the last 8760 hours. HbA1C: No results for input(s): HGBA1C in the last 72 hours. CBG: Recent Labs  Lab 04/24/19 1245 04/24/19 1631 04/29/19 1937 04/29/19 2312 04/30/19 0157  GLUCAP 93 79 191* 129* 112*   Lipid Profile: No results for input(s): CHOL, HDL, LDLCALC, TRIG, CHOLHDL, LDLDIRECT in the last 72 hours. Thyroid Function Tests: Recent Labs    04/30/19 0233  TSH 0.436   Anemia Panel: No results for input(s): VITAMINB12, FOLATE, FERRITIN, TIBC, IRON, RETICCTPCT in the last 72 hours. Sepsis Labs: No results for input(s): PROCALCITON, LATICACIDVEN in the last 168 hours.  Recent Results (from the past 240 hour(s))  SARS Coronavirus 2 [facility] order, Performed in [facility] hospital lab) Nasopharyngeal Nasopharyngeal Swab     Status: None   Collection Time: 04/24/19 12:08 PM   Specimen: Nasopharyngeal Swab  Result Value Ref Range Status   SARS Coronavirus 2 NEGATIVE NEGATIVE  Final    Comment: (NOTE) If result is NEGATIVE SARS-CoV-2 target nucleic acids are NOT DETECTED. The SARS-CoV-2 RNA is generally detectable in upper and lower  respiratory specimens during the acute phase of infection. The lowest  concentration of SARS-CoV-2 viral copies this assay can detect is 250  copies / mL. A negative result does not preclude SARS-CoV-2 infection  and should not be used as the sole basis for treatment or other  patient management decisions.  A negative result may occur with  improper specimen collection / handling, submission of specimen other  than nasopharyngeal swab, presence of viral mutation(s) within the  areas targeted by this assay, and inadequate number of viral copies  (<250 copies / mL). A negative result must be combined with clinical  observations, patient history, and epidemiological information. If result is POSITIVE SARS-CoV-2 target nucleic acids are DETECTED. The SARS-CoV-2 RNA is generally detectable in upper and lower  respiratory specimens dur ing the acute phase of infection.  Positive  results are indicative of active infection with SARS-CoV-2.  Clinical  correlation with patient history and other diagnostic information is  necessary to determine patient infection status.  Positive results do  not rule out bacterial infection or co-infection with other viruses. If result is PRESUMPTIVE POSTIVE SARS-CoV-2 nucleic acids MAY BE PRESENT.   A presumptive positive result was obtained on the submitted specimen  and confirmed on repeat testing.  While 2019 novel coronavirus  (SARS-CoV-2) nucleic acids may be present in the submitted sample  additional confirmatory testing may be necessary for epidemiological  and / or clinical management purposes  to differentiate between  SARS-CoV-2 and other Sarbecovirus currently known to infect humans.  If clinically indicated additional testing with an alternate test  methodology [phone]) is advised. The  SARS-CoV-2 RNA is generally  detectable in upper and lower respiratory sp ecimens during the acute  phase of infection. The expected result is Negative. Fact Sheet for Patients:  StrictlyIdeas.no Fact Sheet for Healthcare Providers: BankingDealers.co.za This test is not yet approved or cleared by the [place] FDA and has been authorized for detection and/or diagnosis of SARS-CoV-2 by FDA under an Emergency Use Authorization (EUA).  This EUA will remain in effect (meaning this test can be used) for the duration of the COVID-19 declaration under Section 564(b)(1) of the Act, 21 U.S.C. section 360bbb-3(b)(1), unless the authorization is terminated or revoked sooner. Performed at [facility], [place] [address], [place], [place] [address]   SARS Coronavirus 2 (  Hospital order, Performed in [facility] hospital lab) Nasopharyngeal Nasopharyngeal Swab     Status: None   Collection Time: 04/29/19  7:22 PM   Specimen: Nasopharyngeal Swab  Result Value Ref Range Status   SARS Coronavirus 2 NEGATIVE NEGATIVE Final    Comment: (NOTE) If result is NEGATIVE SARS-CoV-2 target nucleic acids are NOT DETECTED. The SARS-CoV-2 RNA is generally detectable in upper and lower  respiratory specimens during the acute phase of infection. The lowest  concentration of SARS-CoV-2 viral copies this assay can detect is 250  copies / mL. A negative result does not preclude SARS-CoV-2 infection  and should not be used as the sole basis for treatment or other  patient management decisions.  A negative result may occur with  improper specimen collection / handling, submission of specimen other  than nasopharyngeal swab, presence of viral mutation(s) within the  areas targeted by this assay, and inadequate number of viral copies  (<250 copies / mL). A negative result must be combined with clinical  observations, patient history, and epidemiological information.  If result is POSITIVE SARS-CoV-2 target nucleic acids are DETECTED. The SARS-CoV-2 RNA is generally detectable in upper and lower  respiratory specimens dur ing the acute phase of infection.  Positive  results are indicative of active infection with SARS-CoV-2.  Clinical  correlation with patient history and other diagnostic information is  necessary to determine patient infection status.  Positive results do  not rule out bacterial infection or co-infection with other viruses. If result is PRESUMPTIVE POSTIVE SARS-CoV-2 nucleic acids MAY BE PRESENT.   A presumptive positive result was obtained on the submitted specimen  and confirmed on repeat testing.  While 2019 novel coronavirus  (SARS-CoV-2) nucleic acids may be present in the submitted sample  additional confirmatory testing may be necessary for epidemiological  and / or clinical management purposes  to differentiate between  SARS-CoV-2 and other Sarbecovirus currently known to infect humans.  If clinically indicated additional testing with an alternate test  methodology [phone]) is advised. The SARS-CoV-2 RNA is generally  detectable in upper and lower respiratory sp ecimens during the acute  phase of infection. The expected result is Negative. Fact Sheet for Patients:  StrictlyIdeas.no Fact Sheet for Healthcare Providers: BankingDealers.co.za This test is not yet approved or cleared by the [place] FDA and has been authorized for detection and/or diagnosis of SARS-CoV-2 by FDA under an Emergency Use Authorization (EUA).  This EUA will remain in effect (meaning this test can be used) for the duration of the COVID-19 declaration under Section 564(b)(1) of the Act, 21 U.S.C. section 360bbb-3(b)(1), unless the authorization is terminated or revoked sooner. Performed at [facility], [place] [address], [place], [place] [address]       Radiology Studies: Ct Head Wo  Contrast  Result Date: 04/29/2019 CLINICAL DATA:  Seizure, new, nontraumatic, >40 yrs EXAM: CT HEAD WITHOUT CONTRAST TECHNIQUE: Contiguous axial images were obtained from the base of the skull through the vertex without intravenous contrast. COMPARISON:  Head CT 02/08/2018 FINDINGS: Brain: No intracranial hemorrhage, mass effect, or midline shift. Generalized atrophy. Unchanged chronic small vessel ischemia, moderate for age. No hydrocephalus. The basilar cisterns are patent. No evidence of territorial infarct or acute ischemia. No extra-axial or intracranial fluid collection. Vascular: Atherosclerosis of skullbase vasculature without hyperdense vessel or abnormal calcification. Skull: No fracture or focal lesion. Sinuses/Orbits: Paranasal sinuses and mastoid air cells are clear. The visualized orbits are unremarkable. Left cataract resection. Other: None. IMPRESSION: 1.  No acute intracranial abnormality. 2. Unchanged atrophy and chronic small vessel ischemia. Electronically Signed   By: Keith Rake M.D.   On: 04/29/2019 22:33   Ct Angio Chest Pe W And/or Wo Contrast  Result Date: 04/30/2019 CLINICAL DATA:  PE suspected, high pretest prob Patient found unresponsive in bathroom, missed last 3 dialysis appointments. EXAM: CT ANGIOGRAPHY CHEST WITH CONTRAST TECHNIQUE: Multidetector CT imaging of the chest was performed using the standard protocol during bolus administration of intravenous contrast. Multiplanar CT image reconstructions and MIPs were obtained to evaluate the vascular anatomy. CONTRAST:  32mL OMNIPAQUE IOHEXOL 350 MG/ML SOLN COMPARISON:  Radiograph yesterday. No prior chest CT. Lung bases from abdominal CT 02/08/2018 FINDINGS: Cardiovascular: There are no filling defects within the pulmonary arteries to suggest pulmonary embolus. Aortic atherosclerosis without aneurysm or dissection. Coronary artery calcifications. Mild cardiomegaly. No pericardial effusion. Mediastinum/Nodes: Enlarged lower  paratracheal node at 14 mm, series 5, image 36. Enlarged right hilar node measuring 2.6 cm, image 50. Multiple additional mildly enlarged and prominent bilateral hilar and mediastinal nodes. Decompressed esophagus. Small hiatal hernia. High-density material in the distal esophagus and proximal stomach. No visualized thyroid nodule. Lungs/Pleura: Fine reticular and ground-glass opacities present in both lungs, right greater than left and more prominent at the lung bases. Similar reticular opacities seen on prior abdominal CT, however currently increased from that exam. Clustered calcified pulmonary nodules in the right lung. Mild irregular fissural thickening. No septal thickening or pleural fluid. Central bronchial thickening, trachea and mainstem bronchi are patent. Upper Abdomen: Small hiatal hernia. No acute findings. Musculoskeletal: There are no acute or suspicious osseous abnormalities. Review of the MIP images confirms the above findings. IMPRESSION: 1. No pulmonary embolus. 2. Fine reticular and ground-glass opacities in both lungs, right greater than left and more prominent at the lung bases. Similar reticular opacities seen on 2019 abdominal CT, however currently increased from that exam. Suspect pulmonary edema or acute infection superimposed on chronic interstitial lung disease. Consider nonemergent high resolution chest CT for characterization. 3. Mild mediastinal and right hilar adenopathy is likely reactive, but nonspecific. 4. Aortic Atherosclerosis (ICD10-I70.0). Coronary artery calcifications. 5. Small hiatal hernia. Electronically Signed   By: Keith Rake M.D.   On: 04/30/2019 00:25   Dg Chest Portable 1 View  Result Date: 04/29/2019 CLINICAL DATA:  Dyspnea. Syncope. Found unresponsive in bathroom. Missed dialysis. EXAM: PORTABLE CHEST 1 VIEW COMPARISON:  Radiograph 12/10/2018 FINDINGS: Cardiomegaly is unchanged. Unchanged mediastinal contours. Fine reticular opacities throughout both  lungs which are more prominent than on prior exam. Small right and possibly left pleural effusions. No confluent airspace disease. No pneumothorax. No acute osseous abnormalities. IMPRESSION: Cardiomegaly with pulmonary edema and small right and possibly left pleural effusions. Electronically Signed   By: Keith Rake M.D.   On: 04/29/2019 20:25      LOS: 1 day   Time spent: More than 50% of that time was spent in counseling and/or coordination of care.  Antonieta Pert, MD [facility]  04/30/2019, 7:37 AM

## 2019-04-30 NOTE — Progress Notes (Signed)
Discussed results of repeat high-sensitivity troponin with cardiology team, Dr. Kalman Shan.  Repeat high-sensitivity troponin came back as 4723.  Dr. Kalman Shan will see patient, and will also take care of the need for heparin on further cardiac work-up.  Cardiology and PT is highly appreciated.  CTA chest is negative for pulmonary embolism.

## 2019-04-30 NOTE — ED Notes (Signed)
ECHO currently competing the exam.

## 2019-04-30 NOTE — ED Notes (Signed)
ED TO INPATIENT HANDOFF REPORT  ED [name] Name and Phone #: Celene Squibb RN  S Name/Age/Gender Blake [name] 73 y.o. male Room/Bed: 037C/037C  Code Status   Code Status: Full Code  Home/SNF/Other Home Patient oriented to: self, place, time and situation Is this baseline? Yes   Triage Complete: Triage complete  Chief Complaint unresponsive  Triage Note Ems reports that pt was found unresponsive in the bathroom. gcs of 5, then gcs of 9, ems reports missing dialysis 3x times. Last dialysis was 04/24/2019, pt is currently verbal AOx4 upon arrival. 3x episodes of vomiting per ems. Pt has new fistula places on the right arm, places 04/24/2019. New site is red ans warm to touch.   Allergies No Known Allergies  Level of Care/Admitting Diagnosis ED Disposition    ED Disposition Condition [facility] Area: [place] [[id number]]  Level of Care: Progressive [102]  Covid Evaluation: Asymptomatic Screening Protocol (No Symptoms)  Diagnosis: Acute encephalopathy [id number]  Admitting Physician: Shirlean Mylar  Attending Physician: Dana Allan I [[id number]]  Estimated length of stay: 3 - 4 days  Certification:: I certify this patient will need inpatient services for at least 2 midnights  PT Class (Do Not Modify): Inpatient [101]  PT Acc Code (Do Not Modify): Private [1]       B Medical/Surgery History Past Medical History:  Diagnosis Date  . Acute osteomyelitis of toe of right foot ([place]) 08/02/2015  . Arthritis    "maybe RUE/hand" (02/08/2018)  . ESRD (end stage renal disease) on dialysis [facility])    "TTS; [place]" (02/08/2018)  . Hyperlipidemia   . Hypertension   . Type II diabetes mellitus ([place])    Past Surgical History:  Procedure Laterality Date  . AMPUTATION TOE Right 08/04/2015   Procedure: RIGHT GREAT TOE AMPUTATION;  Surgeon: [name], MD;  Location: [place];  Service: Orthopedics;  Laterality: Right;  . AV FISTULA  PLACEMENT Right 07/05/2014   Procedure: ARTERIOVENOUS BRACHIALCEPHALIC (AV) FISTULA CREATION;  Surgeon: Conrad [place], MD;  Location: [place];  Service: Vascular;  Laterality: Right;  . AV FISTULA PLACEMENT Right 04/24/2019   Procedure: REVISION RIGHT ARM ARTERIOVENOUS (AV) FISTULA;  Surgeon: Serafina Mitchell, MD;  Location: [place];  Service: Vascular;  Laterality: Right;  . BACK SURGERY    . ESOPHAGOGASTRODUODENOSCOPY (EGD) WITH PROPOFOL N/A 11/28/2015   Procedure: ESOPHAGOGASTRODUODENOSCOPY (EGD) WITH PROPOFOL;  Surgeon: [name], MD;  Location: [facility];  Service: Endoscopy;  Laterality: N/A;  . INSERTION OF DIALYSIS CATHETER Left 07/05/2014   Procedure: INSERTION OF DIALYSIS CATHETER-LEFT INTERNAL JUGULAR PLACEMENT;  Surgeon: Conrad Waihee-Waiehu, MD;  Location: [place];  Service: Vascular;  Laterality: Left;  . LUMBAR DISC SURGERY    . REMOVAL OF A DIALYSIS CATHETER Right 07/05/2014   Procedure: REMOVAL OF A DIALYSIS CATHETER;  Surgeon: Conrad [place]ie[place], MD;  Location: [place];  Service: Vascular;  Laterality: Right;     A IV Location/Drains/Wounds Patient Lines/Drains/Airways Status   Active Line/Drains/Airways    Name:   Placement date:   Placement time:   Site:   Days:   Peripheral IV 04/29/19 Left Forearm   04/29/19    1927    Forearm   1   Fistula / Graft Right Upper arm Arteriovenous fistula   07/05/14    -    Upper arm   1760   Incision (Closed) 07/05/14 Chest Left   07/05/14    1450  1760   Incision (Closed) 07/05/14 Neck Right   07/05/14    1450     1760   Incision (Closed) 07/05/14 Arm Right   07/05/14    1518     1760   Incision (Closed) 08/04/15 Foot   08/04/15    1900     1365   Incision (Closed) 04/24/19 Arm Right   04/24/19    1551     6   Wound / Incision (Open or Dehisced) 08/01/15 Diabetic ulcer Toe (Comment  which one) Right   08/01/15    1915    Toe (Comment  which one)   1368          Intake/Output Last 24 hours No intake or output data in the 24 hours ending  04/30/19 1021  Labs/Imaging Results for orders placed or performed during the hospital encounter of 04/29/19 (from the past 48 hour(s))  SARS Coronavirus 2 [facility] order, Performed in [facility] hospital lab) Nasopharyngeal Nasopharyngeal Swab     Status: None   Collection Time: 04/29/19  7:22 PM   Specimen: Nasopharyngeal Swab  Result Value Ref Range   SARS Coronavirus 2 NEGATIVE NEGATIVE    Comment: (NOTE) If result is NEGATIVE SARS-CoV-2 target nucleic acids are NOT DETECTED. The SARS-CoV-2 RNA is generally detectable in upper and lower  respiratory specimens during the acute phase of infection. The lowest  concentration of SARS-CoV-2 viral copies this assay can detect is 250  copies / mL. A negative result does not preclude SARS-CoV-2 infection  and should not be used as the sole basis for treatment or other  patient management decisions.  A negative result may occur with  improper specimen collection / handling, submission of specimen other  than nasopharyngeal swab, presence of viral mutation(s) within the  areas targeted by this assay, and inadequate number of viral copies  (<250 copies / mL). A negative result must be combined with clinical  observations, patient history, and epidemiological information. If result is POSITIVE SARS-CoV-2 target nucleic acids are DETECTED. The SARS-CoV-2 RNA is generally detectable in upper and lower  respiratory specimens dur ing the acute phase of infection.  Positive  results are indicative of active infection with SARS-CoV-2.  Clinical  correlation with patient history and other diagnostic information is  necessary to determine patient infection status.  Positive results do  not rule out bacterial infection or co-infection with other viruses. If result is PRESUMPTIVE POSTIVE SARS-CoV-2 nucleic acids MAY BE PRESENT.   A presumptive positive result was obtained on the submitted specimen  and confirmed on repeat testing.  While 2019 novel  coronavirus  (SARS-CoV-2) nucleic acids may be present in the submitted sample  additional confirmatory testing may be necessary for epidemiological  and / or clinical management purposes  to differentiate between  SARS-CoV-2 and other Sarbecovirus currently known to infect humans.  If clinically indicated additional testing with an alternate test  methodology [phone]) is advised. The SARS-CoV-2 RNA is generally  detectable in upper and lower respiratory sp ecimens during the acute  phase of infection. The expected result is Negative. Fact Sheet for Patients:  StrictlyIdeas.no Fact Sheet for Healthcare Providers: BankingDealers.co.za This test is not yet approved or cleared by the [place] FDA and has been authorized for detection and/or diagnosis of SARS-CoV-2 by FDA under an Emergency Use Authorization (EUA).  This EUA will remain in effect (meaning this test can be used) for the duration of the COVID-19 declaration under Section 564(b)(1) of the  Act, 21 U.S.C. section 360bbb-3(b)(1), unless the authorization is terminated or revoked sooner. Performed at [facility], [place] [address], [place], [place] [address]   CBG monitoring, ED     Status: Abnormal   Collection Time: 04/29/19  7:37 PM  Result Value Ref Range   Glucose-Capillary 191 (H) 70 - 99 mg/dL  CBC with Differential/Platelet     Status: Abnormal   Collection Time: 04/29/19  7:38 PM  Result Value Ref Range   WBC 7.7 4.0 - 10.5 K/uL   RBC 4.00 (L) 4.22 - 5.81 MIL/uL   Hemoglobin 11.4 (L) 13.0 - 17.0 g/dL   HCT 37.5 (L) 39.0 - 52.0 %   MCV 93.8 80.0 - 100.0 fL   MCH 28.5 26.0 - 34.0 pg   MCHC 30.4 30.0 - 36.0 g/dL   RDW 14.6 11.5 - 15.5 %   Platelets 180 150 - 400 K/uL   nRBC 0.0 0.0 - 0.2 %   Neutrophils Relative % 67 %   Neutro Abs 5.3 1.7 - 7.7 K/uL   Lymphocytes Relative 19 %   Lymphs Abs 1.5 0.7 - 4.0 K/uL   Monocytes Relative 7 %   Monocytes Absolute  0.5 0.1 - 1.0 K/uL   Eosinophils Relative 5 %   Eosinophils Absolute 0.4 0.0 - 0.5 K/uL   Basophils Relative 1 %   Basophils Absolute 0.0 0.0 - 0.1 K/uL   Immature Granulocytes 1 %   Abs Immature Granulocytes 0.05 0.00 - 0.07 K/uL    Comment: Performed at [facility], [address], [place], [place] [address]  Comprehensive metabolic panel     Status: Abnormal   Collection Time: 04/29/19  7:38 PM  Result Value Ref Range   Sodium 143 135 - 145 mmol/L   Potassium 5.7 (H) 3.5 - 5.1 mmol/L   Chloride 103 98 - 111 mmol/L   CO2 15 (L) 22 - 32 mmol/L   Glucose, Bld 211 (H) 70 - 99 mg/dL   BUN 91 (H) 8 - 23 mg/dL   Creatinine, Ser 15.20 (H) 0.61 - 1.24 mg/dL   Calcium 8.5 (L) 8.9 - 10.3 mg/dL   Total Protein 7.1 6.5 - 8.1 g/dL   Albumin 3.1 (L) 3.5 - 5.0 g/dL   AST 443 (H) 15 - 41 U/L   ALT 147 (H) 0 - 44 U/L   Alkaline Phosphatase 168 (H) 38 - 126 U/L   Total Bilirubin 0.4 0.3 - 1.2 mg/dL   GFR calc non Af Amer 3 (L) >60 mL/min   GFR calc Af Amer 3 (L) >60 mL/min   Anion gap 25 (H) 5 - 15    Comment: Performed at [facility], [place] [address], [place], [place] [address]  Magnesium     Status: None   Collection Time: 04/29/19  7:38 PM  Result Value Ref Range   Magnesium 2.2 1.7 - 2.4 mg/dL    Comment: Performed at [facility], [place] [address], [place], [place] [address]  Troponin I (High Sensitivity)     Status: Abnormal   Collection Time: 04/29/19  7:38 PM  Result Value Ref Range   Troponin I (High Sensitivity) 169 (HH) <18 ng/L    Comment: CRITICAL RESULT CALLED TO, READ BACK BY AND VERIFIED WITH: JOHNSTON P,RN 04/29/19 2204 WAYK Performed at [facility], [place] [address] [place], [place] [address]   I-stat chem 8, ED (not at [facility] or [facility])     Status:  Abnormal   Collection Time: 04/29/19  7:50 PM  Result Value Ref Range   Sodium 143 135 - 145 mmol/L   Potassium 6.0 (H) 3.5 - 5.1 mmol/L   Chloride 110 98 - 111 mmol/L   BUN 89 (H) 8 - 23 mg/dL    Creatinine, Ser 15.30 (H) 0.61 - 1.24 mg/dL   Glucose, Bld 196 (H) 70 - 99 mg/dL   Calcium, Ion 1.05 (L) 1.15 - 1.40 mmol/L   TCO2 17 (L) 22 - 32 mmol/L   Hemoglobin 12.6 (L) 13.0 - 17.0 g/dL   HCT 37.0 (L) 39.0 - 52.0 %  Troponin I (High Sensitivity)     Status: Abnormal   Collection Time: 04/29/19 10:20 PM  Result Value Ref Range   Troponin I (High Sensitivity) 4,723 (HH) <18 ng/L    Comment: CRITICAL RESULT CALLED TO, READ BACK BY AND VERIFIED WITH: MUNNETT [facility] 04/29/19 2313 WAYK Performed at [facility], [place] [address] [place], [place] [address]   CBG monitoring, ED     Status: Abnormal   Collection Time: 04/29/19 11:12 PM  Result Value Ref Range   Glucose-Capillary 129 (H) 70 - 99 mg/dL  CBG monitoring, ED     Status: Abnormal   Collection Time: 04/30/19  1:57 AM  Result Value Ref Range   Glucose-Capillary 112 (H) 70 - 99 mg/dL  CBC     Status: Abnormal   Collection Time: 04/30/19  2:33 AM  Result Value Ref Range   WBC 8.6 4.0 - 10.5 K/uL   RBC 3.81 (L) 4.22 - 5.81 MIL/uL   Hemoglobin 10.9 (L) 13.0 - 17.0 g/dL   HCT 34.8 (L) 39.0 - 52.0 %   MCV 91.3 80.0 - 100.0 fL   MCH 28.6 26.0 - 34.0 pg   MCHC 31.3 30.0 - 36.0 g/dL   RDW 14.7 11.5 - 15.5 %   Platelets 180 150 - 400 K/uL   nRBC 0.0 0.0 - 0.2 %    Comment: Performed at [facility], [place], [place], [place] [address]  Magnesium     Status: None   Collection Time: 04/30/19  2:33 AM  Result Value Ref Range   Magnesium 2.2 1.7 - 2.4 mg/dL    Comment: Performed at [facility], [place] [address], [place], [place] [address]  TSH     Status: None   Collection Time: 04/30/19  2:33 AM  Result Value Ref Range   TSH 0.436 0.350 - 4.500 uIU/mL    Comment: Performed by a 3rd Generation assay with a functional sensitivity of <=0.01 uIU/mL. Performed at [facility], [place] [address] [place], [place] [address]   Troponin I (High Sensitivity)     Status: Abnormal   Collection Time: 04/30/19   2:33 AM  Result Value Ref Range   Troponin I (High Sensitivity) 18,435 (HH) <18 ng/L    Comment: CRITICAL RESULT CALLED TO, READ BACK BY AND VERIFIED WITH: MUNNETT [facility] 04/30/19 0351 WAYK Performed at [facility], [place] [address], [place], [place] [address]   Renal function panel     Status: Abnormal   Collection Time: 04/30/19  2:33 AM  Result Value Ref Range   Sodium 144 135 - 145 mmol/L   Potassium 5.6 (H) 3.5 - 5.1 mmol/L   Chloride 107 98 - 111 mmol/L   CO2 19 (L) 22 - 32 mmol/L   Glucose, Bld 125 (H) 70 - 99 mg/dL   BUN  93 (H) 8 - 23 mg/dL   Creatinine, Ser 15.37 (H) 0.61 - 1.24 mg/dL   Calcium 8.5 (L) 8.9 - 10.3 mg/dL   Phosphorus 8.6 (H) 2.5 - 4.6 mg/dL   Albumin 3.0 (L) 3.5 - 5.0 g/dL   GFR calc non Af Amer 3 (L) >60 mL/min   GFR calc Af Amer 3 (L) >60 mL/min   Anion gap 18 (H) 5 - 15    Comment: Performed at [facility] [address] [place], [place] [address]  CBG monitoring, ED     Status: Abnormal   Collection Time: 04/30/19  8:18 AM  Result Value Ref Range   Glucose-Capillary 67 (L) 70 - 99 mg/dL   Ct Head Wo Contrast  Result Date: 04/29/2019 CLINICAL DATA:  Seizure, new, nontraumatic, >40 yrs EXAM: CT HEAD WITHOUT CONTRAST TECHNIQUE: Contiguous axial images were obtained from the base of the skull through the vertex without intravenous contrast. COMPARISON:  Head CT 02/08/2018 FINDINGS: Brain: No intracranial hemorrhage, mass effect, or midline shift. Generalized atrophy. Unchanged chronic small vessel ischemia, moderate for age. No hydrocephalus. The basilar cisterns are patent. No evidence of territorial infarct or acute ischemia. No extra-axial or intracranial fluid collection. Vascular: Atherosclerosis of skullbase vasculature without hyperdense vessel or abnormal calcification. Skull: No fracture or focal lesion. Sinuses/Orbits: Paranasal sinuses and mastoid air cells are clear. The visualized orbits are unremarkable. Left cataract resection. Other:  None. IMPRESSION: 1. No acute intracranial abnormality. 2. Unchanged atrophy and chronic small vessel ischemia. Electronically Signed   By: Keith Rake M.D.   On: 04/29/2019 22:33   Ct Angio Chest Pe W And/or Wo Contrast  Result Date: 04/30/2019 CLINICAL DATA:  PE suspected, high pretest prob Patient found unresponsive in bathroom, missed last 3 dialysis appointments. EXAM: CT ANGIOGRAPHY CHEST WITH CONTRAST TECHNIQUE: Multidetector CT imaging of the chest was performed using the standard protocol during bolus administration of intravenous contrast. Multiplanar CT image reconstructions and MIPs were obtained to evaluate the vascular anatomy. CONTRAST:  2mL OMNIPAQUE IOHEXOL 350 MG/ML SOLN COMPARISON:  Radiograph yesterday. No prior chest CT. Lung bases from abdominal CT 02/08/2018 FINDINGS: Cardiovascular: There are no filling defects within the pulmonary arteries to suggest pulmonary embolus. Aortic atherosclerosis without aneurysm or dissection. Coronary artery calcifications. Mild cardiomegaly. No pericardial effusion. Mediastinum/Nodes: Enlarged lower paratracheal node at 14 mm, series 5, image 36. Enlarged right hilar node measuring 2.6 cm, image 50. Multiple additional mildly enlarged and prominent bilateral hilar and mediastinal nodes. Decompressed esophagus. Small hiatal hernia. High-density material in the distal esophagus and proximal stomach. No visualized thyroid nodule. Lungs/Pleura: Fine reticular and ground-glass opacities present in both lungs, right greater than left and more prominent at the lung bases. Similar reticular opacities seen on prior abdominal CT, however currently increased from that exam. Clustered calcified pulmonary nodules in the right lung. Mild irregular fissural thickening. No septal thickening or pleural fluid. Central bronchial thickening, trachea and mainstem bronchi are patent. Upper Abdomen: Small hiatal hernia. No acute findings. Musculoskeletal: There are no  acute or suspicious osseous abnormalities. Review of the MIP images confirms the above findings. IMPRESSION: 1. No pulmonary embolus. 2. Fine reticular and ground-glass opacities in both lungs, right greater than left and more prominent at the lung bases. Similar reticular opacities seen on 2019 abdominal CT, however currently increased from that exam. Suspect pulmonary edema or acute infection superimposed on chronic interstitial lung disease. Consider nonemergent high resolution chest CT for characterization. 3. Mild mediastinal and right hilar adenopathy  is likely reactive, but nonspecific. 4. Aortic Atherosclerosis (ICD10-I70.0). Coronary artery calcifications. 5. Small hiatal hernia. Electronically Signed   By: Keith Rake M.D.   On: 04/30/2019 00:25   Dg Chest Portable 1 View  Result Date: 04/29/2019 CLINICAL DATA:  Dyspnea. Syncope. Found unresponsive in bathroom. Missed dialysis. EXAM: PORTABLE CHEST 1 VIEW COMPARISON:  Radiograph 12/10/2018 FINDINGS: Cardiomegaly is unchanged. Unchanged mediastinal contours. Fine reticular opacities throughout both lungs which are more prominent than on prior exam. Small right and possibly left pleural effusions. No confluent airspace disease. No pneumothorax. No acute osseous abnormalities. IMPRESSION: Cardiomegaly with pulmonary edema and small right and possibly left pleural effusions. Electronically Signed   By: Keith Rake M.D.   On: 04/29/2019 20:25    Pending Labs Unresulted Labs (From admission, onward)    Start     Ordered   05/01/19 0500  Heparin level (unfractionated)  Daily,   R     04/30/19 0305   05/01/19 [id number]  Basic metabolic panel  Daily,   R    Question:  Specimen collection method  Answer:  Lab=Lab collect   04/30/19 0746   05/01/19 0500  CBC  Daily,   R    Question:  Specimen collection method  Answer:  Lab=Lab collect   04/30/19 0746   04/30/19 1000  Heparin level (unfractionated)  Once-Timed,   STAT     04/30/19 0305    04/29/19 1921  CBC with Differential  Once,   STAT     04/29/19 1921          Vitals/Pain Today's Vitals   04/30/19 0915 04/30/19 0930 04/30/19 0945 04/30/19 1000  BP: (!) 165/77 (!) 176/82 (!) 162/75 (!) 157/75  Pulse: 71 72 70   Resp: 19 15 18 15   Temp:      TempSrc:      SpO2: 100% 100% 100%   Weight:      Height:      PainSc:        Isolation Precautions No active isolations  Medications Medications  calcium acetate (PHOSLO) capsule 667 mg (667 mg Oral Given 04/30/19 0834)  pantoprazole (PROTONIX) EC tablet 40 mg (40 mg Oral Given 04/30/19 0227)  multivitamin (RENA-VIT) tablet 1 tablet (1 tablet Oral Given 04/30/19 0225)  insulin aspart (novoLOG) injection 0-5 Units (0 Units Subcutaneous Not Given 04/30/19 0204)  insulin aspart (novoLOG) injection 0-9 Units (0 Units Subcutaneous Not Given 04/30/19 0827)  heparin ADULT infusion 100 units/mL (25000 units/280mL sodium chloride 0.45%) (1,000 Units/hr Intravenous New Bag/Given 04/30/19 0420)  aspirin EC tablet 81 mg (has no administration in time range)  atorvastatin (LIPITOR) tablet 40 mg (has no administration in time range)  perflutren lipid microspheres (DEFINITY) IV suspension (4 mLs Intravenous Given 04/30/19 1004)  calcium gluconate 1 g/ 50 mL sodium chloride IVPB (0 g Intravenous Stopped 04/29/19 2030)  sodium zirconium cyclosilicate (LOKELMA) packet 10 g (10 g Oral Given 04/30/19 0834)  iohexol (OMNIPAQUE) 350 MG/ML injection 75 mL (75 mLs Intravenous Contrast Given 04/29/19 2355)  heparin bolus via infusion 4,000 Units (4,000 Units Intravenous Bolus from Bag 04/30/19 0418)  aspirin chewable tablet 324 mg (324 mg Oral Given 04/30/19 0422)    Mobility walks High fall risk   Focused Assessments Cardiac Assessment Handoff:  Cardiac Rhythm: Normal sinus rhythm Lab Results  Component Value Date   TROPONINI <0.03 12/22/2018   No results found for: DDIMER Does the Patient currently have chest pain? No      R Recommendations: See  Admitting Provider Note  Report given to:   Additional Notes:

## 2019-04-30 NOTE — ED Notes (Signed)
Checked CBG 112, RN Phillip informed

## 2019-04-30 NOTE — ED Notes (Signed)
Heparin dose  Verified andruw rn

## 2019-04-30 NOTE — Anesthesia Procedure Notes (Signed)
Procedure Name: MAC Date/Time: 04/30/2019 3:00 PM Performed by: Inda Coke, CRNA Pre-anesthesia Checklist: Patient identified, Emergency Drugs available, Suction available, Timeout performed and Patient being monitored Patient Re-evaluated:Patient Re-evaluated prior to induction Oxygen Delivery Method: Simple face mask Induction Type: IV induction Dental Injury: Teeth and Oropharynx as per pre-operative assessment

## 2019-04-30 NOTE — ED Notes (Signed)
ED TO INPATIENT HANDOFF REPORT  ED Nurse Name and Phone #: [phone]  S Name/Age/Gender [name] 73 y.o. male Room/Bed: 032C/032C  Code Status   Code Status: Full Code  Home/SNF/Other Home Patient oriented to: self, place, time and situation Is this baseline? Yes   Triage Complete: Triage complete  Chief Complaint unresponsive  Triage Note Ems reports that pt was found unresponsive in the bathroom. gcs of 5, then gcs of 9, ems reports missing dialysis 3x times. Last dialysis was 04/24/2019, pt is currently verbal AOx4 upon arrival. 3x episodes of vomiting per ems. Pt has new fistula places on the right arm, places 04/24/2019. New site is red ans warm to touch.   Allergies No Known Allergies  Level of Care/Admitting Diagnosis ED Disposition    ED Disposition Condition [facility] Area: [place] [[id number]]  Level of Care: Progressive [102]  Covid Evaluation: Asymptomatic Screening Protocol (No Symptoms)  Diagnosis: Acute encephalopathy [id number]  Admitting Physician: Shirlean Mylar  Attending Physician: Dana Allan I [[id number]]  Estimated length of stay: 3 - 4 days  Certification:: I certify this patient will need inpatient services for at least 2 midnights  PT Class (Do Not Modify): Inpatient [101]  PT Acc Code (Do Not Modify): Private [1]       B Medical/Surgery History Past Medical History:  Diagnosis Date  . Acute osteomyelitis of toe of right foot ([place]) 08/02/2015  . Arthritis    "maybe RUE/hand" (02/08/2018)  . ESRD (end stage renal disease) on dialysis [facility])    "TTS; [place]" (02/08/2018)  . Hyperlipidemia   . Hypertension   . Type II diabetes mellitus ([place])    Past Surgical History:  Procedure Laterality Date  . AMPUTATION TOE Right 08/04/2015   Procedure: RIGHT GREAT TOE AMPUTATION;  Surgeon: [name], MD;  Location: [place];  Service: Orthopedics;  Laterality: Right;  . AV FISTULA PLACEMENT  Right 07/05/2014   Procedure: ARTERIOVENOUS BRACHIALCEPHALIC (AV) FISTULA CREATION;  Surgeon: Conrad Langlois, MD;  Location: [place];  Service: Vascular;  Laterality: Right;  . AV FISTULA PLACEMENT Right 04/24/2019   Procedure: REVISION RIGHT ARM ARTERIOVENOUS (AV) FISTULA;  Surgeon: Serafina Mitchell, MD;  Location: [place];  Service: Vascular;  Laterality: Right;  . BACK SURGERY    . ESOPHAGOGASTRODUODENOSCOPY (EGD) WITH PROPOFOL N/A 11/28/2015   Procedure: ESOPHAGOGASTRODUODENOSCOPY (EGD) WITH PROPOFOL;  Surgeon: [name], MD;  Location[facility];  Service: Endoscopy;  Laterality: N/A;  . INSERTION OF DIALYSIS CATHETER Left 07/05/2014   Procedure: INSERTION OF DIALYSIS CATHETER-LEFT INTERNAL JUGULAR PLACEMENT;  Surgeon: Conrad Irondale, MD;  Location: [place];  Service: Vascular;  Laterality: Left;  . LUMBAR DISC SURGERY    . REMOVAL OF A DIALYSIS CATHETER Right 07/05/2014   Procedure: REMOVAL OF A DIALYSIS CATHETER;  Surgeon: Conrad , MD;  Location: [place];  Service: Vascular;  Laterality: Right;     A IV Location/Drains/Wounds Patient Lines/Drains/Airways Status   Active Line/Drains/Airways    Name:   Placement date:   Placement time:   Site:   Days:   Peripheral IV 04/29/19 Left Forearm   04/29/19    1927    Forearm   1   Fistula / Graft Right Upper arm Arteriovenous fistula   07/05/14    -    Upper arm   1760   Incision (Closed) 07/05/14 Chest Left   07/05/14    1450  1760   Incision (Closed) 07/05/14 Neck Right   07/05/14    1450     1760   Incision (Closed) 07/05/14 Arm Right   07/05/14    1518     1760   Incision (Closed) 08/04/15 Foot   08/04/15    1900     1365   Incision (Closed) 04/24/19 Arm Right   04/24/19    1551     6   Wound / Incision (Open or Dehisced) 08/01/15 Diabetic ulcer Toe (Comment  which one) Right   08/01/15    1915    Toe (Comment  which one)   1368          Intake/Output Last 24 hours No intake or output data in the 24 hours ending 04/30/19  0100  Labs/Imaging Results for orders placed or performed during the hospital encounter of 04/29/19 (from the past 48 hour(s))  SARS Coronavirus 2 [facility] order, Performed in [facility] hospital lab) Nasopharyngeal Nasopharyngeal Swab     Status: None   Collection Time: 04/29/19  7:22 PM   Specimen: Nasopharyngeal Swab  Result Value Ref Range   SARS Coronavirus 2 NEGATIVE NEGATIVE    Comment: (NOTE) If result is NEGATIVE SARS-CoV-2 target nucleic acids are NOT DETECTED. The SARS-CoV-2 RNA is generally detectable in upper and lower  respiratory specimens during the acute phase of infection. The lowest  concentration of SARS-CoV-2 viral copies this assay can detect is 250  copies / mL. A negative result does not preclude SARS-CoV-2 infection  and should not be used as the sole basis for treatment or other  patient management decisions.  A negative result may occur with  improper specimen collection / handling, submission of specimen other  than nasopharyngeal swab, presence of viral mutation(s) within the  areas targeted by this assay, and inadequate number of viral copies  (<250 copies / mL). A negative result must be combined with clinical  observations, patient history, and epidemiological information. If result is POSITIVE SARS-CoV-2 target nucleic acids are DETECTED. The SARS-CoV-2 RNA is generally detectable in upper and lower  respiratory specimens dur ing the acute phase of infection.  Positive  results are indicative of active infection with SARS-CoV-2.  Clinical  correlation with patient history and other diagnostic information is  necessary to determine patient infection status.  Positive results do  not rule out bacterial infection or co-infection with other viruses. If result is PRESUMPTIVE POSTIVE SARS-CoV-2 nucleic acids MAY BE PRESENT.   A presumptive positive result was obtained on the submitted specimen  and confirmed on repeat testing.  While 2019 novel  coronavirus  (SARS-CoV-2) nucleic acids may be present in the submitted sample  additional confirmatory testing may be necessary for epidemiological  and / or clinical management purposes  to differentiate between  SARS-CoV-2 and other Sarbecovirus currently known to infect humans.  If clinically indicated additional testing with an alternate test  methodology [phone]) is advised. The SARS-CoV-2 RNA is generally  detectable in upper and lower respiratory sp ecimens during the acute  phase of infection. The expected result is Negative. Fact Sheet for Patients:  StrictlyIdeas.no Fact Sheet for Healthcare Providers: BankingDealers.co.za This test is not yet approved or cleared by the [place] FDA and has been authorized for detection and/or diagnosis of SARS-CoV-2 by FDA under an Emergency Use Authorization (EUA).  This EUA will remain in effect (meaning this test can be used) for the duration of the COVID-19 declaration under Section 564(b)(1) of the  Act, 21 U.S.C. section 360bbb-3(b)(1), unless the authorization is terminated or revoked sooner. Performed at [facility], [place] [address], [place], [place] [address]   CBG monitoring, ED     Status: Abnormal   Collection Time: 04/29/19  7:37 PM  Result Value Ref Range   Glucose-Capillary 191 (H) 70 - 99 mg/dL  CBC with Differential/Platelet     Status: Abnormal   Collection Time: 04/29/19  7:38 PM  Result Value Ref Range   WBC 7.7 4.0 - 10.5 K/uL   RBC 4.00 (L) 4.22 - 5.81 MIL/uL   Hemoglobin 11.4 (L) 13.0 - 17.0 g/dL   HCT 37.5 (L) 39.0 - 52.0 %   MCV 93.8 80.0 - 100.0 fL   MCH 28.5 26.0 - 34.0 pg   MCHC 30.4 30.0 - 36.0 g/dL   RDW 14.6 11.5 - 15.5 %   Platelets 180 150 - 400 K/uL   nRBC 0.0 0.0 - 0.2 %   Neutrophils Relative % 67 %   Neutro Abs 5.3 1.7 - 7.7 K/uL   Lymphocytes Relative 19 %   Lymphs Abs 1.5 0.7 - 4.0 K/uL   Monocytes Relative 7 %   Monocytes Absolute  0.5 0.1 - 1.0 K/uL   Eosinophils Relative 5 %   Eosinophils Absolute 0.4 0.0 - 0.5 K/uL   Basophils Relative 1 %   Basophils Absolute 0.0 0.0 - 0.1 K/uL   Immature Granulocytes 1 %   Abs Immature Granulocytes 0.05 0.00 - 0.07 K/uL    Comment: Performed at [facility], [address], [place], [place] [address]  Comprehensive metabolic panel     Status: Abnormal   Collection Time: 04/29/19  7:38 PM  Result Value Ref Range   Sodium 143 135 - 145 mmol/L   Potassium 5.7 (H) 3.5 - 5.1 mmol/L   Chloride 103 98 - 111 mmol/L   CO2 15 (L) 22 - 32 mmol/L   Glucose, Bld 211 (H) 70 - 99 mg/dL   BUN 91 (H) 8 - 23 mg/dL   Creatinine, Ser 15.20 (H) 0.61 - 1.24 mg/dL   Calcium 8.5 (L) 8.9 - 10.3 mg/dL   Total Protein 7.1 6.5 - 8.1 g/dL   Albumin 3.1 (L) 3.5 - 5.0 g/dL   AST 443 (H) 15 - 41 U/L   ALT 147 (H) 0 - 44 U/L   Alkaline Phosphatase 168 (H) 38 - 126 U/L   Total Bilirubin 0.4 0.3 - 1.2 mg/dL   GFR calc non Af Amer 3 (L) >60 mL/min   GFR calc Af Amer 3 (L) >60 mL/min   Anion gap 25 (H) 5 - 15    Comment: Performed at [facility], [place] [address], [place], [place] [address]  Magnesium     Status: None   Collection Time: 04/29/19  7:38 PM  Result Value Ref Range   Magnesium 2.2 1.7 - 2.4 mg/dL    Comment: Performed at [facility], [place] [address], [place], [place] [address]  Troponin I (High Sensitivity)     Status: Abnormal   Collection Time: 04/29/19  7:38 PM  Result Value Ref Range   Troponin I (High Sensitivity) 169 (HH) <18 ng/L    Comment: CRITICAL RESULT CALLED TO, READ BACK BY AND VERIFIED WITH: Lonald Troiani P,RN 04/29/19 2204 WAYK Performed at [facility], [place] [address] [place], [place] [address]   I-stat chem 8, ED (not at [facility] or [facility])     Status:  Abnormal   Collection Time: 04/29/19  7:50 PM  Result Value Ref Range   Sodium 143 135 - 145 mmol/L   Potassium 6.0 (H) 3.5 - 5.1 mmol/L   Chloride 110 98 - 111 mmol/L   BUN 89 (H) 8 - 23 mg/dL    Creatinine, Ser 15.30 (H) 0.61 - 1.24 mg/dL   Glucose, Bld 196 (H) 70 - 99 mg/dL   Calcium, Ion 1.05 (L) 1.15 - 1.40 mmol/L   TCO2 17 (L) 22 - 32 mmol/L   Hemoglobin 12.6 (L) 13.0 - 17.0 g/dL   HCT 37.0 (L) 39.0 - 52.0 %  Troponin I (High Sensitivity)     Status: Abnormal   Collection Time: 04/29/19 10:20 PM  Result Value Ref Range   Troponin I (High Sensitivity) 4,723 (HH) <18 ng/L    Comment: CRITICAL RESULT CALLED TO, READ BACK BY AND VERIFIED WITH: MUNNETT [facility] 04/29/19 2313 WAYK Performed at [facility], [place] [address], [place], [place] [address]   CBG monitoring, ED     Status: Abnormal   Collection Time: 04/29/19 11:12 PM  Result Value Ref Range   Glucose-Capillary 129 (H) 70 - 99 mg/dL   Ct Head Wo Contrast  Result Date: 04/29/2019 CLINICAL DATA:  Seizure, new, nontraumatic, >40 yrs EXAM: CT HEAD WITHOUT CONTRAST TECHNIQUE: Contiguous axial images were obtained from the base of the skull through the vertex without intravenous contrast. COMPARISON:  Head CT 02/08/2018 FINDINGS: Brain: No intracranial hemorrhage, mass effect, or midline shift. Generalized atrophy. Unchanged chronic small vessel ischemia, moderate for age. No hydrocephalus. The basilar cisterns are patent. No evidence of territorial infarct or acute ischemia. No extra-axial or intracranial fluid collection. Vascular: Atherosclerosis of skullbase vasculature without hyperdense vessel or abnormal calcification. Skull: No fracture or focal lesion. Sinuses/Orbits: Paranasal sinuses and mastoid air cells are clear. The visualized orbits are unremarkable. Left cataract resection. Other: None. IMPRESSION: 1. No acute intracranial abnormality. 2. Unchanged atrophy and chronic small vessel ischemia. Electronically Signed   By: Keith Rake M.D.   On: 04/29/2019 22:33   Ct Angio Chest Pe W And/or Wo Contrast  Result Date: 04/30/2019 CLINICAL DATA:  PE suspected, high pretest prob Patient found unresponsive in bathroom,  missed last 3 dialysis appointments. EXAM: CT ANGIOGRAPHY CHEST WITH CONTRAST TECHNIQUE: Multidetector CT imaging of the chest was performed using the standard protocol during bolus administration of intravenous contrast. Multiplanar CT image reconstructions and MIPs were obtained to evaluate the vascular anatomy. CONTRAST:  106mL OMNIPAQUE IOHEXOL 350 MG/ML SOLN COMPARISON:  Radiograph yesterday. No prior chest CT. Lung bases from abdominal CT 02/08/2018 FINDINGS: Cardiovascular: There are no filling defects within the pulmonary arteries to suggest pulmonary embolus. Aortic atherosclerosis without aneurysm or dissection. Coronary artery calcifications. Mild cardiomegaly. No pericardial effusion. Mediastinum/Nodes: Enlarged lower paratracheal node at 14 mm, series 5, image 36. Enlarged right hilar node measuring 2.6 cm, image 50. Multiple additional mildly enlarged and prominent bilateral hilar and mediastinal nodes. Decompressed esophagus. Small hiatal hernia. High-density material in the distal esophagus and proximal stomach. No visualized thyroid nodule. Lungs/Pleura: Fine reticular and ground-glass opacities present in both lungs, right greater than left and more prominent at the lung bases. Similar reticular opacities seen on prior abdominal CT, however currently increased from that exam. Clustered calcified pulmonary nodules in the right lung. Mild irregular fissural thickening. No septal thickening or pleural fluid. Central bronchial thickening, trachea and mainstem bronchi are patent. Upper Abdomen: Small hiatal hernia. No acute findings. Musculoskeletal: There are no  acute or suspicious osseous abnormalities. Review of the MIP images confirms the above findings. IMPRESSION: 1. No pulmonary embolus. 2. Fine reticular and ground-glass opacities in both lungs, right greater than left and more prominent at the lung bases. Similar reticular opacities seen on 2019 abdominal CT, however currently increased from  that exam. Suspect pulmonary edema or acute infection superimposed on chronic interstitial lung disease. Consider nonemergent high resolution chest CT for characterization. 3. Mild mediastinal and right hilar adenopathy is likely reactive, but nonspecific. 4. Aortic Atherosclerosis (ICD10-I70.0). Coronary artery calcifications. 5. Small hiatal hernia. Electronically Signed   By: Keith Rake M.D.   On: 04/30/2019 00:25   Dg Chest Portable 1 View  Result Date: 04/29/2019 CLINICAL DATA:  Dyspnea. Syncope. Found unresponsive in bathroom. Missed dialysis. EXAM: PORTABLE CHEST 1 VIEW COMPARISON:  Radiograph 12/10/2018 FINDINGS: Cardiomegaly is unchanged. Unchanged mediastinal contours. Fine reticular opacities throughout both lungs which are more prominent than on prior exam. Small right and possibly left pleural effusions. No confluent airspace disease. No pneumothorax. No acute osseous abnormalities. IMPRESSION: Cardiomegaly with pulmonary edema and small right and possibly left pleural effusions. Electronically Signed   By: Keith Rake M.D.   On: 04/29/2019 20:25    Pending Labs Unresulted Labs (From admission, onward)    Start     Ordered   04/30/19 [id number]  Basic metabolic panel  Tomorrow morning,   R     04/30/19 0049   04/30/19 0500  CBC  Tomorrow morning,   R     04/30/19 0049   04/30/19 0050  CBC  (heparin)  Once,   STAT    Comments: Baseline for heparin therapy IF NOT ALREADY DRAWN.  Notify MD if PLT < 100 K.    04/30/19 0049   04/30/19 0050  Creatinine, serum  (heparin)  Once,   STAT    Comments: Baseline for heparin therapy IF NOT ALREADY DRAWN.    04/30/19 0049   04/30/19 0050  Magnesium  Once,   STAT     04/30/19 0049   04/30/19 0050  Phosphorus  Once,   STAT     04/30/19 0049   04/30/19 0050  TSH  Once,   STAT     04/30/19 0049   04/30/19 0050  Renal function panel  Once,   STAT    Comments: Please call MD with result    04/30/19 0049   04/29/19 1921  CBC with  Differential  Once,   STAT     04/29/19 1921          Vitals/Pain Today's Vitals   04/30/19 0015 04/30/19 0017 04/30/19 0030 04/30/19 0045  BP: (!) 157/65  (!) 155/73 (!) 144/65  Pulse: 75  73 72  Resp: 19  16 13   Temp:      TempSrc:      SpO2: 99%  100% 99%  Weight:      Height:      PainSc:  0-No pain      Isolation Precautions No active isolations  Medications Medications  sodium zirconium cyclosilicate (LOKELMA) packet 10 g (10 g Oral Given 04/29/19 2337)  aspirin EC tablet 81 mg (has no administration in time range)  atorvastatin (LIPITOR) tablet 20 mg (has no administration in time range)  Calcium Acetate TABS 667 mg (has no administration in time range)  pantoprazole (PROTONIX) EC tablet 40 mg (has no administration in time range)  Nephro Vitamins TABS 0.8 mg (has no administration in time range)  sodium bicarbonate  tablet 1,300 mg (has no administration in time range)  heparin injection 5,000 Units (has no administration in time range)  insulin aspart (novoLOG) injection 0-5 Units (has no administration in time range)  insulin aspart (novoLOG) injection 0-9 Units (has no administration in time range)  calcium gluconate 1 g/ 50 mL sodium chloride IVPB (0 g Intravenous Stopped 04/29/19 2030)  iohexol (OMNIPAQUE) 350 MG/ML injection 75 mL (75 mLs Intravenous Contrast Given 04/29/19 2355)    Mobility walks with device High fall risk   Focused Assessments Renal Assessment Handoff:  Hemodialysis Schedule: Hemodialysis Schedule: Tuesday/Thursday/Saturday Last Hemodialysis date and time: 04/24/2019   Restricted appendage: right arm     R Recommendations: See Admitting Provider Note  Report given to:   Additional Notes:

## 2019-04-30 NOTE — Progress Notes (Addendum)
ANTICOAGULATION CONSULT NOTE  Pharmacy Consult for Heparin Indication: chest pain/ACS  No Known Allergies  Patient Measurements: Height: 5\' 8"  (172.7 cm) Weight: 190 lb 0.6 oz (86.2 kg) IBW/kg (Calculated) : 68.4  Vital Signs: Temp: 98.3 F (36.8 C) (08/17 1720) BP: 142/74 (08/17 1800) Pulse Rate: 64 (08/17 1900)  Labs: Recent Labs    04/29/19 1938 04/29/19 1950 04/29/19 2220 04/30/19 0233 04/30/19 1015  HGB 11.4* 12.6*  --  10.9*  --   HCT 37.5* 37.0*  --  34.8*  --   PLT 180  --   --  180  --   HEPARINUNFRC  --   --   --   --  1.09*  CREATININE 15.20* 15.30*  --  15.37*  --   TROPONINIHS 169*  --  4,723* 18,435*  --     Estimated Creatinine Clearance: 4.7 mL/min (A) (by C-G formula based on SCr of 15.37 mg/dL (H)).  Assessment: 73 y.o. male presented to the ED s/p syncope continues on IV heparin.  Initial Heparin level was high and heparin was held then reduced.  This PM, Heparin infusion was held for tunneled HD catheter placement and not resumed.  Discussed with Dr. Donzetta Matters, VVS who confirmed that it was ok to resume Heparin.  Dr. Carolin Sicks aware patient on IV Heparin. Patient is starting CRRT.  Will not bolus, just resume and recheck level.   Goal of Therapy:  Heparin level 0.3-0.7 units/ml Monitor platelets by anticoagulation protocol: Yes   Plan:  Restart heparin gtt at 900 units/hr Check an 6 hr heparin level - note patient will likely be on  heparin with CRRT as well.  Daily heparin level and CBC  Sloan Leiter, PharmD, BCPS, BCCCP Clinical Pharmacist Please refer to [facility] for [facility] numbers 04/30/2019,7:35 PM

## 2019-04-30 NOTE — ED Notes (Signed)
To Ed room 37.  Denies any current pain.  IV patent and infusing Heparin at 1000 units /hour.  Side rails up x 2, call light within reach.   Pt denies any needs.  Pharmacy called to verify Heparin infusion.

## 2019-04-30 NOTE — Progress Notes (Signed)
Echocardiogram 2D Echocardiogram has been performed.  Blake [name] 04/30/2019, 10:08 AM

## 2019-04-30 NOTE — Anesthesia Preprocedure Evaluation (Addendum)
Anesthesia Evaluation  Patient identified by MRN, date of birth, ID band Patient awake    Reviewed: Allergy & Precautions, NPO status , Patient's Chart, lab work & pertinent test results  Airway Mallampati: II  TM Distance: >3 FB     Dental   Pulmonary former smoker,    breath sounds clear to auscultation       Cardiovascular hypertension, + Past MI   Rhythm:Regular Rate:Normal     Neuro/Psych    GI/Hepatic Neg liver ROS, GERD  ,  Endo/Other  diabetes  Renal/GU Renal disease     Musculoskeletal   Abdominal   Peds  Hematology   Anesthesia Other Findings   Reproductive/Obstetrics                             Anesthesia Physical Anesthesia Plan  ASA: IV  Anesthesia Plan: MAC   Post-op Pain Management:    Induction: Intravenous  PONV Risk Score and Plan: 1 and Ondansetron, Treatment may vary due to age or medical condition and Midazolam  Airway Management Planned: Nasal Cannula and Simple Face Mask  Additional Equipment:   Intra-op Plan:   Post-operative Plan:   Informed Consent: I have reviewed the patients History and Physical, chart, labs and discussed the procedure including the risks, benefits and alternatives for the proposed anesthesia with the patient or authorized representative who has indicated his/her understanding and acceptance.     Dental advisory given  Plan Discussed with: CRNA and Anesthesiologist  Anesthesia Plan Comments:         Anesthesia Quick Evaluation

## 2019-04-30 NOTE — Progress Notes (Signed)
ANTICOAGULATION CONSULT NOTE  Pharmacy Consult for Heparin Indication: chest pain/ACS  No Known Allergies  Patient Measurements: Height: 5\' 8"  (172.7 cm) Weight: 190 lb 0.6 oz (86.2 kg) IBW/kg (Calculated) : 68.4  Vital Signs: BP: 164/74 (08/17 1100) Pulse Rate: 70 (08/17 0945)  Labs: Recent Labs    04/29/19 1938 04/29/19 1950 04/29/19 2220 04/30/19 0233 04/30/19 1015  HGB 11.4* 12.6*  --  10.9*  --   HCT 37.5* 37.0*  --  34.8*  --   PLT 180  --   --  180  --   HEPARINUNFRC  --   --   --   --  1.09*  CREATININE 15.20* 15.30*  --  15.37*  --   TROPONINIHS 169*  --  4,723* 18,435*  --     Estimated Creatinine Clearance: 4.7 mL/min (A) (by C-G formula based on SCr of 15.37 mg/dL (H)).  Assessment: 73 y.o. male presented to the ED s/p syncope continues on IV heparin. Initial heparin level is elevated. Confirmed with RN that it was drawn appropriately. Pt did receive a SQ dose of heparin prior to getting and IV bolus with the drip. This may be impacting this initial level. Hgb is low and platelets are WNL. No bleeding noted. Planning dialysis catheter insertion later today with cardiac cath tomorrow.    Goal of Therapy:  Heparin level 0.3-0.7 units/ml Monitor platelets by anticoagulation protocol: Yes   Plan:  Hold heparin x 30 minutes Restart heparin gtt at 900 units/hr Check an 8 hr heparin level Daily heparin level and CBC  Rosabella Edgin, Rande Lawman 04/30/2019,11:33 AM

## 2019-04-30 NOTE — Progress Notes (Signed)
ANTICOAGULATION CONSULT NOTE - Initial Consult  Pharmacy Consult for Heparin Indication: chest pain/ACS  No Known Allergies  Patient Measurements: Height: 5\' 8"  (172.7 cm) Weight: 190 lb 0.6 oz (86.2 kg) IBW/kg (Calculated) : 68.4  Vital Signs: Temp: 98.6 F (37 C) (08/16 1952) Temp Source: Oral (08/16 1952) BP: 152/72 (08/17 0245) Pulse Rate: 72 (08/17 0045)  Labs: Recent Labs    04/29/19 1938 04/29/19 1950 04/29/19 2220  HGB 11.4* 12.6*  --   HCT 37.5* 37.0*  --   PLT 180  --   --   CREATININE 15.20* 15.30*  --   TROPONINIHS 169*  --  4,723*    Estimated Creatinine Clearance: 4.7 mL/min (A) (by C-G formula based on SCr of 15.3 mg/dL (H)).   Medical History: Past Medical History:  Diagnosis Date  . Acute osteomyelitis of toe of right foot ([place]) 08/02/2015  . Arthritis    "maybe RUE/hand" (02/08/2018)  . ESRD (end stage renal disease) on dialysis [facility])    "TTS; [place]" (02/08/2018)  . Hyperlipidemia   . Hypertension   . Type II diabetes mellitus ([name])     Medications:  No current facility-administered medications on file prior to encounter.    Current Outpatient Medications on File Prior to Encounter  Medication Sig Dispense Refill  . aspirin EC 81 MG tablet Take 81 mg by mouth daily.    Marland Kitchen atorvastatin (LIPITOR) 20 MG tablet Take 20 mg by mouth at bedtime.     . B Complex-C-Folic Acid (NEPHRO VITAMINS) 0.8 MG TABS Take 0.8 mg by mouth daily.     . Calcium Acetate 667 MG TABS Take 667 mg by mouth 3 (three) times daily with meals.     . cinacalcet (SENSIPAR) 30 MG tablet Take 30 mg by mouth See admin instructions. Take one tablet (30 mg) by mouth on Tuesday and Saturday with supper    . HYDROcodone-acetaminophen (NORCO) 5-325 MG tablet Take 1 tablet by mouth every 6 (six) hours as needed for moderate pain. 15 tablet 0  . insulin regular (NOVOLIN R RELION) 100 units/mL injection Inject 0-0.09 mLs (0-9 Units total) into the skin 3 (three) times daily with  meals. CBG < 70: implement hypoglycemia protocol CBG 70 - 120: 0 units CBG 121 - 150: 1 unit CBG 151 - 200: 2 units CBG 201 - 250: 3 units CBG 251 - 300: 5 units CBG 301 - 350: 7 units CBG 351 - 400: 9 units CBG > 400: call MD.    . ondansetron (ZOFRAN) 4 MG tablet Take 1 tablet (4 mg total) by mouth every 6 (six) hours. (Patient taking differently: Take 4 mg by mouth every 8 (eight) hours as needed for nausea. ) 20 tablet 0  . pantoprazole (PROTONIX) 40 MG tablet Take 40 mg by mouth 2 (two) times daily.     Marland Kitchen PARoxetine (PAXIL) 10 MG tablet Take 10 mg by mouth daily.       Assessment: 73 y.o. male with elevated cardiac markers for heparin Goal of Therapy:  Heparin level 0.3-0.7 units/ml Monitor platelets by anticoagulation protocol: Yes   Plan:  Heparin 4000 units IV bolus, then start heparin 1000 units/hr Check heparin level in 8 hours.   Caryl Pina 04/30/2019,3:02 AM

## 2019-04-30 NOTE — ED Notes (Signed)
Pharmacy reviewing meds at this time.

## 2019-04-30 NOTE — ED Notes (Signed)
MD finished with AM rounds.  OJ and applesauce offered to patient

## 2019-04-30 NOTE — ED Notes (Signed)
Pt request oxygen.   SA02 on RA 92%, increased to 94 on 2L.

## 2019-04-30 NOTE — ED Notes (Signed)
Report called to SDS.   Reviewed pt heaprin regimen and doses thus far.  Will leave Heparin off at this time.  BG 70 and transported to the holding area.

## 2019-04-30 NOTE — Consult Note (Addendum)
[facility] Renal Consultation Note  Indication for Consultation:  Management of ESRD/hemodialysis; anemia, hypertension/volume and secondary hyperparathyroidism  HPI: Blake [name] is a 73 y.o. male with ESRD 2/2 DM,R grt toe amp 2/2 osteom 07/2015,  Chronic [facility] 04/24/19 after hd 2/2  increasing size of AVF aneurysmal ulcers  and risk of bleed to AVF. Dr Trula Slade revision surgery done on 04/24/19 with possible room  For cannulation ,no show  Thursday 8/13 HD and Saturday 8/15 reported Clotted AVF(reported @ OP HD [facility] IR "does not would not declott on weekends") sent home with [facility] rx and  had Syncopal episode and came to ER yesterday . K 6.0 med rx to 5.6  /cxr = Pulmonary edema .Not in resp distress when seen in ER ,no reported CP, fevers , denies any trauma with fall and did not report fall to [place] on our history .  We are consulted for HD issues and will consult VVS for [facility] cath / access issues . Plan for HD today  After access and tomorrow normal TTS time         Past Medical History:  Diagnosis Date  . Acute osteomyelitis of toe of right foot ([place]) 08/02/2015  . Arthritis    "maybe RUE/hand" (02/08/2018)  . ESRD (end stage renal disease) on dialysis [facility])    "TTS; [place]" (02/08/2018)  . Hyperlipidemia   . Hypertension   . Type II diabetes mellitus ([place])     Past Surgical History:  Procedure Laterality Date  . AMPUTATION TOE Right 08/04/2015   Procedure: RIGHT GREAT TOE AMPUTATION;  Surgeon: [name], MD;  Location: [place];  Service: Orthopedics;  Laterality: Right;  . AV FISTULA PLACEMENT Right 07/05/2014   Procedure: ARTERIOVENOUS BRACHIALCEPHALIC (AV) FISTULA CREATION;  Surgeon: Conrad Hope, MD;  Location: [place];  Service: Vascular;  Laterality: Right;  . AV FISTULA PLACEMENT Right 04/24/2019   Procedure: REVISION RIGHT ARM ARTERIOVENOUS (AV) FISTULA;  Surgeon: Serafina Mitchell, MD;  Location: [place];  Service:  Vascular;  Laterality: Right;  . BACK SURGERY    . ESOPHAGOGASTRODUODENOSCOPY (EGD) WITH PROPOFOL N/A 11/28/2015   Procedure: ESOPHAGOGASTRODUODENOSCOPY (EGD) WITH PROPOFOL;  Surgeon: [name], MD;  Location[facility];  Service: Endoscopy;  Laterality: N/A;  . INSERTION OF DIALYSIS CATHETER Left 07/05/2014   Procedure: INSERTION OF DIALYSIS CATHETER-LEFT INTERNAL JUGULAR PLACEMENT;  Surgeon: Conrad [place], MD;  Location: [place];  Service: Vascular;  Laterality: Left;  . LUMBAR DISC SURGERY    . REMOVAL OF A DIALYSIS CATHETER Right 07/05/2014   Procedure: REMOVAL OF A DIALYSIS CATHETER;  Surgeon: Conrad Pastura, MD;  Location: [place];  Service: Vascular;  Laterality: Right;      Family History  Problem Relation Age of Onset  . Diabetes Father   . Heart attack Father       reports that he quit smoking about 39 years ago. His smoking use included cigarettes. He has a 2.50 pack-year smoking history. He has never used smokeless tobacco. He reports previous alcohol use. He reports that he does not use drugs.  No Known Allergies  Prior to Admission medications   Medication Sig Start Date End Date Taking? Authorizing Provider  aspirin EC 81 MG tablet Take 81 mg by mouth daily.   Yes [provider]  atorvastatin (LIPITOR) 20 MG tablet Take 20 mg by mouth at bedtime.    Yes [provider]  HYDROcodone-acetaminophen (  NORCO) 5-325 MG tablet Take 1 tablet by mouth every 6 (six) hours as needed for moderate pain. 04/24/19  Yes Dagoberto Ligas, PA-C  insulin regular (NOVOLIN R RELION) 100 units/mL injection Inject 0-0.09 mLs (0-9 Units total) into the skin 3 (three) times daily with meals. CBG < 70: implement hypoglycemia protocol CBG 70 - 120: 0 units CBG 121 - 150: 1 unit CBG 151 - 200: 2 units CBG 201 - 250: 3 units CBG 251 - 300: 5 units CBG 301 - 350: 7 units CBG 351 - 400: 9 units CBG > 400: call MD. Patient taking differently: Inject 0-14 Units into the skin 3  (three) times daily with meals. CBG < 70: implement hypoglycemia protocol CBG 70 - 120: 0 units CBG 121 - 150: 4 unit CBG 151 - 200: 6 units CBG 201 - 250: 8 units CBG 251 - 300: 10 units CBG 301 - 350: 12 units CBG 351 - 400: 14 units CBG > 400: call MD. 02/13/18  Yes Hongalgi, Lenis Dickinson, MD  ondansetron (ZOFRAN) 4 MG tablet Take 1 tablet (4 mg total) by mouth every 6 (six) hours. Patient taking differently: Take 4 mg by mouth every 8 (eight) hours as needed for nausea.  12/22/18  Yes Pollina, Gwenyth Allegra, MD  pantoprazole (PROTONIX) 40 MG tablet Take 40 mg by mouth 2 (two) times daily.    Yes [provider]  PARoxetine (PAXIL) 10 MG tablet Take 10 mg by mouth daily.   Yes [provider]  B Complex-C-Folic Acid (NEPHRO VITAMINS) 0.8 MG TABS Take 0.8 mg by mouth daily.     [provider]  Calcium Acetate 667 MG TABS Take 667 mg by mouth 3 (three) times daily with meals.  02/14/18   [provider]  LOKELMA 10 g PACK packet Take 10 g by mouth daily. 04-28-19 04/28/19   [provider]     Anti-infectives (From admission, onward)   None      Results for orders placed or performed during the hospital encounter of 04/29/19 (from the past 48 hour(s))  SARS Coronavirus 2 [facility] order, Performed in [facility] hospital lab) Nasopharyngeal Nasopharyngeal Swab     Status: None   Collection Time: 04/29/19  7:22 PM   Specimen: Nasopharyngeal Swab  Result Value Ref Range   SARS Coronavirus 2 NEGATIVE NEGATIVE    Comment: (NOTE) If result is NEGATIVE SARS-CoV-2 target nucleic acids are NOT DETECTED. The SARS-CoV-2 RNA is generally detectable in upper and lower  respiratory specimens during the acute phase of infection. The lowest  concentration of SARS-CoV-2 viral copies this assay can detect is 250  copies / mL. A negative result does not preclude SARS-CoV-2 infection  and should not be used as the sole basis for treatment or other  patient  management decisions.  A negative result may occur with  improper specimen collection / handling, submission of specimen other  than nasopharyngeal swab, presence of viral mutation(s) within the  areas targeted by this assay, and inadequate number of viral copies  (<250 copies / mL). A negative result must be combined with clinical  observations, patient history, and epidemiological information. If result is POSITIVE SARS-CoV-2 target nucleic acids are DETECTED. The SARS-CoV-2 RNA is generally detectable in upper and lower  respiratory specimens dur ing the acute phase of infection.  Positive  results are indicative of active infection with SARS-CoV-2.  Clinical  correlation with patient history and other diagnostic information is  necessary to determine patient  infection status.  Positive results do  not rule out bacterial infection or co-infection with other viruses. If result is PRESUMPTIVE POSTIVE SARS-CoV-2 nucleic acids MAY BE PRESENT.   A presumptive positive result was obtained on the submitted specimen  and confirmed on repeat testing.  While 2019 novel coronavirus  (SARS-CoV-2) nucleic acids may be present in the submitted sample  additional confirmatory testing may be necessary for epidemiological  and / or clinical management purposes  to differentiate between  SARS-CoV-2 and other Sarbecovirus currently known to infect humans.  If clinically indicated additional testing with an alternate test  methodology [phone]) is advised. The SARS-CoV-2 RNA is generally  detectable in upper and lower respiratory sp ecimens during the acute  phase of infection. The expected result is Negative. Fact Sheet for Patients:  StrictlyIdeas.no Fact Sheet for Healthcare Providers: BankingDealers.co.za This test is not yet approved or cleared by the [place] FDA and has been authorized for detection and/or diagnosis of SARS-CoV-2 by FDA under  an Emergency Use Authorization (EUA).  This EUA will remain in effect (meaning this test can be used) for the duration of the COVID-19 declaration under Section 564(b)(1) of the Act, 21 U.S.C. section 360bbb-3(b)(1), unless the authorization is terminated or revoked sooner. Performed at [facility], [place] [address], [place], [place] [address]   CBG monitoring, ED     Status: Abnormal   Collection Time: 04/29/19  7:37 PM  Result Value Ref Range   Glucose-Capillary 191 (H) 70 - 99 mg/dL  CBC with Differential/Platelet     Status: Abnormal   Collection Time: 04/29/19  7:38 PM  Result Value Ref Range   WBC 7.7 4.0 - 10.5 K/uL   RBC 4.00 (L) 4.22 - 5.81 MIL/uL   Hemoglobin 11.4 (L) 13.0 - 17.0 g/dL   HCT 37.5 (L) 39.0 - 52.0 %   MCV 93.8 80.0 - 100.0 fL   MCH 28.5 26.0 - 34.0 pg   MCHC 30.4 30.0 - 36.0 g/dL   RDW 14.6 11.5 - 15.5 %   Platelets 180 150 - 400 K/uL   nRBC 0.0 0.0 - 0.2 %   Neutrophils Relative % 67 %   Neutro Abs 5.3 1.7 - 7.7 K/uL   Lymphocytes Relative 19 %   Lymphs Abs 1.5 0.7 - 4.0 K/uL   Monocytes Relative 7 %   Monocytes Absolute 0.5 0.1 - 1.0 K/uL   Eosinophils Relative 5 %   Eosinophils Absolute 0.4 0.0 - 0.5 K/uL   Basophils Relative 1 %   Basophils Absolute 0.0 0.0 - 0.1 K/uL   Immature Granulocytes 1 %   Abs Immature Granulocytes 0.05 0.00 - 0.07 K/uL    Comment: Performed at [facility], [address] [place],  [address]  Comprehensive metabolic panel     Status: Abnormal   Collection Time: 04/29/19  7:38 PM  Result Value Ref Range   Sodium 143 135 - 145 mmol/L   Potassium 5.7 (H) 3.5 - 5.1 mmol/L   Chloride 103 98 - 111 mmol/L   CO2 15 (L) 22 - 32 mmol/L   Glucose, Bld 211 (H) 70 - 99 mg/dL   BUN 91 (H) 8 - 23 mg/dL   Creatinine, Ser 15.20 (H) 0.61 - 1.24 mg/dL   Calcium 8.5 (L) 8.9 - 10.3 mg/dL   Total Protein 7.1 6.5 - 8.1 g/dL   Albumin 3.1 (L) 3.5 - 5.0 g/dL   AST 443 (H) 15 - 41 U/L  ALT 147 (H) 0 - 44 U/L    Alkaline Phosphatase 168 (H) 38 - 126 U/L   Total Bilirubin 0.4 0.3 - 1.2 mg/dL   GFR calc non Af Amer 3 (L) >60 mL/min   GFR calc Af Amer 3 (L) >60 mL/min   Anion gap 25 (H) 5 - 15    Comment: Performed at [place] [address], [place], [place] [address]  Magnesium     Status: None   Collection Time: 04/29/19  7:38 PM  Result Value Ref Range   Magnesium 2.2 1.7 - 2.4 mg/dL    Comment: Performed at [facility], [place] [address] [place], [place] [address]  Troponin I (High Sensitivity)     Status: Abnormal   Collection Time: 04/29/19  7:38 PM  Result Value Ref Range   Troponin I (High Sensitivity) 169 (HH) <18 ng/L    Comment: CRITICAL RESULT CALLED TO, READ BACK BY AND VERIFIED WITH: JOHNSTON P,RN 04/29/19 2204 WAYK Performed at [facility], [place] [address], [place], [place] [address]   I-stat chem 8, ED (not at [facility] or [facility])     Status: Abnormal   Collection Time: 04/29/19  7:50 PM  Result Value Ref Range   Sodium 143 135 - 145 mmol/L   Potassium 6.0 (H) 3.5 - 5.1 mmol/L   Chloride 110 98 - 111 mmol/L   BUN 89 (H) 8 - 23 mg/dL   Creatinine, Ser 15.30 (H) 0.61 - 1.24 mg/dL   Glucose, Bld 196 (H) 70 - 99 mg/dL   Calcium, Ion 1.05 (L) 1.15 - 1.40 mmol/L   TCO2 17 (L) 22 - 32 mmol/L   Hemoglobin 12.6 (L) 13.0 - 17.0 g/dL   HCT 37.0 (L) 39.0 - 52.0 %  Troponin I (High Sensitivity)     Status: Abnormal   Collection Time: 04/29/19 10:20 PM  Result Value Ref Range   Troponin I (High Sensitivity) 4,723 (HH) <18 ng/L    Comment: CRITICAL RESULT CALLED TO, READ BACK BY AND VERIFIED WITH: MUNNETT [facility] 04/29/19 2313 WAYK Performed at [facility], [address] [place] [address] [place], [place] [address]   CBG monitoring, ED     Status: Abnormal   Collection Time: 04/29/19 11:12 PM  Result Value Ref Range   Glucose-Capillary 129 (H) 70 - 99 mg/dL  CBG monitoring, ED     Status: Abnormal   Collection Time: 04/30/19  1:57 AM  Result Value Ref Range   Glucose-Capillary  112 (H) 70 - 99 mg/dL  CBC     Status: Abnormal   Collection Time: 04/30/19  2:33 AM  Result Value Ref Range   WBC 8.6 4.0 - 10.5 K/uL   RBC 3.81 (L) 4.22 - 5.81 MIL/uL   Hemoglobin 10.9 (L) 13.0 - 17.0 g/dL   HCT 34.8 (L) 39.0 - 52.0 %   MCV 91.3 80.0 - 100.0 fL   MCH 28.6 26.0 - 34.0 pg   MCHC 31.3 30.0 - 36.0 g/dL   RDW 14.7 11.5 - 15.5 %   Platelets 180 150 - 400 K/uL   nRBC 0.0 0.0 - 0.2 %    Comment: Performed at [facility], [place]. [address] [place], [place] [address]  Magnesium     Status: None   Collection Time: 04/30/19  2:33 AM  Result Value Ref Range   Magnesium 2.2 1.7 - 2.4 mg/dL    Comment: Performed at [facility], [place]  [address] [place], [place] [address]  TSH     Status: None   Collection Time: 04/30/19  2:33 AM  Result Value Ref Range   TSH 0.436 0.350 - 4.500 uIU/mL    Comment: Performed by a 3rd Generation assay with a functional sensitivity of <=0.01 uIU/mL. Performed at [facility], [place] [address] [place], [place] [address]   Troponin I (High Sensitivity)     Status: Abnormal   Collection Time: 04/30/19  2:33 AM  Result Value Ref Range   Troponin I (High Sensitivity) 18,435 (HH) <18 ng/L    Comment: CRITICAL RESULT CALLED TO, READ BACK BY AND VERIFIED WITH: MUNNETT [facility] 04/30/19 0351 WAYK Performed at [facility], [place] [address] [place], [place] [address]   Renal function panel     Status: Abnormal   Collection Time: 04/30/19  2:33 AM  Result Value Ref Range   Sodium 144 135 - 145 mmol/L   Potassium 5.6 (H) 3.5 - 5.1 mmol/L   Chloride 107 98 - 111 mmol/L   CO2 19 (L) 22 - 32 mmol/L   Glucose, Bld 125 (H) 70 - 99 mg/dL   BUN 93 (H) 8 - 23 mg/dL   Creatinine, Ser 15.37 (H) 0.61 - 1.24 mg/dL   Calcium 8.5 (L) 8.9 - 10.3 mg/dL   Phosphorus 8.6 (H) 2.5 - 4.6 mg/dL   Albumin 3.0 (L) 3.5 - 5.0 g/dL   GFR calc non Af Amer 3 (L) >60 mL/min   GFR calc Af Amer 3 (L) >60 mL/min   Anion gap 18 (H) 5 - 15    Comment: Performed  at [place] [address] [place], [place] [address]  CBG monitoring, ED     Status: Abnormal   Collection Time: 04/30/19  8:18 AM  Result Value Ref Range   Glucose-Capillary 67 (L) 70 - 99 mg/dL  Heparin level (unfractionated)     Status: Abnormal   Collection Time: 04/30/19 10:15 AM  Result Value Ref Range   Heparin Unfractionated 1.09 (H) 0.30 - 0.70 IU/mL    Comment: (NOTE) If heparin results are below expected values, and patient dosage has  been confirmed, suggest follow up testing of antithrombin III levels. Performed at [facility], [place] [address] [place], [place] [address]     ROS: see hpi   Physical Exam: Vitals:   04/30/19 1045 04/30/19 1100  BP: (!) 152/96 (!) 164/74  Pulse:    Resp: 15 (!) 21  Temp:    SpO2:       General: alert calm NAD chronically ill male in ER  HEENT: [place] , not icteric, MMM, poor dental hygiene  Neck: supple , no jvd  Heart: RRR, no m,r,g Lungs: Non labored breathing with bilat basilar fine rales  Abdomen:obese, soft , NT, ND Extremities: no pedal edema , R grt toe amp site dry /healed Skin: no overt rash , no pedal ulcers   Neuro: alert OX3, moves all extrem  Dialysis Access:  R UA AVF pos bruit/thrilll  /stable surgical site at recent repair site   [facility]  4h  83kg kg (last 2 txs below edw )  2K/3.5CA  Hep 6000 RFA AVF     Hec 2 mcg IV/HD Venofer 50mg  q wkly   No ESA     Assessment/Plan 1. Pulmonary  Edema/ vol overload 2/2 Malfunctioning  HD acces = consultring VVS  For [facility] cath and HD today and tomor.  2. ESRD -   K 6_>5.6  After meds ,  Hd today and tomor on schedule   fu labs  3. Hypertension/volume  - as above vol ^ , Bp stable ,no op meds 4. Syncope = wu per admit  5. Elevated Troponin in setting missed HD /Volume overload in ESRD pt  Card consult per admit  6. Anemia  - hgb  No OP esa , weekly Fe on HD  7. Metabolic bone disease -  Hectorol  On hd and binders 8. DM Type 2 per  admit   Ernest Haber, PA-C [place] [phone] 04/30/2019, 11:14 AM   Pt seen, examined and agree w A/P as above.  Kelly Splinter  MD 04/30/2019, 2:07 PM

## 2019-04-30 NOTE — Consult Note (Signed)
Hospital Consult    Reason for Consult: Needs dialysis access Referring Physician: Dr. Jonnie Finner MRN #:  [id number]  History of Present Illness: This is a 73 y.o. male here with fluid overload found to have non-ST elevation myocardial infarction.  Recent underwent intervention of right arm AV fistula.  Now has not been able to dialyze.  Needs urgent dialysis access.  Previous left IJ tunnel catheter does not know of any right sided catheters in the past.  Does not have any ports or pacemakers.  Past Medical History:  Diagnosis Date  . Acute osteomyelitis of toe of right foot ([place]) 08/02/2015  . Arthritis    "maybe RUE/hand" (02/08/2018)  . ESRD (end stage renal disease) on dialysis [facility])    "TTS; [place]" (02/08/2018)  . Hyperlipidemia   . Hypertension   . Type II diabetes mellitus ([place])     Past Surgical History:  Procedure Laterality Date  . AMPUTATION TOE Right 08/04/2015   Procedure: RIGHT GREAT TOE AMPUTATION;  Surgeon: [name], MD;  Location: [place];  Service: Orthopedics;  Laterality: Right;  . AV FISTULA PLACEMENT Right 07/05/2014   Procedure: ARTERIOVENOUS BRACHIALCEPHALIC (AV) FISTULA CREATION;  Surgeon: Conrad Catawba, MD;  Location: [place];  Service: Vascular;  Laterality: Right;  . AV FISTULA PLACEMENT Right 04/24/2019   Procedure: REVISION RIGHT ARM ARTERIOVENOUS (AV) FISTULA;  Surgeon: Serafina Mitchell, MD;  Location: [place];  Service: Vascular;  Laterality: Right;  . BACK SURGERY    . ESOPHAGOGASTRODUODENOSCOPY (EGD) WITH PROPOFOL N/A 11/28/2015   Procedure: ESOPHAGOGASTRODUODENOSCOPY (EGD) WITH PROPOFOL;  Surgeon: [name], MD;  [facility];  Service: Endoscopy;  Laterality: N/A;  . INSERTION OF DIALYSIS CATHETER Left 07/05/2014   Procedure: INSERTION OF DIALYSIS CATHETER-LEFT INTERNAL JUGULAR PLACEMENT;  Surgeon: Conrad Pecatonica, MD;  Location: [place];  Service: Vascular;  Laterality: Left;  . LUMBAR DISC SURGERY    . REMOVAL OF A DIALYSIS CATHETER  Right 07/05/2014   Procedure: REMOVAL OF A DIALYSIS CATHETER;  Surgeon: Conrad Sabetha, MD;  Location: [facility];  Service: Vascular;  Laterality: Right;    No Known Allergies  Prior to Admission medications   Medication Sig Start Date End Date Taking? Authorizing Provider  aspirin EC 81 MG tablet Take 81 mg by mouth daily.    [provider]  atorvastatin (LIPITOR) 20 MG tablet Take 20 mg by mouth at bedtime.     [provider]  B Complex-C-Folic Acid (NEPHRO VITAMINS) 0.8 MG TABS Take 0.8 mg by mouth daily.     [provider]  Calcium Acetate 667 MG TABS Take 667 mg by mouth 3 (three) times daily with meals.  02/14/18   [provider]  cinacalcet (SENSIPAR) 30 MG tablet Take 30 mg by mouth See admin instructions. Take one tablet (30 mg) by mouth on Tuesday and Saturday with supper 11/23/18   [provider]  HYDROcodone-acetaminophen (NORCO) 5-325 MG tablet Take 1 tablet by mouth every 6 (six) hours as needed for moderate pain. 04/24/19   Dagoberto Ligas, PA-C  insulin regular (NOVOLIN R RELION) 100 units/mL injection Inject 0-0.09 mLs (0-9 Units total) into the skin 3 (three) times daily with meals. CBG < 70: implement hypoglycemia protocol CBG 70 - 120: 0 units CBG 121 - 150: 1 unit CBG 151 - 200: 2 units CBG 201 - 250: 3 units CBG 251 - 300: 5 units CBG 301 - 350: 7 units CBG 351 - 400: 9 units CBG >  400: call MD. 02/13/18   [name], MD  LOKELMA 10 g PACK packet Take 10 g by mouth daily. 04-28-19 04/28/19   [provider]  ondansetron (ZOFRAN) 4 MG tablet Take 1 tablet (4 mg total) by mouth every 6 (six) hours. Patient taking differently: Take 4 mg by mouth every 8 (eight) hours as needed for nausea.  12/22/18   Orpah Greek, MD  pantoprazole (PROTONIX) 40 MG tablet Take 40 mg by mouth 2 (two) times daily.     [provider]  PARoxetine (PAXIL) 10 MG tablet Take 10 mg by mouth daily.    [provider]     Social History   Socioeconomic History  . Marital status: Single    Spouse name: Not on file  . Number of children: Not on file  . Years of education: Not on file  . Highest education level: Not on file  Occupational History  . Not on file  Social Needs  . Financial resource strain: Not on file  . Food insecurity    Worry: Not on file    Inability: Not on file  . Transportation needs    Medical: Not on file    Non-medical: Not on file  Tobacco Use  . Smoking status: Former Smoker    Packs/day: 0.50    Years: 5.00    Pack years: 2.50    Types: Cigarettes    Quit date: 08/24/1979    Years since quitting: 39.7  . Smokeless tobacco: Never Used  Substance and Sexual Activity  . Alcohol use: Not Currently    Alcohol/week: 0.0 standard drinks    Comment: 02/08/2018 "stopped in my 50's"  . Drug use: Never  . Sexual activity: Not Currently  Lifestyle  . Physical activity    Days per week: Not on file    Minutes per session: Not on file  . Stress: Not on file  Relationships  . Social Herbalist on phone: Not on file    Gets together: Not on file    Attends religious service: Not on file    Active member of club or organization: Not on file    Attends meetings of clubs or organizations: Not on file    Relationship status: Not on file  . Intimate partner violence    Fear of current or ex partner: Not on file    Emotionally abused: Not on file    Physically abused: Not on file    Forced sexual activity: Not on file  Other Topics Concern  . Not on file  Social History Narrative  . Not on file     Family History  Problem Relation Age of Onset  . Diabetes Father   . Heart attack Father     ROS: Cardiovascular: []  chest pain/pressure []  palpitations []  SOB lying flat []  DOE []  pain in legs while walking []  pain in legs at rest []  pain in legs at night []  non-healing ulcers []  hx of DVT []  swelling in legs  Pulmonary: []  productive cough []   asthma/wheezing []  home O2  Neurologic: []  weakness in []  arms []  legs []  numbness in []  arms []  legs []  hx of CVA []  mini stroke [] difficulty speaking or slurred speech []  temporary loss of vision in one eye []  dizziness  Hematologic: []  hx of cancer []  bleeding problems []  problems with blood clotting easily  Endocrine:   []  diabetes []  thyroid disease  GI []  vomiting blood []   blood in stool  GU: []  CKD/renal failure []  HD--[]  M/W/F or []  T/T/S []  burning with urination []  blood in urine  Psychiatric: []  anxiety []  depression  Musculoskeletal: []  arthritis []  joint pain  Integumentary: []  rashes []  ulcers  Constitutional: []  fever []  chills   Physical Examination  Vitals:   04/30/19 0945 04/30/19 1000  BP: (!) 162/75 (!) 157/75  Pulse: 70   Resp: 18 15  Temp:    SpO2: 100%    Body mass index is 28.89 kg/m.  General: No acute distress HENT: WNL, normocephalic Pulmonary: Nonlabored respirations at rest Cardiac: Palpable bilateral radial artery pulses Abdomen:  soft, NT/ND, no masses Extremities: Right upper extremity incision healing well Neurologic: A&O X 3; Appropriate Affect ; SENSATION: normal; MOTOR FUNCTION:  moving all extremities equally. Speech is fluent/normal  CBC    Component Value Date/Time   WBC 8.6 04/30/2019 0233   RBC 3.81 (L) 04/30/2019 0233   HGB 10.9 (L) 04/30/2019 0233   HCT 34.8 (L) 04/30/2019 0233   PLT 180 04/30/2019 0233   MCV 91.3 04/30/2019 0233   MCH 28.6 04/30/2019 0233   MCHC 31.3 04/30/2019 0233   RDW 14.7 04/30/2019 0233   LYMPHSABS 1.5 04/29/2019 1938   MONOABS 0.5 04/29/2019 1938   EOSABS 0.4 04/29/2019 1938   BASOSABS 0.0 04/29/2019 1938    BMET    Component Value Date/Time   NA 144 04/30/2019 0233   K 5.6 (H) 04/30/2019 0233   CL 107 04/30/2019 0233   CO2 19 (L) 04/30/2019 0233   GLUCOSE 125 (H) 04/30/2019 0233   BUN 93 (H) 04/30/2019 0233   CREATININE 15.37 (H) 04/30/2019 0233   CALCIUM  8.5 (L) 04/30/2019 0233   GFRNONAA 3 (L) 04/30/2019 0233   GFRAA 3 (L) 04/30/2019 0233    COAGS: Lab Results  Component Value Date   INR 1.47 02/07/2018   INR 1.33 07/02/2014     ASSESSMENT/PLAN: This is a 73 y.o. male recently underwent right arm revision of AV fistula for ulceration.  Now here with fluid overload and non-ST elevation MI in need of dialysis access.  We will plan for tunneled dialysis catheter today.  Had applesauce and orange juice at 830 per bedside nurse.  Brandon C. Donzetta Matters, MD [facility] Office: [phone] Pager: [phone]

## 2019-04-30 NOTE — Consult Note (Addendum)
NAME:  Blake [name], MRN:  [id number], DOB:  1946-03-18, LOS: 1 ADMISSION DATE:  04/29/2019, CONSULTATION DATE:  04/30/2019 REFERRING MD:  TRH, CHIEF COMPLAINT:  Syncope/ hypotension  Brief History   73 yoM ESRD admitted after syncopal episode and several missed dialysis sessions due to malfunctioning graft.  Cardiology seeing for NSTEMI, on heparin with plans for heart catheterrization after dialysis.  Went for placement of R TDC without complication and had acute change in mental status, hypotension, and brief agonal breathing without loss of pulses in PACU.  S/p narcan x 2.  Improvement in hemodynamics but some residual lethargy.  To be tranferred to ICU for CRRT overnight, PCCM consulted.   History of present illness   73 year old male with history of ESRD on iHD TTS, DM, HTN, HLD, osteomyelitis of right great toe s/p amputation initially presenting 8/16 with altered mental status and syncopal episode at home after missing several dialysis sessions due to vascular access problems, reported last iHD 04/24/2019, with episodes of vomiting.  Had revision of right arm AVF for ulceration on 72/11 by Dr. Trula Slade.  Initial labs noed for BUN 91, sCr 15.3, hs-trop 169, Hgb 11.4.  Initial EKG showed a right bundle branch block with ST depression V1 to V3 but changes resolved quickly.  CT head without acute findings. CTA PE negative for PE.  CXR howed some pulmonary edema and small effusions.  In ER, his mental status returned to baseline and hemodynamically stable and afebrile.  He was admitted to hospitalist service with cardiology and nephrology consulting.  He was started on IV heparin with plans for vascular to place tunneled dialysis catheter with dialysis and then cardiac catheterrization  in the morning on 8/18.      Patient had R TDC placed in IR without complications.  Reportedly received fentanyl 50 mcg and propofol for procedure and was at baseline but had sudden altered mental status with  hypotensive epidsode with systolic in the 0000000 and developed agonal breathing.  He never lost pulses, bagged briefly and given narcan x 2 with mental status and hemodynamic improvement.  Repeat pending labs, EKG still showing R BBB, and glucose 129.  PCCM consulted.  Nephrology wants to transfer to ICU for CRRT overnight given prior hemodynamic instability and close monitoring.   Past Medical History  ESRD on iHD TTS, DM, HTN, HLD, osteomyelitis of right great toe s/p amputation  Significant Hospital Events   8/16 Admit 8/17 tx ICU   Consults:  Nephrology Cardiology  [facility] PCCM  Procedures:  8/17 R [facility] >>  Significant Diagnostic Tests:  8/16 CTH  >> neg  8/17 CTA PE >> 1. No pulmonary embolus. 2. Fine reticular and ground-glass opacities in both lungs, right greater than left and more prominent at the lung bases. Similar reticular opacities seen on 2019 abdominal CT, however currently increased from that exam. Suspect pulmonary edema or acute infection superimposed on chronic interstitial lung disease. Consider nonemergent high resolution chest CT for characterization. 3. Mild mediastinal and right hilar adenopathy is likely reactive, but nonspecific. 4. Aortic Atherosclerosis (ICD10-I70.0). Coronary artery calcifications. 5. Small hiatal hernia.  8/17 TTE  >>  1. The left ventricle has normal systolic function with an ejection fraction of 60-65%. The cavity size was normal. Left ventricular diastolic Doppler parameters are consistent with pseudonormalization. No evidence of left ventricular regional wall motion abnormalities.  2. The right ventricle has normal systolic function. The cavity was normal. There is no increase in right ventricular wall  thickness.  3. The aortic valve is tricuspid. Moderate thickening of the aortic valve. Mild calcification of the aortic valve.  4. The aorta is normal unless otherwise noted.  5. The inferior vena cava was normal in size with <50%  respiratory variability.  Micro Data:  8/16 SARS CoV-2 >> negative  Antimicrobials:  8/17 cefazolin preop  Interim history/subjective:  Currently denies any pain, chest pain, SOB, or N/V  Objective   Blood pressure (!) 142/74, pulse 64, temperature 98.3 F (36.8 C), resp. rate 14, height 5\' 8"  (1.727 m), weight 86.2 kg, SpO2 100 %.        Intake/Output Summary (Last 24 hours) at 04/30/2019 1923 Last data filed at 04/30/2019 1531 Gross per 24 hour  Intake 250 ml  Output 5 ml  Net 245 ml   Filed Weights   04/29/19 1930  Weight: 86.2 kg    Examination: General:  Well nourished adult male lying in bed in NAD HEENT: MM pale/moist, pupils 3/reactive, acinetic,   Neuro: Mildly lethargic, awakens to verbal, oriented x 3, MAE, no focal deficits CV: SR, no murmur, dry dressing to R TDC PULM:  Regular, non labored, on [place] 4 L at 100% GI: soft, bs active, NT/ ND Extremities: cool/dry, no LE edema, RUE AVF- site without warmth/ erythema, incision healing well, prior right great amputation Skin: no rashes   Resolved Hospital Problem list    Assessment & Plan:   Acute encephalopathy  Syncope - admit CTH negative P:  Currently no focal deficits Monitor neuro and tele, ? Component of cardiac component, no clear source of infection  Hypotension now resolved- ? Related narcotics vs possible syncopal epidsode - no arrhythmias reported  - TTE 8/17 with no wall motion abnormalities and EF 60-65 P:  Currently hemodynamically stable Tele monitoring Goal MAP > 65 Send blood cultures- no reports of fever, leukocytosis, monitor clinically for now  ESRD on iHD s/p R [facility] 8/17 - last HD weight 83 kg (reporteldy below EDW) P:  Per Nephrology Plan to start CRRT given prior hemodynamic instability Pending BMP- K 4.8, no acute changes   NSTEMI P:  Per cards TTE as above  Resume heparin gtt ASA daily / atorvastatin daily  Tele monitoring  repeat EKG Hs-troponing now downtrending  (169->  4723-> 18435-> 8140) Plan for LHC in am or sooner if problems arise   Hypoxia - CT showing reticular and GGO bilaterally, additionally seen on CT in 2019 - questionable due to pulmonary edema vs infection vs ILD - CTA PE neg for PE P: Wean supplemental O2 for sats  >92% VBG and CXR now CRRT for volume removal  Consider follow-up imaging in several weeks  DMT2 P:  CBG q 4 SSI q 4  Best practice:  Diet: NPO Pain/Anxiety/Delirium protocol (if indicated): n/a VAP protocol (if indicated): n/a DVT prophylaxis: heparin IV GI prophylaxis: n/a Glucose control: SSI q 4hr Mobility: BR Code Status: full  Family Communication: patient updated on plan of care.  Lives at home with his niece, [name]. Disposition: ICU  Labs   CBC: Recent Labs  Lab 04/24/19 1336 04/24/19 1353 04/29/19 1938 04/29/19 1950 04/30/19 0233  WBC  --   --  7.7  --  8.6  NEUTROABS  --   --  5.3  --   --   HGB 14.6 16.0 11.4* 12.6* 10.9*  HCT 43.0 47.0 37.5* 37.0* 34.8*  MCV  --   --  93.8  --  91.3  PLT  --   --  180  --  [id number]    Basic Metabolic Panel: Recent Labs  Lab 04/24/19 1336 04/24/19 1353 04/29/19 1938 04/29/19 1950 04/30/19 0233  NA 135 140 143 143 144  K 8.5* 4.8 5.7* 6.0* 5.6*  CL  --   --  103 110 107  CO2  --   --  15*  --  19*  GLUCOSE 106* 103* 211* 196* 125*  BUN  --   --  91* 89* 93*  CREATININE  --   --  15.20* 15.30* 15.37*  CALCIUM  --   --  8.5*  --  8.5*  MG  --   --  2.2  --  2.2  PHOS  --   --   --   --  8.6*   GFR: Estimated Creatinine Clearance: 4.7 mL/min (A) (by C-G formula based on SCr of 15.37 mg/dL (H)). Recent Labs  Lab 04/29/19 1938 04/30/19 0233  WBC 7.7 8.6    Liver Function Tests: Recent Labs  Lab 04/29/19 1938 04/30/19 0233  AST 443*  --   ALT 147*  --   ALKPHOS 168*  --   BILITOT 0.4  --   PROT 7.1  --   ALBUMIN 3.1* 3.0*   No results for input(s): LIPASE, AMYLASE in the last 168 hours. No results for input(s): AMMONIA in the last  168 hours.  ABG    Component Value Date/Time   PHART 7.366 07/03/2014 0500   PCO2ART 42.9 07/03/2014 0500   PO2ART 62.5 (L) 07/03/2014 0500   HCO3 14.9 (L) 02/07/2018 2205   TCO2 17 (L) 04/29/2019 1950   ACIDBASEDEF 11.0 (H) 02/07/2018 2205   O2SAT 75.0 02/07/2018 2205     Coagulation Profile: No results for input(s): INR, PROTIME in the last 168 hours.  Cardiac Enzymes: No results for input(s): CKTOTAL, CKMB, CKMBINDEX, TROPONINI in the last 168 hours.  HbA1C: Hemoglobin A1C  Date/Time Value Ref Range Status  02/17/2018 7.6  Final   Hgb A1c MFr Bld  Date/Time Value Ref Range Status  12/09/2018 03:37 AM 6.5 (H) 4.8 - 5.6 % Final    Comment:    (NOTE) Pre diabetes:          5.7%-6.4% Diabetes:              >6.4% Glycemic control for   <7.0% adults with diabetes   07/02/2014 02:45 AM 6.0 (H) <5.7 % Final    Comment:    (NOTE)                                                                       According to the ADA Clinical Practice Recommendations for 2011, when HbA1c is used as a screening test:  >=6.5%   Diagnostic of Diabetes Mellitus           (if abnormal result is confirmed) 5.7-6.4%   Increased risk of developing Diabetes Mellitus References:Diagnosis and Classification of Diabetes Mellitus,Diabetes [id number] 1):S62-S69 and Standards of Medical Care in         Diabetes - 2011,Diabetes Care,2011,34 (Suppl 1):S11-S61.    CBG: Recent Labs  Lab 04/30/19 1349 04/30/19 1445 04/30/19 1508 04/30/19 1603 04/30/19 1802  GLUCAP 70 56* 116* 112* 129*    Review of  Systems:   Review of Systems  Constitutional: Negative for chills and fever.  Eyes: Negative for double vision.  Respiratory: Negative for cough, sputum production, shortness of breath and wheezing.   Cardiovascular: Negative for chest pain, palpitations, orthopnea and leg swelling.  Gastrointestinal: Negative for nausea and vomiting.  Neurological: Negative for speech change, focal  weakness and headaches.   Past Medical History  He,  has a past medical history of Acute osteomyelitis of toe of right foot ([place]) (08/02/2015), Arthritis, ESRD (end stage renal disease) on dialysis ([place]), Hyperlipidemia, Hypertension, and Type II diabetes mellitus ([place]).   Surgical History    Past Surgical History:  Procedure Laterality Date  . AMPUTATION TOE Right 08/04/2015   Procedure: RIGHT GREAT TOE AMPUTATION;  Surgeon: [name], MD;  Location: [place];  Service: Orthopedics;  Laterality: Right;  . AV FISTULA PLACEMENT Right 07/05/2014   Procedure: ARTERIOVENOUS BRACHIALCEPHALIC (AV) FISTULA CREATION;  Surgeon: Conrad [place]els[place], MD;  Location: [place];  Service: Vascular;  Laterality: Right;  . AV FISTULA PLACEMENT Right 04/24/2019   Procedure: REVISION RIGHT ARM ARTERIOVENOUS (AV) FISTULA;  Surgeon: Serafina Mitchell, MD;  Location: [place];  Service: Vascular;  Laterality: Right;  . BACK SURGERY    . ESOPHAGOGASTRODUODENOSCOPY (EGD) WITH PROPOFOL N/A 11/28/2015   Procedure: ESOPHAGOGASTRODUODENOSCOPY (EGD) WITH PROPOFOL;  Surgeon: [name], MD;  [facility];  Service: Endoscopy;  Laterality: N/A;  . INSERTION OF DIALYSIS CATHETER Left 07/05/2014   Procedure: INSERTION OF DIALYSIS CATHETER-LEFT INTERNAL JUGULAR PLACEMENT;  Surgeon: Conrad [place]land, MD;  Location: [place];  Service: Vascular;  Laterality: Left;  . LUMBAR DISC SURGERY    . REMOVAL OF A DIALYSIS CATHETER Right 07/05/2014   Procedure: REMOVAL OF A DIALYSIS CATHETER;  Surgeon: Conrad [place] Ranchos, MD;  Location: [place];  Service: Vascular;  Laterality: Right;     Social History   reports that he quit smoking about 39 years ago. His smoking use included cigarettes. He has a 2.50 pack-year smoking history. He has never used smokeless tobacco. He reports previous alcohol use. He reports that he does not use drugs.   Family History   His family history includes Diabetes in his father; Heart attack in his father.   Allergies  No Known Allergies   Home Medications  Prior to Admission medications   Medication Sig Start Date End Date Taking? Authorizing Provider  aspirin EC 81 MG tablet Take 81 mg by mouth daily.   Yes [provider]  atorvastatin (LIPITOR) 20 MG tablet Take 20 mg by mouth at bedtime.    Yes [provider]  HYDROcodone-acetaminophen (NORCO) 5-325 MG tablet Take 1 tablet by mouth every 6 (six) hours as needed for moderate pain. 04/24/19  Yes Dagoberto Ligas, PA-C  insulin regular (NOVOLIN R RELION) 100 units/mL injection Inject 0-0.09 mLs (0-9 Units total) into the skin 3 (three) times daily with meals. CBG < 70: implement hypoglycemia protocol CBG 70 - 120: 0 units CBG 121 - 150: 1 unit CBG 151 - 200: 2 units CBG 201 - 250: 3 units CBG 251 - 300: 5 units CBG 301 - 350: 7 units CBG 351 - 400: 9 units CBG > 400: call MD. Patient taking differently: Inject 0-14 Units into the skin 3 (three) times daily with meals. CBG < 70: implement hypoglycemia protocol CBG 70 - 120: 0 units CBG 121 - 150: 4 unit CBG 151 - 200: 6 units CBG 201 - 250: 8 units CBG 251 -  300: 10 units CBG 301 - 350: 12 units CBG 351 - 400: 14 units CBG > 400: call MD. 02/13/18  Yes Hongalgi, Lenis Dickinson, MD  ondansetron (ZOFRAN) 4 MG tablet Take 1 tablet (4 mg total) by mouth every 6 (six) hours. Patient taking differently: Take 4 mg by mouth every 8 (eight) hours as needed for nausea.  12/22/18  Yes Pollina, Gwenyth Allegra, MD  pantoprazole (PROTONIX) 40 MG tablet Take 40 mg by mouth 2 (two) times daily.    Yes [provider]  PARoxetine (PAXIL) 10 MG tablet Take 10 mg by mouth daily.   Yes [provider]  B Complex-C-Folic Acid (NEPHRO VITAMINS) 0.8 MG TABS Take 0.8 mg by mouth daily.     [provider]  Calcium Acetate 667 MG TABS Take 667 mg by mouth 3 (three) times daily with meals.  02/14/18   [provider]  LOKELMA 10 g PACK packet Take 10 g by mouth daily. 04-28-19  04/28/19   [provider]     Critical care time: 49 mins    Kennieth Rad, MSN, AGACNP-BC  Pulmonary & Critical Care Pgr: [phone] or if no answer [phone] 04/30/2019, 8:14 PM   Attestation:  73 yo M with a history of ESRD, DM who presented initially 8/16 after having missed HD (last 8/11), found to have NSTEMI (troponin peak 18,000), who had an acute episode of unresponsiveness with hypotension after getting a right tunneled dialysis catheter, transferred to the CVICU.    S: Patient denies any chest pain, or shortness of breath.  No significant complaints now.   O: Vitals: BP now 131/70.  HR 60. RR 12, Satting 100% on 6L.   General: lethargic, but easily arousable to voice.  Answering questions appropriately HEENT:  Pupils equal, round reactive.  Oropharynx normal CV: distant heart sounds, no murmurs. RRR Resp: Minimal crackles bilaterally, good breath sounds in all lung fields GI: soft, nondistended, nontender MSK: no edema bilaterally Neuro: lethargic.  Moves all extremities, follows all commands. No focal deficits noted.   A/P: 73 yo M with a history of ESRD (missed HD, last 8/11), DM, and admitted for NSTEMI who had acute unresponsive episode on the floor with hypotension and agonal breathing, ddx includes acute arrhythmia or vagal episode.  Seizure and overdose due to narcotics are possibilities but less likely.  No signs of acute CVA.  Sepsis less likely due to the acute nature of the event and resolution.     # Acute encephalopathy # Hypotension Both improved, now normotensive.  Arrhythmia high on ddx. BMP in normal limits except for lower bicarb (16) - monitor closely- would work up for sepsis (?lung source) if fevers or becomes hypotensive again.  - blood gas - CXR - repeat CBC  # ESRD # Uremia - CRRT per nephrology since this recent hemodynamic instability  # NSTEMI Echo normal this admission, and troponin (after this episode) has trended down  from prior - heparin gtt - cardiology on board - repeat EKG  # Acute hypoxic respiratory failure # Interstitial lung findings Bilateral reticular opacities on CTA of chest on admission, pulmonary edema (most likely) vs pneumonia on top of underlying fibrosis or emphysema.  If fevers or has persistent hypotension, will start abx.  Ok to hold abx for now.  Currently on 5L but satting 100% - wean O2 - CRRT with volume removal   Critical care time: 65 minutes

## 2019-04-30 NOTE — Progress Notes (Signed)
Discussed plan for IJ tunneled dialysis catheter for emergent dialysis.  Previous LIJ catheter in review of notes. Risks and benefits discussed.  Marty Heck, MD [facility] Office: [phone] Pager: [place]

## 2019-04-30 NOTE — ED Notes (Signed)
Patient transported to CT 

## 2019-04-30 NOTE — ED Notes (Addendum)
Admitting doctor paged the pt received 5,000 heparin sq at 0238  Order for iv heparin  Bolus and a drip  The sq dose was cancelled  At 0305 after the sq dose was administered  Pharmacy contacted  For verification.  The pharmacist verified to give the iv bolus and drip

## 2019-04-30 NOTE — Code Documentation (Addendum)
CODE BLUE NOTE  Patient Name: Blake [name]   MRN: [id number]   Date of Birth/ Sex: 08-02-1946 , male      Admission Date: 04/29/2019  Attending Provider: Antonieta Pert, MD  Primary Diagnosis: <principal problem not specified>    Indication: Pt was in his usual state of health until this PM, when he was noted to be obtunded. Code blue was subsequently called. At the time of arrival on scene, ACLS protocol was underway.    Technical Description:  - CPR performance duration:  0 mins  - Was defibrillation or cardioversion used? No  - Was external pacer placed?   No  - Was patient intubated pre/post CPR?   No    Medications Administered: Y = Yes; Blank = No Amiodarone    Atropine    Calcium    Epinephrine    Lidocaine    Magnesium    Norepinephrine    Phenylephrine    Sodium bicarbonate    Vasopressin     NARCAN 0.4mg /ml inj. x2  Post CPR evaluation:  - Final Status - Was patient successfully resuscitated ? Yes - What is current rhythm?  Sinus rhythm - What is current hemodynamic status?  Stable   Miscellaneous Information:  - Labs sent, including:  CBG  - Primary team notified?    Yes  - Family Notified?   None present}  - Additional notes/ transfer status:  In a progressive unit will stay unless otherwise indicated by internal medicine doctor        Autry-Lott, Naaman Plummer, DO  04/30/2019, 6:18 PM

## 2019-04-30 NOTE — Significant Event (Signed)
Rapid Response Event Note  Overview:  Called by bedside RN d/t pt unresponsive on arrival from PACU. PT just received HD cath and per PACU RN had been sitting up and drinking diet coke with her for the last 2 hours.     Initial Focused Assessment: On arrival, pt not responsive, diaphoretic, agonal breathing, BP 64/43, HR 61. Bedside RN called code blue. Pt did not lose pulse. Unable to pick up spO2 on monitor.   After receiving narcan x2 he slowly began to wake although still very lethargic.   Interventions: 0.4 Narcan given x2  CCM consult  Hospitalist notified and at bedside Renal and Cards made aware.   Plan of Care (if not transferred): Pt transferred to [facility], will plan for CRRT.   Event Summary:  called at  1755   event ended at  [place]

## 2019-04-30 NOTE — Progress Notes (Signed)
Code called for episode of unresponsiveness. Bp was in 65/45. Nonloss of pulse. S/p Narcanx2. He woke up but still.lethargic. post procedure in pacu was Aaox3.Came and examined. Awake,lethargic, knows he is in [place],able to move all extremities. Stat ekg glucose done. Bmp, trop sent. He is short of breath, on 6 l [place] saturating at 100%.Discussed with Dr Domenic Polite, Dr Carolin Sicks and [facility] for ICU transfer and CRRT. Unclear if heart  block during the episode. Hopefully HD will help. pccm requested Narcan drip and ordered icu transfer under pccm service. Discussed with patient.

## 2019-05-01 ENCOUNTER — Encounter (HOSPITAL_COMMUNITY): Payer: Self-pay | Admitting: Vascular Surgery

## 2019-05-01 ENCOUNTER — Inpatient Hospital Stay (HOSPITAL_COMMUNITY)
Admission: EM | Disposition: A | Discharge status: Home Health | Payer: Medicare Other | Source: Home / Self Care | Attending: Internal Medicine

## 2019-05-01 DIAGNOSIS — N186 End stage renal disease: Secondary | ICD-10-CM

## 2019-05-01 DIAGNOSIS — I10 Essential (primary) hypertension: Secondary | ICD-10-CM

## 2019-05-01 DIAGNOSIS — G9340 Encephalopathy, unspecified: Secondary | ICD-10-CM

## 2019-05-01 DIAGNOSIS — E1122 Type 2 diabetes mellitus with diabetic chronic kidney disease: Secondary | ICD-10-CM

## 2019-05-01 DIAGNOSIS — I214 Non-ST elevation (NSTEMI) myocardial infarction: Principal | ICD-10-CM

## 2019-05-01 DIAGNOSIS — I2510 Atherosclerotic heart disease of native coronary artery without angina pectoris: Secondary | ICD-10-CM

## 2019-05-01 HISTORY — PX: LEFT HEART CATH AND CORONARY ANGIOGRAPHY: CATH118249

## 2019-05-01 HISTORY — PX: CORONARY STENT INTERVENTION: CATH118234

## 2019-05-01 LAB — RENAL FUNCTION PANEL
Albumin: 3.2 g/dL — ABNORMAL LOW (ref 3.5–5.0)
Albumin: 3 g/dL — ABNORMAL LOW (ref 3.5–5.0)
Anion gap: 18 — ABNORMAL HIGH (ref 5–15)
Anion gap: 13 (ref 5–15)
BUN: 78 mg/dL — ABNORMAL HIGH (ref 8–23)
BUN: 46 mg/dL — ABNORMAL HIGH (ref 8–23)
CO2: 18 mmol/L — ABNORMAL LOW (ref 22–32)
CO2: 21 mmol/L — ABNORMAL LOW (ref 22–32)
Calcium: 8.4 mg/dL — ABNORMAL LOW (ref 8.9–10.3)
Calcium: 8.1 mg/dL — ABNORMAL LOW (ref 8.9–10.3)
Chloride: 107 mmol/L (ref 98–111)
Chloride: 104 mmol/L (ref 98–111)
Creatinine, Ser: 12.93 mg/dL — ABNORMAL HIGH (ref 0.61–1.24)
Creatinine, Ser: 8.17 mg/dL — ABNORMAL HIGH (ref 0.61–1.24)
GFR calc Af Amer: 4 mL/min — ABNORMAL LOW (ref >60)
GFR calc Af Amer: 7 mL/min — ABNORMAL LOW (ref >60)
GFR calc non Af Amer: 3 mL/min — ABNORMAL LOW (ref >60)
GFR calc non Af Amer: 6 mL/min — ABNORMAL LOW (ref >60)
Glucose, Bld: 107 mg/dL — ABNORMAL HIGH (ref 70–99)
Glucose, Bld: 103 mg/dL — ABNORMAL HIGH (ref 70–99)
Phosphorus: 6.8 mg/dL — ABNORMAL HIGH (ref 2.5–4.6)
Phosphorus: 4.4 mg/dL (ref 2.5–4.6)
Potassium: 5 mmol/L (ref 3.5–5.1)
Potassium: 4.8 mmol/L (ref 3.5–5.1)
Sodium: 143 mmol/L (ref 135–145)
Sodium: 138 mmol/L (ref 135–145)

## 2019-05-01 LAB — POCT ACTIVATED CLOTTING TIME
Activated Clotting Time: 510 seconds
Activated Clotting Time: 692 seconds
Activated Clotting Time: 153 seconds
Activated Clotting Time: >1000 seconds

## 2019-05-01 LAB — GLUCOSE, CAPILLARY
Glucose-Capillary: 91 mg/dL (ref 70–99)
Glucose-Capillary: 76 mg/dL (ref 70–99)
Glucose-Capillary: 72 mg/dL (ref 70–99)
Glucose-Capillary: 81 mg/dL (ref 70–99)
Glucose-Capillary: 108 mg/dL — ABNORMAL HIGH (ref 70–99)
Glucose-Capillary: 151 mg/dL — ABNORMAL HIGH (ref 70–99)

## 2019-05-01 LAB — MAGNESIUM: Magnesium: 2.2 mg/dL (ref 1.7–2.4)

## 2019-05-01 LAB — CBC
HCT: 36.6 % — ABNORMAL LOW (ref 39.0–52.0)
Hemoglobin: 11.4 g/dL — ABNORMAL LOW (ref 13.0–17.0)
MCH: 28.4 pg (ref 26.0–34.0)
MCHC: 31.1 g/dL (ref 30.0–36.0)
MCV: 91.3 fL (ref 80.0–100.0)
Platelets: 161 10*3/uL (ref 150–400)
RBC: 4.01 MIL/uL — ABNORMAL LOW (ref 4.22–5.81)
RDW: 14.8 % (ref 11.5–15.5)
WBC: 8.4 10*3/uL (ref 4.0–10.5)
nRBC: 0 % (ref 0.0–0.2)

## 2019-05-01 LAB — APTT: aPTT: 75 seconds — ABNORMAL HIGH (ref 24–36)

## 2019-05-01 LAB — TROPONIN I (HIGH SENSITIVITY): Troponin I (High Sensitivity): 4035 ng/L (ref <18)

## 2019-05-01 LAB — HEPARIN LEVEL (UNFRACTIONATED): Heparin Unfractionated: 0.29 IU/mL — ABNORMAL LOW (ref 0.30–0.70)

## 2019-05-01 SURGERY — LEFT HEART CATH AND CORONARY ANGIOGRAPHY
Anesthesia: LOCAL

## 2019-05-01 MED ORDER — MIDAZOLAM HCL 2 MG/2ML IJ SOLN
INTRAMUSCULAR | Status: AC
  Filled 2019-05-01: qty 2

## 2019-05-01 MED ORDER — SODIUM CHLORIDE 0.9% FLUSH: 3.0000 mL | INTRAVENOUS | Status: DC | PRN

## 2019-05-01 MED ORDER — IOHEXOL 350 MG/ML SOLN
INTRAVENOUS | Status: DC | PRN
  Administered 2019-05-01: 155 mL via INTRAVENOUS

## 2019-05-01 MED ORDER — ASPIRIN EC 81 MG PO TBEC
81.0000 mg | DELAYED_RELEASE_TABLET | Freq: Every day | ORAL | Status: DC
  Administered 2019-05-01 – 2019-05-05 (×5): 81 mg via ORAL
  Filled 2019-05-01 (×5): qty 1

## 2019-05-01 MED ORDER — DEXTROSE 50 % IV SOLN
12.5000 g | INTRAVENOUS | Status: AC
  Administered 2019-05-01: 12.5 g via INTRAVENOUS

## 2019-05-01 MED ORDER — BIVALIRUDIN BOLUS VIA INFUSION - CUPID
INTRAVENOUS | Status: DC | PRN
  Administered 2019-05-01: 64.05 mg via INTRAVENOUS

## 2019-05-01 MED ORDER — INSULIN ASPART 100 UNIT/ML ~~LOC~~ SOLN
0.0000 [IU] | Freq: Three times a day (TID) | SUBCUTANEOUS | Status: DC
  Administered 2019-05-02 – 2019-05-04 (×4): 2 [IU] via SUBCUTANEOUS
  Administered 2019-05-04: 3 [IU] via SUBCUTANEOUS
  Administered 2019-05-05 (×2): 1 [IU] via SUBCUTANEOUS

## 2019-05-01 MED ORDER — SODIUM CHLORIDE 0.9% FLUSH
3.0000 mL | Freq: Two times a day (BID) | INTRAVENOUS | Status: DC
  Administered 2019-05-01 – 2019-05-05 (×9): 3 mL via INTRAVENOUS

## 2019-05-01 MED ORDER — LIDOCAINE HCL (PF) 1 % IJ SOLN
INTRAMUSCULAR | Status: AC
  Filled 2019-05-01: qty 30

## 2019-05-01 MED ORDER — HEPARIN (PORCINE) IN NACL 1000-0.9 UT/500ML-% IV SOLN
INTRAVENOUS | Status: AC
  Filled 2019-05-01: qty 1000

## 2019-05-01 MED ORDER — CALCIUM ACETATE (PHOS BINDER) 667 MG PO CAPS
1334.0000 mg | ORAL_CAPSULE | Freq: Three times a day (TID) | ORAL | Status: DC
  Administered 2019-05-01 – 2019-05-05 (×10): 1334 mg via ORAL
  Filled 2019-05-01 (×10): qty 2

## 2019-05-01 MED ORDER — SODIUM CHLORIDE 0.9 % IV SOLN
INTRAVENOUS | Status: DC | PRN
  Administered 2019-05-01: 0.25 mg/kg/h via INTRAVENOUS

## 2019-05-01 MED ORDER — SODIUM CHLORIDE 0.9% FLUSH
3.0000 mL | Freq: Two times a day (BID) | INTRAVENOUS | Status: DC
  Administered 2019-05-01 – 2019-05-05 (×9): 3 mL via INTRAVENOUS

## 2019-05-01 MED ORDER — VERAPAMIL HCL 2.5 MG/ML IV SOLN
INTRAVENOUS | Status: AC
  Filled 2019-05-01: qty 2

## 2019-05-01 MED ORDER — METOPROLOL TARTRATE 12.5 MG HALF TABLET
12.5000 mg | ORAL_TABLET | Freq: Two times a day (BID) | ORAL | Status: DC
  Administered 2019-05-01 – 2019-05-04 (×7): 12.5 mg via ORAL
  Filled 2019-05-01 (×8): qty 1

## 2019-05-01 MED ORDER — HEPARIN (PORCINE) IN NACL 1000-0.9 UT/500ML-% IV SOLN
INTRAVENOUS | Status: DC | PRN
  Administered 2019-05-01 (×2): 500 mL

## 2019-05-01 MED ORDER — SODIUM CHLORIDE 0.9 % IV SOLN: 250.0000 mL | INTRAVENOUS | Status: DC | PRN

## 2019-05-01 MED ORDER — SODIUM CHLORIDE 0.9 % IV SOLN: INTRAVENOUS | Status: DC

## 2019-05-01 MED ORDER — BIVALIRUDIN TRIFLUOROACETATE 250 MG IV SOLR
INTRAVENOUS | Status: AC
  Filled 2019-05-01: qty 250

## 2019-05-01 MED ORDER — TICAGRELOR 90 MG PO TABS
ORAL_TABLET | ORAL | Status: AC
  Filled 2019-05-01: qty 2

## 2019-05-01 MED ORDER — FENTANYL CITRATE (PF) 100 MCG/2ML IJ SOLN
INTRAMUSCULAR | Status: AC
  Filled 2019-05-01: qty 2

## 2019-05-01 MED ORDER — ORAL CARE MOUTH RINSE
15.0000 mL | Freq: Two times a day (BID) | OROMUCOSAL | Status: DC
  Administered 2019-05-01 – 2019-05-05 (×9): 15 mL via OROMUCOSAL

## 2019-05-01 MED ORDER — HEPARIN SODIUM (PORCINE) 1000 UNIT/ML IJ SOLN
INTRAMUSCULAR | Status: AC
  Filled 2019-05-01: qty 1

## 2019-05-01 MED ORDER — DEXTROSE 50 % IV SOLN
INTRAVENOUS | Status: AC
  Filled 2019-05-01: qty 50

## 2019-05-01 MED ORDER — FENTANYL CITRATE (PF) 100 MCG/2ML IJ SOLN
INTRAMUSCULAR | Status: DC | PRN
  Administered 2019-05-01: 25 ug via INTRAVENOUS

## 2019-05-01 MED ORDER — MIDAZOLAM HCL 2 MG/2ML IJ SOLN
INTRAMUSCULAR | Status: DC | PRN
  Administered 2019-05-01: 1 mg via INTRAVENOUS

## 2019-05-01 MED ORDER — LIDOCAINE HCL (PF) 1 % IJ SOLN
INTRAMUSCULAR | Status: DC | PRN
  Administered 2019-05-01: 20 mL

## 2019-05-01 MED ORDER — CHLORHEXIDINE GLUCONATE CLOTH 2 % EX PADS
6.0000 | MEDICATED_PAD | Freq: Every day | CUTANEOUS | Status: DC
  Administered 2019-05-01 – 2019-05-05 (×4): 6 via TOPICAL

## 2019-05-01 MED ORDER — TICAGRELOR 90 MG PO TABS
ORAL_TABLET | ORAL | Status: DC | PRN
  Administered 2019-05-01: 180 mg via ORAL

## 2019-05-01 MED ORDER — TICAGRELOR 90 MG PO TABS
90.0000 mg | ORAL_TABLET | Freq: Two times a day (BID) | ORAL | Status: DC
  Administered 2019-05-01 – 2019-05-05 (×9): 90 mg via ORAL
  Filled 2019-05-01 (×9): qty 1

## 2019-05-01 MED ORDER — SODIUM CHLORIDE 0.9 % IV SOLN
INTRAVENOUS | Status: DC
  Administered 2019-05-01: 06:00:00 via INTRAVENOUS

## 2019-05-01 MED ORDER — ATROPINE SULFATE 1 MG/10ML IJ SOSY
PREFILLED_SYRINGE | INTRAMUSCULAR | Status: AC
  Filled 2019-05-01: qty 10

## 2019-05-01 MED ORDER — ASPIRIN 81 MG PO CHEW: 81.0000 mg | CHEWABLE_TABLET | ORAL | Status: DC

## 2019-05-01 MED ORDER — INSULIN ASPART 100 UNIT/ML ~~LOC~~ SOLN
0.0000 [IU] | Freq: Every day | SUBCUTANEOUS | Status: DC
  Administered 2019-05-03: 2 [IU] via SUBCUTANEOUS

## 2019-05-01 SURGICAL SUPPLY — 19 items
BALLN SAPPHIRE 1.5X10 (BALLOONS) ×2
BALLN SAPPHIRE 2.0X15 (BALLOONS) ×2
BALLN SAPPHIRE ~~LOC~~ 3.75X12 (BALLOONS) ×2 IMPLANT
BALLOON SAPPHIRE 1.5X10 (BALLOONS) ×1 IMPLANT
BALLOON SAPPHIRE 2.0X15 (BALLOONS) ×1 IMPLANT
CATH INFINITI 5FR MULTPACK ANG (CATHETERS) ×2 IMPLANT
CATH LAUNCHER 6FR EBU 4 (CATHETERS) ×2 IMPLANT
KIT ENCORE 26 ADVANTAGE (KITS) ×2 IMPLANT
KIT HEART LEFT (KITS) ×2 IMPLANT
PACK CARDIAC CATHETERIZATION (CUSTOM PROCEDURE TRAY) ×2 IMPLANT
SHEATH PINNACLE 5F 10CM (SHEATH) ×2 IMPLANT
SHEATH PINNACLE 6F 10CM (SHEATH) ×2 IMPLANT
SHEATH PROBE COVER 6X72 (BAG) ×2 IMPLANT
STENT SYNERGY DES 2.5X24 (Permanent Stent) ×2 IMPLANT
STENT SYNERGY DES 3.5X16 (Permanent Stent) ×2 IMPLANT
TRANSDUCER W/STOPCOCK (MISCELLANEOUS) ×2 IMPLANT
TUBING CIL FLEX 10 FLL-RA (TUBING) ×4 IMPLANT
WIRE ASAHI PROWATER 180CM (WIRE) ×4 IMPLANT
WIRE EMERALD 3MM-J .035X150CM (WIRE) ×2 IMPLANT

## 2019-05-01 NOTE — Progress Notes (Signed)
[facility] [place]  Subjective  - No complaints.  RIJ [place] working well.   Objective (!) 168/63 (!) 51 97.6 F (36.4 C) (Oral) 18 100%  Intake/Output Summary (Last 24 hours) at 05/01/2019 0749 Last data filed at 05/01/2019 0700 Gross per 24 hour  Intake 359.31 ml  Output 1270 ml  Net -910.69 ml    RIJ tunneled catheter in place - no hematoma, site looks good Right arm AVF with good thrill s/p plication, incision c/d/i  Laboratory Lab Results: Recent Labs    04/30/19 1838 05/01/19 0248  WBC 6.4 8.4  HGB 11.3* 11.4*  HCT 36.3* 36.6*  PLT 147* 161   BMET Recent Labs    04/30/19 1839 05/01/19 0248  NA 141 143  K 4.8 5.0  CL 106 107  CO2 16* 18*  GLUCOSE 125* 107*  BUN 92* 78*  CREATININE 16.04* 12.93*  CALCIUM 8.2* 8.4*    COAG Lab Results  Component Value Date   INR 1.47 02/07/2018   INR 1.33 07/02/2014   No results found for: PTT  Assessment/Planning:  RIJ tunneled catheter working well in ICU this am.  Vascular will sign off.  Call with questions or concerns.  Will arrange follow-up in 4 weeks for wound check after plication by Dr. Trula Slade.  Marty Heck 05/01/2019 7:49 AM --

## 2019-05-01 NOTE — Progress Notes (Addendum)
1. Subjective:  Noted last pm syncopal episode with SBP 60s this was pos op permcath procedure  given Narcan x2 and woke up    Sp card cath  This am noted showed two-vessel disease and required DES to left circumflex and proximal LAD /   Currently tolerating CRRT,(Crrt done 2/2 low bp s) now not on pressors/ no sob or cp   bp stable   Objective Vital signs in last 24 hours: Vitals:   05/01/19 1130 05/01/19 1200 05/01/19 1230 05/01/19 1300  BP: (!) 158/89 (!) 158/102 (!) 152/111 127/62  Pulse: 63 (!) 56 (!) 54 (!) 59  Resp: 17 14 (!) 36 15  Temp:      TempSrc:      SpO2: 100% 100% 100% 98%  Weight:      Height:       Weight change: 0 kg  Physical Exam: General: alert calm NAD chronically ill male in 2 H  Heart: RRR, no m,r,g Lungs: CTA ,Non labored breathing  Abdomen:obese, soft , NT, ND Extremities: no pedal edema , R grt toe amp site dry /healed Dialysis Access:  R IJ Perm cath patent on CRRT currently , R UA AVF pos bruit/thrilll  /stable surgical site at recent plication of avf   OP HD [facility]: [facility]  4h  83kg kg (last 2 txs below edw )  2K/3.5CA  Hep 6000 RFA AVF     Hec 2 mcg IV/HD Venofer 50mg  q wkly   No ESA    Assessment/Plan 1. Pulmonary  Edema/ vol overload 2/74missed hd / also in setting of NSTEMI  2. .NSTEMI with CAD sp C cath this am =DES to left circumflex and proximal LAD  3. ESRD -   admit K 6_>5.6  After meds ,  k=5.0 this am / CRRT 2/2 low bp /But  Should be able to to tolerate  HD next tx   4. Malfunctioning R AVF  Sp plication [id number],  VVS place R IJ Perm cath 8/17  Has seen and noted (" Will arrange follow-up in 4 weeks eval avf ")  5. Hypertension/volume  - as above vol ^ , Bp stable ,no op meds 6. Syncope = wu per admit  7. Anemia  - hgb 11.4  No OP esa , weekly Fe on HD  8. Metabolic bone disease -  Hectorol  On hd and binders 9. DM Type 2 per admit  Ernest Haber, PA-C [place] [phone] 05/01/2019,1:45 PM  LOS: 2 days    Pt seen, examined and agree w A/P as above. Pt is off pressors and doing well.  Will plan 4 more hrs of CRRT w/ large UF then dc CRRT and will plan for regular HD tomorrow (upstairs hopefully if he is stable).   Kelly Splinter  MD 05/01/2019, 3:19 PM    Labs: Basic Metabolic Panel: Recent Labs  Lab 04/30/19 0233 04/30/19 1839 05/01/19 0248  NA 144 141 143  K 5.6* 4.8 5.0  CL 107 106 107  CO2 19* 16* 18*  GLUCOSE 125* 125* 107*  BUN 93* 92* 78*  CREATININE 15.37* 16.04* 12.93*  CALCIUM 8.5* 8.2* 8.4*  PHOS 8.6*  --  6.8*   Liver Function Tests: Recent Labs  Lab 04/29/19 1938 04/30/19 0233 05/01/19 0248  AST 443*  --   --   ALT 147*  --   --   ALKPHOS 168*  --   --   BILITOT 0.4  --   --  PROT 7.1  --   --   ALBUMIN 3.1* 3.0* 3.2*   No results for input(s): LIPASE, AMYLASE in the last 168 hours. No results for input(s): AMMONIA in the last 168 hours. CBC: Recent Labs  Lab 04/29/19 1938  04/30/19 0233 04/30/19 1838 05/01/19 0248  WBC 7.7  --  8.6 6.4 8.4  NEUTROABS 5.3  --   --   --   --   HGB 11.4*   < > 10.9* 11.3* 11.4*  HCT 37.5*   < > 34.8* 36.3* 36.6*  MCV 93.8  --  91.3 91.9 91.3  PLT 180  --  180 147* 161   < > = values in this interval not displayed.   Cardiac Enzymes: No results for input(s): CKTOTAL, CKMB, CKMBINDEX, TROPONINI in the last 168 hours. CBG: Recent Labs  Lab 04/30/19 1943 04/30/19 2321 05/01/19 0343 05/01/19 0752 05/01/19 1213  GLUCAP 129* 94 91 76 72    Medications: .  prismasol BGK 4/2.5 500 mL/hr at 05/01/19 0748  .  prismasol BGK 4/2.5 500 mL/hr at 05/01/19 0751  . sodium chloride 10 mL/hr at 05/01/19 1100  . sodium chloride    . prismasol BGK 4/2.5 1,500 mL/hr at 05/01/19 0749   . aspirin EC  81 mg Oral Daily  . atorvastatin  40 mg Oral QHS  . calcium acetate  667 mg Oral TID WC  . Chlorhexidine Gluconate Cloth  6 each Topical Daily  . insulin aspart  0-9 Units Subcutaneous Q4H  . mouth rinse  15 mL Mouth Rinse  BID  . metoprolol tartrate  12.5 mg Oral BID  . multivitamin  1 tablet Oral QHS  . pantoprazole  40 mg Oral BID  . sodium chloride flush  3 mL Intravenous Q12H  . sodium chloride flush  3 mL Intravenous Q12H  . ticagrelor  90 mg Oral BID

## 2019-05-01 NOTE — Progress Notes (Signed)
CRITICAL VALUE ALERT  Critical Value:  CBG 79  Date & Time Notied:  05/01/2019 0758  Orders Received/Actions taken: 25 ml D5 given. RN will recheck CBG.

## 2019-05-01 NOTE — Progress Notes (Addendum)
Progress Note  Patient Name: Blake [name] Date of Encounter: 05/01/2019  Primary Cardiologist: Dr Stanford Breed  Subjective   No CP or dyspnea  Inpatient Medications    Scheduled Meds: . aspirin EC  81 mg Oral Daily  . atorvastatin  40 mg Oral QHS  . calcium acetate  667 mg Oral TID WC  . Chlorhexidine Gluconate Cloth  6 each Topical Daily  . insulin aspart  0-9 Units Subcutaneous Q4H  . mouth rinse  15 mL Mouth Rinse BID  . multivitamin  1 tablet Oral QHS  . pantoprazole  40 mg Oral BID   Continuous Infusions: .  prismasol BGK 4/2.5 500 mL/hr at 04/30/19 2052  .  prismasol BGK 4/2.5 500 mL/hr at 04/30/19 2052  . sodium chloride 10 mL/hr at 05/01/19 0700  . heparin 1,000 Units/hr (05/01/19 0700)  . prismasol BGK 4/2.5 1,500 mL/hr at 05/01/19 0423   PRN Meds: heparin   Vital Signs    Vitals:   05/01/19 0530 05/01/19 0600 05/01/19 0630 05/01/19 0700  BP: (!) 132/59 133/62 132/67 122/65  Pulse: (!) 57 (!) 55 (!) 57 (!) 54  Resp: 17 14 16  (!) 22  Temp:      TempSrc:      SpO2: 96% 97% 94% 95%  Weight:  85.4 kg    Height:        Intake/Output Summary (Last 24 hours) at 05/01/2019 0721 Last data filed at 05/01/2019 0700 Gross per 24 hour  Intake 359.31 ml  Output 1270 ml  Net -910.69 ml   Last 3 Weights 05/01/2019 04/30/2019 04/29/2019  Weight (lbs) 188 lb 4.4 oz 190 lb 0.6 oz 190 lb 0.6 oz  Weight (kg) 85.4 kg 86.2 kg 86.2 kg      Telemetry    Sinus - Personally Reviewed   Physical Exam   GEN: No acute distress.  On CRRT Neck: supple Cardiac: RRR, no murmurs, rubs, or gallops.  Respiratory: Clear to auscultation bilaterally. GI: Soft, nontender, non-distended  MS: No edema Neuro:  Nonfocal  Psych: Normal affect   Labs    High Sensitivity Troponin:   Recent Labs  Lab 04/29/19 1938 04/29/19 2220 04/30/19 0233 04/30/19 1839 05/01/19 0248  TROPONINIHS 169* 4,723* 18,435* 8,140* 4,035*      Chemistry Recent Labs  Lab 04/29/19 1938   04/30/19 0233 04/30/19 1839 05/01/19 0248  NA 143   < > 144 141 143  K 5.7*   < > 5.6* 4.8 5.0  CL 103   < > 107 106 107  CO2 15*  --  19* 16* 18*  GLUCOSE 211*   < > 125* 125* 107*  BUN 91*   < > 93* 92* 78*  CREATININE 15.20*   < > 15.37* 16.04* 12.93*  CALCIUM 8.5*  --  8.5* 8.2* 8.4*  PROT 7.1  --   --   --   --   ALBUMIN 3.1*  --  3.0*  --  3.2*  AST 443*  --   --   --   --   ALT 147*  --   --   --   --   ALKPHOS 168*  --   --   --   --   BILITOT 0.4  --   --   --   --   GFRNONAA 3*  --  3* 3* 3*  GFRAA 3*  --  3* 3* 4*  ANIONGAP 25*  --  18* 19* 18*   < > =  values in this interval not displayed.     Hematology Recent Labs  Lab 04/30/19 0233 04/30/19 1838 05/01/19 0248  WBC 8.6 6.4 8.4  RBC 3.81* 3.95* 4.01*  HGB 10.9* 11.3* 11.4*  HCT 34.8* 36.3* 36.6*  MCV 91.3 91.9 91.3  MCH 28.6 28.6 28.4  MCHC 31.3 31.1 31.1  RDW 14.7 15.0 14.8  PLT 180 147* 161    Radiology    Dg Chest 1 View  Result Date: 04/30/2019 CLINICAL DATA:  Shortness of breath EXAM: CHEST  1 VIEW COMPARISON:  04/30/2019 FINDINGS: Bilateral interstitial and patchy alveolar airspace opacities. No pleural effusion or pneumothorax. Stable cardiomegaly. No acute osseous abnormality. Right-sided central venous catheter with the tip projecting over the cavoatrial junction. IMPRESSION: Findings concerning for mild pulmonary edema. Electronically Signed   By: Kathreen Devoid   On: 04/30/2019 19:55   Ct Head Wo Contrast  Result Date: 04/29/2019 CLINICAL DATA:  Seizure, new, nontraumatic, >40 yrs EXAM: CT HEAD WITHOUT CONTRAST TECHNIQUE: Contiguous axial images were obtained from the base of the skull through the vertex without intravenous contrast. COMPARISON:  Head CT 02/08/2018 FINDINGS: Brain: No intracranial hemorrhage, mass effect, or midline shift. Generalized atrophy. Unchanged chronic small vessel ischemia, moderate for age. No hydrocephalus. The basilar cisterns are patent. No evidence of territorial  infarct or acute ischemia. No extra-axial or intracranial fluid collection. Vascular: Atherosclerosis of skullbase vasculature without hyperdense vessel or abnormal calcification. Skull: No fracture or focal lesion. Sinuses/Orbits: Paranasal sinuses and mastoid air cells are clear. The visualized orbits are unremarkable. Left cataract resection. Other: None. IMPRESSION: 1. No acute intracranial abnormality. 2. Unchanged atrophy and chronic small vessel ischemia. Electronically Signed   By: Keith Rake M.D.   On: 04/29/2019 22:33   Ct Angio Chest Pe W And/or Wo Contrast  Result Date: 04/30/2019 CLINICAL DATA:  PE suspected, high pretest prob Patient found unresponsive in bathroom, missed last 3 dialysis appointments. EXAM: CT ANGIOGRAPHY CHEST WITH CONTRAST TECHNIQUE: Multidetector CT imaging of the chest was performed using the standard protocol during bolus administration of intravenous contrast. Multiplanar CT image reconstructions and MIPs were obtained to evaluate the vascular anatomy. CONTRAST:  39mL OMNIPAQUE IOHEXOL 350 MG/ML SOLN COMPARISON:  Radiograph yesterday. No prior chest CT. Lung bases from abdominal CT 02/08/2018 FINDINGS: Cardiovascular: There are no filling defects within the pulmonary arteries to suggest pulmonary embolus. Aortic atherosclerosis without aneurysm or dissection. Coronary artery calcifications. Mild cardiomegaly. No pericardial effusion. Mediastinum/Nodes: Enlarged lower paratracheal node at 14 mm, series 5, image 36. Enlarged right hilar node measuring 2.6 cm, image 50. Multiple additional mildly enlarged and prominent bilateral hilar and mediastinal nodes. Decompressed esophagus. Small hiatal hernia. High-density material in the distal esophagus and proximal stomach. No visualized thyroid nodule. Lungs/Pleura: Fine reticular and ground-glass opacities present in both lungs, right greater than left and more prominent at the lung bases. Similar reticular opacities seen on  prior abdominal CT, however currently increased from that exam. Clustered calcified pulmonary nodules in the right lung. Mild irregular fissural thickening. No septal thickening or pleural fluid. Central bronchial thickening, trachea and mainstem bronchi are patent. Upper Abdomen: Small hiatal hernia. No acute findings. Musculoskeletal: There are no acute or suspicious osseous abnormalities. Review of the MIP images confirms the above findings. IMPRESSION: 1. No pulmonary embolus. 2. Fine reticular and ground-glass opacities in both lungs, right greater than left and more prominent at the lung bases. Similar reticular opacities seen on 2019 abdominal CT, however currently increased from that exam. Suspect pulmonary edema  or acute infection superimposed on chronic interstitial lung disease. Consider nonemergent high resolution chest CT for characterization. 3. Mild mediastinal and right hilar adenopathy is likely reactive, but nonspecific. 4. Aortic Atherosclerosis (ICD10-I70.0). Coronary artery calcifications. 5. Small hiatal hernia. Electronically Signed   By: Keith Rake M.D.   On: 04/30/2019 00:25   Dg Chest Port 1 View  Result Date: 04/30/2019 CLINICAL DATA:  Status post left heart catheterization EXAM: PORTABLE CHEST 1 VIEW COMPARISON:  04/29/2019 FINDINGS: Cardiac shadow is stable. Dialysis catheter is now seen in satisfactory position. Stable opacities are noted throughout both lungs similar to that seen on the prior exam likely related to mild pulmonary edema. No sizable effusion is seen. No pneumothorax is noted. IMPRESSION: Stable opacities bilaterally.  No new focal abnormality is seen. Electronically Signed   By: Inez Catalina M.D.   On: 04/30/2019 16:39   Dg Chest Portable 1 View  Result Date: 04/29/2019 CLINICAL DATA:  Dyspnea. Syncope. Found unresponsive in bathroom. Missed dialysis. EXAM: PORTABLE CHEST 1 VIEW COMPARISON:  Radiograph 12/10/2018 FINDINGS: Cardiomegaly is unchanged.  Unchanged mediastinal contours. Fine reticular opacities throughout both lungs which are more prominent than on prior exam. Small right and possibly left pleural effusions. No confluent airspace disease. No pneumothorax. No acute osseous abnormalities. IMPRESSION: Cardiomegaly with pulmonary edema and small right and possibly left pleural effusions. Electronically Signed   By: Keith Rake M.D.   On: 04/29/2019 20:25   Hybrid Or Imaging (mc Only)  Result Date: 04/30/2019 There is no interpretation for this exam.  This order is for images obtained during a surgical procedure.  Please See "Surgeries" Tab for more information regarding the procedure.    Patient Profile     73 y.o. male with past medical history of diabetes mellitus, hypertension, hyperlipidemia, end-stage renal disease dialysis dependent with non-ST elevation myocardial infarction and syncope.  Assessment & Plan    1 non-ST elevation myocardial infarction-plan to continue aspirin, heparin, statin and metoprolol.  Proceed with cardiac catheterization today.  The risks and benefits including myocardial infarction, CVA and death discussed and he agrees to proceed.    2 episode of hypotension/agonal breathing following dialysis catheter placement-etiology of events last evening unclear but no documented arrhythmia and by notes patient did not lose pulse.  Follow on telemetry. ? oversedation  3 syncope-occurred prior to admission and in the setting of having a bowel movement.  Description sounds like defecation syncope. Continue telemetry.  Echocardiogram shows normal LV function.  3 end-stage renal disease-patient had not been dialyzed for 1 week prior to admission.  He was volume overloaded and hyperkalemic.  He is now being dialyzed and nephrology following.  4 diabetes mellitus-Per primary care.  For questions or updates, please contact [place] Please consult [email or web address] for contact info under        Signed,  Kirk Ruths, MD  05/01/2019, 7:21 AM

## 2019-05-01 NOTE — Progress Notes (Signed)
ANTICOAGULATION CONSULT NOTE  Pharmacy Consult for Heparin Indication: chest pain/ACS   Patient Measurements: Height: 5\' 8"  (172.7 cm) Weight: 190 lb 0.6 oz (86.2 kg) IBW/kg (Calculated) : 68.4  Vital Signs: Temp: 97.6 F (36.4 C) (08/18 0000) Temp Source: Oral (08/18 0000) BP: 119/67 (08/18 0300) Pulse Rate: 54 (08/18 0300)  Labs: Recent Labs    04/30/19 0233 04/30/19 1015 04/30/19 1838 04/30/19 1839 05/01/19 0248  HGB 10.9*  --  11.3*  --  11.4*  HCT 34.8*  --  36.3*  --  36.6*  PLT 180  --  147*  --  161  APTT  --   --   --   --  75*  HEPARINUNFRC  --  1.09*  --   --  0.29*  CREATININE 15.37*  --   --  16.04* 12.93*  TROPONINIHS 18,435*  --   --  8,140* 4,035*    Estimated Creatinine Clearance: 5.6 mL/min (A) (by C-G formula based on SCr of 12.93 mg/dL (H)).  Assessment: 73 y.o. male presented to the ED s/p syncope continues on IV heparin.  Initial Heparin level was high and heparin was held then reduced.  This PM, Heparin infusion was held for tunneled HD catheter placement and not resumed.  Discussed with Dr. Donzetta Matters, VVS who confirmed that it was ok to resume Heparin.  Dr. Carolin Sicks aware patient on IV Heparin. Patient is starting CRRT.   Most recent heparin level returned sightly subtherapeutic. CBC is stable.  Goal of Therapy:  Heparin level 0.3-0.7 units/ml Monitor platelets by anticoagulation protocol: Yes   Plan:  -Increase heparin to 1000 units/hr -Daily HL, CBC -Check level in 8 hours   Harvel Quale 05/01/2019 3:46 AM

## 2019-05-01 NOTE — Interval H&P Note (Signed)
History and Physical Interval Note:  05/01/2019 8:56 AM  [name]  has presented today for surgery, with the diagnosis of unstable angina.  The various methods of treatment have been discussed with the patient and family. After consideration of risks, benefits and other options for treatment, the patient has consented to  Procedure(s): LEFT HEART CATH AND CORONARY ANGIOGRAPHY (N/A) as a surgical intervention.  The patient's history has been reviewed, patient examined, no change in status, stable for surgery.  I have reviewed the patient's chart and labs.  Questions were answered to the patient's satisfaction.   Cath Lab Visit (complete for each Cath Lab visit)  Clinical Evaluation Leading to the Procedure:   ACS: Yes.    Non-ACS:    Anginal Classification: CCS II  Anti-ischemic medical therapy: No Therapy  Non-Invasive Test Results: No non-invasive testing performed  Prior CABG: No previous CABG        Collier Salina [facility] 05/01/2019 8:57 AM

## 2019-05-01 NOTE — Progress Notes (Signed)
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 In the ER, found to have positive troponin and abnormal EKG nephrology and cardiology consulted and was admitted. CT head has not shown any acute findings.  Chest x-ray revealed cardiomegaly with pulmonary edema and small right and possible left pleural effusions.  Patient had episode of unresponsiveness while being transferred to his room following his HD catheter placement in OR-got Narcan x2, blood pressure was in 60s, never lost his pulse.  Following this event he was transferred to ICU where he had CRRT overnight.  Subjective: Seen this afternoon status post cardiac cath with stents x2.  Reports he is trying to take a nap. No acute events no nausea vomiting chest pain fever or chills.  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  Syncopal episode: Unclear etiology, likely multifactorial-has missed hemodialysis couple of times.  CT head unremarkable, status post cardiac cath as below.  Check orthostatics monitor output.  Continue HD/CRRT per nephrology.   NSTEMI: Status post cardiac cath with DES stent in left circumflex and LAD.   Continue DAPT x1 year, Lipitor, metoprolol.  Appreciate cardiology input.   ESRD on HD TTHS- last HD on last Tuesday. Missed 2 prior HD, nephrology on board, currently on CRRT being continued till tonight.  Hopefully can tolerate HD tomorrow per schedule.  Fluid overload/pulmonary edema Acute, from missed dialysis: On CRRT, HD per nephrology from tomorrow on the schedule.   HyperKalemia:resolved.  Type 2 diabetes mellitus: Blood sugar continue diet, continue sliding scale insulin. Recent Labs  Lab 05/01/19 0343 05/01/19 0752 05/01/19 1213 05/01/19 1356 05/01/19 1518  GLUCAP 91 76 72 81 108*   Hypertension: BP not antihypertensive due to previous orthostatic hypotension Dyslipidemia: on Lipitor  Body mass index is 28.63 kg/m.   DVT prophylaxis:Heparin Code Status: full Family Communication:Plan of care discussed with patient in detail.   Disposition Plan: Remains inpatient pending clinical improvement. Patient was transferred to ICU 8/17 and is being transferred on hospital service, once he is off CRRT he will be transferred to stepdown.  Consultants:  Nephrology  Cardiology  Procedures: HD today  CTA  1. No pulmonary embolus. 2. Fine reticular and ground-glass opacities in both lungs, right greater than left and more prominent at the lung bases. Similar reticular opacities seen on 2019 abdominal CT, however currently increased from that exam. Suspect pulmonary edema or acute infection superimposed on chronic interstitial lung disease. Consider nonemergent high resolution chest CT for characterization. 3. Mild mediastinal and right hilar adenopathy is likely reactive, but nonspecific. 4. Aortic Atherosclerosis (ICD10-I70.0). Coronary artery calcifications. 5. Small hiatal hernia. Microbiology:  Antimicrobials: Anti-infectives (From admission, onward)   None       Objective: Vitals:   05/01/19 1445 05/01/19 1450 05/01/19 1455 05/01/19 1500  BP: (!) 139/59  131/60 (!) 129/57 (!) 129/58  Pulse: 61 60 61 60  Resp: (!) 22 16 15 16   Temp:      TempSrc:      SpO2: 100% 100% 100% 100%  Weight:      Height:        Intake/Output Summary (Last 24 hours) at 05/01/2019 1533 Last data filed at 05/01/2019 1500 Gross per 24 hour  Intake 312.65 ml  Output 2227 ml  Net -1914.35 ml   Filed Weights   04/29/19 1930 04/30/19 2000 05/01/19 0600  Weight: 86.2 kg 86.2 kg 85.4 kg   Weight change: 0 kg  Body mass index is 28.63 kg/m.  Intake/Output from previous day: 08/17 0701 - 08/18 0700 In: 359.3 [I.V.:359.3] Out: 1270 [Blood:5] Intake/Output this shift: Total I/O In: 203.3 [P.O.:120; I.V.:83.3] Out: 962 [Other:962]  Examination:  General exam: Alert awake and comfortable, not in acute distress.   HEENT:PERRL,Oral mucosa moist, Ear/Nose normal on gross exam Respiratory system: Bilateral equal air entry, normal vesicular breath sounds, no wheezes or crackles  Cardiovascular system: S1 & S2 heard,No JVD, murmurs. Gastrointestinal system: Abdomen is  soft, non tender, non distended, BS +  Nervous System:Alert and oriented. No focal neurological deficits/moving extremities, sensation intact. Extremities: Right upper extremity with fistula with thrill with recent surgical site wound.  HD catheter on the chest.  Skin: No rashes, lesions, no icterus MSK: Normal muscle bulk,tone ,power  Medications:  Scheduled Meds:  aspirin EC  81 mg Oral Daily   atorvastatin  40 mg Oral QHS   calcium acetate  1,334 mg Oral TID WC   Chlorhexidine Gluconate Cloth  6 each Topical Daily   insulin aspart  0-9 Units Subcutaneous Q4H   mouth rinse  15 mL Mouth Rinse BID   metoprolol tartrate  12.5 mg Oral BID   multivitamin  1 tablet Oral QHS   pantoprazole  40 mg Oral BID   sodium chloride flush  3 mL Intravenous Q12H   sodium chloride flush  3 mL Intravenous Q12H   ticagrelor  90 mg Oral BID   Continuous Infusions:   prismasol BGK 4/2.5 500  mL/hr at 05/01/19 0748    prismasol BGK 4/2.5 500 mL/hr at 05/01/19 0751   sodium chloride 10 mL/hr at 05/01/19 1100   sodium chloride     prismasol BGK 4/2.5 1,500 mL/hr at 05/01/19 1400    Data Reviewed: I have personally reviewed following labs and imaging studies  CBC: Recent Labs  Lab 04/29/19 1938 04/29/19 1950 04/30/19 0233 04/30/19 1838 05/01/19 0248  WBC 7.7  --  8.6 6.4 8.4  NEUTROABS 5.3  --   --   --   --   HGB 11.4* 12.6* 10.9* 11.3* 11.4*  HCT 37.5* 37.0* 34.8* 36.3* 36.6*  MCV 93.8  --  91.3 91.9 91.3  PLT 180  --  180 147* [id number]   Basic Metabolic Panel: Recent Labs  Lab 04/29/19 1938 04/29/19 1950 04/30/19 0233 04/30/19 1839 05/01/19 0248  NA 143 143 144 141 143  K 5.7* 6.0* 5.6* 4.8 5.0  CL 103 110 107 106 107  CO2 15*  --  19* 16* 18*  GLUCOSE 211* 196* 125* 125* 107*  BUN 91* 89* 93* 92* 78*  CREATININE 15.20* 15.30* 15.37* 16.04* 12.93*  CALCIUM 8.5*  --  8.5* 8.2* 8.4*  MG 2.2  --  2.2  --  2.2  PHOS  --   --  8.6*  --  6.8*  GFR: Estimated Creatinine Clearance: 5.6 mL/min (A) (by C-G formula based on SCr of 12.93 mg/dL (H)). Liver Function Tests: Recent Labs  Lab 04/29/19 1938 04/30/19 0233 05/01/19 0248  AST 443*  --   --   ALT 147*  --   --   ALKPHOS 168*  --   --   BILITOT 0.4  --   --   PROT 7.1  --   --   ALBUMIN 3.1* 3.0* 3.2*   No results for input(s): LIPASE, AMYLASE in the last 168 hours. No results for input(s): AMMONIA in the last 168 hours. Coagulation Profile: No results for input(s): INR, PROTIME in the last 168 hours. Cardiac Enzymes: No results for input(s): CKTOTAL, CKMB, CKMBINDEX, TROPONINI in the last 168 hours. BNP (last 3 results) No results for input(s): PROBNP in the last 8760 hours. HbA1C: No results for input(s): HGBA1C in the last 72 hours. CBG: Recent Labs  Lab 05/01/19 0343 05/01/19 0752 05/01/19 1213 05/01/19 1356 05/01/19 1518  GLUCAP 91 76 72 81 108*   Lipid Profile: No results for  input(s): CHOL, HDL, LDLCALC, TRIG, CHOLHDL, LDLDIRECT in the last 72 hours. Thyroid Function Tests: Recent Labs    04/30/19 0233  TSH 0.436   Anemia Panel: No results for input(s): VITAMINB12, FOLATE, FERRITIN, TIBC, IRON, RETICCTPCT in the last 72 hours. Sepsis Labs: No results for input(s): PROCALCITON, LATICACIDVEN in the last 168 hours.  Recent Results (from the past 240 hour(s))  SARS Coronavirus 2 [facility] order, Performed in [facility] hospital lab) Nasopharyngeal Nasopharyngeal Swab     Status: None   Collection Time: 04/24/19 12:08 PM   Specimen: Nasopharyngeal Swab  Result Value Ref Range Status   SARS Coronavirus 2 NEGATIVE NEGATIVE Final    Comment: (NOTE) If result is NEGATIVE SARS-CoV-2 target nucleic acids are NOT DETECTED. The SARS-CoV-2 RNA is generally detectable in upper and lower  respiratory specimens during the acute phase of infection. The lowest  concentration of SARS-CoV-2 viral copies this assay can detect is 250  copies / mL. A negative result does not preclude SARS-CoV-2 infection  and should not be used as the sole basis for treatment or other  patient management decisions.  A negative result may occur with  improper specimen collection / handling, submission of specimen other  than nasopharyngeal swab, presence of viral mutation(s) within the  areas targeted by this assay, and inadequate number of viral copies  (<250 copies / mL). A negative result must be combined with clinical  observations, patient history, and epidemiological information. If result is POSITIVE SARS-CoV-2 target nucleic acids are DETECTED. The SARS-CoV-2 RNA is generally detectable in upper and lower  respiratory specimens dur ing the acute phase of infection.  Positive  results are indicative of active infection with SARS-CoV-2.  Clinical  correlation with patient history and other diagnostic information is  necessary to determine patient infection status.  Positive results  do  not rule out bacterial infection or co-infection with other viruses. If result is PRESUMPTIVE POSTIVE SARS-CoV-2 nucleic acids MAY BE PRESENT.   A presumptive positive result was obtained on the submitted specimen  and confirmed on repeat testing.  While 2019 novel coronavirus  (SARS-CoV-2) nucleic acids may be present in the submitted sample  additional confirmatory testing may be necessary for epidemiological  and / or clinical management purposes  to differentiate between  SARS-CoV-2 and other Sarbecovirus currently known to infect humans.  If clinically indicated additional testing with an alternate test  methodology [id number])  is advised. The SARS-CoV-2 RNA is generally  detectable in upper and lower respiratory sp ecimens during the acute  phase of infection. The expected result is Negative. Fact Sheet for Patients:  StrictlyIdeas.no Fact Sheet for Healthcare Providers: BankingDealers.co.za This test is not yet approved or cleared by the [place] FDA and has been authorized for detection and/or diagnosis of SARS-CoV-2 by FDA under an Emergency Use Authorization (EUA).  This EUA will remain in effect (meaning this test can be used) for the duration of the COVID-19 declaration under Section 564(b)(1) of the Act, 21 U.S.C. section 360bbb-3(b)(1), unless the authorization is terminated or revoked sooner. Performed at [facility], [place] [address], [place], [place] [address]   SARS Coronavirus 2 [facility] order, Performed in [facility] hospital lab) Nasopharyngeal Nasopharyngeal Swab     Status: None   Collection Time: 04/29/19  7:22 PM   Specimen: Nasopharyngeal Swab  Result Value Ref Range Status   SARS Coronavirus 2 NEGATIVE NEGATIVE Final    Comment: (NOTE) If result is NEGATIVE SARS-CoV-2 target nucleic acids are NOT DETECTED. The SARS-CoV-2 RNA is generally detectable in upper and lower  respiratory specimens  during the acute phase of infection. The lowest  concentration of SARS-CoV-2 viral copies this assay can detect is 250  copies / mL. A negative result does not preclude SARS-CoV-2 infection  and should not be used as the sole basis for treatment or other  patient management decisions.  A negative result may occur with  improper specimen collection / handling, submission of specimen other  than nasopharyngeal swab, presence of viral mutation(s) within the  areas targeted by this assay, and inadequate number of viral copies  (<250 copies / mL). A negative result must be combined with clinical  observations, patient history, and epidemiological information. If result is POSITIVE SARS-CoV-2 target nucleic acids are DETECTED. The SARS-CoV-2 RNA is generally detectable in upper and lower  respiratory specimens dur ing the acute phase of infection.  Positive  results are indicative of active infection with SARS-CoV-2.  Clinical  correlation with patient history and other diagnostic information is  necessary to determine patient infection status.  Positive results do  not rule out bacterial infection or co-infection with other viruses. If result is PRESUMPTIVE POSTIVE SARS-CoV-2 nucleic acids MAY BE PRESENT.   A presumptive positive result was obtained on the submitted specimen  and confirmed on repeat testing.  While 2019 novel coronavirus  (SARS-CoV-2) nucleic acids may be present in the submitted sample  additional confirmatory testing may be necessary for epidemiological  and / or clinical management purposes  to differentiate between  SARS-CoV-2 and other Sarbecovirus currently known to infect humans.  If clinically indicated additional testing with an alternate test  methodology [phone]) is advised. The SARS-CoV-2 RNA is generally  detectable in upper and lower respiratory sp ecimens during the acute  phase of infection. The expected result is Negative. Fact Sheet for Patients:   StrictlyIdeas.no Fact Sheet for Healthcare Providers: BankingDealers.co.za This test is not yet approved or cleared by the [place] FDA and has been authorized for detection and/or diagnosis of SARS-CoV-2 by FDA under an Emergency Use Authorization (EUA).  This EUA will remain in effect (meaning this test can be used) for the duration of the COVID-19 declaration under Section 564(b)(1) of the Act, 21 U.S.C. section 360bbb-3(b)(1), unless the authorization is terminated or revoked sooner. Performed at [facility], [place] [address], [place], [place] [address]   MRSA PCR  Screening     Status: None   Collection Time: 04/30/19  8:30 PM   Specimen: Nasal Mucosa; Nasopharyngeal  Result Value Ref Range Status   MRSA by PCR NEGATIVE NEGATIVE Final    Comment:        The GeneXpert MRSA Assay (FDA approved for NASAL specimens only), is one component of a comprehensive MRSA colonization surveillance program. It is not intended to diagnose MRSA infection nor to guide or monitor treatment for MRSA infections. Performed at [facility], [place] [address], [place], [place] [address]   Culture, blood (routine x 2)     Status: None (Preliminary result)   Collection Time: 04/30/19  8:35 PM   Specimen: BLOOD  Result Value Ref Range Status   Specimen Description BLOOD LEFT HAND  Final   Special Requests   Final    BOTTLES DRAWN AEROBIC ONLY Blood Culture results may not be optimal due to an inadequate volume of blood received in culture bottles   Culture   Final    NO GROWTH < 12 HOURS Performed at [facility], [place] [address] [place], [place] [address]    Report Status PENDING  Incomplete  Culture, blood (routine x 2)     Status: None (Preliminary result)   Collection Time: 04/30/19  8:58 PM   Specimen: BLOOD  Result Value Ref Range Status   Specimen Description BLOOD LEFT HAND  Final   Special Requests   Final    BOTTLES  DRAWN AEROBIC ONLY Blood Culture results may not be optimal due to an inadequate volume of blood received in culture bottles   Culture   Final    NO GROWTH < 12 HOURS Performed at [facility], [place] [address] [place], [place] [address]    Report Status PENDING  Incomplete      Radiology Studies: Dg Chest 1 View  Result Date: 04/30/2019 CLINICAL DATA:  Shortness of breath EXAM: CHEST  1 VIEW COMPARISON:  04/30/2019 FINDINGS: Bilateral interstitial and patchy alveolar airspace opacities. No pleural effusion or pneumothorax. Stable cardiomegaly. No acute osseous abnormality. Right-sided central venous catheter with the tip projecting over the cavoatrial junction. IMPRESSION: Findings concerning for mild pulmonary edema. Electronically Signed   By: Kathreen Devoid   On: 04/30/2019 19:55   Ct Head Wo Contrast  Result Date: 04/29/2019 CLINICAL DATA:  Seizure, new, nontraumatic, >40 yrs EXAM: CT HEAD WITHOUT CONTRAST TECHNIQUE: Contiguous axial images were obtained from the base of the skull through the vertex without intravenous contrast. COMPARISON:  Head CT 02/08/2018 FINDINGS: Brain: No intracranial hemorrhage, mass effect, or midline shift. Generalized atrophy. Unchanged chronic small vessel ischemia, moderate for age. No hydrocephalus. The basilar cisterns are patent. No evidence of territorial infarct or acute ischemia. No extra-axial or intracranial fluid collection. Vascular: Atherosclerosis of skullbase vasculature without hyperdense vessel or abnormal calcification. Skull: No fracture or focal lesion. Sinuses/Orbits: Paranasal sinuses and mastoid air cells are clear. The visualized orbits are unremarkable. Left cataract resection. Other: None. IMPRESSION: 1. No acute intracranial abnormality. 2. Unchanged atrophy and chronic small vessel ischemia. Electronically Signed   By: Keith Rake M.D.   On: 04/29/2019 22:33   Ct Angio Chest Pe W And/or Wo Contrast  Result Date:  04/30/2019 CLINICAL DATA:  PE suspected, high pretest prob Patient found unresponsive in bathroom, missed last 3 dialysis appointments. EXAM: CT ANGIOGRAPHY CHEST WITH CONTRAST TECHNIQUE: Multidetector CT imaging of the chest was performed using the standard protocol during bolus administration  of intravenous contrast. Multiplanar CT image reconstructions and MIPs were obtained to evaluate the vascular anatomy. CONTRAST:  23mL OMNIPAQUE IOHEXOL 350 MG/ML SOLN COMPARISON:  Radiograph yesterday. No prior chest CT. Lung bases from abdominal CT 02/08/2018 FINDINGS: Cardiovascular: There are no filling defects within the pulmonary arteries to suggest pulmonary embolus. Aortic atherosclerosis without aneurysm or dissection. Coronary artery calcifications. Mild cardiomegaly. No pericardial effusion. Mediastinum/Nodes: Enlarged lower paratracheal node at 14 mm, series 5, image 36. Enlarged right hilar node measuring 2.6 cm, image 50. Multiple additional mildly enlarged and prominent bilateral hilar and mediastinal nodes. Decompressed esophagus. Small hiatal hernia. High-density material in the distal esophagus and proximal stomach. No visualized thyroid nodule. Lungs/Pleura: Fine reticular and ground-glass opacities present in both lungs, right greater than left and more prominent at the lung bases. Similar reticular opacities seen on prior abdominal CT, however currently increased from that exam. Clustered calcified pulmonary nodules in the right lung. Mild irregular fissural thickening. No septal thickening or pleural fluid. Central bronchial thickening, trachea and mainstem bronchi are patent. Upper Abdomen: Small hiatal hernia. No acute findings. Musculoskeletal: There are no acute or suspicious osseous abnormalities. Review of the MIP images confirms the above findings. IMPRESSION: 1. No pulmonary embolus. 2. Fine reticular and ground-glass opacities in both lungs, right greater than left and more prominent at the lung  bases. Similar reticular opacities seen on 2019 abdominal CT, however currently increased from that exam. Suspect pulmonary edema or acute infection superimposed on chronic interstitial lung disease. Consider nonemergent high resolution chest CT for characterization. 3. Mild mediastinal and right hilar adenopathy is likely reactive, but nonspecific. 4. Aortic Atherosclerosis (ICD10-I70.0). Coronary artery calcifications. 5. Small hiatal hernia. Electronically Signed   By: Keith Rake M.D.   On: 04/30/2019 00:25   Dg Chest Port 1 View  Result Date: 04/30/2019 CLINICAL DATA:  Status post left heart catheterization EXAM: PORTABLE CHEST 1 VIEW COMPARISON:  04/29/2019 FINDINGS: Cardiac shadow is stable. Dialysis catheter is now seen in satisfactory position. Stable opacities are noted throughout both lungs similar to that seen on the prior exam likely related to mild pulmonary edema. No sizable effusion is seen. No pneumothorax is noted. IMPRESSION: Stable opacities bilaterally.  No new focal abnormality is seen. Electronically Signed   By: Inez Catalina M.D.   On: 04/30/2019 16:39   Dg Chest Portable 1 View  Result Date: 04/29/2019 CLINICAL DATA:  Dyspnea. Syncope. Found unresponsive in bathroom. Missed dialysis. EXAM: PORTABLE CHEST 1 VIEW COMPARISON:  Radiograph 12/10/2018 FINDINGS: Cardiomegaly is unchanged. Unchanged mediastinal contours. Fine reticular opacities throughout both lungs which are more prominent than on prior exam. Small right and possibly left pleural effusions. No confluent airspace disease. No pneumothorax. No acute osseous abnormalities. IMPRESSION: Cardiomegaly with pulmonary edema and small right and possibly left pleural effusions. Electronically Signed   By: Keith Rake M.D.   On: 04/29/2019 20:25   Hybrid Or Imaging (mc Only)  Result Date: 04/30/2019 There is no interpretation for this exam.  This order is for images obtained during a surgical procedure.  Please See  "Surgeries" Tab for more information regarding the procedure.    LOS: 2 days   Time spent: More than 50% of that time was spent in counseling and/or coordination of care.  Antonieta Pert, MD [facility]  05/01/2019, 3:33 PM

## 2019-05-01 NOTE — Progress Notes (Signed)
Stopped CRRT at this time. Returned blood to patient. Pt tolerated well. RN will continue to monitor.

## 2019-05-01 NOTE — Progress Notes (Signed)
NAME:  Blake [name], MRN:  [id number], DOB:  11-24-1945, LOS: 2 ADMISSION DATE:  04/29/2019, CONSULTATION DATE:  04/30/2019 REFERRING MD:  TRH, CHIEF COMPLAINT:  Syncope/ hypotension  Brief History   30 yoM ESRD admitted after syncopal episode and several missed dialysis sessions due to malfunctioning graft.  Cardiology seeing for NSTEMI, on heparin with plans for heart catheterrization after dialysis.  Went for placement of R TDC without complication and had acute change in mental status, hypotension, and brief agonal breathing without loss of pulses in PACU.  S/p narcan x 2.  Improvement in hemodynamics but some residual lethargy.  To be tranferred to ICU for CRRT overnight, PCCM consulted.   History of present illness   73 year old male with history of ESRD on iHD TTS, DM, HTN, HLD, osteomyelitis of right great toe s/p amputation initially presenting 8/16 with altered mental status and syncopal episode at home after missing several dialysis sessions due to vascular access problems, reported last iHD 04/24/2019, with episodes of vomiting.  Had revision of right arm AVF for ulceration on 8/11 by Dr. Trula Slade.  Initial labs noed for BUN 91, sCr 15.3, hs-trop 169, Hgb 11.4.  Initial EKG showed a right bundle branch block with ST depression V1 to V3 but changes resolved quickly.  CT head without acute findings. CTA PE negative for PE.  CXR howed some pulmonary edema and small effusions.  In ER, his mental status returned to baseline and hemodynamically stable and afebrile.  He was admitted to hospitalist service with cardiology and nephrology consulting.  He was started on IV heparin with plans for vascular to place tunneled dialysis catheter with dialysis and then cardiac catheterrization  in the morning on 8/18.      Patient had R TDC placed in IR without complications.  Reportedly received fentanyl 50 mcg and propofol for procedure and was at baseline but had sudden altered mental status with  hypotensive epidsode with systolic in the 0000000 and developed agonal breathing.  He never lost pulses, bagged briefly and given narcan x 2 with mental status and hemodynamic improvement.  Repeat pending labs, EKG still showing R BBB, and glucose 129.  PCCM consulted.  Nephrology wants to transfer to ICU for CRRT overnight given prior hemodynamic instability and close monitoring.   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 8/17 TTE  >>  EF 60-65%.  Micro Data:  8/16 SARS CoV-2 >> negative  Antimicrobials:  8/17 cefazolin preop  Interim history/subjective:  No complaints.  Wanting to eat.  Objective   Blood pressure (!) 168/63, pulse (!) 51, temperature (!) 97.4 F (36.3 C), temperature source Oral, resp. rate 18, height 5\' 8"  (1.727 m), weight 85.4 kg, SpO2 100 %.        Intake/Output Summary (Last 24 hours) at 05/01/2019 0800 Last data filed at 05/01/2019 0700 Gross per 24 hour  Intake 359.31 ml  Output 1270 ml  Net -910.69 ml   Filed Weights   04/29/19 1930 04/30/19 2000 05/01/19 0600  Weight: 86.2 kg 86.2 kg 85.4 kg    Examination: General: Adult male, resting in  bed, in NAD. Neuro: A&O x 3, no deficits.Marland Kitchen HEENT: [place]/AT. Sclerae anicteric. EOMI. Cardiovascular:  Loletha Grayer, regular, no M/R/G.  Lungs: Respirations even and unlabored.  CTA bilaterally, No W/R/R. Abdomen: BS x 4, soft, NT/ND.  Musculoskeletal: R great toe amputation. RUE AFV. Skin: Intact, warm, no rashes.   Assessment & Plan:   Acute encephalopathy / syncope - CT head neg.  Presumed oversedation following procedure.  Now resolved. P:  Avoid sedating meds.   Hypotension - resolved.  Presumed oversedation during / following procedure. P: Continue supportive care.  ESRD on iHD s/p R [facility] 8/17.  Transferred to ICU for CRRT overnight 8/17. P:  Per Nephrology.  Likely to refer back to HD now that hemodynamics are improved  NSTEMI. P:  Per cards, planning for cath today. Continue heparin gtt.  Acute hypoxic respiratory failure - presumed volume overload / edema.  CTA neg for PE. P: Volume removal per CRRT / HD.  DMT2. P:  SSI q 4.  As long as no complications following cath, can likely transfer out of ICU and back to [facility] this afternoon and resume normal HD schedule.  Best practice:  Diet: NPO for now. Pain/Anxiety/Delirium protocol (if indicated): n/a VAP protocol (if indicated): n/a DVT prophylaxis: heparin IV GI prophylaxis: n/a Glucose control: SSI q 4hr Mobility: BR Code Status: full  Family Communication: patient updated on plan of care.  Lives at home with his niece, [name]. Disposition: ICU for now.  Transfer to tele if no issues after cardiac cath.   Montey Hora, [place] [facility] Pager: [phone].  If no answer, [phone] 05/01/2019, 8:10 AM

## 2019-05-01 NOTE — Progress Notes (Signed)
Right arterial sheath removed per this RN and Cybil, RN. Manual pressure held for 22 minutes. Pt did develop at large hematoma, RN held pressure and pushed it out. Site level 1 with minimal bruising. Site dressed per protocol

## 2019-05-01 NOTE — Progress Notes (Signed)
[facility] Progress Note Patient Name: SHUNTA BRAIN DOB: [birth year] MRN: [id number]   Date of Service  05/01/2019  HPI/Events of Note  Patient now on diet - Request to change from Q 4 hour sensitive Novolog SII to AC/HS.  eICU Interventions  Will change to AC/HS sensitive SSI.     Intervention Category Major Interventions: Hyperglycemia - active titration of insulin therapy  Lysle Dingwall 05/01/2019, 7:44 PM

## 2019-05-01 NOTE — H&P (View-Only) (Signed)
Progress Note  Patient Name: Blake [name] Date of Encounter: 05/01/2019  Primary Cardiologist: Dr Stanford Breed  Subjective   No CP or dyspnea  Inpatient Medications    Scheduled Meds: . aspirin EC  81 mg Oral Daily  . atorvastatin  40 mg Oral QHS  . calcium acetate  667 mg Oral TID WC  . Chlorhexidine Gluconate Cloth  6 each Topical Daily  . insulin aspart  0-9 Units Subcutaneous Q4H  . mouth rinse  15 mL Mouth Rinse BID  . multivitamin  1 tablet Oral QHS  . pantoprazole  40 mg Oral BID   Continuous Infusions: .  prismasol BGK 4/2.5 500 mL/hr at 04/30/19 2052  .  prismasol BGK 4/2.5 500 mL/hr at 04/30/19 2052  . sodium chloride 10 mL/hr at 05/01/19 0700  . heparin 1,000 Units/hr (05/01/19 0700)  . prismasol BGK 4/2.5 1,500 mL/hr at 05/01/19 0423   PRN Meds: heparin   Vital Signs    Vitals:   05/01/19 0530 05/01/19 0600 05/01/19 0630 05/01/19 0700  BP: (!) 132/59 133/62 132/67 122/65  Pulse: (!) 57 (!) 55 (!) 57 (!) 54  Resp: 17 14 16  (!) 22  Temp:      TempSrc:      SpO2: 96% 97% 94% 95%  Weight:  85.4 kg    Height:        Intake/Output Summary (Last 24 hours) at 05/01/2019 0721 Last data filed at 05/01/2019 0700 Gross per 24 hour  Intake 359.31 ml  Output 1270 ml  Net -910.69 ml   Last 3 Weights 05/01/2019 04/30/2019 04/29/2019  Weight (lbs) 188 lb 4.4 oz 190 lb 0.6 oz 190 lb 0.6 oz  Weight (kg) 85.4 kg 86.2 kg 86.2 kg      Telemetry    Sinus - Personally Reviewed   Physical Exam   GEN: No acute distress.  On CRRT Neck: supple Cardiac: RRR, no murmurs, rubs, or gallops.  Respiratory: Clear to auscultation bilaterally. GI: Soft, nontender, non-distended  MS: No edema Neuro:  Nonfocal  Psych: Normal affect   Labs    High Sensitivity Troponin:   Recent Labs  Lab 04/29/19 1938 04/29/19 2220 04/30/19 0233 04/30/19 1839 05/01/19 0248  TROPONINIHS 169* 4,723* 18,435* 8,140* 4,035*      Chemistry Recent Labs  Lab 04/29/19 1938   04/30/19 0233 04/30/19 1839 05/01/19 0248  NA 143   < > 144 141 143  K 5.7*   < > 5.6* 4.8 5.0  CL 103   < > 107 106 107  CO2 15*  --  19* 16* 18*  GLUCOSE 211*   < > 125* 125* 107*  BUN 91*   < > 93* 92* 78*  CREATININE 15.20*   < > 15.37* 16.04* 12.93*  CALCIUM 8.5*  --  8.5* 8.2* 8.4*  PROT 7.1  --   --   --   --   ALBUMIN 3.1*  --  3.0*  --  3.2*  AST 443*  --   --   --   --   ALT 147*  --   --   --   --   ALKPHOS 168*  --   --   --   --   BILITOT 0.4  --   --   --   --   GFRNONAA 3*  --  3* 3* 3*  GFRAA 3*  --  3* 3* 4*  ANIONGAP 25*  --  18* 19* 18*   < > =  values in this interval not displayed.     Hematology Recent Labs  Lab 04/30/19 0233 04/30/19 1838 05/01/19 0248  WBC 8.6 6.4 8.4  RBC 3.81* 3.95* 4.01*  HGB 10.9* 11.3* 11.4*  HCT 34.8* 36.3* 36.6*  MCV 91.3 91.9 91.3  MCH 28.6 28.6 28.4  MCHC 31.3 31.1 31.1  RDW 14.7 15.0 14.8  PLT 180 147* 161    Radiology    Dg Chest 1 View  Result Date: 04/30/2019 CLINICAL DATA:  Shortness of breath EXAM: CHEST  1 VIEW COMPARISON:  04/30/2019 FINDINGS: Bilateral interstitial and patchy alveolar airspace opacities. No pleural effusion or pneumothorax. Stable cardiomegaly. No acute osseous abnormality. Right-sided central venous catheter with the tip projecting over the cavoatrial junction. IMPRESSION: Findings concerning for mild pulmonary edema. Electronically Signed   By: Kathreen Devoid   On: 04/30/2019 19:55   Ct Head Wo Contrast  Result Date: 04/29/2019 CLINICAL DATA:  Seizure, new, nontraumatic, >40 yrs EXAM: CT HEAD WITHOUT CONTRAST TECHNIQUE: Contiguous axial images were obtained from the base of the skull through the vertex without intravenous contrast. COMPARISON:  Head CT 02/08/2018 FINDINGS: Brain: No intracranial hemorrhage, mass effect, or midline shift. Generalized atrophy. Unchanged chronic small vessel ischemia, moderate for age. No hydrocephalus. The basilar cisterns are patent. No evidence of territorial  infarct or acute ischemia. No extra-axial or intracranial fluid collection. Vascular: Atherosclerosis of skullbase vasculature without hyperdense vessel or abnormal calcification. Skull: No fracture or focal lesion. Sinuses/Orbits: Paranasal sinuses and mastoid air cells are clear. The visualized orbits are unremarkable. Left cataract resection. Other: None. IMPRESSION: 1. No acute intracranial abnormality. 2. Unchanged atrophy and chronic small vessel ischemia. Electronically Signed   By: Keith Rake M.D.   On: 04/29/2019 22:33   Ct Angio Chest Pe W And/or Wo Contrast  Result Date: 04/30/2019 CLINICAL DATA:  PE suspected, high pretest prob Patient found unresponsive in bathroom, missed last 3 dialysis appointments. EXAM: CT ANGIOGRAPHY CHEST WITH CONTRAST TECHNIQUE: Multidetector CT imaging of the chest was performed using the standard protocol during bolus administration of intravenous contrast. Multiplanar CT image reconstructions and MIPs were obtained to evaluate the vascular anatomy. CONTRAST:  16mL OMNIPAQUE IOHEXOL 350 MG/ML SOLN COMPARISON:  Radiograph yesterday. No prior chest CT. Lung bases from abdominal CT 02/08/2018 FINDINGS: Cardiovascular: There are no filling defects within the pulmonary arteries to suggest pulmonary embolus. Aortic atherosclerosis without aneurysm or dissection. Coronary artery calcifications. Mild cardiomegaly. No pericardial effusion. Mediastinum/Nodes: Enlarged lower paratracheal node at 14 mm, series 5, image 36. Enlarged right hilar node measuring 2.6 cm, image 50. Multiple additional mildly enlarged and prominent bilateral hilar and mediastinal nodes. Decompressed esophagus. Small hiatal hernia. High-density material in the distal esophagus and proximal stomach. No visualized thyroid nodule. Lungs/Pleura: Fine reticular and ground-glass opacities present in both lungs, right greater than left and more prominent at the lung bases. Similar reticular opacities seen on  prior abdominal CT, however currently increased from that exam. Clustered calcified pulmonary nodules in the right lung. Mild irregular fissural thickening. No septal thickening or pleural fluid. Central bronchial thickening, trachea and mainstem bronchi are patent. Upper Abdomen: Small hiatal hernia. No acute findings. Musculoskeletal: There are no acute or suspicious osseous abnormalities. Review of the MIP images confirms the above findings. IMPRESSION: 1. No pulmonary embolus. 2. Fine reticular and ground-glass opacities in both lungs, right greater than left and more prominent at the lung bases. Similar reticular opacities seen on 2019 abdominal CT, however currently increased from that exam. Suspect pulmonary edema  or acute infection superimposed on chronic interstitial lung disease. Consider nonemergent high resolution chest CT for characterization. 3. Mild mediastinal and right hilar adenopathy is likely reactive, but nonspecific. 4. Aortic Atherosclerosis (ICD10-I70.0). Coronary artery calcifications. 5. Small hiatal hernia. Electronically Signed   By: Keith Rake M.D.   On: 04/30/2019 00:25   Dg Chest Port 1 View  Result Date: 04/30/2019 CLINICAL DATA:  Status post left heart catheterization EXAM: PORTABLE CHEST 1 VIEW COMPARISON:  04/29/2019 FINDINGS: Cardiac shadow is stable. Dialysis catheter is now seen in satisfactory position. Stable opacities are noted throughout both lungs similar to that seen on the prior exam likely related to mild pulmonary edema. No sizable effusion is seen. No pneumothorax is noted. IMPRESSION: Stable opacities bilaterally.  No new focal abnormality is seen. Electronically Signed   By: Inez Catalina M.D.   On: 04/30/2019 16:39   Dg Chest Portable 1 View  Result Date: 04/29/2019 CLINICAL DATA:  Dyspnea. Syncope. Found unresponsive in bathroom. Missed dialysis. EXAM: PORTABLE CHEST 1 VIEW COMPARISON:  Radiograph 12/10/2018 FINDINGS: Cardiomegaly is unchanged.  Unchanged mediastinal contours. Fine reticular opacities throughout both lungs which are more prominent than on prior exam. Small right and possibly left pleural effusions. No confluent airspace disease. No pneumothorax. No acute osseous abnormalities. IMPRESSION: Cardiomegaly with pulmonary edema and small right and possibly left pleural effusions. Electronically Signed   By: Keith Rake M.D.   On: 04/29/2019 20:25   Hybrid Or Imaging (mc Only)  Result Date: 04/30/2019 There is no interpretation for this exam.  This order is for images obtained during a surgical procedure.  Please See "Surgeries" Tab for more information regarding the procedure.    Patient Profile     73 y.o. male with past medical history of diabetes mellitus, hypertension, hyperlipidemia, end-stage renal disease dialysis dependent with non-ST elevation myocardial infarction and syncope.  Assessment & Plan    1 non-ST elevation myocardial infarction-plan to continue aspirin, heparin, statin and metoprolol.  Proceed with cardiac catheterization today.  The risks and benefits including myocardial infarction, CVA and death discussed and he agrees to proceed.    2 episode of hypotension/agonal breathing following dialysis catheter placement-etiology of events last evening unclear but no documented arrhythmia and by notes patient did not lose pulse.  Follow on telemetry. ? oversedation  3 syncope-occurred prior to admission and in the setting of having a bowel movement.  Description sounds like defecation syncope. Continue telemetry.  Echocardiogram shows normal LV function.  3 end-stage renal disease-patient had not been dialyzed for 1 week prior to admission.  He was volume overloaded and hyperkalemic.  He is now being dialyzed and nephrology following.  4 diabetes mellitus-Per primary care.  For questions or updates, please contact [facility] Please consult [email or web address] for contact info under        Signed,  Kirk Ruths, MD  05/01/2019, 7:21 AM

## 2019-05-02 ENCOUNTER — Encounter (HOSPITAL_COMMUNITY): Payer: Self-pay | Admitting: Cardiology

## 2019-05-02 LAB — CBC
HCT: 30.1 % — ABNORMAL LOW (ref 39.0–52.0)
Hemoglobin: 9.5 g/dL — ABNORMAL LOW (ref 13.0–17.0)
MCH: 29.1 pg (ref 26.0–34.0)
MCHC: 31.6 g/dL (ref 30.0–36.0)
MCV: 92 fL (ref 80.0–100.0)
Platelets: 141 10*3/uL — ABNORMAL LOW (ref 150–400)
RBC: 3.27 MIL/uL — ABNORMAL LOW (ref 4.22–5.81)
RDW: 14.9 % (ref 11.5–15.5)
WBC: 6.7 10*3/uL (ref 4.0–10.5)
nRBC: 0 % (ref 0.0–0.2)

## 2019-05-02 LAB — RENAL FUNCTION PANEL
Albumin: 2.6 g/dL — ABNORMAL LOW (ref 3.5–5.0)
Anion gap: 13 (ref 5–15)
BUN: 49 mg/dL — ABNORMAL HIGH (ref 8–23)
CO2: 21 mmol/L — ABNORMAL LOW (ref 22–32)
Calcium: 7.8 mg/dL — ABNORMAL LOW (ref 8.9–10.3)
Chloride: 105 mmol/L (ref 98–111)
Creatinine, Ser: 8.47 mg/dL — ABNORMAL HIGH (ref 0.61–1.24)
GFR calc Af Amer: 7 mL/min — ABNORMAL LOW (ref >60)
GFR calc non Af Amer: 6 mL/min — ABNORMAL LOW (ref >60)
Glucose, Bld: 156 mg/dL — ABNORMAL HIGH (ref 70–99)
Phosphorus: 4.9 mg/dL — ABNORMAL HIGH (ref 2.5–4.6)
Potassium: 4.4 mmol/L (ref 3.5–5.1)
Sodium: 139 mmol/L (ref 135–145)

## 2019-05-02 LAB — GLUCOSE, CAPILLARY
Glucose-Capillary: 193 mg/dL — ABNORMAL HIGH (ref 70–99)
Glucose-Capillary: 213 mg/dL — ABNORMAL HIGH (ref 70–99)
Glucose-Capillary: 164 mg/dL — ABNORMAL HIGH (ref 70–99)
Glucose-Capillary: 205 mg/dL — ABNORMAL HIGH (ref 70–99)
Glucose-Capillary: 154 mg/dL — ABNORMAL HIGH (ref 70–99)

## 2019-05-02 LAB — BASIC METABOLIC PANEL
Anion gap: 12 (ref 5–15)
BUN: 49 mg/dL — ABNORMAL HIGH (ref 8–23)
CO2: 22 mmol/L (ref 22–32)
Calcium: 7.8 mg/dL — ABNORMAL LOW (ref 8.9–10.3)
Chloride: 106 mmol/L (ref 98–111)
Creatinine, Ser: 8.52 mg/dL — ABNORMAL HIGH (ref 0.61–1.24)
GFR calc Af Amer: 7 mL/min — ABNORMAL LOW (ref >60)
GFR calc non Af Amer: 6 mL/min — ABNORMAL LOW (ref >60)
Glucose, Bld: 154 mg/dL — ABNORMAL HIGH (ref 70–99)
Potassium: 4.5 mmol/L (ref 3.5–5.1)
Sodium: 140 mmol/L (ref 135–145)

## 2019-05-02 LAB — MAGNESIUM: Magnesium: 2.1 mg/dL (ref 1.7–2.4)

## 2019-05-02 MED ORDER — HEPARIN SODIUM (PORCINE) 5000 UNIT/ML IJ SOLN
5000.0000 [IU] | Freq: Three times a day (TID) | INTRAMUSCULAR | Status: DC
  Administered 2019-05-02 – 2019-05-06 (×12): 5000 [IU] via SUBCUTANEOUS
  Filled 2019-05-02 (×12): qty 1

## 2019-05-02 MED ORDER — CHLORHEXIDINE GLUCONATE CLOTH 2 % EX PADS
6.0000 | MEDICATED_PAD | Freq: Every day | CUTANEOUS | Status: DC
  Administered 2019-05-02: 12:00:00 6 via TOPICAL

## 2019-05-02 MED ORDER — HEPARIN SODIUM (PORCINE) 1000 UNIT/ML IJ SOLN
INTRAMUSCULAR | Status: AC
  Administered 2019-05-02: 15:00:00
  Filled 2019-05-02: qty 3

## 2019-05-02 MED ORDER — HEPARIN SODIUM (PORCINE) 1000 UNIT/ML DIALYSIS
3000.0000 [IU] | INTRAMUSCULAR | Status: DC | PRN
  Filled 2019-05-02: qty 3

## 2019-05-02 MED FILL — Verapamil HCl IV Soln 2.5 MG/ML: INTRAVENOUS | Qty: 2 | Status: AC

## 2019-05-02 NOTE — Progress Notes (Signed)
PCCM Interval Progress Note  Vitals stable. Off CRRT since 7pm last night.  Planning to start back with HD today and transfer out of ICU.   Nothing further to add.  PCCM will sign off.  Please do not hesitate to call us back if we can be of any further assistance.    Montey Hora, [place] [facility] Pager: [phone].  If no answer, [phone] 05/02/2019, 7:34 AM

## 2019-05-02 NOTE — Progress Notes (Signed)
PT Cancellation Note  Patient Details Name: Blake [name] MRN: [id number] DOB: [birth year]   Cancelled Treatment:    Reason Eval/Treat Not Completed: Patient declined, no reason specified Pt initially agreeable, and after setting up room and getting home information, pt reports he was not participating with therapy today. Will follow up as schedule allows.   Leighton Ruff, PT, DPT  Acute Rehabilitation Services  Pager: [phone] Office: [phone]    Rudean Hitt 05/02/2019, 5:11 PM

## 2019-05-02 NOTE — Progress Notes (Signed)
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 Chest x-ray revealed cardiomegaly with pulmonary edema and small right and possible left pleural effusions.  Patient had episode of unresponsiveness while being transferred to his room following his HD catheter placement in OR-got Narcan x2, blood pressure was in 60s, never lost his pulse.  Following this event he was transferred to ICU where he had CRRT overnight.  Subjective: -No events overnight, feels well today, has not ambulated yet  Assessment & Plan: 73 year old male with ESRD on HD, T2DM, hypertension, dyslipidemia recent partial resection and plication of ulcerated aneurysmal right brachiocephalic fistula admitted with episode of syncope and found to have positive troponin in the ER.  Syncopal episode:  -Likely secondary to hypotension.non-STEMI also contributing  -ambulate with PT today   NSTEMI: -Appreciate cardiology input - status post cardiac cath with DES stent in left circumflex and LAD -Continue DAPT x1 year, Lipitor, metoprolol -Ambulate today, discharge planning  ESRD on HD TTHS- last HD on last Tuesday -. Missed 2 prior HD, nephrology on board, he did with CRRT yesterday  -Plan for HD, nephrology following  Fluid overload/pulmonary edema Acute, from missed dialysis:  -Resolved, recently treated with CRRT, plan for HD today  HyperKalemia:resolved.   Type 2 diabetes mellitus: Blood sugar continue diet, continue sliding scale insulin.  Hypertension: BP not antihypertensive due to previous orthostatic hypotension  Dyslipidemia: on Lipitor  Body mass index is 29 kg/m.   DVT prophylaxis:Heparin Code Status: full Family Communication:Plan of care discussed with patient in detail.   Disposition Plan: As for out of ICU, home tomorrow if stable  Consultants:  Nephrology  Cardiology  Procedures: HD today  CTA  1. No pulmonary embolus. 2. Fine reticular and ground-glass opacities in both lungs, right greater than left and more prominent at the lung bases. Similar reticular opacities seen on 2019 abdominal CT, however currently increased from that exam. Suspect pulmonary edema or acute infection superimposed on chronic interstitial lung disease. Consider nonemergent high resolution chest CT for characterization. 3. Mild mediastinal and right hilar adenopathy is likely reactive, but nonspecific. 4. Aortic Atherosclerosis (ICD10-I70.0). Coronary artery calcifications. 5. Small hiatal hernia. Microbiology:  Antimicrobials: Anti-infectives (From admission, onward)   None       Objective: Vitals:   05/02/19 1100 05/02/19 1155 05/02/19 1201 05/02/19 1205  BP: (!) 117/58 134/64 136/60 (!) 101/49  Pulse: 71  70 70  Resp: 16 16 (!) 25 18  Temp:  98 F (36.7 C)    TempSrc:  Oral    SpO2: 99%  97% 96%  Weight:  86.5 kg    Height:        Intake/Output Summary (Last 24 hours) at 05/02/2019 1239 Last data filed at 05/02/2019 1000 Gross per 24 hour  Intake 820 ml  Output 1664 ml  Net -844 ml   Filed Weights   05/01/19 0600 05/02/19 0500 05/02/19 1155  Weight: 85.4 kg 85.2 kg 86.5 kg   Weight change: -1 kg  Body mass index is 29  kg/m.  Intake/Output from previous day: 08/18 0701 - 08/19 0700 In: 603.3 [P.O.:360; I.V.:243.3] Out: 2053  Intake/Output this shift: Total I/O In: 270 [P.O.:240; I.V.:30] Out: -    Examination:  Gen: Awake, Alert, Oriented X 3, no distress HEENT: PERRLA, Neck supple, no JVD, right IJ permacath noted Lungs: Clear bilaterally CVS: RRR,No Gallops,Rubs or new Murmurs Abd: soft, Non tender, non distended, BS present Extremities: Right great toe amputated, right upper arm AV fistula Skin: no new rashes  Medications:  Scheduled Meds: . aspirin EC  81 mg Oral Daily  . atorvastatin  40 mg Oral QHS  . calcium acetate  1,334 mg Oral TID WC  . Chlorhexidine Gluconate Cloth  6 each Topical Daily  . heparin injection (subcutaneous)  5,000 Units Subcutaneous Q8H  . insulin aspart  0-5 Units Subcutaneous QHS  . insulin aspart  0-9 Units Subcutaneous TID WC  . mouth rinse  15 mL Mouth Rinse BID  . metoprolol tartrate  12.5 mg Oral BID  . multivitamin  1 tablet Oral QHS  . pantoprazole  40 mg Oral BID  . sodium chloride flush  3 mL Intravenous Q12H  . sodium chloride flush  3 mL Intravenous Q12H  . ticagrelor  90 mg Oral BID   Continuous Infusions: . sodium chloride 10 mL/hr at 05/01/19 1200  . sodium chloride      Data Reviewed: I have personally reviewed following labs and imaging studies  CBC: Recent Labs  Lab 04/29/19 1938 04/29/19 1950 04/30/19 0233 04/30/19 1838 05/01/19 0248 05/02/19 0232  WBC 7.7  --  8.6 6.4 8.4 6.7  NEUTROABS 5.3  --   --   --   --   --   HGB 11.4* 12.6* 10.9* 11.3* 11.4* 9.5*  HCT 37.5* 37.0* 34.8* 36.3* 36.6* 30.1*  MCV 93.8  --  91.3 91.9 91.3 92.0  PLT 180  --  180 147* 161 [id number]*   Basic Metabolic Panel: Recent Labs  Lab 04/29/19 1938  04/30/19 0233 04/30/19 1839 05/01/19 0248 05/01/19 1550 05/02/19 0232  NA 143   < > 144 141 143 138 140  139  K 5.7*   < > 5.6* 4.8 5.0 4.8 4.5  4.4  CL 103   < > 107 106 107 104 106  105  CO2 15*  --  19* 16* 18* 21* 22  21*  GLUCOSE 211*   < > 125* 125* 107* 103* 154*  156*  BUN 91*   < > 93* 92* 78* 46* 49*  49*  CREATININE 15.20*   < > 15.37* 16.04* 12.93* 8.17* 8.52*   8.47*  CALCIUM 8.5*  --  8.5* 8.2* 8.4* 8.1* 7.8*  7.8*  MG 2.2  --  2.2  --  2.2  --  2.1  PHOS  --   --  8.6*  --  6.8* 4.4 4.9*   < > = values in this interval not displayed.   GFR: Estimated Creatinine Clearance: 8.6 mL/min (A) (by C-G formula based on SCr of 8.47 mg/dL (H)). Liver Function Tests: Recent Labs  Lab 04/29/19 1938 04/30/19 0233 05/01/19 0248 05/01/19 1550 05/02/19 0232  AST 443*  --   --   --   --   ALT 147*  --   --   --   --   ALKPHOS 168*  --   --   --   --   BILITOT 0.4  --   --   --   --  PROT 7.1  --   --   --   --   ALBUMIN 3.1* 3.0* 3.2* 3.0* 2.6*   No results for input(s): LIPASE, AMYLASE in the last 168 hours. No results for input(s): AMMONIA in the last 168 hours. Coagulation Profile: No results for input(s): INR, PROTIME in the last 168 hours. Cardiac Enzymes: No results for input(s): CKTOTAL, CKMB, CKMBINDEX, TROPONINI in the last 168 hours. BNP (last 3 results) No results for input(s): PROBNP in the last 8760 hours. HbA1C: No results for input(s): HGBA1C in the last 72 hours. CBG: Recent Labs  Lab 05/01/19 1356 05/01/19 1518 05/01/19 2211 05/02/19 0646 05/02/19 1020  GLUCAP 81 108* 151* 193* 213*   Lipid Profile: No results for input(s): CHOL, HDL, LDLCALC, TRIG, CHOLHDL, LDLDIRECT in the last 72 hours. Thyroid Function Tests: Recent Labs    04/30/19 0233  TSH 0.436   Anemia Panel: No results for input(s): VITAMINB12, FOLATE, FERRITIN, TIBC, IRON, RETICCTPCT in the last 72 hours. Sepsis Labs: No results for input(s): PROCALCITON, LATICACIDVEN in the last 168 hours.  Recent Results (from the past 240 hour(s))  SARS Coronavirus 2 [facility] order, Performed in [facility] hospital lab) Nasopharyngeal Nasopharyngeal Swab     Status: None   Collection Time: 04/24/19 12:08 PM   Specimen: Nasopharyngeal Swab  Result Value Ref Range Status   SARS Coronavirus 2 NEGATIVE NEGATIVE Final    Comment: (NOTE) If result is NEGATIVE  SARS-CoV-2 target nucleic acids are NOT DETECTED. The SARS-CoV-2 RNA is generally detectable in upper and lower  respiratory specimens during the acute phase of infection. The lowest  concentration of SARS-CoV-2 viral copies this assay can detect is 250  copies / mL. A negative result does not preclude SARS-CoV-2 infection  and should not be used as the sole basis for treatment or other  patient management decisions.  A negative result may occur with  improper specimen collection / handling, submission of specimen other  than nasopharyngeal swab, presence of viral mutation(s) within the  areas targeted by this assay, and inadequate number of viral copies  (<250 copies / mL). A negative result must be combined with clinical  observations, patient history, and epidemiological information. If result is POSITIVE SARS-CoV-2 target nucleic acids are DETECTED. The SARS-CoV-2 RNA is generally detectable in upper and lower  respiratory specimens dur ing the acute phase of infection.  Positive  results are indicative of active infection with SARS-CoV-2.  Clinical  correlation with patient history and other diagnostic information is  necessary to determine patient infection status.  Positive results do  not rule out bacterial infection or co-infection with other viruses. If result is PRESUMPTIVE POSTIVE SARS-CoV-2 nucleic acids MAY BE PRESENT.   A presumptive positive result was obtained on the submitted specimen  and confirmed on repeat testing.  While 2019 novel coronavirus  (SARS-CoV-2) nucleic acids may be present in the submitted sample  additional confirmatory testing may be necessary for epidemiological  and / or clinical management purposes  to differentiate between  SARS-CoV-2 and other Sarbecovirus currently known to infect humans.  If clinically indicated additional testing with an alternate test  methodology [id number]) is advised. The SARS-CoV-2 RNA is generally  detectable in upper  and lower respiratory sp ecimens during the acute  phase of infection. The expected result is Negative. Fact Sheet for Patients:  StrictlyIdeas.no Fact Sheet for Healthcare Providers: BankingDealers.co.za This test is not yet approved or cleared by the [place] FDA and has been authorized for detection  and/or diagnosis of SARS-CoV-2 by FDA under an Emergency Use Authorization (EUA).  This EUA will remain in effect (meaning this test can be used) for the duration of the COVID-19 declaration under Section 564(b)(1) of the Act, 21 U.S.C. section 360bbb-3(b)(1), unless the authorization is terminated or revoked sooner. Performed at [facility], [place] [address], [place], [place] [address]   SARS Coronavirus 2 [facility] order, Performed in [facility] hospital lab) Nasopharyngeal Nasopharyngeal Swab     Status: None   Collection Time: 04/29/19  7:22 PM   Specimen: Nasopharyngeal Swab  Result Value Ref Range Status   SARS Coronavirus 2 NEGATIVE NEGATIVE Final    Comment: (NOTE) If result is NEGATIVE SARS-CoV-2 target nucleic acids are NOT DETECTED. The SARS-CoV-2 RNA is generally detectable in upper and lower  respiratory specimens during the acute phase of infection. The lowest  concentration of SARS-CoV-2 viral copies this assay can detect is 250  copies / mL. A negative result does not preclude SARS-CoV-2 infection  and should not be used as the sole basis for treatment or other  patient management decisions.  A negative result may occur with  improper specimen collection / handling, submission of specimen other  than nasopharyngeal swab, presence of viral mutation(s) within the  areas targeted by this assay, and inadequate number of viral copies  (<250 copies / mL). A negative result must be combined with clinical  observations, patient history, and epidemiological information. If result is POSITIVE SARS-CoV-2 target nucleic  acids are DETECTED. The SARS-CoV-2 RNA is generally detectable in upper and lower  respiratory specimens dur ing the acute phase of infection.  Positive  results are indicative of active infection with SARS-CoV-2.  Clinical  correlation with patient history and other diagnostic information is  necessary to determine patient infection status.  Positive results do  not rule out bacterial infection or co-infection with other viruses. If result is PRESUMPTIVE POSTIVE SARS-CoV-2 nucleic acids MAY BE PRESENT.   A presumptive positive result was obtained on the submitted specimen  and confirmed on repeat testing.  While 2019 novel coronavirus  (SARS-CoV-2) nucleic acids may be present in the submitted sample  additional confirmatory testing may be necessary for epidemiological  and / or clinical management purposes  to differentiate between  SARS-CoV-2 and other Sarbecovirus currently known to infect humans.  If clinically indicated additional testing with an alternate test  methodology [phone]) is advised. The SARS-CoV-2 RNA is generally  detectable in upper and lower respiratory sp ecimens during the acute  phase of infection. The expected result is Negative. Fact Sheet for Patients:  StrictlyIdeas.no Fact Sheet for Healthcare Providers: BankingDealers.co.za This test is not yet approved or cleared by the [place] FDA and has been authorized for detection and/or diagnosis of SARS-CoV-2 by FDA under an Emergency Use Authorization (EUA).  This EUA will remain in effect (meaning this test can be used) for the duration of the COVID-19 declaration under Section 564(b)(1) of the Act, 21 U.S.C. section 360bbb-3(b)(1), unless the authorization is terminated or revoked sooner. Performed at [facility], [place] [address] [place], [place] [address]   MRSA PCR Screening     Status: None   Collection Time: 04/30/19  8:30 PM   Specimen: Nasal  Mucosa; Nasopharyngeal  Result Value Ref Range Status   MRSA by PCR NEGATIVE NEGATIVE Final    Comment:        The GeneXpert MRSA Assay (FDA approved for NASAL specimens only), is one component  of a comprehensive MRSA colonization surveillance program. It is not intended to diagnose MRSA infection nor to guide or monitor treatment for MRSA infections. Performed at [facility], [place] [address], [place], [place] [address]   Culture, blood (routine x 2)     Status: None (Preliminary result)   Collection Time: 04/30/19  8:35 PM   Specimen: BLOOD  Result Value Ref Range Status   Specimen Description BLOOD LEFT HAND  Final   Special Requests   Final    BOTTLES DRAWN AEROBIC ONLY Blood Culture results may not be optimal due to an inadequate volume of blood received in culture bottles   Culture   Final    NO GROWTH 2 DAYS Performed at [facility], [place] [address], [place], [place] [address]    Report Status PENDING  Incomplete  Culture, blood (routine x 2)     Status: None (Preliminary result)   Collection Time: 04/30/19  8:58 PM   Specimen: BLOOD  Result Value Ref Range Status   Specimen Description BLOOD LEFT HAND  Final   Special Requests   Final    BOTTLES DRAWN AEROBIC ONLY Blood Culture results may not be optimal due to an inadequate volume of blood received in culture bottles   Culture   Final    NO GROWTH 2 DAYS Performed at [facility], [place] [address] [place],  [address]    Report Status PENDING  Incomplete      Radiology Studies: Dg Chest 1 View  Result Date: 04/30/2019 CLINICAL DATA:  Shortness of breath EXAM: CHEST  1 VIEW COMPARISON:  04/30/2019 FINDINGS: Bilateral interstitial and patchy alveolar airspace opacities. No pleural effusion or pneumothorax. Stable cardiomegaly. No acute osseous abnormality. Right-sided central venous catheter with the tip projecting over the cavoatrial junction. IMPRESSION: Findings concerning for mild  pulmonary edema. Electronically Signed   By: Kathreen Devoid   On: 04/30/2019 19:55   Dg Chest Port 1 View  Result Date: 04/30/2019 CLINICAL DATA:  Status post left heart catheterization EXAM: PORTABLE CHEST 1 VIEW COMPARISON:  04/29/2019 FINDINGS: Cardiac shadow is stable. Dialysis catheter is now seen in satisfactory position. Stable opacities are noted throughout both lungs similar to that seen on the prior exam likely related to mild pulmonary edema. No sizable effusion is seen. No pneumothorax is noted. IMPRESSION: Stable opacities bilaterally.  No new focal abnormality is seen. Electronically Signed   By: Inez Catalina M.D.   On: 04/30/2019 16:39   Hybrid Or Imaging (mc Only)  Result Date: 04/30/2019 There is no interpretation for this exam.  This order is for images obtained during a surgical procedure.  Please See "Surgeries" Tab for more information regarding the procedure.    LOS: 3 days   Time spent: 70min,  More than 50% of that time was spent in counseling and/or coordination of care.  Domenic Polite, MD [facility]  05/02/2019, 12:39 PM

## 2019-05-02 NOTE — Progress Notes (Signed)
CARDIAC REHAB PHASE I   PRE:  Rate/Rhythm: 78 SR    BP: lying 116/69, sitting 114/71    SaO2: 89-93 1/4L, 86-91 RA EOB  MODE:  Ambulation: stood, sat, stood and pivoted to recliner   POST:  Rate/Rhythm: 87 SR    BP: sitting 100/80 after first stand and sit        Sitting 86/50 after standing and stepping to recliner     SaO2: 86-93 RA  Pt dizzy upon sitting on EOB (hadn't been up this admit). Sat on EOB 8 min. SaO2 fluctuating on RA, mostly high 80s. Pt sts he has had O2 at home but the tanks were too heavy to get to his truck so "they took it away".   Pt very dizzy standing, not able to put in to words how he felt. Sat again, BP down slightly. Moved recliner close and pt stood and pivoted to recliner. BP 86/50 after sitting. SaO2 continued to fluctuate but mostly high 80s. Applied 1L and is now 98 1L in recliner.   Pt not ready for d/c after HD. He needs PT c/s to eval home needs. We will f/u tomorrow for another attempt at ambulation. Pt seems to struggle with decision making and rationale. Chart says no driving for 6 months. Pt will probably not adhere to that. I did discuss his stent card, MI, and CRPII. Will put in referral to [facility].  [id number]   [place], ACSM 05/02/2019 11:01 AM

## 2019-05-02 NOTE — Progress Notes (Signed)
Progress Note  Patient Name: Blake [name] Date of Encounter: 05/02/2019  Primary Cardiologist: Dr Stanford Breed  Subjective   Pt denies CP or dyspnea  Inpatient Medications    Scheduled Meds:  aspirin EC  81 mg Oral Daily   atorvastatin  40 mg Oral QHS   calcium acetate  1,334 mg Oral TID WC   Chlorhexidine Gluconate Cloth  6 each Topical Daily   insulin aspart  0-5 Units Subcutaneous QHS   insulin aspart  0-9 Units Subcutaneous TID WC   mouth rinse  15 mL Mouth Rinse BID   metoprolol tartrate  12.5 mg Oral BID   multivitamin  1 tablet Oral QHS   pantoprazole  40 mg Oral BID   sodium chloride flush  3 mL Intravenous Q12H   sodium chloride flush  3 mL Intravenous Q12H   ticagrelor  90 mg Oral BID   Continuous Infusions:  sodium chloride 10 mL/hr at 05/01/19 1200   sodium chloride     PRN Meds: sodium chloride, heparin, sodium chloride flush   Vital Signs    Vitals:   05/02/19 0300 05/02/19 0500 05/02/19 0600 05/02/19 0645  BP: 119/62 127/73 (!) 124/53   Pulse: 70 67 66   Resp: 15 (!) 21 (!) 24   Temp:    98.8 F (37.1 C)  TempSrc:    Oral  SpO2: 99% 99% 100%   Weight:  85.2 kg    Height:        Intake/Output Summary (Last 24 hours) at 05/02/2019 0649 Last data filed at 05/01/2019 2300 Gross per 24 hour  Intake 489.98 ml  Output 2221 ml  Net -1731.02 ml   Last 3 Weights 05/02/2019 05/01/2019 04/30/2019  Weight (lbs) 187 lb 13.3 oz 188 lb 4.4 oz 190 lb 0.6 oz  Weight (kg) 85.2 kg 85.4 kg 86.2 kg      Telemetry    Sinus - Personally Reviewed   Physical Exam   GEN: NAD Neck: No JVD Cardiac: RRR Respiratory: CTA GI: Soft, NT/ND MS: No edema Neuro:  Grossly intact Psych: Normal affect   Labs    High Sensitivity Troponin:   Recent Labs  Lab 04/29/19 1938 04/29/19 2220 04/30/19 0233 04/30/19 1839 05/01/19 0248  TROPONINIHS 169* 4,723* 18,435* 8,140* 4,035*      Chemistry Recent Labs  Lab 04/29/19 1938  05/01/19 0248  05/01/19 1550 05/02/19 0232  NA 143   < > 143 138 140   139  K 5.7*   < > 5.0 4.8 4.5   4.4  CL 103   < > 107 104 106   105  CO2 15*   < > 18* 21* 22   21*  GLUCOSE 211*   < > 107* 103* 154*   156*  BUN 91*   < > 78* 46* 49*   49*  CREATININE 15.20*   < > 12.93* 8.17* 8.52*   8.47*  CALCIUM 8.5*   < > 8.4* 8.1* 7.8*   7.8*  PROT 7.1  --   --   --   --   ALBUMIN 3.1*   < > 3.2* 3.0* 2.6*  AST 443*  --   --   --   --   ALT 147*  --   --   --   --   ALKPHOS 168*  --   --   --   --   BILITOT 0.4  --   --   --   --  GFRNONAA 3*   < > 3* 6* 6*   6*  GFRAA 3*   < > 4* 7* 7*   7*  ANIONGAP 25*   < > 18* 13 12   13    < > = values in this interval not displayed.     Hematology Recent Labs  Lab 04/30/19 1838 05/01/19 0248 05/02/19 0232  WBC 6.4 8.4 6.7  RBC 3.95* 4.01* 3.27*  HGB 11.3* 11.4* 9.5*  HCT 36.3* 36.6* 30.1*  MCV 91.9 91.3 92.0  MCH 28.6 28.4 29.1  MCHC 31.1 31.1 31.6  RDW 15.0 14.8 14.9  PLT 147* 161 141*    Radiology    Dg Chest 1 View  Result Date: 04/30/2019 CLINICAL DATA:  Shortness of breath EXAM: CHEST  1 VIEW COMPARISON:  04/30/2019 FINDINGS: Bilateral interstitial and patchy alveolar airspace opacities. No pleural effusion or pneumothorax. Stable cardiomegaly. No acute osseous abnormality. Right-sided central venous catheter with the tip projecting over the cavoatrial junction. IMPRESSION: Findings concerning for mild pulmonary edema. Electronically Signed   By: Kathreen Devoid   On: 04/30/2019 19:55   Dg Chest Port 1 View  Result Date: 04/30/2019 CLINICAL DATA:  Status post left heart catheterization EXAM: PORTABLE CHEST 1 VIEW COMPARISON:  04/29/2019 FINDINGS: Cardiac shadow is stable. Dialysis catheter is now seen in satisfactory position. Stable opacities are noted throughout both lungs similar to that seen on the prior exam likely related to mild pulmonary edema. No sizable effusion is seen. No pneumothorax is noted. IMPRESSION: Stable opacities  bilaterally.  No new focal abnormality is seen. Electronically Signed   By: Inez Catalina M.D.   On: 04/30/2019 16:39   Hybrid Or Imaging (mc Only)  Result Date: 04/30/2019 There is no interpretation for this exam.  This order is for images obtained during a surgical procedure.  Please See "Surgeries" Tab for more information regarding the procedure.    Patient Profile     73 y.o. male with past medical history of diabetes mellitus, hypertension, hyperlipidemia, end-stage renal disease dialysis dependent with non-ST elevation myocardial infarction and syncope.  Assessment & Plan    1 non-ST elevation myocardial infarction-patient is status post PCI of left circumflex and LAD.  Continue aspirin, Brilinta, metoprolol and Lipitor.    2 syncope-previous episode occurred in the setting of having a bowel movement and felt to be likely defecation syncope.  No significant arrhythmias on telemetry.  LV function normal.  Can consider an event monitor in the future for recurrent episodes.  Patient restricted from driving for 6 months.    3 end-stage renal disease-dialysis per nephrology.  4 diabetes mellitus-Per primary care.  [facility] will sign off.   Medication Recommendations: Continue present medications as listed in MAR. Other recommendations (labs, testing, etc): No further cardiac testing Follow up as an outpatient: 2 weeks transition of care appointment with APP.  Follow-up with me 3 months.  For questions or updates, please contact [place] Please consult [email or web address] for contact info under        Signed, Kirk Ruths, MD  05/02/2019, 6:49 AM

## 2019-05-02 NOTE — Progress Notes (Signed)
Report given to [place], RN on 3W.

## 2019-05-02 NOTE — Progress Notes (Signed)
Subjective:  Feeling better overall, no cough or SOB.   Objective Vital signs in last 24 hours: Vitals:   05/02/19 1013 05/02/19 1031 05/02/19 1035 05/02/19 1042  BP: 114/71 (!) 100/51 (!) 86/50 114/61  Pulse: 83 88 85 63  Resp: (!) 27 (!) 21  (!) 22  Temp:      TempSrc:      SpO2: 96% 100% (!) 89% 97%  Weight:      Height:       Weight change: -1 kg  Physical Exam: General: alert calm NAD chronically ill male in 2 H  Heart: RRR, no m,r,g Lungs: CTA ,Non labored breathing  Abdomen:obese, soft , NT, ND Extremities: no pedal edema , R grt toe amp site dry /healed Dialysis Access:  R IJ Perm cath patent on CRRT currently , R UA AVF pos bruit/thrilll  /stable surgical site at recent plication of avf   [facility]  4h  83kg kg (last 2 txs below edw )  2K/3.5CA  Hep 6000 RFA AVF     Hec 2 mcg IV/HD Venofer 50mg  q wkly   No ESA    Assessment/Plan 1. Pulmonary  Edema/ vol overload due to missed hd / also in setting of NSTEMI - improving, 3.2 L off w/ CRRT, still up 2kg by wts 2. NSTEMI / CAD- w/ ^^trop's, sp LHC on 8/18 w/ stent x 2 to LCX and prox LAD 3. ESRD - on HD TTS. Had short run on CRRT, now plan short HD today, then regular HD tomorrow.  4. Malfunctioning R AVF - Sp plication [id number], unable to access AVF for now. VVS placed R IJ Perm cath 8/17. Has seen and noted (" Will arrange follow-up in 4 weeks eval avf" 5. Hypertension/volume  - no ^vol on exam , Bp stable ,no op meds 6. Syncope = wu per admit  7. Anemia  - hgb 11.4  No OP esa , weekly Fe on HD  8. Metabolic bone disease -  Hectorol  On hd and binders 9. DM Type 2 per admit  Kelly Splinter, MD 05/02/2019, 11:33 AM        Labs: Basic Metabolic Panel: Recent Labs  Lab 05/01/19 0248 05/01/19 1550 05/02/19 0232  NA 143 138 140  139  K 5.0 4.8 4.5  4.4  CL 107 104 106  105  CO2 18* 21* 22  21*  GLUCOSE 107* 103* 154*  156*  BUN 78* 46* 49*  49*  CREATININE 12.93* 8.17* 8.52*  8.47*   CALCIUM 8.4* 8.1* 7.8*  7.8*  PHOS 6.8* 4.4 4.9*   Liver Function Tests: Recent Labs  Lab 04/29/19 1938  05/01/19 0248 05/01/19 1550 05/02/19 0232  AST 443*  --   --   --   --   ALT 147*  --   --   --   --   ALKPHOS 168*  --   --   --   --   BILITOT 0.4  --   --   --   --   PROT 7.1  --   --   --   --   ALBUMIN 3.1*   < > 3.2* 3.0* 2.6*   < > = values in this interval not displayed.   No results for input(s): LIPASE, AMYLASE in the last 168 hours. No results for input(s): AMMONIA in the last 168 hours. CBC: Recent Labs  Lab 04/29/19 1938  04/30/19 0233 04/30/19 1838 05/01/19 0248 05/02/19  0232  WBC 7.7  --  8.6 6.4 8.4 6.7  NEUTROABS 5.3  --   --   --   --   --   HGB 11.4*   < > 10.9* 11.3* 11.4* 9.5*  HCT 37.5*   < > 34.8* 36.3* 36.6* 30.1*  MCV 93.8  --  91.3 91.9 91.3 92.0  PLT 180  --  180 147* 161 141*   < > = values in this interval not displayed.   Cardiac Enzymes: No results for input(s): CKTOTAL, CKMB, CKMBINDEX, TROPONINI in the last 168 hours. CBG: Recent Labs  Lab 05/01/19 1356 05/01/19 1518 05/01/19 2211 05/02/19 0646 05/02/19 1020  GLUCAP 81 108* 151* 193* 213*    Medications: . sodium chloride 10 mL/hr at 05/01/19 1200  . sodium chloride     . aspirin EC  81 mg Oral Daily  . atorvastatin  40 mg Oral QHS  . calcium acetate  1,334 mg Oral TID WC  . Chlorhexidine Gluconate Cloth  6 each Topical Daily  . heparin injection (subcutaneous)  5,000 Units Subcutaneous Q8H  . insulin aspart  0-5 Units Subcutaneous QHS  . insulin aspart  0-9 Units Subcutaneous TID WC  . mouth rinse  15 mL Mouth Rinse BID  . metoprolol tartrate  12.5 mg Oral BID  . multivitamin  1 tablet Oral QHS  . pantoprazole  40 mg Oral BID  . sodium chloride flush  3 mL Intravenous Q12H  . sodium chloride flush  3 mL Intravenous Q12H  . ticagrelor  90 mg Oral BID

## 2019-05-03 ENCOUNTER — Inpatient Hospital Stay (HOSPITAL_COMMUNITY): Payer: Medicare Other

## 2019-05-03 LAB — BASIC METABOLIC PANEL
Anion gap: 14 (ref 5–15)
BUN: 34 mg/dL — ABNORMAL HIGH (ref 8–23)
CO2: 25 mmol/L (ref 22–32)
Calcium: 8.4 mg/dL — ABNORMAL LOW (ref 8.9–10.3)
Chloride: 100 mmol/L (ref 98–111)
Creatinine, Ser: 7.09 mg/dL — ABNORMAL HIGH (ref 0.61–1.24)
GFR calc Af Amer: 8 mL/min — ABNORMAL LOW (ref >60)
GFR calc non Af Amer: 7 mL/min — ABNORMAL LOW (ref >60)
Glucose, Bld: 129 mg/dL — ABNORMAL HIGH (ref 70–99)
Potassium: 4 mmol/L (ref 3.5–5.1)
Sodium: 139 mmol/L (ref 135–145)

## 2019-05-03 LAB — CBC
HCT: 33.1 % — ABNORMAL LOW (ref 39.0–52.0)
Hemoglobin: 10.3 g/dL — ABNORMAL LOW (ref 13.0–17.0)
MCH: 28.2 pg (ref 26.0–34.0)
MCHC: 31.1 g/dL (ref 30.0–36.0)
MCV: 90.7 fL (ref 80.0–100.0)
Platelets: 148 10*3/uL — ABNORMAL LOW (ref 150–400)
RBC: 3.65 MIL/uL — ABNORMAL LOW (ref 4.22–5.81)
RDW: 14.6 % (ref 11.5–15.5)
WBC: 7.3 10*3/uL (ref 4.0–10.5)
nRBC: 0.3 % — ABNORMAL HIGH (ref 0.0–0.2)

## 2019-05-03 LAB — GLUCOSE, CAPILLARY
Glucose-Capillary: 141 mg/dL — ABNORMAL HIGH (ref 70–99)
Glucose-Capillary: 85 mg/dL (ref 70–99)
Glucose-Capillary: 192 mg/dL — ABNORMAL HIGH (ref 70–99)

## 2019-05-03 MED ORDER — DOXERCALCIFEROL 0.5 MCG PO CAPS
2.0000 ug | ORAL_CAPSULE | ORAL | Status: DC
  Administered 2019-05-03 – 2019-05-06 (×2): 2 ug via ORAL
  Filled 2019-05-03 (×2): qty 4

## 2019-05-03 MED ORDER — HEPARIN SODIUM (PORCINE) 1000 UNIT/ML IJ SOLN
INTRAMUSCULAR | Status: AC
  Filled 2019-05-03: qty 1

## 2019-05-03 NOTE — Progress Notes (Signed)
PROGRESS NOTE    Blake [name]  [id number] DOB: [birth year] DOA: 04/29/2019 PCP: [name], MD    Brief Narrative:  73 year old gentleman with history of ESRD on hemodialysis for last 2 years, type 2 diabetes, hypertension, hyperlipidemia, osteomyelitis of the right great toe who was admitted to the hospital with episode of syncope and shortness of breath after missing 2 prior hemodialysis session.  In the emergency room, he was found with positive troponin and abnormal EKG.  Nephrology and cardiology consulted.  Chest x-ray revealed cardiomegaly with pulmonary edema.  Patient had episode of unresponsiveness while being transferred to his room following his HD catheter placement in the operating room, got Narcan x2, blood pressure was 60s.  He was transferred to ICU where he received CRRT overnight and transferred back to the floor. Patient also underwent cardiac catheterization with stent placement on left circumflex and LAD.   Assessment & Plan:   Active Problems:   Hypertension   ESRD (end stage renal disease) ([place])   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 ([name])   Acute pulmonary edema ([name])   Fluid overload   Acute encephalopathy   NSTEMI (non-ST elevated myocardial infarction) ([place])  Syncopal episode: Related to hypotension and non-STEMI.  Improved.  Patient is yet to mobilize with therapies.  Non-STEMI: Status post cardiac cath with drug-eluting stent in left circumflex and LAD.  Dual antiplatelet therapy for 1 year.  Lipitor and metoprolol.  ESRD on hemodialysis TTS schedule: Patient received right chest permacath and had subsequent hemodialysis that he is tolerating.  Followed by nephrology.  Hyperkalemia: Improved.  Type 2 diabetes: Blood sugars stable.   DVT prophylaxis: Heparin subcu. Code Status: Full code Family Communication: None Disposition Plan: Inpatient.  Advance activities.  Work with physical therapist today.    Consultants:   Nephrology  Cardiology  Procedures:   Hemodialysis  Cardiac cath  Antimicrobials:   None   Subjective: Patient seen and examined.  Came back from dialysis.  Denies any chest pain or shortness of breath.  He is not confident on ambulating.  Patient thinks he needs some help to get out of the bed.  Objective: Vitals:   05/03/19 1030 05/03/19 1035 05/03/19 1056 05/03/19 1116  BP: (!) 101/48 (!) 111/54  (!) 112/54  Pulse: 63 60  67  Resp:  11  18  Temp:  (!) 97.5 F (36.4 C)  98.3 F (36.8 C)  TempSrc:  Oral  Oral  SpO2:  100%  100%  Weight:   83.8 kg   Height:        Intake/Output Summary (Last 24 hours) at 05/03/2019 1301 Last data filed at 05/03/2019 0600 Gross per 24 hour  Intake 840 ml  Output 2000 ml  Net -1160 ml   Filed Weights   05/03/19 0400 05/03/19 0651 05/03/19 1056  Weight: 90.3 kg 86.5 kg 83.8 kg    Examination:  General exam: Appears calm and comfortable, on room air at rest. Respiratory system: Clear to auscultation. Respiratory effort normal.  Right chest permacath present. Cardiovascular system: S1 & S2 heard, RRR. No JVD, murmurs, rubs, gallops or clicks. No pedal edema. Gastrointestinal system: Abdomen is nondistended, soft and nontender. No organomegaly or masses felt. Normal bowel sounds heard. Central nervous system: Alert and oriented. No focal neurological deficits. Extremities: Symmetric 5 x 5 power. Skin: No rashes, lesions or ulcers Psychiatry: Judgement and insight appear normal. Mood & affect appropriate.     Data Reviewed: I  have personally reviewed following labs and imaging studies  CBC: Recent Labs  Lab 04/29/19 1938  04/30/19 0233 04/30/19 1838 05/01/19 0248 05/02/19 0232 05/03/19 0355  WBC 7.7  --  8.6 6.4 8.4 6.7 7.3  NEUTROABS 5.3  --   --   --   --   --   --   HGB 11.4*   < > 10.9* 11.3* 11.4* 9.5* 10.3*  HCT 37.5*   < > 34.8* 36.3* 36.6* 30.1* 33.1*  MCV 93.8  --  91.3 91.9 91.3 92.0 90.7   PLT 180  --  180 147* 161 141* 148*   < > = values in this interval not displayed.   Basic Metabolic Panel: Recent Labs  Lab 04/29/19 1938  04/30/19 0233 04/30/19 1839 05/01/19 0248 05/01/19 1550 05/02/19 0232 05/03/19 0355  NA 143   < > 144 141 143 138 140  139 139  K 5.7*   < > 5.6* 4.8 5.0 4.8 4.5  4.4 4.0  CL 103   < > 107 106 107 104 106  105 100  CO2 15*  --  19* 16* 18* 21* 22  21* 25  GLUCOSE 211*   < > 125* 125* 107* 103* 154*  156* 129*  BUN 91*   < > 93* 92* 78* 46* 49*  49* 34*  CREATININE 15.20*   < > 15.37* 16.04* 12.93* 8.17* 8.52*  8.47* 7.09*  CALCIUM 8.5*  --  8.5* 8.2* 8.4* 8.1* 7.8*  7.8* 8.4*  MG 2.2  --  2.2  --  2.2  --  2.1  --   PHOS  --   --  8.6*  --  6.8* 4.4 4.9*  --    < > = values in this interval not displayed.   GFR: Estimated Creatinine Clearance: 10.1 mL/min (A) (by C-G formula based on SCr of 7.09 mg/dL (H)). Liver Function Tests: Recent Labs  Lab 04/29/19 1938 04/30/19 0233 05/01/19 0248 05/01/19 1550 05/02/19 0232  AST 443*  --   --   --   --   ALT 147*  --   --   --   --   ALKPHOS 168*  --   --   --   --   BILITOT 0.4  --   --   --   --   PROT 7.1  --   --   --   --   ALBUMIN 3.1* 3.0* 3.2* 3.0* 2.6*   No results for input(s): LIPASE, AMYLASE in the last 168 hours. No results for input(s): AMMONIA in the last 168 hours. Coagulation Profile: No results for input(s): INR, PROTIME in the last 168 hours. Cardiac Enzymes: No results for input(s): CKTOTAL, CKMB, CKMBINDEX, TROPONINI in the last 168 hours. BNP (last 3 results) No results for input(s): PROBNP in the last 8760 hours. HbA1C: No results for input(s): HGBA1C in the last 72 hours. CBG: Recent Labs  Lab 05/02/19 1126 05/02/19 1646 05/02/19 2105 05/03/19 0606 05/03/19 1119  GLUCAP 164* 205* 154* 141* 85   Lipid Profile: No results for input(s): CHOL, HDL, LDLCALC, TRIG, CHOLHDL, LDLDIRECT in the last 72 hours. Thyroid Function Tests: No results for  input(s): TSH, T4TOTAL, FREET4, T3FREE, THYROIDAB in the last 72 hours. Anemia Panel: No results for input(s): VITAMINB12, FOLATE, FERRITIN, TIBC, IRON, RETICCTPCT in the last 72 hours. Sepsis Labs: No results for input(s): PROCALCITON, LATICACIDVEN in the last 168 hours.  Recent Results (from the past 240 hour(s))  SARS Coronavirus  2 [facility] order, Performed in [facility] hospital lab) Nasopharyngeal Nasopharyngeal Swab     Status: None   Collection Time: 04/24/19 12:08 PM   Specimen: Nasopharyngeal Swab  Result Value Ref Range Status   SARS Coronavirus 2 NEGATIVE NEGATIVE Final    Comment: (NOTE) If result is NEGATIVE SARS-CoV-2 target nucleic acids are NOT DETECTED. The SARS-CoV-2 RNA is generally detectable in upper and lower  respiratory specimens during the acute phase of infection. The lowest  concentration of SARS-CoV-2 viral copies this assay can detect is 250  copies / mL. A negative result does not preclude SARS-CoV-2 infection  and should not be used as the sole basis for treatment or other  patient management decisions.  A negative result may occur with  improper specimen collection / handling, submission of specimen other  than nasopharyngeal swab, presence of viral mutation(s) within the  areas targeted by this assay, and inadequate number of viral copies  (<250 copies / mL). A negative result must be combined with clinical  observations, patient history, and epidemiological information. If result is POSITIVE SARS-CoV-2 target nucleic acids are DETECTED. The SARS-CoV-2 RNA is generally detectable in upper and lower  respiratory specimens dur ing the acute phase of infection.  Positive  results are indicative of active infection with SARS-CoV-2.  Clinical  correlation with patient history and other diagnostic information is  necessary to determine patient infection status.  Positive results do  not rule out bacterial infection or co-infection with other viruses. If  result is PRESUMPTIVE POSTIVE SARS-CoV-2 nucleic acids MAY BE PRESENT.   A presumptive positive result was obtained on the submitted specimen  and confirmed on repeat testing.  While 2019 novel coronavirus  (SARS-CoV-2) nucleic acids may be present in the submitted sample  additional confirmatory testing may be necessary for epidemiological  and / or clinical management purposes  to differentiate between  SARS-CoV-2 and other Sarbecovirus currently known to infect humans.  If clinically indicated additional testing with an alternate test  methodology [phone]) is advised. The SARS-CoV-2 RNA is generally  detectable in upper and lower respiratory sp ecimens during the acute  phase of infection. The expected result is Negative. Fact Sheet for Patients:  StrictlyIdeas.no Fact Sheet for Healthcare Providers: BankingDealers.co.za This test is not yet approved or cleared by the [place] FDA and has been authorized for detection and/or diagnosis of SARS-CoV-2 by FDA under an Emergency Use Authorization (EUA).  This EUA will remain in effect (meaning this test can be used) for the duration of the COVID-19 declaration under Section 564(b)(1) of the Act, 21 U.S.C. section 360bbb-3(b)(1), unless the authorization is terminated or revoked sooner. Performed at [facility], [place] [address], [place], [place] [address]   SARS Coronavirus 2 [facility] order, Performed in [facility] hospital lab) Nasopharyngeal Nasopharyngeal Swab     Status: None   Collection Time: 04/29/19  7:22 PM   Specimen: Nasopharyngeal Swab  Result Value Ref Range Status   SARS Coronavirus 2 NEGATIVE NEGATIVE Final    Comment: (NOTE) If result is NEGATIVE SARS-CoV-2 target nucleic acids are NOT DETECTED. The SARS-CoV-2 RNA is generally detectable in upper and lower  respiratory specimens during the acute phase of infection. The lowest  concentration of SARS-CoV-2  viral copies this assay can detect is 250  copies / mL. A negative result does not preclude SARS-CoV-2 infection  and should not be used as the sole basis for treatment or other  patient management decisions.  A negative  result may occur with  improper specimen collection / handling, submission of specimen other  than nasopharyngeal swab, presence of viral mutation(s) within the  areas targeted by this assay, and inadequate number of viral copies  (<250 copies / mL). A negative result must be combined with clinical  observations, patient history, and epidemiological information. If result is POSITIVE SARS-CoV-2 target nucleic acids are DETECTED. The SARS-CoV-2 RNA is generally detectable in upper and lower  respiratory specimens dur ing the acute phase of infection.  Positive  results are indicative of active infection with SARS-CoV-2.  Clinical  correlation with patient history and other diagnostic information is  necessary to determine patient infection status.  Positive results do  not rule out bacterial infection or co-infection with other viruses. If result is PRESUMPTIVE POSTIVE SARS-CoV-2 nucleic acids MAY BE PRESENT.   A presumptive positive result was obtained on the submitted specimen  and confirmed on repeat testing.  While 2019 novel coronavirus  (SARS-CoV-2) nucleic acids may be present in the submitted sample  additional confirmatory testing may be necessary for epidemiological  and / or clinical management purposes  to differentiate between  SARS-CoV-2 and other Sarbecovirus currently known to infect humans.  If clinically indicated additional testing with an alternate test  methodology [phone]) is advised. The SARS-CoV-2 RNA is generally  detectable in upper and lower respiratory sp ecimens during the acute  phase of infection. The expected result is Negative. Fact Sheet for Patients:  StrictlyIdeas.no Fact Sheet for Healthcare Providers:  BankingDealers.co.za This test is not yet approved or cleared by the [place] FDA and has been authorized for detection and/or diagnosis of SARS-CoV-2 by FDA under an Emergency Use Authorization (EUA).  This EUA will remain in effect (meaning this test can be used) for the duration of the COVID-19 declaration under Section 564(b)(1) of the Act, 21 U.S.C. section 360bbb-3(b)(1), unless the authorization is terminated or revoked sooner. Performed at [facility], [place] [address], [place], [place] [address]   MRSA PCR Screening     Status: None   Collection Time: 04/30/19  8:30 PM   Specimen: Nasal Mucosa; Nasopharyngeal  Result Value Ref Range Status   MRSA by PCR NEGATIVE NEGATIVE Final    Comment:        The GeneXpert MRSA Assay (FDA approved for NASAL specimens only), is one component of a comprehensive MRSA colonization surveillance program. It is not intended to diagnose MRSA infection nor to guide or monitor treatment for MRSA infections. Performed at [facility], [place] [address], [place], [place] [address]   Culture, blood (routine x 2)     Status: None (Preliminary result)   Collection Time: 04/30/19  8:35 PM   Specimen: BLOOD  Result Value Ref Range Status   Specimen Description BLOOD LEFT HAND  Final   Special Requests   Final    BOTTLES DRAWN AEROBIC ONLY Blood Culture results may not be optimal due to an inadequate volume of blood received in culture bottles   Culture   Final    NO GROWTH 3 DAYS Performed at [facility], [place] [address], [place], [place] [address]    Report Status PENDING  Incomplete  Culture, blood (routine x 2)     Status: None (Preliminary result)   Collection Time: 04/30/19  8:58 PM   Specimen: BLOOD  Result Value Ref Range Status   Specimen Description BLOOD LEFT HAND  Final   Special Requests   Final  BOTTLES DRAWN AEROBIC ONLY Blood Culture results may not be optimal due to an inadequate  volume of blood received in culture bottles   Culture   Final    NO GROWTH 3 DAYS Performed at [facility], [place] [address], [place], [place] [address]    Report Status PENDING  Incomplete         Radiology Studies: No results found.      Scheduled Meds: . aspirin EC  81 mg Oral Daily  . atorvastatin  40 mg Oral QHS  . calcium acetate  1,334 mg Oral TID WC  . Chlorhexidine Gluconate Cloth  6 each Topical Daily  . doxercalciferol  2 mcg Oral Q T,Th,Sa-HD  . heparin      . heparin injection (subcutaneous)  5,000 Units Subcutaneous Q8H  . insulin aspart  0-5 Units Subcutaneous QHS  . insulin aspart  0-9 Units Subcutaneous TID WC  . mouth rinse  15 mL Mouth Rinse BID  . metoprolol tartrate  12.5 mg Oral BID  . multivitamin  1 tablet Oral QHS  . pantoprazole  40 mg Oral BID  . sodium chloride flush  3 mL Intravenous Q12H  . sodium chloride flush  3 mL Intravenous Q12H  . ticagrelor  90 mg Oral BID   Continuous Infusions: . sodium chloride Stopped (05/02/19 1500)  . sodium chloride       LOS: 4 days    Time spent: 25 minutes.    Barb Merino, MD [facility] Pager [phone]  If 7PM-7AM, please contact night-coverage [email or web address] Password [email or web address] 05/03/2019, 1:01 PM

## 2019-05-03 NOTE — Care Management Important Message (Signed)
Important Message  Patient Details  Name: Blake [name] MRN: [id number] Date of Birth: April 30, 1946   Medicare Important Message Given:  Yes     Orbie Pyo 05/03/2019, 2:45 PM

## 2019-05-03 NOTE — Consult Note (Signed)
   [facility] Inpatient Consult   05/03/2019  HARIM SAUCER Jun 30, 1946 [id number]   Patient screened for high risk score for unplanned readmission score  and/or for hospitalizations to check if potential [place] [facility] services are needed.  Review of patient's medical record recently transferred out of [place]. Patient is for HD, will continue to follow for disposition and needs.  Primary Care Provider is [name], MD this office is listed to provide the transition of care.  Plan:  Follow for progress and disposition of needs.     Please place a [facility] Care Management consult as appropriate and for questions contact:   Natividad Brood, RN BSN [facility] Liaison  [phone] business mobile phone Toll free office [phone]  Fax number: [phone] [email or web address]

## 2019-05-03 NOTE — Progress Notes (Signed)
Subjective:  Seen in HD, no new c/o's.   Objective Vital signs in last 24 hours: Vitals:   05/03/19 0700 05/03/19 0800 05/03/19 0830 05/03/19 0900  BP: (!) 140/59 (!) 108/49 (!) 108/56 (!) 93/49  Pulse: 60 63 63 64  Resp:      Temp:      TempSrc:      SpO2:      Weight:      Height:       Weight change: 1.3 kg  Physical Exam: General: alert calm NAD chronically ill male Heart: RRR, no m,r,g Lungs: CTA ,Non labored breathing  Abdomen:obese, soft , NT, ND Extremities: no pedal edema , R grt toe amp site dry /healed Dialysis Access:  R IJ Perm cath patent on CRRT currently , R UA AVF pos bruit/thrilll  /stable surgical site at recent plication of avf   [facility]  4h  83kg kg (last 2 txs below edw )  2K/3.5CA  Hep 6000 RFA AVF     Hec 2 mcg IV/HD Venofer 50mg  q wkly   No ESA    Assessment/Plan 1. Pulmonary  Edema/ vol overload due to missed HD +  NSTEMI - improved. F/u CXR for today after HD. 2. NSTEMI / CAD- w/ ^^trop's, sp LHC on 8/18 w/ stent x 2 to LCX and prox LAD 3. ESRD - on HD TTS. SP brief CRRT, now back on iHD. HD today.  4. Malfunctioning R AVF - Sp plication [id number], unable to access AVF due to limited space for needles. VVS placed R IJ Perm cath 8/17. Has seen and noted (" Will arrange follow-up in 4 weeks eval avf" 5. Hypertension/volume  - vol better/ ok today, no op BP meds  6. Anemia  - hgb 11.4  No OP esa , weekly Fe on HD  7. Metabolic bone disease -  Hectorol  On hd and binders 8. DM Type 2 per admit  Kelly Splinter, MD 05/03/2019, 9:59 AM        Labs: Basic Metabolic Panel: Recent Labs  Lab 05/01/19 0248 05/01/19 1550 05/02/19 0232 05/03/19 0355  NA 143 138 140  139 139  K 5.0 4.8 4.5  4.4 4.0  CL 107 104 106  105 100  CO2 18* 21* 22  21* 25  GLUCOSE 107* 103* 154*  156* 129*  BUN 78* 46* 49*  49* 34*  CREATININE 12.93* 8.17* 8.52*  8.47* 7.09*  CALCIUM 8.4* 8.1* 7.8*  7.8* 8.4*  PHOS 6.8* 4.4 4.9*  --    Liver Function  Tests: Recent Labs  Lab 04/29/19 1938  05/01/19 0248 05/01/19 1550 05/02/19 0232  AST 443*  --   --   --   --   ALT 147*  --   --   --   --   ALKPHOS 168*  --   --   --   --   BILITOT 0.4  --   --   --   --   PROT 7.1  --   --   --   --   ALBUMIN 3.1*   < > 3.2* 3.0* 2.6*   < > = values in this interval not displayed.   No results for input(s): LIPASE, AMYLASE in the last 168 hours. No results for input(s): AMMONIA in the last 168 hours. CBC: Recent Labs  Lab 04/29/19 1938  04/30/19 0233 04/30/19 1838 05/01/19 0248 05/02/19 0232 05/03/19 0355  WBC 7.7  --  8.6  6.4 8.4 6.7 7.3  NEUTROABS 5.3  --   --   --   --   --   --   HGB 11.4*   < > 10.9* 11.3* 11.4* 9.5* 10.3*  HCT 37.5*   < > 34.8* 36.3* 36.6* 30.1* 33.1*  MCV 93.8  --  91.3 91.9 91.3 92.0 90.7  PLT 180  --  180 147* 161 141* 148*   < > = values in this interval not displayed.   Cardiac Enzymes: No results for input(s): CKTOTAL, CKMB, CKMBINDEX, TROPONINI in the last 168 hours. CBG: Recent Labs  Lab 05/02/19 1020 05/02/19 1126 05/02/19 1646 05/02/19 2105 05/03/19 0606  GLUCAP 213* 164* 205* 154* 141*    Medications: . sodium chloride Stopped (05/02/19 1500)  . sodium chloride     . aspirin EC  81 mg Oral Daily  . atorvastatin  40 mg Oral QHS  . calcium acetate  1,334 mg Oral TID WC  . Chlorhexidine Gluconate Cloth  6 each Topical Daily  . doxercalciferol  2 mcg Oral Q T,Th,Sa-HD  . heparin      . heparin injection (subcutaneous)  5,000 Units Subcutaneous Q8H  . insulin aspart  0-5 Units Subcutaneous QHS  . insulin aspart  0-9 Units Subcutaneous TID WC  . mouth rinse  15 mL Mouth Rinse BID  . metoprolol tartrate  12.5 mg Oral BID  . multivitamin  1 tablet Oral QHS  . pantoprazole  40 mg Oral BID  . sodium chloride flush  3 mL Intravenous Q12H  . sodium chloride flush  3 mL Intravenous Q12H  . ticagrelor  90 mg Oral BID

## 2019-05-03 NOTE — Progress Notes (Signed)
PT Cancellation Note  Patient Details Name: Blake [name] MRN: [id number] DOB: 1946-01-14   Cancelled Treatment:    Reason Eval/Treat Not Completed: Patient at procedure or test/unavailable (HD). PT will continue to f/u with pt acutely as available.    [place] 05/03/2019, 9:23 AM

## 2019-05-04 ENCOUNTER — Telehealth: Payer: Self-pay | Admitting: Cardiology

## 2019-05-04 ENCOUNTER — Telehealth: Payer: Self-pay

## 2019-05-04 DIAGNOSIS — L8990 Pressure ulcer of unspecified site, unspecified stage: Secondary | ICD-10-CM | POA: Insufficient documentation

## 2019-05-04 DIAGNOSIS — E8779 Other fluid overload: Secondary | ICD-10-CM

## 2019-05-04 LAB — BASIC METABOLIC PANEL
Anion gap: 14 (ref 5–15)
BUN: 29 mg/dL — ABNORMAL HIGH (ref 8–23)
CO2: 24 mmol/L (ref 22–32)
Calcium: 8.6 mg/dL — ABNORMAL LOW (ref 8.9–10.3)
Chloride: 98 mmol/L (ref 98–111)
Creatinine, Ser: 6.03 mg/dL — ABNORMAL HIGH (ref 0.61–1.24)
GFR calc Af Amer: 10 mL/min — ABNORMAL LOW (ref >60)
GFR calc non Af Amer: 9 mL/min — ABNORMAL LOW (ref >60)
Glucose, Bld: 152 mg/dL — ABNORMAL HIGH (ref 70–99)
Potassium: 4.1 mmol/L (ref 3.5–5.1)
Sodium: 136 mmol/L (ref 135–145)

## 2019-05-04 LAB — CBC
HCT: 35 % — ABNORMAL LOW (ref 39.0–52.0)
Hemoglobin: 11 g/dL — ABNORMAL LOW (ref 13.0–17.0)
MCH: 28.4 pg (ref 26.0–34.0)
MCHC: 31.4 g/dL (ref 30.0–36.0)
MCV: 90.2 fL (ref 80.0–100.0)
Platelets: 157 10*3/uL (ref 150–400)
RBC: 3.88 MIL/uL — ABNORMAL LOW (ref 4.22–5.81)
RDW: 14.3 % (ref 11.5–15.5)
WBC: 8.5 10*3/uL (ref 4.0–10.5)
nRBC: 0 % (ref 0.0–0.2)

## 2019-05-04 LAB — GLUCOSE, CAPILLARY
Glucose-Capillary: 219 mg/dL — ABNORMAL HIGH (ref 70–99)
Glucose-Capillary: 192 mg/dL — ABNORMAL HIGH (ref 70–99)
Glucose-Capillary: 104 mg/dL — ABNORMAL HIGH (ref 70–99)
Glucose-Capillary: 221 mg/dL — ABNORMAL HIGH (ref 70–99)

## 2019-05-04 MED ORDER — CHLORHEXIDINE GLUCONATE CLOTH 2 % EX PADS
6.0000 | MEDICATED_PAD | Freq: Every day | CUTANEOUS | Status: DC
  Administered 2019-05-05 – 2019-05-06 (×2): 6 via TOPICAL

## 2019-05-04 MED ORDER — TICAGRELOR 90 MG PO TABS: 90.0000 mg | ORAL_TABLET | Freq: Two times a day (BID) | ORAL | 2 refills | Status: DC

## 2019-05-04 MED ORDER — ATORVASTATIN CALCIUM 40 MG PO TABS: 40.0000 mg | ORAL_TABLET | Freq: Every day | ORAL | 2 refills | Status: AC

## 2019-05-04 MED ORDER — METOPROLOL TARTRATE 25 MG PO TABS: 12.5000 mg | ORAL_TABLET | Freq: Two times a day (BID) | ORAL | 2 refills | Status: AC

## 2019-05-04 MED ORDER — CLOPIDOGREL BISULFATE 75 MG PO TABS: 75.0000 mg | ORAL_TABLET | Freq: Every day | ORAL | 11 refills | Status: DC

## 2019-05-04 NOTE — Evaluation (Signed)
Physical Therapy Evaluation Patient Details Name: Blake [name] MRN: [id number] DOB: [birth year] Today's Date: 05/04/2019   History of Present Illness  73 year old gentleman with history of ESRD on hemodialysis for last 2 years, type 2 diabetes, hypertension, hyperlipidemia, osteomyelitis of the right great toe who was admitted to the hospital with episode of syncope and shortness of breath after missing 2 prior hemodialysis session.  In the emergency room, he was found with positive troponin and abnormal EKG. Cardiology consulted. Pt found to have NSTEMI. He underwent cardiac catheterization with stent placement on left circumflex and LAD 05/01/19.    Clinical Impression  Pt admitted with above diagnosis. On eval, pt required min guard assist bed mobility, transfers and gait with RW 15 feet. Gait limited by dizziness with stance. BP 129/56 upon sitting in recliner after in room ambulation. Pt encouraged to sit up during the day to help with dizziness. Pt currently with functional limitations due to the deficits listed below (see PT Problem List). Pt will benefit from skilled PT to increase their independence and safety with mobility to allow discharge to the venue listed below.  Pt reports he has the option for bus transportation to HD upon d/c home.     Follow Up Recommendations Home health PT;Supervision for mobility/OOB    Equipment Recommendations  Rolling walker with 5" wheels;3in1 (PT)    Recommendations for Other Services       Precautions / Restrictions Precautions Precautions: Fall Restrictions Weight Bearing Restrictions: No      Mobility  Bed Mobility Overal bed mobility: Needs Assistance Bed Mobility: Supine to Sit     Supine to sit: HOB elevated;Min guard     General bed mobility comments: +rail, increased time  Transfers Overall transfer level: Needs assistance Equipment used: Rolling walker (2 wheeled) Transfers: Sit to/from Stand Sit to Stand: Min  guard         General transfer comment: cues for hand placement  Ambulation/Gait Ambulation/Gait assistance: Min guard Gait Distance (Feet): 15 Feet Assistive device: Rolling walker (2 wheeled) Gait Pattern/deviations: Step-through pattern;Decreased stride length Gait velocity: decreased   General Gait Details: Pt with c/o dizziness upon standing. Ambulation, therefore, limited to in room. Pt assisted to recliner. BP taken, 129/56.  Stairs            Wheelchair Mobility    Modified Rankin (Stroke Patients Only)       Balance Overall balance assessment: Needs assistance Sitting-balance support: No upper extremity supported;Feet supported Sitting balance-Leahy Scale: Good     Standing balance support: Bilateral upper extremity supported;During functional activity Standing balance-Leahy Scale: Poor Standing balance comment: reliant on RW                             Pertinent Vitals/Pain Pain Assessment: No/denies pain    Home Living Family/patient expects to be discharged to:: Private residence Living Arrangements: Other relatives(niece) Available Help at Discharge: Family;Friend(s);Available PRN/intermittently Type of Home: House Home Access: Stairs to enter Entrance Stairs-Rails: Psychiatric nurse of Steps: 3 Home Layout: One level Home Equipment: Cane - single point;Shower seat      Prior Function Level of Independence: Independent with assistive device(s)         Comments: ambulates with cane household distances. Drives himself to HD and grocery store. Uses wheelchair once inside HD clinic and motorized scooter in grocery store.     Hand Dominance   Dominant Hand: Right    Extremity/Trunk Assessment  Upper Extremity Assessment Upper Extremity Assessment: Overall WFL for tasks assessed    Lower Extremity Assessment Lower Extremity Assessment: Overall WFL for tasks assessed    Cervical / Trunk  Assessment Cervical / Trunk Assessment: Kyphotic  Communication   Communication: No difficulties  Cognition Arousal/Alertness: Awake/alert Behavior During Therapy: WFL for tasks assessed/performed;Flat affect Overall Cognitive Status: Within Functional Limits for tasks assessed                                 General Comments: decreased safety awareness      General Comments General comments (skin integrity, edema, etc.): Pt on RA.    Exercises     Assessment/Plan    PT Assessment Patient needs continued PT services  PT Problem List Decreased mobility;Decreased safety awareness;Decreased knowledge of precautions;Decreased activity tolerance;Decreased balance;Decreased knowledge of use of DME       PT Treatment Interventions DME instruction;Therapeutic activities;Gait training;Therapeutic exercise;Patient/family education;Stair training;Balance training;Functional mobility training    PT Goals (Current goals can be found in the Care Plan section)  Acute Rehab PT Goals Patient Stated Goal: home PT Goal Formulation: With patient Time For Goal Achievement: 05/18/19 Potential to Achieve Goals: Good    Frequency Min 3X/week   Barriers to discharge        Co-evaluation               AM-PAC PT "6 Clicks" Mobility  Outcome Measure Help needed turning from your back to your side while in a flat bed without using bedrails?: None Help needed moving from lying on your back to sitting on the side of a flat bed without using bedrails?: None Help needed moving to and from a bed to a chair (including a wheelchair)?: A Little Help needed standing up from a chair using your arms (e.g., wheelchair or bedside chair)?: A Little Help needed to walk in hospital room?: A Little Help needed climbing 3-5 steps with a railing? : A Lot 6 Click Score: 19    End of Session Equipment Utilized During Treatment: Gait belt Activity Tolerance: Treatment limited secondary to  medical complications (Comment)(dizziness) Patient left: in chair;with call bell/phone within reach;with chair alarm set Nurse Communication: Mobility status PT Visit Diagnosis: Unsteadiness on feet (R26.81);Dizziness and giddiness (R42)    Time: [id number] PT Time Calculation (min) (ACUTE ONLY): 16 min   Charges:   PT Evaluation $PT Eval Moderate Complexity: 1 Mod          Lorrin Goodell, PT  Office # [phone] Pager [phone]   Lorriane Shire 05/04/2019, 11:39 AM

## 2019-05-04 NOTE — Progress Notes (Signed)
Pt is unable to walk 20 feet without almost falling. Pt c/o increase dizziness.

## 2019-05-04 NOTE — Telephone Encounter (Signed)
-----   Message from Ledora Bottcher, [place] sent at 05/04/2019 11:51 AM EDT ----- Please arrange TOC appt with an APP in 1-2 weeks with Dr. Jacalyn Lefevre team. Will also need a 3 month follow up with Dr. Stanford Breed.  Thanks Angie

## 2019-05-04 NOTE — TOC Benefit Eligibility Note (Signed)
Transition of Care [facility]) Benefit Eligibility Note    Patient Details  Name: NYAN WHAN MRN: [id number] Date of Birth: 1946-03-22   Medication/Dose: Brilinta 90 mg bid- 30 day supply  Covered?: Yes  Tier: 3 Drug     Spoke with Person/Company/Phone Number:: [employer] at  [phone]  Co-Pay: $100  Prior Approval: No  Deductible: Unmet       Tommy Medal Phone Number: 05/04/2019, 10:01 AM

## 2019-05-04 NOTE — Progress Notes (Addendum)
Subjective:  Seen in HD, no new c/o's.   Objective Vital signs in last 24 hours: Vitals:   05/03/19 2324 05/04/19 0403 05/04/19 0822 05/04/19 1134  BP: (!) 112/48 (!) 128/54 135/64 (!) 107/48  Pulse: 67 66 60 62  Resp: 16 17  15   Temp: 98 F (36.7 C) 97.9 F (36.6 C) 98.3 F (36.8 C) 98.9 F (37.2 C)  TempSrc: Oral Oral Oral Oral  SpO2: 94% 95% 97% 98%  Weight:  86.6 kg    Height:       Weight change: -2.7 kg  Physical Exam: General: alert calm NAD chronically ill male Heart: RRR, no m,r,g Lungs: CTA ,Non labored breathing  Abdomen:obese, soft , NT, ND Extremities: no pedal edema , R grt toe amp site dry /healed Dialysis Access:  R IJ Perm cath patent on CRRT currently , R UA AVF pos bruit/thrilll  /stable surgical site at recent plication of avf   OP HD [facility]: [facility]  4h  83kg kg (last 2 txs below edw )  2K/3.5CA  Hep 6000 RFA AVF     Hec 2 mcg IV/HD Venofer 50mg  q wkly   No ESA     CXR 8/17 > diffuse IS edema  CXR 8/20 > - resolved IS edema    Assessment/Plan 1. Pulmonary  Edema/ vol overload due to missed HD +  NSTEMI - improved. CXR confirms resolved IS edema.  2. NSTEMI / CAD- sp LHC 8/18 w/ stent x 2 to LCX and prox LAD 3. ESRD - on HD TTS. SP brief CRRT, now back on iHD. HD Sat.  4. Malfunctioning R AVF - Sp plication [id number], unable to access AVF due to limited space for needles. VVS placed R IJ Perm cath 8/17. Has seen and noted (" Will arrange follow-up in 4 weeks eval avf" 5. Hypertension/volume  - 3kg up by wts, stable exam, no op BP meds  6. Anemia  - hgb 11.4  No OP esa , weekly Fe on HD  7. Metabolic bone disease -  Hectorol  On hd and binders 8. DM Type 2 per admit  Kelly Splinter, MD 05/04/2019, 12:24 PM        Labs: Basic Metabolic Panel: Recent Labs  Lab 05/01/19 0248 05/01/19 1550 05/02/19 0232 05/03/19 0355 05/04/19 0154  NA 143 138 140  139 139 136  K 5.0 4.8 4.5  4.4 4.0 4.1  CL 107 104 106  105 100 98  CO2 18* 21* 22  21*  25 24  GLUCOSE 107* 103* 154*  156* 129* 152*  BUN 78* 46* 49*  49* 34* 29*  CREATININE 12.93* 8.17* 8.52*  8.47* 7.09* 6.03*  CALCIUM 8.4* 8.1* 7.8*  7.8* 8.4* 8.6*  PHOS 6.8* 4.4 4.9*  --   --    Liver Function Tests: Recent Labs  Lab 04/29/19 1938  05/01/19 0248 05/01/19 1550 05/02/19 0232  AST 443*  --   --   --   --   ALT 147*  --   --   --   --   ALKPHOS 168*  --   --   --   --   BILITOT 0.4  --   --   --   --   PROT 7.1  --   --   --   --   ALBUMIN 3.1*   < > 3.2* 3.0* 2.6*   < > = values in this interval not displayed.   No results for input(s):  LIPASE, AMYLASE in the last 168 hours. No results for input(s): AMMONIA in the last 168 hours. CBC: Recent Labs  Lab 04/29/19 1938  04/30/19 1838 05/01/19 0248 05/02/19 0232 05/03/19 0355 05/04/19 0154  WBC 7.7   < > 6.4 8.4 6.7 7.3 8.5  NEUTROABS 5.3  --   --   --   --   --   --   HGB 11.4*   < > 11.3* 11.4* 9.5* 10.3* 11.0*  HCT 37.5*   < > 36.3* 36.6* 30.1* 33.1* 35.0*  MCV 93.8   < > 91.9 91.3 92.0 90.7 90.2  PLT 180   < > 147* 161 141* 148* 157   < > = values in this interval not displayed.   Cardiac Enzymes: No results for input(s): CKTOTAL, CKMB, CKMBINDEX, TROPONINI in the last 168 hours. CBG: Recent Labs  Lab 05/03/19 1119 05/03/19 1629 05/03/19 2110 05/04/19 0614 05/04/19 1129  GLUCAP 85 192* 219* 192* 104*    Medications: . sodium chloride Stopped (05/02/19 1500)  . sodium chloride     . aspirin EC  81 mg Oral Daily  . atorvastatin  40 mg Oral QHS  . calcium acetate  1,334 mg Oral TID WC  . Chlorhexidine Gluconate Cloth  6 each Topical Daily  . doxercalciferol  2 mcg Oral Q T,Th,Sa-HD  . heparin injection (subcutaneous)  5,000 Units Subcutaneous Q8H  . insulin aspart  0-5 Units Subcutaneous QHS  . insulin aspart  0-9 Units Subcutaneous TID WC  . mouth rinse  15 mL Mouth Rinse BID  . metoprolol tartrate  12.5 mg Oral BID  . multivitamin  1 tablet Oral QHS  . pantoprazole  40 mg Oral  BID  . sodium chloride flush  3 mL Intravenous Q12H  . sodium chloride flush  3 mL Intravenous Q12H  . ticagrelor  90 mg Oral BID

## 2019-05-04 NOTE — Progress Notes (Signed)
Patient could not afford Brillinta co-pay. Changed to plavix and educated the patient.

## 2019-05-04 NOTE — Telephone Encounter (Signed)
Still admitted-04/29/2019 - present (5 days)  [place]

## 2019-05-04 NOTE — Consult Note (Signed)
   [facility] Inpatient Consult   05/04/2019  Blake [name] 01-20-1946 [id number]   Follow up:  Call attempt to the patient regarding potential [facility] Care Management needs. Reviewed inpatient [facility] RNCM notes and MD notes briefly for potential needs.  No answer to patient's phone and unable to leave a voice mail message currently. Primary Care Provider does the transition of care follow up. Patient has dialysis on TTS schedule at [facility] HD center per notes.  For questions or referral please contact:  Natividad Brood, RN BSN [facility] Liaison  [phone] business mobile phone Toll free office [phone]  Fax number: [phone] [email or web address]

## 2019-05-04 NOTE — Telephone Encounter (Signed)
LVM for patient to call and schedule 2 week followup with PA and also 3 month followup with Dr. Stanford Breed.

## 2019-05-04 NOTE — Discharge Summary (Signed)
Physician Discharge Summary  Blake [name] [id number] DOB: [birth year] DOA: 04/29/2019  PCP: [name], MD  Admit date: 04/29/2019 Discharge date: 05/04/2019  Admitted From: home  Disposition:  Home   Recommendations for Outpatient Follow-up:  1. Follow up with PCP in 1-2 weeks 2. Go to dialysis center tomorrow as scheduled.  Home Health: Home health PT Equipment/Devices: None  Discharge Condition: Stable CODE STATUS: Full code Diet recommendation: Low-salt low carbohydrate diet.  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  In the emergency room he was found with positive troponins and abnormal EKG.  Underwent cardiac cath and stent placement on left circumflex and LAD.  Patient also went for permacath, upon return to the room, he was found to be hypotensive and unresponsive, responded to Narcan was admitted to ICU overnight underwent CRRT and stabilized.   Discharge Diagnoses:  Active Problems:   Hypertension   ESRD (end stage renal disease) ([place])   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 ([name])   Acute pulmonary edema ([name])   Fluid overload   Acute encephalopathy   NSTEMI (non-ST elevated myocardial infarction) ([name])   Pressure injury of skin  Non-ST relation MI: Status post cardiac cath with drug-eluting stent in the left circumflex and LAD.  Dual antiplatelet therapy for 1 year.  Increase dose of Lipitor from 20 mg to 40 mg.  Metoprolol added.  Will have outpatient follow-up with cardiology.  Cardiac rehab.  ESRD on hemodialysis: Received right-sided IJ  permacath and subsequent dialysis.  Pulmonary edema improved and currently on room air.  Going home and he  will keep up with his outpatient dialysis schedule tomorrow.  His blood pressures are stable.  Blood sugars are stable.  He just on sliding scale at home.  He has ambulated in the hallway with therapy, has some ambulatory dysfunction and he will benefit with physical therapy at home.  Will prescribe home care with home PT.  Discharge Instructions  Discharge Instructions    Amb Referral to Cardiac Rehabilitation   Complete by: As directed    Diagnosis:  Coronary Stents NSTEMI     After initial evaluation and assessments completed: Virtual Based Care may be provided alone or in conjunction with Phase 2 Cardiac Rehab based on patient barriers.: Yes   Diet - low sodium heart healthy   Complete by: As directed    Discharge instructions   Complete by: As directed    Go to your dialysis session tomorrow as scheduled.   Increase activity slowly   Complete by: As directed      Allergies as of 05/04/2019   No Known Allergies     Medication List    STOP taking these medications   Lokelma 10 g Pack packet Generic drug: sodium zirconium cyclosilicate     TAKE these medications   aspirin EC 81 MG tablet Take 81 mg by mouth daily.   atorvastatin 40 MG tablet Commonly known as: LIPITOR Take 1 tablet (40 mg total) by mouth at bedtime. What changed:   medication strength  how much to take   Calcium Acetate 667 MG Tabs Take 667 mg by mouth 3 (three) times daily with meals.   HYDROcodone-acetaminophen 5-325 MG tablet Commonly known as: Norco Take 1 tablet  by mouth every 6 (six) hours as needed for moderate pain.   insulin regular 100 units/mL injection Commonly known as: NovoLIN R ReliOn Inject 0-0.09 mLs (0-9 Units total) into the skin 3 (three) times daily with meals. CBG < 70: implement hypoglycemia protocol CBG 70 - 120: 0 units CBG 121 - 150: 1 unit CBG 151 - 200: 2 units CBG 201 - 250: 3 units CBG 251 - 300: 5 units CBG 301 - 350: 7 units CBG 351 - 400: 9 units CBG >  400: call MD. What changed:   how much to take  additional instructions   metoprolol tartrate 25 MG tablet Commonly known as: LOPRESSOR Take 0.5 tablets (12.5 mg total) by mouth 2 (two) times daily.   Nephro Vitamins 0.8 MG Tabs Take 0.8 mg by mouth daily.   ondansetron 4 MG tablet Commonly known as: ZOFRAN Take 1 tablet (4 mg total) by mouth every 6 (six) hours. What changed:   when to take this  reasons to take this   pantoprazole 40 MG tablet Commonly known as: PROTONIX Take 40 mg by mouth 2 (two) times daily.   PARoxetine 10 MG tablet Commonly known as: PAXIL Take 10 mg by mouth daily.   ticagrelor 90 MG Tabs tablet Commonly known as: BRILINTA Take 1 tablet (90 mg total) by mouth 2 (two) times daily.       No Known Allergies  Consultations:  Nephrology  Cardiology    Procedures/Studies: Dg Chest 1 View  Result Date: 04/30/2019 CLINICAL DATA:  Shortness of breath EXAM: CHEST  1 VIEW COMPARISON:  04/30/2019 FINDINGS: Bilateral interstitial and patchy alveolar airspace opacities. No pleural effusion or pneumothorax. Stable cardiomegaly. No acute osseous abnormality. Right-sided central venous catheter with the tip projecting over the cavoatrial junction. IMPRESSION: Findings concerning for mild pulmonary edema. Electronically Signed   By: Kathreen Devoid   On: 04/30/2019 19:55   Dg Chest 2 View  Result Date: 05/03/2019 CLINICAL DATA:  73 year old male with history of diabetes and hypertension presenting with pulmonary edema. EXAM: CHEST - 2 VIEW COMPARISON:  Chest radiograph dated 04/30/2019 FINDINGS: Right-sided dialysis catheter in similar position. Stable borderline cardiomegaly. There is diffuse chronic interstitial coarsening. There is mild vascular congestion, similar or slightly improved since the prior radiograph. A trace right pleural effusion may be present. No lobar consolidation or pneumothorax. No acute osseous pathology. IMPRESSION: 1. Mild vascular  congestion, similar or slightly improved since the prior radiograph. 2. Possible trace right pleural effusion. Electronically Signed   By: Anner Crete M.D.   On: 05/03/2019 14:16   Ct Head Wo Contrast  Result Date: 04/29/2019 CLINICAL DATA:  Seizure, new, nontraumatic, >40 yrs EXAM: CT HEAD WITHOUT CONTRAST TECHNIQUE: Contiguous axial images were obtained from the base of the skull through the vertex without intravenous contrast. COMPARISON:  Head CT 02/08/2018 FINDINGS: Brain: No intracranial hemorrhage, mass effect, or midline shift. Generalized atrophy. Unchanged chronic small vessel ischemia, moderate for age. No hydrocephalus. The basilar cisterns are patent. No evidence of territorial infarct or acute ischemia. No extra-axial or intracranial fluid collection. Vascular: Atherosclerosis of skullbase vasculature without hyperdense vessel or abnormal calcification. Skull: No fracture or focal lesion. Sinuses/Orbits: Paranasal sinuses and mastoid air cells are clear. The visualized orbits are unremarkable. Left cataract resection. Other: None. IMPRESSION: 1. No acute intracranial abnormality. 2. Unchanged atrophy and chronic small vessel ischemia. Electronically Signed   By: Keith Rake M.D.   On: 04/29/2019 22:33   Ct Angio Chest Pe W And/or  Wo Contrast  Result Date: 04/30/2019 CLINICAL DATA:  PE suspected, high pretest prob Patient found unresponsive in bathroom, missed last 3 dialysis appointments. EXAM: CT ANGIOGRAPHY CHEST WITH CONTRAST TECHNIQUE: Multidetector CT imaging of the chest was performed using the standard protocol during bolus administration of intravenous contrast. Multiplanar CT image reconstructions and MIPs were obtained to evaluate the vascular anatomy. CONTRAST:  92mL OMNIPAQUE IOHEXOL 350 MG/ML SOLN COMPARISON:  Radiograph yesterday. No prior chest CT. Lung bases from abdominal CT 02/08/2018 FINDINGS: Cardiovascular: There are no filling defects within the pulmonary  arteries to suggest pulmonary embolus. Aortic atherosclerosis without aneurysm or dissection. Coronary artery calcifications. Mild cardiomegaly. No pericardial effusion. Mediastinum/Nodes: Enlarged lower paratracheal node at 14 mm, series 5, image 36. Enlarged right hilar node measuring 2.6 cm, image 50. Multiple additional mildly enlarged and prominent bilateral hilar and mediastinal nodes. Decompressed esophagus. Small hiatal hernia. High-density material in the distal esophagus and proximal stomach. No visualized thyroid nodule. Lungs/Pleura: Fine reticular and ground-glass opacities present in both lungs, right greater than left and more prominent at the lung bases. Similar reticular opacities seen on prior abdominal CT, however currently increased from that exam. Clustered calcified pulmonary nodules in the right lung. Mild irregular fissural thickening. No septal thickening or pleural fluid. Central bronchial thickening, trachea and mainstem bronchi are patent. Upper Abdomen: Small hiatal hernia. No acute findings. Musculoskeletal: There are no acute or suspicious osseous abnormalities. Review of the MIP images confirms the above findings. IMPRESSION: 1. No pulmonary embolus. 2. Fine reticular and ground-glass opacities in both lungs, right greater than left and more prominent at the lung bases. Similar reticular opacities seen on 2019 abdominal CT, however currently increased from that exam. Suspect pulmonary edema or acute infection superimposed on chronic interstitial lung disease. Consider nonemergent high resolution chest CT for characterization. 3. Mild mediastinal and right hilar adenopathy is likely reactive, but nonspecific. 4. Aortic Atherosclerosis (ICD10-I70.0). Coronary artery calcifications. 5. Small hiatal hernia. Electronically Signed   By: Keith Rake M.D.   On: 04/30/2019 00:25   Dg Chest Port 1 View  Result Date: 04/30/2019 CLINICAL DATA:  Status post left heart catheterization  EXAM: PORTABLE CHEST 1 VIEW COMPARISON:  04/29/2019 FINDINGS: Cardiac shadow is stable. Dialysis catheter is now seen in satisfactory position. Stable opacities are noted throughout both lungs similar to that seen on the prior exam likely related to mild pulmonary edema. No sizable effusion is seen. No pneumothorax is noted. IMPRESSION: Stable opacities bilaterally.  No new focal abnormality is seen. Electronically Signed   By: Inez Catalina M.D.   On: 04/30/2019 16:39   Dg Chest Portable 1 View  Result Date: 04/29/2019 CLINICAL DATA:  Dyspnea. Syncope. Found unresponsive in bathroom. Missed dialysis. EXAM: PORTABLE CHEST 1 VIEW COMPARISON:  Radiograph 12/10/2018 FINDINGS: Cardiomegaly is unchanged. Unchanged mediastinal contours. Fine reticular opacities throughout both lungs which are more prominent than on prior exam. Small right and possibly left pleural effusions. No confluent airspace disease. No pneumothorax. No acute osseous abnormalities. IMPRESSION: Cardiomegaly with pulmonary edema and small right and possibly left pleural effusions. Electronically Signed   By: Keith Rake M.D.   On: 04/29/2019 20:25   Hybrid Or Imaging (mc Only)  Result Date: 04/30/2019 There is no interpretation for this exam.  This order is for images obtained during a surgical procedure.  Please See "Surgeries" Tab for more information regarding the procedure.      Subjective: Patient seen and examined on the day of discharge.  Denies any complaints.  No chest pain or shortness of breath.  He does have some difficulty walking.   Discharge Exam: Vitals:   05/04/19 0822 05/04/19 1134  BP: 135/64 (!) 107/48  Pulse: 60 62  Resp:  15  Temp: 98.3 F (36.8 C) 98.9 F (37.2 C)  SpO2: 97% 98%   Vitals:   05/03/19 2324 05/04/19 0403 05/04/19 0822 05/04/19 1134  BP: (!) 112/48 (!) 128/54 135/64 (!) 107/48  Pulse: 67 66 60 62  Resp: 16 17  15   Temp: 98 F (36.7 C) 97.9 F (36.6 C) 98.3 F (36.8 C) 98.9 F  (37.2 C)  TempSrc: Oral Oral Oral Oral  SpO2: 94% 95% 97% 98%  Weight:  86.6 kg    Height:        General: Pt is alert, awake, not in acute distress, on room air. Cardiovascular: RRR, S1/S2 +, no rubs, no gallops Respiratory: CTA bilaterally, no wheezing, no rhonchi Abdominal: Soft, NT, ND, bowel sounds + Extremities: no edema, no cyanosis, right arm surgical fistula present.  Right IJ permacath present.    The results of significant diagnostics from this hospitalization (including imaging, microbiology, ancillary and laboratory) are listed below for reference.     Microbiology: Recent Results (from the past 240 hour(s))  SARS Coronavirus 2 [facility] order, Performed in [facility] hospital lab) Nasopharyngeal Nasopharyngeal Swab     Status: None   Collection Time: 04/29/19  7:22 PM   Specimen: Nasopharyngeal Swab  Result Value Ref Range Status   SARS Coronavirus 2 NEGATIVE NEGATIVE Final    Comment: (NOTE) If result is NEGATIVE SARS-CoV-2 target nucleic acids are NOT DETECTED. The SARS-CoV-2 RNA is generally detectable in upper and lower  respiratory specimens during the acute phase of infection. The lowest  concentration of SARS-CoV-2 viral copies this assay can detect is 250  copies / mL. A negative result does not preclude SARS-CoV-2 infection  and should not be used as the sole basis for treatment or other  patient management decisions.  A negative result may occur with  improper specimen collection / handling, submission of specimen other  than nasopharyngeal swab, presence of viral mutation(s) within the  areas targeted by this assay, and inadequate number of viral copies  (<250 copies / mL). A negative result must be combined with clinical  observations, patient history, and epidemiological information. If result is POSITIVE SARS-CoV-2 target nucleic acids are DETECTED. The SARS-CoV-2 RNA is generally detectable in upper and lower  respiratory specimens dur ing the  acute phase of infection.  Positive  results are indicative of active infection with SARS-CoV-2.  Clinical  correlation with patient history and other diagnostic information is  necessary to determine patient infection status.  Positive results do  not rule out bacterial infection or co-infection with other viruses. If result is PRESUMPTIVE POSTIVE SARS-CoV-2 nucleic acids MAY BE PRESENT.   A presumptive positive result was obtained on the submitted specimen  and confirmed on repeat testing.  While 2019 novel coronavirus  (SARS-CoV-2) nucleic acids may be present in the submitted sample  additional confirmatory testing may be necessary for epidemiological  and / or clinical management purposes  to differentiate between  SARS-CoV-2 and other Sarbecovirus currently known to infect humans.  If clinically indicated additional testing with an alternate test  methodology [phone]) is advised. The SARS-CoV-2 RNA is generally  detectable in upper and lower respiratory sp ecimens during the acute  phase of infection. The expected result is Negative. Fact Sheet for Patients:  StrictlyIdeas.no  Fact Sheet for Healthcare Providers: BankingDealers.co.za This test is not yet approved or cleared by the [place] FDA and has been authorized for detection and/or diagnosis of SARS-CoV-2 by FDA under an Emergency Use Authorization (EUA).  This EUA will remain in effect (meaning this test can be used) for the duration of the COVID-19 declaration under Section 564(b)(1) of the Act, 21 U.S.C. section 360bbb-3(b)(1), unless the authorization is terminated or revoked sooner. Performed at [facility], [place] [address] [place], [place] [address]   MRSA PCR Screening     Status: None   Collection Time: 04/30/19  8:30 PM   Specimen: Nasal Mucosa; Nasopharyngeal  Result Value Ref Range Status   MRSA by PCR NEGATIVE NEGATIVE Final    Comment:        The  GeneXpert MRSA Assay (FDA approved for NASAL specimens only), is one component of a comprehensive MRSA colonization surveillance program. It is not intended to diagnose MRSA infection nor to guide or monitor treatment for MRSA infections. Performed at [facility], [place] [address], [place], [place] [address]   Culture, blood (routine x 2)     Status: None (Preliminary result)   Collection Time: 04/30/19  8:35 PM   Specimen: BLOOD  Result Value Ref Range Status   Specimen Description BLOOD LEFT HAND  Final   Special Requests   Final    BOTTLES DRAWN AEROBIC ONLY Blood Culture results may not be optimal due to an inadequate volume of blood received in culture bottles   Culture   Final    NO GROWTH 4 DAYS Performed at [facility], [place] [address] [place], [place] [address]    Report Status PENDING  Incomplete  Culture, blood (routine x 2)     Status: None (Preliminary result)   Collection Time: 04/30/19  8:58 PM   Specimen: BLOOD  Result Value Ref Range Status   Specimen Description BLOOD LEFT HAND  Final   Special Requests   Final    BOTTLES DRAWN AEROBIC ONLY Blood Culture results may not be optimal due to an inadequate volume of blood received in culture bottles   Culture   Final    NO GROWTH 4 DAYS Performed at [facility], [place] [address], [place], [place] [address]    Report Status PENDING  Incomplete     Labs: BNP (last 3 results) No results for input(s): BNP in the last 8760 hours. Basic Metabolic Panel: Recent Labs  Lab 04/29/19 1938  04/30/19 0233  05/01/19 0248 05/01/19 1550 05/02/19 0232 05/03/19 0355 05/04/19 0154  NA 143   < > 144   < > 143 138 140  139 139 136  K 5.7*   < > 5.6*   < > 5.0 4.8 4.5  4.4 4.0 4.1  CL 103   < > 107   < > 107 104 106  105 100 98  CO2 15*  --  19*   < > 18* 21* 22  21* 25 24  GLUCOSE 211*   < > 125*   < > 107* 103* 154*  156* 129* 152*  BUN 91*   < > 93*   < > 78* 46* 49*  49* 34* 29*  CREATININE  15.20*   < > 15.37*   < > 12.93* 8.17* 8.52*  8.47* 7.09* 6.03*  CALCIUM 8.5*  --  8.5*   < > 8.4* 8.1* 7.8*  7.8* 8.4* 8.6*  MG 2.2  --  2.2  --  2.2  --  2.1  --   --   PHOS  --   --  8.6*  --  6.8* 4.4 4.9*  --   --    < > = values in this interval not displayed.   Liver Function Tests: Recent Labs  Lab 04/29/19 1938 04/30/19 0233 05/01/19 0248 05/01/19 1550 05/02/19 0232  AST 443*  --   --   --   --   ALT 147*  --   --   --   --   ALKPHOS 168*  --   --   --   --   BILITOT 0.4  --   --   --   --   PROT 7.1  --   --   --   --   ALBUMIN 3.1* 3.0* 3.2* 3.0* 2.6*   No results for input(s): LIPASE, AMYLASE in the last 168 hours. No results for input(s): AMMONIA in the last 168 hours. CBC: Recent Labs  Lab 04/29/19 1938  04/30/19 1838 05/01/19 0248 05/02/19 0232 05/03/19 0355 05/04/19 0154  WBC 7.7   < > 6.4 8.4 6.7 7.3 8.5  NEUTROABS 5.3  --   --   --   --   --   --   HGB 11.4*   < > 11.3* 11.4* 9.5* 10.3* 11.0*  HCT 37.5*   < > 36.3* 36.6* 30.1* 33.1* 35.0*  MCV 93.8   < > 91.9 91.3 92.0 90.7 90.2  PLT 180   < > 147* 161 141* 148* 157   < > = values in this interval not displayed.   Cardiac Enzymes: No results for input(s): CKTOTAL, CKMB, CKMBINDEX, TROPONINI in the last 168 hours. BNP: Invalid input(s): POCBNP CBG: Recent Labs  Lab 05/03/19 1119 05/03/19 1629 05/03/19 2110 05/04/19 0614 05/04/19 1129  GLUCAP 85 192* 219* 192* 104*   D-Dimer No results for input(s): DDIMER in the last 72 hours. Hgb A1c No results for input(s): HGBA1C in the last 72 hours. Lipid Profile No results for input(s): CHOL, HDL, LDLCALC, TRIG, CHOLHDL, LDLDIRECT in the last 72 hours. Thyroid function studies No results for input(s): TSH, T4TOTAL, T3FREE, THYROIDAB in the last 72 hours.  Invalid input(s): FREET3 Anemia work up No results for input(s): VITAMINB12, FOLATE, FERRITIN, TIBC, IRON, RETICCTPCT in the last 72 hours. Urinalysis    Component Value Date/Time    COLORURINE YELLOW 08/01/2015 0642   APPEARANCEUR CLEAR 08/01/2015 0642   LABSPEC 1.013 08/01/2015 0642   PHURINE 8.0 08/01/2015 0642   GLUCOSEU 100 (A) 08/01/2015 0642   HGBUR MODERATE (A) 08/01/2015 0642   BILIRUBINUR NEGATIVE 08/01/2015 0642   KETONESUR NEGATIVE 08/01/2015 0642   PROTEINUR 100 (A) 08/01/2015 0642   UROBILINOGEN 0.2 06/28/2014 1417   NITRITE NEGATIVE 08/01/2015 0642   LEUKOCYTESUR NEGATIVE 08/01/2015 0642   Sepsis Labs Invalid input(s): PROCALCITONIN,  WBC,  LACTICIDVEN Microbiology Recent Results (from the past 240 hour(s))  SARS Coronavirus 2 [facility] order, Performed in [facility] hospital lab) Nasopharyngeal Nasopharyngeal Swab     Status: None   Collection Time: 04/29/19  7:22 PM   Specimen: Nasopharyngeal Swab  Result Value Ref Range Status   SARS Coronavirus 2 NEGATIVE NEGATIVE Final    Comment: (NOTE) If result is NEGATIVE SARS-CoV-2 target nucleic acids are NOT DETECTED. The SARS-CoV-2 RNA is generally detectable in upper and lower  respiratory specimens during the acute phase of infection. The lowest  concentration of SARS-CoV-2 viral copies this assay can  detect is 250  copies / mL. A negative result does not preclude SARS-CoV-2 infection  and should not be used as the sole basis for treatment or other  patient management decisions.  A negative result may occur with  improper specimen collection / handling, submission of specimen other  than nasopharyngeal swab, presence of viral mutation(s) within the  areas targeted by this assay, and inadequate number of viral copies  (<250 copies / mL). A negative result must be combined with clinical  observations, patient history, and epidemiological information. If result is POSITIVE SARS-CoV-2 target nucleic acids are DETECTED. The SARS-CoV-2 RNA is generally detectable in upper and lower  respiratory specimens dur ing the acute phase of infection.  Positive  results are indicative of active infection  with SARS-CoV-2.  Clinical  correlation with patient history and other diagnostic information is  necessary to determine patient infection status.  Positive results do  not rule out bacterial infection or co-infection with other viruses. If result is PRESUMPTIVE POSTIVE SARS-CoV-2 nucleic acids MAY BE PRESENT.   A presumptive positive result was obtained on the submitted specimen  and confirmed on repeat testing.  While 2019 novel coronavirus  (SARS-CoV-2) nucleic acids may be present in the submitted sample  additional confirmatory testing may be necessary for epidemiological  and / or clinical management purposes  to differentiate between  SARS-CoV-2 and other Sarbecovirus currently known to infect humans.  If clinically indicated additional testing with an alternate test  methodology [phone]) is advised. The SARS-CoV-2 RNA is generally  detectable in upper and lower respiratory sp ecimens during the acute  phase of infection. The expected result is Negative. Fact Sheet for Patients:  StrictlyIdeas.no Fact Sheet for Healthcare Providers: BankingDealers.co.za This test is not yet approved or cleared by the [place] FDA and has been authorized for detection and/or diagnosis of SARS-CoV-2 by FDA under an Emergency Use Authorization (EUA).  This EUA will remain in effect (meaning this test can be used) for the duration of the COVID-19 declaration under Section 564(b)(1) of the Act, 21 U.S.C. section 360bbb-3(b)(1), unless the authorization is terminated or revoked sooner. Performed at [facility], [place] [address], [place], [place] [address]   MRSA PCR Screening     Status: None   Collection Time: 04/30/19  8:30 PM   Specimen: Nasal Mucosa; Nasopharyngeal  Result Value Ref Range Status   MRSA by PCR NEGATIVE NEGATIVE Final    Comment:        The GeneXpert MRSA Assay (FDA approved for NASAL specimens only), is one component  of a comprehensive MRSA colonization surveillance program. It is not intended to diagnose MRSA infection nor to guide or monitor treatment for MRSA infections. Performed at [facility], [place] [address], [place], [place] [address]   Culture, blood (routine x 2)     Status: None (Preliminary result)   Collection Time: 04/30/19  8:35 PM   Specimen: BLOOD  Result Value Ref Range Status   Specimen Description BLOOD LEFT HAND  Final   Special Requests   Final    BOTTLES DRAWN AEROBIC ONLY Blood Culture results may not be optimal due to an inadequate volume of blood received in culture bottles   Culture   Final    NO GROWTH 4 DAYS Performed at [facility], [place] [address], [place], [place] [address]    Report Status PENDING  Incomplete  Culture, blood (routine x 2)     Status: None (Preliminary  result)   Collection Time: 04/30/19  8:58 PM   Specimen: BLOOD  Result Value Ref Range Status   Specimen Description BLOOD LEFT HAND  Final   Special Requests   Final    BOTTLES DRAWN AEROBIC ONLY Blood Culture results may not be optimal due to an inadequate volume of blood received in culture bottles   Culture   Final    NO GROWTH 4 DAYS Performed at [facility], [place] [address] [place], [place] [address]    Report Status PENDING  Incomplete     Time coordinating discharge:  35 minutes  SIGNED:   Barb Merino, MD  [facility] 05/04/2019, 1:04 PM

## 2019-05-04 NOTE — TOC Transition Note (Addendum)
Transition of Care [facility]) - CM/SW Discharge Note   Patient Details  Name: Blake [name] MRN: [id number] Date of Birth: 1946-08-22  Transition of Care [facility]) CM/SW Contact:  Pollie Friar, RN Phone Number: 05/04/2019, 1:48 PM   Clinical Narrative:    Pt discharging home with [facility] services through [place]. Cory with [facility] accepted the referral.  Pt has a niece at home that is able to provide some supervision.  Pt states he can not afford the co pay for the Brilinta. MD made aware and pt switched to Plavix.  Pt has transportation home.   Final next level of care: Home w Home Health Services Barriers to Discharge: No Barriers Identified   Patient Goals and CMS Choice   CMS Medicare.gov Compare Post Acute Care list provided to:: Patient Choice offered to / list presented to : Patient  Discharge Placement                       Discharge Plan and Services                          HH Arranged: PT [facility] Agency: [place] Date [place]: 05/04/19   Representative spoke with at [place]: [place] ([place]) Interventions     Readmission Risk Interventions No flowsheet data found.

## 2019-05-05 LAB — BASIC METABOLIC PANEL
Anion gap: 15 (ref 5–15)
BUN: 50 mg/dL — ABNORMAL HIGH (ref 8–23)
CO2: 24 mmol/L (ref 22–32)
Calcium: 8.9 mg/dL (ref 8.9–10.3)
Chloride: 98 mmol/L (ref 98–111)
Creatinine, Ser: 8.78 mg/dL — ABNORMAL HIGH (ref 0.61–1.24)
GFR calc Af Amer: 6 mL/min — ABNORMAL LOW (ref >60)
GFR calc non Af Amer: 5 mL/min — ABNORMAL LOW (ref >60)
Glucose, Bld: 157 mg/dL — ABNORMAL HIGH (ref 70–99)
Potassium: 4.3 mmol/L (ref 3.5–5.1)
Sodium: 137 mmol/L (ref 135–145)

## 2019-05-05 LAB — GLUCOSE, CAPILLARY
Glucose-Capillary: 158 mg/dL — ABNORMAL HIGH (ref 70–99)
Glucose-Capillary: 128 mg/dL — ABNORMAL HIGH (ref 70–99)
Glucose-Capillary: 136 mg/dL — ABNORMAL HIGH (ref 70–99)
Glucose-Capillary: 119 mg/dL — ABNORMAL HIGH (ref 70–99)
Glucose-Capillary: 130 mg/dL — ABNORMAL HIGH (ref 70–99)

## 2019-05-05 LAB — CULTURE, BLOOD (ROUTINE X 2)
Culture: NO GROWTH
Culture: NO GROWTH

## 2019-05-05 MED ORDER — MIDODRINE HCL 5 MG PO TABS
10.0000 mg | ORAL_TABLET | ORAL | Status: DC
  Administered 2019-05-06: 10 mg via ORAL

## 2019-05-05 MED ORDER — MIDODRINE HCL 10 MG PO TABS: 10.0000 mg | ORAL_TABLET | ORAL | 0 refills | Status: AC

## 2019-05-05 NOTE — Progress Notes (Signed)
Patient seen and examined.  He was scheduled for discharge home yesterday, on ambulating he became dizzy and unsteady.  Patient is deconditioned by laying in bed for almost a week.  He is going to get dialysis today.  His symptoms are better today.  He plans to go home and walk with a walker more often.  He has enough support at home.  I discussed case with nephrology.  Plan: Hemodialysis today. Midodrin 10 mg on the day of dialysis. Willing to go home after dialysis today, discharged. He will need orthostatic precautions that were discussed, all-time fall precautions.

## 2019-05-05 NOTE — Progress Notes (Signed)
Subjective:  Seen in room, unable to ambulate 20 feet   Objective Vital signs in last 24 hours: Vitals:   05/05/19 0354 05/05/19 0528 05/05/19 0536 05/05/19 0803  BP: (!) 129/52   (!) 127/49  Pulse: 63   (!) 59  Resp: 15   16  Temp: 97.9 F (36.6 C)   98.7 F (37.1 C)  TempSrc: Oral   Oral  SpO2: 95%   100%  Weight:  87.1 kg 86 kg   Height:       Weight change: 3.3 kg  Physical Exam: General: alert calm NAD chronically ill male Heart: RRR, no m,r,g Lungs: CTA ,Non labored breathing  Abdomen:obese, soft , NT, ND Extremities: no pedal edema , R grt toe amp site dry /healed Dialysis Access:  R IJ Perm cath patent on CRRT currently , R UA AVF pos bruit/thrilll  /stable surgical site at recent plication of avf   [facility]: [facility]  4h  83kg kg (last 2 txs below edw )  2K/3.5CA  Hep 6000 RFA AVF     Hec 2 mcg IV/HD Venofer 50mg  q wkly   No ESA     CXR 8/17 > diffuse IS edema  CXR 8/20 > - resolved IS edema    Assessment/Plan 1. Pulmonary  Edema/ vol overload due to missed HD +  NSTEMI - improved. CXR confirms resolved IS edema. 2. Hypotension - getting metoprolol 12.5 bid for CAD. Consider midodrine pre hd.   3. NSTEMI / CAD- sp LHC 8/18 w/ stent x 2 to LCX and prox LAD 4. ESRD - on HD TTS. SP brief CRRT, now back on iHD. HD today 5. Malfunctioning R AVF - Sp plication [id number], unable to access AVF due to limited space for needles. VVS placed R IJ Perm cath 8/17. Has seen and noted (" Will arrange follow-up in 4 weeks eval avf" 6. Hypertension/volume  - up by wt's but bed scales. No ^vol on exam  7. Anemia  - hgb 11.4  No OP esa , weekly Fe on HD  8. Metabolic bone disease -  Hectorol  On hd and binders 9. DM Type 2 per admit  Kelly Splinter, MD 05/05/2019, 10:46 AM        Labs: Basic Metabolic Panel: Recent Labs  Lab 05/01/19 0248 05/01/19 1550 05/02/19 0232 05/03/19 0355 05/04/19 0154 05/05/19 0355  NA 143 138 140  139 139 136 137  K 5.0 4.8 4.5  4.4 4.0  4.1 4.3  CL 107 104 106  105 100 98 98  CO2 18* 21* 22  21* 25 24 24   GLUCOSE 107* 103* 154*  156* 129* 152* 157*  BUN 78* 46* 49*  49* 34* 29* 50*  CREATININE 12.93* 8.17* 8.52*  8.47* 7.09* 6.03* 8.78*  CALCIUM 8.4* 8.1* 7.8*  7.8* 8.4* 8.6* 8.9  PHOS 6.8* 4.4 4.9*  --   --   --    Liver Function Tests: Recent Labs  Lab 04/29/19 1938  05/01/19 0248 05/01/19 1550 05/02/19 0232  AST 443*  --   --   --   --   ALT 147*  --   --   --   --   ALKPHOS 168*  --   --   --   --   BILITOT 0.4  --   --   --   --   PROT 7.1  --   --   --   --   ALBUMIN 3.1*   < >  3.2* 3.0* 2.6*   < > = values in this interval not displayed.   No results for input(s): LIPASE, AMYLASE in the last 168 hours. No results for input(s): AMMONIA in the last 168 hours. CBC: Recent Labs  Lab 04/29/19 1938  04/30/19 1838 05/01/19 0248 05/02/19 0232 05/03/19 0355 05/04/19 0154  WBC 7.7   < > 6.4 8.4 6.7 7.3 8.5  NEUTROABS 5.3  --   --   --   --   --   --   HGB 11.4*   < > 11.3* 11.4* 9.5* 10.3* 11.0*  HCT 37.5*   < > 36.3* 36.6* 30.1* 33.1* 35.0*  MCV 93.8   < > 91.9 91.3 92.0 90.7 90.2  PLT 180   < > 147* 161 141* 148* 157   < > = values in this interval not displayed.   Cardiac Enzymes: No results for input(s): CKTOTAL, CKMB, CKMBINDEX, TROPONINI in the last 168 hours. CBG: Recent Labs  Lab 05/04/19 0614 05/04/19 1129 05/04/19 1620 05/04/19 2141 05/05/19 0638  GLUCAP 192* 104* 221* 158* 128*    Medications: . sodium chloride Stopped (05/02/19 1500)  . sodium chloride     . aspirin EC  81 mg Oral Daily  . atorvastatin  40 mg Oral QHS  . calcium acetate  1,334 mg Oral TID WC  . Chlorhexidine Gluconate Cloth  6 each Topical Daily  . Chlorhexidine Gluconate Cloth  6 each Topical Q0600  . doxercalciferol  2 mcg Oral Q T,Th,Sa-HD  . heparin injection (subcutaneous)  5,000 Units Subcutaneous Q8H  . insulin aspart  0-5 Units Subcutaneous QHS  . insulin aspart  0-9 Units Subcutaneous TID WC   . mouth rinse  15 mL Mouth Rinse BID  . metoprolol tartrate  12.5 mg Oral BID  . multivitamin  1 tablet Oral QHS  . pantoprazole  40 mg Oral BID  . sodium chloride flush  3 mL Intravenous Q12H  . sodium chloride flush  3 mL Intravenous Q12H  . ticagrelor  90 mg Oral BID

## 2019-05-05 NOTE — Care Management (Signed)
Notified [facility] that patient will DC today and will resume services.

## 2019-05-06 LAB — GLUCOSE, CAPILLARY: Glucose-Capillary: 103 mg/dL — ABNORMAL HIGH (ref 70–99)

## 2019-05-06 MED ORDER — HEPARIN SODIUM (PORCINE) 1000 UNIT/ML IJ SOLN
3.0000 mL | Freq: Once | INTRAMUSCULAR | Status: AC
  Administered 2019-05-06: 3000 [IU] via INTRAVENOUS

## 2019-05-06 MED ORDER — HEPARIN SODIUM (PORCINE) 1000 UNIT/ML IJ SOLN
INTRAMUSCULAR | Status: AC
  Filled 2019-05-06: qty 3

## 2019-05-06 MED ORDER — DOXERCALCIFEROL 0.5 MCG PO CAPS
ORAL_CAPSULE | ORAL | Status: AC
  Filled 2019-05-06: qty 4

## 2019-05-06 MED ORDER — MIDODRINE HCL 5 MG PO TABS
ORAL_TABLET | ORAL | Status: AC
  Filled 2019-05-06: qty 2

## 2019-05-06 NOTE — Progress Notes (Signed)
Consult was placed to IV Team,  wondering if any other care needed to be done to the dialysis catheter before he is discharged; according to the flowsheet, the catheter has been heparinized, and is capped off.  Spoke w Levada Dy, RN, who will let the RN caring for the pt know.   Raynelle Fanning RN  IV Team

## 2019-05-06 NOTE — Progress Notes (Signed)
Patient transported to Dialysis

## 2019-05-06 NOTE — Progress Notes (Signed)
Pt given discharge summary and discharged home via niece as transportation.

## 2019-05-07 ENCOUNTER — Telehealth: Payer: Self-pay | Admitting: Cardiology

## 2019-05-07 NOTE — Telephone Encounter (Signed)
LVM for patient to call and schedule hospital followup and 3 month followup with Dr. Stanford Breed.

## 2019-05-07 NOTE — Telephone Encounter (Signed)
04/29/2019 - 05/06/2019 (7 days) [facility]  [id number] call #1

## 2019-05-08 ENCOUNTER — Telehealth (HOSPITAL_COMMUNITY): Payer: Self-pay

## 2019-05-08 DIAGNOSIS — I214 Non-ST elevation (NSTEMI) myocardial infarction: Secondary | ICD-10-CM | POA: Diagnosis not present

## 2019-05-08 DIAGNOSIS — Z48812 Encounter for surgical aftercare following surgery on the circulatory system: Secondary | ICD-10-CM | POA: Diagnosis not present

## 2019-05-08 DIAGNOSIS — I120 Hypertensive chronic kidney disease with stage 5 chronic kidney disease or end stage renal disease: Secondary | ICD-10-CM | POA: Diagnosis not present

## 2019-05-08 DIAGNOSIS — N186 End stage renal disease: Secondary | ICD-10-CM | POA: Diagnosis not present

## 2019-05-08 DIAGNOSIS — E1122 Type 2 diabetes mellitus with diabetic chronic kidney disease: Secondary | ICD-10-CM | POA: Diagnosis not present

## 2019-05-08 NOTE — Telephone Encounter (Signed)
Patient contacted regarding discharge from8/16/2020 - 05/06/2019 (7 days) [facility]  Patient understands to follow up with provider virtual with Kerin Ransom on 05/16/2019 @ 8:30 AM  Patient understands discharge instructions? YES Patient understands medications and regiment? YES Patient understands to bring all medications to this visit? YES  Pt has dialysis on tues, thurs, sat's. He states that dialysis "takes it all out of him" and he would like a virtual appt. I have scheduled a virtual f/u with [facility] as above. Pt verbalizes that he is doing good and will CB if anything is needed. Pt is very sleepy and I woke him up.

## 2019-05-08 NOTE — Telephone Encounter (Signed)
Pt insurance is active and benefits verified through [facility]. Co-pay $20.00, DED $0.00/$0.00 met, out of pocket $4,500.00/$4,500.00 met, co-insurance 0%. No pre-authorization required. Passport, 05/08/2019 2:14PM, REF#[id number]  Will contact patient to see if he is interested in the Cardiac Rehab Program. If interested, patient will need to complete follow up appt. Once completed, patient will be contacted for scheduling upon review by the RN Navigator.

## 2019-05-08 NOTE — Telephone Encounter (Signed)
Attempted to contact pt in regards to CR, pt has not schedule his follow up appt with his cardiac doctor. LM on VM.

## 2019-05-09 DIAGNOSIS — Z992 Dependence on renal dialysis: Secondary | ICD-10-CM | POA: Diagnosis not present

## 2019-05-09 DIAGNOSIS — D509 Iron deficiency anemia, unspecified: Secondary | ICD-10-CM | POA: Diagnosis not present

## 2019-05-09 DIAGNOSIS — N186 End stage renal disease: Secondary | ICD-10-CM | POA: Diagnosis not present

## 2019-05-09 DIAGNOSIS — N2581 Secondary hyperparathyroidism of renal origin: Secondary | ICD-10-CM | POA: Diagnosis not present

## 2019-05-09 DIAGNOSIS — D689 Coagulation defect, unspecified: Secondary | ICD-10-CM | POA: Diagnosis not present

## 2019-05-10 DIAGNOSIS — D509 Iron deficiency anemia, unspecified: Secondary | ICD-10-CM | POA: Diagnosis not present

## 2019-05-10 DIAGNOSIS — Z992 Dependence on renal dialysis: Secondary | ICD-10-CM | POA: Diagnosis not present

## 2019-05-10 DIAGNOSIS — N2581 Secondary hyperparathyroidism of renal origin: Secondary | ICD-10-CM | POA: Diagnosis not present

## 2019-05-10 DIAGNOSIS — D689 Coagulation defect, unspecified: Secondary | ICD-10-CM | POA: Diagnosis not present

## 2019-05-10 DIAGNOSIS — N186 End stage renal disease: Secondary | ICD-10-CM | POA: Diagnosis not present

## 2019-05-14 DIAGNOSIS — N186 End stage renal disease: Secondary | ICD-10-CM | POA: Diagnosis not present

## 2019-05-14 DIAGNOSIS — Z992 Dependence on renal dialysis: Secondary | ICD-10-CM | POA: Diagnosis not present

## 2019-05-14 DIAGNOSIS — E1122 Type 2 diabetes mellitus with diabetic chronic kidney disease: Secondary | ICD-10-CM | POA: Diagnosis not present

## 2019-05-15 DIAGNOSIS — N2581 Secondary hyperparathyroidism of renal origin: Secondary | ICD-10-CM | POA: Diagnosis not present

## 2019-05-15 DIAGNOSIS — D509 Iron deficiency anemia, unspecified: Secondary | ICD-10-CM | POA: Diagnosis not present

## 2019-05-15 DIAGNOSIS — Z23 Encounter for immunization: Secondary | ICD-10-CM | POA: Diagnosis not present

## 2019-05-15 DIAGNOSIS — D631 Anemia in chronic kidney disease: Secondary | ICD-10-CM | POA: Diagnosis not present

## 2019-05-15 DIAGNOSIS — N186 End stage renal disease: Secondary | ICD-10-CM | POA: Diagnosis not present

## 2019-05-15 DIAGNOSIS — Z992 Dependence on renal dialysis: Secondary | ICD-10-CM | POA: Diagnosis not present

## 2019-05-15 DIAGNOSIS — D689 Coagulation defect, unspecified: Secondary | ICD-10-CM | POA: Diagnosis not present

## 2019-05-16 ENCOUNTER — Telehealth: Payer: Medicare Other | Admitting: Cardiology

## 2019-05-16 ENCOUNTER — Telehealth: Payer: Self-pay

## 2019-05-16 ENCOUNTER — Encounter (HOSPITAL_COMMUNITY): Payer: Self-pay | Admitting: Cardiology

## 2019-05-16 NOTE — Telephone Encounter (Signed)
Left message for patient to contact office to get virtual visit started. Advised patient to contact office.

## 2019-05-16 NOTE — Telephone Encounter (Signed)
Attempted again to reach patient but was unsuccessful. Left message for patient to contact the office.

## 2019-05-17 DIAGNOSIS — D509 Iron deficiency anemia, unspecified: Secondary | ICD-10-CM | POA: Diagnosis not present

## 2019-05-17 DIAGNOSIS — Z992 Dependence on renal dialysis: Secondary | ICD-10-CM | POA: Diagnosis not present

## 2019-05-17 DIAGNOSIS — D689 Coagulation defect, unspecified: Secondary | ICD-10-CM | POA: Diagnosis not present

## 2019-05-17 DIAGNOSIS — D631 Anemia in chronic kidney disease: Secondary | ICD-10-CM | POA: Diagnosis not present

## 2019-05-17 DIAGNOSIS — N2581 Secondary hyperparathyroidism of renal origin: Secondary | ICD-10-CM | POA: Diagnosis not present

## 2019-05-17 DIAGNOSIS — Z23 Encounter for immunization: Secondary | ICD-10-CM | POA: Diagnosis not present

## 2019-05-17 DIAGNOSIS — N186 End stage renal disease: Secondary | ICD-10-CM | POA: Diagnosis not present

## 2019-05-18 DIAGNOSIS — Z48812 Encounter for surgical aftercare following surgery on the circulatory system: Secondary | ICD-10-CM | POA: Diagnosis not present

## 2019-05-18 DIAGNOSIS — I120 Hypertensive chronic kidney disease with stage 5 chronic kidney disease or end stage renal disease: Secondary | ICD-10-CM | POA: Diagnosis not present

## 2019-05-18 DIAGNOSIS — I214 Non-ST elevation (NSTEMI) myocardial infarction: Secondary | ICD-10-CM | POA: Diagnosis not present

## 2019-05-18 DIAGNOSIS — E1122 Type 2 diabetes mellitus with diabetic chronic kidney disease: Secondary | ICD-10-CM | POA: Diagnosis not present

## 2019-05-18 DIAGNOSIS — N186 End stage renal disease: Secondary | ICD-10-CM | POA: Diagnosis not present

## 2019-05-19 DIAGNOSIS — N2581 Secondary hyperparathyroidism of renal origin: Secondary | ICD-10-CM | POA: Diagnosis not present

## 2019-05-19 DIAGNOSIS — D509 Iron deficiency anemia, unspecified: Secondary | ICD-10-CM | POA: Diagnosis not present

## 2019-05-19 DIAGNOSIS — D631 Anemia in chronic kidney disease: Secondary | ICD-10-CM | POA: Diagnosis not present

## 2019-05-19 DIAGNOSIS — D689 Coagulation defect, unspecified: Secondary | ICD-10-CM | POA: Diagnosis not present

## 2019-05-19 DIAGNOSIS — Z23 Encounter for immunization: Secondary | ICD-10-CM | POA: Diagnosis not present

## 2019-05-19 DIAGNOSIS — N186 End stage renal disease: Secondary | ICD-10-CM | POA: Diagnosis not present

## 2019-05-19 DIAGNOSIS — Z992 Dependence on renal dialysis: Secondary | ICD-10-CM | POA: Diagnosis not present

## 2019-05-22 DIAGNOSIS — D631 Anemia in chronic kidney disease: Secondary | ICD-10-CM | POA: Diagnosis not present

## 2019-05-22 DIAGNOSIS — Z23 Encounter for immunization: Secondary | ICD-10-CM | POA: Diagnosis not present

## 2019-05-22 DIAGNOSIS — N186 End stage renal disease: Secondary | ICD-10-CM | POA: Diagnosis not present

## 2019-05-22 DIAGNOSIS — D689 Coagulation defect, unspecified: Secondary | ICD-10-CM | POA: Diagnosis not present

## 2019-05-22 DIAGNOSIS — D509 Iron deficiency anemia, unspecified: Secondary | ICD-10-CM | POA: Diagnosis not present

## 2019-05-22 DIAGNOSIS — N2581 Secondary hyperparathyroidism of renal origin: Secondary | ICD-10-CM | POA: Diagnosis not present

## 2019-05-22 DIAGNOSIS — Z992 Dependence on renal dialysis: Secondary | ICD-10-CM | POA: Diagnosis not present

## 2019-05-24 DIAGNOSIS — Z23 Encounter for immunization: Secondary | ICD-10-CM | POA: Diagnosis not present

## 2019-05-24 DIAGNOSIS — D631 Anemia in chronic kidney disease: Secondary | ICD-10-CM | POA: Diagnosis not present

## 2019-05-24 DIAGNOSIS — Z992 Dependence on renal dialysis: Secondary | ICD-10-CM | POA: Diagnosis not present

## 2019-05-24 DIAGNOSIS — N186 End stage renal disease: Secondary | ICD-10-CM | POA: Diagnosis not present

## 2019-05-24 DIAGNOSIS — D689 Coagulation defect, unspecified: Secondary | ICD-10-CM | POA: Diagnosis not present

## 2019-05-24 DIAGNOSIS — N2581 Secondary hyperparathyroidism of renal origin: Secondary | ICD-10-CM | POA: Diagnosis not present

## 2019-05-24 DIAGNOSIS — D509 Iron deficiency anemia, unspecified: Secondary | ICD-10-CM | POA: Diagnosis not present

## 2019-05-25 DIAGNOSIS — Z48812 Encounter for surgical aftercare following surgery on the circulatory system: Secondary | ICD-10-CM | POA: Diagnosis not present

## 2019-05-25 DIAGNOSIS — I214 Non-ST elevation (NSTEMI) myocardial infarction: Secondary | ICD-10-CM | POA: Diagnosis not present

## 2019-05-25 DIAGNOSIS — N186 End stage renal disease: Secondary | ICD-10-CM | POA: Diagnosis not present

## 2019-05-25 DIAGNOSIS — E1122 Type 2 diabetes mellitus with diabetic chronic kidney disease: Secondary | ICD-10-CM | POA: Diagnosis not present

## 2019-05-26 DIAGNOSIS — N2581 Secondary hyperparathyroidism of renal origin: Secondary | ICD-10-CM | POA: Diagnosis not present

## 2019-05-26 DIAGNOSIS — N186 End stage renal disease: Secondary | ICD-10-CM | POA: Diagnosis not present

## 2019-05-26 DIAGNOSIS — D509 Iron deficiency anemia, unspecified: Secondary | ICD-10-CM | POA: Diagnosis not present

## 2019-05-26 DIAGNOSIS — Z992 Dependence on renal dialysis: Secondary | ICD-10-CM | POA: Diagnosis not present

## 2019-05-26 DIAGNOSIS — Z23 Encounter for immunization: Secondary | ICD-10-CM | POA: Diagnosis not present

## 2019-05-26 DIAGNOSIS — D631 Anemia in chronic kidney disease: Secondary | ICD-10-CM | POA: Diagnosis not present

## 2019-05-26 DIAGNOSIS — D689 Coagulation defect, unspecified: Secondary | ICD-10-CM | POA: Diagnosis not present

## 2019-05-27 NOTE — Progress Notes (Deleted)
  POST OPERATIVE OFFICE NOTE    CC:  F/u for surgery  HPI:  This is a 73 y.o. male who is s/p resection of ulcerated skin and plication of right BC AVF on 04/24/2019 by Dr. Trula Slade.  He subsequently underwent placement of [facility] on 04/30/2019 by Dr. Carlis Abbott.    No Known Allergies  Current Outpatient Medications  Medication Sig Dispense Refill  . aspirin EC 81 MG tablet Take 81 mg by mouth daily.    Marland Kitchen atorvastatin (LIPITOR) 40 MG tablet Take 1 tablet (40 mg total) by mouth at bedtime. 30 tablet 2  . B Complex-C-Folic Acid (NEPHRO VITAMINS) 0.8 MG TABS Take 0.8 mg by mouth daily.     . Calcium Acetate 667 MG TABS Take 667 mg by mouth 3 (three) times daily with meals.     . clopidogrel (PLAVIX) 75 MG tablet Take 1 tablet (75 mg total) by mouth daily. 30 tablet 11  . HYDROcodone-acetaminophen (NORCO) 5-325 MG tablet Take 1 tablet by mouth every 6 (six) hours as needed for moderate pain. 15 tablet 0  . insulin regular (NOVOLIN R RELION) 100 units/mL injection Inject 0-0.09 mLs (0-9 Units total) into the skin 3 (three) times daily with meals. CBG < 70: implement hypoglycemia protocol CBG 70 - 120: 0 units CBG 121 - 150: 1 unit CBG 151 - 200: 2 units CBG 201 - 250: 3 units CBG 251 - 300: 5 units CBG 301 - 350: 7 units CBG 351 - 400: 9 units CBG > 400: call MD. (Patient taking differently: Inject 0-14 Units into the skin 3 (three) times daily with meals. CBG < 70: implement hypoglycemia protocol CBG 70 - 120: 0 units CBG 121 - 150: 4 unit CBG 151 - 200: 6 units CBG 201 - 250: 8 units CBG 251 - 300: 10 units CBG 301 - 350: 12 units CBG 351 - 400: 14 units CBG > 400: call MD.)    . metoprolol tartrate (LOPRESSOR) 25 MG tablet Take 0.5 tablets (12.5 mg total) by mouth 2 (two) times daily. 30 tablet 2  . midodrine (PROAMATINE) 10 MG tablet Take 1 tablet (10 mg total) by mouth Every Tuesday,Thursday,and Saturday with dialysis. 12 tablet 0  . ondansetron (ZOFRAN) 4 MG tablet Take 1 tablet (4 mg  total) by mouth every 6 (six) hours. (Patient taking differently: Take 4 mg by mouth every 8 (eight) hours as needed for nausea. ) 20 tablet 0  . pantoprazole (PROTONIX) 40 MG tablet Take 40 mg by mouth 2 (two) times daily.     Marland Kitchen PARoxetine (PAXIL) 10 MG tablet Take 10 mg by mouth daily.     No current facility-administered medications for this visit.      ROS:  See HPI  Physical Exam:  ***  Incision:  *** Extremities:  *** Neuro: *** Abdomen:  ***  Assessment/Plan:  This is a 73 y.o. male who is s/p:  resection of ulcerated skin and plication of right BC AVF on 04/24/2019 by Dr. Trula Slade.  He subsequently underwent placement of [facility] on 04/30/2019 by Dr. Carlis Abbott.     -***   Leontine Locket, PA-C [facility] [phone]  Clinic MD:  Trula Slade

## 2019-05-29 DIAGNOSIS — Z48812 Encounter for surgical aftercare following surgery on the circulatory system: Secondary | ICD-10-CM | POA: Diagnosis not present

## 2019-05-31 DIAGNOSIS — D689 Coagulation defect, unspecified: Secondary | ICD-10-CM | POA: Diagnosis not present

## 2019-05-31 DIAGNOSIS — Z992 Dependence on renal dialysis: Secondary | ICD-10-CM | POA: Diagnosis not present

## 2019-05-31 DIAGNOSIS — D509 Iron deficiency anemia, unspecified: Secondary | ICD-10-CM | POA: Diagnosis not present

## 2019-05-31 DIAGNOSIS — N186 End stage renal disease: Secondary | ICD-10-CM | POA: Diagnosis not present

## 2019-05-31 DIAGNOSIS — Z23 Encounter for immunization: Secondary | ICD-10-CM | POA: Diagnosis not present

## 2019-05-31 DIAGNOSIS — N2581 Secondary hyperparathyroidism of renal origin: Secondary | ICD-10-CM | POA: Diagnosis not present

## 2019-05-31 DIAGNOSIS — D631 Anemia in chronic kidney disease: Secondary | ICD-10-CM | POA: Diagnosis not present

## 2019-06-02 DIAGNOSIS — D631 Anemia in chronic kidney disease: Secondary | ICD-10-CM | POA: Diagnosis not present

## 2019-06-02 DIAGNOSIS — Z992 Dependence on renal dialysis: Secondary | ICD-10-CM | POA: Diagnosis not present

## 2019-06-02 DIAGNOSIS — Z23 Encounter for immunization: Secondary | ICD-10-CM | POA: Diagnosis not present

## 2019-06-02 DIAGNOSIS — D509 Iron deficiency anemia, unspecified: Secondary | ICD-10-CM | POA: Diagnosis not present

## 2019-06-02 DIAGNOSIS — D689 Coagulation defect, unspecified: Secondary | ICD-10-CM | POA: Diagnosis not present

## 2019-06-02 DIAGNOSIS — N2581 Secondary hyperparathyroidism of renal origin: Secondary | ICD-10-CM | POA: Diagnosis not present

## 2019-06-02 DIAGNOSIS — N186 End stage renal disease: Secondary | ICD-10-CM | POA: Diagnosis not present

## 2019-06-05 DIAGNOSIS — N2581 Secondary hyperparathyroidism of renal origin: Secondary | ICD-10-CM | POA: Diagnosis not present

## 2019-06-05 DIAGNOSIS — Z23 Encounter for immunization: Secondary | ICD-10-CM | POA: Diagnosis not present

## 2019-06-05 DIAGNOSIS — Z992 Dependence on renal dialysis: Secondary | ICD-10-CM | POA: Diagnosis not present

## 2019-06-05 DIAGNOSIS — D689 Coagulation defect, unspecified: Secondary | ICD-10-CM | POA: Diagnosis not present

## 2019-06-05 DIAGNOSIS — N186 End stage renal disease: Secondary | ICD-10-CM | POA: Diagnosis not present

## 2019-06-05 DIAGNOSIS — D631 Anemia in chronic kidney disease: Secondary | ICD-10-CM | POA: Diagnosis not present

## 2019-06-05 DIAGNOSIS — D509 Iron deficiency anemia, unspecified: Secondary | ICD-10-CM | POA: Diagnosis not present

## 2019-06-08 ENCOUNTER — Encounter: Payer: Self-pay | Admitting: Family

## 2019-06-09 ENCOUNTER — Emergency Department (HOSPITAL_COMMUNITY): Payer: Medicare Other

## 2019-06-09 ENCOUNTER — Inpatient Hospital Stay (HOSPITAL_COMMUNITY)
Admission: EM | Admit: 2019-06-09 | Discharge: 2019-06-15 | DRG: 313 | Disposition: A | Discharge status: Home / Self Care | Payer: Medicare Other | Attending: Internal Medicine | Admitting: Internal Medicine

## 2019-06-09 ENCOUNTER — Other Ambulatory Visit: Payer: Self-pay

## 2019-06-09 ENCOUNTER — Encounter (HOSPITAL_COMMUNITY): Payer: Self-pay

## 2019-06-09 DIAGNOSIS — Z79891 Long term (current) use of opiate analgesic: Secondary | ICD-10-CM

## 2019-06-09 DIAGNOSIS — N186 End stage renal disease: Secondary | ICD-10-CM | POA: Diagnosis present

## 2019-06-09 DIAGNOSIS — I493 Ventricular premature depolarization: Secondary | ICD-10-CM | POA: Diagnosis present

## 2019-06-09 DIAGNOSIS — E440 Moderate protein-calorie malnutrition: Secondary | ICD-10-CM | POA: Diagnosis not present

## 2019-06-09 DIAGNOSIS — E1151 Type 2 diabetes mellitus with diabetic peripheral angiopathy without gangrene: Secondary | ICD-10-CM | POA: Diagnosis present

## 2019-06-09 DIAGNOSIS — E46 Unspecified protein-calorie malnutrition: Secondary | ICD-10-CM | POA: Diagnosis present

## 2019-06-09 DIAGNOSIS — J811 Chronic pulmonary edema: Secondary | ICD-10-CM | POA: Insufficient documentation

## 2019-06-09 DIAGNOSIS — E785 Hyperlipidemia, unspecified: Secondary | ICD-10-CM | POA: Diagnosis present

## 2019-06-09 DIAGNOSIS — R0600 Dyspnea, unspecified: Secondary | ICD-10-CM

## 2019-06-09 DIAGNOSIS — R079 Chest pain, unspecified: Secondary | ICD-10-CM | POA: Diagnosis present

## 2019-06-09 DIAGNOSIS — Z794 Long term (current) use of insulin: Secondary | ICD-10-CM

## 2019-06-09 DIAGNOSIS — Z23 Encounter for immunization: Secondary | ICD-10-CM | POA: Diagnosis not present

## 2019-06-09 DIAGNOSIS — Z7901 Long term (current) use of anticoagulants: Secondary | ICD-10-CM

## 2019-06-09 DIAGNOSIS — R0902 Hypoxemia: Secondary | ICD-10-CM | POA: Diagnosis not present

## 2019-06-09 DIAGNOSIS — I252 Old myocardial infarction: Secondary | ICD-10-CM | POA: Diagnosis not present

## 2019-06-09 DIAGNOSIS — R634 Abnormal weight loss: Secondary | ICD-10-CM | POA: Diagnosis not present

## 2019-06-09 DIAGNOSIS — R110 Nausea: Secondary | ICD-10-CM | POA: Diagnosis not present

## 2019-06-09 DIAGNOSIS — E8889 Other specified metabolic disorders: Secondary | ICD-10-CM | POA: Diagnosis not present

## 2019-06-09 DIAGNOSIS — Z992 Dependence on renal dialysis: Secondary | ICD-10-CM | POA: Diagnosis not present

## 2019-06-09 DIAGNOSIS — I10 Essential (primary) hypertension: Secondary | ICD-10-CM | POA: Diagnosis not present

## 2019-06-09 DIAGNOSIS — Z89421 Acquired absence of other right toe(s): Secondary | ICD-10-CM | POA: Diagnosis not present

## 2019-06-09 DIAGNOSIS — R9431 Abnormal electrocardiogram [ECG] [EKG]: Secondary | ICD-10-CM | POA: Diagnosis present

## 2019-06-09 DIAGNOSIS — R627 Adult failure to thrive: Secondary | ICD-10-CM | POA: Diagnosis not present

## 2019-06-09 DIAGNOSIS — Z8249 Family history of ischemic heart disease and other diseases of the circulatory system: Secondary | ICD-10-CM

## 2019-06-09 DIAGNOSIS — I120 Hypertensive chronic kidney disease with stage 5 chronic kidney disease or end stage renal disease: Secondary | ICD-10-CM | POA: Diagnosis present

## 2019-06-09 DIAGNOSIS — L89520 Pressure ulcer of left ankle, unstageable: Secondary | ICD-10-CM | POA: Diagnosis present

## 2019-06-09 DIAGNOSIS — I2510 Atherosclerotic heart disease of native coronary artery without angina pectoris: Secondary | ICD-10-CM | POA: Diagnosis present

## 2019-06-09 DIAGNOSIS — E1169 Type 2 diabetes mellitus with other specified complication: Secondary | ICD-10-CM | POA: Diagnosis not present

## 2019-06-09 DIAGNOSIS — N2581 Secondary hyperparathyroidism of renal origin: Secondary | ICD-10-CM | POA: Diagnosis not present

## 2019-06-09 DIAGNOSIS — E1122 Type 2 diabetes mellitus with diabetic chronic kidney disease: Secondary | ICD-10-CM | POA: Diagnosis not present

## 2019-06-09 DIAGNOSIS — Z7902 Long term (current) use of antithrombotics/antiplatelets: Secondary | ICD-10-CM

## 2019-06-09 DIAGNOSIS — R0789 Other chest pain: Secondary | ICD-10-CM | POA: Diagnosis not present

## 2019-06-09 DIAGNOSIS — Z7982 Long term (current) use of aspirin: Secondary | ICD-10-CM

## 2019-06-09 DIAGNOSIS — Z87891 Personal history of nicotine dependence: Secondary | ICD-10-CM

## 2019-06-09 DIAGNOSIS — D631 Anemia in chronic kidney disease: Secondary | ICD-10-CM | POA: Diagnosis present

## 2019-06-09 DIAGNOSIS — R0602 Shortness of breath: Secondary | ICD-10-CM | POA: Diagnosis not present

## 2019-06-09 DIAGNOSIS — R5381 Other malaise: Secondary | ICD-10-CM | POA: Diagnosis not present

## 2019-06-09 DIAGNOSIS — Z6826 Body mass index (BMI) 26.0-26.9, adult: Secondary | ICD-10-CM

## 2019-06-09 DIAGNOSIS — K2100 Gastro-esophageal reflux disease with esophagitis, without bleeding: Secondary | ICD-10-CM | POA: Diagnosis present

## 2019-06-09 DIAGNOSIS — Z79899 Other long term (current) drug therapy: Secondary | ICD-10-CM

## 2019-06-09 DIAGNOSIS — Z955 Presence of coronary angioplasty implant and graft: Secondary | ICD-10-CM | POA: Diagnosis not present

## 2019-06-09 DIAGNOSIS — R630 Anorexia: Secondary | ICD-10-CM | POA: Diagnosis present

## 2019-06-09 DIAGNOSIS — Z833 Family history of diabetes mellitus: Secondary | ICD-10-CM

## 2019-06-09 DIAGNOSIS — D689 Coagulation defect, unspecified: Secondary | ICD-10-CM | POA: Diagnosis not present

## 2019-06-09 DIAGNOSIS — D509 Iron deficiency anemia, unspecified: Secondary | ICD-10-CM | POA: Diagnosis not present

## 2019-06-09 DIAGNOSIS — R0609 Other forms of dyspnea: Secondary | ICD-10-CM | POA: Diagnosis present

## 2019-06-09 DIAGNOSIS — Z20828 Contact with and (suspected) exposure to other viral communicable diseases: Secondary | ICD-10-CM | POA: Diagnosis present

## 2019-06-09 LAB — COMPREHENSIVE METABOLIC PANEL
ALT: 34 U/L (ref 0–44)
AST: 44 U/L — ABNORMAL HIGH (ref 15–41)
Albumin: 3.1 g/dL — ABNORMAL LOW (ref 3.5–5.0)
Alkaline Phosphatase: 126 U/L (ref 38–126)
Anion gap: 14 (ref 5–15)
BUN: 31 mg/dL — ABNORMAL HIGH (ref 8–23)
CO2: 26 mmol/L (ref 22–32)
Calcium: 8.8 mg/dL — ABNORMAL LOW (ref 8.9–10.3)
Chloride: 98 mmol/L (ref 98–111)
Creatinine, Ser: 7.04 mg/dL — ABNORMAL HIGH (ref 0.61–1.24)
GFR calc Af Amer: 8 mL/min — ABNORMAL LOW (ref >60)
GFR calc non Af Amer: 7 mL/min — ABNORMAL LOW (ref >60)
Glucose, Bld: 140 mg/dL — ABNORMAL HIGH (ref 70–99)
Potassium: 3.4 mmol/L — ABNORMAL LOW (ref 3.5–5.1)
Sodium: 138 mmol/L (ref 135–145)
Total Bilirubin: 0.9 mg/dL (ref 0.3–1.2)
Total Protein: 7.4 g/dL (ref 6.5–8.1)

## 2019-06-09 LAB — CBC WITH DIFFERENTIAL/PLATELET
Abs Immature Granulocytes: 0.05 10*3/uL (ref 0.00–0.07)
Basophils Absolute: 0.1 10*3/uL (ref 0.0–0.1)
Basophils Relative: 1 %
Eosinophils Absolute: 0.2 10*3/uL (ref 0.0–0.5)
Eosinophils Relative: 2 %
HCT: 31.7 % — ABNORMAL LOW (ref 39.0–52.0)
Hemoglobin: 10 g/dL — ABNORMAL LOW (ref 13.0–17.0)
Immature Granulocytes: 1 %
Lymphocytes Relative: 8 %
Lymphs Abs: 0.7 10*3/uL (ref 0.7–4.0)
MCH: 29.2 pg (ref 26.0–34.0)
MCHC: 31.5 g/dL (ref 30.0–36.0)
MCV: 92.7 fL (ref 80.0–100.0)
Monocytes Absolute: 0.7 10*3/uL (ref 0.1–1.0)
Monocytes Relative: 7 %
Neutro Abs: 7.6 10*3/uL (ref 1.7–7.7)
Neutrophils Relative %: 81 %
Platelets: 167 10*3/uL (ref 150–400)
RBC: 3.42 MIL/uL — ABNORMAL LOW (ref 4.22–5.81)
RDW: 14.3 % (ref 11.5–15.5)
WBC: 9.2 10*3/uL (ref 4.0–10.5)
nRBC: 0 % (ref 0.0–0.2)

## 2019-06-09 LAB — BASIC METABOLIC PANEL
Anion gap: 17 — ABNORMAL HIGH (ref 5–15)
BUN: 65 mg/dL — ABNORMAL HIGH (ref 8–23)
CO2: 21 mmol/L — ABNORMAL LOW (ref 22–32)
Calcium: 9 mg/dL (ref 8.9–10.3)
Chloride: 103 mmol/L (ref 98–111)
Creatinine, Ser: 11.61 mg/dL — ABNORMAL HIGH (ref 0.61–1.24)
GFR calc Af Amer: 5 mL/min — ABNORMAL LOW (ref >60)
GFR calc non Af Amer: 4 mL/min — ABNORMAL LOW (ref >60)
Glucose, Bld: 200 mg/dL — ABNORMAL HIGH (ref 70–99)
Potassium: 3.7 mmol/L (ref 3.5–5.1)
Sodium: 141 mmol/L (ref 135–145)

## 2019-06-09 LAB — TROPONIN I (HIGH SENSITIVITY)
Troponin I (High Sensitivity): 53 ng/L — ABNORMAL HIGH (ref <18)
Troponin I (High Sensitivity): 148 ng/L (ref <18)
Troponin I (High Sensitivity): 133 ng/L (ref <18)

## 2019-06-09 LAB — CBG MONITORING, ED: Glucose-Capillary: 118 mg/dL — ABNORMAL HIGH (ref 70–99)

## 2019-06-09 LAB — BRAIN NATRIURETIC PEPTIDE: B Natriuretic Peptide: 747 pg/mL — ABNORMAL HIGH (ref 0.0–100.0)

## 2019-06-09 LAB — SARS CORONAVIRUS 2 (TAT 6-24 HRS): SARS Coronavirus 2: NEGATIVE

## 2019-06-09 MED ORDER — PANTOPRAZOLE SODIUM 40 MG PO TBEC
40.0000 mg | DELAYED_RELEASE_TABLET | Freq: Two times a day (BID) | ORAL | Status: DC
  Administered 2019-06-10 – 2019-06-15 (×12): 40 mg via ORAL
  Filled 2019-06-09 (×12): qty 1

## 2019-06-09 MED ORDER — ACETAMINOPHEN 325 MG PO TABS: 650.0000 mg | ORAL_TABLET | ORAL | Status: DC | PRN

## 2019-06-09 MED ORDER — CALCIUM ACETATE (PHOS BINDER) 667 MG PO CAPS
667.0000 mg | ORAL_CAPSULE | Freq: Three times a day (TID) | ORAL | Status: DC
  Administered 2019-06-10 (×2): 667 mg via ORAL
  Filled 2019-06-09 (×2): qty 1

## 2019-06-09 MED ORDER — ASPIRIN 81 MG PO CHEW: 324.0000 mg | CHEWABLE_TABLET | Freq: Once | ORAL | Status: DC

## 2019-06-09 MED ORDER — HEPARIN SODIUM (PORCINE) 5000 UNIT/ML IJ SOLN
5000.0000 [IU] | Freq: Three times a day (TID) | INTRAMUSCULAR | Status: DC
  Administered 2019-06-10 – 2019-06-15 (×18): 5000 [IU] via SUBCUTANEOUS
  Filled 2019-06-09 (×18): qty 1

## 2019-06-09 MED ORDER — METOPROLOL TARTRATE 12.5 MG HALF TABLET
12.5000 mg | ORAL_TABLET | Freq: Two times a day (BID) | ORAL | Status: DC
  Administered 2019-06-09 – 2019-06-15 (×12): 12.5 mg via ORAL
  Filled 2019-06-09 (×12): qty 1

## 2019-06-09 MED ORDER — ONDANSETRON HCL 4 MG/2ML IJ SOLN: 4.0000 mg | Freq: Four times a day (QID) | INTRAMUSCULAR | Status: DC | PRN

## 2019-06-09 MED ORDER — INSULIN ASPART 100 UNIT/ML ~~LOC~~ SOLN
0.0000 [IU] | SUBCUTANEOUS | Status: DC
  Administered 2019-06-10: 3 [IU] via SUBCUTANEOUS
  Administered 2019-06-10 (×2): 2 [IU] via SUBCUTANEOUS
  Administered 2019-06-10 – 2019-06-11 (×4): 1 [IU] via SUBCUTANEOUS
  Administered 2019-06-12 (×2): 2 [IU] via SUBCUTANEOUS
  Administered 2019-06-12 (×2): 1 [IU] via SUBCUTANEOUS
  Administered 2019-06-14 – 2019-06-15 (×3): 2 [IU] via SUBCUTANEOUS
  Administered 2019-06-15: 09:00:00 1 [IU] via SUBCUTANEOUS
  Administered 2019-06-15: 12:00:00 2 [IU] via SUBCUTANEOUS
  Administered 2019-06-15: 05:00:00 1 [IU] via SUBCUTANEOUS

## 2019-06-09 MED ORDER — PAROXETINE HCL 10 MG PO TABS
10.0000 mg | ORAL_TABLET | Freq: Every day | ORAL | Status: DC
  Administered 2019-06-10 – 2019-06-15 (×6): 10 mg via ORAL
  Filled 2019-06-09 (×6): qty 1

## 2019-06-09 MED ORDER — CLOPIDOGREL BISULFATE 75 MG PO TABS
75.0000 mg | ORAL_TABLET | Freq: Every day | ORAL | Status: DC
  Administered 2019-06-09 – 2019-06-15 (×7): 75 mg via ORAL
  Filled 2019-06-09 (×7): qty 1

## 2019-06-09 MED ORDER — ATORVASTATIN CALCIUM 40 MG PO TABS
40.0000 mg | ORAL_TABLET | Freq: Every day | ORAL | Status: DC
  Administered 2019-06-10 – 2019-06-15 (×6): 40 mg via ORAL
  Filled 2019-06-09 (×6): qty 1

## 2019-06-09 MED ORDER — SODIUM CHLORIDE 0.9% FLUSH
3.0000 mL | Freq: Two times a day (BID) | INTRAVENOUS | Status: DC
  Administered 2019-06-10 – 2019-06-15 (×12): 3 mL via INTRAVENOUS

## 2019-06-09 MED ORDER — NITROGLYCERIN 0.4 MG SL SUBL: 0.4000 mg | SUBLINGUAL_TABLET | SUBLINGUAL | Status: DC | PRN

## 2019-06-09 MED ORDER — SODIUM CHLORIDE 0.9 % IV SOLN: 250.0000 mL | INTRAVENOUS | Status: DC | PRN

## 2019-06-09 MED ORDER — HEPARIN SODIUM (PORCINE) 1000 UNIT/ML IJ SOLN
INTRAMUSCULAR | Status: AC
  Filled 2019-06-09: qty 4

## 2019-06-09 MED ORDER — SODIUM CHLORIDE 0.9% FLUSH: 3.0000 mL | INTRAVENOUS | Status: DC | PRN

## 2019-06-09 MED ORDER — ASPIRIN EC 81 MG PO TBEC
81.0000 mg | DELAYED_RELEASE_TABLET | Freq: Every day | ORAL | Status: DC
  Administered 2019-06-09 – 2019-06-15 (×7): 81 mg via ORAL
  Filled 2019-06-09 (×7): qty 1

## 2019-06-09 MED ORDER — CHLORHEXIDINE GLUCONATE CLOTH 2 % EX PADS
6.0000 | MEDICATED_PAD | Freq: Every day | CUTANEOUS | Status: DC
  Administered 2019-06-10 – 2019-06-13 (×4): 6 via TOPICAL

## 2019-06-09 NOTE — ED Notes (Signed)
Patient returned from dialysis, states not "feeling right". Md notified.

## 2019-06-09 NOTE — ED Provider Notes (Signed)
Patient returned to the ER after dialysis not feeling well. Patient reports a pinching midsternal CP onset during dialysis and continues at this time. Denies [facility], felt nauseous earlier today but not at this time. Physical Exam  BP (!) 141/63   Pulse 65   Temp 97.9 F (36.6 C) (Oral)   Resp 19   Ht 5\' 8"  (1.727 m)   Wt 83.4 kg   SpO2 100%   BMI 27.96 kg/m   Physical Exam Appears to feel unwell, lungs clear, HR RRR, respirations even and unlabored. ED Course/Procedures     Procedures  MDM  Patient continues to feel unwell, has a difficult time explaining this, not having chest pain at this time. Case discussed with Dr. Myna Hidalgo, hospitalist, who will consult for admission.        Tacy Learn, PA-C 06/09/19 2249    Blanchie Dessert, MD 06/10/19 [phone]

## 2019-06-09 NOTE — Progress Notes (Signed)
Progress Note   Blake [name] [id number] DOB: 02-13-1946 DOA: 06/09/2019  PCP: [name], MD Consultants:  Lorrene Reid - nephrology; Stanford Breed - cardiology; Carlis Abbott - vascular  Chief Complaint: SOB  HPI: Blake [name] is a 73 y.o. male with medical history significant of DM; HTN; HLD; ESRD on TTS HD; and  PVD with amputation of R great toe (2016) presenting with SOB.  He was admitted from 8/16-21 with NSTEMI after HD non-compliance.  He underwent stent placement and permacath placement and became hypotensive and unresponsive post-procedure, responding to Narcan.  ED Course:  Likely needs HD - waiting to hear back from nephrology.  SOB, missed Thursday HD.  Feels SOB.  Recent NSTEMI, denies CP; troponin increasing.  Likely volume overload.  On 2-3L [place] O2.  He appears to only need 1 HD session and so may not require admission.   Past Medical History:  Diagnosis Date  . Amputation of right great toe ([place]) 08/02/2015  . Arthritis    "maybe RUE/hand" (02/08/2018)  . ESRD (end stage renal disease) on dialysis [facility])    "TTS; [place]" (02/08/2018)  . Hyperlipidemia   . Hypertension   . Type II diabetes mellitus ([place])     Past Surgical History:  Procedure Laterality Date  . AMPUTATION TOE Right 08/04/2015   Procedure: RIGHT GREAT TOE AMPUTATION;  Surgeon: [name], MD;  Location: [place];  Service: Orthopedics;  Laterality: Right;  . AV FISTULA PLACEMENT Right 07/05/2014   Procedure: ARTERIOVENOUS BRACHIALCEPHALIC (AV) FISTULA CREATION;  Surgeon: Conrad [place], MD;  Location: [place];  Service: Vascular;  Laterality: Right;  . AV FISTULA PLACEMENT Right 04/24/2019   Procedure: REVISION RIGHT ARM ARTERIOVENOUS (AV) FISTULA;  Surgeon: Serafina Mitchell, MD;  Location: [place];  Service: Vascular;  Laterality: Right;  . BACK SURGERY    . CORONARY STENT INTERVENTION N/A 05/01/2019   Procedure: CORONARY STENT INTERVENTION;  Surgeon: Martinique, Peter M, MD;  Location: [place] CV  LAB;  Service: Cardiovascular;  Laterality: N/A;  . ESOPHAGOGASTRODUODENOSCOPY (EGD) WITH PROPOFOL N/A 11/28/2015   Procedure: ESOPHAGOGASTRODUODENOSCOPY (EGD) WITH PROPOFOL;  Surgeon: [name], MD;  Location: [facility];  Service: Endoscopy;  Laterality: N/A;  . INSERTION OF DIALYSIS CATHETER Left 07/05/2014   Procedure: INSERTION OF DIALYSIS CATHETER-LEFT INTERNAL JUGULAR PLACEMENT;  Surgeon: Conrad Groveland, MD;  Location: [place];  Service: Vascular;  Laterality: Left;  . INSERTION OF DIALYSIS CATHETER N/A 04/30/2019   Procedure: INSERTION OF DIALYSIS CATHETER;  Surgeon: Marty Heck, MD;  Location: [facility] OR;  Service: Vascular;  Laterality: N/A;  . LEFT HEART CATH AND CORONARY ANGIOGRAPHY N/A 05/01/2019   Procedure: LEFT HEART CATH AND CORONARY ANGIOGRAPHY;  Surgeon: Martinique, Peter M, MD;  Location: [place] [facility];  Service: Cardiovascular;  Laterality: N/A;  . LUMBAR [place]    . REMOVAL OF A DIALYSIS CATHETER Right 07/05/2014   Procedure: REMOVAL OF A DIALYSIS CATHETER;  Surgeon: Conrad Deputy, MD;  Location: [place];  Service: Vascular;  Laterality: Right;  . ULTRASOUND GUIDANCE FOR VASCULAR ACCESS  04/30/2019   Procedure: Ultrasound Guidance For Vascular Access;  Surgeon: Marty Heck, MD;  Location: [place];  Service: Vascular;;    Social History   Socioeconomic History  . Marital status: Single    Spouse name: Not on file  . Number of children: Not on file  . Years of education: Not on file  . Highest education level: Not on file  Occupational History  .  Not on file  Social Needs  . Financial resource strain: Not on file  . Food insecurity    Worry: Not on file    Inability: Not on file  . Transportation needs    Medical: Not on file    Non-medical: Not on file  Tobacco Use  . Smoking status: Former Smoker    Packs/day: 0.50    Years: 5.00    Pack years: 2.50    Types: Cigarettes    Quit date: 08/24/1979    Years since quitting: 39.8  . Smokeless  tobacco: Never Used  Substance and Sexual Activity  . Alcohol use: Not Currently    Alcohol/week: 0.0 standard drinks    Comment: 02/08/2018 "stopped in my 50's"  . Drug use: Never  . Sexual activity: Not Currently  Lifestyle  . Physical activity    Days per week: Not on file    Minutes per session: Not on file  . Stress: Not on file  Relationships  . Social Herbalist on phone: Not on file    Gets together: Not on file    Attends religious service: Not on file    Active member of club or organization: Not on file    Attends meetings of clubs or organizations: Not on file    Relationship status: Not on file  . Intimate partner violence    Fear of current or ex partner: Not on file    Emotionally abused: Not on file    Physically abused: Not on file    Forced sexual activity: Not on file  Other Topics Concern  . Not on file  Social History Narrative  . Not on file    No Known Allergies  Family History  Problem Relation Age of Onset  . Diabetes Father   . Heart attack Father     Prior to Admission medications   Medication Sig Start Date End Date Taking? Authorizing Provider  aspirin EC 81 MG tablet Take 81 mg by mouth daily.    [provider]  atorvastatin (LIPITOR) 40 MG tablet Take 1 tablet (40 mg total) by mouth at bedtime. 05/04/19 06/03/19  Barb Merino, MD  B Complex-C-Folic Acid (NEPHRO VITAMINS) 0.8 MG TABS Take 0.8 mg by mouth daily.     [provider]  Calcium Acetate 667 MG TABS Take 667 mg by mouth 3 (three) times daily with meals.  02/14/18   [provider]  clopidogrel (PLAVIX) 75 MG tablet Take 1 tablet (75 mg total) by mouth daily. 05/04/19 05/03/20  Barb Merino, MD  HYDROcodone-acetaminophen (NORCO) 5-325 MG tablet Take 1 tablet by mouth every 6 (six) hours as needed for moderate pain. 04/24/19   Dagoberto Ligas, PA-C  insulin regular (NOVOLIN R RELION) 100 units/mL injection Inject 0-0.09 mLs (0-9 Units total) into  the skin 3 (three) times daily with meals. CBG < 70: implement hypoglycemia protocol CBG 70 - 120: 0 units CBG 121 - 150: 1 unit CBG 151 - 200: 2 units CBG 201 - 250: 3 units CBG 251 - 300: 5 units CBG 301 - 350: 7 units CBG 351 - 400: 9 units CBG > 400: call MD. Patient taking differently: Inject 0-14 Units into the skin 3 (three) times daily with meals. CBG < 70: implement hypoglycemia protocol CBG 70 - 120: 0 units CBG 121 - 150: 4 unit CBG 151 - 200: 6 units CBG 201 - 250: 8 units CBG 251 - 300: 10 units CBG 301 -  350: 12 units CBG 351 - 400: 14 units CBG > 400: call MD. 02/13/18   [name], MD  metoprolol tartrate (LOPRESSOR) 25 MG tablet Take 0.5 tablets (12.5 mg total) by mouth 2 (two) times daily. 05/04/19 06/03/19  Barb Merino, MD  ondansetron (ZOFRAN) 4 MG tablet Take 1 tablet (4 mg total) by mouth every 6 (six) hours. Patient taking differently: Take 4 mg by mouth every 8 (eight) hours as needed for nausea.  12/22/18   Orpah Greek, MD  pantoprazole (PROTONIX) 40 MG tablet Take 40 mg by mouth 2 (two) times daily.     [provider]  PARoxetine (PAXIL) 10 MG tablet Take 10 mg by mouth daily.    [provider]    Physical Exam: Vitals:   06/09/19 0915 06/09/19 1017 06/09/19 1018 06/09/19 1136  BP: (!) 139/59 (!) 163/73 (!) 157/81 (!) 141/61  Pulse: 73 (!) 49 79 78  Resp: (!) 28 19 14    Temp:      TempSrc:      SpO2: 100% 96% 99%      Radiological Exams on Admission: Dg Chest Portable 1 View  Result Date: 06/09/2019 CLINICAL DATA:  Shortness of breath. EXAM: PORTABLE CHEST 1 VIEW COMPARISON:  05/03/2019 FINDINGS: 0644 hours. The cardio pericardial silhouette is enlarged. Diffuse interstitial and central alveolar opacity again noted in both lungs. No substantial pleural effusion. Right IJ dialysis catheter is stable. The visualized bony structures of the thorax are intact. Insert wire IMPRESSION: Stable exam. Cardiomegaly with  diffuse interstitial and central alveolar opacity bilaterally. Imaging features may reflect pulmonary edema although underlying chronic infectious/inflammatory etiology not excluded. Electronically Signed   By: Misty Stanley M.D.   On: 06/09/2019 07:45    Assessment/Plan Active Problems:   ESRD (end stage renal disease) ([name])   Fluid overload   After discussion with Dr. Wilson Singer, he thinks that the patient may only need 1 session of HD and then may be appropriate for discharge to home afterwards.  Given this as the case, will defer admission for now.  That said, if nephrology evaluates the patient either before or after HD and determines that the patient would benefit from overnight observation for serial HD, [facility] will be happy to consult at that time.     Karmen Bongo MD [facility]   How to contact the [facility] Attending or Consulting provider [place] or covering provider during after hours [place], for this patient?  1. Check the care team in [facility] and look for a) attending/consulting [facility] provider listed and b) the [facility] team listed 2. Log into [email or web address] and use [place]'s universal password to access. If you do not have the password, please contact the hospital operator. 3. Locate the [facility] provider you are looking for under [facility] and page to a number that you can be directly reached. 4. If you still have difficulty reaching the provider, please page the [facility] (Director on Call) for the Hospitalists listed on amion for assistance.   06/09/2019, 11:42 AM

## 2019-06-09 NOTE — Procedures (Signed)
   I was present at this dialysis session, have reviewed the session itself and made  appropriate changes Kelly Splinter MD [place] pager [phone]   06/09/2019, 5:43 PM

## 2019-06-09 NOTE — ED Notes (Signed)
ED TO INPATIENT HANDOFF REPORT  ED [name] Name and Phone #:  [phone]  S Name/Age/Gender Blake [name] 73 y.o. male Room/Bed: 026C/026C  Code Status   Code Status: Full Code  Home/SNF/Other Home Patient oriented to: self, place, time and situation Is this baseline? Yes   Triage Complete: Triage complete  Chief Complaint dialysis pt/sob  Triage Note Pt from home bib [facility] due to SOB. Patient is a dialysis patient who attends T, Th, Sat session. Pt states he has been SOB since Thursday but this morning SOB increased to the extent he felt the need for supplemental oxygen. On arrival EMS states sats in the 80's put on 3L.    Allergies No Known Allergies  Level of Care/Admitting Diagnosis ED Disposition    ED Disposition Condition [facility] Area: [place] [100100]  Level of Care: Telemetry Cardiac [103]  I expect the patient will be discharged within 24 hours: Yes  LOW acuity---Tx typically complete <24 hrs---ACUTE conditions typically can be evaluated <24 hours---LABS likely to return to acceptable levels <24 hours---IS near functional baseline---EXPECTED to return to current living arrangement---NOT newly hypoxic: Meets criteria for 5C-Observation unit  Covid Evaluation: Confirmed COVID Negative  Diagnosis: Chest pain [id number]  Admitting Physician: Vianne Bulls [id number]  Attending Physician: Vianne Bulls [id number]  PT Class (Do Not Modify): Observation [104]  PT Acc Code (Do Not Modify): Observation [10022]       B Medical/Surgery History Past Medical History:  Diagnosis Date  . Amputation of right great toe ([place]) 08/02/2015  . Arthritis    "maybe RUE/hand" (02/08/2018)  . ESRD (end stage renal disease) on dialysis [facility])    "TTS; [place]" (02/08/2018)  . Hyperlipidemia   . Hypertension   . Type II diabetes mellitus ([place])    Past Surgical History:  Procedure Laterality Date  . AMPUTATION TOE Right 08/04/2015   Procedure: RIGHT GREAT TOE AMPUTATION;  Surgeon: [name], MD;  Location: [place];  Service: Orthopedics;  Laterality: Right;  . AV FISTULA PLACEMENT Right 07/05/2014   Procedure: ARTERIOVENOUS BRACHIALCEPHALIC (AV) FISTULA CREATION;  Surgeon: Conrad C[place] de [place], MD;  Location: [place];  Service: Vascular;  Laterality: Right;  . AV FISTULA PLACEMENT Right 04/24/2019   Procedure: REVISION RIGHT ARM ARTERIOVENOUS (AV) FISTULA;  Surgeon: Serafina Mitchell, MD;  Location: [place];  Service: Vascular;  Laterality: Right;  . BACK SURGERY    . CORONARY STENT INTERVENTION N/A 05/01/2019   Procedure: CORONARY STENT INTERVENTION;  Surgeon: Martinique, Peter M, MD;  Location: [place] [facility];  Service: Cardiovascular;  Laterality: N/A;  . ESOPHAGOGASTRODUODENOSCOPY (EGD) WITH PROPOFOL N/A 11/28/2015   Procedure: ESOPHAGOGASTRODUODENOSCOPY (EGD) WITH PROPOFOL;  Surgeon: [name], MD;  Location: [facility];  Service: Endoscopy;  Laterality: N/A;  . INSERTION OF DIALYSIS CATHETER Left 07/05/2014   Procedure: INSERTION OF DIALYSIS CATHETER-LEFT INTERNAL JUGULAR PLACEMENT;  Surgeon: Conrad P[place]burg, MD;  Location: [place];  Service: Vascular;  Laterality: Left;  . INSERTION OF DIALYSIS CATHETER N/A 04/30/2019   Procedure: INSERTION OF DIALYSIS CATHETER;  Surgeon: Marty Heck, MD;  Location: [facility] OR;  Service: Vascular;  Laterality: N/A;  . LEFT HEART CATH AND CORONARY ANGIOGRAPHY N/A 05/01/2019   Procedure: LEFT HEART CATH AND CORONARY ANGIOGRAPHY;  Surgeon: Martinique, Peter M, MD;  Location: [place] [facility];  Service: Cardiovascular;  Laterality: N/A;  . LUMBAR [place]    . REMOVAL OF A DIALYSIS CATHETER Right 07/05/2014  Procedure: REMOVAL OF A DIALYSIS CATHETER;  Surgeon: Conrad [place], MD;  Location: [place];  Service: Vascular;  Laterality: Right;  . ULTRASOUND GUIDANCE FOR VASCULAR ACCESS  04/30/2019   Procedure: Ultrasound Guidance For Vascular Access;  Surgeon: Marty Heck, MD;  Location: [place];   Service: Vascular;;     A IV Location/Drains/Wounds Patient Lines/Drains/Airways Status   Active Line/Drains/Airways    Name:   Placement date:   Placement time:   Site:   Days:   Peripheral IV 06/09/19 Left Forearm   06/09/19    0659    Forearm   less than 1   Fistula / Graft Right Upper arm Arteriovenous fistula   07/05/14    -    Upper arm   1800   Hemodialysis Catheter Left Internal jugular Double lumen Permanent (Tunneled)   04/30/19    -    Internal jugular   40   Hemodialysis Catheter Right Internal jugular   -    -    Internal jugular      Sheath Right   -    -    -      Incision (Closed) 07/05/14 Arm Right   07/05/14    1518     1800   Incision (Closed) 08/04/15 Foot   08/04/15    1900     1405   Incision (Closed) 04/24/19 Arm Right   04/24/19    1551     46   Incision (Closed) 04/30/19 Neck Right   04/30/19    1425     40   Pressure Injury 05/02/19 Ankle Left;Lateral Unstageable - Full thickness tissue loss in which the base of the ulcer is covered by slough (yellow, tan, gray, green or brown) and/or eschar (tan, brown or black) in the wound bed. scabbed area   05/02/19    1600     38   Wound / Incision (Open or Dehisced) 08/01/15 Diabetic ulcer Toe (Comment  which one) Right   08/01/15    1915    Toe (Comment  which one)   1408          Intake/Output Last 24 hours  Intake/Output Summary (Last 24 hours) at 06/09/2019 2345 Last data filed at 06/09/2019 1915 Gross per 24 hour  Intake -  Output 2172 ml  Net -2172 ml    Labs/Imaging Results for orders placed or performed during the hospital encounter of 06/09/19 (from the past 48 hour(s))  CBC with Differential     Status: Abnormal   Collection Time: 06/09/19  6:50 AM  Result Value Ref Range   WBC 9.2 4.0 - 10.5 K/uL   RBC 3.42 (L) 4.22 - 5.81 MIL/uL   Hemoglobin 10.0 (L) 13.0 - 17.0 g/dL   HCT 31.7 (L) 39.0 - 52.0 %   MCV 92.7 80.0 - 100.0 fL   MCH 29.2 26.0 - 34.0 pg   MCHC 31.5 30.0 - 36.0 g/dL   RDW 14.3 11.5 -  15.5 %   Platelets 167 150 - 400 K/uL   nRBC 0.0 0.0 - 0.2 %   Neutrophils Relative % 81 %   Neutro Abs 7.6 1.7 - 7.7 K/uL   Lymphocytes Relative 8 %   Lymphs Abs 0.7 0.7 - 4.0 K/uL   Monocytes Relative 7 %   Monocytes Absolute 0.7 0.1 - 1.0 K/uL   Eosinophils Relative 2 %   Eosinophils Absolute 0.2 0.0 - 0.5 K/uL   Basophils Relative 1 %  Basophils Absolute 0.1 0.0 - 0.1 K/uL   Immature Granulocytes 1 %   Abs Immature Granulocytes 0.05 0.00 - 0.07 K/uL    Comment: Performed at [facility], [place] [address] [place], [place] [address]  Brain natriuretic peptide     Status: Abnormal   Collection Time: 06/09/19  6:50 AM  Result Value Ref Range   B Natriuretic Peptide 747.0 (H) 0.0 - 100.0 pg/mL    Comment: Performed at [facility] [address] [place], [place] [id number]  Basic metabolic panel     Status: Abnormal   Collection Time: 06/09/19  6:50 AM  Result Value Ref Range   Sodium 141 135 - 145 mmol/L   Potassium 3.7 3.5 - 5.1 mmol/L   Chloride 103 98 - 111 mmol/L   CO2 21 (L) 22 - 32 mmol/L   Glucose, Bld 200 (H) 70 - 99 mg/dL   BUN 65 (H) 8 - 23 mg/dL   Creatinine, Ser 11.61 (H) 0.61 - 1.24 mg/dL   Calcium 9.0 8.9 - 10.3 mg/dL   GFR calc non Af Amer 4 (L) >60 mL/min   GFR calc Af Amer 5 (L) >60 mL/min   Anion gap 17 (H) 5 - 15    Comment: Performed at [facility] [address], [place], [place] [address]  Troponin I (High Sensitivity)     Status: Abnormal   Collection Time: 06/09/19  6:50 AM  Result Value Ref Range   Troponin I (High Sensitivity) 53 (H) <18 ng/L    Comment: (NOTE) Elevated high sensitivity troponin I (hsTnI) values and significant  changes across serial measurements may suggest ACS but many other  chronic and acute conditions are known to elevate hsTnI results.  Refer to the "Links" section for chest pain algorithms and additional  guidance. Performed at [facility], [place] [address], [place], [place] [address]   SARS  CORONAVIRUS 2 (TAT 6-24 HRS) Nasopharyngeal Nasopharyngeal Swab     Status: None   Collection Time: 06/09/19  8:24 AM   Specimen: Nasopharyngeal Swab  Result Value Ref Range   SARS Coronavirus 2 NEGATIVE NEGATIVE    Comment: (NOTE) SARS-CoV-2 target nucleic acids are NOT DETECTED. The SARS-CoV-2 RNA is generally detectable in upper and lower respiratory specimens during the acute phase of infection. Negative results do not preclude SARS-CoV-2 infection, do not rule out co-infections with other pathogens, and should not be used as the sole basis for treatment or other patient management decisions. Negative results must be combined with clinical observations, patient history, and epidemiological information. The expected result is Negative. Fact Sheet for Patients: SugarRoll.be Fact Sheet for Healthcare Providers: https://www.woods-mathews.com/ This test is not yet approved or cleared by the [place] FDA and  has been authorized for detection and/or diagnosis of SARS-CoV-2 by FDA under an Emergency Use Authorization (EUA). This EUA will remain  in effect (meaning this test can be used) for the duration of the COVID-19 declaration under Section 56 4(b)(1) of the Act, 21 U.S.C. section 360bbb-3(b)(1), unless the authorization is terminated or revoked sooner. Performed at [facility], [place] [address] [place], [place] [address]   Troponin I (High Sensitivity)     Status: Abnormal   Collection Time: 06/09/19 10:19 AM  Result Value Ref Range   Troponin I (High Sensitivity) 148 (HH) <18 ng/L    Comment: CRITICAL RESULT CALLED TO, READ BACK BY AND VERIFIED WITH: H HARDY,RN AT 1117 06/09/2019 BY L  BENFIELD (NOTE) Elevated high sensitivity troponin I (hsTnI) values and significant  changes across serial measurements may suggest ACS but many other  chronic and acute conditions are known to elevate hsTnI results.  Refer to the Links section  for chest pain algorithms and additional  guidance. Performed at [facility], [place] [address] [place], [place] [address]   POC CBG, ED     Status: Abnormal   Collection Time: 06/09/19  8:43 PM  Result Value Ref Range   Glucose-Capillary 118 (H) 70 - 99 mg/dL  Troponin I (High Sensitivity)     Status: Abnormal   Collection Time: 06/09/19  9:30 PM  Result Value Ref Range   Troponin I (High Sensitivity) 133 (HH) <18 ng/L    Comment: CRITICAL VALUE NOTED.  VALUE IS CONSISTENT WITH PREVIOUSLY REPORTED AND CALLED VALUE. Performed at [facility], [place] [address], [place], [place] [address]   Comprehensive metabolic panel     Status: Abnormal   Collection Time: 06/09/19  9:30 PM  Result Value Ref Range   Sodium 138 135 - 145 mmol/L   Potassium 3.4 (L) 3.5 - 5.1 mmol/L   Chloride 98 98 - 111 mmol/L   CO2 26 22 - 32 mmol/L   Glucose, Bld 140 (H) 70 - 99 mg/dL   BUN 31 (H) 8 - 23 mg/dL   Creatinine, Ser 7.04 (H) 0.61 - 1.24 mg/dL    Comment: DELTA CHECK NOTED   Calcium 8.8 (L) 8.9 - 10.3 mg/dL   Total Protein 7.4 6.5 - 8.1 g/dL   Albumin 3.1 (L) 3.5 - 5.0 g/dL   AST 44 (H) 15 - 41 U/L   ALT 34 0 - 44 U/L   Alkaline Phosphatase 126 38 - 126 U/L   Total Bilirubin 0.9 0.3 - 1.2 mg/dL   GFR calc non Af Amer 7 (L) >60 mL/min   GFR calc Af Amer 8 (L) >60 mL/min   Anion gap 14 5 - 15    Comment: Performed at [facility], [place] [address] [place], [place] [address]   Dg Chest Portable 1 View  Result Date: 06/09/2019 CLINICAL DATA:  Shortness of breath. EXAM: PORTABLE CHEST 1 VIEW COMPARISON:  05/03/2019 FINDINGS: 0644 hours. The cardio pericardial silhouette is enlarged. Diffuse interstitial and central alveolar opacity again noted in both lungs. No substantial pleural effusion. Right IJ dialysis catheter is stable. The visualized bony structures of the thorax are intact. Insert wire IMPRESSION: Stable exam. Cardiomegaly with diffuse interstitial and central alveolar opacity  bilaterally. Imaging features may reflect pulmonary edema although underlying chronic infectious/inflammatory etiology not excluded. Electronically Signed   By: Misty Stanley M.D.   On: 06/09/2019 07:45    Pending Labs Unresulted Labs (From admission, onward)    Start     Ordered   06/10/19 0500  Hemoglobin A1c  Tomorrow morning,   R    Comments: To assess prior glycemic control    06/09/19 2301   06/10/19 [id number]  Basic metabolic panel  Tomorrow morning,   R     06/09/19 2301   06/10/19 0500  CBC  Tomorrow morning,   R     06/09/19 2301          Vitals/Pain Today's Vitals   06/09/19 2100 06/09/19 2145 06/09/19 2215 06/09/19 2315  BP: (!) 101/41 134/65 (!) 131/57 (!) 172/64  Pulse:  74 65 68  Resp: 16 18 (!) 22 (!) 22  Temp:  TempSrc:      SpO2:  94% 90% 98%  Weight:      Height:      PainSc:    Asleep    Isolation Precautions No active isolations  Medications Medications  clopidogrel (PLAVIX) tablet 75 mg (75 mg Oral Given 06/09/19 1136)  aspirin EC tablet 81 mg (81 mg Oral Given 06/09/19 1136)  metoprolol tartrate (LOPRESSOR) tablet 12.5 mg (12.5 mg Oral Given 06/09/19 1136)  Chlorhexidine Gluconate Cloth 2 % PADS 6 each (has no administration in time range)  heparin 1000 UNIT/ML injection (has no administration in time range)  aspirin chewable tablet 324 mg (has no administration in time range)  atorvastatin (LIPITOR) tablet 40 mg (has no administration in time range)  PARoxetine (PAXIL) tablet 10 mg (has no administration in time range)  Calcium Acetate TABS 667 mg (has no administration in time range)  pantoprazole (PROTONIX) EC tablet 40 mg (has no administration in time range)  nitroGLYCERIN (NITROSTAT) SL tablet 0.4 mg (has no administration in time range)  acetaminophen (TYLENOL) tablet 650 mg (has no administration in time range)  ondansetron (ZOFRAN) injection 4 mg (has no administration in time range)  insulin aspart (novoLOG) injection 0-9 Units (has no  administration in time range)  heparin injection 5,000 Units (has no administration in time range)  sodium chloride flush (NS) 0.9 % injection 3 mL (has no administration in time range)  sodium chloride flush (NS) 0.9 % injection 3 mL (has no administration in time range)  0.9 %  sodium chloride infusion (has no administration in time range)    Mobility walks with device Low fall risk   Focused Assessments Cardiac Assessment Handoff:  Cardiac Rhythm: Normal sinus rhythm Lab Results  Component Value Date   TROPONINI <0.03 12/22/2018   No results found for: DDIMER Does the Patient currently have chest pain? No     R Recommendations: See Admitting Provider Note  Report given to:   Additional Notes:  Pt lives with daughter. Had dialysis today, upstairs, didn't feel well afterwards and is now admitted.

## 2019-06-09 NOTE — Progress Notes (Signed)
Pt BP dropped to low 100's; pt stating he is not feeling good; pt 's condition noted changed from when he came to the unit. Pt was supposed to discharge home; Dr. Roney Jaffe, Nephrology called and made aware; he ordered to return the patient to the ED. Accompanied pt to ED and Louretta Parma made aware pt was on the unit.

## 2019-06-09 NOTE — ED Notes (Signed)
Date and time results received: 06/09/19 1117 (use smartphrase ".now" to insert current time)  Test: trop Critical Value: 148  Name of Provider Notified: [email or web address]  Orders Received? Or Actions Taken?:

## 2019-06-09 NOTE — ED Provider Notes (Signed)
[facility] EMERGENCY DEPARTMENT Provider Note   CSN: [id number] Arrival date & time: 06/09/19  [id number]     History   Chief Complaint Chief Complaint  Patient presents with  . Shortness of Breath    HPI Blake [name] is a 73 y.o. male.  HPI   46yM with dyspnea. Worsening over the past several days. He is unsure of exact timeline. 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 Missed Thursday and last HD was this past Tuesday. Denies any pain. No fever or chills. Mild foot swelling. Hypoxic for EMS in 80s on RA. When I initially saw him he was 100% on 3L [place].  Admission last month with syncope/dyspnea. Found to have abnormal EKG and elevated troponins. Underwent cath and stent placement to L circumflex and LAD.   Past Medical History:  Diagnosis Date  . Acute osteomyelitis of toe of right foot ([place]) 08/02/2015  . Arthritis    "maybe RUE/hand" (02/08/2018)  . ESRD (end stage renal disease) on dialysis [facility])    "TTS; [place]" (02/08/2018)  . Hyperlipidemia   . Hypertension   . Type II diabetes mellitus [facility])    Patient Active Problem List   Diagnosis Date Noted  . Pressure injury of skin 05/04/2019  . NSTEMI (non-ST elevated myocardial infarction) ([place]) 04/30/2019  . Acute encephalopathy 04/29/2019  . Fluid overload 12/08/2018  . Chronic respiratory failure with hypoxia ([place]) 02/24/2018  . Dyslipidemia associated with type 2 diabetes mellitus ([place]) 02/14/2018  . Type 2 diabetes mellitus with hypertension and end stage renal disease on dialysis ([place]) 02/14/2018  . GERD without esophagitis 02/14/2018  . Hypomagnesemia 02/08/2018  . Thrombocytopenia ([place]) 02/07/2018  . Sepsis ([place]) 02/07/2018  . Hypocalcemia 02/07/2018  . Hypokalemia 02/07/2018  . Nausea and vomiting 04/21/2016  . Hyperkalemia 04/21/2016  . Encephalopathy, metabolic [id number]  . Acute pulmonary edema ([name])   . 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 ([place]) 07/07/2014  . Depression  07/07/2014  . Neuropathic pain, leg, bilateral 07/07/2014  . Diabetic retinopathy ([place]) 07/07/2014  . Acute respiratory failure ([place]) 06/28/2014  . ESRD (end stage renal disease) ([place]) 06/27/2014  . 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 Hypoxia 10/17/2012  . Hypertension    Past Surgical History:  Procedure Laterality Date  . AMPUTATION TOE Right 08/04/2015   Procedure: RIGHT GREAT TOE AMPUTATION;  Surgeon: [name], MD;  Location: [facility];  Service: Orthopedics;  Laterality: Right;  . AV FISTULA PLACEMENT Right 07/05/2014   Procedure: ARTERIOVENOUS BRACHIALCEPHALIC (AV) FISTULA CREATION;  Surgeon: Conrad Lakeline, MD;  Location: [facility];  Service: Vascular;  Laterality: Right;  . AV FISTULA PLACEMENT Right 04/24/2019   Procedure: REVISION RIGHT ARM ARTERIOVENOUS (AV) FISTULA;  Surgeon: Serafina Mitchell, MD;  Location: [facility];  Service: Vascular;  Laterality: Right;  . BACK SURGERY    . CORONARY STENT INTERVENTION N/A 05/01/2019   Procedure: CORONARY STENT INTERVENTION;  Surgeon: Martinique, Peter M, MD;  Location: [facility] CV LAB;  Service: Cardiovascular;  Laterality: N/A;  . ESOPHAGOGASTRODUODENOSCOPY (EGD) WITH PROPOFOL N/A 11/28/2015   Procedure: ESOPHAGOGASTRODUODENOSCOPY (EGD) WITH PROPOFOL;  Surgeon: [name], MD;  Location: [facility];  Service: Endoscopy;  Laterality: N/A;  . INSERTION OF DIALYSIS CATHETER Left 07/05/2014   Procedure: INSERTION OF DIALYSIS CATHETER-LEFT INTERNAL JUGULAR PLACEMENT;  Surgeon: Conrad Casper, MD;  Location: [place];  Service: Vascular;  Laterality: Left;  . INSERTION OF DIALYSIS CATHETER N/A 04/30/2019  Procedure: INSERTION OF DIALYSIS CATHETER;  Surgeon: Marty Heck, MD;  Location: [facility] OR;  Service: Vascular;  Laterality: N/A;  . LEFT HEART CATH AND CORONARY ANGIOGRAPHY N/A 05/01/2019   Procedure: LEFT HEART CATH AND CORONARY ANGIOGRAPHY;  Surgeon: Martinique, Peter M, MD;  Location: [place] CV LAB;  Service: Cardiovascular;   Laterality: N/A;  . LUMBAR [place]    . REMOVAL OF A DIALYSIS CATHETER Right 07/05/2014   Procedure: REMOVAL OF A DIALYSIS CATHETER;  Surgeon: Conrad Malin, MD;  Location: [facility];  Service: Vascular;  Laterality: Right;  . ULTRASOUND GUIDANCE FOR VASCULAR ACCESS  04/30/2019   Procedure: Ultrasound Guidance For Vascular Access;  Surgeon: Marty Heck, MD;  Location: [facility];  Service: Vascular;;     Home Medications    Prior to Admission medications   Medication Sig Start Date End Date Taking? Authorizing Provider  aspirin EC 81 MG tablet Take 81 mg by mouth daily.    [provider]  atorvastatin (LIPITOR) 40 MG tablet Take 1 tablet (40 mg total) by mouth at bedtime. 05/04/19 06/03/19  Barb Merino, MD  B Complex-C-Folic Acid (NEPHRO VITAMINS) 0.8 MG TABS Take 0.8 mg by mouth daily.     [provider]  Calcium Acetate 667 MG TABS Take 667 mg by mouth 3 (three) times daily with meals.  02/14/18   [provider]  clopidogrel (PLAVIX) 75 MG tablet Take 1 tablet (75 mg total) by mouth daily. 05/04/19 05/03/20  Barb Merino, MD  HYDROcodone-acetaminophen (NORCO) 5-325 MG tablet Take 1 tablet by mouth every 6 (six) hours as needed for moderate pain. 04/24/19   Dagoberto Ligas, PA-C  insulin regular (NOVOLIN R RELION) 100 units/mL injection Inject 0-0.09 mLs (0-9 Units total) into the skin 3 (three) times daily with meals. CBG < 70: implement hypoglycemia protocol CBG 70 - 120: 0 units CBG 121 - 150: 1 unit CBG 151 - 200: 2 units CBG 201 - 250: 3 units CBG 251 - 300: 5 units CBG 301 - 350: 7 units CBG 351 - 400: 9 units CBG > 400: call MD. Patient taking differently: Inject 0-14 Units into the skin 3 (three) times daily with meals. CBG < 70: implement hypoglycemia protocol CBG 70 - 120: 0 units CBG 121 - 150: 4 unit CBG 151 - 200: 6 units CBG 201 - 250: 8 units CBG 251 - 300: 10 units CBG 301 - 350: 12 units CBG 351 - 400: 14 units CBG > 400: call MD.  02/13/18   [name], MD  metoprolol tartrate (LOPRESSOR) 25 MG tablet Take 0.5 tablets (12.5 mg total) by mouth 2 (two) times daily. 05/04/19 06/03/19  Barb Merino, MD  ondansetron (ZOFRAN) 4 MG tablet Take 1 tablet (4 mg total) by mouth every 6 (six) hours. Patient taking differently: Take 4 mg by mouth every 8 (eight) hours as needed for nausea.  12/22/18   Orpah Greek, MD  pantoprazole (PROTONIX) 40 MG tablet Take 40 mg by mouth 2 (two) times daily.     [provider]  PARoxetine (PAXIL) 10 MG tablet Take 10 mg by mouth daily.    [provider]   Family History Family History  Problem Relation Age of Onset  . Diabetes Father   . Heart attack Father    Social History Social History   Tobacco Use  . Smoking status: Former Smoker    Packs/day: 0.50    Years: 5.00    Pack years:  2.50    Types: Cigarettes    Quit date: 08/24/1979    Years since quitting: 39.8  . Smokeless tobacco: Never Used  Substance Use Topics  . Alcohol use: Not Currently    Alcohol/week: 0.0 standard drinks    Comment: 02/08/2018 "stopped in my 50's"  . Drug use: Never   Allergies   Patient has no known allergies.  Review of Systems Review of Systems  All systems reviewed and negative, other than as noted in HPI.  Physical Exam Updated Vital Signs BP (!) 151/69   Pulse 75   Temp 97.8 F (36.6 C) (Oral)   Resp 19   SpO2 100%   Physical Exam Vitals signs and nursing note reviewed.  Constitutional:      General: He is not in acute distress.    Appearance: He is well-developed.  HENT:     Head: Normocephalic and atraumatic.  Eyes:     General:        Right eye: No discharge.        Left eye: No discharge.     Conjunctiva/sclera: Conjunctivae normal.  Neck:     Musculoskeletal: Neck supple.  Cardiovascular:     Rate and Rhythm: Normal rate and regular rhythm.     Heart sounds: Normal heart sounds. No murmur. No friction rub. No gallop.      Comments:  R dialysis catheter Pulmonary:     Effort: Pulmonary effort is normal. No respiratory distress.     Breath sounds: Normal breath sounds.  Abdominal:     General: There is no distension.     Palpations: Abdomen is soft.     Tenderness: There is no abdominal tenderness.  Musculoskeletal:        General: No tenderness.  Skin:    General: Skin is warm and dry.  Neurological:     Mental Status: He is alert.  Psychiatric:        Behavior: Behavior normal.        Thought Content: Thought content normal.    ED Treatments / Results  Labs (all labs ordered are listed, but only abnormal results are displayed) Labs Reviewed  CBC WITH DIFFERENTIAL/PLATELET - Abnormal; Notable for the following components:      Result Value   RBC 3.42 (*)    Hemoglobin 10.0 (*)    HCT 31.7 (*)    All other components within normal limits  BRAIN NATRIURETIC PEPTIDE - Abnormal; Notable for the following components:   B Natriuretic Peptide 747.0 (*)    All other components within normal limits  BASIC METABOLIC PANEL - Abnormal; Notable for the following components:   CO2 21 (*)    Glucose, Bld 200 (*)    BUN 65 (*)    Creatinine, Ser 11.61 (*)    GFR calc non Af Amer 4 (*)    GFR calc Af Amer 5 (*)    Anion gap 17 (*)    All other components within normal limits  COMPREHENSIVE METABOLIC PANEL - Abnormal; Notable for the following components:   Potassium 3.4 (*)    Glucose, Bld 140 (*)    BUN 31 (*)    Creatinine, Ser 7.04 (*)    Calcium 8.8 (*)    Albumin 3.1 (*)    AST 44 (*)    GFR calc non Af Amer 7 (*)    GFR calc Af Amer 8 (*)    All other components within normal limits  HEMOGLOBIN A1C - Abnormal;  Notable for the following components:   Hgb A1c MFr Bld 6.2 (*)    All other components within normal limits  BASIC METABOLIC PANEL - Abnormal; Notable for the following components:   Potassium 3.1 (*)    BUN 33 (*)    Creatinine, Ser 7.59 (*)    GFR calc non Af Amer 7 (*)    GFR calc Af  Amer 8 (*)    All other components within normal limits  CBC - Abnormal; Notable for the following components:   RBC 3.33 (*)    Hemoglobin 9.7 (*)    HCT 30.1 (*)    All other components within normal limits  GLUCOSE, CAPILLARY - Abnormal; Notable for the following components:   Glucose-Capillary 179 (*)    All other components within normal limits  GLUCOSE, CAPILLARY - Abnormal; Notable for the following components:   Glucose-Capillary 129 (*)    All other components within normal limits  CBG MONITORING, ED - Abnormal; Notable for the following components:   Glucose-Capillary 118 (*)    All other components within normal limits  TROPONIN I (HIGH SENSITIVITY) - Abnormal; Notable for the following components:   Troponin I (High Sensitivity) 53 (*)    All other components within normal limits  TROPONIN I (HIGH SENSITIVITY) - Abnormal; Notable for the following components:   Troponin I (High Sensitivity) 148 (*)    All other components within normal limits  TROPONIN I (HIGH SENSITIVITY) - Abnormal; Notable for the following components:   Troponin I (High Sensitivity) 133 (*)    All other components within normal limits  TROPONIN I (HIGH SENSITIVITY) - Abnormal; Notable for the following components:   Troponin I (High Sensitivity) 109 (*)    All other components within normal limits  SARS CORONAVIRUS 2 (TAT 6-24 HRS)  GLUCOSE, CAPILLARY    EKG EKG Interpretation  Date/Time:  Saturday June 09 2019 06:28:04 EDT Ventricular Rate:  77 PR Interval:    QRS Duration: 85 QT Interval:  420 QTC Calculation: 476 R Axis:   55 Text Interpretation:  Sinus rhythm Nonspecific repol abnormality, anterior leads Borderline prolonged QT interval Confirmed by Virgel Manifold [phone]) on 06/09/2019 7:17:12 AM  Radiology No results found.  Procedures Procedures (including critical care time)  Medications Ordered in ED Medications - No data to display  Initial Impression / Assessment and  Plan / ED Course  I have reviewed the triage vital signs and the nursing notes.  Pertinent labs & imaging results that were available during my care of the patient were reviewed by me and considered in my medical decision making (see chart for details).  72yM with dyspnea. ESRD. Missed HD recently. I think he simply needs dialysis. New oxygen requirement but looks comfortable with good sats on 2L [place]. Troponin elevated but not sure of significance in ESRD. Denies pain. Nephrology consulted for dialysis.   Final Clinical Impressions(s) / ED Diagnoses   Final diagnoses:  Chest pain, unspecified type  Dyspnea, unspecified type  ESRD on dialysis [facility])    ED Discharge Orders    None       Virgel Manifold, MD 06/10/19 [phone]

## 2019-06-09 NOTE — Progress Notes (Signed)
Asked to see this patient for SOB and mild early CHF. Requiring nasal O2 in the ED.  Asked to see for dialysis. Pt is not in distress, slight rales on exam and no LE edema.   Plan HD upstairs this afternoon and if improved post-HD will plan dc home.    Kelly Splinter, MD 06/09/2019, 5:42 PM

## 2019-06-09 NOTE — ED Triage Notes (Signed)
Pt from home bib [facility] due to SOB. Patient is a dialysis patient who attends T, Th, Sat session. Pt states he has been SOB since Thursday but this morning SOB increased to the extent he felt the need for supplemental oxygen. On arrival EMS states sats in the 80's put on 3L.

## 2019-06-09 NOTE — H&P (Addendum)
History and Physical    Blake [name] [id number] DOB: October 18, 1945 DOA: 06/09/2019  PCP: [name], MD   Patient coming from: Home   Chief Complaint: chest pain, malaise   HPI: Blake [name] is a 73 y.o. male with medical history significant for coronary artery disease, end-stage renal disease on hemodialysis, and insulin-dependent diabetes mellitus, now presenting to the emergency department for evaluation of malaise.  Patient had been in the emergency department at 6:30 AM this morning with shortness of breath and acute hypoxic respiratory failure secondary to pulmonary edema after missing his last dialysis session.  He was noted to have elevated troponin in the ED this morning that went from 53-148.  Patient was not having any chest pain this morning and the elevated troponin was suspected to be secondary to his volume overload.  He was dialyzed in the hospital today, complained of "not feeling well" during dialysis and it was noted that his systolic blood pressure had come down to 100.  He also complained of a "pinching" chest pain during dialysis that he had not experienced previously. This was localized to central chest, not associated with SOB or diaphoresis, and resolved after a couple minutes.  Patient was returned to the emergency department after dialysis, is no longer hypoxic, has not had recurrence in that chest pain, but feels generally unwell.  ED Course: Upon arrival to the ED, patient is found to be afebrile, saturating mid 90s on room air, and with remaining vitals also normal.  EKG features of sinus rhythm with PVCs.  Troponin was repeated in the ED, still elevated at 133, down from the measurement 12 hours ago in the ED, but he has had chest pain in the interim and hospitalists are consulted for admission.  Review of Systems:  All other systems reviewed and apart from HPI, are negative.  Past Medical History:  Diagnosis Date   Amputation of right great  toe ([place]) 08/02/2015   Arthritis    "maybe RUE/hand" (02/08/2018)   ESRD (end stage renal disease) on dialysis ([place])    "TTS; [place]" (02/08/2018)   Hyperlipidemia    Hypertension    Type II diabetes mellitus ([place])     Past Surgical History:  Procedure Laterality Date   AMPUTATION TOE Right 08/04/2015   Procedure: RIGHT GREAT TOE AMPUTATION;  Surgeon: [name], MD;  Location: [facility];  Service: Orthopedics;  Laterality: Right;   AV FISTULA PLACEMENT Right 07/05/2014   Procedure: ARTERIOVENOUS BRACHIALCEPHALIC (AV) FISTULA CREATION;  Surgeon: Conrad [place], MD;  Location: [facility];  Service: Vascular;  Laterality: Right;   AV FISTULA PLACEMENT Right 04/24/2019   Procedure: REVISION RIGHT ARM ARTERIOVENOUS (AV) FISTULA;  Surgeon: Serafina Mitchell, MD;  Location: [facility];  Service: Vascular;  Laterality: Right;   BACK SURGERY     CORONARY STENT INTERVENTION N/A 05/01/2019   Procedure: CORONARY STENT INTERVENTION;  Surgeon: Martinique, Peter M, MD;  Location: [facility];  Service: Cardiovascular;  Laterality: N/A;   ESOPHAGOGASTRODUODENOSCOPY (EGD) WITH PROPOFOL N/A 11/28/2015   Procedure: ESOPHAGOGASTRODUODENOSCOPY (EGD) WITH PROPOFOL;  Surgeon: [name], MD;  Location: [facility];  Service: Endoscopy;  Laterality: N/A;   INSERTION OF DIALYSIS CATHETER Left 07/05/2014   Procedure: INSERTION OF DIALYSIS CATHETER-LEFT INTERNAL JUGULAR PLACEMENT;  Surgeon: Conrad Norvelt, MD;  Location: [facility];  Service: Vascular;  Laterality: Left;   INSERTION OF DIALYSIS CATHETER N/A 04/30/2019   Procedure: INSERTION OF DIALYSIS CATHETER;  Surgeon: Marty Heck, MD;  Location[facility];  Service: Vascular;  Laterality: N/A;   LEFT HEART CATH AND CORONARY ANGIOGRAPHY N/A 05/01/2019   Procedure: LEFT HEART CATH AND CORONARY ANGIOGRAPHY;  Surgeon: Martinique, Peter M, MD;  Location: [facility];  Service: Cardiovascular;  Laterality: N/A;   LUMBAR DISC SURGERY     REMOVAL OF A  DIALYSIS CATHETER Right 07/05/2014   Procedure: REMOVAL OF A DIALYSIS CATHETER;  Surgeon: Conrad Maunawili, MD;  Location: [facility];  Service: Vascular;  Laterality: Right;   ULTRASOUND GUIDANCE FOR VASCULAR ACCESS  04/30/2019   Procedure: Ultrasound Guidance For Vascular Access;  Surgeon: Marty Heck, MD;  Location: [facility];  Service: Vascular;;     reports that he quit smoking about 39 years ago. His smoking use included cigarettes. He has a 2.50 pack-year smoking history. He has never used smokeless tobacco. He reports previous alcohol use. He reports that he does not use drugs.  No Known Allergies  Family History  Problem Relation Age of Onset   Diabetes Father    Heart attack Father      Prior to Admission medications   Medication Sig Start Date End Date Taking? Authorizing Provider  aspirin EC 81 MG tablet Take 81 mg by mouth daily.    [provider]  atorvastatin (LIPITOR) 40 MG tablet Take 1 tablet (40 mg total) by mouth at bedtime. 05/04/19 06/09/19  Barb Merino, MD  B Complex-C-Folic Acid (NEPHRO VITAMINS) 0.8 MG TABS Take 0.8 mg by mouth daily.     [provider]  Calcium Acetate 667 MG TABS Take 667 mg by mouth 3 (three) times daily with meals.  02/14/18   [provider]  clopidogrel (PLAVIX) 75 MG tablet Take 1 tablet (75 mg total) by mouth daily. 05/04/19 05/03/20  Barb Merino, MD  HYDROcodone-acetaminophen (NORCO) 5-325 MG tablet Take 1 tablet by mouth every 6 (six) hours as needed for moderate pain. 04/24/19   Dagoberto Ligas, PA-C  insulin regular (NOVOLIN R RELION) 100 units/mL injection Inject 0-0.09 mLs (0-9 Units total) into the skin 3 (three) times daily with meals. CBG < 70: implement hypoglycemia protocol CBG 70 - 120: 0 units CBG 121 - 150: 1 unit CBG 151 - 200: 2 units CBG 201 - 250: 3 units CBG 251 - 300: 5 units CBG 301 - 350: 7 units CBG 351 - 400: 9 units CBG > 400: call MD. Patient taking differently: Inject 0-14 Units  into the skin 3 (three) times daily with meals. CBG < 70: implement hypoglycemia protocol CBG 70 - 120: 0 units CBG 121 - 150: 4 unit CBG 151 - 200: 6 units CBG 201 - 250: 8 units CBG 251 - 300: 10 units CBG 301 - 350: 12 units CBG 351 - 400: 14 units CBG > 400: call MD. 02/13/18   [name], MD  metoprolol tartrate (LOPRESSOR) 25 MG tablet Take 0.5 tablets (12.5 mg total) by mouth 2 (two) times daily. 05/04/19 06/03/19  Barb Merino, MD  ondansetron (ZOFRAN) 4 MG tablet Take 1 tablet (4 mg total) by mouth every 6 (six) hours. Patient taking differently: Take 4 mg by mouth every 8 (eight) hours as needed for nausea.  12/22/18   Orpah Greek, MD  pantoprazole (PROTONIX) 40 MG tablet Take 40 mg by mouth 2 (two) times daily.     [provider]  PARoxetine (PAXIL) 10 MG tablet Take 10 mg by mouth daily.    [provider]  Physical Exam: Vitals:   06/09/19 2030 06/09/19 2045 06/09/19 2100 06/09/19 2145  BP: 121/63 119/78 (!) 101/41 134/65  Pulse:    74  Resp: 20 19 16 18   Temp:      TempSrc:      SpO2:    94%  Weight:      Height:        Constitutional: NAD, calm, no pallor, no diaphoresis  Eyes: PERTLA, lids and conjunctivae normal ENMT: Mucous membranes are moist. Posterior pharynx clear of any exudate or lesions.   Neck: normal, supple, no masses, no thyromegaly Respiratory:  no wheezing, no crackles. Normal respiratory effort. No accessory muscle use.  Cardiovascular: S1 & S2 heard, regular rate and rhythm. No extremity edema.   Abdomen: No distension, no tenderness, soft. Bowel sounds active.  Musculoskeletal: no clubbing / cyanosis. No joint deformity upper and lower extremities. S/p right great toe amputation.   Skin: no significant rashes, lesions, ulcers. Warm, dry, well-perfused. Neurologic: CN 2-12 grossly intact. Sensation intact, DTR normal. Strength 5/5 in all 4 limbs.  Psychiatric: Alert and oriented to person, place, and  situation. Pleasant, cooperative.   Labs on Admission: I have personally reviewed following labs and imaging studies  CBC: Recent Labs  Lab 06/09/19 0650  WBC 9.2  NEUTROABS 7.6  HGB 10.0*  HCT 31.7*  MCV 92.7  PLT [id number]   Basic Metabolic Panel: Recent Labs  Lab 06/09/19 0650  NA 141  K 3.7  CL 103  CO2 21*  GLUCOSE 200*  BUN 65*  CREATININE 11.61*  CALCIUM 9.0   GFR: Estimated Creatinine Clearance: 6.1 mL/min (A) (by C-G formula based on SCr of 11.61 mg/dL (H)). Liver Function Tests: No results for input(s): AST, ALT, ALKPHOS, BILITOT, PROT, ALBUMIN in the last 168 hours. No results for input(s): LIPASE, AMYLASE in the last 168 hours. No results for input(s): AMMONIA in the last 168 hours. Coagulation Profile: No results for input(s): INR, PROTIME in the last 168 hours. Cardiac Enzymes: No results for input(s): CKTOTAL, CKMB, CKMBINDEX, TROPONINI in the last 168 hours. BNP (last 3 results) No results for input(s): PROBNP in the last 8760 hours. HbA1C: No results for input(s): HGBA1C in the last 72 hours. CBG: Recent Labs  Lab 06/09/19 2043  GLUCAP 118*   Lipid Profile: No results for input(s): CHOL, HDL, LDLCALC, TRIG, CHOLHDL, LDLDIRECT in the last 72 hours. Thyroid Function Tests: No results for input(s): TSH, T4TOTAL, FREET4, T3FREE, THYROIDAB in the last 72 hours. Anemia Panel: No results for input(s): VITAMINB12, FOLATE, FERRITIN, TIBC, IRON, RETICCTPCT in the last 72 hours. Urine analysis:    Component Value Date/Time   COLORURINE YELLOW 08/01/2015 0642   APPEARANCEUR CLEAR 08/01/2015 0642   LABSPEC 1.013 08/01/2015 0642   PHURINE 8.0 08/01/2015 0642   GLUCOSEU 100 (A) 08/01/2015 0642   HGBUR MODERATE (A) 08/01/2015 0642   BILIRUBINUR NEGATIVE 08/01/2015 0642   KETONESUR NEGATIVE 08/01/2015 0642   PROTEINUR 100 (A) 08/01/2015 0642   UROBILINOGEN 0.2 06/28/2014 1417   NITRITE NEGATIVE 08/01/2015 0642   LEUKOCYTESUR NEGATIVE 08/01/2015 0642    Sepsis Labs: @LABRCNTIP (procalcitonin:4,lacticidven:4) ) Recent Results (from the past 240 hour(s))  SARS CORONAVIRUS 2 (TAT 6-24 HRS) Nasopharyngeal Nasopharyngeal Swab     Status: None   Collection Time: 06/09/19  8:24 AM   Specimen: Nasopharyngeal Swab  Result Value Ref Range Status   SARS Coronavirus 2 NEGATIVE NEGATIVE Final    Comment: (NOTE) SARS-CoV-2 target nucleic acids are NOT DETECTED. The SARS-CoV-2 RNA is  generally detectable in upper and lower respiratory specimens during the acute phase of infection. Negative results do not preclude SARS-CoV-2 infection, do not rule out co-infections with other pathogens, and should not be used as the sole basis for treatment or other patient management decisions. Negative results must be combined with clinical observations, patient history, and epidemiological information. The expected result is Negative. Fact Sheet for Patients: SugarRoll.be Fact Sheet for Healthcare Providers: https://www.woods-mathews.com/ This test is not yet approved or cleared by the [place] FDA and  has been authorized for detection and/or diagnosis of SARS-CoV-2 by FDA under an Emergency Use Authorization (EUA). This EUA will remain  in effect (meaning this test can be used) for the duration of the COVID-19 declaration under Section 56 4(b)(1) of the Act, 21 U.S.C. section 360bbb-3(b)(1), unless the authorization is terminated or revoked sooner. Performed at [facility], [place] [address], [place], [place] [address]      Radiological Exams on Admission: Dg Chest Portable 1 View  Result Date: 06/09/2019 CLINICAL DATA:  Shortness of breath. EXAM: PORTABLE CHEST 1 VIEW COMPARISON:  05/03/2019 FINDINGS: 0644 hours. The cardio pericardial silhouette is enlarged. Diffuse interstitial and central alveolar opacity again noted in both lungs. No substantial pleural effusion. Right IJ dialysis catheter is  stable. The visualized bony structures of the thorax are intact. Insert wire IMPRESSION: Stable exam. Cardiomegaly with diffuse interstitial and central alveolar opacity bilaterally. Imaging features may reflect pulmonary edema although underlying chronic infectious/inflammatory etiology not excluded. Electronically Signed   By: Misty Stanley M.D.   On: 06/09/2019 07:45    EKG: Independently reviewed. Sinus rhythm, PVC's.   Assessment/Plan   1. Chest pain; CAD   - Seen in ED early this am with SOB and hypoxia d/t pulm edema after missing HD, had elevated troponin then without chest pain, hypoxia resolved with HD, but patient developed general malaise and had a fleeting sharp chest pain during HD so was sent back to ED  - Repeat troponin is 133 now, down from this am but he was not having chest pain then but did have an episode of CP in the interim  - He had cath 1 month ago with DES to LCx and LAD, also had 40% stenosis involving RCA  - He ran out of ASA a few days ago but otherwise reports compliance with his medications  - Continue cardiac monitoring, check another troponin, treat with ASA 324 now, continue DAPT, statin, and beta-blocker, repeat EKG    2. ESRD  - Was hypoxic in ED this am after missing HD, now on rm air with no dyspnea after HD  - SLIV, renally-dose medications   3. Insulin-dependent DM  - A1c was 6.5% in March 2020  - Continue low-intensity SSI     PPE: Mask, face shield  DVT prophylaxis: sq heparin  Code Status: Full  Family Communication: Discussed with patient  Consults called: none Admission status: Observation     Vianne Bulls, MD [facility] Pager [phone]  If 7PM-7AM, please contact night-coverage [email or web address] Password [email or web address]  06/09/2019, 11:00 PM

## 2019-06-10 ENCOUNTER — Inpatient Hospital Stay (HOSPITAL_COMMUNITY): Payer: Medicare Other

## 2019-06-10 DIAGNOSIS — N2581 Secondary hyperparathyroidism of renal origin: Secondary | ICD-10-CM | POA: Diagnosis not present

## 2019-06-10 DIAGNOSIS — E440 Moderate protein-calorie malnutrition: Secondary | ICD-10-CM | POA: Diagnosis not present

## 2019-06-10 DIAGNOSIS — I252 Old myocardial infarction: Secondary | ICD-10-CM | POA: Diagnosis not present

## 2019-06-10 DIAGNOSIS — R488 Other symbolic dysfunctions: Secondary | ICD-10-CM | POA: Diagnosis not present

## 2019-06-10 DIAGNOSIS — E118 Type 2 diabetes mellitus with unspecified complications: Secondary | ICD-10-CM | POA: Diagnosis not present

## 2019-06-10 DIAGNOSIS — Z955 Presence of coronary angioplasty implant and graft: Secondary | ICD-10-CM | POA: Diagnosis not present

## 2019-06-10 DIAGNOSIS — E1122 Type 2 diabetes mellitus with diabetic chronic kidney disease: Secondary | ICD-10-CM | POA: Diagnosis not present

## 2019-06-10 DIAGNOSIS — J811 Chronic pulmonary edema: Secondary | ICD-10-CM | POA: Diagnosis not present

## 2019-06-10 DIAGNOSIS — E785 Hyperlipidemia, unspecified: Secondary | ICD-10-CM | POA: Diagnosis present

## 2019-06-10 DIAGNOSIS — I959 Hypotension, unspecified: Secondary | ICD-10-CM | POA: Diagnosis not present

## 2019-06-10 DIAGNOSIS — R279 Unspecified lack of coordination: Secondary | ICD-10-CM | POA: Diagnosis not present

## 2019-06-10 DIAGNOSIS — R110 Nausea: Secondary | ICD-10-CM | POA: Diagnosis present

## 2019-06-10 DIAGNOSIS — D631 Anemia in chronic kidney disease: Secondary | ICD-10-CM | POA: Diagnosis not present

## 2019-06-10 DIAGNOSIS — E46 Unspecified protein-calorie malnutrition: Secondary | ICD-10-CM | POA: Diagnosis not present

## 2019-06-10 DIAGNOSIS — E1169 Type 2 diabetes mellitus with other specified complication: Secondary | ICD-10-CM | POA: Diagnosis present

## 2019-06-10 DIAGNOSIS — R5381 Other malaise: Secondary | ICD-10-CM | POA: Diagnosis not present

## 2019-06-10 DIAGNOSIS — I2510 Atherosclerotic heart disease of native coronary artery without angina pectoris: Secondary | ICD-10-CM | POA: Diagnosis not present

## 2019-06-10 DIAGNOSIS — E8889 Other specified metabolic disorders: Secondary | ICD-10-CM | POA: Diagnosis present

## 2019-06-10 DIAGNOSIS — I493 Ventricular premature depolarization: Secondary | ICD-10-CM | POA: Diagnosis present

## 2019-06-10 DIAGNOSIS — J9611 Chronic respiratory failure with hypoxia: Secondary | ICD-10-CM | POA: Diagnosis not present

## 2019-06-10 DIAGNOSIS — J810 Acute pulmonary edema: Secondary | ICD-10-CM | POA: Diagnosis not present

## 2019-06-10 DIAGNOSIS — I10 Essential (primary) hypertension: Secondary | ICD-10-CM | POA: Diagnosis not present

## 2019-06-10 DIAGNOSIS — R109 Unspecified abdominal pain: Secondary | ICD-10-CM | POA: Diagnosis not present

## 2019-06-10 DIAGNOSIS — I120 Hypertensive chronic kidney disease with stage 5 chronic kidney disease or end stage renal disease: Secondary | ICD-10-CM | POA: Diagnosis not present

## 2019-06-10 DIAGNOSIS — R112 Nausea with vomiting, unspecified: Secondary | ICD-10-CM | POA: Diagnosis not present

## 2019-06-10 DIAGNOSIS — R079 Chest pain, unspecified: Secondary | ICD-10-CM | POA: Diagnosis not present

## 2019-06-10 DIAGNOSIS — E7849 Other hyperlipidemia: Secondary | ICD-10-CM | POA: Diagnosis not present

## 2019-06-10 DIAGNOSIS — R630 Anorexia: Secondary | ICD-10-CM | POA: Diagnosis present

## 2019-06-10 DIAGNOSIS — R2689 Other abnormalities of gait and mobility: Secondary | ICD-10-CM | POA: Diagnosis not present

## 2019-06-10 DIAGNOSIS — R627 Adult failure to thrive: Secondary | ICD-10-CM | POA: Diagnosis not present

## 2019-06-10 DIAGNOSIS — I214 Non-ST elevation (NSTEMI) myocardial infarction: Secondary | ICD-10-CM | POA: Diagnosis not present

## 2019-06-10 DIAGNOSIS — R0600 Dyspnea, unspecified: Secondary | ICD-10-CM | POA: Diagnosis not present

## 2019-06-10 DIAGNOSIS — Z992 Dependence on renal dialysis: Secondary | ICD-10-CM | POA: Diagnosis not present

## 2019-06-10 DIAGNOSIS — Z743 Need for continuous supervision: Secondary | ICD-10-CM | POA: Diagnosis not present

## 2019-06-10 DIAGNOSIS — L89520 Pressure ulcer of left ankle, unstageable: Secondary | ICD-10-CM | POA: Diagnosis present

## 2019-06-10 DIAGNOSIS — R634 Abnormal weight loss: Secondary | ICD-10-CM | POA: Diagnosis not present

## 2019-06-10 DIAGNOSIS — M6281 Muscle weakness (generalized): Secondary | ICD-10-CM | POA: Diagnosis not present

## 2019-06-10 DIAGNOSIS — Z20828 Contact with and (suspected) exposure to other viral communicable diseases: Secondary | ICD-10-CM | POA: Diagnosis present

## 2019-06-10 DIAGNOSIS — Z89421 Acquired absence of other right toe(s): Secondary | ICD-10-CM | POA: Diagnosis not present

## 2019-06-10 DIAGNOSIS — R0609 Other forms of dyspnea: Secondary | ICD-10-CM | POA: Diagnosis present

## 2019-06-10 DIAGNOSIS — R9431 Abnormal electrocardiogram [ECG] [EKG]: Secondary | ICD-10-CM | POA: Diagnosis present

## 2019-06-10 DIAGNOSIS — N186 End stage renal disease: Secondary | ICD-10-CM | POA: Diagnosis not present

## 2019-06-10 DIAGNOSIS — K2100 Gastro-esophageal reflux disease with esophagitis, without bleeding: Secondary | ICD-10-CM | POA: Diagnosis present

## 2019-06-10 DIAGNOSIS — R0602 Shortness of breath: Secondary | ICD-10-CM | POA: Diagnosis not present

## 2019-06-10 DIAGNOSIS — M1389 Other specified arthritis, multiple sites: Secondary | ICD-10-CM | POA: Diagnosis not present

## 2019-06-10 DIAGNOSIS — R0789 Other chest pain: Secondary | ICD-10-CM | POA: Diagnosis not present

## 2019-06-10 DIAGNOSIS — D649 Anemia, unspecified: Secondary | ICD-10-CM | POA: Diagnosis not present

## 2019-06-10 DIAGNOSIS — E1151 Type 2 diabetes mellitus with diabetic peripheral angiopathy without gangrene: Secondary | ICD-10-CM | POA: Diagnosis present

## 2019-06-10 LAB — CBC
HCT: 30.1 % — ABNORMAL LOW (ref 39.0–52.0)
Hemoglobin: 9.7 g/dL — ABNORMAL LOW (ref 13.0–17.0)
MCH: 29.1 pg (ref 26.0–34.0)
MCHC: 32.2 g/dL (ref 30.0–36.0)
MCV: 90.4 fL (ref 80.0–100.0)
Platelets: 157 10*3/uL (ref 150–400)
RBC: 3.33 MIL/uL — ABNORMAL LOW (ref 4.22–5.81)
RDW: 14.2 % (ref 11.5–15.5)
WBC: 6.6 10*3/uL (ref 4.0–10.5)
nRBC: 0 % (ref 0.0–0.2)

## 2019-06-10 LAB — GLUCOSE, CAPILLARY
Glucose-Capillary: 129 mg/dL — ABNORMAL HIGH (ref 70–99)
Glucose-Capillary: 145 mg/dL — ABNORMAL HIGH (ref 70–99)
Glucose-Capillary: 179 mg/dL — ABNORMAL HIGH (ref 70–99)
Glucose-Capillary: 193 mg/dL — ABNORMAL HIGH (ref 70–99)
Glucose-Capillary: 220 mg/dL — ABNORMAL HIGH (ref 70–99)
Glucose-Capillary: 73 mg/dL (ref 70–99)

## 2019-06-10 LAB — BASIC METABOLIC PANEL
Anion gap: 13 (ref 5–15)
BUN: 33 mg/dL — ABNORMAL HIGH (ref 8–23)
CO2: 28 mmol/L (ref 22–32)
Calcium: 9 mg/dL (ref 8.9–10.3)
Chloride: 99 mmol/L (ref 98–111)
Creatinine, Ser: 7.59 mg/dL — ABNORMAL HIGH (ref 0.61–1.24)
GFR calc Af Amer: 8 mL/min — ABNORMAL LOW (ref >60)
GFR calc non Af Amer: 7 mL/min — ABNORMAL LOW (ref >60)
Glucose, Bld: 94 mg/dL (ref 70–99)
Potassium: 3.1 mmol/L — ABNORMAL LOW (ref 3.5–5.1)
Sodium: 140 mmol/L (ref 135–145)

## 2019-06-10 LAB — HEMOGLOBIN A1C
Hgb A1c MFr Bld: 6.2 % — ABNORMAL HIGH (ref 4.8–5.6)
Mean Plasma Glucose: 131.24 mg/dL

## 2019-06-10 LAB — TROPONIN I (HIGH SENSITIVITY): Troponin I (High Sensitivity): 109 ng/L (ref <18)

## 2019-06-10 MED ORDER — POTASSIUM CHLORIDE CRYS ER 20 MEQ PO TBCR
40.0000 meq | EXTENDED_RELEASE_TABLET | Freq: Once | ORAL | Status: AC
  Administered 2019-06-10: 04:00:00 40 meq via ORAL
  Filled 2019-06-10: qty 2

## 2019-06-10 MED ORDER — IOHEXOL 350 MG/ML SOLN
75.0000 mL | Freq: Once | INTRAVENOUS | Status: AC | PRN
  Administered 2019-06-10: 15:00:00 60 mL via INTRAVENOUS

## 2019-06-10 MED ORDER — SEVELAMER CARBONATE 800 MG PO TABS
2400.0000 mg | ORAL_TABLET | Freq: Three times a day (TID) | ORAL | Status: DC
  Administered 2019-06-10 – 2019-06-15 (×11): 2400 mg via ORAL
  Filled 2019-06-10 (×13): qty 3

## 2019-06-10 MED ORDER — DOXERCALCIFEROL 4 MCG/2ML IV SOLN
2.0000 ug | INTRAVENOUS | Status: DC
  Administered 2019-06-13 – 2019-06-14 (×2): 2 ug via INTRAVENOUS
  Filled 2019-06-10 (×2): qty 2

## 2019-06-10 NOTE — Progress Notes (Signed)
PROGRESS NOTE    Blake [name]  [id number] DOB: 01/10/1946 DOA: 06/09/2019 PCP: [name], MD      Brief Narrative:  Mr. Blake [name] is a 73 y.o. 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  In the ER, the patient had elevated BNP from baseline, appeared weak and dyspneic.  Was dialyzed from the ER with plans for d/c home but was persistently dyspneic and nearly syncopized and reported chest pain.       Assessment & Plan:  Chest pain, noncardiac This was transient, non-exertional, clinically not suspicious for ACS.  Has been adherent to Plavix.  Troponin low and flat.  Telemetry shows high PVC burden only.   Fatigue and dyspnea Not from anemia.  EF normal on echo 1 month ago.  BNP slightly up but no fluid overload on exam.  Dimer elevated but this is nonspecific.  ECG personally repeated and reviewed, no ischemic changes.  No clinical or laboratory findings to point to infection. -Obtain CT angiogram chest to rule out PE  ESRD -Continue phoslo -Consult Nephrology for routine HD  Diabetes Glucoses normal. -Continue SSI corrections  Coronary disease secondary prevention S/p DES x2 in Aug 2020 Hypertension BP labile.  Troponins low and flat, consistent with poor clearance from renal disease. -Continue aspirin, Plavix -Continue atorvastatin -Continue metoprolol  Unstageable pressure injury left lateral ankle, POA  Other meds -Continue SSRI -Continue PPI  Anemia of CKD No active bleeding, Hgb stable relative to baseline.      MDM and disposition: The below labs and imaging reports were reviewed and summarized above.  Medication management as above.  The patient was admitted with fatigue, shortness of breath, weakness.     At this time, continued inpatient services are reasonable and expected, given the patient's  age, co-morbid chronic renal failure and recent MI, ongoing dyspnea such that he cannot walk from one side of the room to the other, and the high likelihood of an adverse outcome, including readmission, debility or death if the patient were to be discharged prematurely.      DVT prophylaxis: Heparin Code Status: FULL Family Communication:     Procedures:   CTA chest 9/27 ---    Subjective: Very tired.  Appetite improved.  No vomiting.  No fever.  No cough, sputum.  No abdominal pain.  No chest pain at all anymore.  Just very fatigued, OOB with any exertion.  Unable to walk all the way around the bed due to fatigue.  Objective: Vitals:   06/09/19 2315 06/10/19 0020 06/10/19 0034 06/10/19 0410  BP: (!) 172/64 (!) 161/66 (!) 161/66 (!) 136/57  Pulse: 68 67 69 70  Resp: (!) 22 15  17   Temp:  98.6 F (37 C)  98.5 F (36.9 C)  TempSrc:  Oral  Oral  SpO2: 98% 95%  96%  Weight:    77.2 kg  Height:    5\' 8"  (1.727 m)    Intake/Output Summary (Last 24 hours) at 06/10/2019 1311 Last data filed at 06/10/2019 0956 Gross per 24 hour  Intake 180 ml  Output 2172 ml  Net -1992 ml   Filed Weights   06/09/19 1340 06/10/19 0410  Weight: 83.4 kg 77.2 kg    Examination: General appearance: thin elderly adult male, awake but appears tired, no obvious distress.   HEENT: Anicteric, conjunctiva pink,  lids and lashes normal. No nasal deformity, discharge, epistaxis.  Lips moist, OP dry, no oral lesions, hearing normal.     Skin: Warm and dry.  No jaundice.  No suspicious rashes or lesions on face, neck, arms, abdomen, legs. Cardiac: RRR, nl S1-S2, no murmurs appreciated.  Capillary refill is brisk.  No JVD.  No LE edema.  Radial pulses 2+ and symmetric. Respiratory: Tachypneic, appears dyspneic at rest.  CTAB without rales or wheezes. Abdomen: Abdomen soft.  No TTP or guarding. No ascites, distension, hepatosplenomegaly.   MSK: No deformities or effusions. Neuro: Awake and alert.  EOMI,  moves all extremities. Speech fluent.    Psych: Sensorium intact and responding to questions, attention normal. Affect normal.  Judgment and insight appear normal.    Data Reviewed: I have personally reviewed following labs and imaging studies:  CBC: Recent Labs  Lab 06/09/19 0650 06/10/19 0237  WBC 9.2 6.6  NEUTROABS 7.6  --   HGB 10.0* 9.7*  HCT 31.7* 30.1*  MCV 92.7 90.4  PLT 167 [id number]   Basic Metabolic Panel: Recent Labs  Lab 06/09/19 0650 06/09/19 2130 06/10/19 0237  NA 141 138 140  K 3.7 3.4* 3.1*  CL 103 98 99  CO2 21* 26 28  GLUCOSE 200* 140* 94  BUN 65* 31* 33*  CREATININE 11.61* 7.04* 7.59*  CALCIUM 9.0 8.8* 9.0   GFR: Estimated Creatinine Clearance: 8.6 mL/min (A) (by C-G formula based on SCr of 7.59 mg/dL (H)). Liver Function Tests: Recent Labs  Lab 06/09/19 2130  AST 44*  ALT 34  ALKPHOS 126  BILITOT 0.9  PROT 7.4  ALBUMIN 3.1*   No results for input(s): LIPASE, AMYLASE in the last 168 hours. No results for input(s): AMMONIA in the last 168 hours. Coagulation Profile: No results for input(s): INR, PROTIME in the last 168 hours. Cardiac Enzymes: No results for input(s): CKTOTAL, CKMB, CKMBINDEX, TROPONINI in the last 168 hours. BNP (last 3 results) No results for input(s): PROBNP in the last 8760 hours. HbA1C: Recent Labs    06/10/19 0237  HGBA1C 6.2*   CBG: Recent Labs  Lab 06/09/19 2043 06/10/19 0042 06/10/19 0423 06/10/19 0759 06/10/19 1127  GLUCAP 118* 179* 73 129* 193*   Lipid Profile: No results for input(s): CHOL, HDL, LDLCALC, TRIG, CHOLHDL, LDLDIRECT in the last 72 hours. Thyroid Function Tests: No results for input(s): TSH, T4TOTAL, FREET4, T3FREE, THYROIDAB in the last 72 hours. Anemia Panel: No results for input(s): VITAMINB12, FOLATE, FERRITIN, TIBC, IRON, RETICCTPCT in the last 72 hours. Urine analysis:    Component Value Date/Time   COLORURINE YELLOW 08/01/2015 0642   APPEARANCEUR CLEAR 08/01/2015 0642    LABSPEC 1.013 08/01/2015 0642   PHURINE 8.0 08/01/2015 0642   GLUCOSEU 100 (A) 08/01/2015 0642   HGBUR MODERATE (A) 08/01/2015 0642   BILIRUBINUR NEGATIVE 08/01/2015 0642   KETONESUR NEGATIVE 08/01/2015 0642   PROTEINUR 100 (A) 08/01/2015 0642   UROBILINOGEN 0.2 06/28/2014 1417   NITRITE NEGATIVE 08/01/2015 0642   LEUKOCYTESUR NEGATIVE 08/01/2015 0642   Sepsis Labs: @LABRCNTIP (procalcitonin:4,lacticacidven:4)  ) Recent Results (from the past 240 hour(s))  SARS CORONAVIRUS 2 (TAT 6-24 HRS) Nasopharyngeal Nasopharyngeal Swab     Status: None   Collection Time: 06/09/19  8:24 AM   Specimen: Nasopharyngeal Swab  Result Value Ref Range Status   SARS Coronavirus 2 NEGATIVE NEGATIVE Final    Comment: (NOTE) SARS-CoV-2 target nucleic acids are NOT DETECTED. The SARS-CoV-2 RNA is generally detectable in upper and lower respiratory specimens during  the acute phase of infection. Negative results do not preclude SARS-CoV-2 infection, do not rule out co-infections with other pathogens, and should not be used as the sole basis for treatment or other patient management decisions. Negative results must be combined with clinical observations, patient history, and epidemiological information. The expected result is Negative. Fact Sheet for Patients: SugarRoll.be Fact Sheet for Healthcare Providers: https://www.woods-mathews.com/ This test is not yet approved or cleared by the [place] FDA and  has been authorized for detection and/or diagnosis of SARS-CoV-2 by FDA under an Emergency Use Authorization (EUA). This EUA will remain  in effect (meaning this test can be used) for the duration of the COVID-19 declaration under Section 56 4(b)(1) of the Act, 21 U.S.C. section 360bbb-3(b)(1), unless the authorization is terminated or revoked sooner. Performed at [facility], [place] [address] [place], [place] [address]          Radiology  Studies: Dg Chest Portable 1 View  Result Date: 06/09/2019 CLINICAL DATA:  Shortness of breath. EXAM: PORTABLE CHEST 1 VIEW COMPARISON:  05/03/2019 FINDINGS: 0644 hours. The cardio pericardial silhouette is enlarged. Diffuse interstitial and central alveolar opacity again noted in both lungs. No substantial pleural effusion. Right IJ dialysis catheter is stable. The visualized bony structures of the thorax are intact. Insert wire IMPRESSION: Stable exam. Cardiomegaly with diffuse interstitial and central alveolar opacity bilaterally. Imaging features may reflect pulmonary edema although underlying chronic infectious/inflammatory etiology not excluded. Electronically Signed   By: Misty Stanley M.D.   On: 06/09/2019 07:45        Scheduled Meds: . aspirin  324 mg Oral Once  . aspirin EC  81 mg Oral Daily  . atorvastatin  40 mg Oral q1800  . calcium acetate  667 mg Oral TID WC  . Chlorhexidine Gluconate Cloth  6 each Topical Q0600  . clopidogrel  75 mg Oral Daily  . heparin  5,000 Units Subcutaneous Q8H  . insulin aspart  0-9 Units Subcutaneous Q4H  . metoprolol tartrate  12.5 mg Oral BID  . pantoprazole  40 mg Oral BID  . PARoxetine  10 mg Oral Daily  . sodium chloride flush  3 mL Intravenous Q12H   Continuous Infusions: . sodium chloride       LOS: 0 days    Time spent: 35 minutes    Edwin Dada, MD [facility] 06/10/2019, 1:11 PM     Please page through [place]:  [email or web address] Password [email or web address] If 7PM-7AM, please contact night-coverage

## 2019-06-10 NOTE — Consult Note (Addendum)
[facility] Renal Consultation Note    Indication for Consultation:  Management of ESRD/hemodialysis, anemia, hypertension/volume, and secondary hyperparathyroidism. PCP:  HPI: Blake [name] is a 73 y.o. male with a history of ESRD on dialysis, HTN, HLD, and T2DM who presented to the ED on 06/09/2019 with shortness of breath. He reported worsening SOB and edema over the past several days and had missed his dialysis session on 06/07/2019. On presentation to the ED, K+ 3.7, BUN 65, Cr 11.61, WBC 9,1m Hgb 10.0.  Chest x-ray revealed diffuse interstitial and central alveolar opacity bilaterally. He was hypoxic on RA with EMS. He does have a history of CAD and underwent heart cath with stenting in 04/2019. Initial plan was to have HD then discharge, however patient developed chest tightness during dialysis. He returned to the ED after treatment and continued to feel generally unwell, and was subsequently admitted.  Today, patient reports he is feeling better. Denies and CP, palpitations, or shortness of breath at present. He does endorse intermittent episodes of DOE for the past several months, and reports EDW as to be lowered for his treatment on Saturday, but he felt too weak and came to the ED instead. He did miss treatment on Thursday. Denies abdominal pain, nausea, vomiting, diarrhea. Denies edema but does report he has had some pedal edema after dialysis recently. Primary has been trending troponins, which have improved since admission 148 > 133 > 109. K+ after HD this Am was 3.1. Hgb 9.7.   Past Medical History:  Diagnosis Date  . Amputation of right great toe ([place]) 08/02/2015  . Arthritis    "maybe RUE/hand" (02/08/2018)  . ESRD (end stage renal disease) on dialysis [facility])    "TTS; [place]" (02/08/2018)  . Hyperlipidemia   . Hypertension   . Type II diabetes mellitus ([place])    Past Surgical History:  Procedure Laterality Date  . AMPUTATION TOE Right 08/04/2015   Procedure:  RIGHT GREAT TOE AMPUTATION;  Surgeon: [name], MD;  Location: [place];  Service: Orthopedics;  Laterality: Right;  . AV FISTULA PLACEMENT Right 07/05/2014   Procedure: ARTERIOVENOUS BRACHIALCEPHALIC (AV) FISTULA CREATION;  Surgeon: Conrad Millbrae, MD;  Location: [place];  Service: Vascular;  Laterality: Right;  . AV FISTULA PLACEMENT Right 04/24/2019   Procedure: REVISION RIGHT ARM ARTERIOVENOUS (AV) FISTULA;  Surgeon: Serafina Mitchell, MD;  Location: [place];  Service: Vascular;  Laterality: Right;  . BACK SURGERY    . CORONARY STENT INTERVENTION N/A 05/01/2019   Procedure: CORONARY STENT INTERVENTION;  Surgeon: Martinique, Peter M, MD;  Location: [place] CV [facility];  Service: Cardiovascular;  Laterality: N/A;  . ESOPHAGOGASTRODUODENOSCOPY (EGD) WITH PROPOFOL N/A 11/28/2015   Procedure: ESOPHAGOGASTRODUODENOSCOPY (EGD) WITH PROPOFOL;  Surgeon: [name], MD;  Location: [facility];  Service: Endoscopy;  Laterality: N/A;  . INSERTION OF DIALYSIS CATHETER Left 07/05/2014   Procedure: INSERTION OF DIALYSIS CATHETER-LEFT INTERNAL JUGULAR PLACEMENT;  Surgeon: Conrad Darling, MD;  Location: [place];  Service: Vascular;  Laterality: Left;  . INSERTION OF DIALYSIS CATHETER N/A 04/30/2019   Procedure: INSERTION OF DIALYSIS CATHETER;  Surgeon: Marty Heck, MD;  Location: [facility];  Service: Vascular;  Laterality: N/A;  . LEFT HEART CATH AND CORONARY ANGIOGRAPHY N/A 05/01/2019   Procedure: LEFT HEART CATH AND CORONARY ANGIOGRAPHY;  Surgeon: Martinique, Peter M, MD;  Location: [place] [facility];  Service: Cardiovascular;  Laterality: N/A;  . LUMBAR [place]    . REMOVAL OF A DIALYSIS CATHETER  Right 07/05/2014   Procedure: REMOVAL OF A DIALYSIS CATHETER;  Surgeon: Conrad , MD;  Location: [place];  Service: Vascular;  Laterality: Right;  . ULTRASOUND GUIDANCE FOR VASCULAR ACCESS  04/30/2019   Procedure: Ultrasound Guidance For Vascular Access;  Surgeon: Marty Heck, MD;  Location: [facility] OR;  Service:  Vascular;;   Family History  Problem Relation Age of Onset  . Diabetes Father   . Heart attack Father    Social History:  reports that he quit smoking about 39 years ago. His smoking use included cigarettes. He has a 2.50 pack-year smoking history. He has never used smokeless tobacco. He reports previous alcohol use. He reports that he does not use drugs.  ROS: As per HPI otherwise negative.  Physical Exam: Vitals:   06/09/19 2315 06/10/19 0020 06/10/19 0034 06/10/19 0410  BP: (!) 172/64 (!) 161/66 (!) 161/66 (!) 136/57  Pulse: 68 67 69 70  Resp: (!) 22 15  17   Temp:  98.6 F (37 C)  98.5 F (36.9 C)  TempSrc:  Oral  Oral  SpO2: 98% 95%  96%  Weight:    77.2 kg  Height:    5\' 8"  (1.727 m)     General: Well developed, well nourished, in no acute distress. Head: Normocephalic, atraumatic, sclera non-icteric, mucus membranes are moist. Neck: JVD not elevated. Lungs: Clear bilaterally to auscultation without wheezes, rales, or rhonchi. Breathing is unlabored. Heart: RRR with normal S1, S2. No murmurs, rubs, or gallops appreciated. Abdomen: Soft, non-tender, non-distended with normoactive bowel sounds. No rebound/guarding. No obvious abdominal masses. Musculoskeletal:  Strength and tone appear normal for age. S/p R great toe amputation Lower extremities: No edema or ischemic changes, no open wounds. Neuro: Alert and oriented X 3. Moves all extremities spontaneously. Psych:  Responds to questions appropriately with a normal affect. Dialysis Access: [facility] without erythema/drainage   No Known Allergies Prior to Admission medications   Medication Sig Start Date End Date Taking? Authorizing Provider  aspirin EC 81 MG tablet Take 81 mg by mouth daily.    [provider]  atorvastatin (LIPITOR) 40 MG tablet Take 1 tablet (40 mg total) by mouth at bedtime. 05/04/19 06/09/19  Barb Merino, MD  B Complex-C-Folic Acid (NEPHRO VITAMINS) 0.8 MG TABS Take 0.8 mg by mouth daily.      [provider]  Calcium Acetate 667 MG TABS Take 667 mg by mouth 3 (three) times daily with meals.  02/14/18   [provider]  clopidogrel (PLAVIX) 75 MG tablet Take 1 tablet (75 mg total) by mouth daily. 05/04/19 05/03/20  Barb Merino, MD  HYDROcodone-acetaminophen (NORCO) 5-325 MG tablet Take 1 tablet by mouth every 6 (six) hours as needed for moderate pain. 04/24/19   Dagoberto Ligas, PA-C  insulin regular (NOVOLIN R RELION) 100 units/mL injection Inject 0-0.09 mLs (0-9 Units total) into the skin 3 (three) times daily with meals. CBG < 70: implement hypoglycemia protocol CBG 70 - 120: 0 units CBG 121 - 150: 1 unit CBG 151 - 200: 2 units CBG 201 - 250: 3 units CBG 251 - 300: 5 units CBG 301 - 350: 7 units CBG 351 - 400: 9 units CBG > 400: call MD. Patient taking differently: Inject 0-14 Units into the skin 3 (three) times daily with meals. CBG < 70: implement hypoglycemia protocol CBG 70 - 120: 0 units CBG 121 - 150: 4 unit CBG 151 - 200: 6 units CBG 201 - 250: 8 units CBG 251 -  300: 10 units CBG 301 - 350: 12 units CBG 351 - 400: 14 units CBG > 400: call MD. 02/13/18   [name], MD  metoprolol tartrate (LOPRESSOR) 25 MG tablet Take 0.5 tablets (12.5 mg total) by mouth 2 (two) times daily. 05/04/19 06/03/19  Barb Merino, MD  ondansetron (ZOFRAN) 4 MG tablet Take 1 tablet (4 mg total) by mouth every 6 (six) hours. Patient taking differently: Take 4 mg by mouth every 8 (eight) hours as needed for nausea.  12/22/18   Orpah Greek, MD  pantoprazole (PROTONIX) 40 MG tablet Take 40 mg by mouth 2 (two) times daily.     [provider]  PARoxetine (PAXIL) 10 MG tablet Take 10 mg by mouth daily.    [provider]   Current Facility-Administered Medications  Medication Dose Route Frequency Provider Last Rate Last Dose  . 0.9 %  sodium chloride infusion  250 mL Intravenous PRN Opyd, Ilene Qua, MD      . acetaminophen (TYLENOL) tablet 650 mg   650 mg Oral Q4H PRN Opyd, Ilene Qua, MD      . aspirin chewable tablet 324 mg  324 mg Oral Once Opyd, Ilene Qua, MD      . aspirin EC tablet 81 mg  81 mg Oral Daily Opyd, Ilene Qua, MD   81 mg at 06/10/19 0917  . atorvastatin (LIPITOR) tablet 40 mg  40 mg Oral q1800 Opyd, Ilene Qua, MD      . calcium acetate (PHOSLO) capsule 667 mg  667 mg Oral TID WC Opyd, Ilene Qua, MD   667 mg at 06/10/19 1235  . Chlorhexidine Gluconate Cloth 2 % PADS 6 each  6 each Topical Q0600 Opyd, Ilene Qua, MD   6 each at 06/10/19 0529  . clopidogrel (PLAVIX) tablet 75 mg  75 mg Oral Daily Opyd, Ilene Qua, MD   75 mg at 06/10/19 0916  . heparin injection 5,000 Units  5,000 Units Subcutaneous Q8H Opyd, Ilene Qua, MD   5,000 Units at 06/10/19 0645  . insulin aspart (novoLOG) injection 0-9 Units  0-9 Units Subcutaneous Q4H Opyd, Ilene Qua, MD   2 Units at 06/10/19 1235  . metoprolol tartrate (LOPRESSOR) tablet 12.5 mg  12.5 mg Oral BID Opyd, Ilene Qua, MD   12.5 mg at 06/10/19 0916  . nitroGLYCERIN (NITROSTAT) SL tablet 0.4 mg  0.4 mg Sublingual Q5 Min x 3 PRN Opyd, Ilene Qua, MD      . ondansetron (ZOFRAN) injection 4 mg  4 mg Intravenous Q6H PRN Opyd, Ilene Qua, MD      . pantoprazole (PROTONIX) EC tablet 40 mg  40 mg Oral BID Opyd, Ilene Qua, MD   40 mg at 06/10/19 0916  . PARoxetine (PAXIL) tablet 10 mg  10 mg Oral Daily Opyd, Ilene Qua, MD   10 mg at 06/10/19 0916  . sodium chloride flush (NS) 0.9 % injection 3 mL  3 mL Intravenous Q12H Opyd, Ilene Qua, MD   3 mL at 06/10/19 0917  . sodium chloride flush (NS) 0.9 % injection 3 mL  3 mL Intravenous PRN Vianne Bulls, MD       Labs: Basic Metabolic Panel: Recent Labs  Lab 06/09/19 0650 06/09/19 2130 06/10/19 0237  NA 141 138 140  K 3.7 3.4* 3.1*  CL 103 98 99  CO2 21* 26 28  GLUCOSE 200* 140* 94  BUN 65* 31* 33*  CREATININE 11.61* 7.04* 7.59*  CALCIUM 9.0 8.8* 9.0  Liver Function Tests: Recent Labs  Lab 06/09/19 2130  AST 44*  ALT 34  ALKPHOS 126   BILITOT 0.9  PROT 7.4  ALBUMIN 3.1*   CBC: Recent Labs  Lab 06/09/19 0650 06/10/19 0237  WBC 9.2 6.6  NEUTROABS 7.6  --   HGB 10.0* 9.7*  HCT 31.7* 30.1*  MCV 92.7 90.4  PLT 167 157   CBG: Recent Labs  Lab 06/09/19 2043 06/10/19 0042 06/10/19 0423 06/10/19 0759 06/10/19 1127  GLUCAP 118* 179* 73 129* 193*   Studies/Results: Dg Chest Portable 1 View  Result Date: 06/09/2019 CLINICAL DATA:  Shortness of breath. EXAM: PORTABLE CHEST 1 VIEW COMPARISON:  05/03/2019 FINDINGS: 0644 hours. The cardio pericardial silhouette is enlarged. Diffuse interstitial and central alveolar opacity again noted in both lungs. No substantial pleural effusion. Right IJ dialysis catheter is stable. The visualized bony structures of the thorax are intact. Insert wire IMPRESSION: Stable exam. Cardiomegaly with diffuse interstitial and central alveolar opacity bilaterally. Imaging features may reflect pulmonary edema although underlying chronic infectious/inflammatory etiology not excluded. Electronically Signed   By: Misty Stanley M.D.   On: 06/09/2019 07:45    Dialysis Orders: [place] TTS T: 4 hr, 180NRe, BFR 400/DFR auto 2.0. EDW 81kg. 2K, 3.5 Ca, TDC, linear sodium Heparin 6000 unit bolus Mircera 30 mcg IV q 4 weeks Hectorol 2 mcg IV q HD Renvela 800 mg 3 tabs TID Midodrine 10mg  before dialysis  Assessment/Plan: 1.  Shortness of breath/CP: CXR on 06/09/2019 consistent with pulmonary edema, in the setting of missed dialysis treatment. Clinically improved today and SOB resolved. Experienced "pinching" sensation during dialysis and thus admitted. Troponins trending down. On aspirin, statin and beta blocker. Per primary.   2.  ESRD:  Dialyzes TTS, missed HD prior to admission. Clinically improved after dialysis last night. K+ 3.1 this AM, however this was checked right after dialysis. Already given Kcl 40 mEq.  Plan to resume TTS dialysis schedule. 3.  Hypertension/volume:  Pulmonary edema on presentation, SOB resolved after urgent HD on 9/26. Takes midodrine before dialysis as an outpatient but BP remains moderately elevated here. Continue to titrate down EDW as tolerated.  4.  Anemia: Hemoglobin 9.7, on mircera as outpatient. Consider starting aranesp with HD if remains admitted, otherwise increase dose of mircera at discharge.  5.  Metabolic bone disease: Corr Calcium 9.7. Continue hectorol and renvela.  6.  Nutrition:  Renal diet with fluid restrictions.   Anice Paganini, PA-C 06/10/2019, 1:56 PM  [facility] Pager: [phone]  Pt seen, examined and agree w A/P as above. ESRD pt w/ SOB, chest pain and mild-mod pulm edema, was dialyzed last night but didn't look or feel very good after HD here and was sent back to ED where he was then admitted.  Feeling a little better today. Plan next HD Tuesday if still here.  Kelly Splinter  MD 06/10/2019, 3:05 PM

## 2019-06-10 NOTE — Evaluation (Signed)
Physical Therapy Evaluation Patient Details Name: Blake [name] MRN: [id number] DOB: Apr 20, 1946 Today's Date: 06/10/2019   History of Present Illness  73 y.o. male with medical history significant for coronary artery disease, end-stage renal disease on hemodialysis, and insulin-dependent diabetes mellitus, now presenting to the emergency department for evaluation of malaise.  Patient had been in the emergency department at 6:30 AM this morning with shortness of breath and acute hypoxic respiratory failure secondary to pulmonary edema after missing his last dialysis session.   Clinical Impression  Pt seen asap as imminent discharge.  Pt presents with moderate limitations to mobility due primarily to cardiopulmonary dysfunction in the form of shortness of breath, dizziness, intolerance to upright positions.  Per pt, he experiences dizziness like this but can usually get past it with several seated rest breaks.  Unable to remain standing with support and assist for more than 60 seconds this session, unable to tolerate further activity and returned to side/supine. Prior to: assessed orthostatics with these results: Orthostatic BPs   BP HR  Supine 115/52 77  Sitting 141/65 78  Standing 130/61 85  Sitting after stand 137/73 79    Awaiting imaging for suspected PE per nurse (not noted in chart as of this time/date).  Suspect function will be restored with resolution of medical issues.  Recommend nursing to assist with mobility needs and reconsult therapy if pt should decline or functional mobility does not improve as medical conditions are controlled.     Follow Up Recommendations Home health PT    Equipment Recommendations  Rolling walker with 5" wheels    Recommendations for Other Services       Precautions / Restrictions        Mobility  Bed Mobility Overal bed mobility: Modified Independent             General bed mobility comments: incre time to adjust covers and body  position  Transfers Overall transfer level: Needs assistance Equipment used: Rolling walker (2 wheeled) Transfers: Sit to/from Stand Sit to Stand: Min assist         General transfer comment: pt=90-95% with some increased effort due to lightheaded/dizzy symptoms  Ambulation/Gait             General Gait Details: deferred due to inability to sustain standing due to symptoms  Stairs            Wheelchair Mobility    Modified Rankin (Stroke Patients Only)       Balance Overall balance assessment: Mild deficits observed, not formally tested                                           Pertinent Vitals/Pain Pain Assessment: No/denies pain    Home Living Family/patient expects to be discharged to:: Private residence Living Arrangements: Other relatives Available Help at Discharge: Family;Friend(s);Available PRN/intermittently Type of Home: House Home Access: Stairs to enter Entrance Stairs-Rails: Psychiatric nurse of Steps: 3 Home Layout: One level Home Equipment: Cane - single point;Shower seat      Prior Function Level of Independence: Independent with assistive device(s)         Comments: ambulates with cane household distances. Drives himself to HD and grocery store. Uses wheelchair once inside HD clinic and motorized scooter in grocery store.     Hand Dominance   Dominant Hand: Right    Extremity/Trunk Assessment  Upper Extremity Assessment Upper Extremity Assessment: Overall WFL for tasks assessed    Lower Extremity Assessment Lower Extremity Assessment: Generalized weakness;Overall WFL for tasks assessed       Communication   Communication: No difficulties  Cognition Arousal/Alertness: Awake/alert Behavior During Therapy: WFL for tasks assessed/performed Overall Cognitive Status: Within Functional Limits for tasks assessed                                        General Comments  General comments (skin integrity, edema, etc.): pt exhibted s/s orthostasis, increasing intensity and limitations due to dizziness upon standing; Assessed as follows: Supine: 115/52, HR=77; Seated: 141/65, HR=78; Standing: 130/61, HR=85; Seated: 137/73, HR=79    Exercises     Assessment/Plan    PT Assessment Patient needs continued PT services  PT Problem List Decreased activity tolerance;Decreased mobility;Cardiopulmonary status limiting activity       PT Treatment Interventions DME instruction;Gait training;Stair training;Functional mobility training;Therapeutic activities;Therapeutic exercise;Balance training;Patient/family education    PT Goals (Current goals can be found in the Care Plan section)  Acute Rehab PT Goals Patient Stated Goal: feel better PT Goal Formulation: With patient Time For Goal Achievement: 06/24/19 Potential to Achieve Goals: Good    Frequency Min 3X/week   Barriers to discharge        Co-evaluation               AM-PAC PT "6 Clicks" Mobility  Outcome Measure Help needed turning from your back to your side while in a flat bed without using bedrails?: None Help needed moving from lying on your back to sitting on the side of a flat bed without using bedrails?: None Help needed moving to and from a bed to a chair (including a wheelchair)?: A Little Help needed standing up from a chair using your arms (e.g., wheelchair or bedside chair)?: A Little Help needed to walk in hospital room?: A Lot Help needed climbing 3-5 steps with a railing? : Total 6 Click Score: 17    End of Session   Activity Tolerance: Treatment limited secondary to medical complications (Comment)(BP/HR changes with postion) Patient left: in bed;with call bell/phone within reach Nurse Communication: Mobility status PT Visit Diagnosis: Unsteadiness on feet (R26.81);Muscle weakness (generalized) (M62.81)    Time: [id number] PT Time Calculation (min) (ACUTE ONLY): 32  min   Charges:   PT Evaluation $PT Eval Moderate Complexity: 1 Mod PT Treatments $Therapeutic Activity: 8-22 mins        Kearney Hard, PT, DPT, MS Board Certified Geriatric Clinical Specialist  Herbie Drape 06/10/2019, 4:42 PM

## 2019-06-11 LAB — GLUCOSE, CAPILLARY
Glucose-Capillary: 110 mg/dL — ABNORMAL HIGH (ref 70–99)
Glucose-Capillary: 112 mg/dL — ABNORMAL HIGH (ref 70–99)
Glucose-Capillary: 112 mg/dL — ABNORMAL HIGH (ref 70–99)
Glucose-Capillary: 142 mg/dL — ABNORMAL HIGH (ref 70–99)
Glucose-Capillary: 143 mg/dL — ABNORMAL HIGH (ref 70–99)
Glucose-Capillary: 94 mg/dL (ref 70–99)
Glucose-Capillary: 97 mg/dL (ref 70–99)

## 2019-06-11 MED ORDER — NEPRO/CARBSTEADY PO LIQD
237.0000 mL | Freq: Two times a day (BID) | ORAL | Status: DC
  Administered 2019-06-11 – 2019-06-15 (×5): 237 mL via ORAL

## 2019-06-11 NOTE — Progress Notes (Signed)
Physical Therapy Treatment Patient Details Name: Blake [name] MRN: [id number] DOB: March 31, 1946 Today's Date: 06/11/2019    History of Present Illness 73 year old gentleman was admitted from home with missed HD session was found to have chest pain and elevated troponin, hypoxia.  Resolved pulm edema and hypoxia, with HD session.  Recently no chest pain despite several incidents.  PMHx:  ESRD, DM, HTN, HLD, osteomyelitis, R toe amp, NSTEMI, stent placement, heart cath Aug 2020    PT Comments    Pt was seen for mobility and assessment of tolerance of standing to try to walk, but due to dizziness that was not attributed to BP or sats he could not walk.  While pt appears he can move with assist, he is not going to be independent with gait and transfers as prior to this admission.  Talked with him about a rehab admission to SNF and he is expecting to go directly home.  Talked with MD and case manager about his plan and will work toward safe home mobility given that he is expected to refuse the care at SNF level as well as having refused HHPT.  See acutely for monitoring of symptoms and the impact on gait and transfers.   Follow Up Recommendations  SNF     Equipment Recommendations  None recommended by PT    Recommendations for Other Services       Precautions / Restrictions Precautions Precautions: Fall Restrictions Weight Bearing Restrictions: No    Mobility  Bed Mobility               General bed mobility comments: in chair when PT arrived  Transfers Overall transfer level: Needs assistance Equipment used: Rolling walker (2 wheeled) Transfers: Sit to/from Stand Sit to Stand: Min assist         General transfer comment: O2 sats were 91% at the lowest with standing effort, up and then asked to sit due to dizziness  Ambulation/Gait             General Gait Details: deferred due to symptoms   Stairs             Wheelchair Mobility    Modified  Rankin (Stroke Patients Only)       Balance Overall balance assessment: Needs assistance Sitting-balance support: Feet supported Sitting balance-Leahy Scale: Good     Standing balance support: Bilateral upper extremity supported;During functional activity Standing balance-Leahy Scale: Fair Standing balance comment: less than fair without walker                            Cognition Arousal/Alertness: Awake/alert Behavior During Therapy: WFL for tasks assessed/performed Overall Cognitive Status: Within Functional Limits for tasks assessed                                        Exercises      General Comments General comments (skin integrity, edema, etc.): HR and BP did not support orthostasis yesterday      Pertinent Vitals/Pain Pain Assessment: No/denies pain    Home Living                      Prior Function            PT Goals (current goals can now be found in the care plan section) Acute Rehab PT Goals Patient  Stated Goal: feel better Progress towards PT goals: Not progressing toward goals - comment    Frequency    Min 2X/week      PT Plan Discharge plan needs to be updated    Co-evaluation              AM-PAC PT "6 Clicks" Mobility   Outcome Measure  Help needed turning from your back to your side while in a flat bed without using bedrails?: None Help needed moving from lying on your back to sitting on the side of a flat bed without using bedrails?: None Help needed moving to and from a bed to a chair (including a wheelchair)?: A Little Help needed standing up from a chair using your arms (e.g., wheelchair or bedside chair)?: A Little Help needed to walk in hospital room?: A Lot Help needed climbing 3-5 steps with a railing? : Total 6 Click Score: 17    End of Session Equipment Utilized During Treatment: Gait belt Activity Tolerance: Treatment limited secondary to medical complications (Comment) Patient  left: in chair;with call bell/phone within reach;with chair alarm set Nurse Communication: Mobility status PT Visit Diagnosis: Unsteadiness on feet (R26.81);Muscle weakness (generalized) (M62.81);Difficulty in walking, not elsewhere classified (R26.2);Dizziness and giddiness (R42)     Time: [id number] PT Time Calculation (min) (ACUTE ONLY): 16 min  Charges:  $Therapeutic Activity: 8-22 mins                    Ramond Dial 06/11/2019, 1:06 PM   Mee Hives, PT MS Acute Rehab Dept. Number: [place]

## 2019-06-11 NOTE — TOC Initial Note (Signed)
Transition of Care [facility]) - Initial/Assessment Note    Patient Details  Name: Blake [name] MRN: [id number] Date of Birth: 1945-09-22  Transition of Care [facility]) CM/SW Contact:    Bethena Roys, RN Phone Number: 06/11/2019, 12:08 PM  Clinical Narrative: Pt presented for Chest Pain- HD TTS Schedule. PTA from home with support of niece. Plan to return home. CM did discuss PT recommendations for [facility] PT services. Patient declined [facility] at this time. Patient has DME RW, Cane and WC. No DME needs at this time. CM did make patient aware that once he gets home and if he needs services in the future to contact his PCP for any assistance. No further needs from CM at this time.               Expected Discharge Plan: Home/Self Care Barriers to Discharge: No Barriers Identified   Patient Goals and CMS Choice Patient states their goals for this hospitalization and ongoing recovery are:: "to return home"   Choice offered to / list presented to : NA  Expected Discharge Plan and Services Expected Discharge Plan: Home/Self Care In-house Referral: NA Discharge Planning Services: CM Consult Post Acute Care Choice: NA Living arrangements for the past 2 months: Single Family Home                 HH Arranged: Refused HH(Case Manager did state to call PCP if needs assistance in the future.)   Prior Living Arrangements/Services Living arrangements for the past 2 months: Single Family Home Lives with:: Relatives Patient language and need for interpreter reviewed:: Yes Do you feel safe going back to the place where you live?: Yes      Need for Family Participation in Patient Care: Yes (Comment) Care giver support system in place?: Yes (comment)   Criminal Activity/Legal Involvement Pertinent to Current Situation/Hospitalization: No - Comment as needed  Activities of Daily Living Home Assistive Devices/Equipment: CBG Meter, Scales, Cane (specify quad or straight), Wheelchair,  Environmental consultant (specify type) ADL Screening (condition at time of admission) Patient's cognitive ability adequate to safely complete daily activities?: Yes Is the patient deaf or have difficulty hearing?: No Does the patient have difficulty seeing, even when wearing glasses/contacts?: No Does the patient have difficulty concentrating, remembering, or making decisions?: No Patient able to express need for assistance with ADLs?: Yes Does the patient have difficulty dressing or bathing?: No Independently performs ADLs?: Yes (appropriate for developmental age) Does the patient have difficulty walking or climbing stairs?: No Weakness of Legs: None Weakness of Arms/Hands: None  Permission Sought/Granted Permission sought to share information with : Family Supports Permission granted to share information with : No              Emotional Assessment Appearance:: Appears stated age Attitude/Demeanor/Rapport: Engaged   Orientation: : Oriented to Situation, Oriented to  Time, Oriented to Place, Oriented to Self Alcohol / Substance Use: Not Applicable Psych Involvement: No (comment)  Admission diagnosis:  Chest pain, unspecified type [R07.9] Patient Active Problem List   Diagnosis Date Noted  . Dyspnea 06/10/2019  . Pulmonary edema 06/09/2019  . Chest pain 06/09/2019  . Pressure injury of skin 05/04/2019  . NSTEMI (non-ST elevated myocardial infarction) ([place]) 04/30/2019  . Acute encephalopathy 04/29/2019  . Fluid overload 12/08/2018  . Chronic respiratory failure with hypoxia ([place]) 02/24/2018  . Dyslipidemia associated with type 2 diabetes mellitus ([place]) 02/14/2018  . 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 GERD without esophagitis 02/14/2018  . Hypomagnesemia 02/08/2018  . Thrombocytopenia ([place]) 02/07/2018  . Sepsis ([place]) 02/07/2018  . Hypocalcemia 02/07/2018  . Hypokalemia 02/07/2018  . Nausea and vomiting 04/21/2016  .  Hyperkalemia 04/21/2016  . Encephalopathy, metabolic [id number]  . Acute pulmonary edema ([name])   . 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 Depression 07/07/2014  . Neuropathic pain, leg, bilateral 07/07/2014  . Diabetic retinopathy ([place]) 07/07/2014  . Acute respiratory failure ([place]) 06/28/2014  . ESRD (end stage renal disease) ([place]) 06/27/2014  . 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 ([place]) 10/17/2012  . Hypoxia 10/17/2012  . Hypertension    PCP:  [name], MD Pharmacy:   [facility], [place] - [address] [place], [place] [address] Phone: [phone] Fax: [phone]  [facility] [place], [place] - [address] [place] [address] [place] [address] Phone: [phone] Fax: [phone]     Social Determinants of Health (SDOH) Interventions    Readmission Risk Interventions Readmission Risk Prevention Plan 06/11/2019  Transportation Screening Complete  HRI or Home Care Consult Complete  Social Work Consult for [place] Planning/Counseling Complete  Palliative Care Screening Not Applicable  Medication Review Press photographer) Complete  Some recent data might be hidden

## 2019-06-11 NOTE — Progress Notes (Signed)
[facility] Progress Note   Subjective:  Seen in room. Drowsy, but no complaints. Denies CP/SOB.    Objective Vitals:   06/10/19 2015 06/10/19 2116 06/11/19 0427 06/11/19 0949  BP: (!) 117/56 (!) 117/56 (!) 134/55 (!) 143/52  Pulse: 81 88 68 60  Resp: 16  19   Temp: 97.9 F (36.6 C)  97.9 F (36.6 C)   TempSrc: Oral  Oral   SpO2: 97%  98%   Weight:   78.1 kg   Height:        Physical Exam General: WNWD male, fatigued  Heart: RRR Lungs: CTAB  Abdomen: soft NTND Extremities: No LE edema  Dialysis Access: R IJ TDC dsg clean    Weight change: -5.336 kg   Additional Objective Labs: Basic Metabolic Panel: Recent Labs  Lab 06/09/19 0650 06/09/19 2130 06/10/19 0237  NA 141 138 140  K 3.7 3.4* 3.1*  CL 103 98 99  CO2 21* 26 28  GLUCOSE 200* 140* 94  BUN 65* 31* 33*  CREATININE 11.61* 7.04* 7.59*  CALCIUM 9.0 8.8* 9.0   CBC: Recent Labs  Lab 06/09/19 0650 06/10/19 0237  WBC 9.2 6.6  NEUTROABS 7.6  --   HGB 10.0* 9.7*  HCT 31.7* 30.1*  MCV 92.7 90.4  PLT 167 157   Blood Culture    Component Value Date/Time   SDES BLOOD LEFT HAND 04/30/2019 2058   SPECREQUEST  04/30/2019 2058    BOTTLES DRAWN AEROBIC ONLY Blood Culture results may not be optimal due to an inadequate volume of blood received in culture bottles   CULT  04/30/2019 2058    NO GROWTH 5 DAYS Performed at [facility], [address] [place], [place] [address]    REPTSTATUS 05/05/2019 FINAL 04/30/2019 2058     Medications: . sodium chloride     . aspirin  324 mg Oral Once  . aspirin EC  81 mg Oral Daily  . atorvastatin  40 mg Oral q1800  . Chlorhexidine Gluconate Cloth  6 each Topical Q0600  . clopidogrel  75 mg Oral Daily  . [START ON 06/12/2019] doxercalciferol  2 mcg Intravenous Q T,Th,Sa-HD  . heparin  5,000 Units Subcutaneous Q8H  . insulin aspart  0-9 Units Subcutaneous Q4H  . metoprolol tartrate  12.5 mg Oral BID  . pantoprazole  40 mg Oral BID  .  PARoxetine  10 mg Oral Daily  . sevelamer carbonate  2,400 mg Oral TID WC  . sodium chloride flush  3 mL Intravenous Q12H   Dialysis Orders:  NW TTS 4H F180 BFR 400 EDW 81kg. 2K/3.5Ca  Na linear TDC Heparin 6000  Mircera 30 Hectorol 2  Renvela 800 mg 3 tabs TID Midodrine 10mg  before dialysis  Assessment/Plan: 1.  Shortness of breath/CP: CXR on 06/09/2019 consistent with pulmonary edema, in the setting of missed dialysis treatment. No PE on chest CT. Clinically improved after dialysis and SOB resolved.  2.  ESRD:  HD TTS. Next HD 9/29. Use added K+ bath  3.  Hypertension/volume: Pulmonary edema on presentation, SOB resolved after urgent HD on 9/26. Takes midodrine before dialysis as an outpatient but BP remains moderately elevated here. Continue to titrate down EDW as tolerated.  4.  Anemia: Hemoglobin 9.7, on mircera as outpatient. Consider starting aranesp with HD if remains admitted, otherwise increase dose of mircera at discharge.  5.  Metabolic bone disease: Corr Calcium 9.7. Continue hectorol and renvela.  6. CAD s/p DES x 2 -  Continues aspirin, Plavix, metoprolol, statin.  7. [place] - PT recommending SNF. Patient declining SNF/HH.   Lynnda Child PA-C [facility] Pager [phone] 06/11/2019,1:08 PM  LOS: 1 day

## 2019-06-11 NOTE — Progress Notes (Signed)
PROGRESS NOTE    Blake [name]  [id number] DOB: [birth year] DOA: 06/09/2019 PCP: Blake Seashore, MD      Brief Narrative:  Blake [name] is a 73 y.o. 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  In the ER, the patient had elevated BNP from baseline, appeared weak and dyspneic.  Was dialyzed from the ER with plans for d/c home but was persistently dyspneic and nearly syncopized and reported chest pain.       Assessment & Plan:   Fatigue and dizziness and anorexia leading to weight loss and failure to thrive  This is the patient's primary symptomatology: progressive anorexia and weight loss over last months leading to fatigue, dizziness with standing, worsening dyspnea on exertion over the last 3-4 days.  He has chronic dyspnea and fatigue (at baseline, able to walk ~30 feet only, then has to sit and rest), but in the last few days, he wasn't eating well due to nausea, then became so weak he could barely get up.    Orthostatic BPs normal.  Not anemic relative to baseline.  EF normal on echo 1 month ago and does not appear fluid overloaded and BNP only slightly up. CTA chest shows no PE or infection, maybe a little chronic edema and emphysema.  Not hypoxic with exertion.  ECG personally repeated and reviewed, no ischemic changes.  No fever or clinical or laboratory findings to point to infection.  TSH normal on recent admission.  Differential for anorexia, unintentional weight loss includes adrenal insufficiency, cancer, HIV, liver disease, renal failure.  -Consult GI -Need to repeat CT abdomen with contrast to evaluate esophageal thickening from last April -Check a.m. cortisol -Check B12, CK -Check CMP and CBC -Check prealbumin -Check HIV  ESRD -Continue phoslo -Consult Nephrology for routine HD   Chest pain, noncardiac This was  the initial stated reason for admission.  It was however, transient, non-exertional, clinically not suspicious for ACS.  Has been adherent to Plavix.  Troponin low and flat.  Telemetry shows high PVC burden only. No further chest pain.  Diabetes Glucoses normal -Continue SSI corrections  Coronary disease secondary prevention S/p DES x2 in Aug 2020 Hypertension BP controlled.  -Continue aspirin, Plavix -Continue atorvastatin -Continue metoprolol  Unstageable pressure injury left lateral ankle, POA  Other meds -Continue SSRI -Continue PPI  Anemia of CKD No active bleeding, Hgb stable relative to baseline.      MDM and disposition: The below labs and imaging reports reviewed and summarized above.  Medication management as above.  The patient was admitted with fatigue, weight loss, malaise and chest pain.  His chest pain has been worked up, and does not appear to be ischemic.  However he remains significantly debilitated, and although he lives independently at baseline, he is currently unable to transfer without assistance, walk more than a few feet without collapsing, and is barely able to hold himself up in a chair.    Will broaden owrk up for weight loss, anorexia.  Consult GI, obtain CT.      DVT prophylaxis: Heparin Code Status: FULL Family Communication:     Procedures:   CTA chest 9/27 --- no PE    Subjective: Very tired.  No vomiting, but minimal p.o. intake.  No cough, sputum, fever, abdominal pain, diarrhea.     Objective: Vitals:   06/10/19 2116  06/11/19 0427 06/11/19 0949 06/11/19 1454  BP: (!) 117/56 (!) 134/55 (!) 143/52 (!) 149/66  Pulse: 88 68 60 (!) 59  Resp:  19    Temp:  97.9 F (36.6 C)  97.8 F (36.6 C)  TempSrc:  Oral  Oral  SpO2:  98%  100%  Weight:  78.1 kg    Height:        Intake/Output Summary (Last 24 hours) at 06/11/2019 1810 Last data filed at 06/11/2019 1600 Gross per 24 hour  Intake 1000 ml  Output --  Net 1000 ml    Filed Weights   06/09/19 1340 06/10/19 0410 06/11/19 0427  Weight: 83.4 kg 77.2 kg 78.1 kg    Examination: General appearance: Adult male, lying sprawled on his recliner, barely able to sit up straight.  Listless. HEENT: Anicteric, conjunctival pink, lids lashes normal.  No nasal deformity, discharge, or epistaxis.  Lips moist, oropharynx tacky dry, no oral lesions, hearing normal.     Skin: Skin warm and dry, wan appearance, no jaundice. Cardiac: RRR, no murmurs, JVP normal, no lower extremity edema. Respiratory: Respiratory effort appears dyspneic with exertion, normal at rest, no rales or wheezes, good air entry in all posterior lung fields. Abdomen: Abdomen soft without tenderness to palpation or guarding, no ascites or distention..   MSK: No deformities or effusions. Neuro: Awake and alert, extraocular movements intact, moves all extremities with normal strength and coordination, speech fluent.    Psych: Sensorium intact responding to questions, attention normal, affect normal, judgment and insight appear normal.    Data Reviewed: I have personally reviewed following labs and imaging studies:  CBC: Recent Labs  Lab 06/09/19 0650 06/10/19 0237  WBC 9.2 6.6  NEUTROABS 7.6  --   HGB 10.0* 9.7*  HCT 31.7* 30.1*  MCV 92.7 90.4  PLT 167 [id number]   Basic Metabolic Panel: Recent Labs  Lab 06/09/19 0650 06/09/19 2130 06/10/19 0237  NA 141 138 140  K 3.7 3.4* 3.1*  CL 103 98 99  CO2 21* 26 28  GLUCOSE 200* 140* 94  BUN 65* 31* 33*  CREATININE 11.61* 7.04* 7.59*  CALCIUM 9.0 8.8* 9.0   GFR: Estimated Creatinine Clearance: 8.6 mL/min (A) (by C-G formula based on SCr of 7.59 mg/dL (H)). Liver Function Tests: Recent Labs  Lab 06/09/19 2130  AST 44*  ALT 34  ALKPHOS 126  BILITOT 0.9  PROT 7.4  ALBUMIN 3.1*   No results for input(s): LIPASE, AMYLASE in the last 168 hours. No results for input(s): AMMONIA in the last 168 hours. Coagulation Profile: No results for  input(s): INR, PROTIME in the last 168 hours. Cardiac Enzymes: No results for input(s): CKTOTAL, CKMB, CKMBINDEX, TROPONINI in the last 168 hours. BNP (last 3 results) No results for input(s): PROBNP in the last 8760 hours. HbA1C: Recent Labs    06/10/19 0237  HGBA1C 6.2*   CBG: Recent Labs  Lab 06/11/19 0002 06/11/19 0416 06/11/19 0732 06/11/19 1124 06/11/19 1607  GLUCAP 94 112* 110* 112* 142*   Lipid Profile: No results for input(s): CHOL, HDL, LDLCALC, TRIG, CHOLHDL, LDLDIRECT in the last 72 hours. Thyroid Function Tests: No results for input(s): TSH, T4TOTAL, FREET4, T3FREE, THYROIDAB in the last 72 hours. Anemia Panel: No results for input(s): VITAMINB12, FOLATE, FERRITIN, TIBC, IRON, RETICCTPCT in the last 72 hours. Urine analysis:    Component Value Date/Time   COLORURINE YELLOW 08/01/2015 [place] 08/01/2015 0642   LABSPEC 1.013 08/01/2015 0642   PHURINE 8.0  08/01/2015 0642   GLUCOSEU 100 (A) 08/01/2015 0642   HGBUR MODERATE (A) 08/01/2015 0642   BILIRUBINUR NEGATIVE 08/01/2015 [place] 08/01/2015 0642   PROTEINUR 100 (A) 08/01/2015 0642   UROBILINOGEN 0.2 06/28/2014 1417   NITRITE NEGATIVE 08/01/2015 0642   LEUKOCYTESUR NEGATIVE 08/01/2015 0642   Sepsis Labs: @LABRCNTIP (procalcitonin:4,lacticacidven:4)  ) Recent Results (from the past 240 hour(s))  SARS CORONAVIRUS 2 (TAT 6-24 HRS) Nasopharyngeal Nasopharyngeal Swab     Status: None   Collection Time: 06/09/19  8:24 AM   Specimen: Nasopharyngeal Swab  Result Value Ref Range Status   SARS Coronavirus 2 NEGATIVE NEGATIVE Final    Comment: (NOTE) SARS-CoV-2 target nucleic acids are NOT DETECTED. The SARS-CoV-2 RNA is generally detectable in upper and lower respiratory specimens during the acute phase of infection. Negative results do not preclude SARS-CoV-2 infection, do not rule out co-infections with other pathogens, and should not be used as the sole basis for  treatment or other patient management decisions. Negative results must be combined with clinical observations, patient history, and epidemiological information. The expected result is Negative. Fact Sheet for Patients: SugarRoll.be Fact Sheet for Healthcare Providers: https://www.woods-mathews.com/ This test is not yet approved or cleared by the [place] FDA and  has been authorized for detection and/or diagnosis of SARS-CoV-2 by FDA under an Emergency Use Authorization (EUA). This EUA will remain  in effect (meaning this test can be used) for the duration of the COVID-19 declaration under Section 56 4(b)(1) of the Act, 21 U.S.C. section 360bbb-3(b)(1), unless the authorization is terminated or revoked sooner. Performed at [facility], [place] [address], [place], [place] [address]          Radiology Studies: Ct Angio Chest Pe W Or Wo Contrast  Result Date: 06/10/2019 CLINICAL DATA:  Shortness of breath and edema. History of end-stage renal disease. EXAM: CT ANGIOGRAPHY CHEST WITH CONTRAST TECHNIQUE: Multidetector CT imaging of the chest was performed using the standard protocol during bolus administration of intravenous contrast. Multiplanar CT image reconstructions and MIPs were obtained to evaluate the vascular anatomy. CONTRAST:  45mL OMNIPAQUE IOHEXOL 350 MG/ML SOLN COMPARISON:  04/29/2019 FINDINGS: Cardiovascular: The pulmonary arteries are adequately opacified. There is no evidence of pulmonary embolism. The heart size is stable and at the upper limits of normal. Coronary stent present. No pericardial fluid identified. The thoracic aorta is normal in caliber. Mediastinum/Nodes: Stable mildly enlarged mediastinal lymph nodes and enlarged right hilar lymph nodes. Lungs/Pleura: Stable chronic lung disease with more prominent disease in the right lung compared to the left. It would be difficult to exclude subtle areas infection but  overall appearance of both lungs appears very similar since the prior CTA. Probable component of mild chronic pulmonary interstitial edema. No pleural effusions. No pneumothorax. No pulmonary masses. Upper Abdomen: No acute abnormality. Musculoskeletal: No chest wall abnormality. No acute or significant osseous findings. Review of the MIP images confirms the above findings. IMPRESSION: 1. No evidence of pulmonary embolism. 2. Stable mildly enlarged mediastinal lymph nodes and enlarged right hilar lymph nodes. 3. Stable chronic lung disease, right greater than left. There may be a component of mild chronic pulmonary interstitial edema. Electronically Signed   By: Aletta Edouard M.D.   On: 06/10/2019 16:05        Scheduled Meds:  aspirin  324 mg Oral Once   aspirin EC  81 mg Oral Daily   atorvastatin  40 mg Oral q1800   Chlorhexidine Gluconate Cloth  6 each  Topical Q0600   clopidogrel  75 mg Oral Daily   [START ON 06/12/2019] doxercalciferol  2 mcg Intravenous Q T,Th,Sa-HD   feeding supplement (NEPRO CARB STEADY)  237 mL Oral BID BM   heparin  5,000 Units Subcutaneous Q8H   insulin aspart  0-9 Units Subcutaneous Q4H   metoprolol tartrate  12.5 mg Oral BID   pantoprazole  40 mg Oral BID   PARoxetine  10 mg Oral Daily   sevelamer carbonate  2,400 mg Oral TID WC   sodium chloride flush  3 mL Intravenous Q12H   Continuous Infusions:  sodium chloride       LOS: 1 day    Time spent: 25 minutes    Edwin Dada, MD [facility] 06/11/2019, 6:10 PM     Please page through AMION:  [email or web address] Password [email or web address] If 7PM-7AM, please contact night-coverage

## 2019-06-11 NOTE — TOC Progression Note (Signed)
Transition of Care [facility]) - Progression Note    Patient Details  Name: Blake [name] MRN: [id number] Date of Birth: [birth year]  [facility]) CM/SW Contact  Graves-Bigelow, Ocie Cornfield, RN Phone Number: 06/11/2019, 3:24 PM  Clinical Narrative:  CM had previously spoken with patient and he declined [place] stating that his niece was at home with him. CM did speak with patient again since PT recommendations for SNF- Patient states he has been to SNF before and is willing to return. Patient asked me to reach out to niece to see which facility he was in last. Patient was previously at [facility] and [facility] will fax the patient out to facility. CM will follow for additional transition of care needs.    Expected Discharge Plan: Home/Self Care Barriers to Discharge: No Barriers Identified  Expected Discharge Plan and Services Expected Discharge Plan: Home/Self Care In-house Referral: NA Discharge Planning Services: CM Consult Post Acute Care Choice: NA Living arrangements for the past 2 months: Single Family Home HH Arranged: Refused HH(Case Manager did state to call PCP if needs assistance in the future.)    Social Determinants of Health (SDOH) Interventions    Readmission Risk Interventions Readmission Risk Prevention Plan 06/11/2019  Transportation Screening Complete  HRI or [place] Complete  Social Work Consult for [place] Planning/Counseling Complete  Palliative Care Screening Not Applicable  Medication Review Press photographer) Complete  Some recent data might be hidden

## 2019-06-11 NOTE — NC FL2 (Signed)
[place] LEVEL OF CARE SCREENING TOOL     IDENTIFICATION  Patient Name: Blake [name] Birthdate: 11-Jul-1946 Sex: male Admission Date (Current Location): 06/09/2019  [facility] and [place] Number:  Herbalist and Address:         Provider Number: [id number]  Attending Physician Name and Address:  Edwin Dada, *  Relative Name and Phone Number:  Kniss,Anita-Niece @ [phone]    Current Level of Care: SNF Recommended Level of Care: [place] Prior Approval Number:    Date Approved/Denied:   PASRR Number: [id number]  Discharge Plan: SNF    Current Diagnoses: Patient Active Problem List   Diagnosis Date Noted  . Dyspnea 06/10/2019  . Pulmonary edema 06/09/2019  . Chest pain 06/09/2019  . Pressure injury of skin 05/04/2019  . NSTEMI (non-ST elevated myocardial infarction) ([place]) 04/30/2019  . Acute encephalopathy 04/29/2019  . Fluid overload 12/08/2018  . Chronic respiratory failure with hypoxia ([place]) 02/24/2018  . Dyslipidemia associated with type 2 diabetes mellitus ([place]) 02/14/2018  . 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 GERD without esophagitis 02/14/2018  . Hypomagnesemia 02/08/2018  . Thrombocytopenia ([place]) 02/07/2018  . Sepsis ([place]) 02/07/2018  . Hypocalcemia 02/07/2018  . Hypokalemia 02/07/2018  . Nausea and vomiting 04/21/2016  . Hyperkalemia 04/21/2016  . Encephalopathy, metabolic [id number]  . Acute pulmonary edema ([name])   . 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 Depression 07/07/2014  . Neuropathic pain, leg, bilateral 07/07/2014  . Diabetic retinopathy ([place]) 07/07/2014  . Acute respiratory failure ([place]) 06/28/2014  . ESRD (end stage renal disease) ([place]) 06/27/2014  . 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 ([place]) 10/17/2012  . Hypoxia 10/17/2012  . Hypertension     Orientation  RESPIRATION BLADDER Height & Weight     Self, Time, Situation, Place  Normal Continent(Renal) Weight: 78.1 kg Height:  5\' 8"  (172.7 cm)  BEHAVIORAL SYMPTOMS/MOOD NEUROLOGICAL BOWEL NUTRITION STATUS      Continent(Renal) Diet(Renal/Carb modified with fluid restriction 1200 CC FR.)  AMBULATORY STATUS COMMUNICATION OF NEEDS Skin   Limited Assist Verbally Normal                       Personal Care Assistance Level of Assistance  Bathing, Feeding, Dressing Bathing Assistance: Limited assistance Feeding assistance: Independent Dressing Assistance: Limited assistance     Functional Limitations Info  Sight, Hearing, Speech Sight Info: Adequate Hearing Info: Adequate Speech Info: Adequate    SPECIAL CARE FACTORS FREQUENCY  PT (By licensed PT), OT (By licensed OT)     PT Frequency: 5 x week OT Frequency: 5 x week            Contractures Contractures Info: Not present    Additional Factors Info  Code Status, Allergies, Psychotropic, Insulin Sliding Scale Code Status Info: Full Allergies Info: No Known Allergies Psychotropic Info: Paxil 10 mg po daily         Current Medications (06/11/2019):  This is the current hospital active medication list Current Facility-Administered Medications  Medication Dose Route Frequency Provider Last Rate Last Dose  . 0.9 %  sodium chloride infusion  250 mL Intravenous PRN Opyd, Ilene Qua, MD      . acetaminophen (TYLENOL) tablet 650 mg  650 mg Oral Q4H PRN Opyd, Ilene Qua, MD      . aspirin chewable tablet 324 mg  324 mg Oral Once Opyd, Ilene Qua, MD      . aspirin EC tablet 81 mg  81 mg Oral Daily Opyd, Ilene Qua, MD   81 mg at 06/11/19 0949  . atorvastatin (LIPITOR) tablet 40 mg  40 mg Oral q1800 Opyd, Ilene Qua, MD   40 mg at 06/10/19 1756  . Chlorhexidine Gluconate Cloth 2 % PADS 6 each  6 each Topical Q0600 Opyd, Ilene Qua, MD   6 each at 06/11/19 0604  . clopidogrel (PLAVIX) tablet 75 mg  75 mg Oral Daily Opyd, Ilene Qua, MD   75  mg at 06/11/19 0949  . [START ON 06/12/2019] doxercalciferol (HECTOROL) injection 2 mcg  2 mcg Intravenous Q T,Th,Sa-HD Collins, Samantha G, PA-C      . feeding supplement (NEPRO CARB STEADY) liquid 237 mL  237 mL Oral BID BM Danford, Suann Larry, MD   237 mL at 06/11/19 1510  . heparin injection 5,000 Units  5,000 Units Subcutaneous Q8H Opyd, Ilene Qua, MD   5,000 Units at 06/11/19 1458  . insulin aspart (novoLOG) injection 0-9 Units  0-9 Units Subcutaneous Q4H Opyd, Ilene Qua, MD   3 Units at 06/10/19 2116  . metoprolol tartrate (LOPRESSOR) tablet 12.5 mg  12.5 mg Oral BID Opyd, Ilene Qua, MD   12.5 mg at 06/11/19 0957  . nitroGLYCERIN (NITROSTAT) SL tablet 0.4 mg  0.4 mg Sublingual Q5 Min x 3 PRN Opyd, Ilene Qua, MD      . ondansetron (ZOFRAN) injection 4 mg  4 mg Intravenous Q6H PRN Opyd, Ilene Qua, MD      . pantoprazole (PROTONIX) EC tablet 40 mg  40 mg Oral BID Opyd, Ilene Qua, MD   40 mg at 06/11/19 0954  . PARoxetine (PAXIL) tablet 10 mg  10 mg Oral Daily Opyd, Ilene Qua, MD   10 mg at 06/11/19 0955  . sevelamer carbonate (RENVELA) tablet 2,400 mg  2,400 mg Oral TID WC Collins, Samantha G, PA-C   2,400 mg at 06/11/19 1149  . sodium chloride flush (NS) 0.9 % injection 3 mL  3 mL Intravenous Q12H Opyd, Ilene Qua, MD   3 mL at 06/11/19 1000  . sodium chloride flush (NS) 0.9 % injection 3 mL  3 mL Intravenous PRN Opyd, Ilene Qua, MD         Discharge Medications: Please see discharge summary for a list of discharge medications.  Relevant Imaging Results:  Relevant Lab Results:   Additional Information SS#: [id number]; HD at [facility] TTHS  Graves-Bigelow, Ocie Cornfield, [place]

## 2019-06-12 ENCOUNTER — Inpatient Hospital Stay (HOSPITAL_COMMUNITY): Payer: Medicare Other

## 2019-06-12 DIAGNOSIS — R627 Adult failure to thrive: Secondary | ICD-10-CM

## 2019-06-12 DIAGNOSIS — E440 Moderate protein-calorie malnutrition: Secondary | ICD-10-CM

## 2019-06-12 DIAGNOSIS — R634 Abnormal weight loss: Secondary | ICD-10-CM

## 2019-06-12 LAB — COMPREHENSIVE METABOLIC PANEL
ALT: 24 U/L (ref 0–44)
AST: 22 U/L (ref 15–41)
Albumin: 2.9 g/dL — ABNORMAL LOW (ref 3.5–5.0)
Alkaline Phosphatase: 119 U/L (ref 38–126)
Anion gap: 13 (ref 5–15)
BUN: 64 mg/dL — ABNORMAL HIGH (ref 8–23)
CO2: 25 mmol/L (ref 22–32)
Calcium: 8.9 mg/dL (ref 8.9–10.3)
Chloride: 100 mmol/L (ref 98–111)
Creatinine, Ser: 11.08 mg/dL — ABNORMAL HIGH (ref 0.61–1.24)
GFR calc Af Amer: 5 mL/min — ABNORMAL LOW (ref >60)
GFR calc non Af Amer: 4 mL/min — ABNORMAL LOW (ref >60)
Glucose, Bld: 162 mg/dL — ABNORMAL HIGH (ref 70–99)
Potassium: 4.3 mmol/L (ref 3.5–5.1)
Sodium: 138 mmol/L (ref 135–145)
Total Bilirubin: 0.5 mg/dL (ref 0.3–1.2)
Total Protein: 6.8 g/dL (ref 6.5–8.1)

## 2019-06-12 LAB — CBC WITH DIFFERENTIAL/PLATELET
Abs Immature Granulocytes: 0.04 10*3/uL (ref 0.00–0.07)
Basophils Absolute: 0.1 10*3/uL (ref 0.0–0.1)
Basophils Relative: 1 %
Eosinophils Absolute: 0.5 10*3/uL (ref 0.0–0.5)
Eosinophils Relative: 6 %
HCT: 31.8 % — ABNORMAL LOW (ref 39.0–52.0)
Hemoglobin: 9.9 g/dL — ABNORMAL LOW (ref 13.0–17.0)
Immature Granulocytes: 1 %
Lymphocytes Relative: 21 %
Lymphs Abs: 1.7 10*3/uL (ref 0.7–4.0)
MCH: 28.5 pg (ref 26.0–34.0)
MCHC: 31.1 g/dL (ref 30.0–36.0)
MCV: 91.6 fL (ref 80.0–100.0)
Monocytes Absolute: 0.9 10*3/uL (ref 0.1–1.0)
Monocytes Relative: 11 %
Neutro Abs: 4.7 10*3/uL (ref 1.7–7.7)
Neutrophils Relative %: 60 %
Platelets: 179 10*3/uL (ref 150–400)
RBC: 3.47 MIL/uL — ABNORMAL LOW (ref 4.22–5.81)
RDW: 14.6 % (ref 11.5–15.5)
WBC: 7.8 10*3/uL (ref 4.0–10.5)
nRBC: 0 % (ref 0.0–0.2)

## 2019-06-12 LAB — HIV ANTIBODY (ROUTINE TESTING W REFLEX): HIV Screen 4th Generation wRfx: NONREACTIVE

## 2019-06-12 LAB — GLUCOSE, CAPILLARY
Glucose-Capillary: 126 mg/dL — ABNORMAL HIGH (ref 70–99)
Glucose-Capillary: 136 mg/dL — ABNORMAL HIGH (ref 70–99)
Glucose-Capillary: 137 mg/dL — ABNORMAL HIGH (ref 70–99)
Glucose-Capillary: 155 mg/dL — ABNORMAL HIGH (ref 70–99)
Glucose-Capillary: 198 mg/dL — ABNORMAL HIGH (ref 70–99)
Glucose-Capillary: 84 mg/dL (ref 70–99)

## 2019-06-12 LAB — CK: Total CK: 40 U/L — ABNORMAL LOW (ref 49–397)

## 2019-06-12 LAB — PREALBUMIN: Prealbumin: 17.4 mg/dL — ABNORMAL LOW (ref 18–38)

## 2019-06-12 LAB — VITAMIN B12: Vitamin B-12: 1264 pg/mL — ABNORMAL HIGH (ref 180–914)

## 2019-06-12 LAB — CORTISOL-AM, BLOOD: Cortisol - AM: 13.4 ug/dL (ref 6.7–22.6)

## 2019-06-12 MED ORDER — IOHEXOL 300 MG/ML  SOLN
100.0000 mL | Freq: Once | INTRAMUSCULAR | Status: AC | PRN
  Administered 2019-06-12: 100 mL via INTRAVENOUS

## 2019-06-12 NOTE — TOC Progression Note (Signed)
Transition of Care [facility]) - Progression Note    Patient Details  Name: Blake [name] MRN: [id number] Date of Birth: 1946-03-02  Transition of Care [facility]) CM/SW Contact  Graves-Bigelow, Ocie Cornfield, RN Phone Number: 06/12/2019, 10:06 AM  Clinical Narrative:  Patient has been accepted at [facility]. Additional workup to be provided by MD- awaiting labs, nutrition consult and possible GI consult. CM will continue to follow for additional transition of care needs. Pt will need PTAR once stable for transport.     Expected Discharge Plan: Home/Self Care Barriers to Discharge: No Barriers Identified  Expected Discharge Plan and Services Expected Discharge Plan: Home/Self Care In-house Referral: NA Discharge Planning Services: CM Consult Post Acute Care Choice: NA Living arrangements for the past 2 months: Single Family Home                    HH Arranged: Refused HH(Case Manager did state to call PCP if needs assistance in the future.)      Social Determinants of Health (SDOH) Interventions    Readmission Risk Interventions Readmission Risk Prevention Plan 06/11/2019  Transportation Screening Complete  HRI or [place] Complete  Social Work Consult for [place] Planning/Counseling Complete  Palliative Care Screening Not Applicable  Medication Review Press photographer) Complete  Some recent data might be hidden

## 2019-06-12 NOTE — Progress Notes (Signed)
PROGRESS NOTE    SIMEON DEBES  [id number] DOB: [birth year] DOA: 06/09/2019 PCP: [name], MD      Brief Narrative:  Mr. Mandia is a 73 y.o. 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  In the ER, the patient was weak and appeared dyspneic, and so when he was noted to have elevated BNP from baseline, it was thought he was fluid overloaded and so he was dialyzed from the ER with plans for d/c home.  After dialysis, however, he was unchanged, and reported chest pain and so the hospitalist service were asked to observe.       Assessment & Plan:   Anorexia/decreased oral intake leading to weight loss and failure to thrive  Moderate protein calorie malnutrition Patient vague initially about onset/timing of symptoms, but is now clear that this has been slowly progressive for months.  Associated with 20 lbs weight loss.    No prominent GI symptoms (heart burn, dysphagia, early satiety, abdominal pain, diarrhea, hematochezia, fever) but he does not he occasionally regurgitates, has to chew food more before he can swallow.  Here, evaluation of "fatigue" and dyspnea showed: Orthostatic BPs normal.  Not anemic relative to baseline.  EF normal on echo 1 month ago and does not appear fluid overloaded and BNP only slightly up. CTA chest shows no PE or infection, maybe a little chronic edema and emphysema.  Not hypoxic with exertion.  No fever or clinical or laboratory findings to point to infection.  TSH normal on recent admission.  AM cortisol normal, HIV negative, B12 replete.   Prealbumin 17, albumin 2.9, LFTs normal, Hgb at baseline no other CBC irregularities.    I am concerned about esophagitis or cancer of the GI tract, esp. Given his CT findings from April.   -Repeat CT abdomen -Consult GI, appreciate guidance  -Consult nutrition   ESRD -Continue phoslo -Consult Nephrology for routine HD   Chest pain, noncardiac This was the initial stated reason for admission.  It was however, transient, non-exertional, clinically not suspicious for ACS.  Has been adherent to Plavix.  Troponin low and flat.  Telemetry shows high PVC burden only. No further chest pain.  Diabetes Glucoses controlled -Continue SSI corrections  Coronary disease secondary prevention S/p DES x2 in Aug 2020 Hypertension BP normal -Continue aspirin, Plavix -Continue atorvastatin -Continue metoprolol  Unstageable pressure injury left lateral ankle, POA  Other meds -Continue SSRI -Continue PPI  Anemia of CKD No active bleeding, Hgb stable relative to baseline.      MDM and disposition: The below labs and imaging reports were reviewed and summarized above.  Medication management as above.  The patient was admitted with fatigue, weight loss, malaise, and chest pain.  The chest pain is been worked up and does not appear to be ischemic, no further work-up expected.  However he remains significantly debilitated, able to eat only a few bites of food at each meal (a hard boiled egg and one strip toast for breakfast, or half a cup of tuna for lunch only) and has an unexplained weight loss with abnormal findings on CT of the abdomen/esophagus.  We will obtain GI consultation and repeat CT imaging of the abdomen.  If the CT is unremarkable, and GI work-up is complete, discharge planned in the next couple days to skilled nursing  facility.      DVT prophylaxis: Heparin Code Status: FULL Family Communication:     Procedures:   CTA chest 9/27 --- no PE  CT abdomen and pelvis with contrast 9/29 -- pending    Subjective: Still tired.  No fever, cough, sputum.  No abdominal pain, diarrhea.  No chest pain.  Still extremely tired, out of breath with just a few steps.        Objective: Vitals:   06/11/19 1454  06/11/19 2121 06/11/19 2130 06/12/19 0411  BP: (!) 149/66 (!) 140/53 (!) 140/53 (!) 122/47  Pulse: (!) 59 (!) 59 60 71  Resp:  18  20  Temp: 97.8 F (36.6 C) 97.8 F (36.6 C)  98.6 F (37 C)  TempSrc: Oral Oral  Oral  SpO2: 100% 93%  94%  Weight:    79 kg  Height:        Intake/Output Summary (Last 24 hours) at 06/12/2019 1225 Last data filed at 06/12/2019 1000 Gross per 24 hour  Intake 1324 ml  Output -  Net 1324 ml   Filed Weights   06/10/19 0410 06/11/19 0427 06/12/19 0411  Weight: 77.2 kg 78.1 kg 79 kg    Examination: General appearance: Adult male, lying in bed, listless, interactive. HEENT: Anicteric, conjunctival pink, lids and lashes normal.  No nasal deformity, discharge, or epistaxis.  Lips moist, oropharynx tacky dry, no oral lesions, hearing normal.    Skin: Skin warm and dry, skin wan, no jaundice. Cardiac: RRR, no murmurs, JVP normal, no lower extremity edema. Respiratory: Respiratory effort appears normal without exertion.  No rales or wheezes. Abdomen: Abdomen soft without tenderness palpation or guarding, no ascites or distention no masses appreciated.   MSK: Diffuse loss of subcutaneous muscle mass and fat, severe thenar wasting, temporal wasting. Neuro: Awake and alert, extraocular movements intact, moves all extremities with normal strength and coordination, speech fluent.    Psych: Sensorium intact responding to questions, attention normal, affect blunted, judgment and insight appear normal.   Data Reviewed: I have personally reviewed following labs and imaging studies:  CBC: Recent Labs  Lab 06/09/19 0650 06/10/19 0237 06/12/19 0354  WBC 9.2 6.6 7.8  NEUTROABS 7.6  --  4.7  HGB 10.0* 9.7* 9.9*  HCT 31.7* 30.1* 31.8*  MCV 92.7 90.4 91.6  PLT 167 157 0000000   Basic Metabolic Panel: Recent Labs  Lab 06/09/19 0650 06/09/19 2130 06/10/19 0237 06/12/19 0354  NA 141 138 140 138  K 3.7 3.4* 3.1* 4.3  CL 103 98 99 100  CO2 21* 26 28 25   GLUCOSE  200* 140* 94 162*  BUN 65* 31* 33* 64*  CREATININE 11.61* 7.04* 7.59* 11.08*  CALCIUM 9.0 8.8* 9.0 8.9   GFR: Estimated Creatinine Clearance: 5.9 mL/min (A) (by C-G formula based on SCr of 11.08 mg/dL (H)). Liver Function Tests: Recent Labs  Lab 06/09/19 2130 06/12/19 0354  AST 44* 22  ALT 34 24  ALKPHOS 126 119  BILITOT 0.9 0.5  PROT 7.4 6.8  ALBUMIN 3.1* 2.9*   No results for input(s): LIPASE, AMYLASE in the last 168 hours. No results for input(s): AMMONIA in the last 168 hours. Coagulation Profile: No results for input(s): INR, PROTIME in the last 168 hours. Cardiac Enzymes: Recent Labs  Lab 06/12/19 0354  CKTOTAL 40*   BNP (last 3 results) No results for input(s): PROBNP in the last 8760 hours. HbA1C: Recent Labs    06/10/19 0237  HGBA1C 6.2*   CBG: Recent Labs  Lab 06/11/19 1934 06/12/19 0007 06/12/19 0408 06/12/19 0806 06/12/19 1134  GLUCAP 143* 198* 155* 84 126*   Lipid Profile: No results for input(s): CHOL, HDL, LDLCALC, TRIG, CHOLHDL, LDLDIRECT in the last 72 hours. Thyroid Function Tests: No results for input(s): TSH, T4TOTAL, FREET4, T3FREE, THYROIDAB in the last 72 hours. Anemia Panel: Recent Labs    06/12/19 0354  VITAMINB12 1,264*   Urine analysis:    Component Value Date/Time   COLORURINE YELLOW 08/01/2015 0642   APPEARANCEUR CLEAR 08/01/2015 0642   LABSPEC 1.013 08/01/2015 0642   PHURINE 8.0 08/01/2015 0642   GLUCOSEU 100 (A) 08/01/2015 0642   HGBUR MODERATE (A) 08/01/2015 0642   BILIRUBINUR NEGATIVE 08/01/2015 0642   KETONESUR NEGATIVE 08/01/2015 0642   PROTEINUR 100 (A) 08/01/2015 0642   UROBILINOGEN 0.2 06/28/2014 1417   NITRITE NEGATIVE 08/01/2015 0642   LEUKOCYTESUR NEGATIVE 08/01/2015 0642   Sepsis Labs: @LABRCNTIP (procalcitonin:4,lacticacidven:4)  ) Recent Results (from the past 240 hour(s))  SARS CORONAVIRUS 2 (TAT 6-24 HRS) Nasopharyngeal Nasopharyngeal Swab     Status: None   Collection Time: 06/09/19  8:24 AM    Specimen: Nasopharyngeal Swab  Result Value Ref Range Status   SARS Coronavirus 2 NEGATIVE NEGATIVE Final    Comment: (NOTE) SARS-CoV-2 target nucleic acids are NOT DETECTED. The SARS-CoV-2 RNA is generally detectable in upper and lower respiratory specimens during the acute phase of infection. Negative results do not preclude SARS-CoV-2 infection, do not rule out co-infections with other pathogens, and should not be used as the sole basis for treatment or other patient management decisions. Negative results must be combined with clinical observations, patient history, and epidemiological information. The expected result is Negative. Fact Sheet for Patients: SugarRoll.be Fact Sheet for Healthcare Providers: https://www.woods-mathews.com/ This test is not yet approved or cleared by the [place] FDA and  has been authorized for detection and/or diagnosis of SARS-CoV-2 by FDA under an Emergency Use Authorization (EUA). This EUA will remain  in effect (meaning this test can be used) for the duration of the COVID-19 declaration under Section 56 4(b)(1) of the Act, 21 U.S.C. section 360bbb-3(b)(1), unless the authorization is terminated or revoked sooner. Performed at [facility], [place] [address], [place], [place] [address]          Radiology Studies: Ct Angio Chest Pe W Or Wo Contrast  Result Date: 06/10/2019 CLINICAL DATA:  Shortness of breath and edema. History of end-stage renal disease. EXAM: CT ANGIOGRAPHY CHEST WITH CONTRAST TECHNIQUE: Multidetector CT imaging of the chest was performed using the standard protocol during bolus administration of intravenous contrast. Multiplanar CT image reconstructions and MIPs were obtained to evaluate the vascular anatomy. CONTRAST:  47mL OMNIPAQUE IOHEXOL 350 MG/ML SOLN COMPARISON:  04/29/2019 FINDINGS: Cardiovascular: The pulmonary arteries are adequately opacified. There is no evidence of  pulmonary embolism. The heart size is stable and at the upper limits of normal. Coronary stent present. No pericardial fluid identified. The thoracic aorta is normal in caliber. Mediastinum/Nodes: Stable mildly enlarged mediastinal lymph nodes and enlarged right hilar lymph nodes. Lungs/Pleura: Stable chronic lung disease with more prominent disease in the right lung compared to the left. It would be difficult to exclude subtle areas infection but overall appearance of both lungs appears very similar since the prior CTA. Probable component of mild chronic pulmonary interstitial edema. No pleural effusions. No pneumothorax. No pulmonary masses. Upper Abdomen: No acute abnormality. Musculoskeletal: No chest wall abnormality. No acute or significant osseous findings. Review of the MIP images confirms  the above findings. IMPRESSION: 1. No evidence of pulmonary embolism. 2. Stable mildly enlarged mediastinal lymph nodes and enlarged right hilar lymph nodes. 3. Stable chronic lung disease, right greater than left. There may be a component of mild chronic pulmonary interstitial edema. Electronically Signed   By: Aletta Edouard M.D.   On: 06/10/2019 16:05        Scheduled Meds: . aspirin  324 mg Oral Once  . aspirin EC  81 mg Oral Daily  . atorvastatin  40 mg Oral q1800  . Chlorhexidine Gluconate Cloth  6 each Topical Q0600  . clopidogrel  75 mg Oral Daily  . doxercalciferol  2 mcg Intravenous Q T,Th,Sa-HD  . feeding supplement (NEPRO CARB STEADY)  237 mL Oral BID BM  . heparin  5,000 Units Subcutaneous Q8H  . insulin aspart  0-9 Units Subcutaneous Q4H  . metoprolol tartrate  12.5 mg Oral BID  . pantoprazole  40 mg Oral BID  . PARoxetine  10 mg Oral Daily  . sevelamer carbonate  2,400 mg Oral TID WC  . sodium chloride flush  3 mL Intravenous Q12H   Continuous Infusions: . sodium chloride       LOS: 2 days    Time spent: 25 minutes    Edwin Dada, MD [facility]  06/12/2019, 12:25 PM     Please page through [place]:  [email or web address] Password [email or web address] If 7PM-7AM, please contact night-coverage

## 2019-06-12 NOTE — Progress Notes (Signed)
[name] Progress Note   Subjective:  Seen in room. Tired. Denies CP/SOB. He's unsure of discharge planning. For dialysis today.    Objective Vitals:   06/11/19 1454 06/11/19 2121 06/11/19 2130 06/12/19 0411  BP: (!) 149/66 (!) 140/53 (!) 140/53 (!) 122/47  Pulse: (!) 59 (!) 59 60 71  Resp:  18  20  Temp: 97.8 F (36.6 C) 97.8 F (36.6 C)  98.6 F (37 C)  TempSrc: Oral Oral  Oral  SpO2: 100% 93%  94%  Weight:    79 kg  Height:        Physical Exam General: WNWD male, fatigued  Heart: RRR Lungs: CTAB  Abdomen: soft NTND Extremities: No LE edema  Dialysis Access: R IJ TDC dsg clean    Weight change: 0.936 kg   Additional Objective Labs: Basic Metabolic Panel: Recent Labs  Lab 06/09/19 2130 06/10/19 0237 06/12/19 0354  NA 138 140 138  K 3.4* 3.1* 4.3  CL 98 99 100  CO2 26 28 25   GLUCOSE 140* 94 162*  BUN 31* 33* 64*  CREATININE 7.04* 7.59* 11.08*  CALCIUM 8.8* 9.0 8.9   CBC: Recent Labs  Lab 06/09/19 0650 06/10/19 0237 06/12/19 0354  WBC 9.2 6.6 7.8  NEUTROABS 7.6  --  4.7  HGB 10.0* 9.7* 9.9*  HCT 31.7* 30.1* 31.8*  MCV 92.7 90.4 91.6  PLT 167 157 179   Blood Culture    Component Value Date/Time   SDES BLOOD LEFT HAND 04/30/2019 2058   SPECREQUEST  04/30/2019 2058    BOTTLES DRAWN AEROBIC ONLY Blood Culture results may not be optimal due to an inadequate volume of blood received in culture bottles   CULT  04/30/2019 2058    NO GROWTH 5 DAYS Performed at [facility], [address] [place], [place] [address]    REPTSTATUS 05/05/2019 FINAL 04/30/2019 2058     Medications: . sodium chloride     . aspirin  324 mg Oral Once  . aspirin EC  81 mg Oral Daily  . atorvastatin  40 mg Oral q1800  . Chlorhexidine Gluconate Cloth  6 each Topical Q0600  . clopidogrel  75 mg Oral Daily  . doxercalciferol  2 mcg Intravenous Q T,Th,Sa-HD  . feeding supplement (NEPRO CARB STEADY)  237 mL Oral BID BM  . heparin  5,000 Units  Subcutaneous Q8H  . insulin aspart  0-9 Units Subcutaneous Q4H  . metoprolol tartrate  12.5 mg Oral BID  . pantoprazole  40 mg Oral BID  . PARoxetine  10 mg Oral Daily  . sevelamer carbonate  2,400 mg Oral TID WC  . sodium chloride flush  3 mL Intravenous Q12H   Dialysis Orders:  NW TTS 4H F180 BFR 400 EDW 81kg. 2K/3.5Ca  Na linear TDC Heparin 6000  Mircera 30 Hectorol 2  Renvela 800 mg 3 tabs TID Midodrine 10mg  before dialysis  Assessment/Plan: 1.  Shortness of breath/CP: CXR on 06/09/2019 consistent with pulmonary edema, in the setting of missed dialysis treatment. No PE on chest CT. Clinically improved after dialysis and SOB resolved.  2.  ESRD:  HD TTS. Next HD 9/29.  3.  Hypertension/volume: Pulmonary edema on presentation, SOB resolved after urgent HD on 9/26. Takes midodrine before dialysis as an outpatient but BP remains moderately elevated here. Continue to titrate down EDW as tolerated.  4.  Anemia: Hemoglobin 9.9 on mircera as outpatient. Consider starting aranesp with HD if remains admitted, otherwise increase dose of  mircera at discharge.  5.  Metabolic bone disease: Corr Calcium 9.7. Continue hectorol and renvela.  6. CAD s/p DES x 2 - Continues aspirin, Plavix, metoprolol, statin.  7. [place] - PT recommending SNF. Patient declining SNF/HH.   Lynnda Child PA-C [facility] Pager [phone] 06/12/2019,9:00 AM  LOS: 2 days

## 2019-06-12 NOTE — Progress Notes (Signed)
Initial Nutrition Assessment  INTERVENTION:   -Nepro Shake po BID, each supplement provides 425 kcal and 19 grams protein  NUTRITION DIAGNOSIS:   Increased nutrient needs related to chronic illness as evidenced by estimated needs.  GOAL:   Patient will meet greater than or equal to 90% of their needs  MONITOR:   PO intake, Supplement acceptance, Labs, Weight trends, I & O's  REASON FOR ASSESSMENT:   Consult Assessment of nutrition requirement/status  ASSESSMENT:   73 y.o. 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  **RD working remotely**  Patient currently consuming 100% of meals. PO intakes have improved since admission. PTA pt was having nausea and not eating as well. Pt has been ordered Nepro supplements, drinking them with no issue.  Pt receives HD on a TTS schedule. Last HD was 9/26. Next is scheduled for today.   Per weight records, pt weighed 190 lbs on 3/31. Pt has lost 20 lbs since then (10% wt loss x 6 months, significant for time frame).   EDW: 81 kg (178 lbs). I/Os: +112 ml HD UF 9/26: 2172 ml  Medications: Renvela tablet Labs reviewed: CBGs: 84-126  NUTRITION - FOCUSED PHYSICAL EXAM:  Unable to perform -working remotely.  Diet Order:   Diet Order            Diet renal/carb modified with fluid restriction Diet-HS Snack? Nothing; Fluid restriction: 1200 mL Fluid; Room service appropriate? Yes; Fluid consistency: Thin  Diet effective now              EDUCATION NEEDS:   No education needs have been identified at this time  Skin:  Skin Assessment: Reviewed RN Assessment  Last BM:  9/25  Height:   Ht Readings from Last 1 Encounters:  06/10/19 5\' 8"  (1.727 m)    Weight:   Wt Readings from Last 1 Encounters:  06/12/19 79 kg    Ideal Body Weight:  70 kg  BMI:  Body mass index is 26.48  kg/m.  Estimated Nutritional Needs:   Kcal:  2050-2250  Protein:  110-120g  Fluid:  UOP + 1L  Clayton Bibles, MS, RD, LDN Inpatient Clinical Dietitian Pager: [phone] After Hours Pager: [phone]

## 2019-06-13 LAB — CBC
HCT: 29 % — ABNORMAL LOW (ref 39.0–52.0)
Hemoglobin: 9.2 g/dL — ABNORMAL LOW (ref 13.0–17.0)
MCH: 29 pg (ref 26.0–34.0)
MCHC: 31.7 g/dL (ref 30.0–36.0)
MCV: 91.5 fL (ref 80.0–100.0)
Platelets: 158 10*3/uL (ref 150–400)
RBC: 3.17 MIL/uL — ABNORMAL LOW (ref 4.22–5.81)
RDW: 14.5 % (ref 11.5–15.5)
WBC: 7.5 10*3/uL (ref 4.0–10.5)
nRBC: 0 % (ref 0.0–0.2)

## 2019-06-13 LAB — GLUCOSE, CAPILLARY
Glucose-Capillary: 110 mg/dL — ABNORMAL HIGH (ref 70–99)
Glucose-Capillary: 118 mg/dL — ABNORMAL HIGH (ref 70–99)
Glucose-Capillary: 151 mg/dL — ABNORMAL HIGH (ref 70–99)
Glucose-Capillary: 180 mg/dL — ABNORMAL HIGH (ref 70–99)
Glucose-Capillary: 81 mg/dL (ref 70–99)

## 2019-06-13 LAB — RENAL FUNCTION PANEL
Albumin: 2.8 g/dL — ABNORMAL LOW (ref 3.5–5.0)
Anion gap: 15 (ref 5–15)
BUN: 80 mg/dL — ABNORMAL HIGH (ref 8–23)
CO2: 22 mmol/L (ref 22–32)
Calcium: 8.7 mg/dL — ABNORMAL LOW (ref 8.9–10.3)
Chloride: 98 mmol/L (ref 98–111)
Creatinine, Ser: 11.73 mg/dL — ABNORMAL HIGH (ref 0.61–1.24)
GFR calc Af Amer: 4 mL/min — ABNORMAL LOW (ref >60)
GFR calc non Af Amer: 4 mL/min — ABNORMAL LOW (ref >60)
Glucose, Bld: 134 mg/dL — ABNORMAL HIGH (ref 70–99)
Phosphorus: 2.4 mg/dL — ABNORMAL LOW (ref 2.5–4.6)
Potassium: 4.6 mmol/L (ref 3.5–5.1)
Sodium: 135 mmol/L (ref 135–145)

## 2019-06-13 LAB — FOLATE RBC
Folate, Hemolysate: >620 ng/mL
Folate, RBC: >1968 ng/mL (ref >498)
Hematocrit: 31.5 % — ABNORMAL LOW (ref 37.5–51.0)

## 2019-06-13 MED ORDER — HEPARIN SODIUM (PORCINE) 1000 UNIT/ML IJ SOLN
INTRAMUSCULAR | Status: AC
  Administered 2019-06-13: 5000 [IU] via INTRAVENOUS_CENTRAL
  Filled 2019-06-13: qty 5

## 2019-06-13 MED ORDER — SODIUM CHLORIDE 0.9 % IV SOLN: 100.0000 mL | INTRAVENOUS | Status: DC | PRN

## 2019-06-13 MED ORDER — HEPARIN SODIUM (PORCINE) 1000 UNIT/ML IJ SOLN
INTRAMUSCULAR | Status: AC
  Administered 2019-06-13: 3000 [IU] via INTRAVENOUS_CENTRAL
  Filled 2019-06-13: qty 4

## 2019-06-13 MED ORDER — PENTAFLUOROPROP-TETRAFLUOROETH EX AERO: 1.0000 "application " | INHALATION_SPRAY | CUTANEOUS | Status: DC | PRN

## 2019-06-13 MED ORDER — ALTEPLASE 2 MG IJ SOLR: 2.0000 mg | Freq: Once | INTRAMUSCULAR | Status: DC | PRN

## 2019-06-13 MED ORDER — DOXERCALCIFEROL 4 MCG/2ML IV SOLN
INTRAVENOUS | Status: AC
  Administered 2019-06-13: 2 ug via INTRAVENOUS
  Filled 2019-06-13: qty 2

## 2019-06-13 MED ORDER — HEPARIN SODIUM (PORCINE) 1000 UNIT/ML DIALYSIS
20.0000 [IU]/kg | INTRAMUSCULAR | Status: DC | PRN
  Administered 2019-06-14: 11:00:00 3000 [IU] via INTRAVENOUS_CENTRAL

## 2019-06-13 MED ORDER — LIDOCAINE HCL (PF) 1 % IJ SOLN: 5.0000 mL | INTRAMUSCULAR | Status: DC | PRN

## 2019-06-13 MED ORDER — LIDOCAINE-PRILOCAINE 2.5-2.5 % EX CREA: 1.0000 "application " | TOPICAL_CREAM | CUTANEOUS | Status: DC | PRN

## 2019-06-13 MED ORDER — CHLORHEXIDINE GLUCONATE CLOTH 2 % EX PADS
6.0000 | MEDICATED_PAD | Freq: Every day | CUTANEOUS | Status: DC
  Administered 2019-06-14 – 2019-06-15 (×2): 6 via TOPICAL

## 2019-06-13 MED ORDER — HEPARIN SODIUM (PORCINE) 1000 UNIT/ML DIALYSIS
1000.0000 [IU] | INTRAMUSCULAR | Status: DC | PRN
  Administered 2019-06-13: 07:00:00 5000 [IU] via INTRAVENOUS_CENTRAL
  Administered 2019-06-13: 11:00:00 3000 [IU] via INTRAVENOUS_CENTRAL

## 2019-06-13 NOTE — Progress Notes (Signed)
PROGRESS NOTE        PATIENT DETAILS Name: Blake [name] Age: 73 y.o. Sex: male Date of Birth: 03-05-1946 Admit Date: 06/09/2019 Admitting Physician Vianne Bulls, MD [id number], Mauro Kaufmann, MD  Brief Narrative: Patient is a 73 y.o. male with history of ESRD on HD TTS, CAD s/p PCI 1 month back, HTN, IDDM-who presented with fatigue and atypical chest pain.  Subjective: Seen earlier this morning at HD-denies any chest pain or shortness of breath.  Lying comfortably in bed.  Assessment/Plan: Failure to thrive syndrome/protein calorie malnutrition/weight loss: No CT evidence of any malignancy-evaluated by GI-given that the patient just recently had a PCI and is on dual antiplatelet agents-GI recommends deferring any further endoscopic evaluation to the outpatient setting.  Chest pain: Felt to be atypical-could be related to GI issues-esophagitis-continue PPI.  Has history of CAD-see below  History of CAD-with recent non-STEMI in August 2020 requiring DES to circumflex and LAD: Remains on dual antiplatelet agents with aspirin/Plavix along with beta-blocker and statin.  ESRD on HD TTS: Nephrology following for HD care.  DM-2: CBG stable-continue with SSI  Anemia: Likely related to ESRD-stable-continue to follow closely.  GERD/esophagitis: Continue PPI.  Unstageable pressure injury left lateral ankle, POA  Diet: Diet Order            Diet renal/carb modified with fluid restriction Diet-HS Snack? Nothing; Fluid restriction: 1200 mL Fluid; Room service appropriate? Yes; Fluid consistency: Thin  Diet effective now               DVT Prophylaxis: Prophylactic Heparin   Code Status: Full code   Family Communication: None at bedside  Disposition Plan: Remain inpatient- SNF on discharge-SW following  Antimicrobial agents: Anti-infectives (From admission, onward)   None      Procedures: None CONSULTS:  GI and nephrology  Time spent:  25- minutes-Greater than 50% of this time was spent in counseling, explanation of diagnosis, planning of further management, and coordination of care.  MEDICATIONS: Scheduled Meds: . aspirin  324 mg Oral Once  . aspirin EC  81 mg Oral Daily  . atorvastatin  40 mg Oral q1800  . Chlorhexidine Gluconate Cloth  6 each Topical Q0600  . [START ON 06/14/2019] Chlorhexidine Gluconate Cloth  6 each Topical Q0600  . clopidogrel  75 mg Oral Daily  . doxercalciferol  2 mcg Intravenous Q T,Th,Sa-HD  . feeding supplement (NEPRO CARB STEADY)  237 mL Oral BID BM  . heparin  5,000 Units Subcutaneous Q8H  . insulin aspart  0-9 Units Subcutaneous Q4H  . metoprolol tartrate  12.5 mg Oral BID  . pantoprazole  40 mg Oral BID  . PARoxetine  10 mg Oral Daily  . sevelamer carbonate  2,400 mg Oral TID WC  . sodium chloride flush  3 mL Intravenous Q12H   Continuous Infusions: . sodium chloride     PRN Meds:.sodium chloride, acetaminophen, nitroGLYCERIN, ondansetron (ZOFRAN) IV, sodium chloride flush   PHYSICAL EXAM: Vital signs: Vitals:   06/13/19 1100 06/13/19 1137 06/13/19 1219 06/13/19 1221  BP: 129/68 (!) 135/58 134/60 134/60  Pulse: 67 66 66 67  Resp:  17  15  Temp:  98 F (36.7 C)  98.4 F (36.9 C)  TempSrc:  Oral  Oral  SpO2:  95%  90%  Weight:  77.9 kg    Height:  Filed Weights   06/13/19 0455 06/13/19 0730 06/13/19 1137  Weight: 80.4 kg 80.4 kg 77.9 kg   Body mass index is 26.11 kg/m.   Gen Exam:Alert awake-not in any distress HEENT:atraumatic, normocephalic Chest: B/L clear to auscultation anteriorly CVS:S1S2 regular Abdomen:soft non tender, non distended Extremities:no edema Neurology: Non focal Skin: no rash  I have personally reviewed following labs and imaging studies  LABORATORY DATA: CBC: Recent Labs  Lab 06/09/19 0650 06/10/19 0237 06/12/19 0354 06/13/19 0757  WBC 9.2 6.6 7.8 7.5  NEUTROABS 7.6  --  4.7  --   HGB 10.0* 9.7* 9.9* 9.2*  HCT 31.7* 30.1*  31.8* 29.0*  MCV 92.7 90.4 91.6 91.5  PLT 167 157 179 0000000    Basic Metabolic Panel: Recent Labs  Lab 06/09/19 0650 06/09/19 2130 06/10/19 0237 06/12/19 0354 06/13/19 0757  NA 141 138 140 138 135  K 3.7 3.4* 3.1* 4.3 4.6  CL 103 98 99 100 98  CO2 21* 26 28 25 22   GLUCOSE 200* 140* 94 162* 134*  BUN 65* 31* 33* 64* 80*  CREATININE 11.61* 7.04* 7.59* 11.08* 11.73*  CALCIUM 9.0 8.8* 9.0 8.9 8.7*  PHOS  --   --   --   --  2.4*    GFR: Estimated Creatinine Clearance: 5.6 mL/min (A) (by C-G formula based on SCr of 11.73 mg/dL (H)).  Liver Function Tests: Recent Labs  Lab 06/09/19 2130 06/12/19 0354 06/13/19 0757  AST 44* 22  --   ALT 34 24  --   ALKPHOS 126 119  --   BILITOT 0.9 0.5  --   PROT 7.4 6.8  --   ALBUMIN 3.1* 2.9* 2.8*   No results for input(s): LIPASE, AMYLASE in the last 168 hours. No results for input(s): AMMONIA in the last 168 hours.  Coagulation Profile: No results for input(s): INR, PROTIME in the last 168 hours.  Cardiac Enzymes: Recent Labs  Lab 06/12/19 0354  CKTOTAL 40*    BNP (last 3 results) No results for input(s): PROBNP in the last 8760 hours.  HbA1C: No results for input(s): HGBA1C in the last 72 hours.  CBG: Recent Labs  Lab 06/12/19 1638 06/12/19 2053 06/13/19 0013 06/13/19 0448 06/13/19 1211  GLUCAP 136* 137* 180* 151* 81    Lipid Profile: No results for input(s): CHOL, HDL, LDLCALC, TRIG, CHOLHDL, LDLDIRECT in the last 72 hours.  Thyroid Function Tests: No results for input(s): TSH, T4TOTAL, FREET4, T3FREE, THYROIDAB in the last 72 hours.  Anemia Panel: Recent Labs    06/12/19 0354  VITAMINB12 1,264*    Urine analysis:    Component Value Date/Time   COLORURINE YELLOW 08/01/2015 0642   APPEARANCEUR CLEAR 08/01/2015 0642   LABSPEC 1.013 08/01/2015 0642   PHURINE 8.0 08/01/2015 0642   GLUCOSEU 100 (A) 08/01/2015 0642   HGBUR MODERATE (A) 08/01/2015 0642   BILIRUBINUR NEGATIVE 08/01/2015 0642   KETONESUR  NEGATIVE 08/01/2015 0642   PROTEINUR 100 (A) 08/01/2015 0642   UROBILINOGEN 0.2 06/28/2014 1417   NITRITE NEGATIVE 08/01/2015 0642   LEUKOCYTESUR NEGATIVE 08/01/2015 0642    Sepsis Labs: Lactic Acid, Venous    Component Value Date/Time   LATICACIDVEN 1.5 12/09/2018 [id number]    MICROBIOLOGY: Recent Results (from the past 240 hour(s))  SARS CORONAVIRUS 2 (TAT 6-24 HRS) Nasopharyngeal Nasopharyngeal Swab     Status: None   Collection Time: 06/09/19  8:24 AM   Specimen: Nasopharyngeal Swab  Result Value Ref Range Status   SARS Coronavirus 2 NEGATIVE  NEGATIVE Final    Comment: (NOTE) SARS-CoV-2 target nucleic acids are NOT DETECTED. The SARS-CoV-2 RNA is generally detectable in upper and lower respiratory specimens during the acute phase of infection. Negative results do not preclude SARS-CoV-2 infection, do not rule out co-infections with other pathogens, and should not be used as the sole basis for treatment or other patient management decisions. Negative results must be combined with clinical observations, patient history, and epidemiological information. The expected result is Negative. Fact Sheet for Patients: SugarRoll.be Fact Sheet for Healthcare Providers: https://www.woods-mathews.com/ This test is not yet approved or cleared by the [place] FDA and  has been authorized for detection and/or diagnosis of SARS-CoV-2 by FDA under an Emergency Use Authorization (EUA). This EUA will remain  in effect (meaning this test can be used) for the duration of the COVID-19 declaration under Section 56 4(b)(1) of the Act, 21 U.S.C. section 360bbb-3(b)(1), unless the authorization is terminated or revoked sooner. Performed at [facility], [place] [address], [place], [place] [address]     RADIOLOGY STUDIES/RESULTS: Ct Angio Chest Pe W Or Wo Contrast  Result Date: 06/10/2019 CLINICAL DATA:  Shortness of breath and edema. History of  end-stage renal disease. EXAM: CT ANGIOGRAPHY CHEST WITH CONTRAST TECHNIQUE: Multidetector CT imaging of the chest was performed using the standard protocol during bolus administration of intravenous contrast. Multiplanar CT image reconstructions and MIPs were obtained to evaluate the vascular anatomy. CONTRAST:  36mL OMNIPAQUE IOHEXOL 350 MG/ML SOLN COMPARISON:  04/29/2019 FINDINGS: Cardiovascular: The pulmonary arteries are adequately opacified. There is no evidence of pulmonary embolism. The heart size is stable and at the upper limits of normal. Coronary stent present. No pericardial fluid identified. The thoracic aorta is normal in caliber. Mediastinum/Nodes: Stable mildly enlarged mediastinal lymph nodes and enlarged right hilar lymph nodes. Lungs/Pleura: Stable chronic lung disease with more prominent disease in the right lung compared to the left. It would be difficult to exclude subtle areas infection but overall appearance of both lungs appears very similar since the prior CTA. Probable component of mild chronic pulmonary interstitial edema. No pleural effusions. No pneumothorax. No pulmonary masses. Upper Abdomen: No acute abnormality. Musculoskeletal: No chest wall abnormality. No acute or significant osseous findings. Review of the MIP images confirms the above findings. IMPRESSION: 1. No evidence of pulmonary embolism. 2. Stable mildly enlarged mediastinal lymph nodes and enlarged right hilar lymph nodes. 3. Stable chronic lung disease, right greater than left. There may be a component of mild chronic pulmonary interstitial edema. Electronically Signed   By: Aletta Edouard M.D.   On: 06/10/2019 16:05   Ct Abdomen Pelvis W Contrast  Result Date: 06/12/2019 CLINICAL DATA:  73 year old male with unexplained weight loss and abdominal pain. EXAM: CT ABDOMEN AND PELVIS WITH CONTRAST TECHNIQUE: Multidetector CT imaging of the abdomen and pelvis was performed using the standard protocol following bolus  administration of intravenous contrast. CONTRAST:  12mL OMNIPAQUE IOHEXOL 300 MG/ML  SOLN COMPARISON:  12/22/2018 CT FINDINGS: Lower chest: Chronic interstitial changes within both lung bases again noted. Cardiomegaly again identified. Hepatobiliary: The liver and gallbladder are unremarkable. No biliary dilatation. Pancreas: Unremarkable Spleen: Unremarkable Adrenals/Urinary Tract: Bilateral renal atrophy and small indeterminate low-density renal masses are unchanged. No evidence of hydronephrosis or obstructing urinary calculi. The adrenal glands are unremarkable. Mild circumferential bladder wall thickening again noted. Stomach/Bowel: Stomach is within normal limits. Appendix appears normal. No evidence of bowel wall thickening, distention, or inflammatory changes. Vascular/Lymphatic: Aortic atherosclerosis. No enlarged abdominal or pelvic lymph  nodes. Reproductive: Mild prostate enlargement again noted. Other: No ascites, focal collection or pneumoperitoneum. Musculoskeletal: No acute or suspicious bony abnormalities. Moderate to severe degenerative disc disease, spondylosis and broad-based disc bulge at L5-S1 noted. IMPRESSION: 1. No evidence of acute abnormality. 2. Bilateral renal atrophy and small unchanged renal lesions, likely cysts. 3. Mild prostate enlargement and mild circumferential bladder wall thickening again noted. 4. Cardiomegaly with chronic interstitial lung disease/fibrosis. 5.  Aortic Atherosclerosis (ICD10-I70.0). Electronically Signed   By: Margarette Canada M.D.   On: 06/12/2019 16:35   Dg Chest Portable 1 View  Result Date: 06/09/2019 CLINICAL DATA:  Shortness of breath. EXAM: PORTABLE CHEST 1 VIEW COMPARISON:  05/03/2019 FINDINGS: 0644 hours. The cardio pericardial silhouette is enlarged. Diffuse interstitial and central alveolar opacity again noted in both lungs. No substantial pleural effusion. Right IJ dialysis catheter is stable. The visualized bony structures of the thorax are intact.  Insert wire IMPRESSION: Stable exam. Cardiomegaly with diffuse interstitial and central alveolar opacity bilaterally. Imaging features may reflect pulmonary edema although underlying chronic infectious/inflammatory etiology not excluded. Electronically Signed   By: Misty Stanley M.D.   On: 06/09/2019 07:45     LOS: 3 days   Oren Binet, MD  [facility]  If 7PM-7AM, please contact night-coverage  Please page via [email or web address]  Go to [email or web address] and use [place]'s universal password to access. If you do not have the password, please contact the hospital operator.  Locate the [facility] provider you are looking for under [facility] and page to a number that you can be directly reached. If you still have difficulty reaching the provider, please page the [facility] (Director on Call) for the Hospitalists listed on amion for assistance.  06/13/2019, 2:46 PM

## 2019-06-13 NOTE — Progress Notes (Addendum)
[facility] Progress Note   Subjective:  Seen on HD, in good spirits. Tired as usual.    Objective Vitals:   06/13/19 1030 06/13/19 1100 06/13/19 1219 06/13/19 1221  BP: 132/64 129/68 134/60 134/60  Pulse: 67 67 66 67  Resp:    15  Temp:    98.4 F (36.9 C)  TempSrc:    Oral  SpO2:    90%  Weight:      Height:        Physical Exam General: WNWD male, fatigued  Heart: RRR Lungs: CTAB  Abdomen: soft NTND Extremities: No LE edema  Dialysis Access: R IJ TDC dsg clean    Weight change: 1.4 kg   Additional Objective Labs: Basic Metabolic Panel: Recent Labs  Lab 06/10/19 0237 06/12/19 0354 06/13/19 0757  NA 140 138 135  K 3.1* 4.3 4.6  CL 99 100 98  CO2 28 25 22   GLUCOSE 94 162* 134*  BUN 33* 64* 80*  CREATININE 7.59* 11.08* 11.73*  CALCIUM 9.0 8.9 8.7*  PHOS  --   --  2.4*   CBC: Recent Labs  Lab 06/09/19 0650 06/10/19 0237 06/12/19 0354 06/13/19 0757  WBC 9.2 6.6 7.8 7.5  NEUTROABS 7.6  --  4.7  --   HGB 10.0* 9.7* 9.9* 9.2*  HCT 31.7* 30.1* 31.8* 29.0*  MCV 92.7 90.4 91.6 91.5  PLT 167 157 179 158   Blood Culture    Component Value Date/Time   SDES BLOOD LEFT HAND 04/30/2019 2058   SPECREQUEST  04/30/2019 2058    BOTTLES DRAWN AEROBIC ONLY Blood Culture results may not be optimal due to an inadequate volume of blood received in culture bottles   CULT  04/30/2019 2058    NO GROWTH 5 DAYS Performed at [facility], [address], [place], [place] [address]    REPTSTATUS 05/05/2019 FINAL 04/30/2019 2058     Medications: . sodium chloride     . aspirin  324 mg Oral Once  . aspirin EC  81 mg Oral Daily  . atorvastatin  40 mg Oral q1800  . Chlorhexidine Gluconate Cloth  6 each Topical Q0600  . clopidogrel  75 mg Oral Daily  . doxercalciferol  2 mcg Intravenous Q T,Th,Sa-HD  . feeding supplement (NEPRO CARB STEADY)  237 mL Oral BID BM  . heparin  5,000 Units Subcutaneous Q8H  . insulin aspart  0-9 Units Subcutaneous  Q4H  . metoprolol tartrate  12.5 mg Oral BID  . pantoprazole  40 mg Oral BID  . PARoxetine  10 mg Oral Daily  . sevelamer carbonate  2,400 mg Oral TID WC  . sodium chloride flush  3 mL Intravenous Q12H   Dialysis: [facility] T[facility]   4h  400/800   81kg  2K/3.5Ca bath  Hep 6000  R IJ TDC  Mircera 30   Hectorol 2   Renvela 800 mg 3 tabs TID   Midodrine 10mg  before dialysis  Assessment/Plan: 1.  Shortness of breath/pulm edema: due to vol overload.  CXR admit c/w pulmonary edema, in the setting of missed dialysis treatment. No PE on chest CT. Resolved w/ HD.  2.  ESRD:  HD TTS. HD today off sched, rolled over from Tuesday. HD again tomorrow to get back on sched 3.  Hypertension/volume: Takes midodrine before dialysis as OP. Got down to 77.5kg here, back up 80kg today. Dry = 81kg. Lower 77 -78kg at dc.  4.  Anemia: Hemoglobin 9.9 on mircera as  outpatient. Consider starting aranesp with HD if remains admitted, otherwise increase dose of mircera at discharge.  5.  Metabolic bone disease: Corr Calcium 9.7. Continue hectorol and renvela.  6. CAD s/p DES x 2 - Continues aspirin, Plavix, metoprolol, statin.  7. [place] - PT recommending SNF, not sure plan though.  Kelly Splinter, MD 06/13/2019, 12:26 PM

## 2019-06-13 NOTE — Progress Notes (Signed)
PT Cancellation Note  Patient Details Name: Blake [name] MRN: [id number] DOB: [birth year]   Cancelled Treatment:    Reason Eval/Treat Not Completed: (P) Patient at procedure or test/unavailable Pt is off floor in hemodialysis. PT will follow back this afternoon as able.  [name] B. Migdalia Dk PT, DPT Acute Rehabilitation Services Pager [phone] Office [phone]  [place] 06/13/2019, 8:03 AM

## 2019-06-13 NOTE — Consult Note (Signed)
[facility] Consultation Note  Referring Provider: [facility] Primary Care Physician:  [name], MD Primary Gastroenterologist:  Dr. Oretha Caprice  Reason for Consultation:  Weight loss  HPI: Blake [name] is a 73 y.o. male admitted for chest pain, fatigue, failure to thrive.  Has lost over 30+ over past several months.  Has also been fatigued over past several months, with some interval drop in his Hgb.  No hematemesis or overt blood in stool.  No abdominal pain.  Some nausea and vomiting, which he has had intermittently over the years, and for which he saw Dr. Ardis Hughs as outpatient in 2017 and had egd that showed some retained gastric contents otherwise unrevealing.  Unclear if he's had colonoscopy.  CT April showed some thickening distal esophagus; repeat CT abd/pelvis yesterday showed no esophageal thickening and no other worrisome GI tract/organ problems.   Past Medical History:  Diagnosis Date  . Amputation of right great toe ([place]) 08/02/2015  . Arthritis    "maybe RUE/hand" (02/08/2018)  . ESRD (end stage renal disease) on dialysis [facility])    "TTS; [place]" (02/08/2018)  . Hyperlipidemia   . Hypertension   . Type II diabetes mellitus ([place])     Past Surgical History:  Procedure Laterality Date  . AMPUTATION TOE Right 08/04/2015   Procedure: RIGHT GREAT TOE AMPUTATION;  Surgeon: [name], MD;  Location: [place];  Service: Orthopedics;  Laterality: Right;  . AV FISTULA PLACEMENT Right 07/05/2014   Procedure: ARTERIOVENOUS BRACHIALCEPHALIC (AV) FISTULA CREATION;  Surgeon: Conrad Cedar Crest, MD;  Location: [place];  Service: Vascular;  Laterality: Right;  . AV FISTULA PLACEMENT Right 04/24/2019   Procedure: REVISION RIGHT ARM ARTERIOVENOUS (AV) FISTULA;  Surgeon: Serafina Mitchell, MD;  Location: [place];  Service: Vascular;  Laterality: Right;  . BACK SURGERY    . CORONARY STENT INTERVENTION N/A 05/01/2019   Procedure: CORONARY STENT INTERVENTION;  Surgeon: Martinique, Peter M,  MD;  Location: [place] [facility];  Service: Cardiovascular;  Laterality: N/A;  . ESOPHAGOGASTRODUODENOSCOPY (EGD) WITH PROPOFOL N/A 11/28/2015   Procedure: ESOPHAGOGASTRODUODENOSCOPY (EGD) WITH PROPOFOL;  Surgeon: [name], MD;  [facility]: [facility];  Service: Endoscopy;  Laterality: N/A;  . INSERTION OF DIALYSIS CATHETER Left 07/05/2014   Procedure: INSERTION OF DIALYSIS CATHETER-LEFT INTERNAL JUGULAR PLACEMENT;  Surgeon: Conrad Sugartown, MD;  Location: [place];  Service: Vascular;  Laterality: Left;  . INSERTION OF DIALYSIS CATHETER N/A 04/30/2019   Procedure: INSERTION OF DIALYSIS CATHETER;  Surgeon: Marty Heck, MD;  Location: [facility];  Service: Vascular;  Laterality: N/A;  . LEFT HEART CATH AND CORONARY ANGIOGRAPHY N/A 05/01/2019   Procedure: LEFT HEART CATH AND CORONARY ANGIOGRAPHY;  Surgeon: Martinique, Peter M, MD;  Location: [place] [facility];  Service: Cardiovascular;  Laterality: N/A;  . LUMBAR [place]    . REMOVAL OF A DIALYSIS CATHETER Right 07/05/2014   Procedure: REMOVAL OF A DIALYSIS CATHETER;  Surgeon: Conrad Ames Lake, MD;  Location: [place];  Service: Vascular;  Laterality: Right;  . ULTRASOUND GUIDANCE FOR VASCULAR ACCESS  04/30/2019   Procedure: Ultrasound Guidance For Vascular Access;  Surgeon: Marty Heck, MD;  Location: [place];  Service: Vascular;;    Prior to Admission medications   Medication Sig Start Date End Date Taking? Authorizing Provider  aspirin EC 81 MG tablet Take 81 mg by mouth daily.   Yes [provider]  atorvastatin (LIPITOR) 40 MG tablet Take 1 tablet (40 mg total) by mouth at bedtime.  05/04/19 06/11/19 Yes Ghimire, Dante Gang, MD  B Complex-C-Folic Acid (NEPHRO VITAMINS) 0.8 MG TABS Take 0.8 mg by mouth daily.    Yes [provider]  BRILINTA 90 MG TABS tablet Take 90 mg by mouth 2 (two) times daily. 05/04/19  Yes [provider]  clopidogrel (PLAVIX) 75 MG tablet Take 1 tablet (75 mg total) by mouth daily. 05/04/19  05/03/20 Yes Ghimire, Dante Gang, MD  insulin regular (NOVOLIN R RELION) 100 units/mL injection Inject 0-0.09 mLs (0-9 Units total) into the skin 3 (three) times daily with meals. CBG < 70: implement hypoglycemia protocol CBG 70 - 120: 0 units CBG 121 - 150: 1 unit CBG 151 - 200: 2 units CBG 201 - 250: 3 units CBG 251 - 300: 5 units CBG 301 - 350: 7 units CBG 351 - 400: 9 units CBG > 400: call MD. Patient taking differently: Inject 0-14 Units into the skin 3 (three) times daily with meals. CBG < 70: implement hypoglycemia protocol CBG 70 - 120: 0 units CBG 121 - 150: 4 unit CBG 151 - 200: 6 units CBG 201 - 250: 8 units CBG 251 - 300: 10 units CBG 301 - 350: 12 units CBG 351 - 400: 14 units CBG > 400: call MD. 02/13/18  Yes Hongalgi, Lenis Dickinson, MD  metoprolol tartrate (LOPRESSOR) 25 MG tablet Take 0.5 tablets (12.5 mg total) by mouth 2 (two) times daily. 05/04/19 06/11/19 Yes Ghimire, Dante Gang, MD  pantoprazole (PROTONIX) 40 MG tablet Take 40 mg by mouth 2 (two) times daily.    Yes [provider]  PARoxetine (PAXIL) 10 MG tablet Take 10 mg by mouth daily.   Yes [provider]  sevelamer carbonate (RENVELA) 800 MG tablet Take 800 mg by mouth 3 (three) times daily with meals.   Yes [provider]  HYDROcodone-acetaminophen (NORCO) 5-325 MG tablet Take 1 tablet by mouth every 6 (six) hours as needed for moderate pain. Patient not taking: Reported on 06/11/2019 04/24/19   Dagoberto Ligas, PA-C  ondansetron (ZOFRAN) 4 MG tablet Take 1 tablet (4 mg total) by mouth every 6 (six) hours. Patient not taking: Reported on 06/11/2019 12/22/18   Orpah Greek, MD    Current Facility-Administered Medications  Medication Dose Route Frequency Provider Last Rate Last Dose  . 0.9 %  sodium chloride infusion  250 mL Intravenous PRN Opyd, Ilene Qua, MD      . Derrill Memo ON 06/14/2019] 0.9 %  sodium chloride infusion  100 mL Intravenous PRN Ejigiri, Ogechi Grace, PA-C      . [START ON  06/14/2019] 0.9 %  sodium chloride infusion  100 mL Intravenous PRN Ejigiri, Thomos Lemons, PA-C      . acetaminophen (TYLENOL) tablet 650 mg  650 mg Oral Q4H PRN Opyd, Ilene Qua, MD      . Derrill Memo ON 06/14/2019] alteplase (CATHFLO ACTIVASE) injection 2 mg  2 mg Intracatheter Once PRN Lynnda Child, PA-C      . aspirin chewable tablet 324 mg  324 mg Oral Once Opyd, Ilene Qua, MD      . aspirin EC tablet 81 mg  81 mg Oral Daily Opyd, Ilene Qua, MD   81 mg at 06/12/19 1026  . atorvastatin (LIPITOR) tablet 40 mg  40 mg Oral q1800 Opyd, Ilene Qua, MD   40 mg at 06/12/19 1717  . Chlorhexidine Gluconate Cloth 2 % PADS 6 each  6 each Topical Q0600 Opyd, Ilene Qua, MD   6 each at 06/13/19 [phone]  .  clopidogrel (PLAVIX) tablet 75 mg  75 mg Oral Daily Opyd, Ilene Qua, MD   75 mg at 06/12/19 1026  . doxercalciferol (HECTOROL) 4 MCG/2ML injection           . doxercalciferol (HECTOROL) injection 2 mcg  2 mcg Intravenous Q T,Th,Sa-HD Collins, Samantha G, PA-C      . feeding supplement (NEPRO CARB STEADY) liquid 237 mL  237 mL Oral BID BM Danford, Suann Larry, MD   237 mL at 06/12/19 1717  . [START ON 06/14/2019] heparin injection 1,000 Units  1,000 Units Dialysis PRN Lynnda Child, PA-C   5,000 Units at 06/13/19 [id number]  . [START ON 06/14/2019] heparin injection 1,600 Units  20 Units/kg Dialysis PRN Lynnda Child, PA-C      . heparin injection 5,000 Units  5,000 Units Subcutaneous Q8H Opyd, Ilene Qua, MD   5,000 Units at 06/13/19 [phone]  . insulin aspart (novoLOG) injection 0-9 Units  0-9 Units Subcutaneous Q4H Opyd, Ilene Qua, MD   1 Units at 06/12/19 1717  . [START ON 06/14/2019] lidocaine (PF) (XYLOCAINE) 1 % injection 5 mL  5 mL Intradermal PRN Lynnda Child, PA-C      . Derrill Memo ON 06/14/2019] lidocaine-prilocaine (EMLA) cream 1 application  1 application Topical PRN Lynnda Child, PA-C      . metoprolol tartrate (LOPRESSOR) tablet 12.5 mg  12.5 mg Oral BID Opyd, Ilene Qua, MD   12.5  mg at 06/12/19 2154  . nitroGLYCERIN (NITROSTAT) SL tablet 0.4 mg  0.4 mg Sublingual Q5 Min x 3 PRN Opyd, Ilene Qua, MD      . ondansetron (ZOFRAN) injection 4 mg  4 mg Intravenous Q6H PRN Opyd, Ilene Qua, MD      . pantoprazole (PROTONIX) EC tablet 40 mg  40 mg Oral BID Opyd, Ilene Qua, MD   40 mg at 06/12/19 2154  . PARoxetine (PAXIL) tablet 10 mg  10 mg Oral Daily Opyd, Ilene Qua, MD   10 mg at 06/12/19 1026  . [START ON 06/14/2019] pentafluoroprop-tetrafluoroeth (GEBAUERS) aerosol 1 application  1 application Topical PRN Lynnda Child, PA-C      . sevelamer carbonate (RENVELA) tablet 2,400 mg  2,400 mg Oral TID WC Collins, Samantha G, PA-C   2,400 mg at 06/12/19 1200  . sodium chloride flush (NS) 0.9 % injection 3 mL  3 mL Intravenous Q12H Opyd, Ilene Qua, MD   3 mL at 06/12/19 2155  . sodium chloride flush (NS) 0.9 % injection 3 mL  3 mL Intravenous PRN Opyd, Ilene Qua, MD        Allergies as of 06/09/2019  . (No Known Allergies)    Family History  Problem Relation Age of Onset  . Diabetes Father   . Heart attack Father     Social History   Socioeconomic History  . Marital status: Single    Spouse name: Not on file  . Number of children: Not on file  . Years of education: Not on file  . Highest education level: Not on file  Occupational History  . Not on file  Social Needs  . Financial resource strain: Not on file  . Food insecurity    Worry: Not on file    Inability: Not on file  . Transportation needs    Medical: Not on file    Non-medical: Not on file  Tobacco Use  . Smoking status: Former Smoker    Packs/day: 0.50    Years: 5.00  Pack years: 2.50    Types: Cigarettes    Quit date: 08/24/1979    Years since quitting: 39.8  . Smokeless tobacco: Never Used  Substance and Sexual Activity  . Alcohol use: Not Currently    Alcohol/week: 0.0 standard drinks    Comment: 02/08/2018 "stopped in my 50's"  . Drug use: Never  . Sexual activity: Not Currently   Lifestyle  . Physical activity    Days per week: Not on file    Minutes per session: Not on file  . Stress: Not on file  Relationships  . Social Herbalist on phone: Not on file    Gets together: Not on file    Attends religious service: Not on file    Active member of club or organization: Not on file    Attends meetings of clubs or organizations: Not on file    Relationship status: Not on file  . Intimate partner violence    Fear of current or ex partner: Not on file    Emotionally abused: Not on file    Physically abused: Not on file    Forced sexual activity: Not on file  Other Topics Concern  . Not on file  Social History Narrative  . Not on file    Review of Systems: As per HPI, all others negative  Physical Exam: Vital signs in last 24 hours: Temp:  [97.6 F (36.4 C)-98.4 F (36.9 C)] 97.6 F (36.4 C) (09/30 0730) Pulse Rate:  [61-68] 67 (09/30 1030) Resp:  [16-20] 16 (09/30 0730) BP: (128-154)/(49-74) 132/64 (09/30 1030) SpO2:  [92 %-97 %] 92 % (09/30 0730) Weight:  [80.4 kg] 80.4 kg (09/30 0730) Last BM Date: 06/12/19 General:   Alert,  Deconditioned- and weak-appearing, cooperative in NAD Head:  Normocephalic and atraumatic. Eyes:  Sclera clear, no icterus.   Conjunctiva pink. Ears:  Normal auditory acuity. Nose:  No deformity, discharge,  or lesions. Mouth:  No deformity or lesions.  Oropharynx pink & moist. Neck:  Supple; no masses or thyromegaly. Lungs:  Dyspneic with minimal exertion; Right upper anterior chest Port-a-Cath; Clear throughout to auscultation.   No wheezes, crackles, or rhonchi. No acute distress. Heart:  Regular rate and rhythm; no murmurs, clicks, rubs,  or gallops. Abdomen:  Soft, nontender and nondistended. No masses, hepatosplenomegaly or hernias noted. Normal bowel sounds, without guarding, and without rebound.     Msk:  Symmetrical without gross deformities. Normal posture. Pulses:  Normal pulses noted. Extremities:  RUE  fistula not working; Without clubbing or edema. Neurologic:  Alert and  oriented x4;  grossly normal neurologically. Skin:  Intact without significant lesions or rashes. Psych:  Alert and cooperative. Normal mood and affect.   Lab Results: Recent Labs    06/12/19 0354 06/13/19 0757  WBC 7.8 7.5  HGB 9.9* 9.2*  HCT 31.8* 29.0*  PLT 179 158   BMET Recent Labs    06/12/19 0354 06/13/19 0757  NA 138 135  K 4.3 4.6  CL 100 98  CO2 25 22  GLUCOSE 162* 134*  BUN 64* 80*  CREATININE 11.08* 11.73*  CALCIUM 8.9 8.7*   LFT Recent Labs    06/12/19 0354 06/13/19 0757  PROT 6.8  --   ALBUMIN 2.9* 2.8*  AST 22  --   ALT 24  --   ALKPHOS 119  --   BILITOT 0.5  --    PT/INR No results for input(s): LABPROT, INR in the last 72 hours.  Studies/Results: Ct Abdomen Pelvis W Contrast  Result Date: 06/12/2019 CLINICAL DATA:  73 year old male with unexplained weight loss and abdominal pain. EXAM: CT ABDOMEN AND PELVIS WITH CONTRAST TECHNIQUE: Multidetector CT imaging of the abdomen and pelvis was performed using the standard protocol following bolus administration of intravenous contrast. CONTRAST:  126mL OMNIPAQUE IOHEXOL 300 MG/ML  SOLN COMPARISON:  12/22/2018 CT FINDINGS: Lower chest: Chronic interstitial changes within both lung bases again noted. Cardiomegaly again identified. Hepatobiliary: The liver and gallbladder are unremarkable. No biliary dilatation. Pancreas: Unremarkable Spleen: Unremarkable Adrenals/Urinary Tract: Bilateral renal atrophy and small indeterminate low-density renal masses are unchanged. No evidence of hydronephrosis or obstructing urinary calculi. The adrenal glands are unremarkable. Mild circumferential bladder wall thickening again noted. Stomach/Bowel: Stomach is within normal limits. Appendix appears normal. No evidence of bowel wall thickening, distention, or inflammatory changes. Vascular/Lymphatic: Aortic atherosclerosis. No enlarged abdominal or pelvic  lymph nodes. Reproductive: Mild prostate enlargement again noted. Other: No ascites, focal collection or pneumoperitoneum. Musculoskeletal: No acute or suspicious bony abnormalities. Moderate to severe degenerative disc disease, spondylosis and broad-based disc bulge at L5-S1 noted. IMPRESSION: 1. No evidence of acute abnormality. 2. Bilateral renal atrophy and small unchanged renal lesions, likely cysts. 3. Mild prostate enlargement and mild circumferential bladder wall thickening again noted. 4. Cardiomegaly with chronic interstitial lung disease/fibrosis. 5.  Aortic Atherosclerosis (ICD10-I70.0). Electronically Signed   By: Margarette Canada M.D.   On: 06/12/2019 16:35    Impression:  1.  Weight loss.  30+ lbs over 6 months. Suggestion of esophagitis on CT April, but yesterday's CT showed no mass or other GI tract explanation for weight loss. 2.  Fatigue and weakness; ongoing for about 6 months. 3.  Recent NSTEMI in August 2020, with stents placed. 4.  Anemia, acute on chronic.  No evidence of overt GI bleeding. 5.  Chronic anticoagulation, clopidigrel.  Plan:  1.  Given patient's recent NSTEMI and cardiac stents, and reassuring CT scan, I would be reluctant to pursue further GI work-up (which would include endoscopy and colonoscopy) at the present time. 2.  I would continue PPI, and would consider gastroparesis type diet (small, frequent meals; avoid/minimize fibrous breads/meats/vegatables) now and upon hospital discharge. 3.  Would have patient follow-up with Dr. Dalene Carrow [facility] as outpatient (they saw patient in office few years ago); I saw patient today since I had already directed some of patient's care plan without knowing that patient had been seen by [facility] in past. 4.  We will sign-off; please call with questions; thank you for the consultation.   LOS: 3 days   Alaylah Heatherington M  06/13/2019, 10:37 AM  Cell [phone] If no answer or after 5 PM call [phone]

## 2019-06-13 NOTE — Plan of Care (Signed)
  Problem: Clinical Measurements: Goal: Respiratory complications will improve Outcome: Progressing Note: No s/s of respiratory complications noted.  Stable on room air. Goal: Cardiovascular complication will be avoided Outcome: Progressing Note: No s/s of cardiovascular complication noted.  Patient is non-monitored.

## 2019-06-13 NOTE — Progress Notes (Signed)
Physical Therapy Treatment Patient Details Name: Blake [name] MRN: [id number] DOB: 01-Jan-1946 Today's Date: 06/13/2019    History of Present Illness 73 year old gentleman was admitted from home with missed HD session was found to have chest pain and elevated troponin, hypoxia.  Resolved pulm edema and hypoxia, with HD session.  Recently no chest pain despite several incidents.  PMHx:  ESRD, DM, HTN, HLD, osteomyelitis, R toe amp, NSTEMI, stent placement, heart cath Aug 2020    PT Comments    Pt asleep on entry, however easily roused but reports continuing fatigue which limits his ability to mobilize. Pt is limited by generalized weakness and decreased endurance. Pt currently requires min guard for bed mobility and min A for sit>stand. Pt stood for 45 sec before having to sit due to fatigue, able to stand again and perform marching in place for 30 sec before requesting to return to bed. Pt unable to progress to ambulation due to weakness. Discussed with pt need to go to SNF level rehab due to his inability to safely mobilize. Pt agreeable to SNF placement. PT will continue to follow acutely.     Follow Up Recommendations  SNF     Equipment Recommendations  None recommended by PT       Precautions / Restrictions Precautions Precautions: Fall Restrictions Weight Bearing Restrictions: No    Mobility  Bed Mobility Overal bed mobility: Needs Assistance Bed Mobility: Supine to Sit;Sit to Supine     Supine to sit: Min guard Sit to supine: Min guard   General bed mobility comments: min guard for safety, increased time and effort with heavy use of bedrail   Transfers Overall transfer level: Needs assistance Equipment used: Rolling walker (2 wheeled) Transfers: Sit to/from Stand Sit to Stand: Min assist         General transfer comment: minA for power up and steadying in RW, pt able to stand for 45 sec before sitting due to fatigue, able to stand a second time with marching  in place for 30 second before asking to return to bed  Ambulation/Gait             General Gait Details: deferred due to weakness          Balance Overall balance assessment: Needs assistance Sitting-balance support: Feet supported Sitting balance-Leahy Scale: Good     Standing balance support: Bilateral upper extremity supported;During functional activity Standing balance-Leahy Scale: Fair Standing balance comment: less than fair without walker                            Cognition Arousal/Alertness: Awake/alert Behavior During Therapy: WFL for tasks assessed/performed Overall Cognitive Status: Within Functional Limits for tasks assessed                                           General Comments General comments (skin integrity, edema, etc.): VSS, pt with complaints of increasing fatigue over the last 6 months       Pertinent Vitals/Pain Pain Assessment: Faces Faces Pain Scale: Hurts a little bit Pain Location: generalized Pain Descriptors / Indicators: Grimacing Pain Intervention(s): Limited activity within patient's tolerance;Monitored during session;Repositioned           PT Goals (current goals can now be found in the care plan section) Acute Rehab PT Goals Patient Stated Goal: feel better PT  Goal Formulation: With patient Time For Goal Achievement: 06/24/19 Potential to Achieve Goals: Good Progress towards PT goals: Not progressing toward goals - comment    Frequency    Min 2X/week      PT Plan Discharge plan needs to be updated       AM-PAC PT "6 Clicks" Mobility   Outcome Measure  Help needed turning from your back to your side while in a flat bed without using bedrails?: None Help needed moving from lying on your back to sitting on the side of a flat bed without using bedrails?: None Help needed moving to and from a bed to a chair (including a wheelchair)?: A Little Help needed standing up from a chair using  your arms (e.g., wheelchair or bedside chair)?: A Little Help needed to walk in hospital room?: A Lot Help needed climbing 3-5 steps with a railing? : Total 6 Click Score: 17    End of Session Equipment Utilized During Treatment: Gait belt Activity Tolerance: Treatment limited secondary to medical complications (Comment) Patient left: in chair;with call bell/phone within reach;with chair alarm set Nurse Communication: Mobility status PT Visit Diagnosis: Unsteadiness on feet (R26.81);Muscle weakness (generalized) (M62.81);Difficulty in walking, not elsewhere classified (R26.2);Dizziness and giddiness (R42)     Time: [id number] PT Time Calculation (min) (ACUTE ONLY): 13 min  Charges:  $Therapeutic Activity: 8-22 mins                     Alexiya Franqui B. Migdalia Dk PT, DPT Acute Rehabilitation Services Pager [phone] Office [phone]    [place] 06/13/2019, 4:47 PM

## 2019-06-13 NOTE — TOC Progression Note (Signed)
Transition of Care [facility]) - Progression Note    Patient Details  Name: Blake [name] MRN: [id number] Date of Birth: 03-07-1946  [facility]) CM/SW Contact  Graves-Bigelow, Ocie Cornfield, RN Phone Number: 06/13/2019, 9:11 AM  Clinical Narrative: CM re-faxed the patient out to other SNF- [facility] is not able to get the patient to his HD center on scheduled days. CM did work with Jaclyn Shaggy- renal navigator to see if she could assist. Patient is not willing to change HD centers, so CM will see if any other facility can work with his HD times and center. CM will continue to follow for additional transition of care needs.      Expected Discharge Plan: Home/Self Care Barriers to Discharge: No Barriers Identified  Expected Discharge Plan and Services Expected Discharge Plan: Home/Self Care In-house Referral: NA Discharge Planning Services: CM Consult Post Acute Care Choice: NA Living arrangements for the past 2 months: Single Family Home                 HH Arranged: Refused HH(Case Manager did state to call PCP if needs assistance in the future.)    Social Determinants of Health (SDOH) Interventions    Readmission Risk Interventions Readmission Risk Prevention Plan 06/11/2019  Transportation Screening Complete  HRI or [place] Complete  Social Work Consult for [place] Planning/Counseling Complete  Palliative Care Screening Not Applicable  Medication Review Press photographer) Complete  Some recent data might be hidden

## 2019-06-13 NOTE — TOC Progression Note (Signed)
Transition of Care [facility]) - Progression Note    Patient Details  Name: OLNEY LAFAZIA MRN: [id number] Date of Birth: 1946-05-27  Transition of Care [facility]) CM/SW Contact  Graves-Bigelow, Ocie Cornfield, RN Phone Number: 06/13/2019, 4:33 PM  Clinical Narrative:   CM faxed out to additional facilities to see if they can accommodate patient's HD schedule. CM will follow up in am with facilities that were faxed out.   Expected Discharge Plan: Home/Self Care Barriers to Discharge: No Barriers Identified  Expected Discharge Plan and Services Expected Discharge Plan: Home/Self Care In-house Referral: NA Discharge Planning Services: CM Consult Post Acute Care Choice: NA Living arrangements for the past 2 months: Single Family Home                  HH Arranged: Refused HH(Case Manager did state to call PCP if needs assistance in the future.)     Social Determinants of Health (SDOH) Interventions    Readmission Risk Interventions Readmission Risk Prevention Plan 06/11/2019  Transportation Screening Complete  HRI or [place] Complete  Social Work Consult for [place] Planning/Counseling Complete  Palliative Care Screening Not Applicable  Medication Review Press photographer) Complete  Some recent data might be hidden

## 2019-06-14 LAB — GLUCOSE, CAPILLARY
Glucose-Capillary: 95 mg/dL (ref 70–99)
Glucose-Capillary: 137 mg/dL — ABNORMAL HIGH (ref 70–99)
Glucose-Capillary: 96 mg/dL (ref 70–99)
Glucose-Capillary: 160 mg/dL — ABNORMAL HIGH (ref 70–99)
Glucose-Capillary: 174 mg/dL — ABNORMAL HIGH (ref 70–99)
Glucose-Capillary: 116 mg/dL — ABNORMAL HIGH (ref 70–99)

## 2019-06-14 MED ORDER — HEPARIN SODIUM (PORCINE) 1000 UNIT/ML DIALYSIS: 6000.0000 [IU] | Freq: Once | INTRAMUSCULAR | Status: DC

## 2019-06-14 MED ORDER — DOXERCALCIFEROL 4 MCG/2ML IV SOLN
INTRAVENOUS | Status: AC
  Administered 2019-06-14: 2 ug via INTRAVENOUS
  Filled 2019-06-14: qty 2

## 2019-06-14 MED ORDER — HEPARIN SODIUM (PORCINE) 1000 UNIT/ML IJ SOLN
INTRAMUSCULAR | Status: AC
  Administered 2019-06-14: 3000 [IU] via INTRAVENOUS_CENTRAL
  Filled 2019-06-14: qty 4

## 2019-06-14 NOTE — Plan of Care (Signed)
  Problem: Education: Goal: Knowledge of General Education information will improve Description Including pain rating scale, medication(s)/side effects and non-pharmacologic comfort measures Outcome: Progressing   

## 2019-06-14 NOTE — TOC Progression Note (Signed)
Transition of Care [facility]) - Progression Note    Patient Details  Name: Blake [name] MRN: [id number] Date of Birth: 04-10-1946  Transition of Care [facility]) CM/SW Contact  Graves-Bigelow, Ocie Cornfield, RN Phone Number: 06/14/2019, 12:35 PM  Clinical Narrative: [name] accepted the patient and can transport to HD center for chair time. Next the facility has to submit for authorization. Per Liaison she does not know how long it will take for authorization. CM did speak with patient and niece regarding change in facility and both are agreeable as long as he can stay at his HD center. Plan to await for authorization. Patient will need a rapid COVID test within 48 hours of transition to facility. CM did leave a voicemail for Olivia Mackie to make her aware that patient is agreeable and to start auth. Pt will need PTAR for transport to [facility]. CM will follow for additional transition of care needs.   Expected Discharge Plan: Skilled Nursing Facility Barriers to Discharge: No Barriers Identified  Expected Discharge Plan and Services Expected Discharge Plan: [place] In-house Referral: NA Discharge Planning Services: CM Consult Post Acute Care Choice: [place] Living arrangements for the past 2 months: Single Family Home                           HH Arranged: Refused HH(Case Manager did state to call PCP if needs assistance in the future.)           Social Determinants of Health (SDOH) Interventions    Readmission Risk Interventions Readmission Risk Prevention Plan 06/11/2019  Transportation Screening Complete  HRI or Home Care Consult Complete  Social Work Consult for [place] Planning/Counseling Complete  Palliative Care Screening Not Applicable  Medication Review Press photographer) Complete  Some recent data might be hidden

## 2019-06-14 NOTE — Progress Notes (Signed)
PROGRESS NOTE        PATIENT DETAILS Name: Blake [name] Age: 73 y.o. Sex: male Date of Birth: [birth year] Admit Date: 06/09/2019 Admitting Physician Vianne Bulls, MD [id number], Mauro Kaufmann, MD  Brief Narrative: Patient is a 73 y.o. male with history of ESRD on HD TTS, CAD s/p PCI 1 month back, HTN, IDDM-who presented with fatigue and atypical chest pain.  Subjective: Seen earlier at HD-denies any chest pain or shortness of breath.    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   Chest pain: Felt to be atypical-could be related to GI issues-esophagitis-continue PPI.  Has history of CAD-see below  History of CAD-with recent non-STEMI in August 2020 requiring DES to circumflex and LAD: No anginal symptoms-denies any chest pain or shortness of breath this morning.  Remains on dual antiplatelet agents with aspirin/Plavix along with beta-blocker and statin.  ESRD on HD TTS: Nephrology following for HD care.  DM-2: CBG stable-continue with SSI  Anemia: Likely related to ESRD-stable-continue to follow closely.  GERD/esophagitis: Continue PPI.  Unstageable pressure injury left lateral ankle, POA  Debility/deconditioning: Per patient at baseline-he uses a cane to get around the house.  He also has a wheelchair and a walker.  Suspect worsening of his deconditioning is due to weight loss/acute illness-spoke with case management/social work today-they are working on [employer] placement.  Diet: Diet Order            Diet renal/carb modified with fluid restriction Diet-HS Snack? Nothing; Fluid restriction: 1200 mL Fluid; Room service appropriate? Yes; Fluid consistency: Thin  Diet effective now               DVT  Prophylaxis: Prophylactic Heparin   Code Status: Full code   Family Communication: None at bedside  Disposition Plan: Remain inpatient- SNF on discharge-SW following  Antimicrobial agents: Anti-infectives (From admission, onward)   None      Procedures: None CONSULTS:  GI and nephrology  Time spent: 25- minutes-Greater than 50% of this time was spent in counseling, explanation of diagnosis, planning of further management, and coordination of care.  MEDICATIONS: Scheduled Meds:  aspirin  324 mg Oral Once   aspirin EC  81 mg Oral Daily   atorvastatin  40 mg Oral q1800   Chlorhexidine Gluconate Cloth  6 each Topical Q0600   Chlorhexidine Gluconate Cloth  6 each Topical Q0600   clopidogrel  75 mg Oral Daily   doxercalciferol  2 mcg Intravenous Q T,Th,Sa-HD   feeding supplement (NEPRO CARB STEADY)  237 mL Oral BID BM   heparin  5,000 Units Subcutaneous Q8H   insulin aspart  0-9 Units Subcutaneous Q4H   metoprolol tartrate  12.5 mg Oral BID   pantoprazole  40 mg Oral BID   PARoxetine  10 mg Oral Daily   sevelamer carbonate  2,400 mg Oral TID WC   sodium chloride flush  3 mL Intravenous Q12H   Continuous Infusions:  sodium chloride     PRN Meds:.sodium chloride, acetaminophen, nitroGLYCERIN, ondansetron (ZOFRAN) IV, sodium chloride flush   PHYSICAL EXAM: Vital signs: Vitals:   06/14/19 0931 06/14/19 1006 06/14/19 1030 06/14/19 1100  BP: (!) 107/55 (!) 100/51 Marland Kitchen)  94/34 (!) 102/57  Pulse: 64 61 62 (!) 57  Resp:    13  Temp:    (!) 97.4 F (36.3 C)  TempSrc:    Axillary  SpO2:    96%  Weight:    76.3 kg  Height:       Filed Weights   06/14/19 0408 06/14/19 0721 06/14/19 1100  Weight: 78.1 kg 79.2 kg 76.3 kg   Body mass index is 25.58 kg/m.   Gen Exam:Alert awake-not in any distress HEENT:atraumatic, normocephalic Chest: B/L clear to auscultation anteriorly CVS:S1S2 regular Abdomen:soft non tender, non distended Extremities:no  edema Neurology: Non focal Skin: no rash  I have personally reviewed following labs and imaging studies  LABORATORY DATA: CBC: Recent Labs  Lab 06/09/19 0650 06/10/19 0237 06/12/19 0354 06/13/19 0757  WBC 9.2 6.6 7.8 7.5  NEUTROABS 7.6  --  4.7  --   HGB 10.0* 9.7* 9.9* 9.2*  HCT 31.7* 30.1* 31.8*   31.5* 29.0*  MCV 92.7 90.4 91.6 91.5  PLT 167 157 179 0000000    Basic Metabolic Panel: Recent Labs  Lab 06/09/19 0650 06/09/19 2130 06/10/19 0237 06/12/19 0354 06/13/19 0757  NA 141 138 140 138 135  K 3.7 3.4* 3.1* 4.3 4.6  CL 103 98 99 100 98  CO2 21* 26 28 25 22   GLUCOSE 200* 140* 94 162* 134*  BUN 65* 31* 33* 64* 80*  CREATININE 11.61* 7.04* 7.59* 11.08* 11.73*  CALCIUM 9.0 8.8* 9.0 8.9 8.7*  PHOS  --   --   --   --  2.4*    GFR: Estimated Creatinine Clearance: 5.6 mL/min (A) (by C-G formula based on SCr of 11.73 mg/dL (H)).  Liver Function Tests: Recent Labs  Lab 06/09/19 2130 06/12/19 0354 06/13/19 0757  AST 44* 22  --   ALT 34 24  --   ALKPHOS 126 119  --   BILITOT 0.9 0.5  --   PROT 7.4 6.8  --   ALBUMIN 3.1* 2.9* 2.8*   No results for input(s): LIPASE, AMYLASE in the last 168 hours. No results for input(s): AMMONIA in the last 168 hours.  Coagulation Profile: No results for input(s): INR, PROTIME in the last 168 hours.  Cardiac Enzymes: Recent Labs  Lab 06/12/19 0354  CKTOTAL 40*    BNP (last 3 results) No results for input(s): PROBNP in the last 8760 hours.  HbA1C: No results for input(s): HGBA1C in the last 72 hours.  CBG: Recent Labs  Lab 06/13/19 1651 06/13/19 2115 06/14/19 0404 06/14/19 0620 06/14/19 1216  GLUCAP 110* 118* 95 137* 96    Lipid Profile: No results for input(s): CHOL, HDL, LDLCALC, TRIG, CHOLHDL, LDLDIRECT in the last 72 hours.  Thyroid Function Tests: No results for input(s): TSH, T4TOTAL, FREET4, T3FREE, THYROIDAB in the last 72 hours.  Anemia Panel: Recent Labs    06/12/19 0354  VITAMINB12 1,264*     Urine analysis:    Component Value Date/Time   COLORURINE YELLOW 08/01/2015 0642   APPEARANCEUR CLEAR 08/01/2015 0642   LABSPEC 1.013 08/01/2015 0642   PHURINE 8.0 08/01/2015 0642   GLUCOSEU 100 (A) 08/01/2015 0642   HGBUR MODERATE (A) 08/01/2015 0642   BILIRUBINUR NEGATIVE 08/01/2015 0642   KETONESUR NEGATIVE 08/01/2015 0642   PROTEINUR 100 (A) 08/01/2015 0642   UROBILINOGEN 0.2 06/28/2014 1417   NITRITE NEGATIVE 08/01/2015 0642   LEUKOCYTESUR NEGATIVE 08/01/2015 0642    Sepsis Labs: Lactic Acid, Venous    Component Value Date/Time  LATICACIDVEN 1.5 12/09/2018 [id number]    MICROBIOLOGY: Recent Results (from the past 240 hour(s))  SARS CORONAVIRUS 2 (TAT 6-24 HRS) Nasopharyngeal Nasopharyngeal Swab     Status: None   Collection Time: 06/09/19  8:24 AM   Specimen: Nasopharyngeal Swab  Result Value Ref Range Status   SARS Coronavirus 2 NEGATIVE NEGATIVE Final    Comment: (NOTE) SARS-CoV-2 target nucleic acids are NOT DETECTED. The SARS-CoV-2 RNA is generally detectable in upper and lower respiratory specimens during the acute phase of infection. Negative results do not preclude SARS-CoV-2 infection, do not rule out co-infections with other pathogens, and should not be used as the sole basis for treatment or other patient management decisions. Negative results must be combined with clinical observations, patient history, and epidemiological information. The expected result is Negative. Fact Sheet for Patients: SugarRoll.be Fact Sheet for Healthcare Providers: https://www.woods-mathews.com/ This test is not yet approved or cleared by the [place] FDA and  has been authorized for detection and/or diagnosis of SARS-CoV-2 by FDA under an Emergency Use Authorization (EUA). This EUA will remain  in effect (meaning this test can be used) for the duration of the COVID-19 declaration under Section 56 4(b)(1) of the Act, 21  U.S.C. section 360bbb-3(b)(1), unless the authorization is terminated or revoked sooner. Performed at [facility], [place] [address], [place], [place] [address]     RADIOLOGY STUDIES/RESULTS: Ct Angio Chest Pe W Or Wo Contrast  Result Date: 06/10/2019 CLINICAL DATA:  Shortness of breath and edema. History of end-stage renal disease. EXAM: CT ANGIOGRAPHY CHEST WITH CONTRAST TECHNIQUE: Multidetector CT imaging of the chest was performed using the standard protocol during bolus administration of intravenous contrast. Multiplanar CT image reconstructions and MIPs were obtained to evaluate the vascular anatomy. CONTRAST:  77mL OMNIPAQUE IOHEXOL 350 MG/ML SOLN COMPARISON:  04/29/2019 FINDINGS: Cardiovascular: The pulmonary arteries are adequately opacified. There is no evidence of pulmonary embolism. The heart size is stable and at the upper limits of normal. Coronary stent present. No pericardial fluid identified. The thoracic aorta is normal in caliber. Mediastinum/Nodes: Stable mildly enlarged mediastinal lymph nodes and enlarged right hilar lymph nodes. Lungs/Pleura: Stable chronic lung disease with more prominent disease in the right lung compared to the left. It would be difficult to exclude subtle areas infection but overall appearance of both lungs appears very similar since the prior CTA. Probable component of mild chronic pulmonary interstitial edema. No pleural effusions. No pneumothorax. No pulmonary masses. Upper Abdomen: No acute abnormality. Musculoskeletal: No chest wall abnormality. No acute or significant osseous findings. Review of the MIP images confirms the above findings. IMPRESSION: 1. No evidence of pulmonary embolism. 2. Stable mildly enlarged mediastinal lymph nodes and enlarged right hilar lymph nodes. 3. Stable chronic lung disease, right greater than left. There may be a component of mild chronic pulmonary interstitial edema. Electronically Signed   By: Aletta Edouard M.D.    On: 06/10/2019 16:05   Ct Abdomen Pelvis W Contrast  Result Date: 06/12/2019 CLINICAL DATA:  74 year old male with unexplained weight loss and abdominal pain. EXAM: CT ABDOMEN AND PELVIS WITH CONTRAST TECHNIQUE: Multidetector CT imaging of the abdomen and pelvis was performed using the standard protocol following bolus administration of intravenous contrast. CONTRAST:  179mL OMNIPAQUE IOHEXOL 300 MG/ML  SOLN COMPARISON:  12/22/2018 CT FINDINGS: Lower chest: Chronic interstitial changes within both lung bases again noted. Cardiomegaly again identified. Hepatobiliary: The liver and gallbladder are unremarkable. No biliary dilatation. Pancreas: Unremarkable Spleen: Unremarkable Adrenals/Urinary Tract: Bilateral renal atrophy  and small indeterminate low-density renal masses are unchanged. No evidence of hydronephrosis or obstructing urinary calculi. The adrenal glands are unremarkable. Mild circumferential bladder wall thickening again noted. Stomach/Bowel: Stomach is within normal limits. Appendix appears normal. No evidence of bowel wall thickening, distention, or inflammatory changes. Vascular/Lymphatic: Aortic atherosclerosis. No enlarged abdominal or pelvic lymph nodes. Reproductive: Mild prostate enlargement again noted. Other: No ascites, focal collection or pneumoperitoneum. Musculoskeletal: No acute or suspicious bony abnormalities. Moderate to severe degenerative disc disease, spondylosis and broad-based disc bulge at L5-S1 noted. IMPRESSION: 1. No evidence of acute abnormality. 2. Bilateral renal atrophy and small unchanged renal lesions, likely cysts. 3. Mild prostate enlargement and mild circumferential bladder wall thickening again noted. 4. Cardiomegaly with chronic interstitial lung disease/fibrosis. 5.  Aortic Atherosclerosis (ICD10-I70.0). Electronically Signed   By: Margarette Canada M.D.   On: 06/12/2019 16:35   Dg Chest Portable 1 View  Result Date: 06/09/2019 CLINICAL DATA:  Shortness of  breath. EXAM: PORTABLE CHEST 1 VIEW COMPARISON:  05/03/2019 FINDINGS: 0644 hours. The cardio pericardial silhouette is enlarged. Diffuse interstitial and central alveolar opacity again noted in both lungs. No substantial pleural effusion. Right IJ dialysis catheter is stable. The visualized bony structures of the thorax are intact. Insert wire IMPRESSION: Stable exam. Cardiomegaly with diffuse interstitial and central alveolar opacity bilaterally. Imaging features may reflect pulmonary edema although underlying chronic infectious/inflammatory etiology not excluded. Electronically Signed   By: Misty Stanley M.D.   On: 06/09/2019 07:45     LOS: 4 days   Oren Binet, MD  [facility]  If 7PM-7AM, please contact night-coverage  Please page via [email or web address]  Go to [email or web address] and use [facility]'s universal password to access. If you do not have the password, please contact the hospital operator.  Locate the [facility] provider you are looking for under [facility] and page to a number that you can be directly reached. If you still have difficulty reaching the provider, please page the [facility] (Director on Call) for the Hospitalists listed on amion for assistance.  06/14/2019, 12:36 PM

## 2019-06-14 NOTE — Progress Notes (Signed)
[facility] Progress Note   Subjective:  Seen on HD. No c/o.    Objective Vitals:   06/14/19 0931 06/14/19 1006 06/14/19 1030 06/14/19 1100  BP: (!) 107/55 (!) 100/51 (!) 94/34 (!) 102/57  Pulse: 64 61 62 (!) 57  Resp:    13  Temp:    (!) 97.4 F (36.3 C)  TempSrc:    Axillary  SpO2:    96%  Weight:    76.3 kg  Height:        Physical Exam General: WNWD male, fatigued  Heart: RRR Lungs: CTAB  Abdomen: soft NTND Extremities: No LE edema  Dialysis Access: R IJ TDC dsg clean    Weight change: 0 kg   Additional Objective Labs: Basic Metabolic Panel: Recent Labs  Lab 06/10/19 0237 06/12/19 0354 06/13/19 0757  NA 140 138 135  K 3.1* 4.3 4.6  CL 99 100 98  CO2 28 25 22   GLUCOSE 94 162* 134*  BUN 33* 64* 80*  CREATININE 7.59* 11.08* 11.73*  CALCIUM 9.0 8.9 8.7*  PHOS  --   --  2.4*   CBC: Recent Labs  Lab 06/09/19 0650 06/10/19 0237 06/12/19 0354 06/13/19 0757  WBC 9.2 6.6 7.8 7.5  NEUTROABS 7.6  --  4.7  --   HGB 10.0* 9.7* 9.9* 9.2*  HCT 31.7* 30.1* 31.8*  31.5* 29.0*  MCV 92.7 90.4 91.6 91.5  PLT 167 157 179 158   Blood Culture    Component Value Date/Time   SDES BLOOD LEFT HAND 04/30/2019 2058   SPECREQUEST  04/30/2019 2058    BOTTLES DRAWN AEROBIC ONLY Blood Culture results may not be optimal due to an inadequate volume of blood received in culture bottles   CULT  04/30/2019 2058    NO GROWTH 5 DAYS Performed at [facility], [address], [place], [place] [address]    REPTSTATUS 05/05/2019 FINAL 04/30/2019 2058     Medications: . sodium chloride     . aspirin  324 mg Oral Once  . aspirin EC  81 mg Oral Daily  . atorvastatin  40 mg Oral q1800  . Chlorhexidine Gluconate Cloth  6 each Topical Q0600  . Chlorhexidine Gluconate Cloth  6 each Topical Q0600  . clopidogrel  75 mg Oral Daily  . doxercalciferol  2 mcg Intravenous Q T,Th,Sa-HD  . feeding supplement (NEPRO CARB STEADY)  237 mL Oral BID BM  . heparin   5,000 Units Subcutaneous Q8H  . [START ON 06/15/2019] heparin  6,000 Units Dialysis Once in dialysis  . insulin aspart  0-9 Units Subcutaneous Q4H  . metoprolol tartrate  12.5 mg Oral BID  . pantoprazole  40 mg Oral BID  . PARoxetine  10 mg Oral Daily  . sevelamer carbonate  2,400 mg Oral TID WC  . sodium chloride flush  3 mL Intravenous Q12H   Dialysis: [facility]   4h  400/800   81kg  2K/3.5Ca bath  Hep 6000  R IJ TDC  Mircera 30   Hectorol 2   Renvela 800 mg 3 tabs TID   Midodrine 10mg  before dialysis  Assessment/Plan: 1. Shortness of breath/pulm edema: due to vol overload.  Resolved.  2. Weakness/debility/ wt loss - PT rec's SNF, SW working on this 3. ESRD:  HD TTS. HD today to get back on schedule.  4. Hypertension/volume: Takes midodrine before dialysis as OP. UF 3kg today and possibly lower 76- 77kg at dc.  5. Anemia: Hemoglobin 9.9 on  mircera as outpatient. Hb 9's here, stable, no esa given here yet 6. Metabolic bone disease: Corr Calcium 9.7. Continue hectorol and renvela.  7. CAD s/p DES x 2 - Continues aspirin, Plavix, metoprolol, statin.   Kelly Splinter, MD 06/14/2019, 11:30 AM

## 2019-06-15 DIAGNOSIS — N2581 Secondary hyperparathyroidism of renal origin: Secondary | ICD-10-CM | POA: Diagnosis not present

## 2019-06-15 DIAGNOSIS — E118 Type 2 diabetes mellitus with unspecified complications: Secondary | ICD-10-CM | POA: Diagnosis not present

## 2019-06-15 DIAGNOSIS — D509 Iron deficiency anemia, unspecified: Secondary | ICD-10-CM | POA: Diagnosis not present

## 2019-06-15 DIAGNOSIS — D689 Coagulation defect, unspecified: Secondary | ICD-10-CM | POA: Diagnosis not present

## 2019-06-15 DIAGNOSIS — N186 End stage renal disease: Secondary | ICD-10-CM | POA: Diagnosis not present

## 2019-06-15 DIAGNOSIS — R0600 Dyspnea, unspecified: Secondary | ICD-10-CM | POA: Diagnosis not present

## 2019-06-15 DIAGNOSIS — M6281 Muscle weakness (generalized): Secondary | ICD-10-CM | POA: Diagnosis not present

## 2019-06-15 DIAGNOSIS — R5381 Other malaise: Secondary | ICD-10-CM | POA: Diagnosis not present

## 2019-06-15 DIAGNOSIS — I2510 Atherosclerotic heart disease of native coronary artery without angina pectoris: Secondary | ICD-10-CM | POA: Diagnosis not present

## 2019-06-15 DIAGNOSIS — D631 Anemia in chronic kidney disease: Secondary | ICD-10-CM | POA: Diagnosis not present

## 2019-06-15 DIAGNOSIS — R279 Unspecified lack of coordination: Secondary | ICD-10-CM | POA: Diagnosis not present

## 2019-06-15 DIAGNOSIS — Z992 Dependence on renal dialysis: Secondary | ICD-10-CM | POA: Diagnosis not present

## 2019-06-15 DIAGNOSIS — I120 Hypertensive chronic kidney disease with stage 5 chronic kidney disease or end stage renal disease: Secondary | ICD-10-CM | POA: Diagnosis not present

## 2019-06-15 DIAGNOSIS — D649 Anemia, unspecified: Secondary | ICD-10-CM | POA: Diagnosis not present

## 2019-06-15 DIAGNOSIS — J811 Chronic pulmonary edema: Secondary | ICD-10-CM | POA: Diagnosis not present

## 2019-06-15 DIAGNOSIS — R488 Other symbolic dysfunctions: Secondary | ICD-10-CM | POA: Diagnosis not present

## 2019-06-15 DIAGNOSIS — I959 Hypotension, unspecified: Secondary | ICD-10-CM | POA: Diagnosis not present

## 2019-06-15 DIAGNOSIS — R079 Chest pain, unspecified: Secondary | ICD-10-CM | POA: Diagnosis not present

## 2019-06-15 DIAGNOSIS — R432 Parageusia: Secondary | ICD-10-CM | POA: Diagnosis not present

## 2019-06-15 DIAGNOSIS — R112 Nausea with vomiting, unspecified: Secondary | ICD-10-CM | POA: Diagnosis not present

## 2019-06-15 DIAGNOSIS — I10 Essential (primary) hypertension: Secondary | ICD-10-CM | POA: Diagnosis not present

## 2019-06-15 DIAGNOSIS — R627 Adult failure to thrive: Secondary | ICD-10-CM | POA: Diagnosis not present

## 2019-06-15 DIAGNOSIS — E1129 Type 2 diabetes mellitus with other diabetic kidney complication: Secondary | ICD-10-CM | POA: Diagnosis not present

## 2019-06-15 DIAGNOSIS — K219 Gastro-esophageal reflux disease without esophagitis: Secondary | ICD-10-CM | POA: Diagnosis not present

## 2019-06-15 DIAGNOSIS — I214 Non-ST elevation (NSTEMI) myocardial infarction: Secondary | ICD-10-CM | POA: Diagnosis not present

## 2019-06-15 DIAGNOSIS — J810 Acute pulmonary edema: Secondary | ICD-10-CM | POA: Diagnosis not present

## 2019-06-15 DIAGNOSIS — Z743 Need for continuous supervision: Secondary | ICD-10-CM | POA: Diagnosis not present

## 2019-06-15 DIAGNOSIS — E1122 Type 2 diabetes mellitus with diabetic chronic kidney disease: Secondary | ICD-10-CM | POA: Diagnosis not present

## 2019-06-15 DIAGNOSIS — J9611 Chronic respiratory failure with hypoxia: Secondary | ICD-10-CM | POA: Diagnosis not present

## 2019-06-15 DIAGNOSIS — E7849 Other hyperlipidemia: Secondary | ICD-10-CM | POA: Diagnosis not present

## 2019-06-15 DIAGNOSIS — E46 Unspecified protein-calorie malnutrition: Secondary | ICD-10-CM | POA: Diagnosis not present

## 2019-06-15 DIAGNOSIS — M1389 Other specified arthritis, multiple sites: Secondary | ICD-10-CM | POA: Diagnosis not present

## 2019-06-15 DIAGNOSIS — R2689 Other abnormalities of gait and mobility: Secondary | ICD-10-CM | POA: Diagnosis not present

## 2019-06-15 LAB — GLUCOSE, CAPILLARY
Glucose-Capillary: 122 mg/dL — ABNORMAL HIGH (ref 70–99)
Glucose-Capillary: 125 mg/dL — ABNORMAL HIGH (ref 70–99)
Glucose-Capillary: 164 mg/dL — ABNORMAL HIGH (ref 70–99)
Glucose-Capillary: 175 mg/dL — ABNORMAL HIGH (ref 70–99)

## 2019-06-15 LAB — NOVEL CORONAVIRUS, NAA (HOSP ORDER, SEND-OUT TO REF LAB; TAT 18-24 HRS): SARS-CoV-2, NAA: NOT DETECTED

## 2019-06-15 MED ORDER — INSULIN REGULAR HUMAN 100 UNIT/ML IJ SOLN: 0.0000 [IU] | Freq: Three times a day (TID) | INTRAMUSCULAR | 11 refills | Status: AC

## 2019-06-15 MED ORDER — NEPRO/CARBSTEADY PO LIQD: 237.0000 mL | Freq: Two times a day (BID) | ORAL | 0 refills | Status: DC

## 2019-06-15 NOTE — Progress Notes (Signed)
Physical Therapy Treatment Patient Details Name: Blake [name] MRN: [id number] DOB: 23-Oct-1945 Today's Date: 06/15/2019    History of Present Illness 73 year old gentleman was admitted from home with missed HD session was found to have chest pain and elevated troponin, hypoxia.  Resolved pulm edema and hypoxia, with HD session.  Recently no chest pain despite several incidents.  PMHx:  ESRD, DM, HTN, HLD, osteomyelitis, R toe amp, NSTEMI, stent placement, heart cath Aug 2020    PT Comments    Patient received in bed, reports neck pain when seated at edge of bed. Performed neck ROM exercises and reports improvement. Patient is mod independent with bed mobility, transfers with min guard with cues to stand prior to attempting to walk. Patient ambulated 40 feet with RW and min guard assist. Fatigued at end of walking sitting down abruptly. Patient will continue to benefit from skilled PT to address his weakness and decreased functional mobility.       Follow Up Recommendations  Home health PT;Supervision for mobility/OOB     Equipment Recommendations  None recommended by PT    Recommendations for Other Services       Precautions / Restrictions Precautions Precautions: Fall Restrictions Weight Bearing Restrictions: No    Mobility  Bed Mobility Overal bed mobility: Modified Independent Bed Mobility: Supine to Sit;Sit to Supine     Supine to sit: Modified independent (Device/Increase time) Sit to supine: Modified independent (Device/Increase time)      Transfers Overall transfer level: Needs assistance Equipment used: Rolling walker (2 wheeled) Transfers: Sit to/from Stand Sit to Stand: Min guard            Ambulation/Gait Ambulation/Gait assistance: Min guard Gait Distance (Feet): 40 Feet Assistive device: Rolling walker (2 wheeled) Gait Pattern/deviations: Step-through pattern;Narrow base of support;Decreased stride length Gait velocity: decr   General Gait  Details: fair balance, benefits from UE support with mobility, fatigues quickly   Stairs             Wheelchair Mobility    Modified Rankin (Stroke Patients Only)       Balance Overall balance assessment: Needs assistance Sitting-balance support: Feet supported Sitting balance-Leahy Scale: Good     Standing balance support: Bilateral upper extremity supported Standing balance-Leahy Scale: Fair Standing balance comment: reliant on RW                            Cognition   Behavior During Therapy: WFL for tasks assessed/performed Overall Cognitive Status: Within Functional Limits for tasks assessed                                        Exercises Other Exercises Other Exercises: cervical spine ROM exercises in sitting to include: flexion, extension, side bending, head rotations    General Comments        Pertinent Vitals/Pain Pain Assessment: Faces Faces Pain Scale: Hurts a little bit Pain Location: neck Pain Descriptors / Indicators: Aching;Sore;Tightness Pain Intervention(s): Limited activity within patient's tolerance;Monitored during session;Repositioned;Other (comment)(ROM exercises for cervical spine)    Home Living Family/patient expects to be discharged to:: Private residence Living Arrangements: Other relatives Available Help at Discharge: Available PRN/intermittently;Family                Prior Function            PT Goals (current goals can  now be found in the care plan section) Acute Rehab PT Goals Patient Stated Goal: feel better PT Goal Formulation: With patient Time For Goal Achievement: 06/24/19 Potential to Achieve Goals: Good Progress towards PT goals: Progressing toward goals    Frequency    Min 2X/week      PT Plan Current plan remains appropriate    Co-evaluation              AM-PAC PT "6 Clicks" Mobility   Outcome Measure  Help needed turning from your back to your side while  in a flat bed without using bedrails?: None Help needed moving from lying on your back to sitting on the side of a flat bed without using bedrails?: None Help needed moving to and from a bed to a chair (including a wheelchair)?: A Little Help needed standing up from a chair using your arms (e.g., wheelchair or bedside chair)?: A Little Help needed to walk in hospital room?: A Little Help needed climbing 3-5 steps with a railing? : A Lot 6 Click Score: 19    End of Session Equipment Utilized During Treatment: Gait belt Activity Tolerance: Patient tolerated treatment well;Patient limited by fatigue Patient left: in bed;with bed alarm set;with call bell/phone within reach Nurse Communication: Mobility status PT Visit Diagnosis: Unsteadiness on feet (R26.81);Muscle weakness (generalized) (M62.81);Difficulty in walking, not elsewhere classified (R26.2);Pain Pain - part of body: (neck)     Time: [id number] PT Time Calculation (min) (ACUTE ONLY): 19 min  Charges:  $Gait Training: 8-22 mins                     Tayler Heiden, PT, GCS 06/15/19,9:47 AM

## 2019-06-15 NOTE — TOC Transition Note (Signed)
Transition of Care [facility]) - CM/SW Discharge Note   Patient Details  Name: Blake [name] MRN: [id number] Date of Birth: 04-08-1946  [facility] Care [facility]) CM/SW Contact:  Bethena Roys, RN Phone Number: 06/15/2019, 2:38 PM   Clinical Narrative:   Pt is agreeable to SNF @ [facility] today. Pt will go to room [facility] @ [facility]. Pt will need hard scripts for prescriptions. Staff RN to call report to [phone]. Niece [name] will go to [employer] to fill out paperwork and drop clothing off. PTAR to pick up at 3:30. No further ne   Final next level of care: Skilled Nursing Facility Barriers to Discharge: No Barriers Identified   Patient Goals and CMS Choice Patient states their goals for this hospitalization and ongoing recovery are:: "to return home"   Choice offered to / list presented to : NA   Discharge Plan and Services In-house Referral: NA Discharge Planning Services: CM Consult Post Acute Care Choice: Skilled Nursing Facility          HH Arranged: Refused HH(Case Manager did state to call PCP if needs assistance in the future.)    Readmission Risk Interventions Readmission Risk Prevention Plan 06/11/2019  Transportation Screening Complete  HRI or Home Care Consult Complete  Social Work Consult for [place] Planning/Counseling Complete  Palliative Care Screening Not Applicable  Medication Review Press photographer) Complete  Some recent data might be hidden

## 2019-06-15 NOTE — Discharge Summary (Signed)
PATIENT DETAILS Name: Blake [name] Age: 73 y.o. Sex: male Date of Birth: Apr 08, 1946 MRN: [id number]. Admitting Physician: Vianne Bulls, MD [id number], Mauro Kaufmann, MD  Admit Date: 06/09/2019 Discharge date: 06/15/2019  Recommendations for Outpatient Follow-up:  1. Follow up with PCP in 1-2 weeks 2. Please obtain BMP/CBC in one week 3. Please ensure follow-up with cardiology, gastroenterology, nephrology and with his hemodialysis clinic.  4. Consider outpatient pulmonology evaluation (if not already completed)-patient appears to have incidental finding of mediastinal and hilar lymphadenopathy on CT angiogram of the chest (per radiology these are stable findings)  Admitted From:  Home  Disposition: SNF   Home Health: No  Equipment/Devices: None  Discharge Condition: Stable  CODE STATUS: FULL CODE  Diet recommendation:  Diet Order            Diet - low sodium heart healthy        Diet Carb Modified        Diet renal/carb modified with fluid restriction Diet-HS Snack? Nothing; Fluid restriction: 1200 mL Fluid; Room service appropriate? Yes; Fluid consistency: Thin  Diet effective now               Brief Summary: See H&P, Labs, Consult and Test reports for all details in brief, Patient is a 73 y.o. male with history of ESRD on HD TTS, CAD s/p PCI 1 month back, HTN, IDDM-who presented with fatigue and atypical chest pain.  Brief Hospital Course: Failure to thrive syndrome/protein calorie malnutrition/weight loss: No CT evidence of malignancy-evaluated by [facility]-since patient recently had a PCI and is on dual antiplatelet agents-GI recommending further endoscopic evaluation to be done in the outpatient setting.   Chest pain: Felt to be atypical-could be related to GI issues-esophagitis-continue PPI.  Has history of CAD-see below  History of CAD-with recent non-STEMI in August 2020 requiring DES to circumflex and LAD: No anginal symptoms-denies any chest  pain or shortness of breath this morning.  Remains on dual antiplatelet agents with aspirin/Plavix along with beta-blocker and statin.  Note-chart reviewed with cardiology on-call Dr.Schumann-looks like patient was switched over to Plavix from [place] right before his discharge from the hospital in August.  Okay to continue with aspirin/Plavix-cardiology will arrange for follow-up.  ESRD on HD TTS: Nephrology following for hemodialysis.  Please resume HD at his usual schedule.  DM-2: CBG stable-continue with SSI  Anemia: Likely related to ESRD-stable-continue to follow closely.  GERD/esophagitis: Continue PPI.  Unstageable pressure injury left lateral ankle, POA  Debility/deconditioning: Per patient at baseline-he uses a cane to get around the house.  He also has a wheelchair and a walker.  Suspect worsening of his deconditioning is due to weight loss/acute illness-spoke with case management/social work today-they are working on [employer] placement.  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 [facility])   Dyspnea   Discharge Instructions: Check CBGs before meals and at bedtime  Activity:  As tolerated with Full fall precautions use walker/cane & assistance as needed  Discharge Instructions    Call MD for:  difficulty breathing, headache or visual disturbances   Complete by: As directed    Call MD for:  persistant nausea and vomiting   Complete by: As directed    Call  MD for:  severe uncontrolled pain   Complete by: As directed    Diet - low sodium heart healthy   Complete by: As directed    Diet Carb Modified   Complete by: As directed    Discharge  instructions   Complete by: As directed    Follow with Primary MD  [name], MD in 1-2 weeks  Please get a complete blood count and chemistry panel checked by your Primary MD at your next visit, and again as instructed by your Primary MD.  Get Medicines reviewed and adjusted: Please take all your medications with you for your next visit with your Primary MD  Laboratory/radiological data: Please request your Primary MD to go over all hospital tests and procedure/radiological results at the follow up, please ask your Primary MD to get all Hospital records sent to his/her office.  In some cases, they will be blood work, cultures and biopsy results pending at the time of your discharge. Please request that your primary care M.D. follows up on these results.  Also Note the following: If you experience worsening of your admission symptoms, develop shortness of breath, life threatening emergency, suicidal or homicidal thoughts you must seek medical attention immediately by calling 911 or calling your MD immediately  if symptoms less severe.  You must read complete instructions/literature along with all the possible adverse reactions/side effects for all the Medicines you take and that have been prescribed to you. Take any new Medicines after you have completely understood and accpet all the possible adverse reactions/side effects.   Do not drive when taking Pain medications or sleeping medications (Benzodaizepines)  Do not take more than prescribed Pain, Sleep and Anxiety Medications. It is not advisable to combine anxiety,sleep and pain medications without talking with your primary care practitioner  Special Instructions: If you have smoked or chewed Tobacco  in the last 2 yrs please stop smoking, stop any regular Alcohol  and or any Recreational drug use.  Wear Seat belts while driving.  Please note: You were cared for by a hospitalist during your hospital stay. Once you are  discharged, your primary care physician will handle any further medical issues. Please note that NO REFILLS for any discharge medications will be authorized once you are discharged, as it is imperative that you return to your primary care physician (or establish a relationship with a primary care physician if you do not have one) for your post hospital discharge needs so that they can reassess your need for medications and monitor your lab values.   Increase activity slowly   Complete by: As directed      Allergies as of 06/15/2019   No Known Allergies     Medication List    STOP taking these medications   Brilinta 90 MG Tabs tablet Generic drug: ticagrelor   HYDROcodone-acetaminophen 5-325 MG tablet Commonly known as: Norco   ondansetron 4 MG tablet Commonly known as: ZOFRAN     TAKE these medications   aspirin EC 81 MG tablet Take 81 mg by mouth daily.   atorvastatin 40 MG tablet Commonly known as: LIPITOR Take 1 tablet (40 mg total) by mouth at bedtime.   clopidogrel 75 MG tablet Commonly known as: Plavix Take 1 tablet (75 mg total) by mouth daily.   feeding supplement (NEPRO CARB STEADY) Liqd Take 237 mLs by mouth 2 (two) times daily between meals.   insulin regular 100 units/mL injection Commonly known as: NovoLIN R ReliOn Inject 0-0.09 mLs (0-9  Units total) into the skin 3 (three) times daily with meals. CBG < 70: implement hypoglycemia protocol CBG 70 - 120: 0 units CBG 121 - 150: 1 unit CBG 151 - 200: 2 units CBG 201 - 250: 3 units CBG 251 - 300: 5 units CBG 301 - 350: 7 units CBG 351 - 400: 9 units CBG > 400: call MD. What changed:   how much to take  additional instructions   metoprolol tartrate 25 MG tablet Commonly known as: LOPRESSOR Take 0.5 tablets (12.5 mg total) by mouth 2 (two) times daily.   Nephro Vitamins 0.8 MG Tabs Take 0.8 mg by mouth daily.   pantoprazole 40 MG tablet Commonly known as: PROTONIX Take 40 mg by mouth 2 (two) times  daily.   PARoxetine 10 MG tablet Commonly known as: PAXIL Take 10 mg by mouth daily.   sevelamer carbonate 800 MG tablet Commonly known as: RENVELA Take 800 mg by mouth 3 (three) times daily with meals.      Follow-up Information    Erlene Quan, PA-C Follow up on 07/06/2019.   Specialties: Cardiology, Radiology Why: Please go to follow-up appointment October 23 at 8:45 AM Contact information: [address] [place] [address] [phone]        [name], MD. Schedule an appointment as soon as possible for a visit in 1 week(s).   Specialty: Internal Medicine Contact information: [address] [place] [address] [phone]        Lelon Perla, MD .   Specialty: Cardiology Contact information: [address] [place] [address] [phone]        Hemodialysis center Follow up.   Why: Follow at your usual schedule.         No Known Allergies   Consultations:   nephrology   Other Procedures/Studies: Ct Angio Chest Pe W Or Wo Contrast  Result Date: 06/10/2019 CLINICAL DATA:  Shortness of breath and edema. History of end-stage renal disease. EXAM: CT ANGIOGRAPHY CHEST WITH CONTRAST TECHNIQUE: Multidetector CT imaging of the chest was performed using the standard protocol during bolus administration of intravenous contrast. Multiplanar CT image reconstructions and MIPs were obtained to evaluate the vascular anatomy. CONTRAST:  92mL OMNIPAQUE IOHEXOL 350 MG/ML SOLN COMPARISON:  04/29/2019 FINDINGS: Cardiovascular: The pulmonary arteries are adequately opacified. There is no evidence of pulmonary embolism. The heart size is stable and at the upper limits of normal. Coronary stent present. No pericardial fluid identified. The thoracic aorta is normal in caliber. Mediastinum/Nodes: Stable mildly enlarged mediastinal lymph nodes and enlarged right hilar lymph nodes. Lungs/Pleura: Stable chronic lung  disease with more prominent disease in the right lung compared to the left. It would be difficult to exclude subtle areas infection but overall appearance of both lungs appears very similar since the prior CTA. Probable component of mild chronic pulmonary interstitial edema. No pleural effusions. No pneumothorax. No pulmonary masses. Upper Abdomen: No acute abnormality. Musculoskeletal: No chest wall abnormality. No acute or significant osseous findings. Review of the MIP images confirms the above findings. IMPRESSION: 1. No evidence of pulmonary embolism. 2. Stable mildly enlarged mediastinal lymph nodes and enlarged right hilar lymph nodes. 3. Stable chronic lung disease, right greater than left. There may be a component of mild chronic pulmonary interstitial edema. Electronically Signed   By: Aletta Edouard M.D.   On: 06/10/2019 16:05   Ct Abdomen Pelvis W Contrast  Result Date: 06/12/2019 CLINICAL DATA:  73 year old male with  unexplained weight loss and abdominal pain. EXAM: CT ABDOMEN AND PELVIS WITH CONTRAST TECHNIQUE: Multidetector CT imaging of the abdomen and pelvis was performed using the standard protocol following bolus administration of intravenous contrast. CONTRAST:  138mL OMNIPAQUE IOHEXOL 300 MG/ML  SOLN COMPARISON:  12/22/2018 CT FINDINGS: Lower chest: Chronic interstitial changes within both lung bases again noted. Cardiomegaly again identified. Hepatobiliary: The liver and gallbladder are unremarkable. No biliary dilatation. Pancreas: Unremarkable Spleen: Unremarkable Adrenals/Urinary Tract: Bilateral renal atrophy and small indeterminate low-density renal masses are unchanged. No evidence of hydronephrosis or obstructing urinary calculi. The adrenal glands are unremarkable. Mild circumferential bladder wall thickening again noted. Stomach/Bowel: Stomach is within normal limits. Appendix appears normal. No evidence of bowel wall thickening, distention, or inflammatory changes.  Vascular/Lymphatic: Aortic atherosclerosis. No enlarged abdominal or pelvic lymph nodes. Reproductive: Mild prostate enlargement again noted. Other: No ascites, focal collection or pneumoperitoneum. Musculoskeletal: No acute or suspicious bony abnormalities. Moderate to severe degenerative disc disease, spondylosis and broad-based disc bulge at L5-S1 noted. IMPRESSION: 1. No evidence of acute abnormality. 2. Bilateral renal atrophy and small unchanged renal lesions, likely cysts. 3. Mild prostate enlargement and mild circumferential bladder wall thickening again noted. 4. Cardiomegaly with chronic interstitial lung disease/fibrosis. 5.  Aortic Atherosclerosis (ICD10-I70.0). Electronically Signed   By: Margarette Canada M.D.   On: 06/12/2019 16:35   Dg Chest Portable 1 View  Result Date: 06/09/2019 CLINICAL DATA:  Shortness of breath. EXAM: PORTABLE CHEST 1 VIEW COMPARISON:  05/03/2019 FINDINGS: 0644 hours. The cardio pericardial silhouette is enlarged. Diffuse interstitial and central alveolar opacity again noted in both lungs. No substantial pleural effusion. Right IJ dialysis catheter is stable. The visualized bony structures of the thorax are intact. Insert wire IMPRESSION: Stable exam. Cardiomegaly with diffuse interstitial and central alveolar opacity bilaterally. Imaging features may reflect pulmonary edema although underlying chronic infectious/inflammatory etiology not excluded. Electronically Signed   By: Misty Stanley M.D.   On: 06/09/2019 07:45     TODAY-DAY OF DISCHARGE:  Subjective:   Blake [name] today has no headache,no chest abdominal pain,no new weakness tingling or numbness, feels much better wants to go home today.   Objective:   Blood pressure (!) 128/53, pulse 63, temperature 97.8 F (36.6 C), temperature source Oral, resp. rate 15, height 5\' 8"  (1.727 m), weight 75.7 kg, SpO2 96 %.  Intake/Output Summary (Last 24 hours) at 06/15/2019 1131 Last data filed at 06/15/2019  0600 Gross per 24 hour  Intake 120 ml  Output 0 ml  Net 120 ml   Filed Weights   06/14/19 0721 06/14/19 1100 06/15/19 0526  Weight: 79.2 kg 76.3 kg 75.7 kg    Exam: Awake Alert, Oriented *3, No new F.N deficits, Normal affect [place].AT,PERRAL Supple Neck,No JVD, No cervical lymphadenopathy appriciated.  Symmetrical Chest wall movement, Good air movement bilaterally, CTAB RRR,No Gallops,Rubs or new Murmurs, No Parasternal Heave +ve B.Sounds, Abd Soft, Non tender, No organomegaly appriciated, No rebound -guarding or rigidity. No Cyanosis, Clubbing or edema, No new Rash or bruise   PERTINENT RADIOLOGIC STUDIES: Ct Angio Chest Pe W Or Wo Contrast  Result Date: 06/10/2019 CLINICAL DATA:  Shortness of breath and edema. History of end-stage renal disease. EXAM: CT ANGIOGRAPHY CHEST WITH CONTRAST TECHNIQUE: Multidetector CT imaging of the chest was performed using the standard protocol during bolus administration of intravenous contrast. Multiplanar CT image reconstructions and MIPs were obtained to evaluate the vascular anatomy. CONTRAST:  39mL OMNIPAQUE IOHEXOL 350 MG/ML SOLN COMPARISON:  04/29/2019 FINDINGS: Cardiovascular: The pulmonary arteries  are adequately opacified. There is no evidence of pulmonary embolism. The heart size is stable and at the upper limits of normal. Coronary stent present. No pericardial fluid identified. The thoracic aorta is normal in caliber. Mediastinum/Nodes: Stable mildly enlarged mediastinal lymph nodes and enlarged right hilar lymph nodes. Lungs/Pleura: Stable chronic lung disease with more prominent disease in the right lung compared to the left. It would be difficult to exclude subtle areas infection but overall appearance of both lungs appears very similar since the prior CTA. Probable component of mild chronic pulmonary interstitial edema. No pleural effusions. No pneumothorax. No pulmonary masses. Upper Abdomen: No acute abnormality. Musculoskeletal: No chest wall  abnormality. No acute or significant osseous findings. Review of the MIP images confirms the above findings. IMPRESSION: 1. No evidence of pulmonary embolism. 2. Stable mildly enlarged mediastinal lymph nodes and enlarged right hilar lymph nodes. 3. Stable chronic lung disease, right greater than left. There may be a component of mild chronic pulmonary interstitial edema. Electronically Signed   By: Aletta Edouard M.D.   On: 06/10/2019 16:05   Ct Abdomen Pelvis W Contrast  Result Date: 06/12/2019 CLINICAL DATA:  73 year old male with unexplained weight loss and abdominal pain. EXAM: CT ABDOMEN AND PELVIS WITH CONTRAST TECHNIQUE: Multidetector CT imaging of the abdomen and pelvis was performed using the standard protocol following bolus administration of intravenous contrast. CONTRAST:  18mL OMNIPAQUE IOHEXOL 300 MG/ML  SOLN COMPARISON:  12/22/2018 CT FINDINGS: Lower chest: Chronic interstitial changes within both lung bases again noted. Cardiomegaly again identified. Hepatobiliary: The liver and gallbladder are unremarkable. No biliary dilatation. Pancreas: Unremarkable Spleen: Unremarkable Adrenals/Urinary Tract: Bilateral renal atrophy and small indeterminate low-density renal masses are unchanged. No evidence of hydronephrosis or obstructing urinary calculi. The adrenal glands are unremarkable. Mild circumferential bladder wall thickening again noted. Stomach/Bowel: Stomach is within normal limits. Appendix appears normal. No evidence of bowel wall thickening, distention, or inflammatory changes. Vascular/Lymphatic: Aortic atherosclerosis. No enlarged abdominal or pelvic lymph nodes. Reproductive: Mild prostate enlargement again noted. Other: No ascites, focal collection or pneumoperitoneum. Musculoskeletal: No acute or suspicious bony abnormalities. Moderate to severe degenerative disc disease, spondylosis and broad-based disc bulge at L5-S1 noted. IMPRESSION: 1. No evidence of acute abnormality. 2.  Bilateral renal atrophy and small unchanged renal lesions, likely cysts. 3. Mild prostate enlargement and mild circumferential bladder wall thickening again noted. 4. Cardiomegaly with chronic interstitial lung disease/fibrosis. 5.  Aortic Atherosclerosis (ICD10-I70.0). Electronically Signed   By: Margarette Canada M.D.   On: 06/12/2019 16:35   Dg Chest Portable 1 View  Result Date: 06/09/2019 CLINICAL DATA:  Shortness of breath. EXAM: PORTABLE CHEST 1 VIEW COMPARISON:  05/03/2019 FINDINGS: 0644 hours. The cardio pericardial silhouette is enlarged. Diffuse interstitial and central alveolar opacity again noted in both lungs. No substantial pleural effusion. Right IJ dialysis catheter is stable. The visualized bony structures of the thorax are intact. Insert wire IMPRESSION: Stable exam. Cardiomegaly with diffuse interstitial and central alveolar opacity bilaterally. Imaging features may reflect pulmonary edema although underlying chronic infectious/inflammatory etiology not excluded. Electronically Signed   By: Misty Stanley M.D.   On: 06/09/2019 07:45     PERTINENT LAB RESULTS: CBC: Recent Labs    06/13/19 0757  WBC 7.5  HGB 9.2*  HCT 29.0*  PLT 158   CMET CMP     Component Value Date/Time   NA 135 06/13/2019 0757   K 4.6 06/13/2019 0757   CL 98 06/13/2019 0757   CO2 22 06/13/2019 0757   GLUCOSE  134 (H) 06/13/2019 0757   BUN 80 (H) 06/13/2019 0757   CREATININE 11.73 (H) 06/13/2019 0757   CALCIUM 8.7 (L) 06/13/2019 0757   PROT 6.8 06/12/2019 0354   ALBUMIN 2.8 (L) 06/13/2019 0757   AST 22 06/12/2019 0354   ALT 24 06/12/2019 0354   ALKPHOS 119 06/12/2019 0354   BILITOT 0.5 06/12/2019 0354   GFRNONAA 4 (L) 06/13/2019 0757   GFRAA 4 (L) 06/13/2019 0757    GFR Estimated Creatinine Clearance: 5.6 mL/min (A) (by C-G formula based on SCr of 11.73 mg/dL (H)). No results for input(s): LIPASE, AMYLASE in the last 72 hours. No results for input(s): CKTOTAL, CKMB, CKMBINDEX, TROPONINI in the  last 72 hours. Invalid input(s): POCBNP No results for input(s): DDIMER in the last 72 hours. No results for input(s): HGBA1C in the last 72 hours. No results for input(s): CHOL, HDL, LDLCALC, TRIG, CHOLHDL, LDLDIRECT in the last 72 hours. No results for input(s): TSH, T4TOTAL, T3FREE, THYROIDAB in the last 72 hours.  Invalid input(s): FREET3 No results for input(s): VITAMINB12, FOLATE, FERRITIN, TIBC, IRON, RETICCTPCT in the last 72 hours. Coags: No results for input(s): INR in the last 72 hours.  Invalid input(s): PT Microbiology: Recent Results (from the past 240 hour(s))  SARS CORONAVIRUS 2 (TAT 6-24 HRS) Nasopharyngeal Nasopharyngeal Swab     Status: None   Collection Time: 06/09/19  8:24 AM   Specimen: Nasopharyngeal Swab  Result Value Ref Range Status   SARS Coronavirus 2 NEGATIVE NEGATIVE Final    Comment: (NOTE) SARS-CoV-2 target nucleic acids are NOT DETECTED. The SARS-CoV-2 RNA is generally detectable in upper and lower respiratory specimens during the acute phase of infection. Negative results do not preclude SARS-CoV-2 infection, do not rule out co-infections with other pathogens, and should not be used as the sole basis for treatment or other patient management decisions. Negative results must be combined with clinical observations, patient history, and epidemiological information. The expected result is Negative. Fact Sheet for Patients: SugarRoll.be Fact Sheet for Healthcare Providers: https://www.woods-mathews.com/ This test is not yet approved or cleared by the [place] FDA and  has been authorized for detection and/or diagnosis of SARS-CoV-2 by FDA under an Emergency Use Authorization (EUA). This EUA will remain  in effect (meaning this test can be used) for the duration of the COVID-19 declaration under Section 56 4(b)(1) of the Act, 21 U.S.C. section 360bbb-3(b)(1), unless the authorization is terminated  or revoked sooner. Performed at [facility], [place] [address] [place], [place] [address]     FURTHER DISCHARGE INSTRUCTIONS:  Get Medicines reviewed and adjusted: Please take all your medications with you for your next visit with your Primary MD  Laboratory/radiological data: Please request your Primary MD to go over all hospital tests and procedure/radiological results at the follow up, please ask your Primary MD to get all Hospital records sent to his/her office.  In some cases, they will be blood work, cultures and biopsy results pending at the time of your discharge. Please request that your primary care M.D. goes through all the records of your hospital data and follows up on these results.  Also Note the following: If you experience worsening of your admission symptoms, develop shortness of breath, life threatening emergency, suicidal or homicidal thoughts you must seek medical attention immediately by calling 911 or calling your MD immediately  if symptoms less severe.  You must read complete instructions/literature along with all the possible adverse reactions/side effects for all the Medicines you take  and that have been prescribed to you. Take any new Medicines after you have completely understood and accpet all the possible adverse reactions/side effects.   Do not drive when taking Pain medications or sleeping medications (Benzodaizepines)  Do not take more than prescribed Pain, Sleep and Anxiety Medications. It is not advisable to combine anxiety,sleep and pain medications without talking with your primary care practitioner  Special Instructions: If you have smoked or chewed Tobacco  in the last 2 yrs please stop smoking, stop any regular Alcohol  and or any Recreational drug use.  Wear Seat belts while driving.  Please note: You were cared for by a hospitalist during your hospital stay. Once you are discharged, your primary care physician will handle any further  medical issues. Please note that NO REFILLS for any discharge medications will be authorized once you are discharged, as it is imperative that you return to your primary care physician (or establish a relationship with a primary care physician if you do not have one) for your post hospital discharge needs so that they can reassess your need for medications and monitor your lab values.  Total Time spent coordinating discharge including counseling, education and face to face time equals  45 minutes.  SignedOren Binet 06/15/2019 11:31 AM

## 2019-06-15 NOTE — Consult Note (Signed)
   [facility] Inpatient Consult   06/15/2019  Blake [name] May 19, 1946 [id number]    Patient was checked for potential [facility] care management services needed with a 30% extreme high risk score forunplanned readmission and hospitalizations under hisUnited HealthCare Medicare benefits.  Chart review andMD brief summary reveal as: Patient is a72 y.o.malewith history of ESRD on HD TTS, CAD s/p PCI 1 month back, HTN, IDDM,  who presented with fatigue and atypical chest pain. [Failure to thrive syndrome/ protein calorie malnutrition/ weight loss, Chest pain felt to be atypical-could be related to GI issues-esophagitis, anemia, Debility/deconditioning]  Patient's primary care provider is Dr. [name] with [facility], listed as providing transition of care.  Transition of care CM notes state that patient's expected discharge is SNF (skilled nursing facility- [facility]) that can transport to HD center for chair time. His niece was at home with him prior to admission.  No [facility] Care Management needs identified at this point for post hospital follow-up as patient's care will be met at the skilled level of care.   For questions and additional information, please call:  [name] [name], BSN, RN-BC [facility] Liaison Cell: [phone]

## 2019-06-15 NOTE — TOC Progression Note (Addendum)
Transition of Care [facility]) - Progression Note    Patient Details  Name: Blake [name] MRN: [id number] Date of Birth: 1946-06-22  Transition of Care [facility]) CM/SW Contact  Graves-Bigelow, Ocie Cornfield, RN Phone Number: 06/15/2019, 11:45 AM  Clinical Narrative:   [place] authorization from [place] for COVID results. CM will follow for test results. SNF Liaison & MD aware.   Expected Discharge Plan: Skilled Nursing Facility Barriers to Discharge: No Barriers Identified  Expected Discharge Plan and Services Expected Discharge Plan: [place] In-house Referral: NA Discharge Planning Services: CM Consult Post Acute Care Choice: [place] Living arrangements for the past 2 months: Single Family Home Expected Discharge Date: 06/15/19      [facility] Arranged: Refused HH(Case Manager did state to call PCP if needs assistance in the future.)    Social Determinants of Health (SDOH) Interventions    Readmission Risk Interventions Readmission Risk Prevention Plan 06/11/2019  Transportation Screening Complete  HRI or [place] Complete  Social Work Consult for [place] Planning/Counseling Complete  Palliative Care Screening Not Applicable  Medication Review Press photographer) Complete  Some recent data might be hidden

## 2019-06-15 NOTE — Care Management Important Message (Signed)
Important Message  Patient Details  Name: Blake [name] MRN: [id number] Date of Birth: 1946-03-05   Medicare Important Message Given:  Yes     Orbie Pyo 06/15/2019, 4:05 PM

## 2019-06-16 DIAGNOSIS — E1129 Type 2 diabetes mellitus with other diabetic kidney complication: Secondary | ICD-10-CM | POA: Diagnosis not present

## 2019-06-16 DIAGNOSIS — N186 End stage renal disease: Secondary | ICD-10-CM | POA: Diagnosis not present

## 2019-06-16 DIAGNOSIS — D509 Iron deficiency anemia, unspecified: Secondary | ICD-10-CM | POA: Diagnosis not present

## 2019-06-16 DIAGNOSIS — Z992 Dependence on renal dialysis: Secondary | ICD-10-CM | POA: Diagnosis not present

## 2019-06-16 DIAGNOSIS — D689 Coagulation defect, unspecified: Secondary | ICD-10-CM | POA: Diagnosis not present

## 2019-06-16 DIAGNOSIS — D631 Anemia in chronic kidney disease: Secondary | ICD-10-CM | POA: Diagnosis not present

## 2019-06-16 DIAGNOSIS — N2581 Secondary hyperparathyroidism of renal origin: Secondary | ICD-10-CM | POA: Diagnosis not present

## 2019-06-18 DIAGNOSIS — I2510 Atherosclerotic heart disease of native coronary artery without angina pectoris: Secondary | ICD-10-CM | POA: Diagnosis not present

## 2019-06-18 DIAGNOSIS — N186 End stage renal disease: Secondary | ICD-10-CM | POA: Diagnosis not present

## 2019-06-18 DIAGNOSIS — R627 Adult failure to thrive: Secondary | ICD-10-CM | POA: Diagnosis not present

## 2019-06-18 DIAGNOSIS — R432 Parageusia: Secondary | ICD-10-CM | POA: Diagnosis not present

## 2019-06-19 DIAGNOSIS — D689 Coagulation defect, unspecified: Secondary | ICD-10-CM | POA: Diagnosis not present

## 2019-06-19 DIAGNOSIS — D509 Iron deficiency anemia, unspecified: Secondary | ICD-10-CM | POA: Diagnosis not present

## 2019-06-19 DIAGNOSIS — D631 Anemia in chronic kidney disease: Secondary | ICD-10-CM | POA: Diagnosis not present

## 2019-06-19 DIAGNOSIS — N186 End stage renal disease: Secondary | ICD-10-CM | POA: Diagnosis not present

## 2019-06-19 DIAGNOSIS — Z992 Dependence on renal dialysis: Secondary | ICD-10-CM | POA: Diagnosis not present

## 2019-06-19 DIAGNOSIS — N2581 Secondary hyperparathyroidism of renal origin: Secondary | ICD-10-CM | POA: Diagnosis not present

## 2019-06-19 DIAGNOSIS — E1129 Type 2 diabetes mellitus with other diabetic kidney complication: Secondary | ICD-10-CM | POA: Diagnosis not present

## 2019-06-21 DIAGNOSIS — N2581 Secondary hyperparathyroidism of renal origin: Secondary | ICD-10-CM | POA: Diagnosis not present

## 2019-06-21 DIAGNOSIS — D509 Iron deficiency anemia, unspecified: Secondary | ICD-10-CM | POA: Diagnosis not present

## 2019-06-21 DIAGNOSIS — Z992 Dependence on renal dialysis: Secondary | ICD-10-CM | POA: Diagnosis not present

## 2019-06-21 DIAGNOSIS — D689 Coagulation defect, unspecified: Secondary | ICD-10-CM | POA: Diagnosis not present

## 2019-06-21 DIAGNOSIS — D631 Anemia in chronic kidney disease: Secondary | ICD-10-CM | POA: Diagnosis not present

## 2019-06-21 DIAGNOSIS — E1129 Type 2 diabetes mellitus with other diabetic kidney complication: Secondary | ICD-10-CM | POA: Diagnosis not present

## 2019-06-21 DIAGNOSIS — N186 End stage renal disease: Secondary | ICD-10-CM | POA: Diagnosis not present

## 2019-06-23 DIAGNOSIS — D689 Coagulation defect, unspecified: Secondary | ICD-10-CM | POA: Diagnosis not present

## 2019-06-23 DIAGNOSIS — E1129 Type 2 diabetes mellitus with other diabetic kidney complication: Secondary | ICD-10-CM | POA: Diagnosis not present

## 2019-06-23 DIAGNOSIS — N2581 Secondary hyperparathyroidism of renal origin: Secondary | ICD-10-CM | POA: Diagnosis not present

## 2019-06-23 DIAGNOSIS — Z992 Dependence on renal dialysis: Secondary | ICD-10-CM | POA: Diagnosis not present

## 2019-06-23 DIAGNOSIS — D509 Iron deficiency anemia, unspecified: Secondary | ICD-10-CM | POA: Diagnosis not present

## 2019-06-23 DIAGNOSIS — D631 Anemia in chronic kidney disease: Secondary | ICD-10-CM | POA: Diagnosis not present

## 2019-06-23 DIAGNOSIS — N186 End stage renal disease: Secondary | ICD-10-CM | POA: Diagnosis not present

## 2019-06-26 DIAGNOSIS — N186 End stage renal disease: Secondary | ICD-10-CM | POA: Diagnosis not present

## 2019-06-26 DIAGNOSIS — N2581 Secondary hyperparathyroidism of renal origin: Secondary | ICD-10-CM | POA: Diagnosis not present

## 2019-06-26 DIAGNOSIS — D509 Iron deficiency anemia, unspecified: Secondary | ICD-10-CM | POA: Diagnosis not present

## 2019-06-26 DIAGNOSIS — Z992 Dependence on renal dialysis: Secondary | ICD-10-CM | POA: Diagnosis not present

## 2019-06-26 DIAGNOSIS — E1129 Type 2 diabetes mellitus with other diabetic kidney complication: Secondary | ICD-10-CM | POA: Diagnosis not present

## 2019-06-26 DIAGNOSIS — D689 Coagulation defect, unspecified: Secondary | ICD-10-CM | POA: Diagnosis not present

## 2019-06-26 DIAGNOSIS — D631 Anemia in chronic kidney disease: Secondary | ICD-10-CM | POA: Diagnosis not present

## 2019-06-28 DIAGNOSIS — D631 Anemia in chronic kidney disease: Secondary | ICD-10-CM | POA: Diagnosis not present

## 2019-06-28 DIAGNOSIS — Z992 Dependence on renal dialysis: Secondary | ICD-10-CM | POA: Diagnosis not present

## 2019-06-28 DIAGNOSIS — D689 Coagulation defect, unspecified: Secondary | ICD-10-CM | POA: Diagnosis not present

## 2019-06-28 DIAGNOSIS — D509 Iron deficiency anemia, unspecified: Secondary | ICD-10-CM | POA: Diagnosis not present

## 2019-06-28 DIAGNOSIS — E1129 Type 2 diabetes mellitus with other diabetic kidney complication: Secondary | ICD-10-CM | POA: Diagnosis not present

## 2019-06-28 DIAGNOSIS — N186 End stage renal disease: Secondary | ICD-10-CM | POA: Diagnosis not present

## 2019-06-28 DIAGNOSIS — N2581 Secondary hyperparathyroidism of renal origin: Secondary | ICD-10-CM | POA: Diagnosis not present

## 2019-06-29 ENCOUNTER — Institutional Professional Consult (permissible substitution): Payer: Medicare Other | Admitting: Internal Medicine

## 2019-06-29 ENCOUNTER — Telehealth: Payer: Self-pay | Admitting: Internal Medicine

## 2019-06-29 DIAGNOSIS — K219 Gastro-esophageal reflux disease without esophagitis: Secondary | ICD-10-CM | POA: Diagnosis not present

## 2019-06-29 DIAGNOSIS — Z992 Dependence on renal dialysis: Secondary | ICD-10-CM | POA: Diagnosis not present

## 2019-06-29 DIAGNOSIS — R627 Adult failure to thrive: Secondary | ICD-10-CM | POA: Diagnosis not present

## 2019-06-29 DIAGNOSIS — R112 Nausea with vomiting, unspecified: Secondary | ICD-10-CM | POA: Diagnosis not present

## 2019-06-29 NOTE — Telephone Encounter (Signed)
Pt is not scheduled for an appointment with MR this morning. His OV is scheduled for 07/03/2019 with MR. We can't order oxygen for the pt until he is seen in our office.  ATC [place], I did not get answer and I could not leave a message.

## 2019-06-30 DIAGNOSIS — D689 Coagulation defect, unspecified: Secondary | ICD-10-CM | POA: Diagnosis not present

## 2019-06-30 DIAGNOSIS — E1129 Type 2 diabetes mellitus with other diabetic kidney complication: Secondary | ICD-10-CM | POA: Diagnosis not present

## 2019-06-30 DIAGNOSIS — N186 End stage renal disease: Secondary | ICD-10-CM | POA: Diagnosis not present

## 2019-06-30 DIAGNOSIS — Z992 Dependence on renal dialysis: Secondary | ICD-10-CM | POA: Diagnosis not present

## 2019-06-30 DIAGNOSIS — D631 Anemia in chronic kidney disease: Secondary | ICD-10-CM | POA: Diagnosis not present

## 2019-06-30 DIAGNOSIS — N2581 Secondary hyperparathyroidism of renal origin: Secondary | ICD-10-CM | POA: Diagnosis not present

## 2019-06-30 DIAGNOSIS — D509 Iron deficiency anemia, unspecified: Secondary | ICD-10-CM | POA: Diagnosis not present

## 2019-07-02 ENCOUNTER — Institutional Professional Consult (permissible substitution): Payer: Medicare Other | Admitting: Pulmonary Disease

## 2019-07-02 NOTE — Telephone Encounter (Signed)
LMTCB x1 for [place].

## 2019-07-03 ENCOUNTER — Institutional Professional Consult (permissible substitution): Payer: Medicare Other | Admitting: Internal Medicine

## 2019-07-03 DIAGNOSIS — N2581 Secondary hyperparathyroidism of renal origin: Secondary | ICD-10-CM | POA: Diagnosis not present

## 2019-07-03 DIAGNOSIS — E1129 Type 2 diabetes mellitus with other diabetic kidney complication: Secondary | ICD-10-CM | POA: Diagnosis not present

## 2019-07-03 DIAGNOSIS — D631 Anemia in chronic kidney disease: Secondary | ICD-10-CM | POA: Diagnosis not present

## 2019-07-03 DIAGNOSIS — D689 Coagulation defect, unspecified: Secondary | ICD-10-CM | POA: Diagnosis not present

## 2019-07-03 DIAGNOSIS — D509 Iron deficiency anemia, unspecified: Secondary | ICD-10-CM | POA: Diagnosis not present

## 2019-07-03 DIAGNOSIS — N186 End stage renal disease: Secondary | ICD-10-CM | POA: Diagnosis not present

## 2019-07-03 DIAGNOSIS — Z992 Dependence on renal dialysis: Secondary | ICD-10-CM | POA: Diagnosis not present

## 2019-07-03 NOTE — Telephone Encounter (Signed)
Pt's appointment with MR has been moved to 07/13/2019. Oxygen needs will be addressed at that time.  LMTCB x2 for Blake [name].

## 2019-07-04 DIAGNOSIS — J9610 Chronic respiratory failure, unspecified whether with hypoxia or hypercapnia: Secondary | ICD-10-CM | POA: Diagnosis not present

## 2019-07-04 NOTE — Telephone Encounter (Signed)
We have attempted to contact [place] several times with no success or call back from her. Per triage protocol, message will be closed.

## 2019-07-05 DIAGNOSIS — D631 Anemia in chronic kidney disease: Secondary | ICD-10-CM | POA: Diagnosis not present

## 2019-07-05 DIAGNOSIS — D509 Iron deficiency anemia, unspecified: Secondary | ICD-10-CM | POA: Diagnosis not present

## 2019-07-05 DIAGNOSIS — N186 End stage renal disease: Secondary | ICD-10-CM | POA: Diagnosis not present

## 2019-07-05 DIAGNOSIS — D689 Coagulation defect, unspecified: Secondary | ICD-10-CM | POA: Diagnosis not present

## 2019-07-05 DIAGNOSIS — N2581 Secondary hyperparathyroidism of renal origin: Secondary | ICD-10-CM | POA: Diagnosis not present

## 2019-07-05 DIAGNOSIS — Z992 Dependence on renal dialysis: Secondary | ICD-10-CM | POA: Diagnosis not present

## 2019-07-05 DIAGNOSIS — E1129 Type 2 diabetes mellitus with other diabetic kidney complication: Secondary | ICD-10-CM | POA: Diagnosis not present

## 2019-07-06 ENCOUNTER — Other Ambulatory Visit: Payer: Self-pay

## 2019-07-06 ENCOUNTER — Encounter: Payer: Self-pay | Admitting: Cardiology

## 2019-07-06 ENCOUNTER — Ambulatory Visit (INDEPENDENT_AMBULATORY_CARE_PROVIDER_SITE_OTHER): Payer: Medicare Other | Admitting: Cardiology

## 2019-07-06 VITALS — BP 124/60 | HR 76 | Temp 97.3°F | Ht 68.0 in | Wt 170.0 lb

## 2019-07-06 DIAGNOSIS — I2510 Atherosclerotic heart disease of native coronary artery without angina pectoris: Secondary | ICD-10-CM

## 2019-07-06 DIAGNOSIS — N186 End stage renal disease: Secondary | ICD-10-CM

## 2019-07-06 DIAGNOSIS — I214 Non-ST elevation (NSTEMI) myocardial infarction: Secondary | ICD-10-CM | POA: Diagnosis not present

## 2019-07-06 DIAGNOSIS — Z9861 Coronary angioplasty status: Secondary | ICD-10-CM

## 2019-07-06 DIAGNOSIS — E119 Type 2 diabetes mellitus without complications: Secondary | ICD-10-CM | POA: Diagnosis not present

## 2019-07-06 DIAGNOSIS — R627 Adult failure to thrive: Secondary | ICD-10-CM

## 2019-07-06 DIAGNOSIS — Z794 Long term (current) use of insulin: Secondary | ICD-10-CM

## 2019-07-06 NOTE — Assessment & Plan Note (Signed)
LAD and CFX PCI with DES Aug 2020 Normal LVF

## 2019-07-06 NOTE — Patient Instructions (Signed)
Medication Instructions:  Your physician recommends that you continue on your current medications as directed. Please refer to the Current Medication list given to you today. *If you need a refill on your cardiac medications before your next appointment, please call your pharmacy*  Lab Work: None  If you have labs (blood work) drawn today and your tests are completely normal, you will receive your results only by: Marland Kitchen MyChart Message (if you have MyChart) OR . A paper copy in the mail If you have any lab test that is abnormal or we need to change your treatment, we will call you to review the results.  Testing/Procedures: None   Follow-Up: At [facility], you and your health needs are our priority.  As part of our continuing mission to provide you with exceptional heart care, we have created designated Provider Care Teams.  These Care Teams include your primary Cardiologist (physician) and Advanced Practice Providers (APPs -  Physician Assistants and Nurse Practitioners) who all work together to provide you with the care you need, when you need it.  Your next appointment:   3 months  The format for your next appointment:   In Person  Provider:   Kerin Ransom, PA-C  Other Instructions

## 2019-07-06 NOTE — Assessment & Plan Note (Signed)
Admitted 9/26-/06/15/2019- GI evaluation as OP suggested. He appears to be doing better after two weeks in rehab.

## 2019-07-06 NOTE — Progress Notes (Signed)
Cardiology Office Note:    Date:  07/06/2019   ID:  [name], DOB December 12, 1945, MRN [id number]  PCP:  [name], MD  Cardiologist:  Kirk Ruths, MD  Electrophysiologist:  None   Referring MD: [name], MD   No chief complaint on file.   History of Present Illness:    Blake [name] is a 73 y.o.AA male with a hx of CAD- s/p PCI/DES to LAD and CFX  05/01/2019 after he presented with a NSTEMI.  Echo showed normal LVF.  Other medical problems incleude ESRD on HD TTS, HTN, and IDDM. The patient was discharged 05/04/2019 and presented back to the hospital 06/09/2019 with fatigue and atypical chest pain. Work up suggested the patient had failure to thrive syndrome with protein calorie malnutrition, anemia, and weight loss.  His chest pain was felt to be atypical-possibly be related to GI issues-esophagitis.  He was discharged to [facility] 06/15/2019 and is in the office today for follow up.   He was in SNF for two weeks.  He is now back home, his niece lives with him.  He says he is doing OK at home-he uses O2 PRN. He denies any chest discomfort but its noted that his presentation in August with his NSTEMI was syncope- he never had angina. He tells me he is eating and drinking well, no trouble swallowing if he takes his time.  He says there are no plans currently for GI follow up.  His labs are followed at dialysis.    Past Medical History:  Diagnosis Date  . Amputation of right great toe ([place]) 08/02/2015  . Arthritis    "maybe RUE/hand" (02/08/2018)  . ESRD (end stage renal disease) on dialysis [facility])    "TTS; [place]" (02/08/2018)  . Hyperlipidemia   . Hypertension   . Type II diabetes mellitus ([place])     Past Surgical History:  Procedure Laterality Date  . AMPUTATION TOE Right 08/04/2015   Procedure: RIGHT GREAT TOE AMPUTATION;  Surgeon: [name], MD;  Location: [place];  Service: Orthopedics;  Laterality: Right;  . AV FISTULA PLACEMENT Right 07/05/2014    Procedure: ARTERIOVENOUS BRACHIALCEPHALIC (AV) FISTULA CREATION;  Surgeon: Conrad Franklin, MD;  Location: [place];  Service: Vascular;  Laterality: Right;  . AV FISTULA PLACEMENT Right 04/24/2019   Procedure: REVISION RIGHT ARM ARTERIOVENOUS (AV) FISTULA;  Surgeon: Serafina Mitchell, MD;  Location: [place];  Service: Vascular;  Laterality: Right;  . BACK SURGERY    . CORONARY STENT INTERVENTION N/A 05/01/2019   Procedure: CORONARY STENT INTERVENTION;  Surgeon: Martinique, Peter M, MD;  Location: [place] CV LAB;  Service: Cardiovascular;  Laterality: N/A;  . ESOPHAGOGASTRODUODENOSCOPY (EGD) WITH PROPOFOL N/A 11/28/2015   Procedure: ESOPHAGOGASTRODUODENOSCOPY (EGD) WITH PROPOFOL;  Surgeon: [name], MD;  Location: [facility];  Service: Endoscopy;  Laterality: N/A;  . INSERTION OF DIALYSIS CATHETER Left 07/05/2014   Procedure: INSERTION OF DIALYSIS CATHETER-LEFT INTERNAL JUGULAR PLACEMENT;  Surgeon: Conrad Atwater, MD;  Location: [place];  Service: Vascular;  Laterality: Left;  . INSERTION OF DIALYSIS CATHETER N/A 04/30/2019   Procedure: INSERTION OF DIALYSIS CATHETER;  Surgeon: Marty Heck, MD;  Location: [facility] OR;  Service: Vascular;  Laterality: N/A;  . LEFT HEART CATH AND CORONARY ANGIOGRAPHY N/A 05/01/2019   Procedure: LEFT HEART CATH AND CORONARY ANGIOGRAPHY;  Surgeon: Martinique, Peter M, MD;  Location: [place] CV LAB;  Service: Cardiovascular;  Laterality: N/A;  . LUMBAR [place]    .  REMOVAL OF A DIALYSIS CATHETER Right 07/05/2014   Procedure: REMOVAL OF A DIALYSIS CATHETER;  Surgeon: Conrad King, MD;  Location: [place];  Service: Vascular;  Laterality: Right;  . ULTRASOUND GUIDANCE FOR VASCULAR ACCESS  04/30/2019   Procedure: Ultrasound Guidance For Vascular Access;  Surgeon: Marty Heck, MD;  Location: [place];  Service: Vascular;;    Current Medications: Current Meds  Medication Sig  . aspirin EC 81 MG tablet Take 81 mg by mouth daily.  Marland Kitchen atorvastatin (LIPITOR) 40 MG  tablet Take 1 tablet (40 mg total) by mouth at bedtime.  . B Complex-C-Folic Acid (NEPHRO VITAMINS) 0.8 MG TABS Take 0.8 mg by mouth daily.   . insulin regular (NOVOLIN R RELION) 100 units/mL injection Inject 0-0.09 mLs (0-9 Units total) into the skin 3 (three) times daily with meals. CBG < 70: implement hypoglycemia protocol CBG 70 - 120: 0 units CBG 121 - 150: 1 unit CBG 151 - 200: 2 units CBG 201 - 250: 3 units CBG 251 - 300: 5 units CBG 301 - 350: 7 units CBG 351 - 400: 9 units CBG > 400: call MD.  . metoprolol tartrate (LOPRESSOR) 25 MG tablet Take 0.5 tablets (12.5 mg total) by mouth 2 (two) times daily.  . Nutritional Supplements (FEEDING SUPPLEMENT, NEPRO CARB STEADY,) LIQD Take 237 mLs by mouth 2 (two) times daily between meals.  . pantoprazole (PROTONIX) 40 MG tablet Take 40 mg by mouth 2 (two) times daily.   Marland Kitchen PARoxetine (PAXIL) 10 MG tablet Take 10 mg by mouth daily.  . sevelamer carbonate (RENVELA) 800 MG tablet Take 800 mg by mouth 3 (three) times daily with meals.  . sodium zirconium cyclosilicate (LOKELMA) 10 g PACK packet Take 10 g by mouth daily.  . ticagrelor (BRILINTA) 90 MG TABS tablet Take 90 mg by mouth 2 (two) times daily.     Allergies:   Patient has no known allergies.   Social History   Socioeconomic History  . Marital status: Single    Spouse name: Not on file  . Number of children: Not on file  . Years of education: Not on file  . Highest education level: Not on file  Occupational History  . Not on file  Social Needs  . Financial resource strain: Not on file  . Food insecurity    Worry: Not on file    Inability: Not on file  . Transportation needs    Medical: Not on file    Non-medical: Not on file  Tobacco Use  . Smoking status: Former Smoker    Packs/day: 0.50    Years: 5.00    Pack years: 2.50    Types: Cigarettes    Quit date: 08/24/1979    Years since quitting: 39.8  . Smokeless tobacco: Never Used  Substance and Sexual Activity  .  Alcohol use: Not Currently    Alcohol/week: 0.0 standard drinks    Comment: 02/08/2018 "stopped in my 50's"  . Drug use: Never  . Sexual activity: Not Currently  Lifestyle  . Physical activity    Days per week: Not on file    Minutes per session: Not on file  . Stress: Not on file  Relationships  . Social Herbalist on phone: Not on file    Gets together: Not on file    Attends religious service: Not on file    Active member of club or organization: Not on file    Attends meetings of  clubs or organizations: Not on file    Relationship status: Not on file  Other Topics Concern  . Not on file  Social History Narrative  . Not on file     Family History: The patient's family history includes Diabetes in his father; Heart attack in his father.  ROS:   Please see the history of present illness.     All other systems reviewed and are negative.  EKGs/Labs/Other Studies Reviewed:    The following studies were reviewed today: Cath/PCI 05/01/2019 Echo 04/30/2019  EKG:  EKG is ordered today.  The ekg ordered today demonstrates NSR- HR 76, NSST changes (TWI V1)  Recent Labs: 04/30/2019: TSH 0.436 05/02/2019: Magnesium 2.1 06/09/2019: B Natriuretic Peptide 747.0 06/12/2019: ALT 24 06/13/2019: BUN 80; Creatinine, Ser 11.73; Hemoglobin 9.2; Platelets 158; Potassium 4.6; Sodium 135  Recent Lipid Panel    Component Value Date/Time   CHOL 106 07/03/2014 0212   TRIG 126 07/03/2014 0212   HDL 32 (L) 07/03/2014 0212   CHOLHDL 3.3 07/03/2014 0212   VLDL 25 07/03/2014 0212   LDLCALC 49 07/03/2014 0212    Physical Exam:    VS:  BP 124/60   Pulse 76   Temp (!) 97.3 F (36.3 C) (Temporal)   Ht 5\' 8"  (1.727 m)   Wt 170 lb (77.1 kg)   SpO2 95%   BMI 25.85 kg/m     Wt Readings from Last 3 Encounters:  07/06/19 170 lb (77.1 kg)  06/15/19 166 lb 14.2 oz (75.7 kg)  05/06/19 183 lb 13.8 oz (83.4 kg)     GEN:  well developed AA male, in wheel chair,  in no acute  distress HEENT: Normal NECK: No JVD; No carotid bruits LYMPHATICS: No lymphadenopathy CARDIAC: RRR, no murmurs, rubs, gallops RESPIRATORY:  Few crackles Rt base, external dialysis catheter noted rt chest ABDOMEN: Soft, non-tender, non-distended MUSCULOSKELETAL:  No edema; AVF RUE SKIN: Warm and dry NEUROLOGIC:  Alert and oriented x 3 PSYCHIATRIC:  Normal affect   ASSESSMENT:    NSTEMI (non-ST elevated myocardial infarction) [facility]) NSTEMI Aug 2020  CAD S/P PCI LAD and CFX PCI with DES Aug 2020 Normal LVF  ESRD (end stage renal disease) ([place]) HD T-Th-Sat- he is to start using his RUE AVF Saturday  Failure to thrive in adult Admitted 9/26-/06/15/2019- GI evaluation as OP suggested. He appears to be doing better after two weeks in rehab.   Insulin dependent type 2 diabetes mellitus ([name]) Contniues on insulin  PLAN:    No change in cardiac Rx.  F/U in 3 months.    Medication Adjustments/Labs and Tests Ordered: Current medicines are reviewed at length with the patient today.  Concerns regarding medicines are outlined above.  Orders Placed This Encounter  Procedures  . EKG 12-Lead   No orders of the defined types were placed in this encounter.   Patient Instructions  Medication Instructions:  Your physician recommends that you continue on your current medications as directed. Please refer to the Current Medication list given to you today. *If you need a refill on your cardiac medications before your next appointment, please call your pharmacy*  Lab Work: None  If you have labs (blood work) drawn today and your tests are completely normal, you will receive your results only by: Marland Kitchen MyChart Message (if you have MyChart) OR . A paper copy in the mail If you have any lab test that is abnormal or we need to change your treatment, we will call you to review  the results.  Testing/Procedures: None   Follow-Up: At [facility], you and your health needs are our priority.   As part of our continuing mission to provide you with exceptional heart care, we have created designated Provider Care Teams.  These Care Teams include your primary Cardiologist (physician) and Advanced Practice Providers (APPs -  Physician Assistants and Nurse Practitioners) who all work together to provide you with the care you need, when you need it.  Your next appointment:   3 months  The format for your next appointment:   In Person  Provider:   Kerin Ransom, PA-C  Other Instructions     Signed, Kerin Ransom, PA-C  07/06/2019 9:24 AM    [place]

## 2019-07-06 NOTE — Assessment & Plan Note (Signed)
Contniues on insulin

## 2019-07-06 NOTE — Assessment & Plan Note (Signed)
NSTEMI Aug 2020

## 2019-07-06 NOTE — Assessment & Plan Note (Signed)
HD T-Th-Sat- he is to start using his RUE AVF Saturday

## 2019-07-07 DIAGNOSIS — D509 Iron deficiency anemia, unspecified: Secondary | ICD-10-CM | POA: Diagnosis not present

## 2019-07-07 DIAGNOSIS — Z992 Dependence on renal dialysis: Secondary | ICD-10-CM | POA: Diagnosis not present

## 2019-07-07 DIAGNOSIS — D631 Anemia in chronic kidney disease: Secondary | ICD-10-CM | POA: Diagnosis not present

## 2019-07-07 DIAGNOSIS — E1129 Type 2 diabetes mellitus with other diabetic kidney complication: Secondary | ICD-10-CM | POA: Diagnosis not present

## 2019-07-07 DIAGNOSIS — D689 Coagulation defect, unspecified: Secondary | ICD-10-CM | POA: Diagnosis not present

## 2019-07-07 DIAGNOSIS — N2581 Secondary hyperparathyroidism of renal origin: Secondary | ICD-10-CM | POA: Diagnosis not present

## 2019-07-07 DIAGNOSIS — N186 End stage renal disease: Secondary | ICD-10-CM | POA: Diagnosis not present

## 2019-07-13 ENCOUNTER — Ambulatory Visit (INDEPENDENT_AMBULATORY_CARE_PROVIDER_SITE_OTHER): Payer: Medicare Other

## 2019-07-13 ENCOUNTER — Ambulatory Visit (INDEPENDENT_AMBULATORY_CARE_PROVIDER_SITE_OTHER): Payer: Medicare Other | Admitting: Internal Medicine

## 2019-07-13 ENCOUNTER — Encounter: Payer: Self-pay | Admitting: Internal Medicine

## 2019-07-13 ENCOUNTER — Other Ambulatory Visit: Payer: Self-pay

## 2019-07-13 DIAGNOSIS — R0609 Other forms of dyspnea: Secondary | ICD-10-CM

## 2019-07-13 DIAGNOSIS — J9611 Chronic respiratory failure with hypoxia: Secondary | ICD-10-CM

## 2019-07-13 DIAGNOSIS — R0600 Dyspnea, unspecified: Secondary | ICD-10-CM

## 2019-07-13 DIAGNOSIS — R9389 Abnormal findings on diagnostic imaging of other specified body structures: Secondary | ICD-10-CM

## 2019-07-13 NOTE — Patient Instructions (Addendum)
Discuss the timing for your protonix (pantoprazole) with your dialysis team  Please remember to go to the  x-ray department  for your tests - we will call you with the results when they are available    Please schedule a follow up office visit in 2 weeks (has to be a Friday) , sooner if needed  with all medications /inhalers/ solutions in hand so we can verify exactly what you are taking. This includes all medications from all doctors and over the counters

## 2019-07-13 NOTE — Progress Notes (Signed)
Blake [name], male    DOB: 05/16/1946,    MRN: [id number]   Brief patient profile:  89 yobm quit smoking 1980 with new sob 02/2017 about the same time HD started but denies better sob before vs after HD with  Abn CT chest so referred to pulmonary clinic 07/13/2019 by Dr   Blake [name] s/p admit:   Admit Date: 06/09/2019 Discharge date: 06/15/2019  Recommendations for Outpatient Follow-up:  1. Follow up with PCP in 1-2 weeks 2. Please obtain BMP/CBC in one week 3. Please ensure follow-up with cardiology, gastroenterology, nephrology and with his hemodialysis clinic.  4. Consider outpatient pulmonology evaluation (if not already completed)-patient appears to have incidental finding of mediastinal and hilar lymphadenopathy on CT angiogram of the chest (per radiology these are stable findings)    Diet recommendation:     Diet Order                  Diet - low sodium heart healthy         Diet Carb Modified         Diet renal/carb modified with fluid restriction Diet-HS Snack? Nothing; Fluid restriction: 1200 mL Fluid; Room service appropriate? Yes; Fluid consistency: Thin  Diet effective now                Brief Summary: See H&P, Labs, Consult and Test reports for all details in brief, Patient is a58 y.o.malewith history of ESRD on HD TTS, CAD s/p PCI 1 month back, HTN, IDDM-who presented with fatigue and atypical chest pain.  Brief Hospital Course: Failure to thrive syndrome/protein calorie malnutrition/weight loss:No CT evidence of malignancy-evaluated by [name]-since patient recently had a PCI and is on dual antiplatelet agents-GI recommending further endoscopic evaluation to be done in the outpatient setting.   Chest pain: Felt to be atypical-could be related to GI issues-esophagitis-continue PPI. Has history of CAD-see below  History of CAD-with recent non-STEMI in August 2020 requiring DES to circumflex and LAD:No anginal symptoms-denies any chest  pain or shortness of breath this morning.Remains on dual antiplatelet agents with aspirin/Plavix along with beta-blocker and statin.  Note-chart reviewed with cardiology on-call Blake [name]-looks like patient was switched over to Plavix from [place] right before his discharge from the hospital in August.  Okay to continue with aspirin/Plavix-cardiology will arrange for follow-up.  ESRD on HD TTS: Nephrology following for hemodialysis.  Please resume HD at his usual schedule.  DM-2:CBG stable-continue with SSI  Anemia:Likely related to ESRD-stable-continue to follow closely.  GERD/esophagitis:Continue PPI.  Unstageable pressure injury left lateral ankle, POA  Debility/deconditioning: Per patient at baseline-he uses a cane to get around the house. He also has a wheelchair and a walker. Suspect worsening of his deconditioning is due to weight loss/acute illness-spoke with case management/social work today-they are working on [employer] placement.   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)   Dyspnea    History of Present Illness  07/13/2019  Pulmonary/ 1st office eval/Blake [name]  Chief Complaint  Patient presents with  . Pulmonary Consult    Referred by Blake [name] for abnormal CT Chest. Pt c/o SOB x 2 yrs.   Dyspnea:  Room to room fine with 02, walks to truck ok s 02, uses [facility] parking  and rides scooter at store and gets in w/c on arrival to HD and doctor's offices  all s wearing 02 (he's not sure what he has at home for portable)  Cough: denies   Sleep: flat / prone/ one pillow SABA use: none 02 2lpm hs  And prn daytime but does not check sats   No obvious day to day or daytime variability or assoc excess/ purulent sputum or mucus plugs or hemoptysis or cp or chest tightness, subjective wheeze or overt sinus or hb symptoms.   Sleeping   without nocturnal  or early am exacerbation  of respiratory  c/o's or need for noct saba. Also denies any obvious fluctuation of symptoms with weather or environmental changes or other aggravating or alleviating factors except as outlined above   No unusual exposure hx or h/o childhood pna/ asthma or knowledge of premature birth.  Current Allergies, Complete Past Medical History, Past Surgical History, Family History, and Social History were reviewed in [employer] record.  ROS  The following are not active complaints unless bolded Hoarseness, sore throat, dysphagia, dental problems, itching, sneezing,  nasal congestion or discharge of excess mucus or purulent secretions, ear ache,   fever, chills, sweats, unintended wt loss or wt gain, classically pleuritic or exertional cp,  orthopnea pnd or arm/hand swelling  or leg swelling, presyncope, palpitations, abdominal pain, anorexia, nausea, vomiting, diarrhea  or change in bowel habits or change in bladder habits, change in stools or change in urine, dysuria, hematuria,  rash, arthralgias, visual complaints, headache, numbness, weakness or ataxia or problems with walking or coordination,  change in mood or  memory.           Past Medical History:  Diagnosis Date  . Amputation of right great toe ([place]) 08/02/2015  . Arthritis    "maybe RUE/hand" (02/08/2018)  . ESRD (end stage renal disease) on dialysis [facility])    "TTS; [place]" (02/08/2018)  . Hyperlipidemia   . Hypertension   . Type II diabetes mellitus ([place])     Outpatient Medications Prior to Visit  Medication Sig Dispense Refill  . aspirin EC 81 MG tablet Take 81 mg by mouth daily.    Marland Kitchen atorvastatin (LIPITOR) 40 MG tablet Take 1 tablet (40 mg total) by mouth at bedtime. 30 tablet 2  . B Complex-C-Folic Acid (NEPHRO VITAMINS) 0.8 MG TABS Take 0.8 mg by mouth daily.     . clopidogrel (PLAVIX) 75 MG tablet Take 75 mg by mouth daily.    . insulin regular (NOVOLIN R  RELION) 100 units/mL injection Inject 0-0.09 mLs (0-9 Units total) into the skin 3 (three) times daily with meals. CBG < 70: implement hypoglycemia protocol CBG 70 - 120: 0 units CBG 121 - 150: 1 unit CBG 151 - 200: 2 units CBG 201 - 250: 3 units CBG 251 - 300: 5 units CBG 301 - 350: 7 units CBG 351 - 400: 9 units CBG > 400: call MD. 10 mL 11  . metoprolol tartrate (LOPRESSOR) 25 MG tablet Take 0.5 tablets (12.5 mg total) by mouth 2 (two) times daily. 30 tablet 2  . pantoprazole (PROTONIX) 40 MG tablet Take 40 mg by mouth 2 (two) times daily.     Marland Kitchen PARoxetine (PAXIL) 10 MG tablet Take 10 mg by mouth daily.    . sevelamer carbonate (RENVELA) 800 MG tablet Take 800 mg by mouth 3 (three) times daily with meals.    . sodium zirconium cyclosilicate (LOKELMA) 10 g PACK packet Take 10 g by mouth daily.    . ticagrelor (BRILINTA) 90 MG TABS tablet Take  90 mg by mouth 2 (two) times daily.    . Nutritional Supplements (FEEDING SUPPLEMENT, NEPRO CARB STEADY,) LIQD Take 237 mLs by mouth 2 (two) times daily between meals.  0      Objective:     BP 140/60 (BP Location: Left Arm, Cuff Size: Normal)   Pulse 68   Temp 97.6 F (36.4 C) (Temporal)   Ht 5\' 8"  (1.727 m)   Wt 169 lb (76.7 kg)   SpO2 95% Comment: on RA  BMI 25.70 kg/m   SpO2: 95 %(on RA)   Wt Readings from Last 3 Encounters:  07/13/19 169 lb (76.7 kg)  07/06/19 170 lb (77.1 kg)  06/15/19 166 lb 14.2 oz (75.7 kg)   02/16/18             208     W/c bound bm >>> stated age, more frail looking than sob easily confused with details of care   HEENT : pt wearing mask not removed for exam due to covid -19 concerns.    NECK :  without JVD/Nodes/TM/ nl carotid upstrokes bilaterally   LUNGS: no acc muscle use,  Nl contour chest with insp crackles in bases  bilaterally without cough on insp or exp maneuvers   CV:  RRR  no s3   II/VI SEM  s  increase in P2, and  Trace to 1+ pitting both LE's  ABD:  soft and nontender with nl  inspiratory excursion in the supine position. No bruits or organomegaly appreciated, bowel sounds nl  MS:  Nl gait/ ext warm without deformities, calf tenderness, cyanosis or clubbing No obvious joint restrictions   SKIN: warm and dry without lesions    NEURO:  alert, approp, nl sensorium with  no motor or cerebellar deficits apparent.    CXR PA and Lateral:   07/13/2019 :   Done p last HD 10/27 and missing HD 10/29 I personally reviewed images and agree with radiology impression as follows: 1. Known chronic lung disease. 2. Increased interstitial markings in the lungs could represent a chronic changes, more conspicuous due to difference in technique. A superimposed infectious or edematous process could have a similar Appearance. My review:  Waxing and waining interstitial changes last resolved on Feb 09 2019 cxr c/w resolving edema    I personally reviewed images and agree with radiology impression as follows:   Chest CT 06/10/2019 Mediastinum/Nodes: Stable mildly enlarged mediastinal lymph nodes and enlarged right hilar lymph nodes x 2.6   Lungs/Pleura: Stable chronic lung disease with more prominent disease in the right lung compared to the left. It would be difficult to exclude subtle areas infection but overall appearance of both lungs appears very similar since the prior CTA. Probable component of mild chronic pulmonary interstitial edema. No pleural effusions. No pneumothorax. No pulmonary masses.        Assessment   DOE (dyspnea on exertion) Onset 2008 coincident with ESRF but no reported change before vs after HD  - Echo 04/30/19:The left ventricle has normal systolic function with an ejection fraction of 60-65%. The cavity size was normal. Left ventricular diastolic Doppler parameters are consistent with pseudonormalization -  LHC 05/01/2019 with CAD >stents with nl LVEDP  -  CT 06/10/19 mild adenopathy and R > L  ILD vs edema  - 07/13/2019   Walked RA walked approx 125  ft  at a slow pace with a cane and was unsteady/stopped due to SOB and sats were 95%   He may have a  mild chronic background ild pattern cxr on 02/09/2019 during admit when we could control intake/ output resulted in normalization of his "ILD" if not his sob and he does not desaturate walking though clearly quite deconditioned so will not plan on additional w/u today.  Instead rec return one day after HD fro repeat cxr/ amb sats and regroup then re further w/u.     Chronic respiratory failure with hypoxia ([name]) rx = 2lpm hs and around the house as of 07/13/2019 but did not qualify for amb 02   Stopped due to sob/fatigue at slow pace and sats still 94% today p missing last HD so doubt amb 02 would do anything but slow him down further as somewhat unsteady with cane and doubt he could carry it anyway.  >>>   recs = 2lpm hs and around the house with goal of keeping > 90%     Abnormal CT of the chest  Chest CT 06/10/2019 Mediastinum/Nodes: Stable mildly enlarged mediastinal lymph nodes and enlarged right hilar lymph nodes x 2.6 plus ? Underlying ILD vs edema   Suspect much of what we're seeing on ct including adenopathy is from chronic volume overload from esrf but can't rule out ILD assoc with adenopathy which would include  lymphangitic carcinomatosis much more likely than sarcoid or other granumatous dz clinically as he does have assoc wt loss/ FTT.   However,  Risks of interventions including ebus are relatively high here with benefits questionable in this man who appears at least 20-15 y older than stated age and apparently nearing NHP per notes so will set him up for serial f/u with cxr/ walking sats one day p hd in 2 weeks and regroup then.     Total time devoted to counseling  > 50 % of initial 60 min office visit:  reviewed case hex with pt/  directly observed portions of ambulatory 02 saturation study/ discussion of options/alternatives/ personally creating written customized  instructions  in presence of pt  then going over those specific  Instructions directly with the pt including how to use all of the meds but in particular covering each new medication in detail and the difference between the maintenance= "automatic" meds and the prns using an action plan format for the latter (If this problem/symptom => do that organization reading Left to right).  Please see AVS from this visit for a full list of these instructions which I personally wrote for this pt and  are unique to this visit.    Christinia Gully, MD 07/13/2019

## 2019-07-14 ENCOUNTER — Other Ambulatory Visit: Payer: Self-pay

## 2019-07-14 ENCOUNTER — Emergency Department (HOSPITAL_COMMUNITY)
Admission: EM | Admit: 2019-07-14 | Discharge: 2019-07-14 | Disposition: A | Discharge status: Home / Self Care | Payer: Medicare Other | Attending: Emergency Medicine | Admitting: Emergency Medicine

## 2019-07-14 ENCOUNTER — Encounter (HOSPITAL_COMMUNITY): Payer: Self-pay | Admitting: Emergency Medicine

## 2019-07-14 ENCOUNTER — Encounter: Payer: Self-pay | Admitting: Internal Medicine

## 2019-07-14 DIAGNOSIS — W19XXXA Unspecified fall, initial encounter: Secondary | ICD-10-CM

## 2019-07-14 DIAGNOSIS — Z79899 Other long term (current) drug therapy: Secondary | ICD-10-CM | POA: Diagnosis not present

## 2019-07-14 DIAGNOSIS — Z87891 Personal history of nicotine dependence: Secondary | ICD-10-CM | POA: Insufficient documentation

## 2019-07-14 DIAGNOSIS — M25562 Pain in left knee: Secondary | ICD-10-CM | POA: Insufficient documentation

## 2019-07-14 DIAGNOSIS — Y939 Activity, unspecified: Secondary | ICD-10-CM | POA: Diagnosis not present

## 2019-07-14 DIAGNOSIS — Y999 Unspecified external cause status: Secondary | ICD-10-CM | POA: Diagnosis not present

## 2019-07-14 DIAGNOSIS — D509 Iron deficiency anemia, unspecified: Secondary | ICD-10-CM | POA: Diagnosis not present

## 2019-07-14 DIAGNOSIS — R52 Pain, unspecified: Secondary | ICD-10-CM | POA: Diagnosis not present

## 2019-07-14 DIAGNOSIS — R9389 Abnormal findings on diagnostic imaging of other specified body structures: Secondary | ICD-10-CM | POA: Insufficient documentation

## 2019-07-14 DIAGNOSIS — E1129 Type 2 diabetes mellitus with other diabetic kidney complication: Secondary | ICD-10-CM | POA: Diagnosis not present

## 2019-07-14 DIAGNOSIS — W010XXA Fall on same level from slipping, tripping and stumbling without subsequent striking against object, initial encounter: Secondary | ICD-10-CM | POA: Diagnosis not present

## 2019-07-14 DIAGNOSIS — I120 Hypertensive chronic kidney disease with stage 5 chronic kidney disease or end stage renal disease: Secondary | ICD-10-CM | POA: Diagnosis not present

## 2019-07-14 DIAGNOSIS — Y929 Unspecified place or not applicable: Secondary | ICD-10-CM | POA: Insufficient documentation

## 2019-07-14 DIAGNOSIS — I959 Hypotension, unspecified: Secondary | ICD-10-CM | POA: Diagnosis not present

## 2019-07-14 DIAGNOSIS — E1122 Type 2 diabetes mellitus with diabetic chronic kidney disease: Secondary | ICD-10-CM | POA: Insufficient documentation

## 2019-07-14 DIAGNOSIS — Z794 Long term (current) use of insulin: Secondary | ICD-10-CM | POA: Insufficient documentation

## 2019-07-14 DIAGNOSIS — Z992 Dependence on renal dialysis: Secondary | ICD-10-CM | POA: Insufficient documentation

## 2019-07-14 DIAGNOSIS — D689 Coagulation defect, unspecified: Secondary | ICD-10-CM | POA: Diagnosis not present

## 2019-07-14 DIAGNOSIS — Z7982 Long term (current) use of aspirin: Secondary | ICD-10-CM | POA: Diagnosis not present

## 2019-07-14 DIAGNOSIS — I499 Cardiac arrhythmia, unspecified: Secondary | ICD-10-CM | POA: Diagnosis not present

## 2019-07-14 DIAGNOSIS — R0902 Hypoxemia: Secondary | ICD-10-CM | POA: Diagnosis not present

## 2019-07-14 DIAGNOSIS — R531 Weakness: Secondary | ICD-10-CM | POA: Diagnosis not present

## 2019-07-14 DIAGNOSIS — N2581 Secondary hyperparathyroidism of renal origin: Secondary | ICD-10-CM | POA: Diagnosis not present

## 2019-07-14 DIAGNOSIS — N186 End stage renal disease: Secondary | ICD-10-CM | POA: Insufficient documentation

## 2019-07-14 DIAGNOSIS — D631 Anemia in chronic kidney disease: Secondary | ICD-10-CM | POA: Diagnosis not present

## 2019-07-14 NOTE — Assessment & Plan Note (Addendum)
Onset 2008 coincident with ESRF but no reported change before vs after HD  - Echo 04/30/19:The left ventricle has normal systolic function with an ejection fraction of 60-65%. The cavity size was normal. Left ventricular diastolic Doppler parameters are consistent with pseudonormalization -  LHC 05/01/2019 with CAD >stents with nl LVEDP  -  CT 06/10/19 mild adenopathy and R > L  ILD vs edema  - 07/13/2019   Walked RA walked approx 125 ft  at a slow pace with a cane and was unsteady/stopped due to SOB and sats were 95%   He may have a mild chronic background ild pattern cxr on 02/09/2019 during admit when we could control intake/ output resulted in normalization of his "ILD" if not his sob and he does not desaturate walking though clearly quite deconditioned so will not plan on additional w/u today.  Instead rec return one day after HD fro repeat cxr/ amb sats and regroup then re further w/u.

## 2019-07-14 NOTE — Assessment & Plan Note (Signed)
rx = 2lpm hs and around the house as of 07/13/2019 but did not qualify for amb 02   Stopped due to sob/fatigue at slow pace and sats still 94% today p missing last HD so doubt amb 02 would do anything but slow him down further as somewhat unsteady with cane and doubt he could carry it anyway.   >>>   recs = 2lpm hs and around the house with goal of keeping > 90%

## 2019-07-14 NOTE — Assessment & Plan Note (Signed)
Chest CT 06/10/2019 Mediastinum/Nodes: Stable mildly enlarged mediastinal lymph nodes and enlarged right hilar lymph nodes x 2.6 plus ? Underlying ILD vs edema   Suspect much of what we're seeing on ct including adenopathy is from chronic volume overload from esrf but can't rule out ILD assoc with adenopathy which would include  lymphangitic carcinomatosis much more likely than sarcoid or other granumatous dz clinically as he does have assoc wt loss/ FTT.   However,  Risks of interventions including ebus are relatively high here with benefits questionable in this man who appears at least 56-15 y older than stated age and apparently nearing NHP per notes so will set him up for serial f/u with cxr/ walking sats one day p hd in 2 weeks and regroup then.   Total time devoted to counseling  > 50 % of initial 60 min office visit:  reviewed case hex with pt/  directly observed portions of ambulatory 02 saturation study/ discussion of options/alternatives/ personally creating written customized instructions  in presence of pt  then going over those specific  Instructions directly with the pt including how to use all of the meds but in particular covering each new medication in detail and the difference between the maintenance= "automatic" meds and the prns using an action plan format for the latter (If this problem/symptom => do that organization reading Left to right).  Please see AVS from this visit for a full list of these instructions which I personally wrote for this pt and  are unique to this visit.

## 2019-07-14 NOTE — ED Triage Notes (Signed)
Per EMS pt was getting out of the car going to dialysis and had mechanical fall. Denies hitting head or LOC. A/o x4. Last dialysis treatment was 10/24

## 2019-07-14 NOTE — ED Provider Notes (Signed)
[place] EMERGENCY DEPARTMENT Provider Note   CSN: [id number] Arrival date & time: 07/14/19  1244     History   Chief Complaint Chief Complaint  Patient presents with  . Fall    HPI Blake [name] is a 73 y.o. male.     Patient went to dialysis today when he got out of the truck he fell and hurt his left knee.  Patient presently not complaining of any pain in his left knee and he did not hit his head  The history is provided by the patient. No language interpreter was used.  Fall This is a new problem. The current episode started less than 1 hour ago. The problem occurs rarely. The problem has been resolved. Pertinent negatives include no chest pain, no abdominal pain and no headaches. Nothing aggravates the symptoms. Nothing relieves the symptoms.    Past Medical History:  Diagnosis Date  . Amputation of right great toe ([place]) 08/02/2015  . Arthritis    "maybe RUE/hand" (02/08/2018)  . ESRD (end stage renal disease) on dialysis [facility])    "TTS; [place]" (02/08/2018)  . Hyperlipidemia   . Hypertension   . Type II diabetes mellitus [facility])     Patient Active Problem List   Diagnosis Date Noted  . Abnormal CT of the chest 07/14/2019  . CAD S/P PCI 07/06/2019  . Failure to thrive in adult 07/06/2019  . DOE (dyspnea on exertion) 06/10/2019  . Pulmonary edema 06/09/2019  . Chest pain 06/09/2019  . Pressure injury of skin 05/04/2019  . NSTEMI (non-ST elevated myocardial infarction) ([place]) 04/30/2019  . Acute encephalopathy 04/29/2019  . Fluid overload 12/08/2018  . Chronic respiratory failure with hypoxia ([place]) 02/24/2018  . Dyslipidemia associated with type 2 diabetes mellitus ([place]) 02/14/2018  . Type 2 diabetes mellitus with hypertension and end stage renal disease on dialysis ([place]) 02/14/2018  . GERD without esophagitis 02/14/2018  . Hypomagnesemia 02/08/2018  . Thrombocytopenia ([place]) 02/07/2018  . Sepsis ([place]) 02/07/2018  . Hypocalcemia  02/07/2018  . Hypokalemia 02/07/2018  . Nausea and vomiting 04/21/2016  . Hyperkalemia 04/21/2016  . Encephalopathy, metabolic [id number]  . Acute pulmonary edema ([name])   . 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 ([place]) 07/07/2014  . Depression 07/07/2014  . Neuropathic pain, leg, bilateral 07/07/2014  . Diabetic retinopathy ([place]) 07/07/2014  . Acute respiratory failure ([place]) 06/28/2014  . ESRD (end stage renal disease) ([place]) 06/27/2014  . Insulin dependent type 2 diabetes mellitus ([place]) 10/17/2012  . Hypoxia 10/17/2012  . Hypertension     Past Surgical History:  Procedure Laterality Date  . AMPUTATION TOE Right 08/04/2015   Procedure: RIGHT GREAT TOE AMPUTATION;  Surgeon: [name], MD;  Location: [employer];  Service: Orthopedics;  Laterality: Right;  . AV FISTULA PLACEMENT Right 07/05/2014   Procedure: ARTERIOVENOUS BRACHIALCEPHALIC (AV) FISTULA CREATION;  Surgeon: Conrad Greenwood, MD;  Location: [place];  Service: Vascular;  Laterality: Right;  . AV FISTULA PLACEMENT Right 04/24/2019   Procedure: REVISION RIGHT ARM ARTERIOVENOUS (AV) FISTULA;  Surgeon: Serafina Mitchell, MD;  Location: [place];  Service: Vascular;  Laterality: Right;  . BACK SURGERY    . CORONARY STENT INTERVENTION N/A 05/01/2019   Procedure: CORONARY STENT INTERVENTION;  Surgeon: Martinique, Peter M, MD;  Location: [place] CV LAB;  Service: Cardiovascular;  Laterality: N/A;  . ESOPHAGOGASTRODUODENOSCOPY (EGD) WITH PROPOFOL N/A 11/28/2015   Procedure: ESOPHAGOGASTRODUODENOSCOPY (EGD) WITH PROPOFOL;  Surgeon: [name], MD;  Location: [facility];  Service: Endoscopy;  Laterality: N/A;  . INSERTION OF DIALYSIS CATHETER Left 07/05/2014   Procedure: INSERTION OF DIALYSIS CATHETER-LEFT INTERNAL JUGULAR PLACEMENT;  Surgeon: Conrad [place], MD;  Location: [place];  Service: Vascular;  Laterality: Left;  . INSERTION OF DIALYSIS CATHETER N/A 04/30/2019   Procedure: INSERTION OF DIALYSIS CATHETER;   Surgeon: Marty Heck, MD;  Location: [facility] OR;  Service: Vascular;  Laterality: N/A;  . LEFT HEART CATH AND CORONARY ANGIOGRAPHY N/A 05/01/2019   Procedure: LEFT HEART CATH AND CORONARY ANGIOGRAPHY;  Surgeon: Martinique, Peter M, MD;  Location: [place] CV LAB;  Service: Cardiovascular;  Laterality: N/A;  . LUMBAR [place]    . REMOVAL OF A DIALYSIS CATHETER Right 07/05/2014   Procedure: REMOVAL OF A DIALYSIS CATHETER;  Surgeon: Conrad [place], MD;  Location: [place];  Service: Vascular;  Laterality: Right;  . ULTRASOUND GUIDANCE FOR VASCULAR ACCESS  04/30/2019   Procedure: Ultrasound Guidance For Vascular Access;  Surgeon: Marty Heck, MD;  Location: [place];  Service: Vascular;;        Home Medications    Prior to Admission medications   Medication Sig Start Date End Date Taking? Authorizing Provider  aspirin EC 81 MG tablet Take 81 mg by mouth daily.    [provider]  atorvastatin (LIPITOR) 40 MG tablet Take 1 tablet (40 mg total) by mouth at bedtime. 05/04/19 07/13/19  Barb Merino, MD  B Complex-C-Folic Acid (NEPHRO VITAMINS) 0.8 MG TABS Take 0.8 mg by mouth daily.     [provider]  clopidogrel (PLAVIX) 75 MG tablet Take 75 mg by mouth daily.    [provider]  insulin regular (NOVOLIN R RELION) 100 units/mL injection Inject 0-0.09 mLs (0-9 Units total) into the skin 3 (three) times daily with meals. CBG < 70: implement hypoglycemia protocol CBG 70 - 120: 0 units CBG 121 - 150: 1 unit CBG 151 - 200: 2 units CBG 201 - 250: 3 units CBG 251 - 300: 5 units CBG 301 - 350: 7 units CBG 351 - 400: 9 units CBG > 400: call MD. 06/15/19   Jonetta Osgood, MD  metoprolol tartrate (LOPRESSOR) 25 MG tablet Take 0.5 tablets (12.5 mg total) by mouth 2 (two) times daily. 05/04/19 07/13/19  Barb Merino, MD  pantoprazole (PROTONIX) 40 MG tablet Take 40 mg by mouth 2 (two) times daily.     [provider]  PARoxetine (PAXIL) 10 MG tablet Take  10 mg by mouth daily.    [provider]  sevelamer carbonate (RENVELA) 800 MG tablet Take 800 mg by mouth 3 (three) times daily with meals.    [provider]  sodium zirconium cyclosilicate (LOKELMA) 10 g PACK packet Take 10 g by mouth daily.    [provider]  ticagrelor (BRILINTA) 90 MG TABS tablet Take 90 mg by mouth 2 (two) times daily.    [provider]    Family History Family History  Problem Relation Age of Onset  . Diabetes Father   . Heart attack Father     Social History Social History   Tobacco Use  . Smoking status: Former Smoker    Packs/day: 0.50    Years: 5.00    Pack years: 2.50    Types: Cigarettes    Quit date: 08/24/1979    Years since quitting: 39.9  . Smokeless tobacco: Never Used  Substance Use Topics  . Alcohol use: Not Currently  Alcohol/week: 0.0 standard drinks    Comment: 02/08/2018 "stopped in my 50's"  . Drug use: Never     Allergies   Patient has no known allergies.   Review of Systems Review of Systems  Constitutional: Negative for appetite change and fatigue.       Fall  HENT: Negative for congestion, ear discharge and sinus pressure.   Eyes: Negative for discharge.  Respiratory: Negative for cough.   Cardiovascular: Negative for chest pain.  Gastrointestinal: Negative for abdominal pain and diarrhea.  Genitourinary: Negative for frequency and hematuria.  Musculoskeletal: Negative for back pain.  Skin: Negative for rash.  Neurological: Negative for seizures and headaches.  Psychiatric/Behavioral: Negative for hallucinations.     Physical Exam Updated Vital Signs BP 139/66 (BP Location: Left Arm)   Pulse (!) 59   Temp 97.7 F (36.5 C) (Oral)   Resp 15   Ht 5\' 8"  (1.727 m)   Wt 76.7 kg   SpO2 98%   BMI 25.70 kg/m   Physical Exam Vitals signs and nursing note reviewed.  Constitutional:      Appearance: He is well-developed.  HENT:     Head: Normocephalic.     Nose: Nose  normal.  Eyes:     General: No scleral icterus.    Conjunctiva/sclera: Conjunctivae normal.  Neck:     Musculoskeletal: Neck supple.     Thyroid: No thyromegaly.  Cardiovascular:     Rate and Rhythm: Normal rate and regular rhythm.     Heart sounds: No murmur. No friction rub. No gallop.   Pulmonary:     Breath sounds: No stridor. No wheezing or rales.  Chest:     Chest wall: No tenderness.  Abdominal:     General: There is no distension.     Tenderness: There is no abdominal tenderness. There is no rebound.  Musculoskeletal: Normal range of motion.  Lymphadenopathy:     Cervical: No cervical adenopathy.  Skin:    Findings: No erythema or rash.  Neurological:     Mental Status: He is oriented to person, place, and time.     Motor: No abnormal muscle tone.     Coordination: Coordination normal.  Psychiatric:        Behavior: Behavior normal.      ED Treatments / Results  Labs (all labs ordered are listed, but only abnormal results are displayed) Labs Reviewed - No data to display  EKG None  Radiology Dg Chest 2 View  Result Date: 07/13/2019 CLINICAL DATA:  Dyspnea on exertion. EXAM: CHEST - 2 VIEW COMPARISON:  June 09, 2019 FINDINGS: The double lumen right central line is stable terminating near the caval atrial junction. No pneumothorax. Bilateral interstitial pulmonary opacities are consistent with the chronic changes seen on recent CT imaging. The findings are mildly more conspicuous compared to the comparison CT scan from June 09, 2019. The findings are also worse, right greater than left. No other changes. IMPRESSION: 1. Known chronic lung disease. 2. Increased interstitial markings in the lungs could represent a chronic changes, more conspicuous due to difference in technique. A superimposed infectious or edematous process could have a similar appearance. Electronically Signed   By: Dorise Bullion III M.D   On: 07/13/2019 16:23    Procedures Procedures  (including critical care time)  Medications Ordered in ED Medications - No data to display   Initial Impression / Assessment and Plan / ED Course  I have reviewed the triage vital signs and the nursing  notes.  Pertinent labs & imaging results that were available during my care of the patient were reviewed by me and considered in my medical decision making (see chart for details).        Patient with fall.  No LOC.  Patient initially had left knee pain but that is resolved.  Mild contusion to left knee.  Patient will go back to dialysis center  Final Clinical Impressions(s) / ED Diagnoses   Final diagnoses:  Fall, initial encounter    ED Discharge Orders    None       Milton Ferguson, MD 07/14/19 1306

## 2019-07-14 NOTE — Discharge Instructions (Addendum)
Go to dialysis now.

## 2019-07-15 DIAGNOSIS — Z992 Dependence on renal dialysis: Secondary | ICD-10-CM | POA: Diagnosis not present

## 2019-07-15 DIAGNOSIS — E1122 Type 2 diabetes mellitus with diabetic chronic kidney disease: Secondary | ICD-10-CM | POA: Diagnosis not present

## 2019-07-15 DIAGNOSIS — N186 End stage renal disease: Secondary | ICD-10-CM | POA: Diagnosis not present

## 2019-07-16 NOTE — Progress Notes (Signed)
LMTCB

## 2019-07-17 DIAGNOSIS — R404 Transient alteration of awareness: Secondary | ICD-10-CM | POA: Diagnosis not present

## 2019-07-17 DIAGNOSIS — I499 Cardiac arrhythmia, unspecified: Secondary | ICD-10-CM | POA: Diagnosis not present

## 2019-07-17 DIAGNOSIS — R0689 Other abnormalities of breathing: Secondary | ICD-10-CM | POA: Diagnosis not present

## 2019-07-17 DIAGNOSIS — R000 Tachycardia, unspecified: Secondary | ICD-10-CM | POA: Diagnosis not present

## 2019-07-17 DIAGNOSIS — R001 Bradycardia, unspecified: Secondary | ICD-10-CM | POA: Diagnosis not present

## 2019-07-19 NOTE — Progress Notes (Signed)
LMTCB

## 2019-07-30 ENCOUNTER — Encounter: Payer: Self-pay | Admitting: *Deleted

## 2019-07-30 NOTE — Progress Notes (Signed)
LMTCB

## 2019-07-30 NOTE — Progress Notes (Signed)
Letter mailed

## 2019-08-03 ENCOUNTER — Ambulatory Visit: Payer: Medicare Other | Admitting: Internal Medicine

## 2019-08-14 DEATH — deceased

## 2019-10-11 ENCOUNTER — Ambulatory Visit: Payer: Medicare Other | Admitting: Cardiology

## 2019-10-12 ENCOUNTER — Ambulatory Visit: Payer: Medicare Other | Admitting: Cardiology

## 2021-06-27 IMAGING — DX PORTABLE CHEST - 1 VIEW
1 series · 1 of 1 positions shown · non-contrast
Comparison: Radiograph 12/10/2018

CLINICAL DATA: Dyspnea. Syncope. Found unresponsive in bathroom.
Missed dialysis.

EXAM:
PORTABLE CHEST 1 VIEW

[chest ap]
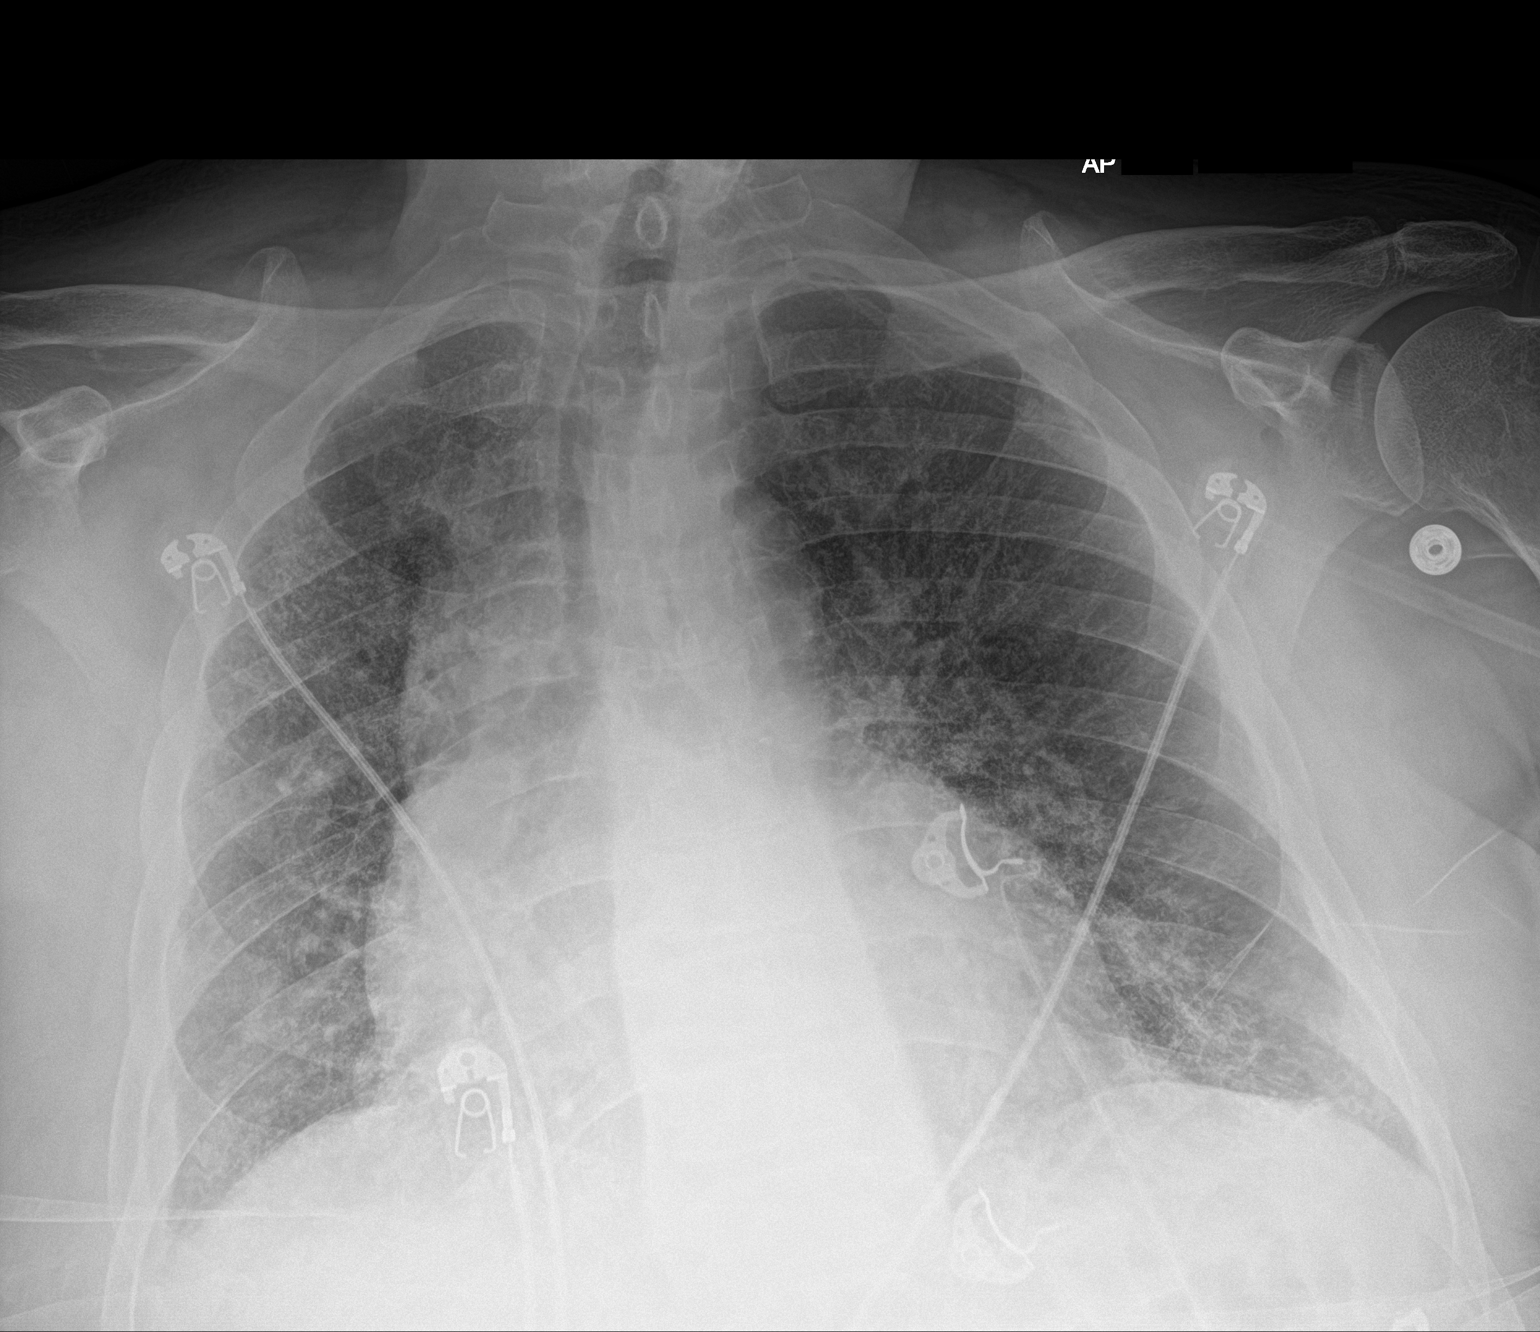

[1 of 1 positions shown; findings below may reference images not displayed]

FINDINGS: Cardiomegaly is unchanged. Unchanged mediastinal contours. Fine
reticular opacities throughout both lungs which are more prominent
than on prior exam. Small right and possibly left pleural effusions.
No confluent airspace disease. No pneumothorax. No acute osseous
abnormalities.
IMPRESSION: Cardiomegaly with pulmonary edema and small right and possibly left
pleural effusions.

## 2021-06-28 IMAGING — DX PORTABLE CHEST - 1 VIEW
1 series · 2 of 2 positions shown · non-contrast
Comparison: 04/29/2019

CLINICAL DATA: Status post left heart catheterization

EXAM:
PORTABLE CHEST 1 VIEW

[Series 1: chest ap · 0.14mm/px · 2 of 2 slices shown]
[im 1/2]
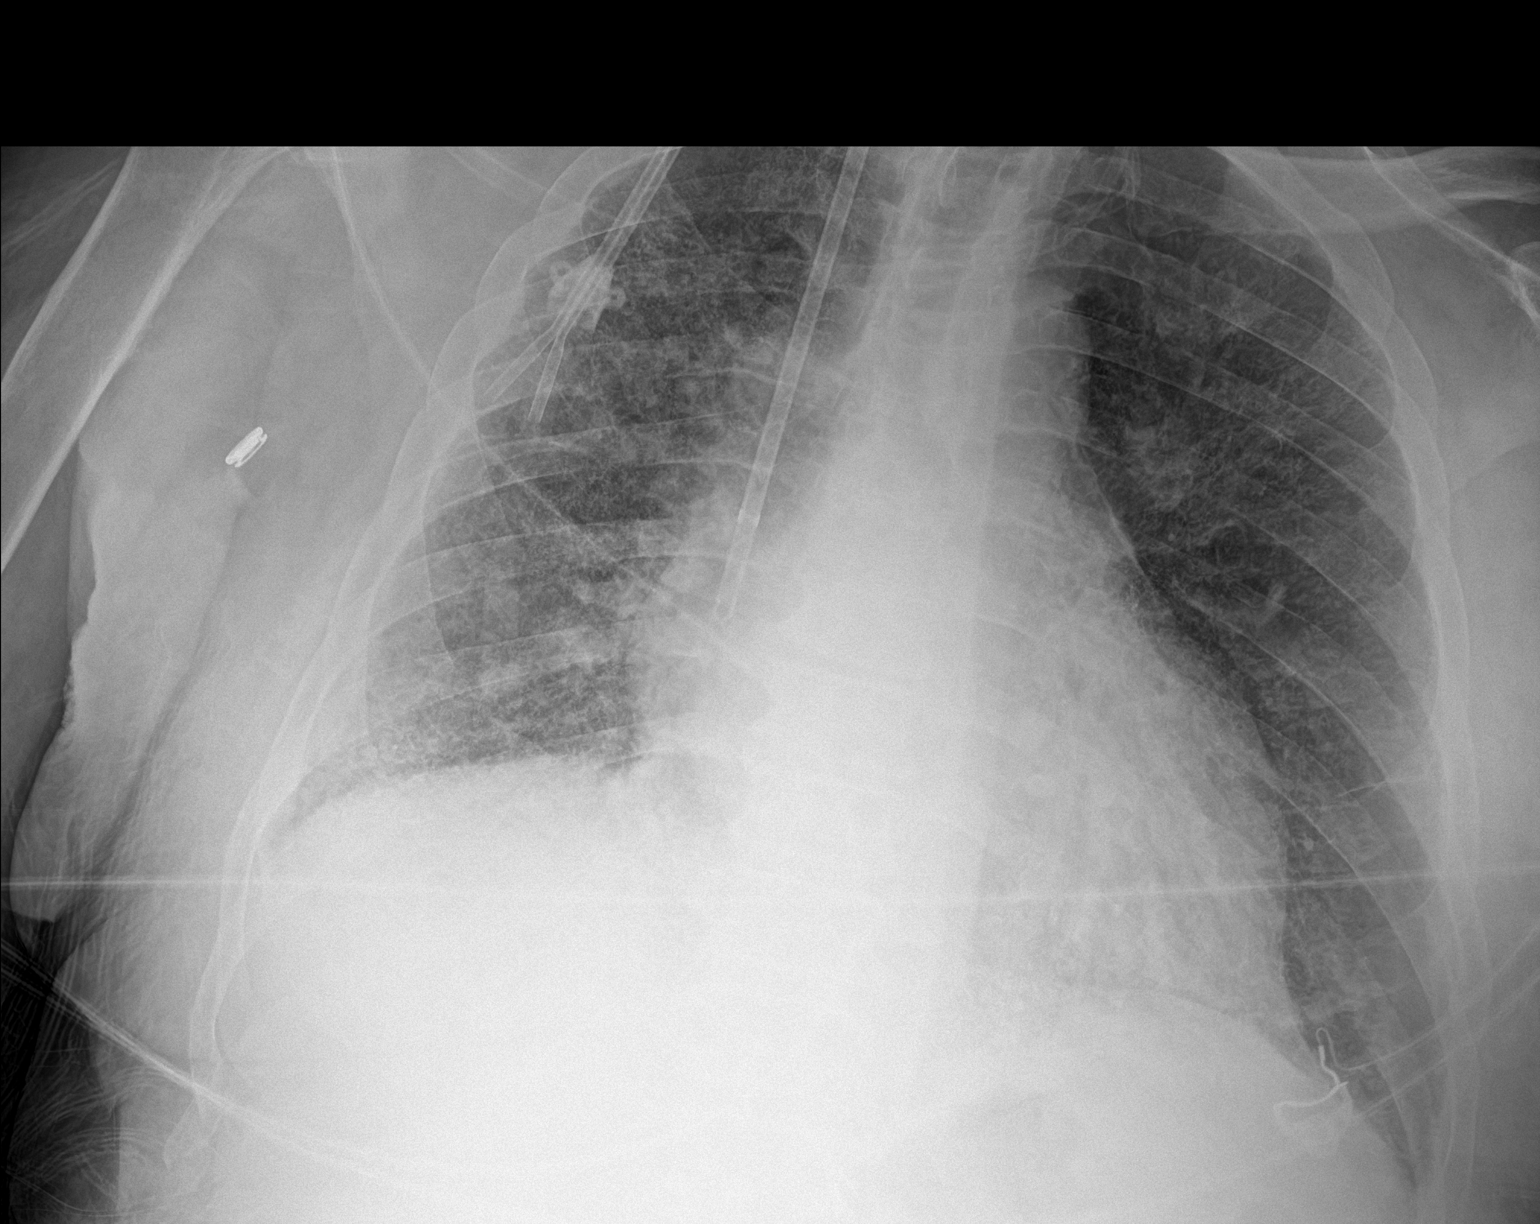
[im 2/2]
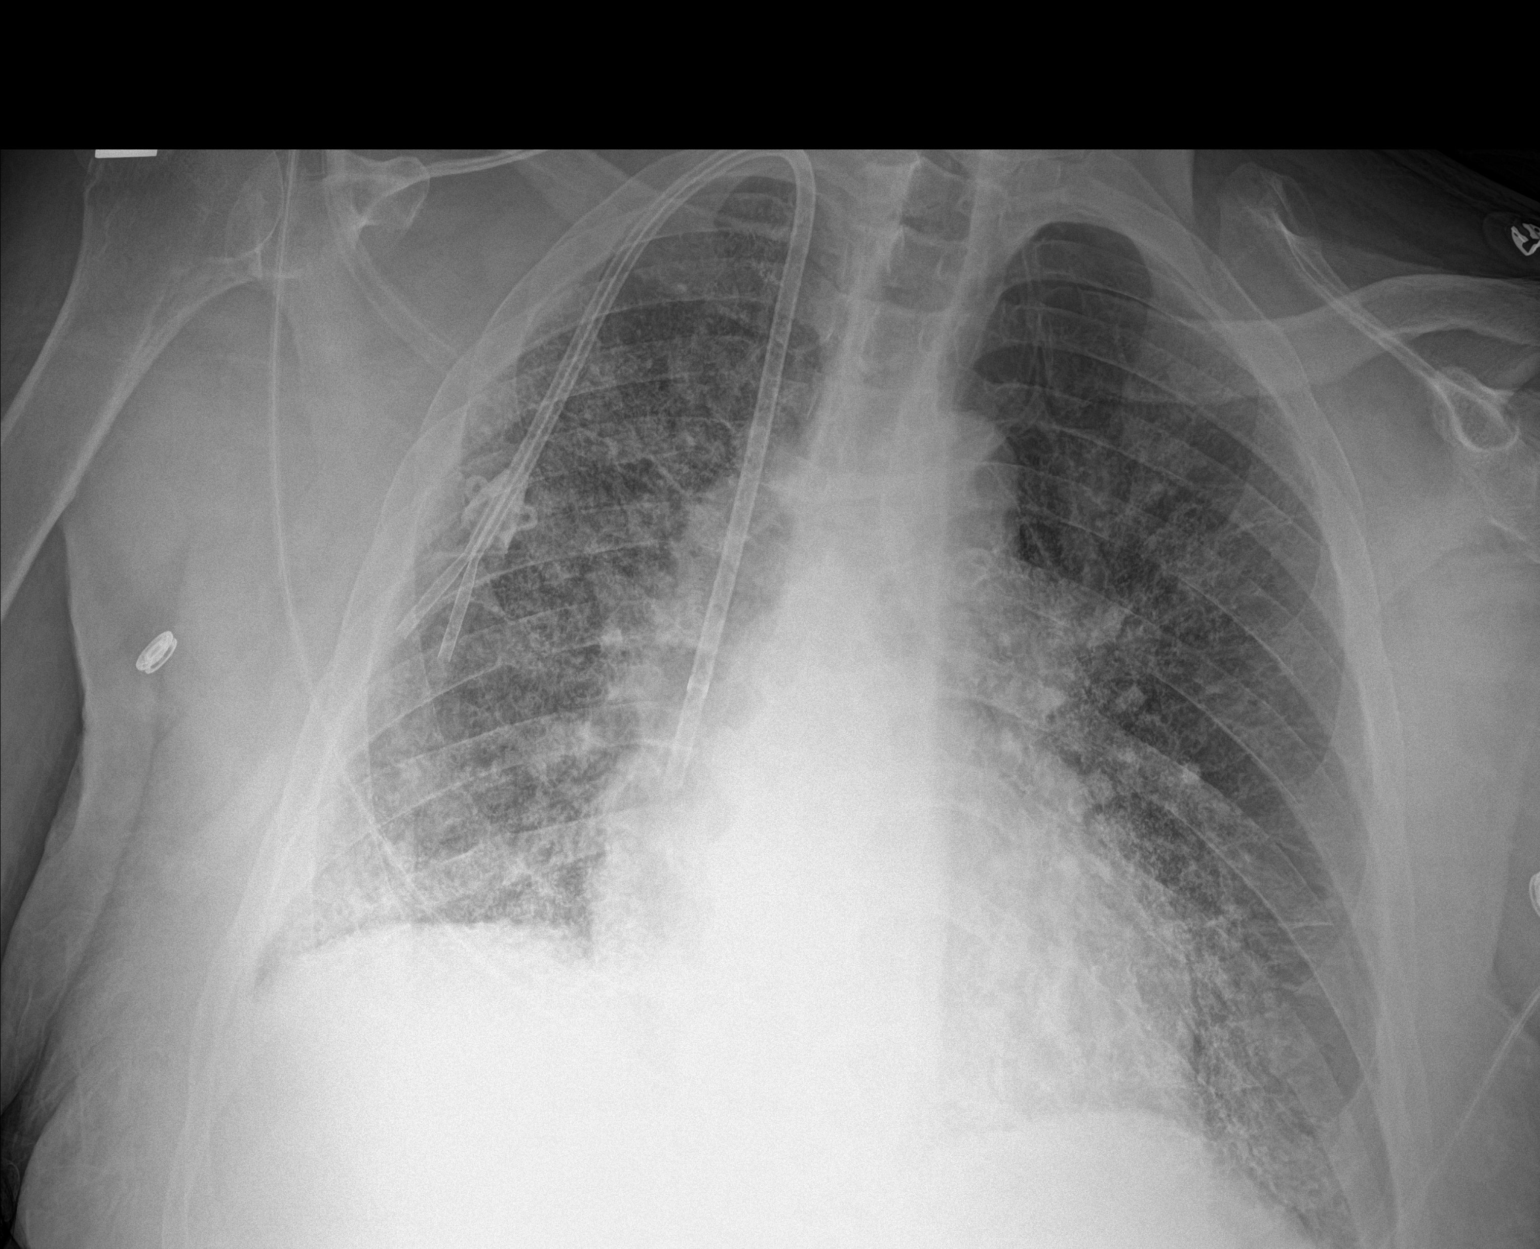

[2 of 2 positions shown; findings below may reference images not displayed]

FINDINGS: Cardiac shadow is stable. Dialysis catheter is now seen in
satisfactory position. Stable opacities are noted throughout both
lungs similar to that seen on the prior exam likely related to mild
pulmonary edema. No sizable effusion is seen. No pneumothorax is
noted.
IMPRESSION: Stable opacities bilaterally.  No new focal abnormality is seen.

## 2021-07-01 IMAGING — CR CHEST - 2 VIEW
2 series · 2 of 2 positions shown · non-contrast
Comparison: Chest radiograph dated 04/30/2019

CLINICAL DATA: 72-year-old male with history of diabetes and
hypertension presenting with pulmonary edema.

EXAM:
CHEST - 2 VIEW

[chest lat]
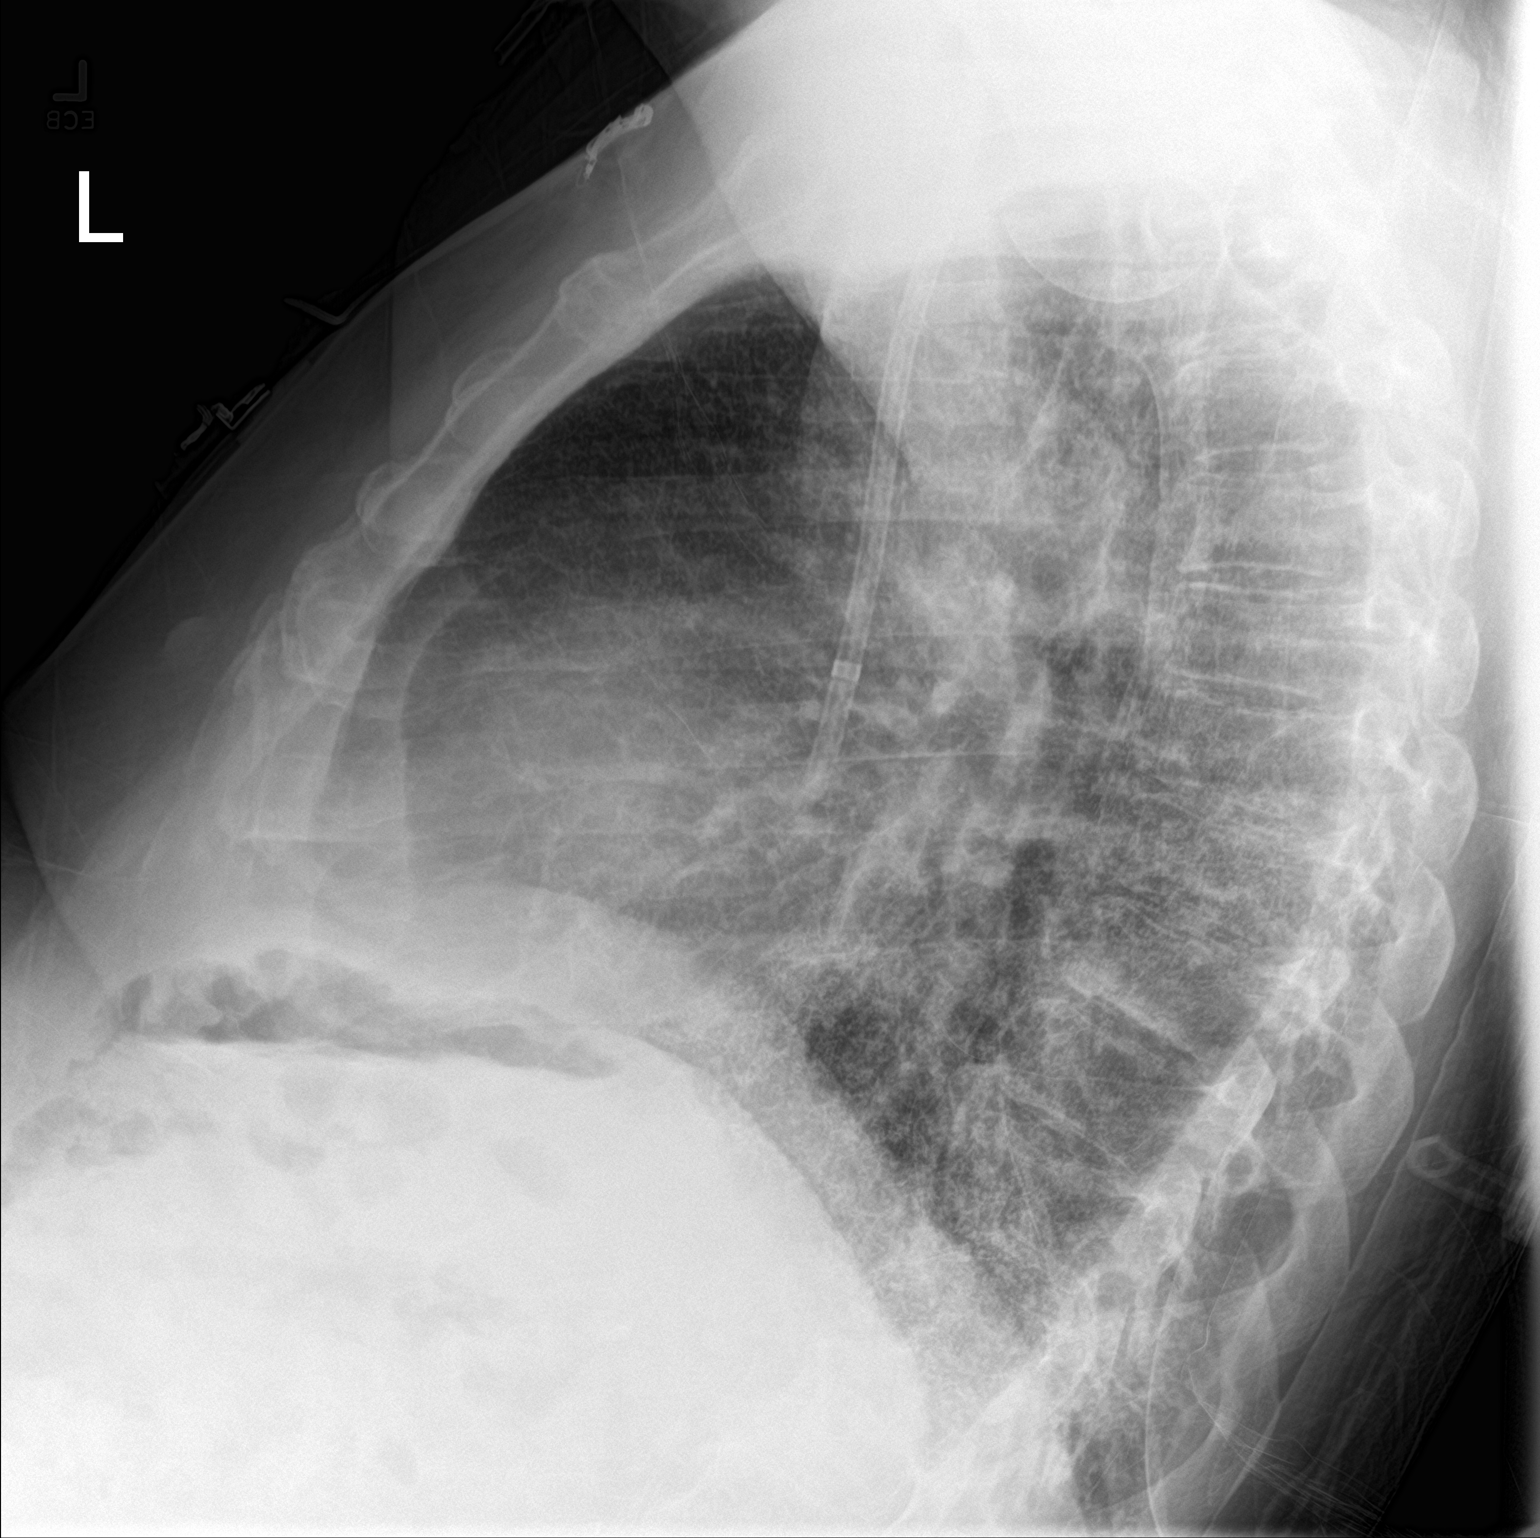

[chest ap]
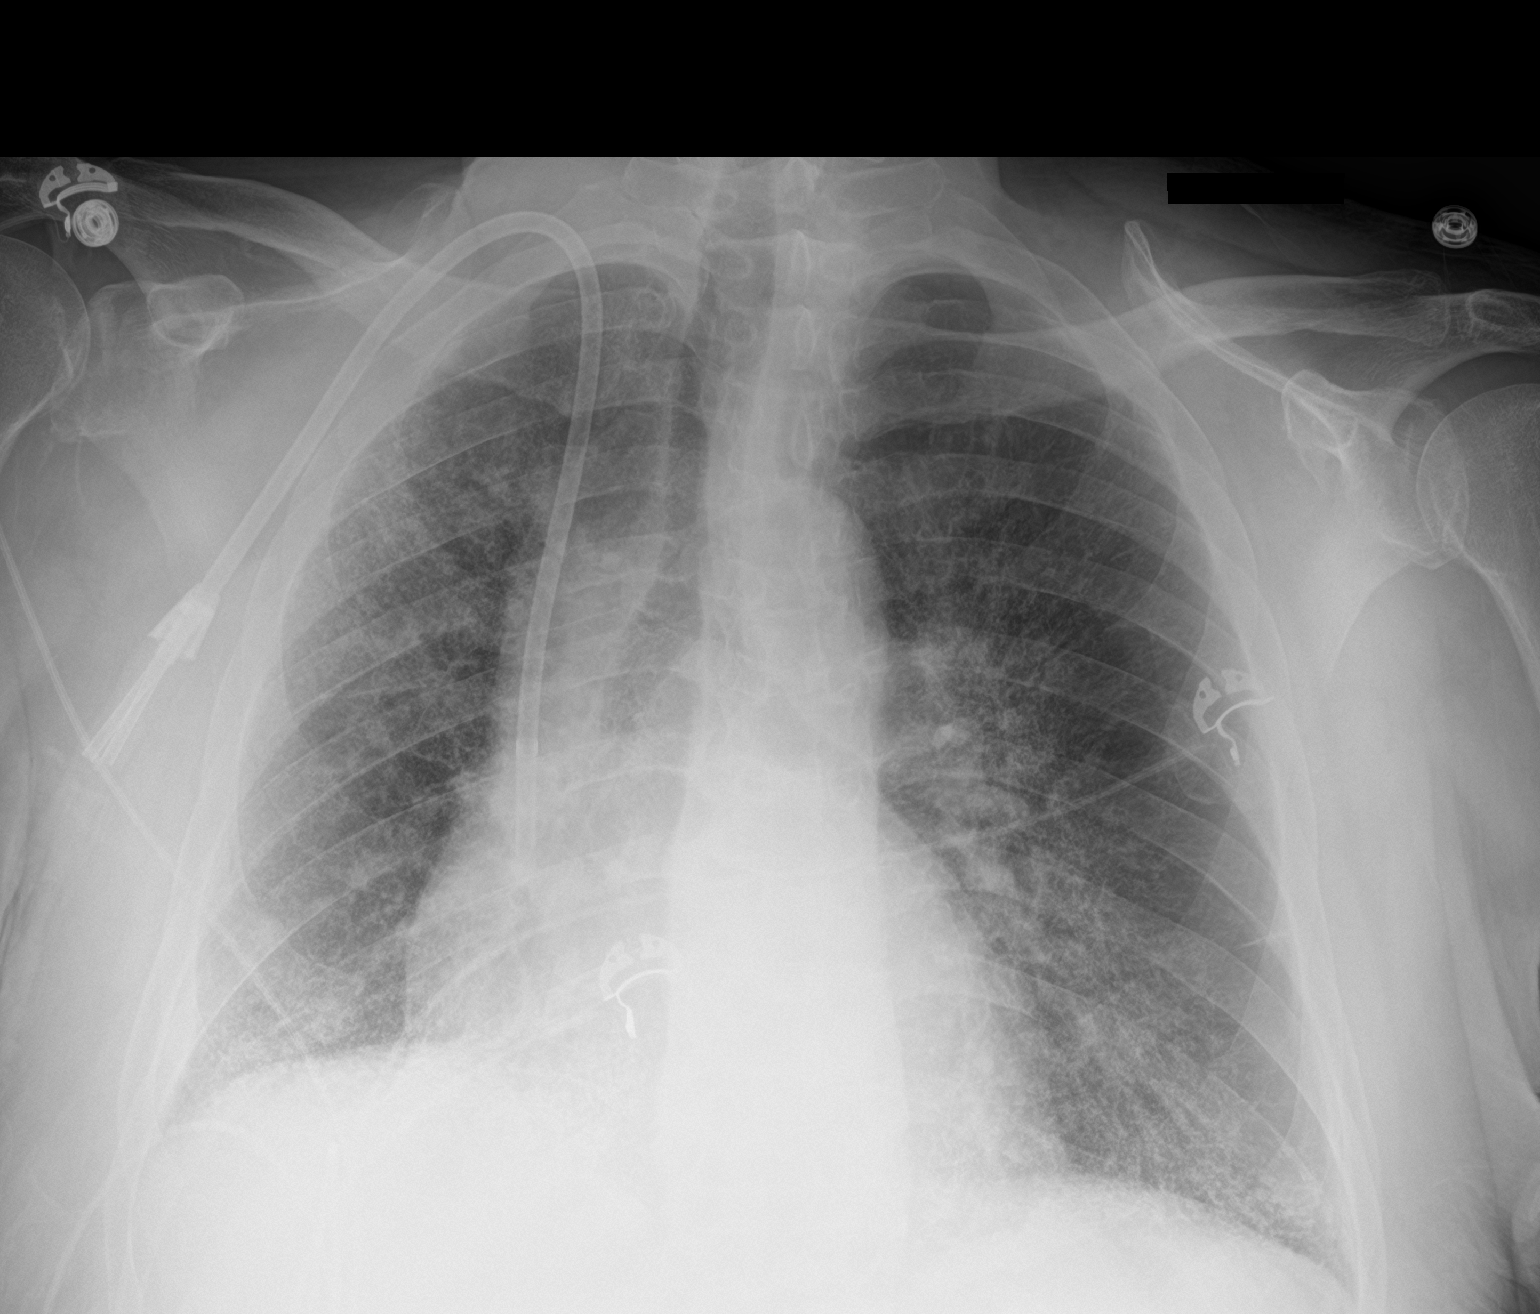

[2 of 2 positions shown; findings below may reference images not displayed]

FINDINGS: Right-sided dialysis catheter in similar position. Stable borderline
cardiomegaly. There is diffuse chronic interstitial coarsening.
There is mild vascular congestion, similar or slightly improved
since the prior radiograph. A trace right pleural effusion may be
present. No lobar consolidation or pneumothorax. No acute osseous
pathology.
IMPRESSION: 1. Mild vascular congestion, similar or slightly improved since the
prior radiograph.
2. Possible trace right pleural effusion.
